# Patient Record
Sex: Female | Born: 1955
Health system: Southern US, Community
[De-identification: ages and names within clinical notes are randomized; demographics above are authoritative.]

## PROBLEM LIST (undated history)

## (undated) DIAGNOSIS — R600 Localized edema: Secondary | ICD-10-CM

## (undated) DIAGNOSIS — R0602 Shortness of breath: Secondary | ICD-10-CM

## (undated) DIAGNOSIS — R0489 Hemorrhage from other sites in respiratory passages: Secondary | ICD-10-CM

## (undated) DIAGNOSIS — I639 Cerebral infarction, unspecified: Secondary | ICD-10-CM

## (undated) DIAGNOSIS — T7840XA Allergy, unspecified, initial encounter: Secondary | ICD-10-CM

## (undated) DIAGNOSIS — F1910 Other psychoactive substance abuse, uncomplicated: Secondary | ICD-10-CM

## (undated) DIAGNOSIS — E876 Hypokalemia: Secondary | ICD-10-CM

## (undated) DIAGNOSIS — E7800 Pure hypercholesterolemia, unspecified: Secondary | ICD-10-CM

## (undated) DIAGNOSIS — I10 Essential (primary) hypertension: Secondary | ICD-10-CM

## (undated) DIAGNOSIS — N184 Chronic kidney disease, stage 4 (severe): Secondary | ICD-10-CM

## (undated) DIAGNOSIS — R200 Anesthesia of skin: Secondary | ICD-10-CM

## (undated) DIAGNOSIS — Z794 Long term (current) use of insulin: Principal | ICD-10-CM

## (undated) DIAGNOSIS — N189 Chronic kidney disease, unspecified: Secondary | ICD-10-CM

## (undated) DIAGNOSIS — Z86010 Personal history of colonic polyps: Secondary | ICD-10-CM

## (undated) DIAGNOSIS — N179 Acute kidney failure, unspecified: Secondary | ICD-10-CM

## (undated) DIAGNOSIS — R042 Hemoptysis: Secondary | ICD-10-CM

## (undated) DIAGNOSIS — D649 Anemia, unspecified: Secondary | ICD-10-CM

## (undated) DIAGNOSIS — M3131 Wegener's granulomatosis with renal involvement: Secondary | ICD-10-CM

## (undated) DIAGNOSIS — J9601 Acute respiratory failure with hypoxia: Secondary | ICD-10-CM

## (undated) DIAGNOSIS — E785 Hyperlipidemia, unspecified: Secondary | ICD-10-CM

## (undated) DIAGNOSIS — E119 Type 2 diabetes mellitus without complications: Secondary | ICD-10-CM

## (undated) DIAGNOSIS — M318 Other specified necrotizing vasculopathies: Secondary | ICD-10-CM

## (undated) DIAGNOSIS — J449 Chronic obstructive pulmonary disease, unspecified: Secondary | ICD-10-CM

## (undated) DIAGNOSIS — L959 Vasculitis limited to the skin, unspecified: Secondary | ICD-10-CM

## (undated) DIAGNOSIS — A419 Sepsis, unspecified organism: Secondary | ICD-10-CM

## (undated) DIAGNOSIS — Z720 Tobacco use: Secondary | ICD-10-CM

## (undated) HISTORY — DX: Type 2 diabetes mellitus without complications: E11.9

## (undated) HISTORY — DX: Anesthesia of skin: R20.0

## (undated) HISTORY — DX: Vasculitis limited to the skin, unspecified: L95.9

## (undated) HISTORY — DX: Hyperlipidemia, unspecified: E78.5

## (undated) HISTORY — DX: Pneumonia, unspecified organism: J18.9

## (undated) HISTORY — DX: Chronic kidney disease, unspecified: N18.9

## (undated) HISTORY — DX: Wegener's granulomatosis with renal involvement: M31.31

## (undated) HISTORY — DX: Sepsis, unspecified organism: A41.9

## (undated) HISTORY — DX: Allergy, unspecified, initial encounter: T78.40XA

## (undated) HISTORY — DX: Cerebral infarction, unspecified: I63.9

## (undated) HISTORY — DX: Essential (primary) hypertension: I10

## (undated) HISTORY — DX: Acute respiratory failure with hypoxia: J96.01

## (undated) HISTORY — DX: Hemorrhage from other sites in respiratory passages: R04.89

## (undated) HISTORY — DX: Hypokalemia: E87.6

## (undated) HISTORY — DX: Hemoptysis: R04.2

## (undated) HISTORY — DX: Chronic kidney disease, stage 4 (severe): N18.4

## (undated) HISTORY — DX: Tobacco use: Z72.0

## (undated) HISTORY — DX: Anemia, unspecified: D64.9

## (undated) HISTORY — DX: Personal history of colonic polyps: Z86.010

## (undated) HISTORY — DX: Other specified necrotizing vasculopathies: M31.8

## (undated) HISTORY — DX: Long term (current) use of insulin: Z79.4

## (undated) HISTORY — DX: Acute kidney failure, unspecified: N17.9

## (undated) HISTORY — DX: Localized edema: R60.0

## (undated) HISTORY — DX: Chronic obstructive pulmonary disease, unspecified: J44.9

## (undated) HISTORY — DX: Other psychoactive substance abuse, uncomplicated: F19.10

## (undated) HISTORY — DX: Shortness of breath: R06.02

---

## 1983-09-11 HISTORY — PX: ABDOMINAL HYSTERECTOMY: SHX81

## 2005-01-25 ENCOUNTER — Emergency Department (HOSPITAL_COMMUNITY): Admission: EM | Admit: 2005-01-25 | Discharge: 2005-01-25 | Payer: Self-pay | Admitting: Emergency Medicine

## 2005-05-01 ENCOUNTER — Ambulatory Visit: Payer: Self-pay | Admitting: Family Medicine

## 2005-05-03 ENCOUNTER — Ambulatory Visit: Payer: Self-pay | Admitting: Family Medicine

## 2005-07-02 ENCOUNTER — Ambulatory Visit: Payer: Self-pay | Admitting: Family Medicine

## 2007-05-26 ENCOUNTER — Emergency Department (HOSPITAL_COMMUNITY): Admission: EM | Admit: 2007-05-26 | Discharge: 2007-05-26 | Payer: Self-pay | Admitting: Emergency Medicine

## 2012-07-01 ENCOUNTER — Inpatient Hospital Stay (HOSPITAL_COMMUNITY)
Admission: EM | Admit: 2012-07-01 | Discharge: 2012-07-04 | DRG: 065 | Disposition: A | Discharge status: Rehabilitation Facility | Payer: Medicaid Other | Attending: Internal Medicine | Admitting: Internal Medicine

## 2012-07-01 ENCOUNTER — Emergency Department (HOSPITAL_COMMUNITY): Payer: Medicaid Other

## 2012-07-01 ENCOUNTER — Encounter (HOSPITAL_COMMUNITY): Payer: Self-pay | Admitting: *Deleted

## 2012-07-01 DIAGNOSIS — R279 Unspecified lack of coordination: Secondary | ICD-10-CM | POA: Diagnosis present

## 2012-07-01 DIAGNOSIS — R270 Ataxia, unspecified: Secondary | ICD-10-CM

## 2012-07-01 DIAGNOSIS — R200 Anesthesia of skin: Secondary | ICD-10-CM

## 2012-07-01 DIAGNOSIS — I639 Cerebral infarction, unspecified: Secondary | ICD-10-CM

## 2012-07-01 DIAGNOSIS — Z23 Encounter for immunization: Secondary | ICD-10-CM

## 2012-07-01 DIAGNOSIS — I6350 Cerebral infarction due to unspecified occlusion or stenosis of unspecified cerebral artery: Principal | ICD-10-CM | POA: Diagnosis present

## 2012-07-01 DIAGNOSIS — R5381 Other malaise: Secondary | ICD-10-CM | POA: Diagnosis present

## 2012-07-01 DIAGNOSIS — R209 Unspecified disturbances of skin sensation: Secondary | ICD-10-CM | POA: Diagnosis present

## 2012-07-01 DIAGNOSIS — F17200 Nicotine dependence, unspecified, uncomplicated: Secondary | ICD-10-CM | POA: Diagnosis present

## 2012-07-01 DIAGNOSIS — R1310 Dysphagia, unspecified: Secondary | ICD-10-CM | POA: Diagnosis present

## 2012-07-01 DIAGNOSIS — I10 Essential (primary) hypertension: Secondary | ICD-10-CM | POA: Diagnosis present

## 2012-07-01 DIAGNOSIS — E876 Hypokalemia: Secondary | ICD-10-CM

## 2012-07-01 HISTORY — DX: Essential (primary) hypertension: I10

## 2012-07-01 LAB — CBC WITH DIFFERENTIAL/PLATELET
Basophils Absolute: 0 10*3/uL (ref 0.0–0.1)
Basophils Relative: 1 % (ref 0–1)
Eosinophils Relative: 0 % (ref 0–5)
Hemoglobin: 14.3 g/dL (ref 12.0–15.0)
Lymphocytes Relative: 25 % (ref 12–46)
MCH: 27.4 pg (ref 26.0–34.0)
MCHC: 33 g/dL (ref 30.0–36.0)
MCV: 83 fL (ref 78.0–100.0)
Monocytes Relative: 5 % (ref 3–12)
Neutro Abs: 4.5 10*3/uL (ref 1.7–7.7)
Neutrophils Relative %: 69 % (ref 43–77)
WBC: 6.5 10*3/uL (ref 4.0–10.5)

## 2012-07-01 LAB — BASIC METABOLIC PANEL
Potassium: 3.9 mEq/L (ref 3.5–5.1)
Sodium: 138 mEq/L (ref 135–145)

## 2012-07-01 MED ORDER — HYDRALAZINE HCL 25 MG PO TABS
25.0000 mg | ORAL_TABLET | Freq: Four times a day (QID) | ORAL | Status: DC
  Administered 2012-07-01 – 2012-07-04 (×7): 25 mg via ORAL
  Filled 2012-07-01 (×14): qty 1

## 2012-07-01 MED ORDER — ACETAMINOPHEN 325 MG PO TABS: 650.0000 mg | ORAL_TABLET | Freq: Four times a day (QID) | ORAL | Status: DC | PRN

## 2012-07-01 MED ORDER — METOPROLOL TARTRATE 1 MG/ML IV SOLN
5.0000 mg | Freq: Once | INTRAVENOUS | Status: AC
  Administered 2012-07-01: 5 mg via INTRAVENOUS
  Filled 2012-07-01: qty 5

## 2012-07-01 MED ORDER — INFLUENZA VIRUS VACC SPLIT PF IM SUSP
0.5000 mL | INTRAMUSCULAR | Status: AC
  Administered 2012-07-02: 0.5 mL via INTRAMUSCULAR
  Filled 2012-07-01: qty 0.5

## 2012-07-01 MED ORDER — HYDRALAZINE HCL 25 MG PO TABS: 25.0000 mg | ORAL_TABLET | Freq: Four times a day (QID) | ORAL | Status: DC

## 2012-07-01 MED ORDER — PNEUMOCOCCAL VAC POLYVALENT 25 MCG/0.5ML IJ INJ
0.5000 mL | INJECTION | INTRAMUSCULAR | Status: AC
  Administered 2012-07-02: 0.5 mL via INTRAMUSCULAR
  Filled 2012-07-01: qty 0.5

## 2012-07-01 MED ORDER — CLONIDINE HCL 0.1 MG PO TABS
0.2000 mg | ORAL_TABLET | Freq: Once | ORAL | Status: AC
  Administered 2012-07-01: 0.2 mg via ORAL
  Filled 2012-07-01: qty 2

## 2012-07-01 MED ORDER — ACETAMINOPHEN 650 MG RE SUPP: 650.0000 mg | Freq: Four times a day (QID) | RECTAL | Status: DC | PRN

## 2012-07-01 MED ORDER — SODIUM CHLORIDE 0.9 % IV SOLN
Freq: Once | INTRAVENOUS | Status: AC
  Administered 2012-07-01: 10:00:00 via INTRAVENOUS

## 2012-07-01 MED ORDER — HYDROCHLOROTHIAZIDE 25 MG PO TABS
25.0000 mg | ORAL_TABLET | Freq: Every day | ORAL | Status: DC
  Administered 2012-07-01 – 2012-07-02 (×2): 25 mg via ORAL
  Filled 2012-07-01 (×2): qty 1

## 2012-07-01 MED ORDER — SODIUM CHLORIDE 0.9 % IJ SOLN
3.0000 mL | Freq: Two times a day (BID) | INTRAMUSCULAR | Status: DC
  Administered 2012-07-01 – 2012-07-04 (×5): 3 mL via INTRAVENOUS

## 2012-07-01 MED ORDER — AMLODIPINE BESYLATE 5 MG PO TABS
5.0000 mg | ORAL_TABLET | Freq: Every day | ORAL | Status: DC
  Administered 2012-07-01 – 2012-07-02 (×2): 5 mg via ORAL
  Filled 2012-07-01 (×2): qty 1

## 2012-07-01 MED ORDER — HYDRALAZINE HCL 25 MG PO TABS
25.0000 mg | ORAL_TABLET | Freq: Four times a day (QID) | ORAL | Status: DC
  Filled 2012-07-01 (×3): qty 1

## 2012-07-01 NOTE — ED Notes (Signed)
Report given to tess, rn on floor.

## 2012-07-01 NOTE — ED Notes (Signed)
md at bedside  Pt alert and oriented x4. Respirations even and unlabored, bilateral symmetrical rise and fall of chest. Skin warm and dry. In no acute distress. Denies needs.   

## 2012-07-01 NOTE — ED Notes (Signed)
[id number] Expected date:<BR> Expected time:<BR> Means of arrival:<BR> Comments:<BR> hypertension

## 2012-07-01 NOTE — ED Notes (Addendum)
Per ptar, pt is from home. Pt reports around 0230 this am, started having left sided facial numbness/tingling on mandible area. Also having left sided weakness, unable to stand and in bed pt leaning to left. Reports her head "pounds and spins". Has not taken medication for HTN in ten years.  Cough x5 days.  Denies ear pain.  Upon arrival to ED pt began vomiting.   Pt husband arrived and reports that her speech has changed and is slow.

## 2012-07-01 NOTE — ED Notes (Signed)
rn and tech attempted to ambulate pt. Pt unable to walk on her own. Still leans to left.

## 2012-07-01 NOTE — ED Notes (Signed)
Pt alert and oriented x4. Respirations even and unlabored, bilateral symmetrical rise and fall of chest. Skin warm and dry. In no acute distress. Denies needs.   

## 2012-07-01 NOTE — ED Provider Notes (Signed)
History     CSN: [id number]  Arrival date & time 07/01/12  0917   First MD Initiated Contact with Patient 07/01/12 [phone]      Chief Complaint  Patient presents with  . Hypertension  . Weakness    (Consider location/radiation/quality/duration/timing/severity/associated sxs/prior treatment) HPI.... level V caveat for urgent need for intervention.  Patient has has had untreated hypertension for several years. Blood pressure home today was 218/100. She has left-sided headache along with leaning to the left when attempting to ambulate. Also vomited. She smokes cigarettes and drinks alcohol. No primary care relationship.  Certain movements makes symptoms worse. Severity is moderate to severe.  Past Medical History  Diagnosis Date  . Hypertension     History reviewed. No pertinent past surgical history.  History reviewed. No pertinent family history.  History  Substance Use Topics  . Smoking status: Current Every Day Smoker -- 0.1 packs/day for 40 years    Types: Cigarettes  . Smokeless tobacco: Not on file  . Alcohol Use: Yes     occasionally    OB History    Grav Para Term Preterm Abortions TAB SAB Ect Mult Living                  Review of Systems  Unable to perform ROS   Allergies  Review of patient's allergies indicates no known allergies.  Home Medications  No current outpatient prescriptions on file.  BP 210/90  Pulse 48  Temp 98 F (36.7 C) (Oral)  Resp 19  SpO2 100%  Physical Exam  Nursing note and vitals reviewed. Constitutional: She is oriented to person, place, and time. She appears well-developed and well-nourished.  HENT:  Head: Normocephalic and atraumatic.  Eyes: Conjunctivae normal and EOM are normal. Pupils are equal, round, and reactive to light.  Neck: Normal range of motion. Neck supple.  Cardiovascular: Normal rate, regular rhythm and normal heart sounds.   Pulmonary/Chest: Effort normal and breath sounds normal.  Abdominal: Soft.  Bowel sounds are normal.  Musculoskeletal: Normal range of motion.  Neurological: She is alert and oriented to person, place, and time.       Leans to the left when attempting to ambulate  Skin: Skin is warm and dry.  Psychiatric: She has a normal mood and affect.    ED Course  Procedures (including critical care time)  Labs Reviewed  CBC WITH DIFFERENTIAL - Abnormal; Notable for the following:    RBC 5.22 (*)     All other components within normal limits  BASIC METABOLIC PANEL - Abnormal; Notable for the following:    Glucose, Bld 170 (*)     All other components within normal limits   Ct Head Wo Contrast  07/01/2012  *RADIOLOGY REPORT*  Clinical Data: Left parietal headaches  CT HEAD WITHOUT CONTRAST  Technique:  Contiguous axial images were obtained from the base of the skull through the vertex without contrast.  Comparison: None.  Findings: Ventricle size is normal.  Negative for infarct or mass. Negative for intracranial hemorrhage or fluid collection.  Calvarium is intact.  Sinusitis with air-fluid levels in the maxillary sinus bilaterally. Mucosal edema and fluid in the sphenoid sinus bilaterally.  There is mucosal thickening in the ethmoid sinuses.  Mastoid sinuses are clear.  IMPRESSION: Normal CT of the brain.  Sinusitis with multiple air-fluid levels in the sinuses.   Original Report Authenticated By: Truett Perna, M.D.      No diagnosis found.    Date: 07/01/2012  Rate: 48  Rhythm: sinus bradycardia  QRS Axis: normal  Intervals: normal  ST/T Wave abnormalities: normal  Conduction Disutrbances:none  Narrative Interpretation:   Old EKG Reviewed: none available    MDM   Patient is alert but very ataxic,   especially leaning to the left.  She's been hypertensive for years.  CT scan is normal. However patient could have had a stroke. Admit to hospital      Nat Christen, MD 07/01/12 1421

## 2012-07-01 NOTE — H&P (Signed)
[facility] History and Physical  Catherine Aguilar [id number] DOB: August 25, 1956 DOA: 07/01/2012  Referring physician: ER physician PCP: No primary provider on file.   Chief Complaint:   HPI:  56 year old female with history of untreated high blood pressure who presented with left sided headache started prior to this admission associated with 1 episode of vomiting. Blood pressure in ED was 218/100 and it responded to catapres and metoprolol  Assessment and Plan:  Active Problems:  Accelerated hypertension - likely due to longstanding untreated hypertension - BP 214/67 on admission and with management in ED BP now 168/87 which is an acceptable decrease; patient was given catapres as well as metoprolol; we will not continue this regimen due to risk of rebound hypertension associated with non compliance and after metoprolol patient developed bradycardia with HR in 40's - we will start Norvasc 5 mg daily, Hctz 25 mg daily and hydralazine 25 mg Q 6 hours PO - this regimen may be changed depending how BP ranges - we will also obtain 2 D ECHO  - admit to telemetry  Code Status: Full Family Communication: Pt at bedside Disposition Plan: Admit for further evaluation; observation on telemtry  Leisa Lenz, MD  [facility] Pager [phone]  If 7PM-7AM, please contact night-coverage [email or web address] Password [email or web address] 07/01/2012, 2:27 PM   Review of Systems:  Constitutional: Negative for fever, chills and malaise/fatigue. Negative for diaphoresis.  HENT: Negative for hearing loss, ear pain, nosebleeds, congestion, sore throat, neck pain, tinnitus and ear discharge.   Eyes: Negative for blurred vision, double vision, photophobia, pain, discharge and redness.  Respiratory: Negative for cough, hemoptysis, sputum production, shortness of breath, wheezing and stridor.   Cardiovascular: Negative for chest pain, palpitations, orthopnea, claudication and leg swelling.  Gastrointestinal: positive for  nausea, vomiting and negative for abdominal pain. Negative for heartburn, constipation, blood in stool and melena.  Genitourinary: Negative for dysuria, urgency, frequency, hematuria and flank pain.  Musculoskeletal: Negative for myalgias, back pain, joint pain and falls.  Skin: Negative for itching and rash.  Neurological: Negative for dizziness and weakness. Negative for tingling, tremors, sensory change, speech change, focal weakness, loss of consciousness Endo/Heme/Allergies: Negative for environmental allergies and polydipsia. Does not bruise/bleed easily.  Psychiatric/Behavioral: Negative for suicidal ideas. The patient is not nervous/anxious.      Past Medical History  Diagnosis Date  . Hypertension    Past Surgical History  Procedure Date  . Abdominal hysterectomy 1985   Social History:  reports that she has been smoking Cigarettes.  She has a 4 pack-year smoking history. She has never used smokeless tobacco. She reports that she drinks alcohol. She reports that she does not use illicit drugs.  No Known Allergies  Family History: htn in mother  Prior to Admission medications   Not on File   Physical Exam: Filed Vitals:   07/01/12 0935 07/01/12 1015 07/01/12 1100 07/01/12 1203  BP: 214/97 210/90 171/92 168/87  Pulse: 50 48 45 44  Temp:      TempSrc:      Resp: 18 19 16 14   SpO2: 100%  99% 99%    Physical Exam  Constitutional: Appears well-developed and well-nourished. No distress.  HENT: Normocephalic. External right and left ear normal. Oropharynx is clear and moist.  Eyes: Conjunctivae and EOM are normal. PERRLA, no scleral icterus.  Neck: Normal ROM. Neck supple. No JVD. No tracheal deviation. No thyromegaly.  CVS: RRR, S1/S2 +, no murmurs, no gallops, no carotid bruit.  Pulmonary: Effort and breath sounds  normal, no stridor, rhonchi, wheezes, rales.  Abdominal: Soft. BS +,  no distension, tenderness, rebound or guarding.  Musculoskeletal: Normal range of  motion. No edema and no tenderness.  Lymphadenopathy: No lymphadenopathy noted, cervical, inguinal. Neuro: Alert. Normal reflexes, muscle tone coordination. No cranial nerve deficit. Skin: Skin is warm and dry. No rash noted. Not diaphoretic. No erythema. No pallor.  Psychiatric: Normal mood and affect. Behavior, judgment, thought content normal.   Labs on Admission:  Basic Metabolic Panel:  Lab [id number] 0950  NA 138  K 3.9  CL 102  CO2 20  GLUCOSE 170*  BUN 17  CREATININE 0.75  CALCIUM 9.8  MG --  PHOS --   Liver Function Tests: No results found for this basename: AST:5,ALT:5,ALKPHOS:5,BILITOT:5,PROT:5,ALBUMIN:5 in the last 168 hours No results found for this basename: LIPASE:5,AMYLASE:5 in the last 168 hours No results found for this basename: AMMONIA:5 in the last 168 hours CBC:  Lab 07/01/12 0950  WBC 6.5  NEUTROABS 4.5  HGB 14.3  HCT 43.3  MCV 83.0  PLT 340   Cardiac Enzymes: No results found for this basename: CKTOTAL:5,CKMB:5,CKMBINDEX:5,TROPONINI:5 in the last 168 hours BNP: No components found with this basename: POCBNP:5 CBG: No results found for this basename: GLUCAP:5 in the last 168 hours  Radiological Exams on Admission: Ct Head Wo Contrast  07/01/2012  *RADIOLOGY REPORT*  Clinical Data: Left parietal headaches  CT HEAD WITHOUT CONTRAST  Technique:  Contiguous axial images were obtained from the base of the skull through the vertex without contrast.  Comparison: None.  Findings: Ventricle size is normal.  Negative for infarct or mass. Negative for intracranial hemorrhage or fluid collection.  Calvarium is intact.  Sinusitis with air-fluid levels in the maxillary sinus bilaterally. Mucosal edema and fluid in the sphenoid sinus bilaterally.  There is mucosal thickening in the ethmoid sinuses.  Mastoid sinuses are clear.  IMPRESSION: Normal CT of the brain.  Sinusitis with multiple air-fluid levels in the sinuses.   Original Report Authenticated By: Truett Perna, M.D.     EKG: Normal sinus rhythm, no ST/T wave changes  Time spent: 60 minutes

## 2012-07-01 NOTE — ED Notes (Signed)
First attempt to call report to tess, rn. rn will call back

## 2012-07-01 NOTE — ED Notes (Signed)
Pt to CT

## 2012-07-02 ENCOUNTER — Observation Stay (HOSPITAL_COMMUNITY): Payer: Medicaid Other

## 2012-07-02 DIAGNOSIS — E876 Hypokalemia: Secondary | ICD-10-CM | POA: Diagnosis not present

## 2012-07-02 DIAGNOSIS — R209 Unspecified disturbances of skin sensation: Secondary | ICD-10-CM

## 2012-07-02 DIAGNOSIS — R200 Anesthesia of skin: Secondary | ICD-10-CM

## 2012-07-02 DIAGNOSIS — R269 Unspecified abnormalities of gait and mobility: Secondary | ICD-10-CM

## 2012-07-02 DIAGNOSIS — R279 Unspecified lack of coordination: Secondary | ICD-10-CM

## 2012-07-02 HISTORY — DX: Hypokalemia: E87.6

## 2012-07-02 HISTORY — DX: Anesthesia of skin: R20.0

## 2012-07-02 LAB — CBC
MCH: 27 pg (ref 26.0–34.0)
MCHC: 32.8 g/dL (ref 30.0–36.0)
Platelets: 396 10*3/uL (ref 150–400)
RBC: 4.81 MIL/uL (ref 3.87–5.11)
RDW: 14.2 % (ref 11.5–15.5)

## 2012-07-02 LAB — COMPREHENSIVE METABOLIC PANEL
ALT: 16 U/L (ref 0–35)
AST: 13 U/L (ref 0–37)
Alkaline Phosphatase: 67 U/L (ref 39–117)
CO2: 25 mEq/L (ref 19–32)
Chloride: 99 mEq/L (ref 96–112)
Creatinine, Ser: 0.84 mg/dL (ref 0.50–1.10)
GFR calc non Af Amer: 76 mL/min — ABNORMAL LOW (ref >90)
Total Bilirubin: 0.3 mg/dL (ref 0.3–1.2)

## 2012-07-02 LAB — GLUCOSE, CAPILLARY: Glucose-Capillary: 115 mg/dL — ABNORMAL HIGH (ref 70–99)

## 2012-07-02 MED ORDER — POTASSIUM CHLORIDE CRYS ER 20 MEQ PO TBCR
40.0000 meq | EXTENDED_RELEASE_TABLET | ORAL | Status: AC
  Administered 2012-07-02: 40 meq via ORAL
  Filled 2012-07-02 (×2): qty 2

## 2012-07-02 MED ORDER — SODIUM CHLORIDE 0.9 % IV SOLN
INTRAVENOUS | Status: DC
  Administered 2012-07-02 – 2012-07-03 (×2): via INTRAVENOUS

## 2012-07-02 MED ORDER — POTASSIUM CHLORIDE 10 MEQ/100ML IV SOLN
10.0000 meq | INTRAVENOUS | Status: AC
  Administered 2012-07-02 – 2012-07-03 (×4): 10 meq via INTRAVENOUS
  Filled 2012-07-02 (×4): qty 100

## 2012-07-02 MED ORDER — HYDRALAZINE HCL 20 MG/ML IJ SOLN
10.0000 mg | Freq: Four times a day (QID) | INTRAMUSCULAR | Status: DC | PRN
  Administered 2012-07-04: 10 mg via INTRAVENOUS
  Filled 2012-07-02: qty 1

## 2012-07-02 MED ORDER — GADOBENATE DIMEGLUMINE 529 MG/ML IV SOLN
15.0000 mL | Freq: Once | INTRAVENOUS | Status: AC | PRN
  Administered 2012-07-02: 15 mL via INTRAVENOUS

## 2012-07-02 MED ORDER — ASPIRIN 325 MG PO TABS
325.0000 mg | ORAL_TABLET | Freq: Every day | ORAL | Status: DC
  Filled 2012-07-02: qty 1

## 2012-07-02 MED ORDER — ENOXAPARIN SODIUM 40 MG/0.4ML ~~LOC~~ SOLN
40.0000 mg | SUBCUTANEOUS | Status: DC
  Administered 2012-07-02 – 2012-07-04 (×3): 40 mg via SUBCUTANEOUS
  Filled 2012-07-02 (×3): qty 0.4

## 2012-07-02 MED ORDER — ASPIRIN 300 MG RE SUPP
325.0000 mg | Freq: Every day | RECTAL | Status: DC
  Administered 2012-07-02: 300 mg via RECTAL
  Filled 2012-07-02 (×4): qty 1

## 2012-07-02 NOTE — Consult Note (Signed)
Reason for Consult: Stroke Referring Physician: Bonnielee Haff  CC: Difficulty walking  History is obtained from: Patient  HPI: Catherine Aguilar is an 56 y.o. female with a history of left-sided headache and unsteadiness that began around 2 AM on the 22nd. She began vomiting later that morning, which prompted her to come in to the ER with the first note being around 9:15 AM. She was found to have a grossly elevated blood pressure and was therefore admitted for accelerated hypertension. He has been started on Norvasc, hydrochlorothiazide, hydralazine. Her current blood pressure is [id number] systolic. She has been as low as 0000000 systolic, without worsening of her symptoms.   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 She states that when she tries to walk, she consistently falls to the left   ROS: An 11 point ROS was performed and is negative except as noted in the HPI.  Past Medical History  Diagnosis Date  . Hypertension     Family History: No history of strokes that she is aware of  Social History: Tob: Does smoke  Exam: Current vital signs: BP 156/69  Pulse 65  Temp 98.2 F (36.8 C) (Oral)  Resp 18  Ht 5\' 9"  (1.753 m)  Wt 75 kg (165 lb 5.5 oz)  BMI 24.42 kg/m2  SpO2 100% Vital signs in last 24 hours: Temp:  [98.1 F (36.7 C)-98.7 F (37.1 C)] 98.2 F (36.8 C) (10/23 1406) Pulse Rate:  [45-65] 65  (10/23 1406) Resp:  [18-20] 18  (10/23 1406) BP: (128-203)/(67-87) 156/69 mmHg (10/23 1406) SpO2:  [100 %] 100 % (10/23 1406) Weight:  [75 kg (165 lb 5.5 oz)] 75 kg (165 lb 5.5 oz) (10/23 0451)  General: In bed, no apparent distress CV: Regular rate and rhythm Mental Status: Patient is awake, alert, oriented to person, place, month, year, and situation. Patient is able to add 7+, able to spell world backwards without difficulty. Patient is able to give a clear and coherent history. Cranial Nerves: II: Visual Fields are full. Pupils are equal, round,  and reactive to light.  Discs are difficult to visualiz. III,IV, VI: EOMI without ptosis or diploplia. She has saccadic smooth pursuit towards the right.  V,VII: Facial movement is symmetric. Facial sensation is decreased to temperature on the left face VIII: hearing is intact to voice X: Uvula elevates symmetrically XI: Shoulder shrug is symmetric. XII: tongue is midline without atrophy or fasciculations.  Motor: Tone is normal. Bulk is normal. 5/5 strength was present in all four extremities.  Sensory: Sensation is diminished in the right arm and right leg, intact in the left arm and left leg Deep Tendon Reflexes: 3+ and symmetric in the biceps and patellae.  Plantars: Toes are downgoing bilaterally.  Cerebellar: FNF with gross dysmetria on left, intact on right.  Gait: On standing, patient immediately falls towards the left.   I have reviewed labs in epic and the results pertinent to this consultation are: CMP< low K, otherwise unremarkable.   I have reviewed the images obtained: MRI brain, small left lateral medullary infarct  Impression: 56 yo F with sudden onset headache, ataxia, and nausea consistent with lateral medullary infarct. Given the unilateral headache at onset, I would perform vascular imaging to rule out dissection, evaluate for atherosclerotic burden.   Recommendations: 1. HgbA1c, fasting lipid panel 2. MRA of the brain without contrast, MRA neck with contrast 3. PT consult, OT consult, Speech consult 4. Echocardiogram results pending 5. Prophylactic therapy-Antiplatelet med:  Aspirin - dose 300mg .  6. Risk factor modification 7. Telemetry monitoring 8. Frequent neuro checks  Roland Rack, MD [facility] [phone]  If 7pm- 7am, please page neurology on call at [phone].

## 2012-07-02 NOTE — Progress Notes (Signed)
Talked to patient about follow up medical care; A packet was given to patient with information on health care providers that are accepting new patient, the [employer], [facility] and [facility]; [facility] or [facility] to [facility] her prescriptions and is aware of the $4.00 medications assistance program.  Patient is independent prior to admission, is going to [employer] part time. Patient stated that she could pay for her medication/ prescriptions as long as they are on the $4.00 list. B Tamera Punt RN,BSN,MHA

## 2012-07-02 NOTE — Progress Notes (Signed)
VASCULAR LAB PRELIMINARY  PRELIMINARY  PRELIMINARY  PRELIMINARY  Carotid duplex  completed.    Preliminary report:  Bilateral:  No evidence of hemodynamically significant internal carotid artery stenosis.   Vertebral artery flow is antegrade.      Chong January, RVT 07/02/2012, 12:57 PM

## 2012-07-02 NOTE — Progress Notes (Signed)
Patient has very unsteady gait issue. Stands at the bedside to get onto bedside commode and simply falls into RN standing beside her. Patient verbalizes understanding to not get out of bed without assistance and bed alarm is set. Will continue to monitor. Lemar Livings

## 2012-07-02 NOTE — Progress Notes (Addendum)
PCP: Does not have a PCP  Brief HPI: 56 year old female with history of untreated high blood pressure who presented with left sided headache started prior to this admission associated with 1 episode of vomiting. Blood pressure in ED was 218/100 and it responded to catapres and metoprolol. Patient also had left sided numbness and weakness.  Past medical history:  Past Medical History  Diagnosis Date  . Hypertension     Consultants: None  Procedures: None  Subjective: Patient still with numbness to the left face. Denies any weakness or numbness in left arm or leg. Feels unsteady with walking. Denies new difficulty with swallowing.  Objective: Vital signs in last 24 hours: Temp:  [98.1 F (36.7 C)-98.7 F (37.1 C)] 98.1 F (36.7 C) (10/23 0451) Pulse Rate:  [45-50] 50  (10/23 0451) Resp:  [20] 20  (10/23 0451) BP: (128-203)/(67-87) 128/67 mmHg (10/23 1000) SpO2:  [98 %-100 %] 100 % (10/23 0451) Weight:  [75 kg (165 lb 5.5 oz)-75.932 kg (167 lb 6.4 oz)] 75 kg (165 lb 5.5 oz) (10/23 0451) Weight change:  Last BM Date: 07/01/12  Intake/Output from previous day: 10/22 0701 - 10/23 0700 In: 170.3 [I.V.:170.3] Out: -  Intake/Output this shift: Total I/O In: -  Out: 800 [Urine:800]  General appearance: alert, cooperative, appears stated age and no distress Head: Normocephalic, without obvious abnormality, atraumatic Eyes: conjunctivae/corneas clear. PERRL, EOM's intact.  Neck: no adenopathy, no carotid bruit, no JVD, supple, symmetrical, trachea midline and thyroid not enlarged, symmetric, no tenderness/mass/nodules Resp: clear to auscultation bilaterally Cardio: regular rate and rhythm, S1, S2 normal, no murmur, click, rub or gallop GI: soft, non-tender; bowel sounds normal; no masses,  no organomegaly Extremities: extremities normal, atraumatic, no cyanosis or edema Pulses: 2+ and symmetric Skin: Skin color, texture, turgor normal. No rashes or lesions Lymph nodes:  Cervical, supraclavicular, and axillary nodes normal. Neurologic: Alert and oriented x 3. Decreased sensation left face. Strength equal bilaterally.  Lab Results:  Basename 07/02/12 0525 07/01/12 0950  WBC 7.4 6.5  HGB 13.0 14.3  HCT 39.6 43.3  PLT 396 340   BMET  Basename 07/02/12 0525 07/01/12 0950  NA 135 138  K 3.0* 3.9  CL 99 102  CO2 25 20  GLUCOSE 105* 170*  BUN 11 17  CREATININE 0.84 0.75  CALCIUM 9.7 9.8  ALT 16 --    Studies/Results: Ct Head Wo Contrast  07/01/2012  *RADIOLOGY REPORT*  Clinical Data: Left parietal headaches  CT HEAD WITHOUT CONTRAST  Technique:  Contiguous axial images were obtained from the base of the skull through the vertex without contrast.  Comparison: None.  Findings: Ventricle size is normal.  Negative for infarct or mass. Negative for intracranial hemorrhage or fluid collection.  Calvarium is intact.  Sinusitis with air-fluid levels in the maxillary sinus bilaterally. Mucosal edema and fluid in the sphenoid sinus bilaterally.  There is mucosal thickening in the ethmoid sinuses.  Mastoid sinuses are clear.  IMPRESSION: Normal CT of the brain.  Sinusitis with multiple air-fluid levels in the sinuses.   Original Report Authenticated By: Truett Perna, M.D.     Medications:  Scheduled:    . hydrALAZINE  25 mg Oral Q6H  . hydrochlorothiazide  25 mg Oral Daily  . influenza  inactive virus vaccine  0.5 mL Intramuscular Tomorrow-1000  . pneumococcal 23 valent vaccine  0.5 mL Intramuscular Tomorrow-1000  . potassium chloride  40 mEq Oral Q4H  . sodium chloride  3 mL Intravenous Q12H  .  DISCONTD: amLODipine  5 mg Oral Daily  . DISCONTD: hydrALAZINE  25 mg Oral Q6H  . DISCONTD: hydrALAZINE  25 mg Oral Q6H   Continuous:  [id number], acetaminophen  Assessment/Plan:  Active Problems:  Accelerated hypertension  Hypokalemia  Left facial numbness    Accelerated hypertension  Was likely due to longstanding untreated hypertension. BP  is now better. Adjust medications. Await 2 D ECHO   Left Facial Numbness  Could be related to accelerated HTN. But will proceed with MRI to rule out CVA. Get PT/OT eval due to unsteady gait. Has not taken aspirin since January.  Hypokalemia Replete orally  Code Status Full   Family Communication Discussed with patient and husband at bedside.  Disposition Plan Will likely go home when ready for discharge  DVT Prophylaxis Enoxaparin   LOS: 1 day   [facility] Pager [phone] 07/02/2012, 12:28 PM   MRI reveals Acute CVA. Discussed with Dr. Doy Mince and she will see the patient. Will initiate Aspirin. Don't aggressively control BP. SLP for swallow eval. Carotids and ECHO.  Timmi Devora 4:50 PM

## 2012-07-02 NOTE — Progress Notes (Signed)
  Echocardiogram 2D Echocardiogram has been performed.  Catherine Aguilar 07/02/2012, 11:27 AM

## 2012-07-03 ENCOUNTER — Encounter (HOSPITAL_COMMUNITY): Payer: Self-pay | Admitting: Physical Medicine and Rehabilitation

## 2012-07-03 ENCOUNTER — Observation Stay (HOSPITAL_COMMUNITY): Payer: Medicaid Other

## 2012-07-03 DIAGNOSIS — I639 Cerebral infarction, unspecified: Secondary | ICD-10-CM | POA: Diagnosis present

## 2012-07-03 DIAGNOSIS — I6350 Cerebral infarction due to unspecified occlusion or stenosis of unspecified cerebral artery: Principal | ICD-10-CM

## 2012-07-03 HISTORY — DX: Cerebral infarction, unspecified: I63.9

## 2012-07-03 LAB — GLUCOSE, CAPILLARY: Glucose-Capillary: 101 mg/dL — ABNORMAL HIGH (ref 70–99)

## 2012-07-03 LAB — LIPID PANEL
HDL: 39 mg/dL — ABNORMAL LOW (ref >39)
LDL Cholesterol: 98 mg/dL (ref 0–99)
VLDL: 24 mg/dL (ref 0–40)

## 2012-07-03 LAB — BASIC METABOLIC PANEL
BUN: 10 mg/dL (ref 6–23)
Calcium: 9.7 mg/dL (ref 8.4–10.5)
Creatinine, Ser: 0.9 mg/dL (ref 0.50–1.10)
GFR calc Af Amer: 81 mL/min — ABNORMAL LOW (ref >90)
GFR calc non Af Amer: 70 mL/min — ABNORMAL LOW (ref >90)

## 2012-07-03 LAB — HEMOGLOBIN A1C: Hgb A1c MFr Bld: 6.4 % — ABNORMAL HIGH (ref <5.7)

## 2012-07-03 MED ORDER — FLUTICASONE PROPIONATE 50 MCG/ACT NA SUSP
1.0000 | Freq: Every day | NASAL | Status: DC
  Administered 2012-07-03 – 2012-07-04 (×2): 1 via NASAL
  Filled 2012-07-03: qty 16

## 2012-07-03 MED ORDER — ASPIRIN 325 MG PO TABS
325.0000 mg | ORAL_TABLET | Freq: Every day | ORAL | Status: DC
  Administered 2012-07-03 – 2012-07-04 (×2): 325 mg via ORAL
  Filled 2012-07-03 (×2): qty 1

## 2012-07-03 MED ORDER — SIMVASTATIN 20 MG PO TABS
20.0000 mg | ORAL_TABLET | Freq: Every day | ORAL | Status: DC
  Administered 2012-07-03: 20 mg via ORAL
  Filled 2012-07-03 (×2): qty 1

## 2012-07-03 NOTE — Evaluation (Addendum)
Occupational Therapy Evaluation Patient Details Name: Catherine Aguilar MRN: [id number] DOB: [birth year] Today's Date: 07/03/2012 Time: [id number] OT Time Calculation (min): 24 min  OT Assessment / Plan / Recommendation Clinical Impression  56 yo female admitted with accelerated hypertension. 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 Pt was independent prior to admission. She displays decreased safety judgement, impulsivity, decreased awareness of need for assist, balance deficits during ADL and will benefit from skilled OT services to improve ADL independence and safety for next venue of care.     OT Assessment  Patient needs continued OT Services    Follow Up Recommendations  Inpatient Rehab    Barriers to Discharge      Equipment Recommendations  Rolling walker with 5" wheels;3 in 1 bedside comode    Recommendations for Other Services Rehab consult  Frequency  Min 2X/week    Precautions / Restrictions Precautions Precautions: Fall Restrictions Weight Bearing Restrictions: No        ADL  Eating/Feeding: Simulated;Independent Where Assessed - Eating/Feeding: Chair Grooming: Simulated;Wash/dry hands;Set up Where Assessed - Grooming: Supported sitting Upper Body Bathing: Simulated;Chest;Right arm;Left arm;Abdomen;Supervision/safety;Other (comment);Set up (supervision for safety as pt impulsive) Where Assessed - Upper Body Bathing: Unsupported sitting Lower Body Bathing: Simulated;Minimal assistance;Other (comment) (for balance, pt with LOB to L) Where Assessed - Lower Body Bathing: Supported sit to stand Upper Body Dressing: Simulated;Supervision/safety;Set up Where Assessed - Upper Body Dressing: Unsupported sitting Lower Body Dressing: Simulated;Minimal assistance;Other (comment) (as above for balance. ) Where Assessed - Lower Body Dressing: Supported sit to stand Toilet Transfer: Performed;Minimal assistance Toilet Transfer Method: Stand Engineer, civil (consulting): Bedside commode;Other (comment) (pt about to get up to [facility] on her own. ) Toileting - Water quality scientist and Hygiene: Simulated;Minimal assistance Where Assessed - Best boy and Hygiene: Sit to stand from 3-in-1 or toilet Tub/Shower Transfer Method: Not assessed Equipment Used: Rolling walker ADL Comments: Seen with PT. Initially didnt use an assistive device and pt required min to mod assist for balance with LOB to the L. Used RW for functional mobility and pt with improved balance requiring min assist. Pt is impulsive and moves quickly. She states she is used to doing things "fast" and was attempting to get up to [facility] by herself as therapists entered the room . Emphasized the need for her to call for assist due to her balance deficits. Pt requires min assist for dynamic balance tasks such as pulling up and down mesh underwear.     OT Diagnosis: Ataxia;Cognitive deficits;Generalized weakness  OT Problem List: Impaired balance (sitting and/or standing);Decreased cognition;Decreased safety awareness;Decreased knowledge of use of DME or AE OT Treatment Interventions: Self-care/ADL training;Therapeutic activities;DME and/or AE instruction;Patient/family education;Balance training   OT Goals Acute Rehab OT Goals OT Goal Formulation: With patient Time For Goal Achievement: 07/17/12 Potential to Achieve Goals: Good ADL Goals Pt Will Perform Grooming: with supervision;Standing at sink;Other (comment) (3 tasks) ADL Goal: Grooming - Progress: Goal set today Pt Will Perform Upper Body Bathing: with supervision;Other (comment);Unsupported;Sitting, edge of bed;Sitting, chair (own setup with assistive device) ADL Goal: Upper Body Bathing - Progress: Goal set today Pt Will Perform Lower Body Bathing: with supervision;Sit to stand from chair;Sit to stand from bed;Other (comment) (own setup) ADL Goal: Lower Body Bathing - Progress: Goal set today Pt Will Perform Upper Body  Dressing: with supervision;Unsupported;Sitting, bed;Sitting, chair;Other (comment) (own setup) ADL Goal: Upper Body Dressing - Progress: Goal set today Pt Will Perform Lower Body Dressing: with supervision;Sit to stand from chair;Sit to  stand from bed;Other (comment) (own setup) ADL Goal: Lower Body Dressing - Progress: Goal set today Pt Will Transfer to Toilet: with supervision;Ambulation;with DME;3-in-1 ADL Goal: Toilet Transfer - Progress: Goal set today Pt Will Perform Toileting - Clothing Manipulation: with supervision;Standing ADL Goal: Toileting - Clothing Manipulation - Progress: Goal set today Pt Will Perform Toileting - Hygiene: with supervision;Standing at 3-in-1/toilet ADL Goal: Toileting - Hygiene - Progress: Goal set today Additional ADL Goal #1: Pt will demonstrate good safety awareness during ADL session with only 1 verbal cue during session (awareness of trip hazards, recognize need for assist, etc) ADL Goal: Additional Goal #1 - Progress: Goal set today  Visit Information  Last OT Received On: 07/03/12 Assistance Needed: +1 PT/OT Co-Evaluation/Treatment: Yes    Subjective Data  Subjective: I need to go to the bathroom Patient Stated Goal: wants to be independent again   Prior Functioning     Home Living Lives With: Spouse Available Help at Discharge: Other (Comment) (spouse on disability) Type of Home: House Home Access: Stairs to enter [employer] of Steps: 1 Home Layout: One level Home Adaptive Equipment: None Prior Function Level of Independence: Independent Able to Take Stairs?: Yes Communication Communication: No difficulties Dominant Hand: Right         Vision/Perception  Pt reports vision is "alittle blurry" compared to baseline but able to read clock, etc. Pt states she had some diplopia but today reports none.   Cognition  Overall Cognitive Status: Impaired Area of Impairment: Safety/judgement Arousal/Alertness:  Awake/alert Orientation Level: Appears intact for tasks assessed Behavior During Session: [facility] for tasks performed Safety/Judgement: Decreased awareness of safety precautions;Decreased safety judgement for tasks assessed;Impulsive;Decreased awareness of need for assistance Cognition - Other Comments: Pt trying to get up on her own when therapists entered room and observed multiple cords under feet and SCDs still connected. Emphasized need to call for assist. Pt is impulsive and moves quickly.     Extremity/Trunk Assessment Right Upper Extremity Assessment RUE ROM/Strength/Tone: WFL for tasks assessed RUE Sensation: WFL - Light Touch RUE Coordination: WFL - gross/fine motor Left Upper Extremity Assessment LUE ROM/Strength/Tone: WFL for tasks assessed LUE Sensation: WFL - Light Touch LUE Coordination: Deficits LUE Coordination Deficits: pt able to touch each finger to thumb with good speed but note dysmetria with finger to nose on L with some decreased accuracy noted. Better with practice.  Right Lower Extremity Assessment RLE ROM/Strength/Tone: [facility] for tasks assessed RLE Sensation: Deficits RLE Sensation Deficits: Impaired perception of body in space. Pt attempts to compensate with lateral head/neck flexion to R side and by leaning to R side, at times overcompensating.  RLE Coordination: Deficits RLE Coordination Deficits: Ataxic gait. Left Lower Extremity Assessment LLE ROM/Strength/Tone: WFL for tasks assessed LLE Sensation: Deficits LLE Sensation Deficits: Impaired perception of body in space. Pt attempts to compensate with lateral head/neck flexion to R side and by leaning to R side, at times overcompensating.  LLE Coordination: Deficits LLE Coordination Deficits: Ataxic gait. Trunk Assessment Trunk Assessment: Normal     Mobility Bed Mobility Bed Mobility: Supine to Sit Supine to Sit: 5: Supervision Details for Bed Mobility Assistance: VCs safety. Pt is impulsive.   Transfers Sit to Stand: 4: Min assist;From bed;With upper extremity assist;From chair/3-in-1 Stand to Sit: 4: Min assist;To chair/3-in-1;With armrests;With upper extremity assist Details for Transfer Assistance: VCs safety, technique, hand placement. Assist to rise stabilize, control descent. Pt very unsteady, reaching out with both hands for objects to hold onto. LOB to L side multiple  times.      Shoulder Instructions     Exercise     Balance Balance Balance Assessed: Yes Static Standing Balance Static Standing - Balance Support: No upper extremity supported Static Standing - Level of Assistance: 4: Min assist Static Standing - Comment/# of Minutes: Had pt stand EO/EC-able to maintain without LOB with close supervision assist. Performed external perturbations, mulitidirectional-close supervision assist. Pt attempted to stand statically with narrow BOS-pt unable to achieve position without 1 hand assist. Unable to maintain-LOB to L side repeatedly, requiring Min assist to prevent fall.  Dynamic Standing Balance Dynamic Standing - Level of Assistance: 4: Min assist;Other (comment) (to pull up and down mesh underwear)   End of Session OT - End of Session Equipment Utilized During Treatment: Gait belt Activity Tolerance: Patient tolerated treatment well Patient left: in chair;with call bell/phone within reach;with chair alarm set  GO Functional Assessment Tool Used: clinical judgement Functional Limitation: Self care Self Care Current Status [id number]): At least 20 percent but less than 40 percent impaired, limited or restricted Self Care Goal Status [id number]): At least 1 percent but less than 20 percent impaired, limited or restricted   Jules Schick [id number] 07/03/2012, 1:01 PM

## 2012-07-03 NOTE — Progress Notes (Addendum)
Speech Language Pathology Dysphagia Treatment Patient Details Name: Catherine Aguilar MRN: [id number] DOB: Jan 20, 1956 Today's Date: 07/03/2012 Time: [id number] SLP Time Calculation (min): 26 min  Assessment / Plan / Recommendation Clinical Impression  Pt seen for education regarding MBS findings, compensatory strategy review and  initiation of exercises to improve hyolaryngeal elevation.  In addition, SLP administered reflux symptom index (RSI) to pt - Her score was 31 of 45 indicating high likelihood of pt experiencing laryngopharyngeal reflux (LPR) that may contriburte to dysphagia symptoms.  Provided list of reflux precautions for pt to incorporate and RSI for rescreening monthly.  SLP demonstrated Shaker swallow exercise and modified hyolaryngeal elevation exercise for pt to complete.  Pt returned demonstration with minimal verbal cues for Shaker and moderate visual and verbal cues for modified version.  Pt re-stated swallow precautions for airway protection.   SLP to follow for dysphagia treatment and management.    Note:  Given pt with apparent awareness, safety judgement difficulties with OT/PT, pt may benefit from cog-ling evaluation/tx with SLP to help maximize safety.  MD please order if you agree.  Thanks.      Diet Recommendation  Initiate / Change Diet: Dysphagia 3 (mechanical soft);Thin liquid    SLP Plan Continue with current plan of care   Pertinent Vitals/Pain Pt reports good intake of lunch without difficulties.     Swallowing Goals  SLP Swallowing Goals Goal #3: Pt will complete hyolaryngeal elevation exercises to improve airway protection while eating swallowing with modified independence.    General Temperature Spikes Noted: No Respiratory Status: Room air Behavior/Cognition: Alert;Cooperative;Pleasant mood Oral Cavity - Dentition: Adequate natural dentition;Poor condition Patient Positioning: Upright in bed    Dysphagia Treatment Treatment focused on:  Patient/family/caregiver education Family/Caregiver Educated: pt Treatment Methods/Modalities: Shaker exercise;Differential diagnosis Patient observed directly with PO's: No Reason PO's not observed: Other (comment) (education session- ) Feeding: Able to feed self (intermittent supervison recommended)   [place], [place] [facility] SLP [phone]

## 2012-07-03 NOTE — Evaluation (Signed)
Physical Therapy Evaluation Patient Details Name: Catherine Aguilar MRN: [id number] DOB: Jan 23, 1956 Today's Date: 07/03/2012 Time: [id number] PT Time Calculation (min): 24 min  PT Assessment / Plan / Recommendation Clinical Impression  56 yo female admitted with accelerated hypertension. 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 Pt was independent prior to admission. On eval pt demonstrates gait and balance deficits. Requires Min-Mod assist for ambulation. Pt demonstrates high risk for falls as well as impaired safety awareness and impulsivity. Feel pt would benefit greatly from CIR consult. Pt will need continued rehab to maximize independence and safety with functional mobility.     PT Assessment  Patient needs continued PT services    Follow Up Recommendations  Post acute inpatient    Does the patient have the potential to tolerate intense rehabilitation   Yes, Recommend IP Rehab Screening  Barriers to Discharge        Equipment Recommendations  Rolling walker with 5" wheels    Recommendations for Other Services OT consult   Frequency Min 4X/week    Precautions / Restrictions Precautions Precautions: Fall Restrictions Weight Bearing Restrictions: No   Pertinent Vitals/Pain Pt denies      Mobility  Bed Mobility Bed Mobility: Supine to Sit Supine to Sit: 5: Supervision Details for Bed Mobility Assistance: VCs safety. Pt is impulsive.  Transfers Transfers: Sit to Stand;Stand to [employer] Transfers Sit to Stand: 4: Min assist;From bed;With upper extremity assist Stand to Sit: 4: Min assist;To chair/3-in-1;With armrests;With upper extremity assist Details for Transfer Assistance: VCs safety, technique, hand placement. Assist to rise stabilize, control descent. Pt very unsteady, reaching out with both hands for objects to hold onto. LOB to L side multiple times.  Ambulation/Gait Ambulation/Gait Assistance: 3: Mod assist;4: Min assist Ambulation Distance  (Feet): 200 Feet Assistive device: Rolling walker;None Ambulation/Gait Assistance Details: Attempted ambulation without assistive device-Mod assist with repeated LOB to L side. Had pt ambulate with RW-Min A with intermittent scissoring, LOB to L side. Assist to stabilize throughout ambulation and for maneuvering with RW. VCs safety, pacing, distance/position from RW.  Gait Pattern: Step-through pattern;Ataxic;Scissoring;Decreased stride length;Wide base of support    Shoulder Instructions     Exercises     PT Diagnosis: Difficulty walking;Abnormality of gait  PT Problem List: Decreased balance;Decreased mobility;Decreased coordination;Decreased safety awareness;Decreased knowledge of use of DME PT Treatment Interventions: DME instruction;Gait training;Stair training;Functional mobility training;Therapeutic activities;Balance training;Therapeutic exercise;Patient/family education   PT Goals Acute Rehab PT Goals PT Goal Formulation: With patient Time For Goal Achievement: 07/17/12 Potential to Achieve Goals: Good Pt will go Supine/Side to Sit: with modified independence PT Goal: Supine/Side to Sit - Progress: Goal set today Pt will go Sit to Supine/Side: with modified independence PT Goal: Sit to Supine/Side - Progress: Goal set today Pt will go Sit to Stand: with supervision PT Goal: Sit to Stand - Progress: Goal set today Pt will Transfer Bed to Chair/Chair to Bed: with supervision PT Transfer Goal: Bed to Chair/Chair to Bed - Progress: Goal set today Pt will Ambulate: >150 feet;with supervision;with least restrictive assistive device PT Goal: Ambulate - Progress: Goal set today Pt will Go Up / Down Stairs: 1-2 stairs;with supervision;with least restrictive assistive device PT Goal: Up/Down Stairs - Progress: Goal set today  Visit Information  Last PT Received On: 07/03/12 Assistance Needed: +2 PT/OT Co-Evaluation/Treatment: Yes    Subjective Data  Subjective: "I was independent.  I gotta get back home" Patient Stated Goal: Regain independence. Home   Prior Functioning  Home Living Lives  With: Spouse Type of Home: House Home Access: Stairs to enter [employer] of Steps: 1 Home Layout: One level Home Adaptive Equipment: None Prior Function Level of Independence: Independent Able to Take Stairs?: Yes Communication Communication: No difficulties Dominant Hand: Right    Cognition  Overall Cognitive Status: Impaired Area of Impairment: Safety/judgement Arousal/Alertness: Awake/alert Orientation Level: Appears intact for tasks assessed Behavior During Session: [facility] for tasks performed Safety/Judgement: Impulsive;Decreased awareness of need for assistance;Decreased safety judgement for tasks assessed    Extremity/Trunk Assessment Right Lower Extremity Assessment RLE ROM/Strength/Tone: [facility] for tasks assessed RLE Sensation: Deficits RLE Sensation Deficits: Impaired perception of body in space. Pt attempts to compensate with lateral head/neck flexion to R side and by leaning to R side, at times overcompensating.  RLE Coordination: Deficits RLE Coordination Deficits: Ataxic gait. Left Lower Extremity Assessment LLE ROM/Strength/Tone: WFL for tasks assessed LLE Sensation: Deficits LLE Sensation Deficits: Impaired perception of body in space. Pt attempts to compensate with lateral head/neck flexion to R side and by leaning to R side, at times overcompensating.  LLE Coordination: Deficits LLE Coordination Deficits: Ataxic gait. Trunk Assessment Trunk Assessment: Normal   Balance Balance Balance Assessed: Yes Static Standing Balance Static Standing - Balance Support: No upper extremity supported Static Standing - Level of Assistance: 4: Min assist Static Standing - Comment/# of Minutes: Had pt stand EO/EC-able to maintain without LOB with close supervision assist. Performed external perturbations, mulitidirectional-close supervision assist. Pt attempted  to stand statically with narrow BOS-pt unable to achieve position without 1 hand assist. Unable to maintain-LOB to L side repeatedly, requiring Min assist to prevent fall.   End of Session PT - End of Session Equipment Utilized During Treatment: Gait belt Activity Tolerance: Patient tolerated treatment well Patient left: in chair;with call bell/phone within reach;with chair alarm set  GP Functional Assessment Tool Used: Clinical judgement Functional Limitation: Mobility: Walking and moving around Mobility: Walking and Moving Around Current Status [id number]): At least 20 percent but less than 40 percent impaired, limited or restricted Mobility: Walking and Moving Around Goal Status [phone]): At least 1 percent but less than 20 percent impaired, limited or restricted Mobility: Walking and Moving Around Discharge Status [phone]): At least 1 percent but less than 20 percent impaired, limited or restricted   [place] [facility] 07/03/2012, 11:37 AM [id number]

## 2012-07-03 NOTE — Consult Note (Signed)
Physical Medicine and Rehabilitation Consult Reason for Consult: Difficulty walking, headache. Referring Physician:  Dr. Manfred Arch.    HPI: Catherine Aguilar is a 56 y.o. female with history of untreated HTN who was admitted on 07/01/12 with history of left sided HA and unsteadiness. BP at admission 218/100. CT head negative. 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 2 D echo done revealing EF 60-65% and no wall abnormalities. Carotid dopplers without ICA stenosis.  Neurology consulted and recommended full work up as well as MRA neck to rule out dissection and ASA for CVA prophylaxis. MRA head/neck done revealing hypoplastic posterior circulation, small right PICA and left PICA not visualized, full study limited due to patient's uncooperativeness. MBS done revealing mild dysphagia and patient started on D3, thin liquids. Therapy evaluations done today and patient noted to be impulsive and  have gait/ balance deficits.  MD, therapy team recommending CIR.  Reportedly independent prior to admission. Husband lives at home he is on disability Review of Systems  HENT: Negative for hearing loss and neck pain.   Eyes: Positive for blurred vision and double vision.       Visual deficits much improved.   Respiratory: Negative for sputum production and shortness of breath.   Cardiovascular: Negative for chest pain and palpitations.  Gastrointestinal: Negative for heartburn, nausea, vomiting and abdominal pain.  Musculoskeletal: Negative for myalgias.  Neurological: Positive for dizziness (with quick movements and with turns to left lateral field.). Negative for focal weakness and headaches.  Psychiatric/Behavioral: Positive for memory loss. Negative for depression. The patient is not nervous/anxious.    Past Medical History  Diagnosis Date  . Hypertension     stopped taking medications 10 years ago   Past Surgical History  Procedure Date  . Abdominal hysterectomy 1985   Family History  Problem  Relation Age of Onset  . Cancer Father   . Heart attack Mother     Social History:  Married. 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 She reports that she has been smoking Cigarettes- a pack every 2 weeks since age 80.   She has a 4 pack-year smoking history. She has never used smokeless tobacco. She reports that she drinks alcohol--3 shots of gin and 2-- 40 ounce beer on weekends. . She reports that she does not use illicit drugs. Husband disabled due to back problems.  Allergies: No Known Allergies  No prescriptions prior to admission    Home: Home Living Lives With: Spouse Available Help at Discharge: Other (Comment) (spouse on disability) Type of Home: House Home Access: Stairs to enter [employer] of Steps: 1 Home Layout: One level Home Adaptive Equipment: None  Functional History: Prior Function Able to Take Stairs?: Yes Functional Status:  Mobility: Bed Mobility Bed Mobility: Supine to Sit Supine to Sit: 5: Supervision Transfers Transfers: Sit to Stand;Stand to Sit;Stand Pivot Transfers Sit to Stand: 4: Min assist;From bed;With upper extremity assist;From chair/3-in-1 Stand to Sit: 4: Min assist;To chair/3-in-1;With armrests;With upper extremity assist Ambulation/Gait Ambulation/Gait Assistance: 3: Mod assist;4: Min assist Ambulation Distance (Feet): 200 Feet Assistive device: Rolling walker;None Ambulation/Gait Assistance Details: Attempted ambulation without assistive device-Mod assist with repeated LOB to L side. Had pt ambulate with RW-Min A with intermittent scissoring, LOB to L side. Assist to stabilize throughout ambulation and for maneuvering with RW. VCs safety, pacing, distance/position from RW.  Gait Pattern: Step-through pattern;Ataxic;Scissoring;Decreased stride length;Wide base of support    ADL: ADL Eating/Feeding: Simulated;Independent Where  Assessed - Eating/Feeding:  Chair Grooming: Simulated;Wash/dry hands;Set up Where Assessed - Grooming: Supported sitting Upper Body Bathing: Simulated;Chest;Right arm;Left arm;Abdomen;Supervision/safety;Other (comment);Set up (supervision for safety as pt impulsive) Where Assessed - Upper Body Bathing: Unsupported sitting Lower Body Bathing: Simulated;Minimal assistance;Other (comment) (for balance, pt with LOB to L) Where Assessed - Lower Body Bathing: Supported sit to stand Upper Body Dressing: Simulated;Supervision/safety;Set up Where Assessed - Upper Body Dressing: Unsupported sitting Lower Body Dressing: Simulated;Minimal assistance;Other (comment) (as above for balance. ) Where Assessed - Lower Body Dressing: Supported sit to stand Toilet Transfer: Performed;Minimal assistance Toilet Transfer Method: Stand Ecologist: Bedside commode;Other (comment) (pt about to get up to [facility] on her own. ) Tub/Shower Transfer Method: Not assessed Equipment Used: Rolling walker ADL Comments: Seen with PT. Initially didnt use an assistive device and pt required min to mod assist for balance with LOB to the L. Used RW for functional mobility and pt with improved balance requiring min assist. Pt is impulsive and moves quickly. She states she is used to doing things "fast" and was attempting to get up to [facility] by herself as therapists entered the room . Emphasized the need for her to call for assist due to her balance deficits. Pt requires min assist for dynamic balance tasks such as pulling up and down mesh underwear.   Cognition: Cognition Arousal/Alertness: Awake/alert Orientation Level: Oriented X4 Cognition Overall Cognitive Status: Impaired Area of Impairment: Safety/judgement Arousal/Alertness: Awake/alert Orientation Level: Appears intact for tasks assessed Behavior During Session: [facility] for tasks performed Safety/Judgement: Decreased awareness of safety precautions;Decreased safety judgement for tasks  assessed;Impulsive;Decreased awareness of need for assistance Cognition - Other Comments: Pt trying to get up on her own when therapists entered room and observed multiple cords under feet and SCDs still connected. Emphasized need to call for assist. Pt is impulsive and moves quickly.   Blood pressure 162/88, pulse 54, temperature 97.3 F (36.3 C), temperature source Oral, resp. rate 18, height 5\' 9"  (1.753 m), weight 75 kg (165 lb 5.5 oz), SpO2 100.00%. Physical Exam  Nursing note and vitals reviewed. Constitutional: She is oriented to person, place, and time. She appears well-developed and well-nourished.  HENT:  Head: Normocephalic and atraumatic.  Eyes: Conjunctivae normal are normal. Pupils are equal, round, and reactive to light.       Horizontal nystagmus to left lateral field.   Neck: Normal range of motion.  Cardiovascular: Normal rate and regular rhythm.   Pulmonary/Chest: Effort normal and breath sounds normal.  Abdominal: Soft. Bowel sounds are normal.  Musculoskeletal: She exhibits no edema and no tenderness.  Neurological: She is alert and oriented to person, place, and time. A cranial nerve deficit is present.       Answers appropriately but talks fast.  Attends to tasks and able to follow multi step commands without difficulty. Ataxia with left finger to nose and decrease in fine motor control of LLE.   Skin: Skin is warm and dry.  Cerebellar positive ataxia finger nose to finger in the left upper extremity. Heel to shin is intact Positive Romberg, unable to stand with feet together even with eyes open Motor strength 5/5 in the upper and lower extremities Sensation is reduced left  V2 V3  Results for orders placed during the hospital encounter of 07/01/12 (from the past 24 hour(s))  BASIC METABOLIC PANEL     Status: Abnormal   Collection Time   07/03/12  5:05 AM      Component Value Range  Sodium 139  135 - 145 mEq/L   Potassium 3.8  3.5 - 5.1 mEq/L   Chloride 104  96  - 112 mEq/L   CO2 24  19 - 32 mEq/L   Glucose, Bld 91  70 - 99 mg/dL   BUN 10  6 - 23 mg/dL   Creatinine, Ser 0.90  0.50 - 1.10 mg/dL   Calcium 9.7  8.4 - 10.5 mg/dL   GFR calc non Af Amer 70 (*) >90 mL/min   GFR calc Af Amer 81 (*) >90 mL/min  LIPID PANEL     Status: Abnormal   Collection Time   07/03/12  5:05 AM      Component Value Range   Cholesterol 161  0 - 200 mg/dL   Triglycerides 121  <150 mg/dL   HDL 39 (*) >39 mg/dL   Total CHOL/HDL Ratio 4.1     VLDL 24  0 - 40 mg/dL   LDL Cholesterol 98  0 - 99 mg/dL  GLUCOSE, CAPILLARY     Status: Abnormal   Collection Time   07/03/12  7:20 AM      Component Value Range   Glucose-Capillary 101 (*) 70 - 99 mg/dL     Assessment/Plan: 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 Has facial numbness and tingling as well 1. Does the need for close, 24 hr/day medical supervision in concert with the patient's rehab needs make it unreasonable for this patient to be served in a less intensive setting? Yes 2. Co-Morbidities requiring supervision/potential complications: Hypertension, poor safety awareness 3. Due to bowel management, safety, skin/wound care, disease management, medication administration and patient education, does the patient require 24 hr/day rehab nursing? Yes 4. Does the patient require coordinated care of a physician, rehab nurse, PT (1-2 hrs/day, 5 days/week) and OT (1-2 hrs/day, 5 days/week) to address physical and functional deficits in the context of the above medical diagnosis(es)? Potentially Addressing deficits in the following areas: balance, endurance, locomotion, strength, transferring, bowel/bladder control, bathing, dressing, feeding, grooming and toileting 5. Can the patient actively participate in an intensive therapy program of at least 3 hrs of therapy per day at least 5 days per week? Yes 6. The potential for patient to make measurable gains  while on inpatient rehab is excellent 7. Anticipated functional outcomes upon discharge from inpatient rehab are Modified independent mobility with PT, Modified independent ADLs with OT, Not applicable with SLP. 8. Estimated rehab length of stay to reach the above functional goals is: 7-10 days 9. Does the patient have adequate social supports to accommodate these discharge functional goals? Yes 10. Anticipated D/C setting: Home 11. Anticipated post D/C treatments: Outpt therapy 12. Overall Rehab/Functional Prognosis: excellent  RECOMMENDATIONS: This patient's condition is appropriate for continued rehabilitative care in the following setting: CIR Patient has agreed to participate in recommended program. Yes Note that insurance prior authorization may be required for reimbursement for recommended care.  Comment:    07/03/2012

## 2012-07-03 NOTE — Progress Notes (Signed)
PCP: Does not have a PCP  Brief HPI: 56 year old female with history of untreated high blood pressure who presented with left sided headache started prior to this admission associated with 1 episode of vomiting. Blood pressure in ED was 218/100 and it responded to catapres and metoprolol. Patient also had left sided numbness and weakness.  Past medical history:  Past Medical History  Diagnosis Date  . Hypertension     Consultants: Neurology  Procedures: None  Subjective: Patient still with numbness to the left face. No complaints otherwise. Was able to walk with PT.   Objective: Vital signs in last 24 hours: Temp:  [97.3 F (36.3 C)-98.3 F (36.8 C)] 97.3 F (36.3 C) (10/24 0511) Pulse Rate:  [54-67] 54  (10/24 0511) Resp:  [18] 18  (10/24 0511) BP: (144-162)/(69-88) 162/88 mmHg (10/24 0511) SpO2:  [100 %] 100 % (10/24 0511) Weight:  [75 kg (165 lb 5.5 oz)] 75 kg (165 lb 5.5 oz) (10/24 0511) Weight change: -0.932 kg (-2 lb 0.9 oz) Last BM Date: 07/01/12  Intake/Output from previous day: 10/23 0701 - 10/24 0700 In: 1940 [P.O.:800; I.V.:1140] Out: 2600 [Urine:2400; Emesis/NG output:200] Intake/Output this shift:    General appearance: alert, cooperative, appears stated age and no distress Head: Normocephalic, without obvious abnormality, atraumatic Resp: clear to auscultation bilaterally Cardio: regular rate and rhythm, S1, S2 normal, no murmur, click, rub or gallop GI: soft, non-tender; bowel sounds normal; no masses,  no organomegaly Extremities: extremities normal, atraumatic, no cyanosis or edema Pulses: 2+ and symmetric Neurologic: Alert and oriented x 3. Decreased sensation left face. Strength equal bilaterally.  Lab Results:  Basename 07/02/12 0525 07/01/12 0950  WBC 7.4 6.5  HGB 13.0 14.3  HCT 39.6 43.3  PLT 396 340   BMET  Basename 07/03/12 0505 07/02/12 0525  NA 139 135  K 3.8 3.0*  CL 104 99  CO2 24 25  GLUCOSE 91 105*  BUN 10 11    CREATININE 0.90 0.84  CALCIUM 9.7 9.7  ALT -- 16    Studies/Results: Mr [facility] Contrast  07/03/2012  *RADIOLOGY REPORT*  Clinical Data:  Left medullary infarct.  Hypertension  MRA HEAD WITHOUT CONTRAST  Technique:  Angiographic images of the Circle of Willis were obtained using MRA technique without intravenous contrast.  Comparison:  MRI 07/02/2012  Findings:  Both vertebral arteries are patent to the basilar. Posterior circulation is relatively small due to fetal origin of the posterior cerebral artery bilaterally.  No significant vertebral stenosis.  Small right PICA is visualized.  Left PICA is not visualized and may be hypoplastic or occluded.  Small AICA is present bilaterally.  Basilar is congenitally small and ends in the superior cerebellar artery bilaterally.  Posterior cerebral arteries are both supplied from the internal carotid artery with hypoplastic distal basilar and P1 segments.  Internal carotid artery is patent bilaterally without stenosis. Anterior and middle cerebral arteries are patent bilaterally.  Negative for cerebral aneurysm.  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  Small right PICA is visualized.  Left PICA is not visualized and may be occluded or this territory may be supplied from the left AICA.  The patient has a left medullary infarct which is supplied by pica.  *RADIOLOGY REPORT*  Clinical Data:  Left medullary infarct  MRA NECK WITHOUT CONTRAST  Technique:  Angiographic images of the neck were obtained using MRA technique without intravenous  contrast.  Carotid stenosis measurements (when applicable) are obtained utilizing NASCET criteria, using the distal internal carotid diameter as the denominator.  Comparison:  MRI head 07/02/2012  Findings:  The patient did receive 15 ml Multihance IV however became uncooperative and refused to continue.  No postcontrast imaging  was performed.  Precontrast imaging was performed.  Both vertebral arteries are patent with antegrade flow.  There is scattered areas of   signal loss in the vertebral arteries which is most likely due to artifact from tortuosity.  I cannot exclude vertebral stenosis or dissection on the study.  Both internal carotid arteries are patent.  There is slight irregularity of the carotid bifurcation, favor to be artifact rather than atherosclerotic disease.  There is signal loss in the internal carotid artery the skull base, felt to be signal loss.  No high-grade carotid stenosis.  IMPRESSION: Unenhanced only imaging was obtained as the patient was not able to cooperate with postcontrast imaging.  Both vertebral arteries are patent to the basilar.  Both carotid arteries are patent to the skull base.  There are numerous areas of signal loss in both vertebral arteries and carotid arteries, felt to be due to artifact.  Lack of intravenous contrast does limit resolution of the study.   Original Report Authenticated By: Truett Perna, M.D.    Mr Angiogram Neck Wo Contrast  07/03/2012  *RADIOLOGY REPORT*  Clinical Data:  Left medullary infarct.  Hypertension  MRA HEAD WITHOUT CONTRAST  Technique:  Angiographic images of the Circle of Willis were obtained using MRA technique without intravenous contrast.  Comparison:  MRI 07/02/2012  Findings:  Both vertebral arteries are patent to the basilar. Posterior circulation is relatively small due to fetal origin of the posterior cerebral artery bilaterally.  No significant vertebral stenosis.  Small right PICA is visualized.  Left PICA is not visualized and may be hypoplastic or occluded.  Small AICA is present bilaterally.  Basilar is congenitally small and ends in the superior cerebellar artery bilaterally.  Posterior cerebral arteries are both supplied from the internal carotid artery with hypoplastic distal basilar and P1 segments.  Internal carotid artery is patent  bilaterally without stenosis. Anterior and middle cerebral arteries are patent bilaterally.  Negative for cerebral aneurysm.  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  Small right PICA is visualized.  Left PICA is not visualized and may be occluded or this territory may be supplied from the left AICA.  The patient has a left medullary infarct which is supplied by pica.  *RADIOLOGY REPORT*  Clinical Data:  Left medullary infarct  MRA NECK WITHOUT CONTRAST  Technique:  Angiographic images of the neck were obtained using MRA technique without intravenous contrast.  Carotid stenosis measurements (when applicable) are obtained utilizing NASCET criteria, using the distal internal carotid diameter as the denominator.  Comparison:  MRI head 07/02/2012  Findings:  The patient did receive 15 ml Multihance IV however became uncooperative and refused to continue.  No postcontrast imaging was performed.  Precontrast imaging was performed.  Both vertebral arteries are patent with antegrade flow.  There is scattered areas of   signal loss in the vertebral arteries which is most likely due to artifact from tortuosity.  I cannot exclude vertebral stenosis or dissection on the study.  Both internal carotid arteries are patent.  There is slight irregularity of the carotid bifurcation, favor to be artifact rather than atherosclerotic disease.  There is signal loss in the internal carotid artery the skull base, felt to be signal loss.  No high-grade carotid stenosis.  IMPRESSION: Unenhanced only imaging was obtained as the patient was not able to cooperate with postcontrast imaging.  Both vertebral arteries are patent to the basilar.  Both carotid arteries are patent to the skull base.  There are numerous areas of signal loss in both vertebral arteries and carotid arteries, felt to be due to artifact.  Lack of intravenous contrast does  limit resolution of the study.   Original Report Authenticated By: Truett Perna, M.D.    Mr Brain Wo Contrast  07/02/2012  *RADIOLOGY REPORT*  Clinical Data: Hypertensive crisis.  Longstanding uncontrolled hypertension.  MRI HEAD WITHOUT CONTRAST  Technique:  Multiplanar, multiecho pulse sequences of the brain and surrounding structures were obtained according to standard protocol without intravenous contrast.  Comparison:  CT of the head without contrast.  Findings: An acute non hemorrhagic infarct is present within the posterior left lateral medulla.  T2 hyperintensity is noted in the area as well, compatible with the time frame of at least several hours.  No other acute infarct is present.  There is no hemorrhage or mass lesion.  Mild scattered periventricular subcortical T2 hyperintensities are slightly greater than expected for age.  The ventricles are normal size.  No significant extra-axial fluid collection is present.  The flow is present in the major intracranial arteries.  The globes and orbits are intact.  Fluid levels are present in the maxillary and sphenoid sinuses bilaterally.  There are scattered fluid levels in the ethmoid air cells.  Circumferential mucosal thickening is noted in the right frontal sinus.  The mastoid air cells are clear.  IMPRESSION: 1.  Acute / subacute non hemorrhagic left lateral medullary infarct. 2.  Scattered periventricular and subcortical T2 hyperintensities are slightly greater than expected for age. The finding is nonspecific but can be seen in the setting of chronic microvascular ischemia, a demyelinating process such as multiple sclerosis, vasculitis, complicated migraine headaches, or as the sequelae of a prior infectious or inflammatory process. 3.  Multiple fluid levels throughout the paranasal sinuses, consistent with acute sinusitis.   Original Report Authenticated By: Resa Miner. MATTERN, M.D.    Dg Swallowing Func-speech Pathology  07/03/2012  Macario Golds, [place]     07/03/2012 10:43 AM Objective Swallowing Evaluation: Modified Barium Swallowing Study   Patient Details  Name: Tyshawna Stottlemyre MRN: [id number] Date of Birth: 1956/01/20  Today's Date: 07/03/2012 Time: [id number] SLP Time Calculation (min): 41 min  Past Medical History:  Past Medical History  Diagnosis Date  . Hypertension    Past Surgical History:  Past Surgical History  Procedure Date  . Abdominal hysterectomy 1985   HPI:  HPI: 56 year old female with history of untreated high blood  pressure who presented with left sided headache started prior to  this admission associated with 1 episode of vomiting. Blood  pressure in ED was 218/100 and it responded to catapres and  metoprolol. Patient also had left sided numbness and weakness.   MRI brain, small left lateral medullary infarct.  Pt reports  dysphagia PTA over the last few months resulting in coughing with  liquids and difficulties getting solid foods down.   In xray  suite, pt reported sensation of food sticking at times pointing  to chest area- this was not clear in clinical interview in room.     Assessment / Plan / Recommendation Clinical Impression  Dysphagia Diagnosis: Mild pharyngeal phase dysphagia (suspect  mild esophageal dysphagia) Clinical impression: Pt presents with minimal oral and mild  pharyngeal dysphagia with sensorimotor deficits.   Laryngeal  elevation was compromised allowing laryngeal penetration of  liquid and cracker particulate.  Cued throat clear removed trace  penetration.  Chin tuck posture decr amount of liquid penetrated,  and may be beneficial for pt to use if clinically decreases cough  with liquids.  Pt with inadequate mastication of solids - stating  she doesn't chew well as she eats rapidly.  Advised her to  masticate foods thoroughly due to incre pulm risk if aspirates  solids.  Suspect component of mild esophageal issues --  radiologist not present to confirm.  Advised pt to follow solids  with liquids to  aid esophageal clearance.  Given dentition  issues, rec pt consume mechanical soft/ground meat with thin  liquids with strict precautions.  Given pt reports dysphagia x 2  months without acute worsening, suspect remote lateral medullary  cva as source and subacute dysphagia.      Treatment Recommendation       Diet Recommendation Dysphagia 3 (Mechanical Soft);Thin liquid   Liquid Administration via: Cup;Straw Medication Administration:  (as tolerated, large crush if not  contraindicated) Compensations: Slow rate;Small sips/bites;Check for  pocketing;Follow solids with liquid;Clear throat intermittently Postural Changes and/or Swallow Maneuvers: Seated upright 90  degrees;Upright 30-60 min after meal    Other  Recommendations Oral Care Recommendations: Oral care BID   Follow Up Recommendations   (TBD)    Frequency and Duration min 2x/week  2 weeks   Pertinent Vitals/Pain Afebrile, decreased    SLP Swallow Goals Patient will utilize recommended strategies during swallow to  increase swallowing safety with: Independent assistance   General Date of Onset: 07/03/12 HPI: HPI: 56 year old female with history of untreated high blood  pressure who presented with left sided headache started prior to  this admission associated with 1 episode of vomiting. Blood  pressure in ED was 218/100 and it responded to catapres and  metoprolol. Patient also had left sided numbness and weakness.   MRI brain, small left lateral medullary infarct.  Pt reports  dysphagia PTA over the last few months resulting in coughing with  liquids and difficulties getting solid foods down.   Type of Study: Modified Barium Swallowing Study Reason for Referral: Objectively evaluate swallowing function Previous Swallow Assessment: none Diet Prior to this Study: NPO Temperature Spikes Noted: No Respiratory Status: Room air History of Recent Intubation: No Behavior/Cognition: Alert;Cooperative;Pleasant mood Oral Cavity - Dentition: Adequate natural dentition  Oral Motor / Sensory Function: Within functional limits Self-Feeding Abilities: Able to feed self Patient Positioning: Upright in bed Baseline Vocal Quality: Clear Volitional Cough: Strong Volitional Swallow: Able to elicit Anatomy: Within functional limits    Reason for Referral Objectively evaluate swallowing function   Oral Phase Oral Preparation/Oral Phase Oral Phase: Impaired Oral - Thin Oral - Thin Teaspoon: Within functional limits Oral - Thin Cup: Piecemeal swallowing;Delayed oral transit Oral - Thin Straw: Delayed oral transit;Piecemeal swallowing Oral - Solids Oral - Puree: Within functional limits Oral - Regular: Impaired mastication Oral - Pill: Delayed oral transit (pt swallowed pill without  liquid)   Pharyngeal Phase Pharyngeal Phase Pharyngeal Phase: Impaired Pharyngeal - Thin Pharyngeal - Thin Teaspoon: Reduced anterior laryngeal  mobility;Reduced laryngeal elevation;Reduced airway/laryngeal  closure;Delayed swallow initiation;Premature spillage to pyriform  sinuses Pharyngeal - Thin Cup: Reduced airway/laryngeal closure;Reduced  laryngeal elevation;Reduced anterior laryngeal  mobility;Pharyngeal residue -  valleculae;Pharyngeal residue -  pyriform sinuses;Compensatory strategies attempted  (Comment);Penetration/Aspiration during swallow (trace stasis) Penetration/Aspiration details (thin cup): Material enters  airway, remains ABOVE vocal cords and not ejected out Pharyngeal - Thin Straw: Reduced airway/laryngeal closure;Reduced  laryngeal elevation;Pharyngeal residue - pyriform  sinuses;Pharyngeal residue - valleculae;Compensatory strategies  attempted (Comment);Penetration/Aspiration during swallow (trace  stasis, dry swallow) Penetration/Aspiration details (thin straw): Material enters  airway, remains ABOVE vocal cords and not ejected out Pharyngeal - Solids Pharyngeal - Puree: Within functional limits Pharyngeal - Regular: Penetration/Aspiration during swallow  (trace penetration of solid  (particulate) vocal fold SILENT) Penetration/Aspiration details (regular): Material enters airway,  CONTACTS cords and not ejected out (cued throat clear removed) Pharyngeal - Pill: Within functional limits  Cervical Esophageal Phase    GO    Cervical Esophageal Phase Cervical Esophageal Phase: Impaired Cervical Esophageal Phase - Solids Pill: Other (Comment) (barium tablet appeared to lodge at  proximal esophagus) Cervical Esophageal Phase - Comment Cervical Esophageal Comment: pt swallowed barium tablet without  liquid, sensed stasis at proximal esophagus, appearance of  delayed clearance of esophagus- thin liquids aided clearance of  pudding- at one point, pt sensed stasis when esophagus appeared  clear:  radiologist not present to confirm    Functional Assessment Tool Used: mbs-clinical judgement Functional Limitations: Swallowing Swallow Current Status [id number]): At least 1 percent but less than  20 percent impaired, limited or restricted Swallow Goal Status [phone]): At least 1 percent but less than 20  percent impaired, limited or restricted Swallow Discharge Status [phone]): At least 1 percent but less  than 20 percent impaired, limited or restricted    Luanna Salk, MS [facility] SLP [phone]      Medications:  Scheduled:    . aspirin  325 mg Oral Daily  . enoxaparin (LOVENOX) injection  40 mg Subcutaneous Q24H  . hydrALAZINE  25 mg Oral Q6H  . potassium chloride  10 mEq Intravenous Q1 Hr x 4  . potassium chloride  40 mEq Oral Q4H  . sodium chloride  3 mL Intravenous Q12H  . DISCONTD: amLODipine  5 mg Oral Daily  . DISCONTD: aspirin  300 mg Rectal Daily  . DISCONTD: aspirin  325 mg Oral Daily  . DISCONTD: hydrochlorothiazide  25 mg Oral Daily   Continuous:    . DISCONTD: sodium chloride 80 mL/hr at 07/03/12 0403   [id number], acetaminophen, gadobenate dimeglumine, hydrALAZINE  Assessment/Plan:  Active Problems:  Accelerated hypertension  Hypokalemia  Left facial  numbness   Acute CVA (Left Medullary) Seen by neurology. Continue Aspirin. Permissive hypertension. Start statin. PT/OT ongoing. ECHO done. S/P swallow evaluation. Dysphagia 3 diet.  Accelerated hypertension  Was likely due to longstanding untreated hypertension. BP is now better. Continue hydralazine.   Hypokalemia Repleted  Code Status Full   Family Communication Discussed with patient.  Disposition CIR consulted. Discharge likely on 10/25.  DVT Prophylaxis Enoxaparin   LOS: 2 days   [facility] Pager [phone] 07/03/2012, 11:52 AM

## 2012-07-03 NOTE — Progress Notes (Signed)
Rehab Admissions Coordinator Note:  Patient was screened by Cleatrice Burke for appropriateness for an Inpatient Acute Rehab Consult.  PT eval noted. OT pending. At this time, we are recommending Inpatient Rehab consult. I have contacted Dr. Maryland Pink for an order.  Cleatrice Burke, RN 07/03/2012, 11:55 AM  I can be reached at [phone].

## 2012-07-03 NOTE — Progress Notes (Signed)
Order received for swallow evaluation, note pt failed swallow screen due to h/o dysphagia.  Upon discussion with pt, she reports dysphagia x a few months with occasionally having drink "go down the wrong way" and foods requiring great effort to clear.   Pt with basin at bedside full of secretions.  Pt denies weight loss but does acknowledge a recent "cold".    Due to medullary cva and overt dysphagia, rec proceed with MBS in lieu of bedside for visualization of musculature and to assess airway protection.    Luanna Salk, [place] [facility] SLP [phone]

## 2012-07-03 NOTE — Progress Notes (Signed)
Subjective: Patient no new focal complaints.  Continues to have numbness on the left side of the face.    Objective: Current vital signs: BP 174/81  Pulse 63  Temp 98.8 F (37.1 C) (Oral)  Resp 18  Ht 5\' 9"  (1.753 m)  Wt 75 kg (165 lb 5.5 oz)  BMI 24.42 kg/m2  SpO2 100% Vital signs in last 24 hours: Temp:  [97.3 F (36.3 C)-98.8 F (37.1 C)] 98.8 F (37.1 C) (10/24 1426) Pulse Rate:  [54-67] 63  (10/24 1426) Resp:  [18] 18  (10/24 1426) BP: (144-174)/(70-88) 174/81 mmHg (10/24 1426) SpO2:  [100 %] 100 % (10/24 1426) Weight:  [75 kg (165 lb 5.5 oz)] 75 kg (165 lb 5.5 oz) (10/24 0511)  Intake/Output from previous day: 10/23 0701 - 10/24 0700 In: 1940 [P.O.:800; I.V.:1140] Out: 2600 [Urine:2400; Emesis/NG output:200] Intake/Output this shift: Total I/O In: 240 [P.O.:240] Out: 800 [Urine:800] Nutritional status: Cardiac  Neurologic Exam: Mental Status: Alert, oriented, thought content appropriate.  Speech fluent without evidence of aphasia.  Able to follow 3 step commands without difficulty. Cranial Nerves: II: Discs flat bilaterally; Visual fields grossly normal, pupils equal, round, reactive to light and accommodation III,IV, VI: ptosis not present, extra-ocular motions intact bilaterally V,VII: smile symmetric, facial light touch sensation decreased on the left VIII: hearing normal bilaterally IX,X: gag reflex present XI: bilateral shoulder shrug XII: midline tongue extension Motor: Right : Upper extremity   5/5    Left:     Upper extremity   5/5  Lower extremity   5/5     Lower extremity   5/5 Tone and bulk:normal tone throughout; no atrophy noted Sensory: Pinprick and light touch decreased on the right Deep Tendon Reflexes: 3+ and symmetric throughout Plantars: Right: downgoing   Left: downgoing  Lab Results: Basic Metabolic Panel:  Lab 0000000 0505 07/02/12 0525 07/01/12 0950  NA 139 135 138  K 3.8 3.0* 3.9  CL 104 99 102  CO2 24 25 20   GLUCOSE 91  105* 170*  BUN 10 11 17   CREATININE 0.90 0.84 0.75  CALCIUM 9.7 9.7 9.8  MG -- -- --  PHOS -- -- --    Liver Function Tests:  Lab 07/02/12 0525  AST 13  ALT 16  ALKPHOS 67  BILITOT 0.3  PROT 6.8  ALBUMIN 3.5   No results found for this basename: LIPASE:5,AMYLASE:5 in the last 168 hours No results found for this basename: AMMONIA:3 in the last 168 hours  CBC:  Lab 07/02/12 0525 07/01/12 0950  WBC 7.4 6.5  NEUTROABS -- 4.5  HGB 13.0 14.3  HCT 39.6 43.3  MCV 82.3 83.0  PLT 396 340    Cardiac Enzymes: No results found for this basename: CKTOTAL:5,CKMB:5,CKMBINDEX:5,TROPONINI:5 in the last 168 hours  Lipid Panel:  Lab 07/03/12 0505  CHOL 161  TRIG 121  HDL 39*  CHOLHDL 4.1  VLDL 24  LDLCALC 98    CBG:  Lab 07/03/12 0720 07/02/12 1148 07/02/12 0750  GLUCAP 101* 150* 115*    Microbiology: No results found for this or any previous visit.  Coagulation Studies: No results found for this basename: LABPROT:5,INR:5 in the last 72 hours  Imaging: Mr Virgel Paling [id number] Contrast  07/03/2012  *RADIOLOGY REPORT*  Clinical Data:  Left medullary infarct.  Hypertension  MRA HEAD WITHOUT CONTRAST  Technique:  Angiographic images of the Circle of Willis were obtained using MRA technique without intravenous contrast.  Comparison:  MRI 07/02/2012  Findings:  Both vertebral arteries  are patent to the basilar. Posterior circulation is relatively small due to fetal origin of the posterior cerebral artery bilaterally.  No significant vertebral stenosis.  Small right PICA is visualized.  Left PICA is not visualized and may be hypoplastic or occluded.  Small AICA is present bilaterally.  Basilar is congenitally small and ends in the superior cerebellar artery bilaterally.  Posterior cerebral arteries are both supplied from the internal carotid artery with hypoplastic distal basilar and P1 segments.  Internal carotid artery is patent bilaterally without stenosis. Anterior and middle cerebral  arteries are patent bilaterally.  Negative for cerebral aneurysm.  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  Small right PICA is visualized.  Left PICA is not visualized and may be occluded or this territory may be supplied from the left AICA.  The patient has a left medullary infarct which is supplied by pica.  *RADIOLOGY REPORT*  Clinical Data:  Left medullary infarct  MRA NECK WITHOUT CONTRAST  Technique:  Angiographic images of the neck were obtained using MRA technique without intravenous contrast.  Carotid stenosis measurements (when applicable) are obtained utilizing NASCET criteria, using the distal internal carotid diameter as the denominator.  Comparison:  MRI head 07/02/2012  Findings:  The patient did receive 15 ml Multihance IV however became uncooperative and refused to continue.  No postcontrast imaging was performed.  Precontrast imaging was performed.  Both vertebral arteries are patent with antegrade flow.  There is scattered areas of   signal loss in the vertebral arteries which is most likely due to artifact from tortuosity.  I cannot exclude vertebral stenosis or dissection on the study.  Both internal carotid arteries are patent.  There is slight irregularity of the carotid bifurcation, favor to be artifact rather than atherosclerotic disease.  There is signal loss in the internal carotid artery the skull base, felt to be signal loss.  No high-grade carotid stenosis.  IMPRESSION: Unenhanced only imaging was obtained as the patient was not able to cooperate with postcontrast imaging.  Both vertebral arteries are patent to the basilar.  Both carotid arteries are patent to the skull base.  There are numerous areas of signal loss in both vertebral arteries and carotid arteries, felt to be due to artifact.  Lack of intravenous contrast does limit resolution of the study.   Original Report  Authenticated By: Truett Perna, M.D.    Mr Angiogram Neck Wo Contrast  07/03/2012  *RADIOLOGY REPORT*  Clinical Data:  Left medullary infarct.  Hypertension  MRA HEAD WITHOUT CONTRAST  Technique:  Angiographic images of the Circle of Willis were obtained using MRA technique without intravenous contrast.  Comparison:  MRI 07/02/2012  Findings:  Both vertebral arteries are patent to the basilar. Posterior circulation is relatively small due to fetal origin of the posterior cerebral artery bilaterally.  No significant vertebral stenosis.  Small right PICA is visualized.  Left PICA is not visualized and may be hypoplastic or occluded.  Small AICA is present bilaterally.  Basilar is congenitally small and ends in the superior cerebellar artery bilaterally.  Posterior cerebral arteries are both supplied from the internal carotid artery with hypoplastic distal basilar and P1 segments.  Internal carotid artery is patent bilaterally without stenosis. Anterior and middle cerebral arteries are patent bilaterally.  Negative for cerebral aneurysm.  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  Small right PICA is visualized.  Left PICA is not visualized and may be occluded or this territory may be supplied from the left AICA.  The patient has a left medullary infarct which is supplied by pica.  *RADIOLOGY REPORT*  Clinical Data:  Left medullary infarct  MRA NECK WITHOUT CONTRAST  Technique:  Angiographic images of the neck were obtained using MRA technique without intravenous contrast.  Carotid stenosis measurements (when applicable) are obtained utilizing NASCET criteria, using the distal internal carotid diameter as the denominator.  Comparison:  MRI head 07/02/2012  Findings:  The patient did receive 15 ml Multihance IV however became uncooperative and refused to continue.  No postcontrast imaging was performed.   Precontrast imaging was performed.  Both vertebral arteries are patent with antegrade flow.  There is scattered areas of   signal loss in the vertebral arteries which is most likely due to artifact from tortuosity.  I cannot exclude vertebral stenosis or dissection on the study.  Both internal carotid arteries are patent.  There is slight irregularity of the carotid bifurcation, favor to be artifact rather than atherosclerotic disease.  There is signal loss in the internal carotid artery the skull base, felt to be signal loss.  No high-grade carotid stenosis.  IMPRESSION: Unenhanced only imaging was obtained as the patient was not able to cooperate with postcontrast imaging.  Both vertebral arteries are patent to the basilar.  Both carotid arteries are patent to the skull base.  There are numerous areas of signal loss in both vertebral arteries and carotid arteries, felt to be due to artifact.  Lack of intravenous contrast does limit resolution of the study.   Original Report Authenticated By: Truett Perna, M.D.    Mr Brain Wo Contrast  07/02/2012  *RADIOLOGY REPORT*  Clinical Data: Hypertensive crisis.  Longstanding uncontrolled hypertension.  MRI HEAD WITHOUT CONTRAST  Technique:  Multiplanar, multiecho pulse sequences of the brain and surrounding structures were obtained according to standard protocol without intravenous contrast.  Comparison:  CT of the head without contrast.  Findings: An acute non hemorrhagic infarct is present within the posterior left lateral medulla.  T2 hyperintensity is noted in the area as well, compatible with the time frame of at least several hours.  No other acute infarct is present.  There is no hemorrhage or mass lesion.  Mild scattered periventricular subcortical T2 hyperintensities are slightly greater than expected for age.  The ventricles are normal size.  No significant extra-axial fluid collection is present.  The flow is present in the major intracranial arteries.  The  globes and orbits are intact.  Fluid levels are present in the maxillary and sphenoid sinuses bilaterally.  There are scattered fluid levels in the ethmoid air cells.  Circumferential mucosal thickening is noted in the right frontal sinus.  The mastoid air cells are clear.  IMPRESSION: 1.  Acute / subacute non hemorrhagic left lateral medullary infarct. 2.  Scattered periventricular and subcortical T2 hyperintensities are slightly greater than expected for age. The finding is nonspecific but can be seen in the setting of chronic microvascular ischemia, a demyelinating process such as multiple sclerosis, vasculitis, complicated migraine headaches, or as the sequelae of a prior infectious or inflammatory process. 3.  Multiple fluid levels throughout the paranasal sinuses, consistent with acute sinusitis.   Original Report Authenticated By: Resa Miner. MATTERN, M.D.    Dg Swallowing Func-speech Pathology  07/03/2012  Macario Golds, [place]     07/03/2012 10:43 AM Objective Swallowing Evaluation:  Modified Barium Swallowing Study   Patient Details  Name: Chesa Muldrow MRN: [id number] Date of Birth: 1956-04-17  Today's Date: 07/03/2012 Time: [id number] SLP Time Calculation (min): 41 min  Past Medical History:  Past Medical History  Diagnosis Date  . Hypertension    Past Surgical History:  Past Surgical History  Procedure Date  . Abdominal hysterectomy 1985   HPI:  HPI: 56 year old female with history of untreated high blood  pressure who presented with left sided headache started prior to  this admission associated with 1 episode of vomiting. Blood  pressure in ED was 218/100 and it responded to catapres and  metoprolol. Patient also had left sided numbness and weakness.   MRI brain, small left lateral medullary infarct.  Pt reports  dysphagia PTA over the last few months resulting in coughing with  liquids and difficulties getting solid foods down.   In xray  suite, pt reported sensation of food sticking at  times pointing  to chest area- this was not clear in clinical interview in room.     Assessment / Plan / Recommendation Clinical Impression  Dysphagia Diagnosis: Mild pharyngeal phase dysphagia (suspect  mild esophageal dysphagia) Clinical impression: Pt presents with minimal oral and mild  pharyngeal dysphagia with sensorimotor deficits.   Laryngeal  elevation was compromised allowing laryngeal penetration of  liquid and cracker particulate.  Cued throat clear removed trace  penetration.  Chin tuck posture decr amount of liquid penetrated,  and may be beneficial for pt to use if clinically decreases cough  with liquids.  Pt with inadequate mastication of solids - stating  she doesn't chew well as she eats rapidly.  Advised her to  masticate foods thoroughly due to incre pulm risk if aspirates  solids.  Suspect component of mild esophageal issues --  radiologist not present to confirm.  Advised pt to follow solids  with liquids to aid esophageal clearance.  Given dentition  issues, rec pt consume mechanical soft/ground meat with thin  liquids with strict precautions.  Given pt reports dysphagia x 2  months without acute worsening, suspect remote lateral medullary  cva as source and subacute dysphagia.      Treatment Recommendation       Diet Recommendation Dysphagia 3 (Mechanical Soft);Thin liquid   Liquid Administration via: Cup;Straw Medication Administration:  (as tolerated, large crush if not  contraindicated) Compensations: Slow rate;Small sips/bites;Check for  pocketing;Follow solids with liquid;Clear throat intermittently Postural Changes and/or Swallow Maneuvers: Seated upright 90  degrees;Upright 30-60 min after meal    Other  Recommendations Oral Care Recommendations: Oral care BID   Follow Up Recommendations   (TBD)    Frequency and Duration min 2x/week  2 weeks   Pertinent Vitals/Pain Afebrile, decreased    SLP Swallow Goals Patient will utilize recommended strategies during swallow to  increase  swallowing safety with: Independent assistance   General Date of Onset: 07/03/12 HPI: HPI: 56 year old female with history of untreated high blood  pressure who presented with left sided headache started prior to  this admission associated with 1 episode of vomiting. Blood  pressure in ED was 218/100 and it responded to catapres and  metoprolol. Patient also had left sided numbness and weakness.   MRI brain, small left lateral medullary infarct.  Pt reports  dysphagia PTA over the last few months resulting in coughing with  liquids and difficulties getting solid foods down.   Type of Study: Modified Barium Swallowing Study Reason for Referral: Objectively  evaluate swallowing function Previous Swallow Assessment: none Diet Prior to this Study: NPO Temperature Spikes Noted: No Respiratory Status: Room air History of Recent Intubation: No Behavior/Cognition: Alert;Cooperative;Pleasant mood Oral Cavity - Dentition: Adequate natural dentition Oral Motor / Sensory Function: Within functional limits Self-Feeding Abilities: Able to feed self Patient Positioning: Upright in bed Baseline Vocal Quality: Clear Volitional Cough: Strong Volitional Swallow: Able to elicit Anatomy: Within functional limits    Reason for Referral Objectively evaluate swallowing function   Oral Phase Oral Preparation/Oral Phase Oral Phase: Impaired Oral - Thin Oral - Thin Teaspoon: Within functional limits Oral - Thin Cup: Piecemeal swallowing;Delayed oral transit Oral - Thin Straw: Delayed oral transit;Piecemeal swallowing Oral - Solids Oral - Puree: Within functional limits Oral - Regular: Impaired mastication Oral - Pill: Delayed oral transit (pt swallowed pill without  liquid)   Pharyngeal Phase Pharyngeal Phase Pharyngeal Phase: Impaired Pharyngeal - Thin Pharyngeal - Thin Teaspoon: Reduced anterior laryngeal  mobility;Reduced laryngeal elevation;Reduced airway/laryngeal  closure;Delayed swallow initiation;Premature spillage to pyriform  sinuses  Pharyngeal - Thin Cup: Reduced airway/laryngeal closure;Reduced  laryngeal elevation;Reduced anterior laryngeal  mobility;Pharyngeal residue - valleculae;Pharyngeal residue -  pyriform sinuses;Compensatory strategies attempted  (Comment);Penetration/Aspiration during swallow (trace stasis) Penetration/Aspiration details (thin cup): Material enters  airway, remains ABOVE vocal cords and not ejected out Pharyngeal - Thin Straw: Reduced airway/laryngeal closure;Reduced  laryngeal elevation;Pharyngeal residue - pyriform  sinuses;Pharyngeal residue - valleculae;Compensatory strategies  attempted (Comment);Penetration/Aspiration during swallow (trace  stasis, dry swallow) Penetration/Aspiration details (thin straw): Material enters  airway, remains ABOVE vocal cords and not ejected out Pharyngeal - Solids Pharyngeal - Puree: Within functional limits Pharyngeal - Regular: Penetration/Aspiration during swallow  (trace penetration of solid (particulate) vocal fold SILENT) Penetration/Aspiration details (regular): Material enters airway,  CONTACTS cords and not ejected out (cued throat clear removed) Pharyngeal - Pill: Within functional limits  Cervical Esophageal Phase    GO    Cervical Esophageal Phase Cervical Esophageal Phase: Impaired Cervical Esophageal Phase - Solids Pill: Other (Comment) (barium tablet appeared to lodge at  proximal esophagus) Cervical Esophageal Phase - Comment Cervical Esophageal Comment: pt swallowed barium tablet without  liquid, sensed stasis at proximal esophagus, appearance of  delayed clearance of esophagus- thin liquids aided clearance of  pudding- at one point, pt sensed stasis when esophagus appeared  clear:  radiologist not present to confirm    Functional Assessment Tool Used: mbs-clinical judgement Functional Limitations: Swallowing Swallow Current Status [id number]): At least 1 percent but less than  20 percent impaired, limited or restricted Swallow Goal Status [phone]): At least 1 percent  but less than 20  percent impaired, limited or restricted Swallow Discharge Status [phone]): At least 1 percent but less  than 20 percent impaired, limited or restricted    Luanna Salk, [facility] SLP [phone]      Medications:  I have reviewed the patient's current medications. Scheduled:   . aspirin  325 mg Oral Daily  . enoxaparin (LOVENOX) injection  40 mg Subcutaneous Q24H  . fluticasone  1 spray Each Nare Daily  . hydrALAZINE  25 mg Oral Q6H  . potassium chloride  10 mEq Intravenous Q1 Hr x 4  . potassium chloride  40 mEq Oral Q4H  . simvastatin  20 mg Oral q1800  . sodium chloride  3 mL Intravenous Q12H  . DISCONTD: aspirin  300 mg Rectal Daily    Assessment/Plan:  Patient Active Hospital Problem List: CVA (cerebral vascular accident) (07/03/2012)   Assessment: Patient has remained stable.  Echo shows  no intracardiac masses or thrombi.  Posterior circulation does show atherosclerosis but no evidence of dissection. Patient was on no anticoagulation prior to admission and therefore medical management is recommended.     Plan:  1.  Continue ASA at discharge.    No further neurologic intervention is recommended at this time.  If further questions arise, please call or page at that time.  Thank you for allowing neurology to participate in the care of this patient.    LOS: 2 days   Alexis Goodell, MD [facility] [phone] 07/03/2012  6:40 PM

## 2012-07-03 NOTE — Procedures (Signed)
Objective Swallowing Evaluation: Modified Barium Swallowing Study  Patient Details  Name: Catherine Aguilar MRN: [id number] Date of Birth: 03-05-1956  Today's Date: 07/03/2012 Time: [id number] Time Calculation (min): 41 min  Past Medical History:  Past Medical History  Diagnosis Date  . Hypertension    Past Surgical History:  Past Surgical History  Procedure Date  . Abdominal hysterectomy 1985   HPI:  HPI: 56 year old female with history of untreated high blood pressure who presented with left sided headache started prior to this admission associated with 1 episode of vomiting. Blood pressure in ED was 218/100 and it responded to catapres and metoprolol. Patient also had left sided numbness and weakness.  MRI brain, small left lateral medullary infarct.  Pt reports dysphagia PTA over the last few months resulting in coughing with liquids and difficulties getting solid foods down.   In xray suite, pt reported sensation of food sticking at times pointing to chest area- this was not clear in clinical interview in room.     Assessment / Plan / Recommendation Clinical Impression  Dysphagia Diagnosis: Mild pharyngeal phase dysphagia (suspect mild esophageal dysphagia) Clinical impression: Pt presents with minimal oral and mild pharyngeal dysphagia with sensorimotor deficits.   Laryngeal elevation was compromised allowing laryngeal penetration of liquid and cracker particulate.  Cued throat clear removed trace penetration.  Chin tuck posture decr amount of liquid penetrated, and may be beneficial for pt to use if clinically decreases cough with liquids.  Pt with inadequate mastication of solids - stating she doesn't chew well as she eats rapidly.  Advised her to masticate foods thoroughly due to incre pulm risk if aspirates solids.  Suspect component of mild esophageal issues -- radiologist not present to confirm.  Advised pt to follow solids with liquids to aid esophageal clearance.  Given  dentition issues, rec pt consume mechanical soft/ground meat with thin liquids with strict precautions.  Given pt reports dysphagia x 2 months without acute worsening, suspect remote lateral medullary cva as source and subacute dysphagia.      Treatment Recommendation       Diet Recommendation Dysphagia 3 (Mechanical Soft);Thin liquid   Liquid Administration via: Cup;Straw Medication Administration:  (as tolerated, large crush if not contraindicated) Compensations: Slow rate;Small sips/bites;Check for pocketing;Follow solids with liquid;Clear throat intermittently Postural Changes and/or Swallow Maneuvers: Seated upright 90 degrees;Upright 30-60 min after meal    Other  Recommendations Oral Care Recommendations: Oral care BID   Follow Up Recommendations   (TBD)    Frequency and Duration min 2x/week  2 weeks   Pertinent Vitals/Pain Afebrile, decreased    SLP Swallow Goals Patient will utilize recommended strategies during swallow to increase swallowing safety with: Independent assistance   General Date of Onset: 07/03/12 HPI: HPI: 56 year old female with history of untreated high blood pressure who presented with left sided headache started prior to this admission associated with 1 episode of vomiting. Blood pressure in ED was 218/100 and it responded to catapres and metoprolol. Patient also had left sided numbness and weakness.  MRI brain, small left lateral medullary infarct.  Pt reports dysphagia PTA over the last few months resulting in coughing with liquids and difficulties getting solid foods down.   Type of Study: Modified Barium Swallowing Study Reason for Referral: Objectively evaluate swallowing function Previous Swallow Assessment: none Diet Prior to this Study: NPO Temperature Spikes Noted: No Respiratory Status: Room air History of Recent Intubation: No Behavior/Cognition: Alert;Cooperative;Pleasant mood Oral Cavity - Dentition: Adequate natural  dentition Oral Motor /  Sensory Function: Within functional limits Self-Feeding Abilities: Able to feed self Patient Positioning: Upright in bed Baseline Vocal Quality: Clear Volitional Cough: Strong Volitional Swallow: Able to elicit Anatomy: Within functional limits    Reason for Referral Objectively evaluate swallowing function   Oral Phase Oral Preparation/Oral Phase Oral Phase: Impaired Oral - Thin Oral - Thin Teaspoon: Within functional limits Oral - Thin Cup: Piecemeal swallowing;Delayed oral transit Oral - Thin Straw: Delayed oral transit;Piecemeal swallowing Oral - Solids Oral - Puree: Within functional limits Oral - Regular: Impaired mastication Oral - Pill: Delayed oral transit (pt swallowed pill without liquid)   Pharyngeal Phase Pharyngeal Phase Pharyngeal Phase: Impaired Pharyngeal - Thin Pharyngeal - Thin Teaspoon: Reduced anterior laryngeal mobility;Reduced laryngeal elevation;Reduced airway/laryngeal closure;Delayed swallow initiation;Premature spillage to pyriform sinuses Pharyngeal - Thin Cup: Reduced airway/laryngeal closure;Reduced laryngeal elevation;Reduced anterior laryngeal mobility;Pharyngeal residue - valleculae;Pharyngeal residue - pyriform sinuses;Compensatory strategies attempted (Comment);Penetration/Aspiration during swallow (trace stasis) Penetration/Aspiration details (thin cup): Material enters airway, remains ABOVE vocal cords and not ejected out Pharyngeal - Thin Straw: Reduced airway/laryngeal closure;Reduced laryngeal elevation;Pharyngeal residue - pyriform sinuses;Pharyngeal residue - valleculae;Compensatory strategies attempted (Comment);Penetration/Aspiration during swallow (trace stasis, dry swallow) Penetration/Aspiration details (thin straw): Material enters airway, remains ABOVE vocal cords and not ejected out Pharyngeal - Solids Pharyngeal - Puree: Within functional limits Pharyngeal - Regular: Penetration/Aspiration during swallow (trace penetration of solid  (particulate) vocal fold SILENT) Penetration/Aspiration details (regular): Material enters airway, CONTACTS cords and not ejected out (cued throat clear removed) Pharyngeal - Pill: Within functional limits  Cervical Esophageal Phase    GO    Cervical Esophageal Phase Cervical Esophageal Phase: Impaired Cervical Esophageal Phase - Solids Pill: Other (Comment) (barium tablet appeared to lodge at proximal esophagus) Cervical Esophageal Phase - Comment Cervical Esophageal Comment: pt swallowed barium tablet without liquid, sensed stasis at proximal esophagus, appearance of delayed clearance of esophagus- thin liquids aided clearance of pudding- at one point, pt sensed stasis when esophagus appeared clear:  radiologist not present to confirm    Functional Assessment Tool Used: mbs-clinical judgement Functional Limitations: Swallowing Swallow Current Status [id number]): At least 1 percent but less than 20 percent impaired, limited or restricted Swallow Goal Status [phone]): At least 1 percent but less than 20 percent impaired, limited or restricted Swallow Discharge Status [phone]): At least 1 percent but less than 20 percent impaired, limited or restricted    Luanna Salk, [place] [facility] SLP [phone]

## 2012-07-04 ENCOUNTER — Inpatient Hospital Stay (HOSPITAL_COMMUNITY)
Admission: AD | Admit: 2012-07-04 | Discharge: 2012-07-07 | DRG: 945 | Disposition: A | Discharge status: Home / Self Care | Payer: Medicaid Other | Source: Ambulatory Visit | Attending: Physical Medicine & Rehabilitation | Admitting: Physical Medicine & Rehabilitation

## 2012-07-04 DIAGNOSIS — E785 Hyperlipidemia, unspecified: Secondary | ICD-10-CM | POA: Diagnosis present

## 2012-07-04 DIAGNOSIS — I6330 Cerebral infarction due to thrombosis of unspecified cerebral artery: Secondary | ICD-10-CM

## 2012-07-04 DIAGNOSIS — R262 Difficulty in walking, not elsewhere classified: Secondary | ICD-10-CM | POA: Diagnosis present

## 2012-07-04 DIAGNOSIS — I6350 Cerebral infarction due to unspecified occlusion or stenosis of unspecified cerebral artery: Secondary | ICD-10-CM | POA: Diagnosis present

## 2012-07-04 DIAGNOSIS — H532 Diplopia: Secondary | ICD-10-CM | POA: Diagnosis present

## 2012-07-04 DIAGNOSIS — I639 Cerebral infarction, unspecified: Secondary | ICD-10-CM | POA: Diagnosis present

## 2012-07-04 DIAGNOSIS — Z5189 Encounter for other specified aftercare: Principal | ICD-10-CM

## 2012-07-04 DIAGNOSIS — I69993 Ataxia following unspecified cerebrovascular disease: Secondary | ICD-10-CM

## 2012-07-04 DIAGNOSIS — I10 Essential (primary) hypertension: Secondary | ICD-10-CM | POA: Diagnosis present

## 2012-07-04 DIAGNOSIS — R1310 Dysphagia, unspecified: Secondary | ICD-10-CM | POA: Diagnosis present

## 2012-07-04 DIAGNOSIS — F17200 Nicotine dependence, unspecified, uncomplicated: Secondary | ICD-10-CM | POA: Diagnosis present

## 2012-07-04 MED ORDER — ACETAMINOPHEN 325 MG PO TABS
325.0000 mg | ORAL_TABLET | ORAL | Status: DC | PRN
  Administered 2012-07-07: 650 mg via ORAL
  Filled 2012-07-04: qty 2

## 2012-07-04 MED ORDER — SIMVASTATIN 20 MG PO TABS
20.0000 mg | ORAL_TABLET | Freq: Every day | ORAL | Status: DC
  Administered 2012-07-04 – 2012-07-06 (×3): 20 mg via ORAL
  Filled 2012-07-04 (×4): qty 1

## 2012-07-04 MED ORDER — FLEET ENEMA 7-19 GM/118ML RE ENEM
1.0000 | ENEMA | Freq: Once | RECTAL | Status: AC | PRN
  Filled 2012-07-04: qty 1

## 2012-07-04 MED ORDER — BISACODYL 10 MG RE SUPP: 10.0000 mg | Freq: Every day | RECTAL | Status: DC | PRN

## 2012-07-04 MED ORDER — PROCHLORPERAZINE EDISYLATE 5 MG/ML IJ SOLN
5.0000 mg | Freq: Four times a day (QID) | INTRAMUSCULAR | Status: DC | PRN
  Filled 2012-07-04: qty 2

## 2012-07-04 MED ORDER — GUAIFENESIN-DM 100-10 MG/5ML PO SYRP: 5.0000 mL | ORAL_SOLUTION | Freq: Four times a day (QID) | ORAL | Status: DC | PRN

## 2012-07-04 MED ORDER — DIPHENHYDRAMINE HCL 12.5 MG/5ML PO ELIX
12.5000 mg | ORAL_SOLUTION | Freq: Four times a day (QID) | ORAL | Status: DC | PRN
  Filled 2012-07-04: qty 10

## 2012-07-04 MED ORDER — PROCHLORPERAZINE MALEATE 5 MG PO TABS
5.0000 mg | ORAL_TABLET | Freq: Four times a day (QID) | ORAL | Status: DC | PRN
  Filled 2012-07-04: qty 2

## 2012-07-04 MED ORDER — ALUM & MAG HYDROXIDE-SIMETH 200-200-20 MG/5ML PO SUSP: 30.0000 mL | ORAL | Status: DC | PRN

## 2012-07-04 MED ORDER — HYDRALAZINE HCL 50 MG PO TABS
50.0000 mg | ORAL_TABLET | Freq: Three times a day (TID) | ORAL | Status: DC
  Administered 2012-07-04 – 2012-07-07 (×9): 50 mg via ORAL
  Filled 2012-07-04 (×11): qty 1

## 2012-07-04 MED ORDER — PROCHLORPERAZINE 25 MG RE SUPP
12.5000 mg | Freq: Four times a day (QID) | RECTAL | Status: DC | PRN
  Filled 2012-07-04: qty 1

## 2012-07-04 MED ORDER — HYDRALAZINE HCL 50 MG PO TABS
50.0000 mg | ORAL_TABLET | Freq: Three times a day (TID) | ORAL | Status: DC
  Filled 2012-07-04 (×2): qty 1

## 2012-07-04 MED ORDER — FLUTICASONE PROPIONATE 50 MCG/ACT NA SUSP
1.0000 | Freq: Every day | NASAL | Status: DC
Start: 2012-07-05 — End: 2012-07-07
  Administered 2012-07-05 – 2012-07-07 (×3): 1 via NASAL
  Filled 2012-07-04 (×2): qty 16

## 2012-07-04 MED ORDER — ASPIRIN 325 MG PO TABS
325.0000 mg | ORAL_TABLET | Freq: Every day | ORAL | Status: DC
  Administered 2012-07-05 – 2012-07-07 (×3): 325 mg via ORAL
  Filled 2012-07-04 (×4): qty 1

## 2012-07-04 MED ORDER — POLYETHYLENE GLYCOL 3350 17 G PO PACK
17.0000 g | PACK | Freq: Every day | ORAL | Status: DC | PRN
  Filled 2012-07-04: qty 1

## 2012-07-04 MED ORDER — ENOXAPARIN SODIUM 40 MG/0.4ML ~~LOC~~ SOLN
40.0000 mg | SUBCUTANEOUS | Status: DC
  Administered 2012-07-05 – 2012-07-06 (×2): 40 mg via SUBCUTANEOUS
  Filled 2012-07-04 (×4): qty 0.4

## 2012-07-04 MED ORDER — TRAZODONE HCL 50 MG PO TABS: 25.0000 mg | ORAL_TABLET | Freq: Every evening | ORAL | Status: DC | PRN

## 2012-07-04 NOTE — Discharge Summary (Signed)
Physician Discharge Summary  Patient ID: Catherine Aguilar MRN: [id number] DOB/AGE: 57/27/1957 57 y.o.  Admit date: 07/01/2012 Discharge date: 07/04/2012  PCP: She has been provided with a list of PCP's  DISCHARGE DIAGNOSES:  Principal Problem:  *CVA (cerebral vascular accident) Active Problems:  Accelerated hypertension  Left facial numbness   RECOMMENDATIONS TO PCP: 1. Allowing permissive hypertension for now due to acute stroke 2. After 1 week she will need better BP control 3. When discharging from rehab please discharge on "$4 meds."   DISCHARGE CONDITION: fair  INITIAL HISTORY: 56 year old female with history of untreated high blood pressure who presented with left sided headache started prior to this admission associated with 1 episode of vomiting. Blood pressure in ED was 218/100 and it responded to catapres and metoprolol. Patient also had left sided numbness and weakness.  HOSPITAL COURSE:   Acute CVA (Left Medullary)  Patient underwent MRI, which showed a stroke. Please see report below. She had not taken her aspirin since earlier this year and so, aspirin was recommended. This will be continued. Patient was seen by neurology, and they recommended MRA of the head and neck. These were performed and the reports are as below. She also underwent an echocardiogram, which was not significant for any abnormalities. She was seen by physical and occupation therapy, and they recommended inpatient rehabilitation. Patient will be discharged to inpatient rehabilitation today. Her LDL was 98. We will recommend initiating statin medication. She also had some dysphagia for which a swallow evaluation was performed. Dysphagia 3 diet was recommended. She continues to have left face numbness but feels well otherwise.  Accelerated hypertension  She has been off of her medications for many months. Her blood pressure initially was in the 123456 systolics and greater than 123XX123 diastolic. Patient  was initiated on her antihypertensive medication regimen and her blood pressure came down to normal range. However, subsequently, a stroke, was detected and so we allowed permissive hypertension. Aggressive blood pressure control can be achieved in 1 to 2 weeks. She will likely require additional antihypertensive agents. I would choose a diuretic in the form of hydrochlorothiazide as the next agent. BP is reasonable on current doses of hydralazine. Once, again, we're tolerating a higher than normal blood pressure due to her stroke.  Her other medical conditions are stable. She is ready for discharge at this time.    IMAGING STUDIES Ct Head Wo Contrast  07/01/2012  *RADIOLOGY REPORT*  Clinical Data: Left parietal headaches  CT HEAD WITHOUT CONTRAST  Technique:  Contiguous axial images were obtained from the base of the skull through the vertex without contrast.  Comparison: None.  Findings: Ventricle size is normal.  Negative for infarct or mass. Negative for intracranial hemorrhage or fluid collection.  Calvarium is intact.  Sinusitis with air-fluid levels in the maxillary sinus bilaterally. Mucosal edema and fluid in the sphenoid sinus bilaterally.  There is mucosal thickening in the ethmoid sinuses.  Mastoid sinuses are clear.  IMPRESSION: Normal CT of the brain.  Sinusitis with multiple air-fluid levels in the sinuses.   Original Report Authenticated By: Truett Perna, M.D.    Mr [facility] Wo Contrast  07/03/2012  *RADIOLOGY REPORT*  Clinical Data:  Left medullary infarct.  Hypertension  MRA HEAD WITHOUT CONTRAST  Technique:  Angiographic images of the Circle of Willis were obtained using MRA technique without intravenous contrast.  Comparison:  MRI 07/02/2012  Findings:  Both vertebral arteries are patent to the basilar. Posterior circulation is  relatively small due to fetal origin of the posterior cerebral artery bilaterally.  No significant vertebral stenosis.  Small right PICA is visualized.  Left  PICA is not visualized and may be hypoplastic or occluded.  Small AICA is present bilaterally.  Basilar is congenitally small and ends in the superior cerebellar artery bilaterally.  Posterior cerebral arteries are both supplied from the internal carotid artery with hypoplastic distal basilar and P1 segments.  Internal carotid artery is patent bilaterally without stenosis. Anterior and middle cerebral arteries are patent bilaterally.  Negative for cerebral aneurysm.  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  Small right PICA is visualized.  Left PICA is not visualized and may be occluded or this territory may be supplied from the left AICA.  The patient has a left medullary infarct which is supplied by pica.  *RADIOLOGY REPORT*  Clinical Data:  Left medullary infarct  MRA NECK WITHOUT CONTRAST  Technique:  Angiographic images of the neck were obtained using MRA technique without intravenous contrast.  Carotid stenosis measurements (when applicable) are obtained utilizing NASCET criteria, using the distal internal carotid diameter as the denominator.  Comparison:  MRI head 07/02/2012  Findings:  The patient did receive 15 ml Multihance IV however became uncooperative and refused to continue.  No postcontrast imaging was performed.  Precontrast imaging was performed.  Both vertebral arteries are patent with antegrade flow.  There is scattered areas of   signal loss in the vertebral arteries which is most likely due to artifact from tortuosity.  I cannot exclude vertebral stenosis or dissection on the study.  Both internal carotid arteries are patent.  There is slight irregularity of the carotid bifurcation, favor to be artifact rather than atherosclerotic disease.  There is signal loss in the internal carotid artery the skull base, felt to be signal loss.  No high-grade carotid stenosis.  IMPRESSION: Unenhanced only  imaging was obtained as the patient was not able to cooperate with postcontrast imaging.  Both vertebral arteries are patent to the basilar.  Both carotid arteries are patent to the skull base.  There are numerous areas of signal loss in both vertebral arteries and carotid arteries, felt to be due to artifact.  Lack of intravenous contrast does limit resolution of the study.   Original Report Authenticated By: Truett Perna, M.D.    Mr Angiogram Neck Wo Contrast  07/03/2012  *RADIOLOGY REPORT*  Clinical Data:  Left medullary infarct.  Hypertension  MRA HEAD WITHOUT CONTRAST  Technique:  Angiographic images of the Circle of Willis were obtained using MRA technique without intravenous contrast.  Comparison:  MRI 07/02/2012  Findings:  Both vertebral arteries are patent to the basilar. Posterior circulation is relatively small due to fetal origin of the posterior cerebral artery bilaterally.  No significant vertebral stenosis.  Small right PICA is visualized.  Left PICA is not visualized and may be hypoplastic or occluded.  Small AICA is present bilaterally.  Basilar is congenitally small and ends in the superior cerebellar artery bilaterally.  Posterior cerebral arteries are both supplied from the internal carotid artery with hypoplastic distal basilar and P1 segments.  Internal carotid artery is patent bilaterally without stenosis. Anterior and middle cerebral arteries are patent bilaterally.  Negative for cerebral aneurysm.  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  Small right PICA is visualized.  Left  PICA is not visualized and may be occluded or this territory may be supplied from the left AICA.  The patient has a left medullary infarct which is supplied by pica.  *RADIOLOGY REPORT*  Clinical Data:  Left medullary infarct  MRA NECK WITHOUT CONTRAST  Technique:  Angiographic images of the neck were obtained  using MRA technique without intravenous contrast.  Carotid stenosis measurements (when applicable) are obtained utilizing NASCET criteria, using the distal internal carotid diameter as the denominator.  Comparison:  MRI head 07/02/2012  Findings:  The patient did receive 15 ml Multihance IV however became uncooperative and refused to continue.  No postcontrast imaging was performed.  Precontrast imaging was performed.  Both vertebral arteries are patent with antegrade flow.  There is scattered areas of   signal loss in the vertebral arteries which is most likely due to artifact from tortuosity.  I cannot exclude vertebral stenosis or dissection on the study.  Both internal carotid arteries are patent.  There is slight irregularity of the carotid bifurcation, favor to be artifact rather than atherosclerotic disease.  There is signal loss in the internal carotid artery the skull base, felt to be signal loss.  No high-grade carotid stenosis.  IMPRESSION: Unenhanced only imaging was obtained as the patient was not able to cooperate with postcontrast imaging.  Both vertebral arteries are patent to the basilar.  Both carotid arteries are patent to the skull base.  There are numerous areas of signal loss in both vertebral arteries and carotid arteries, felt to be due to artifact.  Lack of intravenous contrast does limit resolution of the study.   Original Report Authenticated By: Truett Perna, M.D.    Mr Brain Wo Contrast  07/02/2012  *RADIOLOGY REPORT*  Clinical Data: Hypertensive crisis.  Longstanding uncontrolled hypertension.  MRI HEAD WITHOUT CONTRAST  Technique:  Multiplanar, multiecho pulse sequences of the brain and surrounding structures were obtained according to standard protocol without intravenous contrast.  Comparison:  CT of the head without contrast.  Findings: An acute non hemorrhagic infarct is present within the posterior left lateral medulla.  T2 hyperintensity is noted in the area as well,  compatible with the time frame of at least several hours.  No other acute infarct is present.  There is no hemorrhage or mass lesion.  Mild scattered periventricular subcortical T2 hyperintensities are slightly greater than expected for age.  The ventricles are normal size.  No significant extra-axial fluid collection is present.  The flow is present in the major intracranial arteries.  The globes and orbits are intact.  Fluid levels are present in the maxillary and sphenoid sinuses bilaterally.  There are scattered fluid levels in the ethmoid air cells.  Circumferential mucosal thickening is noted in the right frontal sinus.  The mastoid air cells are clear.  IMPRESSION: 1.  Acute / subacute non hemorrhagic left lateral medullary infarct. 2.  Scattered periventricular and subcortical T2 hyperintensities are slightly greater than expected for age. The finding is nonspecific but can be seen in the setting of chronic microvascular ischemia, a demyelinating process such as multiple sclerosis, vasculitis, complicated migraine headaches, or as the sequelae of a prior infectious or inflammatory process. 3.  Multiple fluid levels throughout the paranasal sinuses, consistent with acute sinusitis.   Original Report Authenticated By: Resa Miner. MATTERN, M.D.    Dg Swallowing Func-speech Pathology  07/03/2012  Macario Golds, [place]     07/03/2012 10:43 AM Objective Swallowing Evaluation: Modified Barium Swallowing Study   Patient  Details  Name: Catherine Aguilar MRN: [id number] Date of Birth: Nov 04, 1955  Today's Date: 07/03/2012 Time: [id number] SLP Time Calculation (min): 41 min  Past Medical History:  Past Medical History  Diagnosis Date  . Hypertension    Past Surgical History:  Past Surgical History  Procedure Date  . Abdominal hysterectomy 1985   HPI:  HPI: 56 year old female with history of untreated high blood  pressure who presented with left sided headache started prior to  this admission associated with 1  episode of vomiting. Blood  pressure in ED was 218/100 and it responded to catapres and  metoprolol. Patient also had left sided numbness and weakness.   MRI brain, small left lateral medullary infarct.  Pt reports  dysphagia PTA over the last few months resulting in coughing with  liquids and difficulties getting solid foods down.   In xray  suite, pt reported sensation of food sticking at times pointing  to chest area- this was not clear in clinical interview in room.     Assessment / Plan / Recommendation Clinical Impression  Dysphagia Diagnosis: Mild pharyngeal phase dysphagia (suspect  mild esophageal dysphagia) Clinical impression: Pt presents with minimal oral and mild  pharyngeal dysphagia with sensorimotor deficits.   Laryngeal  elevation was compromised allowing laryngeal penetration of  liquid and cracker particulate.  Cued throat clear removed trace  penetration.  Chin tuck posture decr amount of liquid penetrated,  and may be beneficial for pt to use if clinically decreases cough  with liquids.  Pt with inadequate mastication of solids - stating  she doesn't chew well as she eats rapidly.  Advised her to  masticate foods thoroughly due to incre pulm risk if aspirates  solids.  Suspect component of mild esophageal issues --  radiologist not present to confirm.  Advised pt to follow solids  with liquids to aid esophageal clearance.  Given dentition  issues, rec pt consume mechanical soft/ground meat with thin  liquids with strict precautions.  Given pt reports dysphagia x 2  months without acute worsening, suspect remote lateral medullary  cva as source and subacute dysphagia.      Treatment Recommendation       Diet Recommendation Dysphagia 3 (Mechanical Soft);Thin liquid   Liquid Administration via: Cup;Straw Medication Administration:  (as tolerated, large crush if not  contraindicated) Compensations: Slow rate;Small sips/bites;Check for  pocketing;Follow solids with liquid;Clear throat intermittently  Postural Changes and/or Swallow Maneuvers: Seated upright 90  degrees;Upright 30-60 min after meal    Other  Recommendations Oral Care Recommendations: Oral care BID   Follow Up Recommendations   (TBD)    Frequency and Duration min 2x/week  2 weeks   Pertinent Vitals/Pain Afebrile, decreased    SLP Swallow Goals Patient will utilize recommended strategies during swallow to  increase swallowing safety with: Independent assistance   General Date of Onset: 07/03/12 HPI: HPI: 56 year old female with history of untreated high blood  pressure who presented with left sided headache started prior to  this admission associated with 1 episode of vomiting. Blood  pressure in ED was 218/100 and it responded to catapres and  metoprolol. Patient also had left sided numbness and weakness.   MRI brain, small left lateral medullary infarct.  Pt reports  dysphagia PTA over the last few months resulting in coughing with  liquids and difficulties getting solid foods down.   Type of Study: Modified Barium Swallowing Study Reason for Referral: Objectively evaluate swallowing function Previous Swallow Assessment: none  Diet Prior to this Study: NPO Temperature Spikes Noted: No Respiratory Status: Room air History of Recent Intubation: No Behavior/Cognition: Alert;Cooperative;Pleasant mood Oral Cavity - Dentition: Adequate natural dentition Oral Motor / Sensory Function: Within functional limits Self-Feeding Abilities: Able to feed self Patient Positioning: Upright in bed Baseline Vocal Quality: Clear Volitional Cough: Strong Volitional Swallow: Able to elicit Anatomy: Within functional limits    Reason for Referral Objectively evaluate swallowing function   Oral Phase Oral Preparation/Oral Phase Oral Phase: Impaired Oral - Thin Oral - Thin Teaspoon: Within functional limits Oral - Thin Cup: Piecemeal swallowing;Delayed oral transit Oral - Thin Straw: Delayed oral transit;Piecemeal swallowing Oral - Solids Oral - Puree: Within functional  limits Oral - Regular: Impaired mastication Oral - Pill: Delayed oral transit (pt swallowed pill without  liquid)   Pharyngeal Phase Pharyngeal Phase Pharyngeal Phase: Impaired Pharyngeal - Thin Pharyngeal - Thin Teaspoon: Reduced anterior laryngeal  mobility;Reduced laryngeal elevation;Reduced airway/laryngeal  closure;Delayed swallow initiation;Premature spillage to pyriform  sinuses Pharyngeal - Thin Cup: Reduced airway/laryngeal closure;Reduced  laryngeal elevation;Reduced anterior laryngeal  mobility;Pharyngeal residue - valleculae;Pharyngeal residue -  pyriform sinuses;Compensatory strategies attempted  (Comment);Penetration/Aspiration during swallow (trace stasis) Penetration/Aspiration details (thin cup): Material enters  airway, remains ABOVE vocal cords and not ejected out Pharyngeal - Thin Straw: Reduced airway/laryngeal closure;Reduced  laryngeal elevation;Pharyngeal residue - pyriform  sinuses;Pharyngeal residue - valleculae;Compensatory strategies  attempted (Comment);Penetration/Aspiration during swallow (trace  stasis, dry swallow) Penetration/Aspiration details (thin straw): Material enters  airway, remains ABOVE vocal cords and not ejected out Pharyngeal - Solids Pharyngeal - Puree: Within functional limits Pharyngeal - Regular: Penetration/Aspiration during swallow  (trace penetration of solid (particulate) vocal fold SILENT) Penetration/Aspiration details (regular): Material enters airway,  CONTACTS cords and not ejected out (cued throat clear removed) Pharyngeal - Pill: Within functional limits  Cervical Esophageal Phase    GO    Cervical Esophageal Phase Cervical Esophageal Phase: Impaired Cervical Esophageal Phase - Solids Pill: Other (Comment) (barium tablet appeared to lodge at  proximal esophagus) Cervical Esophageal Phase - Comment Cervical Esophageal Comment: pt swallowed barium tablet without  liquid, sensed stasis at proximal esophagus, appearance of  delayed clearance of esophagus-  thin liquids aided clearance of  pudding- at one point, pt sensed stasis when esophagus appeared  clear:  radiologist not present to confirm    Functional Assessment Tool Used: mbs-clinical judgement Functional Limitations: Swallowing Swallow Current Status [id number]): At least 1 percent but less than  20 percent impaired, limited or restricted Swallow Goal Status [phone]): At least 1 percent but less than 20  percent impaired, limited or restricted Swallow Discharge Status [phone]): At least 1 percent but less  than 20 percent impaired, limited or restricted    Luanna Salk, MS [facility] SLP [phone]      DISCHARGE EXAMINATION: Blood pressure 175/94, pulse 75, temperature 98.5 F (36.9 C), temperature source Oral, resp. rate 16, height 5\' 9"  (1.753 m), weight 75.2 kg (165 lb 12.6 oz), SpO2 100.00%. General appearance: alert, cooperative, appears stated age and no distress Resp: clear to auscultation bilaterally Cardio: regular rate and rhythm, S1, S2 normal, no murmur, click, rub or gallop GI: soft, non-tender; bowel sounds normal; no masses,  no organomegaly Neurologic: Sensory deficits left face. Otherwise unremarkable.  DISPOSITION: CIR  Discharge medications per Rehab MD when discharged from Rehab.   TOTAL DISCHARGE TIME: 35 mins  [facility] Pager [phone]  07/04/2012, 10:17 AM

## 2012-07-04 NOTE — Progress Notes (Signed)
Rehab admissions - Evaluated for possible admission.  I spoke with patient.  She would like to come to inpatient rehab for a short stay prior to home with husband.  Bed available and can admit to inpatient rehab today.  Call me for questions.  [id number]

## 2012-07-04 NOTE — Progress Notes (Signed)
Discharged to [facility]  In [facility], alert and oriented, report given to [facility]. Transported  by [facility].

## 2012-07-04 NOTE — PMR Pre-admission (Signed)
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 Has facial numbness and tingling as well  History of Present Illness:  A 56 y.o. female with history of untreated HTN who was admitted on 07/01/12 with history of left sided HA and unsteadiness. BP at admission 218/100. CT head negative. 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 2 D echo done revealing EF 60-65% and no wall abnormalities. Carotid dopplers without ICA stenosis. Neurology consulted and recommended full work up as well as MRA neck to rule out dissection and ASA for CVA prophylaxis. MRA head/neck done revealing hypoplastic posterior circulation, small right PICA and left PICA not visualized, full study limited due to patient's uncooperativeness. MBS done revealing mild dysphagia and patient started on D3, thin liquids. Therapy evaluations done today and patient noted to be impulsive and have gait/ balance deficits. MD, therapy team recommending CIR.  Reportedly independent prior to admission. Husband lives at home he is on disability    Past Medical History  Past Medical History  Diagnosis Date  . Hypertension     stopped taking medications 10 years ago   Family History  family history includes Cancer in her father and Heart attack in her mother.  Prior Rehab/Hospitalizations:  No previous rehab.  No previous hospital admissions.   Current Medications  Current facility-administered medications:acetaminophen (TYLENOL) suppository 650 mg, 650 mg, Rectal,  Q6H PRN, Robbie Lis, MD;  acetaminophen (TYLENOL) tablet 650 mg, 650 mg, Oral, Q6H PRN, Robbie Lis, MD;  aspirin tablet 325 mg, 325 mg, Oral, Daily, Bonnielee Haff, MD, 325 mg at 07/04/12 1011;  enoxaparin (LOVENOX) injection 40 mg, 40 mg, Subcutaneous, Q24H, Bonnielee Haff, MD, 40 mg at 07/03/12 1253 fluticasone (FLONASE) 50 MCG/ACT nasal spray 1 spray, 1 spray, Each Nare, Daily, Bonnielee Haff, MD, 1 spray at 07/04/12 1011;  hydrALAZINE (APRESOLINE) injection 10 mg, 10 mg, Intravenous, Q6H PRN, Bonnielee Haff, MD, 10 mg at 07/04/12 0500;  hydrALAZINE (APRESOLINE) tablet 50 mg, 50 mg, Oral, Q8H, Bonnielee Haff, MD;  simvastatin (ZOCOR) tablet 20 mg, 20 mg, Oral, q1800, Bonnielee Haff, MD, 20 mg at 07/03/12 1735 sodium chloride 0.9 % injection 3 mL, 3 mL, Intravenous, Q12H, Robbie Lis, MD, 3 mL at 07/03/12 2200;  DISCONTD: hydrALAZINE (APRESOLINE) tablet 25 mg, 25 mg, Oral, Q6H, Robbie Lis, MD, 25 mg at 07/04/12 0400  Patients Current Diet: Cardiac  Precautions / Restrictions Precautions Precautions: Fall Restrictions Weight Bearing Restrictions: No   Prior Activity Level Community (5-7x/wk): Went out daily.  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[place] Devices/Equipment: Eyeglasses;Dentures (specify type) (full set dentures ) Home Adaptive Equipment: None  Prior Functional Level Prior Function Level of Independence: Independent Able to Take Stairs?: Yes  Current Functional Level Cognition  Arousal/Alertness: Awake/alert Overall Cognitive Status: Impaired Orientation Level: Oriented X4 Safety/Judgement: Impulsive;Decreased awareness of need for assistance;Decreased safety judgement for tasks assessed Cognition - Other Comments: Pt trying to get up on her own when therapists entered room and observed multiple cords under feet and SCDs still  connected. Emphasized need to call for assist. Pt is impulsive and moves quickly.      Extremity Assessment (includes Sensation/Coordination)  RUE ROM/Strength/Tone: WFL for tasks assessed RUE Sensation: WFL - Light Touch RUE Coordination: WFL - gross/fine motor  RLE ROM/Strength/Tone: WFL for tasks assessed RLE Sensation: Deficits RLE Sensation Deficits: Impaired perception of body in space. Pt attempts to compensate with lateral head/neck flexion to R side and by leaning to R side, at times overcompensating.  RLE Coordination: Deficits RLE Coordination Deficits: Ataxic gait.    ADLs  Eating/Feeding: Simulated;Independent Where Assessed - Eating/Feeding: Chair Grooming: Simulated;Wash/dry hands;Set up Where Assessed - Grooming: Supported sitting Upper Body Bathing: Simulated;Chest;Right arm;Left arm;Abdomen;Supervision/safety;Other (comment);Set up (supervision for safety as pt impulsive) Where Assessed - Upper Body Bathing: Unsupported sitting Lower Body Bathing: Simulated;Minimal assistance;Other (comment) (for balance, pt with LOB to L) Where Assessed - Lower Body Bathing: Supported sit to stand Upper Body Dressing: Simulated;Supervision/safety;Set up Where Assessed - Upper Body Dressing: Unsupported sitting Lower Body Dressing: Simulated;Minimal assistance;Other (comment) (as above for balance. ) Where Assessed - Lower Body Dressing: Supported sit to stand Toilet Transfer: Performed;Minimal assistance Toilet Transfer Method: Stand Ecologist: Bedside commode;Other (comment) (pt about to get up to [facility] on her own. ) Toileting - Water quality scientist and Hygiene: Simulated;Minimal assistance Where Assessed - Best boy and Hygiene: Sit to stand from 3-in-1 or toilet Tub/Shower Transfer Method: Not assessed Equipment Used: Rolling walker ADL Comments: Seen with PT. Initially didnt use an assistive device and pt required min to mod assist for balance with LOB to the L. Used RW for functional mobility and pt with improved  balance requiring min assist. Pt is impulsive and moves quickly. She states she is used to doing things "fast" and was attempting to get up to [facility] by herself as therapists entered the room . Emphasized the need for her to call for assist due to her balance deficits. Pt requires min assist for dynamic balance tasks such as pulling up and down mesh underwear.     Mobility  Bed Mobility: Sit to Supine Supine to Sit: 5: Supervision Sit to Supine: 6: Modified independent (Device/Increase time);HOB elevated    Transfers  Transfers: Sit to Stand;Stand to Sit Sit to Stand: 4: Min guard;From bed;From chair/3-in-1 Stand to Sit: 4: Min guard;To bed;To chair/3-in-1    Ambulation / Gait / Stairs / Emergency planning/management officer  Ambulation/Gait Ambulation/Gait Assistance: 4: Min Wellsite geologist (Feet): 200 Feet Assistive device: Rolling walker Ambulation/Gait Assistance Details: Improved gait this session. Less cueing needed for pt to slow pace. Intermittent assist needed for stability. Noted leaning to L side with R head tilt still, narrow BOS with intermittent scissoring, and impaired control/placement of L LE during swing through and initial contact phases of gait. VCs safety, pacing, posture.  Gait Pattern: Narrow base of support;Scissoring;Step-through pattern;Trunk flexed    Posture / Balance Static Standing Balance Static Standing - Balance Support: No upper extremity supported Static Standing - Level of Assistance: 4: Min assist Static Standing - Comment/# of Minutes: Had pt stand EO/EC-able to maintain without LOB with close supervision assist. Performed external perturbations, mulitidirectional-close supervision assist. Pt attempted to stand statically with narrow BOS-pt unable to achieve position without 1 hand assist. Unable to maintain-LOB to L side repeatedly, requiring Min assist to prevent fall.  Dynamic Standing Balance Dynamic Standing - Level of Assistance: 4: Min assist;Other  (comment) (to pull up and down mesh underwear) Berg Balance Test Sit to Stand: Able to  stand without using hands and stabilize independently Standing Unsupported: Able to stand 2 minutes with supervision Sitting with Back Unsupported but Feet Supported on Floor or Stool: Able to sit safely and securely 2 minutes Stand to Sit: Sits safely with minimal use of hands Transfers: Able to transfer safely, definite need of hands Standing Unsupported with Eyes Closed: Able to stand 10 seconds with supervision Standing Ubsupported with Feet Together: Needs help to attain position and unable to hold for 15 seconds From Standing, Reach Forward with Outstretched Arm: Can reach forward >12 cm safely (5") From Standing Position, Pick up Object from Floor: Able to pick up shoe, needs supervision From Standing Position, Turn to Look Behind Over each Shoulder: Needs assist to keep from losing balance and falling Turn 360 Degrees: Able to turn 360 degrees safely but slowly Standing Unsupported, Alternately Place Feet on Step/Stool: Needs assistance to keep from falling or unable to try Standing Unsupported, One Foot in Front: Loses balance while stepping or standing Standing on One Leg: Unable to try or needs assist to prevent fall Total Score: 29      Previous Home Environment Living Arrangements: Spouse/significant other Lives With: Spouse Available Help at Discharge: Other (Comment) (spouse on disability) Type of Home: House Home Layout: One level Home Access: Stairs to enter [employer] of Steps: 1 [place]: No  Discharge Living Setting Plans for Discharge Living Setting: Patient's home;House;Lives with (comment) (Lives with husband.) Type of Home at Discharge: House Discharge Home Layout: One level Discharge Home Access: Stairs to enter Entrance Stairs-Number of Steps: 2 steps + 1 up from porch. Do you have any problems obtaining your medications?:  No  Social/Family/Support Systems Patient Roles: Spouse Contact Information: Alinea Siess - husband (h) [phone] (c) [phone] Anticipated Caregiver: Husband Ability/Limitations of Caregiver: Husband is on disability but can assist  Caregiver Availability: 24/7 Discharge Plan Discussed with Primary Caregiver: Yes Is Caregiver In Agreement with Plan?: Yes Does Caregiver/Family have Issues with Lodging/Transportation while Pt is in Rehab?: No  Goals/Additional Needs Patient/Family Goal for Rehab: PT/OT mod I, likely will need ST eval. Expected length of stay: 7-10 days Cultural Considerations: None Dietary Needs: Heart Diet Equipment Needs: TBD Pt/Family Agrees to Admission and willing to participate: Yes Program Orientation Provided & Reviewed with Pt/Caregiver Including Roles  & Responsibilities: Yes  Patient Condition: This patient's condition remains as documented in the Consult dated 07/03/12, in which the Rehabilitation Physician determined and documented that the patient's condition is appropriate for intensive rehabilitative care in an inpatient rehabilitation facility.  Preadmission Screen Completed By:  Retta Diones, 07/04/2012 11:52 AM ______________________________________________________________________   Discussed status with Dr. Letta Pate on 07/04/12 at 1158 and received telephone approval for admission today.  Admission Coordinator:  Retta Diones, time1158/Date10/25/13
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
## 2012-07-04 NOTE — Progress Notes (Signed)
Patient arrived to 4140 via care link transport. Patient oriented to the room, bed controls, whiteboard, meal schedule, therapy shecdule, team conference, safety plan, agreement and video, rehab routine.Preferred name and goal for rehab posted at head of bed. Please see CHL for details of assessment. Blood pressure on admission elevated, dynamap 183/81, manual 160/98. Algis Liming, PA notified, no new orders given at this time. Catherine Aguilar, Catherine Aguilar

## 2012-07-04 NOTE — Progress Notes (Signed)
Nursing Note: Pt last BM on 07/02/12 .Offered med for constipation and pt declined.She states sne is not normally regular and did not want to take anything yet.wbb

## 2012-07-04 NOTE — H&P (Signed)
Physical Medicine and Rehabilitation Admission H&P    Chief Complaint  Patient presents with  . Hypertension  . Balance problems wit difficulty walking  : HPI: Catherine Aguilar is a 56 y.o. female with history of untreated HTN (no meds X 10 years) who was admitted on 07/01/12 with funny sensation left face and unsteadiness. BP at admission 218/100. CT head negative. 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 2 D echo done revealing EF 60-65% and no wall abnormalities. Carotid dopplers without ICA stenosis. Neurology consulted and recommended full work up as well as MRA neck to rule out dissection and ASA for CVA prophylaxis. MRA head/neck done revealing hypoplastic posterior circulation, small right PICA and left PICA not visualized, full study limited due to patient's uncooperativeness. MBS done revealing mild dysphagia and patient started on D3, thin liquids. Therapy evaluations done and patient noted to be impulsive and have gait/ balance deficits. MD, therapy team recommending CIR  Patient denies dizziness or hiccups but has problems focusing her vision Review of Systems  HENT: Negative for hearing loss.   Eyes: Positive for blurred vision and double vision.  Respiratory: Negative for cough and shortness of breath.   Cardiovascular: Negative for chest pain and palpitations.  Gastrointestinal: Negative for heartburn, nausea and constipation.  Genitourinary: Negative for urgency and frequency.  Musculoskeletal: Negative for myalgias.  Neurological: Positive for dizziness (with quick movements--?gaze instability. ) and sensory change (numbness left face). Negative for headaches.   Past Medical History  Diagnosis Date  . Hypertension     stopped taking medications 10 years ago   Past Surgical History  Procedure Date  . Abdominal hysterectomy 1985   Family History  Problem Relation Age of Onset  . Cancer Father   . Heart attack Mother    Social History: Married. 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 She reports that she has been smoking Cigarettes- a pack every 2 weeks since age 63. She has a 4 pack-year smoking history. She has never used smokeless tobacco. She reports that she drinks alcohol--3 shots of gin and 2-- 40 ounce beer on weekends. . She reports that she does not use illicit drugs. Husband disabled due to back problems   Allergies: No Known Allergies  Scheduled Meds:    . aspirin  325 mg Oral Daily  . enoxaparin  40 mg Subcutaneous Q24H  . fluticasone  1 spray Each Nare Daily  . hydrALAZINE  50 mg Oral Q8H  . simvastatin  20 mg Oral q1800    Home:     Functional History:    Functional Status:  Mobility:          ADL:    Cognition: Cognition Orientation Level: Oriented X4     Blood pressure 160/98, pulse 88, temperature 98.4 F (36.9 C), temperature source Oral, resp. rate 18, weight 76.2 kg (167 lb 15.9 oz), SpO2 100.00%. Physical Exam  Nursing note and vitals reviewed. Constitutional: She is oriented to person, place, and time. She appears well-developed and well-nourished.  HENT:  Head: Normocephalic and atraumatic.  Eyes: Pupils are equal, round, and reactive to light.  Neck: Normal range of motion.  Cardiovascular: Normal rate and regular rhythm.   Pulmonary/Chest: Effort normal and breath sounds normal.  Abdominal: Soft. Bowel sounds are normal.  Musculoskeletal: She exhibits no edema and no tenderness.  Neurological: She is alert and oriented to person, place, and time.  Speech clear.  Follows multistep commands without difficutly. Decrease in fine motor movement of LUE.  Horizontal nystagmus to left lateral fields with reports of diplopia.   Skin: Skin is warm and dry.  Intranuclear ophthalmoplegia noted  Motor strength is 5/5 in bilateral deltoid, biceps, triceps, grip as well as hip flexors, knee extensors, ankle dorsi flexors and plantar  flexors Sensation in her Face is reduced On the left side Ataxia on finger nose to finger testing as well as heel-to-shin testing on the left side  LIPID PANEL     Status: Abnormal   Collection Time   07/03/12  5:05 AM      Component Value Range Comment   Cholesterol 161  0 - 200 mg/dL    Triglycerides 121  <150 mg/dL    HDL 39 (*) >39 mg/dL    Total CHOL/HDL Ratio 4.1      VLDL 24  0 - 40 mg/dL    LDL Cholesterol 98  0 - 99 mg/dL   HEMOGLOBIN A1C     Status: Abnormal   Collection Time   07/03/12  5:05 AM      Component Value Range Comment   Hemoglobin A1C 6.4 (*) <5.7 %    Mean Plasma Glucose 137 (*) <117 mg/dL   GLUCOSE, CAPILLARY     Status: Abnormal   Collection Time   07/03/12  7:20 AM      Component Value Range Comment   Glucose-Capillary 101 (*) 70 - 99 mg/dL      Post Admission Physician Evaluation: 1. Functional deficits secondary  to Left lateral medullary infarct. 2. Patient is admitted to receive collaborative, interdisciplinary care between the physiatrist, rehab nursing staff, and therapy team. 3. Patient's level of medical complexity and substantial therapy needs in context of that medical necessity cannot be provided at a lesser intensity of care such as a SNF. 4. Patient has experienced substantial functional loss from his/her baseline which was documented above under the "Functional History" and "Functional Status" headings.  Judging by the patient's diagnosis, physical exam, and functional history, the patient has potential for functional progress which will result in measurable gains while on inpatient rehab.  These gains will be of substantial and practical use upon discharge  in facilitating mobility and self-care at the household level. 5. Physiatrist will provide 24 hour management of medical needs as well as oversight of the therapy plan/treatment and provide guidance as appropriate regarding the interaction of the two. 6. 24 hour rehab nursing will assist  with bladder management, bowel management, safety, skin/wound care, disease management, medication administration, pain management and patient education  and help integrate therapy concepts, techniques,education, etc. 7. PT will assess and treat for:  Pre-gait training gait training and endurance safety awareness and equipment.  Goals are: Supervision to modified independent mobility. 8. OT will assess and treat for: ADLs, cognitive perceptual skills, neuromuscular reeducation, safety endurance , equipment.   Goals are: Supervision to modified independent ADLs. 9. SLP will assess and treat for: Not applicable.  Goals are: Not applicable. 10. Case Management and Social Worker will assess and treat for psychological issues and discharge planning. 11. Team conference will be held weekly to assess progress toward goals and to determine barriers to discharge. 12. Patient will receive at least 3 hours of therapy per day at least 5 days per week. 13. ELOS: 10-12 days      Prognosis:  excellent   Medical Problem List and Plan: 1. DVT Prophylaxis/Anticoagulation: Pharmaceutical: Lovenox 2.  Pain Management:  N/A 3. Mood: good outlook and motivated. LCSW to follow for formal evaluation.  4. Neuropsych: This patient is capable of making decisions on his/her own behalf. 5. HTN:  Monitor with bid checks. On hydralazine q 8 hours with borderline control. Monitor for trends in next 24-48 hours. Will add ace if BP still labile.  6. Dyslipidemia: continue  zocor while in hospital. Will change to pravastatin at discharge.  7. IPG: Will educate on CM diet.   07/04/2012, 7:39 PM

## 2012-07-04 NOTE — Progress Notes (Signed)
Patient request to have all four side rails up. She stated " it makes me feel better." Roberts-VonCannon, Anai Lipson Selinda Eon

## 2012-07-04 NOTE — Progress Notes (Signed)
Occupational Therapy Treatment Patient Details Name: Catherine Aguilar MRN: [id number] DOB: [birth year] Today's Date: 07/04/2012 Time: [id number] OT Time Calculation (min): 11 min  OT Assessment / Plan / Recommendation Comments on Treatment Session Pt making progress this session with her balance and safety awareness during performance of ADL.    Follow Up Recommendations  Inpatient Rehab    Barriers to Discharge       Equipment Recommendations  Rolling walker with 5" wheels;3 in 1 bedside comode    Recommendations for Other Services    Frequency Min 2X/week   Plan Discharge plan remains appropriate    Precautions / Restrictions Precautions Precautions: Fall Restrictions Weight Bearing Restrictions: No        ADL  Grooming: Performed;Wash/dry hands;Min guard Where Assessed - Grooming: Unsupported standing Toilet Transfer: Performed;Minimal assistance;Other (comment) (min guard for sit to stand and sit with bar) Toilet Transfer Equipment: Comfort height toilet;Grab bars Toileting - Clothing Manipulation and Hygiene: Simulated;Min guard Where Assessed - Toileting Clothing Manipulation and Hygiene: Sit to stand from 3-in-1 or toilet Equipment Used: Rolling walker ADL Comments: Pt transferred to the comfort height commode with min assist especially with turning and backing up to the toilet for balance and safety but was able to stand and sit with grab bar use with min guard assist. Attempted to have pt try to sit and stand without grab bar but pt states she "isnt too sure of that yet" and preferred grab bar use. Pt with bettter safety using RW today--didnt move as quickly and was aware of her need to go slower. Pt did need min verbal cues for safe use with RW as she tends to let go of RW prematurely before reaching destination. Plan is for CIR. She reports some blurriness with vision today but states it is improved from yesterday. Able to read words on her dry erase board including the  smaller print.     OT Diagnosis:    OT Problem List:   OT Treatment Interventions:     OT Goals ADL Goals ADL Goal: Grooming - Progress: Progressing toward goals ADL Goal: Toilet Transfer - Progress: Progressing toward goals ADL Goal: Toileting - Clothing Manipulation - Progress: Progressing toward goals ADL Goal: Additional Goal #1 - Progress: Progressing toward goals  Visit Information  Last OT Received On: 07/04/12 Assistance Needed: +1    Subjective Data  Subjective: I already did my own bath and been to the [facility] Patient Stated Goal: be independent as possible   Prior Functioning       Cognition  Overall Cognitive Status: Impaired Area of Impairment: Safety/judgement Arousal/Alertness: Awake/alert Behavior During Session: [facility] for tasks performed Safety/Judgement: Decreased safety judgement for tasks assessed    Mobility  Shoulder Instructions Bed Mobility Bed Mobility: Supine to Sit Supine to Sit: 6: Modified independent (Device/Increase time);HOB elevated Sit to Supine: 6: Modified independent (Device/Increase time);HOB elevated Transfers Transfers: Sit to Stand;Stand to Sit Sit to Stand: 4: Min guard;From bed;From toilet;With upper extremity assist Stand to Sit: 4: Min guard;With upper extremity assist;To toilet;To bed Details for Transfer Assistance: verbal cues for hand placement and safety       Exercises      Balance Balance Balance Assessed: Yes Dynamic Standing Balance Dynamic Standing - Level of Assistance: 4: Min assist;Other (comment) (min guard assist)   End of Session OT - End of Session Equipment Utilized During Treatment: Gait belt Activity Tolerance: Patient tolerated treatment well Patient left: in bed;with call bell/phone within reach;with bed alarm set  GO     Jules Schick [id number] 07/04/2012, 12:00 PM

## 2012-07-04 NOTE — Progress Notes (Signed)
Nursing Note: Pt has literature at the bedside on stroke,risk factors ,warning signs ,modifiable risk factors and etilogy and affects of stroke as well as magazines form [employer] on coping w/ stroke. 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 Pt verbalizes that she regrets that she stopped taking her BP meds years ago and states that she would have never stopped if she had understood how important it was.Pt encouraged to read literature and ask questions if needed. Pt very receptive to information.wbb

## 2012-07-05 ENCOUNTER — Inpatient Hospital Stay (HOSPITAL_COMMUNITY): Payer: Medicaid Other | Admitting: Physical Therapy

## 2012-07-05 ENCOUNTER — Inpatient Hospital Stay (HOSPITAL_COMMUNITY): Payer: Medicaid Other | Admitting: *Deleted

## 2012-07-05 ENCOUNTER — Inpatient Hospital Stay (HOSPITAL_COMMUNITY): Payer: Medicaid Other | Admitting: Speech Pathology

## 2012-07-05 DIAGNOSIS — I6330 Cerebral infarction due to thrombosis of unspecified cerebral artery: Secondary | ICD-10-CM

## 2012-07-05 DIAGNOSIS — I69993 Ataxia following unspecified cerebrovascular disease: Secondary | ICD-10-CM

## 2012-07-05 LAB — GLUCOSE, CAPILLARY: Glucose-Capillary: 109 mg/dL — ABNORMAL HIGH (ref 70–99)

## 2012-07-05 MED ORDER — GABAPENTIN 600 MG PO TABS
300.0000 mg | ORAL_TABLET | Freq: Two times a day (BID) | ORAL | Status: DC
  Filled 2012-07-05 (×3): qty 0.5

## 2012-07-05 MED ORDER — GABAPENTIN 300 MG PO CAPS
300.0000 mg | ORAL_CAPSULE | Freq: Two times a day (BID) | ORAL | Status: DC
  Administered 2012-07-05 – 2012-07-07 (×5): 300 mg via ORAL
  Filled 2012-07-05 (×7): qty 1

## 2012-07-05 NOTE — Progress Notes (Signed)
Patient getting out of bed unassisted . Teaching has been done .   Patient still disregards teaching and lowers bed rails to take herself to the bathroom unassisted. She informs the person who arrives in her room to assist when the bed alarm goes off that, "she called for help and the person said "ok"."

## 2012-07-05 NOTE — Evaluation (Signed)
Occupational Therapy Assessment and Plan  Patient Details  Name: Catherine Aguilar MRN: [id number] Date of Birth: [birth year]  OT Diagnosis: ataxia, disturbance of vision and muscle weakness (generalized) Rehab Potential:  good ELOS:   7-9 days  Today's Date: 07/05/2012 Time: 0930-1030 Time Calculation (min): 60 min  Problem List:  Patient Active Problem List  Diagnosis  . Accelerated hypertension  . Left facial numbness  . CVA (cerebral vascular accident)    Past Medical History:  Past Medical History  Diagnosis Date  . Hypertension     stopped taking medications 10 years ago   Past Surgical History:  Past Surgical History  Procedure Date  . Abdominal hysterectomy 1985    Assessment & Plan Clinical Impression: HPI: Catherine Aguilar is a 56 y.o. female with history of untreated HTN (no meds X 10 years) who was admitted on 07/01/12 with funny sensation left face and unsteadiness. BP at admission 218/100. CT head negative. 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 2 D echo done revealing EF 60-65% and no wall abnormalities. Carotid dopplers without ICA stenosis. Neurology consulted and recommended full work up as well as MRA neck to rule out dissection and ASA for CVA prophylaxis. MRA head/neck done revealing hypoplastic posterior circulation, small right PICA and left PICA not visualized, full study limited due to patient's uncooperativeness. MBS done revealing mild dysphagia and patient started on D3, thin liquids. Therapy evaluations done and patient noted to be impulsive and have gait/ balance deficits. MD, therapy team recommending CIR   Patient transferred to CIR on 07/04/2012 .    Patient currently requires min with basic self-care skills and IADL secondary to muscle weakness.  Prior to hospitalization, patient could complete self care and home duties with no assist.  Patient will benefit from skilled intervention to increase level of independence with iADL prior to  discharge home with care partner.  Anticipate patient will require 24 hour supervision and follow up home health.  OT - End of Session Activity Tolerance: Tolerates 10 - 20 min activity with multiple rests Endurance Deficit: Yes Endurance Deficit Description:  (mild shortness of breath when walking) OT Assessment Rehab Potential: Good Barriers to Discharge: None OT Plan OT Frequency: 2-3 X/day, 60-90 minutes;5 out of 7 days Estimated Length of Stay: 7-9 days OT Treatment/Interventions: Balance/vestibular training;Discharge planning;DME/adaptive equipment instruction;Functional mobility training;Patient/family education;Self Care/advanced ADL retraining;Therapeutic Activities;Therapeutic Exercise;UE/LE Coordination activities;Visual/perceptual remediation/compensation OT Recommendation Equipment Recommended: Rolling walker with 5" wheels  OT Evaluation Precautions/Restrictions  Precautions Precautions: Fall Restrictions Weight Bearing Restrictions: No General   O2 Device: None (Room air) Pain Pain Assessment Pain Assessment: No/denies pain Home Living/Prior Functioning Home Living Lives With: Spouse Available Help at Discharge: Family Type of Home: House Home Access: Stairs to enter Technical brewer of Steps: 2 Entrance Stairs-Rails: None Home Layout: One level Bathroom Shower/Tub: Tub/shower unit;Door (regular shower head) Bathroom Toilet: Standard Bathroom Accessibility: Yes How Accessible: Accessible via walker Home Adaptive Equipment: None IADL History Homemaking Responsibilities: Yes Meal Prep Responsibility: Primary Laundry Responsibility: Primary Cleaning Responsibility: Primary Bill Paying/Finance Responsibility: Secondary (writes the money orders and husband pays bills) Shopping Responsibility: Primary Current License: No Mode of Transportation: Car;Friends;Bus Education:  (studying for business degree) Occupation: Ship broker Leisure and Hobbies:   (sewing, bowling, cooking) Prior Function Level of Independence: Independent with homemaking with ambulation;Independent with gait;Independent with transfers Able to Take Stairs?: Yes Driving: No Vocation: Student Leisure: Hobbies-yes (Comment) ADL   Vision/Perception  Vision - History Baseline Vision: Wears glasses only for reading Patient Visual Report:  Blurring of vision;Diplopia  Cognition Overall Cognitive Status: Other (comment) (pt seems impulsive, but question if this is baseline.  ) Arousal/Alertness: Awake/alert Orientation Level: Oriented X4 Behaviors: Restless (When not ambulating or directly engaged pt swings LEs or UEs) Safety/Judgment: Appears intact Comments: pt verbalizes need to call A, but question if pt will follow through.   Sensation Sensation Light Touch:  (tingling in the left side of face) Proprioception: Appears Intact Coordination Gross Motor Movements are Fluid and Coordinated: Yes Fine Motor Movements are Fluid and Coordinated: No Finger Nose Finger Test: 15x/20 F2N Left; 24x/20sec RUE Heel Shin Test: L LE with small delay compared to R.   Motor  Motor Motor: Hemiplegia Mobility  Bed Mobility Bed Mobility: Supine to Sit;Sitting - Scoot to Edge of Bed;Sit to Supine Supine to Sit: 6: Modified independent (Device/Increase time);With rails;HOB elevated Sitting - Scoot to Edge of Bed: 6: Modified independent (Device/Increase time) Sit to Supine: 6: Modified independent (Device/Increase time);HOB elevated Transfers Sit to Stand: 5: Supervision Sit to Stand Details: Verbal cues for precautions/safety Sit to Stand Details (indicate cue type and reason): Cues for UE use and to slow down Stand to Sit: 5: Supervision Stand to Sit Details (indicate cue type and reason): Verbal cues for precautions/safety Stand to Sit Details: Cues for UE use and to slow down  Trunk/Postural Assessment  Cervical Assessment Cervical Assessment: Within Functional  Limits Thoracic Assessment Thoracic Assessment: Within Functional Limits Lumbar Assessment Lumbar Assessment: Within Functional Limits Postural Control Postural Control: Within Functional Limits  Balance Balance Balance Assessed: Yes Berg Balance Test Sit to Stand: Needs minimal aid to stand or to stabilize Standing Unsupported: Able to stand 30 seconds unsupported Sitting with Back Unsupported but Feet Supported on Floor or Stool: Able to sit safely and securely 2 minutes Stand to Sit: Sits safely with minimal use of hands Static Standing Balance Static Standing - Balance Support: No upper extremity supported Static Standing - Level of Assistance: 5: Stand by assistance Static Standing - Comment/# of Minutes: pt able to stand while performing hand hygiene at sink without UE support and only S.   Extremity/Trunk Assessment  Pt. Leans to left during dynamic sitting/standing activities.  Able to correct LUE Assessment LUE Assessment: Exceptions to [facility] LUE Strength LUE Overall Strength: Deficits  See FIM for current functional status Refer to Care Plan for Long Term Goals  Recommendations for other services: None  Discharge Criteria: Patient will be discharged from OT if patient refuses treatment 3 consecutive times without medical reason, if treatment goals not met, if there is a change in medical status, if patient makes no progress towards goals or if patient is discharged from hospital.  The above assessment, treatment plan, treatment alternatives and goals were discussed and mutually agreed upon: by patient  Lisa Roca 07/05/2012, 6:07 PM

## 2012-07-05 NOTE — Progress Notes (Signed)
Nutrition Consult Note  - Consult for carbohydrate modified diet received. Attempted to educate pt on this diet, however pt stated "Before I came in here I was eating whenever and whatever I want. I am not interested in changing my eating habits. Thank you for your time". Education handouts placed at bedside which pt took. Pt denied any educational needs. Please re-consult RD if education appropriate at a later date.   Mikey College MS, [place], [place] After Hours Pager

## 2012-07-05 NOTE — Evaluation (Signed)
Physical Therapy Assessment and Plan  Patient Details  Name: Catherine Aguilar MRN: [id number] Date of Birth: [birth year]  PT Diagnosis: Difficulty walking and Hemiplegia non-dominant Rehab Potential: Good ELOS: 7-10 days   Today's Date: 07/05/2012 Time: 0800-0900 Time Calculation (min): 60 min  Problem List:  Patient Active Problem List  Diagnosis  . Accelerated hypertension  . Left facial numbness  . CVA (cerebral vascular accident)    Past Medical History:  Past Medical History  Diagnosis Date  . Hypertension     stopped taking medications 10 years ago   Past Surgical History:  Past Surgical History  Procedure Date  . Abdominal hysterectomy 1985    Assessment & Plan Clinical Impression:Jasmon Probert is a 56 y.o. female with history of untreated HTN (no meds X 10 years) who was admitted on 07/01/12 with funny sensation left face and unsteadiness. BP at admission 218/100. CT head negative. 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 2 D echo done revealing EF 60-65% and no wall abnormalities. Carotid dopplers without ICA stenosis. Neurology consulted and recommended full work up as well as MRA neck to rule out dissection and ASA for CVA prophylaxis. MRA head/neck done revealing hypoplastic posterior circulation, small right PICA and left PICA not visualized, full study limited due to patient's uncooperativeness. MBS done revealing mild dysphagia and patient started on D3, thin liquids. Therapy evaluations done and patient noted to be impulsive and have gait/ balance deficits. MD, therapy team recommending CIR Patient transferred to CIR on 07/04/2012 .   Patient currently requires supervision with mobility secondary to impaired timing and sequencing and decreased coordination.  Prior to hospitalization, patient was Independent with mobility and lived with Spouse in a House home.  Home access is 2Stairs to enter.  Patient will benefit from skilled PT intervention to  maximize safe functional mobility for planned discharge home with 24 hour supervision.  Anticipate patient will benefit from follow up [place] at discharge.  PT - End of Session Activity Tolerance: Endurance does not limit participation in activity PT Assessment Rehab Potential: Good PT Plan PT Frequency: 2-3 X/day, 60-90 minutes Estimated Length of Stay: 7-10 days PT Treatment/Interventions: Ambulation/gait training;Balance/vestibular training;DME/adaptive equipment instruction;Functional mobility training;Neuromuscular re-education;Patient/family education;Stair training;Therapeutic Activities;Therapeutic Exercise;UE/LE Strength taining/ROM;UE/LE Coordination activities PT Recommendation Follow Up Recommendations: Home health PT Equipment Recommended: Rolling walker with 5" wheels  PT Evaluation Precautions/Restrictions Precautions Precautions: Fall General @[email or web address] [phone]) Vital Signs   Pain Pain Assessment Pain Assessment: No/denies pain Home Living/Prior Functioning Home Living Lives With: Spouse Available Help at Discharge: Family;Available 24 hours/day Type of Home: House Home Access: Stairs to enter [employer] of Steps: 2 Entrance Stairs-Rails: None Home Layout: One level Bathroom Shower/Tub: Chiropodist: Standard Bathroom Accessibility: Yes How Accessible: Accessible via walker Home Adaptive Equipment: None Prior Function Level of Independence: Independent with homemaking with ambulation;Independent with gait;Independent with transfers Able to Take Stairs?: Yes Driving: Yes Vocation: Student Vision/Perception     Cognition Overall Cognitive Status: Other (comment) (pt seems impulsive, but question if this is baseline.  ) Arousal/Alertness: Awake/alert Orientation Level: Oriented X4 Behaviors: Restless (When not ambulating or directly engaged pt swings LEs or UEs) Safety/Judgment: Appears intact Comments: pt verbalizes  need to call A, but question if pt will follow through.   Sensation Sensation Light Touch: Appears Intact Proprioception: Appears Intact Coordination Gross Motor Movements are Fluid and Coordinated: Yes Fine Motor Movements are Fluid and Coordinated: No Heel Shin Test: L LE with small delay compared to R.   Motor  Motor Motor: Hemiplegia  Mobility Bed Mobility Bed Mobility: Supine to Sit;Sitting - Scoot to Edge of Bed;Sit to Supine Supine to Sit: 6: Modified independent (Device/Increase time);With rails;HOB elevated Sitting - Scoot to Edge of Bed: 6: Modified independent (Device/Increase time) Sit to Supine: 6: Modified independent (Device/Increase time);HOB elevated Transfers Sit to Stand: 5: Supervision Sit to Stand Details: Verbal cues for precautions/safety Sit to Stand Details (indicate cue type and reason): Cues for UE use and to slow down Stand to Sit: 5: Supervision Stand to Sit Details (indicate cue type and reason): Verbal cues for precautions/safety Stand to Sit Details: Cues for UE use and to slow down Locomotion  Ambulation Ambulation: Yes Ambulation/Gait Assistance: 5: Supervision;4: Min guard Ambulation Distance (Feet): 300 Feet Assistive device: Rolling walker Ambulation/Gait Assistance Details: pt S with use of RW and only cueing for use of RW and safety with flooring changes.  pt MinGuard ambulating without RW 100' in controlled environment.  pt L LE with decreased hip/knee flexion and dorsiflexion.   Stairs / Additional Locomotion Stairs: Yes Stairs Assistance: 4: Min assist Stairs Assistance Details (indicate cue type and reason): cues to slow down Stair Management Technique: One rail Right;Forwards Number of Stairs: 10  Wheelchair Mobility Wheelchair Mobility: No  Trunk/Postural Assessment  Cervical Assessment Cervical Assessment: Within Functional Limits Thoracic Assessment Thoracic Assessment: Within Functional Limits Lumbar Assessment Lumbar  Assessment: Within Functional Limits Postural Control Postural Control: Within Functional Limits  Balance Balance Balance Assessed: Yes Static Standing Balance Static Standing - Balance Support: No upper extremity supported Static Standing - Level of Assistance: 5: Stand by assistance Static Standing - Comment/# of Minutes: pt able to stand while performing hand hygiene at sink without UE support and only S.   Extremity Assessment      RLE Assessment RLE Assessment: Within Functional Limits LLE Assessment LLE Assessment: Exceptions to [facility] LLE Strength LLE Overall Strength Comments: pt's AROM WFL, Strength in Ankle/foot grossly 3+/5, knee/hip grossly 4/5  See FIM for current functional status Refer to Care Plan for Long Term Goals  Recommendations for other services: None  Discharge Criteria: Patient will be discharged from PT if patient refuses treatment 3 consecutive times without medical reason, if treatment goals not met, if there is a change in medical status, if patient makes no progress towards goals or if patient is discharged from hospital.  The above assessment, treatment plan, treatment alternatives and goals were discussed and mutually agreed upon: by patient  Peggy Loge, Thornton Papas 07/05/2012, 10:20 AM

## 2012-07-05 NOTE — Evaluation (Signed)
Speech Language Pathology Assessment and Plan  Patient Details  Name: Catherine Aguilar MRN: [id number] Date of Birth: July 27, 1956  SLP Diagnosis:  n/a Rehab Potential:  (defer to PT/OT) ELOS: defer to PT/OT   Today's Date: 07/05/2012 Time: 1330-1400 Time Calculation (min): 30 min  Problem List:  Patient Active Problem List  Diagnosis  . Accelerated hypertension  . Left facial numbness  . CVA (cerebral vascular accident)   Past Medical History:  Past Medical History  Diagnosis Date  . Hypertension     stopped taking medications 10 years ago   Past Surgical History:  Past Surgical History  Procedure Date  . Abdominal hysterectomy 1985    Assessment / Plan / Recommendation Clinical Impression  Catherine Aguilar is a 56 y.o. female with history of untreated HTN (no meds X 10 years) who was admitted to [facility] on 07/01/12 with funny sensation left face and unsteadiness. BP at admission 218/100. CT head negative. 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 MBS done revealing mild dysphagia and patient started on D3, thin liquids. Therapy evaluations done and patient noted to be impulsive and have gait/ balance deficits. Patient admitted to [place] 07/04/12 and upon evaluation today presents with [facility] oral motor function with no overt s/s of aspiration during consumption of regular textures and thin liquids.  Patient and orders reflect that she has been receiving and consuming regular textures; as a result SLP recommends continuation of regular textures and thin liquids.  Cognitive-linguistic evaluation reveals WFL receptive and expressive abilities with presentation of questionable "impulsive" behavior that patient report's is her personality.  Given location of CVA and report of personality being a fast talker and walk PTA, SLP recommends no follow up services at this time.      SLP Assessment  Patient does not need any further Speech Lanaguage  Pathology Services    Recommendations  Follow up Recommendations: None Equipment Recommended: None recommended by SLP    SLP Frequency  n/a  SLP Treatment/Interventions  n/a   Pain Pain Assessment Pain Assessment: No/denies pain Prior Functioning Cognitive/Linguistic Baseline: Within functional limits (pt. reports "impulsivity" is baseline personality)  See FIM for current functional status  Recommendations for other services: None  Discharge Criteria: Patient will be discharged from SLP if patient refuses treatment 3 consecutive times without medical reason, if treatment goals not met, if there is a change in medical status, if patient makes no progress towards goals or if patient is discharged from hospital.  The above assessment, treatment plan, treatment alternatives and goals were discussed and mutually agreed upon: by patient  Gunnar Fusi, M.A., CCC-SLP [phone]  [place] 07/05/2012, 3:54 PM

## 2012-07-05 NOTE — Progress Notes (Signed)
Patient ID: Catherine Aguilar, female   DOB: [birth year], 56 y.o.   MRN: [id number]  Subjective/Complaints: Slept well No c/os Review of Systems  Neurological: Positive for tingling.  All other systems reviewed and are negative.    Objective: Vital Signs: Blood pressure 151/89, pulse 69, temperature 98 F (36.7 C), temperature source Oral, resp. rate 18, weight 76.2 kg (167 lb 15.9 oz), SpO2 99.00%. No results found. Results for orders placed during the hospital encounter of 07/04/12 (from the past 72 hour(s))  GLUCOSE, CAPILLARY     Status: Abnormal   Collection Time   07/05/12  7:27 AM      Component Value Range Comment   Glucose-Capillary 109 (*) 70 - 99 mg/dL      HEENT: normal Cardio: RRR Resp: CTA B/L GI: BS positive Extremity:  No Edema Skin:   Intact Neuro: Alert/Oriented, Cranial Nerve Abnormalities CN V, Abnormal Sensory reduced L side face, Abnormal FMC Ataxic/ dec FMC and Other Abnormal L Finger nose finger Musc/Skel:  Normal Gen NAD   Assessment/Plan: 1. 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 Physiatrist is providing close team supervision and 24 hour management of active medical problems listed below. Physiatrist and rehab team continue to assess barriers to discharge/monitor patient progress toward functional and medical goals. FIM:             FIM - Control and instrumentation engineer Devices: Copy: 5: Supine > Sit: Supervision (verbal cues/safety issues);5: Bed > Chair or W/C: Supervision (verbal cues/safety issues)  FIM - Locomotion: Wheelchair Locomotion: Wheelchair: 0: Activity did not occur FIM - Locomotion: Ambulation Locomotion: Ambulation Assistive Devices: Administrator Ambulation/Gait Assistance: 5: Supervision Locomotion: Ambulation: 5: Travels 150 ft or more with  supervision/safety issues  Comprehension Comprehension Mode: Auditory Comprehension: 5-Understands complex 90% of the time/Cues < 10% of the time  Expression Expression Mode: Verbal Expression: 7-Expresses complex ideas: With no assist  Social Interaction Social Interaction: 6-Interacts appropriately with others with medication or extra time (anti-anxiety, antidepressant).  Problem Solving Problem Solving: 6-Solves complex problems: With extra time  Memory Memory: 7-Complete Independence: No helper  . DVT Prophylaxis/Anticoagulation: Pharmaceutical: Lovenox  2. Pain Management: N/A  3. Mood: good outlook and motivated. LCSW to follow for formal evaluation.  4. Neuropsych: This patient is capable of making decisions on his/her own behalf.  5. HTN: Monitor with bid checks. On hydralazine q 8 hours with borderline control. Monitor for trends in next 24-48 hours. Will add ace if BP still labile.  6. Dyslipidemia: continue zocor while in hospital. Will change to pravastatin at discharge.  7. IPG: Will educate on CM diet.    LOS (Days) 1 A FACE TO FACE EVALUATION WAS PERFORMED  KIRSTEINS,ANDREW E 07/05/2012, 10:54 AM

## 2012-07-06 ENCOUNTER — Inpatient Hospital Stay (HOSPITAL_COMMUNITY): Payer: Medicaid Other | Admitting: Physical Therapy

## 2012-07-06 LAB — GLUCOSE, CAPILLARY: Glucose-Capillary: 103 mg/dL — ABNORMAL HIGH (ref 70–99)

## 2012-07-06 MED ORDER — RAMIPRIL 2.5 MG PO CAPS
2.5000 mg | ORAL_CAPSULE | Freq: Every day | ORAL | Status: DC
  Administered 2012-07-06 – 2012-07-07 (×2): 2.5 mg via ORAL
  Filled 2012-07-06 (×3): qty 1

## 2012-07-06 NOTE — Progress Notes (Signed)
Physical Therapy Session Note  Patient Details  Name: Catherine Aguilar MRN: [id number] Date of Birth: 1956-03-01  Today's Date: 07/06/2012 Time: [id number] Time Calculation (min): 44 min  Short Term Goals: Week 1:    Skilled Therapeutic Interventions/Progress Updates:   OOB with supervision, initially gait with RW x 100' with min@, progressing to not using RW, while unmaking and making the bed, following by looking for items in the cabinets in the kitchen with min@.  Wii Dance for dynamic balance training with min@, pt developing her own dance moves and SOB at end of song needing a rest break.  After 2 rounds of the Wii, pt requested to do something different.  Performed foam balance beam exercises using theraband to improve core strength and balance reactions.  Gait on unit without RW x 100', note pt with increased BOS.  Therapy Documentation Precautions:  Precautions Precautions: Fall Restrictions Weight Bearing Restrictions: No Pain:  No pain reported See FIM for current functional status  Therapy/Group: Individual Therapy  Waylan Boga 07/06/2012, 3:20 PM

## 2012-07-06 NOTE — Progress Notes (Signed)
Patient ID: Catherine Aguilar, female   DOB: 02/28/1956, 56 y.o.   MRN: [id number]  Subjective/Complaints: Slept well L side of face is still numb and tingling.  No Side effects noted from gabapentin Review of Systems  Neurological: Positive for tingling.  All other systems reviewed and are negative.    Objective: Vital Signs: Blood pressure 158/93, pulse 70, temperature 98.2 F (36.8 C), temperature source Oral, resp. rate 20, weight 76.2 kg (167 lb 15.9 oz), SpO2 100.00%. No results found. Results for orders placed during the hospital encounter of 07/04/12 (from the past 72 hour(s))  GLUCOSE, CAPILLARY     Status: Abnormal   Collection Time   07/05/12  7:27 AM      Component Value Range Comment   Glucose-Capillary 109 (*) 70 - 99 mg/dL   GLUCOSE, CAPILLARY     Status: Abnormal   Collection Time   07/06/12  7:36 AM      Component Value Range Comment   Glucose-Capillary 103 (*) 70 - 99 mg/dL      HEENT: normal Cardio: RRR Resp: CTA B/L GI: BS positive Extremity:  No Edema Skin:   Intact Neuro: Alert/Oriented, Cranial Nerve Abnormalities CN V, Abnormal Sensory reduced L side face, Abnormal FMC Ataxic/ dec FMC and Other Abnormal L Finger nose finger Musc/Skel:  Normal Gen NAD   Assessment/Plan: 1. 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 Physiatrist is providing close team supervision and 24 hour management of active medical problems listed below. Physiatrist and rehab team continue to assess barriers to discharge/monitor patient progress toward functional and medical goals. FIM:       FIM - Toileting Toileting steps completed by patient: Adjust clothing prior to toileting;Performs perineal hygiene;Adjust clothing after toileting (wearing hospital gowns.) Toileting Assistive Devices: Grab bar or rail for support  FIM - Sport and exercise psychologist Devices: Grab bars;Walker Toilet Transfers: 4-To toilet/BSC: Min A (steadying Pt. > 75%)  FIM - Bed/Chair Transfer Bed/Chair Transfer Assistive Devices: Copy: 5: Supine > Sit: Supervision (verbal cues/safety issues);5: Bed > Chair or W/C: Supervision (verbal cues/safety issues)  FIM - Locomotion: Wheelchair Locomotion: Wheelchair: 0: Activity did not occur FIM - Locomotion: Ambulation Locomotion: Ambulation Assistive Devices: Administrator Ambulation/Gait Assistance: 5: Supervision Locomotion: Ambulation: 5: Travels 150 ft or more with supervision/safety issues  Comprehension Comprehension Mode: Auditory Comprehension: 6-Follows complex conversation/direction: With extra time/assistive device  Expression Expression Mode: Verbal Expression: 7-Expresses complex ideas: With no assist  Social Interaction Social Interaction: 6-Interacts appropriately with others with medication or extra time (anti-anxiety, antidepressant).  Problem Solving Problem Solving: 5-Solves complex 90% of the time/cues < 10% of the time  Memory Memory: 6-Assistive device: No helper  . DVT Prophylaxis/Anticoagulation: Pharmaceutical: Lovenox  2. Pain Management: N/A  3. Mood: good outlook and motivated. LCSW to follow for formal evaluation.  4. Neuropsych: This patient is capable of making decisions on his/her own behalf.  5. HTN: Monitor with bid checks. On hydralazine q 8 hours with borderline control. Monitor for trends in next 24-48 hours. Will add ace to improve control  6. Dyslipidemia: continue zocor while in hospital. Will change to pravastatin at discharge.  7. IPG: Will educate on CM diet.    LOS (Days) 2 A FACE TO FACE EVALUATION WAS PERFORMED  Joquan Lotz E 07/06/2012, 9:58 AM

## 2012-07-07 ENCOUNTER — Encounter (HOSPITAL_COMMUNITY): Payer: 59 | Admitting: Occupational Therapy

## 2012-07-07 ENCOUNTER — Inpatient Hospital Stay (HOSPITAL_COMMUNITY): Payer: Medicaid Other

## 2012-07-07 ENCOUNTER — Inpatient Hospital Stay (HOSPITAL_COMMUNITY): Payer: 59 | Admitting: Speech Pathology

## 2012-07-07 ENCOUNTER — Inpatient Hospital Stay (HOSPITAL_COMMUNITY): Payer: 59 | Admitting: Physical Therapy

## 2012-07-07 DIAGNOSIS — I6330 Cerebral infarction due to thrombosis of unspecified cerebral artery: Secondary | ICD-10-CM

## 2012-07-07 DIAGNOSIS — I69993 Ataxia following unspecified cerebrovascular disease: Secondary | ICD-10-CM

## 2012-07-07 LAB — COMPREHENSIVE METABOLIC PANEL
ALT: 26 U/L (ref 0–35)
Alkaline Phosphatase: 63 U/L (ref 39–117)
GFR calc Af Amer: 85 mL/min — ABNORMAL LOW (ref >90)
Glucose, Bld: 98 mg/dL (ref 70–99)
Potassium: 4 mEq/L (ref 3.5–5.1)
Sodium: 139 mEq/L (ref 135–145)
Total Protein: 6.8 g/dL (ref 6.0–8.3)

## 2012-07-07 LAB — GLUCOSE, CAPILLARY

## 2012-07-07 LAB — CBC WITH DIFFERENTIAL/PLATELET
Eosinophils Absolute: 0.1 10*3/uL (ref 0.0–0.7)
Lymphocytes Relative: 44 % (ref 12–46)
Lymphs Abs: 3.6 10*3/uL (ref 0.7–4.0)
MCH: 27.3 pg (ref 26.0–34.0)
Neutro Abs: 3.7 10*3/uL (ref 1.7–7.7)
Neutrophils Relative %: 45 % (ref 43–77)
Platelets: 354 10*3/uL (ref 150–400)
RBC: 4.95 MIL/uL (ref 3.87–5.11)
WBC: 8.2 10*3/uL (ref 4.0–10.5)

## 2012-07-07 MED ORDER — HYDRALAZINE HCL 50 MG PO TABS: 50.0000 mg | ORAL_TABLET | Freq: Three times a day (TID) | ORAL | Status: DC

## 2012-07-07 MED ORDER — RAMIPRIL 2.5 MG PO CAPS: 2.5000 mg | ORAL_CAPSULE | Freq: Every day | ORAL | Status: DC

## 2012-07-07 MED ORDER — FLUTICASONE PROPIONATE 50 MCG/ACT NA SUSP: 1.0000 | Freq: Every day | NASAL | Status: DC | PRN

## 2012-07-07 MED ORDER — PRAVASTATIN SODIUM 40 MG PO TABS: 40.0000 mg | ORAL_TABLET | Freq: Every day | ORAL | Status: DC

## 2012-07-07 MED ORDER — GABAPENTIN 300 MG PO CAPS: 300.0000 mg | ORAL_CAPSULE | Freq: Two times a day (BID) | ORAL | Status: DC

## 2012-07-07 MED ORDER — ASPIRIN 325 MG PO TABS: 325.0000 mg | ORAL_TABLET | Freq: Every day | ORAL | Status: DC

## 2012-07-07 NOTE — Progress Notes (Signed)
Occupational Therapy Note  Patient Details  Name: Catara Stache MRN: [id number] Date of Birth: Mar 23, 1956 Today's Date: 07/07/2012  Time: 1100-1155 Pt c/o 10/10 headache: RN aware and admin meds at beginning of session Individual Therapy Pt engaged in home mgmt tasks, watering plants, preparing Ramen noodles and cleaning up kitchen, cleaning her room, and carrying items while amb without AD.  Pt did not exhibit any unsafe behavior and was independent with all tasks.  Focus on activity tolerance, safety awareness, and dynamic standing balance.   Leotis [facility] 07/07/2012, 12:00 PM

## 2012-07-07 NOTE — Progress Notes (Signed)
Patient ID: Catherine Aguilar, female   DOB: March 18, 1956, 56 y.o.   MRN: [id number]  Subjective/Complaints: Slept well.  VERY ANXIOUS TO GO HOME. "I'M READY"  L side of face is still numb and tingling.  No Side effects noted from gabapentin Review of Systems  Neurological: Positive for tingling.  All other systems reviewed and are negative.    Objective: Vital Signs: Blood pressure 137/87, pulse 97, temperature 97.9 F (36.6 C), temperature source Oral, resp. rate 18, weight 76.2 kg (167 lb 15.9 oz), SpO2 100.00%. No results found. Results for orders placed during the hospital encounter of 07/04/12 (from the past 72 hour(s))  GLUCOSE, CAPILLARY     Status: Abnormal   Collection Time   07/05/12  7:27 AM      Component Value Range Comment   Glucose-Capillary 109 (*) 70 - 99 mg/dL   GLUCOSE, CAPILLARY     Status: Abnormal   Collection Time   07/06/12  7:36 AM      Component Value Range Comment   Glucose-Capillary 103 (*) 70 - 99 mg/dL   CBC WITH DIFFERENTIAL     Status: Normal   Collection Time   07/07/12  4:53 AM      Component Value Range Comment   WBC 8.2  4.0 - 10.5 K/uL    RBC 4.95  3.87 - 5.11 MIL/uL    Hemoglobin 13.5  12.0 - 15.0 g/dL    HCT 42.1  36.0 - 46.0 %    MCV 85.1  78.0 - 100.0 fL    MCH 27.3  26.0 - 34.0 pg    MCHC 32.1  30.0 - 36.0 g/dL    RDW 14.6  11.5 - 15.5 %    Platelets 354  150 - 400 K/uL    Neutrophils Relative 45  43 - 77 %    Neutro Abs 3.7  1.7 - 7.7 K/uL    Lymphocytes Relative 44  12 - 46 %    Lymphs Abs 3.6  0.7 - 4.0 K/uL    Monocytes Relative 9  3 - 12 %    Monocytes Absolute 0.8  0.1 - 1.0 K/uL    Eosinophils Relative 2  0 - 5 %    Eosinophils Absolute 0.1  0.0 - 0.7 K/uL    Basophils Relative 1  0 - 1 %    Basophils Absolute 0.1  0.0 - 0.1 K/uL   COMPREHENSIVE METABOLIC PANEL     Status: Abnormal   Collection Time   07/07/12  4:53 AM      Component Value Range Comment   Sodium 139  135 - 145 mEq/L    Potassium 4.0  3.5 - 5.1 mEq/L      Chloride 105  96 - 112 mEq/L    CO2 24  19 - 32 mEq/L    Glucose, Bld 98  70 - 99 mg/dL    BUN 15  6 - 23 mg/dL    Creatinine, Ser 0.86  0.50 - 1.10 mg/dL    Calcium 10.2  8.4 - 10.5 mg/dL    Total Protein 6.8  6.0 - 8.3 g/dL    Albumin 3.4 (*) 3.5 - 5.2 g/dL    AST 22  0 - 37 U/L    ALT 26  0 - 35 U/L    Alkaline Phosphatase 63  39 - 117 U/L    Total Bilirubin 0.2 (*) 0.3 - 1.2 mg/dL    GFR calc non Af Amer 74 (*) >90  mL/min    GFR calc Af Amer 85 (*) >90 mL/min   GLUCOSE, CAPILLARY     Status: Abnormal   Collection Time   07/07/12  7:13 AM      Component Value Range Comment   Glucose-Capillary 103 (*) 70 - 99 mg/dL    Comment 1 Notify RN        HEENT: normal Cardio: RRR Resp: CTA B/L GI: BS positive Extremity:  No Edema Skin:   Intact Neuro: Alert/Oriented, Cranial Nerve Abnormalities CN V, Abnormal Sensory reduced L side face, Abnormal FMC Ataxic/ dec FMC and Other Abnormal L Finger nose finger Musc/Skel:  Normal Gen NAD Mild diplopia, left facial sensory loss to PP and LT. Mild left limb ataxia.  Assessment/Plan: 1. 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 Physiatrist is providing close team supervision and 24 hour management of active medical problems listed below. Physiatrist and rehab team continue to assess barriers to discharge/monitor patient progress toward functional and medical goals.  Pt seems to be impulsive, and is extremely anxious to go home. Husband is there. It appears that she is at a supervision level largely at this time (to min assist). Will discuss with team regarding further needs here, discharge.  FIM:    FIM - Upper Body Dressing/Undressing Upper body dressing/undressing steps patient completed:  (refused our bath)  FIM - Toileting Toileting steps completed by patient: Adjust clothing prior to toileting;Performs  perineal hygiene;Adjust clothing after toileting (wearing hospital gowns.) Toileting Assistive Devices: Grab bar or rail for support Toileting: 5: Supervision: Safety issues/verbal cues  FIM - Radio producer Devices: Grab bars;Walker Toilet Transfers: 4-To toilet/BSC: Min A (steadying Pt. > 75%)  FIM - Bed/Chair Transfer Bed/Chair Transfer Assistive Devices: Copy: 5: Supine > Sit: Supervision (verbal cues/safety issues);5: Bed > Chair or W/C: Supervision (verbal cues/safety issues)  FIM - Locomotion: Wheelchair Locomotion: Wheelchair: 0: Activity did not occur FIM - Locomotion: Ambulation Locomotion: Ambulation Assistive Devices: Walker - Rolling;Other (comment) (no device) Ambulation/Gait Assistance: 4: Min assist Locomotion: Ambulation: 2: Travels 50 - 149 ft with minimal assistance (Pt.>75%)  Comprehension Comprehension Mode: Auditory Comprehension: 6-Follows complex conversation/direction: With extra time/assistive device  Expression Expression Mode: Verbal Expression: 7-Expresses complex ideas: With no assist  Social Interaction Social Interaction: 6-Interacts appropriately with others with medication or extra time (anti-anxiety, antidepressant).  Problem Solving Problem Solving: 5-Solves complex 90% of the time/cues < 10% of the time  Memory Memory: 6-Assistive device: No helper  . DVT Prophylaxis/Anticoagulation: Pharmaceutical: Lovenox  2. Pain Management: N/A  3. Mood: good outlook and motivated. LCSW to follow for formal evaluation.  4. Neuropsych: This patient is capable of making decisions on his/her own behalf.  5. HTN: Monitor with bid checks. On hydralazine q 8 hours with borderline control. Monitor for trends in next 24-48 hours. Will add ace to improve control  6. Dyslipidemia: continue zocor while in hospital. Will change to pravastatin at discharge.  7. IPG: Will educate on CM diet.    LOS (Days) 3 A  FACE TO FACE EVALUATION WAS PERFORMED  SWARTZ,ZACHARY T 07/07/2012, 8:48 AM

## 2012-07-07 NOTE — Progress Notes (Signed)
Social Work  Discharge Note  The overall goal for the admission was met for:   Discharge location: Yes - home with husband  Length of Stay: Yes - 3 days  Discharge activity level: Yes - supervision to modified independent  Home/community participation: Yes  Services provided included: MD, RD, PT, OT, RN, Pharmacy and SW  Financial Services: Other: self-pay  Follow-up services arranged: NA - no follow up therapies recommended  Comments (or additional information): Provided pt with 3 day supply of d/c meds (husband "gets his check" on Friday and pt believes she can pay for meds at that point.  Also provided pt with list of free/ reduced medical clinics in area.  Patient/Family verbalized understanding of follow-up arrangements: Yes  Individual responsible for coordination of the follow-up plan: patient  Confirmed correct DME delivered: only needed tub seat - received via [place] prior to d/c  Catherine Aguilar

## 2012-07-07 NOTE — Plan of Care (Signed)
Problem: RH SAFETY Goal: RH STG ADHERE TO SAFETY PRECAUTIONS W/ASSISTANCE/DEVICE STG Adhere to Safety Precautions With min Assistance/Device.  Outcome: Completed/Met Date Met:  07/07/12 Patient Modified independent in the room.

## 2012-07-07 NOTE — Progress Notes (Signed)
Occupational Therapy Discharge Summary  Patient Details  Name: Catherine Aguilar MRN: [id number] Date of Birth: [birth year]  Today's Date: 07/07/2012 Time: [phone] Time Calculation (min): 55 min  Skilled Intervention:  Self care retraining to include shower, dress, groom, tub/shower transfers without use of grab bar. Focus session on safety with all mobility during BADL & IADL tasks as patient consistently made safe decisions as to when she needed to stabilize (wall or furniture walking PRN).  Patient requested eye patch to wear occasionally when diplopia occurs. Patient able to verbalize that dizziness is caused by visual impairment from the stroke.  Patient very eager to go home today and was mad Mod I in her room this AM.  Patient has met 10 of 10 long term goals due to improved balance, ability to compensate for deficits, functional use of  LEFT upper extremity, improved awareness and improved coordination.  Patient to discharge at overall Modified Independent level.  Patient is Mod I and is able to guide a caregiver if assistance is needed.  Reasons goals not met: n/a secondary to all goals met  Recommendation:  Patient will benefit from ongoing skilled OT services in no followup OT recommended at this time.   Equipment: shower chair with back  Reasons for discharge: treatment goals met and discharge from hospital  Patient/family agrees with progress made and goals achieved: Yes  OT Discharge Precautions/Restrictions  Precautions Precautions: Fall Pain Denies pain Vision/Perception  Vision - History Baseline Vision: Wears glasses only for reading Patient Visual Report: Diplopia;Blurring of vision Vision - Assessment Additional Comments: Patient does not have diplopia when head and eyes looking straight at item.  Diplopia occurs in left and right visual fields and more superior than inferior fields  Cognition Orientation Level: Oriented X4 Sensation Sensation Light Touch:   (tingling in the left side of face) Proprioception: Appears Intact Coordination Gross Motor Movements are Fluid and Coordinated: Yes Fine Motor Movements are Fluid and Coordinated: No Finger Nose Finger Test: minimal deficits noted during BADL tasks Motor  Motor Motor: Hemiplegia Motor - Discharge Observations: Per patient, "much inproved" Mobility  Bed Mobility Supine to Sit: 7: Independent Sitting - Scoot to Edge of Bed: 7: Independent Sit to Supine: 7: Independent Transfers Sit to Stand: 6: Modified independent (Device/Increase time) Stand to Sit: 6: Modified independent (Device/Increase time)  Extremity/Trunk Assessment RUE Assessment RUE Assessment: Within Functional Limits LUE Assessment LUE Assessment: Within Functional Limits (not formally assessed, WFL during BADL tasks)  See FIM for current functional status  Catherine Aguilar 07/07/2012, 8:43 PM

## 2012-07-07 NOTE — Progress Notes (Signed)
Note patient getting out of the bed with "undsteady gait," when offer to help patient to the bathroom, patient got angry.  Patient said,"I have been getting in and out of the bed on my own, I don't need anybody to help me.  God help those who help themselves, but I am doing everything for myself, so I can get out of this place."   Educated patient on fall prevention, will continue to monitor.

## 2012-07-07 NOTE — Progress Notes (Signed)
Patient impulsive with decreased safety awareness.  Declines bedalarm, reports alarm goes off too much. She is able to raise and lower SR's when she wants to get OOB or when getting back into bed . Reminded patient to call for assistance. Catherine Aguilar

## 2012-07-07 NOTE — Progress Notes (Signed)
Physical Therapy Discharge Summary  Patient Details  Name: Catherine Aguilar MRN: [id number] Date of Birth: 1955/10/31  Today's Date: 07/07/2012 Time: 1400-1443 Time Calculation (min): 43 min  Car transfer, room/ home environment negotiation performed with modified independence. Simulated community environment (as pt did not have shoes to go outside), with ambulation over uneven compliant surface, stepping over obstacle. Practiced gait with quad cane per pt request, she does not prefer cane as it slows her down and she will not be able to afford it. Education on visual targeting for compensation with ambulation, pt reports improved stability and decreased dizziness. Pt independent with strategy throughout rest of session. 12 steps with no railing with supervision cues for safe speed and sequencing. Practiced gaze stabilization in horizontal and vertical plane, cues for speed and technique for optimizing outcomes. Pt verbalized understanding, has no further questions or concerns about going home.  Patient has met 8 of 8 long term goals due to improved activity tolerance, improved balance, increased strength, ability to compensate for deficits and improved awareness.  Patient to discharge at an ambulatory level Modified Independent.   Patient's care partner  is reported  to provide the necessary physical assistance at discharge per pt.  Reasons goals not met: NA  Recommendation:  Patient will not benefit from ongoing skilled PT services as she reports she will not be willing to commit to therapy programs as she feel she has nearly fully recovered.   Equipment: No equipment provided  Reasons for discharge: treatment goals met and discharge from hospital  Patient/family agrees with progress made and goals achieved: Yes  PT Discharge Pain Pain Assessment Pain Assessment: No/denies pain V Cognition Orientation Level: Oriented X4 Safety/Judgment: Impaired Comments: Impulsive - appears to be  baseline Sensation Sensation Light Touch:  (tingling in the left side of face) Proprioception: Appears Intact Coordination Gross Motor Movements are Fluid and Coordinated: Yes Fine Motor Movements are Fluid and Coordinated: No  Mobility Bed Mobility Supine to Sit: 7: Independent Sitting - Scoot to Edge of Bed: 7: Independent Sit to Supine: 7: Independent Transfers Sit to Stand: 6: Modified independent (Device/Increase time) Stand to Sit: 6: Modified independent (Device/Increase time) Locomotion  Ambulation Ambulation: Yes Ambulation/Gait Assistance: 6: Modified independent (Device/Increase time);5: Supervision Ambulation Distance (Feet): 200 Feet Ambulation/Gait Assistance Details: Modified independent in controlled environments, supervision in simulated community environments and highly distracting environments.  High Level Ambulation High Level Ambulation: Side stepping;Backwards walking;Direction changes;Sudden stops;Head turns Side Stepping: modified independent Backwards Walking: modified independent Direction Changes: modified independent Sudden Stops: modified independent Head Turns: Supervision Stairs / Additional Locomotion Stairs: Yes Stairs Assistance: 5: Supervision Stairs Assistance Details (indicate cue type and reason): Cues for slowing down, utilizing "step to" pattern, sequencing of bil. LEs for safety.  Stair Management Technique: No rails;Step to pattern;Forwards Number of Stairs: 12   Extremity Assessment      RLE Assessment RLE Assessment: Within Functional Limits LLE Assessment LLE Assessment: Within Functional Limits  See FIM for current functional status  Lahoma Rocker 07/07/2012, 5:29 PM

## 2012-07-07 NOTE — Progress Notes (Signed)
Patient information reviewed and entered into eRehab system by Roylee Chaffin, RN, CRRN, PPS Coordinator.  Information including medical coding and functional independence measure will be reviewed and updated through discharge.    

## 2012-07-07 NOTE — Progress Notes (Signed)
Patient discharge to home with husband at 57.  Discharge information provide by Reesa Chew, PA.  Patient verbalize understanding and ready to go home.  Belonging packed, escorted off unit with NT.

## 2012-07-07 NOTE — Progress Notes (Signed)
Social Work  Social Work Assessment and Plan  Patient Details  Name: Catherine Aguilar MRN: [id number] Date of Birth: 01/01/1956  Today's Date: 07/07/2012  Problem List:  Patient Active Problem List  Diagnosis  . Accelerated hypertension  . Left facial numbness  . CVA (cerebral vascular accident)   Past Medical History:  Past Medical History  Diagnosis Date  . Hypertension     stopped taking medications 10 years ago   Past Surgical History:  Past Surgical History  Procedure Date  . Abdominal hysterectomy 1985   Social History:  reports that she has been smoking Cigarettes.  She has a 4 pack-year smoking history. She has never used smokeless tobacco. She reports that she drinks alcohol. She reports that she does not use illicit drugs.  Family / Support Systems Marital Status: Married Patient Roles: Spouse Spouse/Significant OtherLeialoha Cease @ [phone] or [phone] Anticipated Caregiver: Husband Ability/Limitations of Caregiver: Husband is on disability but can assist  Caregiver Availability: 24/7  Social History Preferred language: English Religion:  Cultural Background: NA Read: Yes Write: Yes Employment Status: Unemployed Freight forwarder Issues: none Guardian/Conservator: none   Abuse/Neglect Physical Abuse: Denies Verbal Abuse: Denies Sexual Abuse: Denies Exploitation of patient/patient's resources: Denies Self-Neglect: Denies  Emotional Status Pt's affect, behavior adn adjustment status: Pt very talkative and focused on discharging home TODAY - just admitted on Friday, however, reports she will have 24/7 and insists on d/c.  Not interested in further discussion with this SW.  Patient / Family Perceptions, Expectations & Goals Pt/Family understanding of illness & functional limitations: Pt with basic understanding that she did suffer a stroke, however, reports that she does not plan to change diet or personal care.  Is willing to take  medications if she can afford to do so. Premorbid pt/family roles/activities: independent - laid off workier Anticipated changes in roles/activities/participation: little change anticipated except need for some supervision  Pt/family expectations/goals: "I'm gonna go home today"  [employer]: None Premorbid Home Care/DME Agencies: None Transportation available at discharge: yes  Discharge Planning Living Arrangements: Spouse/significant other Support Systems: Spouse/significant other Type of Residence: Private residence Insurance underwriter Resources: Teacher, adult education Resources: Other (Comment) (husband receives SSD) Financial Screen Referred: Previously completed Living Expenses: Rent Money Management: Spouse Do you have any problems obtaining your medications?: Yes (Describe) (to access [facility] account for d/c meds) Home Management: pt and husband  Patient/Family Preliminary Plans: Pt fully intends to d/c home today - willing to stay through therapy day Expected length of stay: 3 days  Clinical Impression Brief assessment completed with pt as she reports she wants to d/c home today after therapies and MD has given medical clearance for this.  Pt has 24/7 supervision/ support and therapies not recommending any follow up therapies.  Will assist with 3 day supply of meds.  Malcolm Hetz 07/07/2012, 3:32 PM

## 2012-07-10 NOTE — Progress Notes (Signed)
Discharge summary # [id number]

## 2012-07-11 NOTE — Discharge Summary (Signed)
NAMESHARION, Aguilar NO.:  [id number]  MEDICAL RECORD NO.:  [id number]  LOCATION:  [id number]                         FACILITY:  [facility]  PHYSICIAN:  Meredith Staggers, M.D.DATE OF BIRTH:  May 23, 1956  DATE OF ADMISSION:  07/04/2012 DATE OF DISCHARGE:  07/07/2012                              DISCHARGE SUMMARY   DISCHARGE DIAGNOSES: 1. Left lateral medullary infarct. 2. Hypertension. 3. Dyslipidemia. 4. Impaired fasting glucose.  HISTORY OF PRESENT ILLNESS:  Catherine Aguilar is a 56 year old female with history of untreated hypertension x2 years, who was admitted July 01, 2012, with funny sensation in left face as well as unsteadiness.  Blood pressure at the time of admission was 218/110.  MRI of brain done revealed left lateral medullary infarct.  2D echo done showed EF of 60% to 65% without wall abnormality.  Carotid Doppler showed no ICA stenosis.  Neurology was consulted and recommended aspirin for CVA prophylaxis.  MBS was done revealing mild dysphagia, and the patient was started on D2 diet, thin liquids.  Therapy evaluations revealed the patient to be impulsive with gait and balance deficits. Therapy team recommended CIR for progression.  PAST MEDICAL HISTORY: 1. Hypertension, untreated for 10 years. 2. Abdominal hysterectomy.  FUNCTIONAL HISTORY:  The patient was independent prior to admission. 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  FUNCTIONAL STATUS:  The patient required min assist for transfers, min- to-mod assist for ambulating 200 feet without assistive device.  HOSPITAL COURSE:  [name] was admitted to Rehab on July 04, 2012, for inpatient therapies to consist of PT, OT at least 3 hours 5 days a week.  Past admission, physiatrist, rehab, RN, and therapy team have worked together to provide customized collaborative interdisciplinary care.  Labs were done past admission revealing sodium 139, potassium  4.0, chloride 105, CO2 of 24, BUN 15, creatinine 0.86, glucose 98.  CBC revealed H and H at 13.5 and 42.1, white count 8.2, platelets 354.  The patient's blood pressures were checked on b.i.d. basis, and these have ranged from Q000111Q to Q000111Q systolic, Q000111Q to 0000000 diastolic.  P.o. intake has been good.  The patient has been continent of bowel and bladder.  Attempts were made to educate the patient on smoking cessation as well as low fat and carb-modified diet and avoidance of alcohol; however, the patient reported that she was not interested in any of this information.  Physical Therapy has worked with the patient on balance as well as strengthening.  The patient was showing improvement in awareness as well as ability to compensate for deficits.  She was modified independent for transfers and mobility.  She was able to navigate 12 stairs with supervision with cues for safe, speed, and sequencing.  She was able to practice gait stabilization in horizontal and vertical plane with cues for speed and technique for optimizing outcomes.  OT has worked with the patient on self-care tasks with focus on safety with all mobility during BADLs and IADL tasks.  She was modified independent for self-care tasks.  Family education was done with the patient's boyfriend who will provide the patient with cuing to slow down for safety.  On July 07, 2012, the patient is discharged to home in improved condition.  DISCHARGE MEDICATIONS: 1. Coated aspirin 325 mg p.o. per day. 2. Flonase 1 squirt each nostril daily p.r.n. rhinitis. 3. Neurontin 300 mg p.o. b.i.d. 4. Hydralazine 50 mg p.o. q.8 h. 5. Pravastatin 40 mg p.o. q.p.m. 6. Altace 2.5 mg p.o. per day.  DIET:  Low fat, low cholesterol.  Monitor starches and sweets.  ACTIVITY LEVEL:  As tolerated with supervision.  Needs to slow down for safe mobility and home management tasks.  FOLLOWUP:  The patient to follow up with Dr. Alger Simons on  August 04, 2012, at 10:30 a.m.  Follow up with Dr. Antony Contras in 8 weeks. Follow up with primary care physician in the next 7-10 days.  The patient given information on [facility] as well as 3-day supply of meds.     Reesa Chew, P.A.   ______________________________ Meredith Staggers, M.D.    PL/MEDQ  D:  07/10/2012  T:  07/11/2012  Job:  [id number]  cc:   Pramod P. Leonie Man, MD [facility]

## 2012-08-04 ENCOUNTER — Encounter: Payer: Self-pay | Admitting: Physical Medicine & Rehabilitation

## 2012-08-04 ENCOUNTER — Encounter: Payer: Medicaid Other | Attending: Physical Medicine & Rehabilitation | Admitting: Physical Medicine & Rehabilitation

## 2012-08-04 VITALS — BP 158/93 | HR 77 | Resp 14 | Wt 193.8 lb

## 2012-08-04 DIAGNOSIS — R200 Anesthesia of skin: Secondary | ICD-10-CM

## 2012-08-04 DIAGNOSIS — R209 Unspecified disturbances of skin sensation: Secondary | ICD-10-CM

## 2012-08-04 DIAGNOSIS — M79609 Pain in unspecified limb: Secondary | ICD-10-CM | POA: Insufficient documentation

## 2012-08-04 DIAGNOSIS — R51 Headache: Secondary | ICD-10-CM | POA: Insufficient documentation

## 2012-08-04 DIAGNOSIS — I639 Cerebral infarction, unspecified: Secondary | ICD-10-CM

## 2012-08-04 DIAGNOSIS — I10 Essential (primary) hypertension: Secondary | ICD-10-CM | POA: Insufficient documentation

## 2012-08-04 DIAGNOSIS — I6350 Cerebral infarction due to unspecified occlusion or stenosis of unspecified cerebral artery: Secondary | ICD-10-CM | POA: Insufficient documentation

## 2012-08-04 DIAGNOSIS — G894 Chronic pain syndrome: Secondary | ICD-10-CM | POA: Insufficient documentation

## 2012-08-04 MED ORDER — LOVASTATIN 20 MG PO TABS: 20.0000 mg | ORAL_TABLET | Freq: Every day | ORAL | Status: DC

## 2012-08-04 MED ORDER — LISINOPRIL 10 MG PO TABS: 10.0000 mg | ORAL_TABLET | Freq: Every day | ORAL | Status: DC

## 2012-08-04 MED ORDER — NORTRIPTYLINE HCL 10 MG PO CAPS: 10.0000 mg | ORAL_CAPSULE | Freq: Every day | ORAL | Status: DC

## 2012-08-04 MED ORDER — CARBAMAZEPINE ER 200 MG PO TB12: 200.0000 mg | ORAL_TABLET | Freq: Two times a day (BID) | ORAL | Status: DC

## 2012-08-04 MED ORDER — HYDRALAZINE HCL 25 MG PO TABS: 50.0000 mg | ORAL_TABLET | Freq: Three times a day (TID) | ORAL | Status: DC

## 2012-08-04 NOTE — Progress Notes (Signed)
Subjective:    Patient ID: Catherine Aguilar, female    DOB: 1955/12/03, 56 y.o.   MRN: [id number]  HPI  Catherine Aguilar is back regarding her L Lateral Medullary infarct. She is exercising daily at home. She is having pain still on the left side of her face, left arm, and leg.  She uses a cane for balance. She has had many near falls.   She is concerned about the cost of medications. Her BP has been high. She has been checking at home routinely. She continues her meds as prescribed.   She has put on 20lbs since she went home.   Mood has been an issue at times as well per significant other.    Pain Inventory Average Pain 10 Pain Right Now 10 My pain is sharp, tingling and numbness  In the last 24 hours, has pain interfered with the following? General activity 10 Relation with others 10 Enjoyment of life 10 What TIME of day is your pain at its worst? all of the time Sleep (in general) Fair  Pain is worse with: walking, bending, sitting, inactivity, standing and some activites Pain improves with: rest, heat/ice, therapy/exercise, pacing activities and medication Relief from Meds: headache never goes away  Mobility use a cane ability to climb steps?  no do you drive?  no  Function disabled: date disabled applying  Neuro/Psych bladder control problems weakness numbness tremor tingling trouble walking dizziness confusion depression anxiety loss of taste or smell  Prior Studies Any changes since last visit?  no  Physicians involved in your care Any changes since last visit?  no   Family History  Problem Relation Age of Onset  . Cancer Father   . Heart attack Mother    History   Social History  . Marital Status: Married    Spouse Name: N/A    Number of Children: N/A  . Years of Education: N/A   Social History Main Topics  . Smoking status: Current Every Day Smoker -- 0.1 packs/day for 40 years    Types: Cigarettes  . Smokeless tobacco: Never Used  .  Alcohol Use: Yes     Comment: on the weekends - moderately per pt  . Drug Use: No  . Sexually Active:    Other Topics Concern  . None   Social History Narrative  . None   Past Surgical History  Procedure Date  . Abdominal hysterectomy 1985   Past Medical History  Diagnosis Date  . Hypertension     stopped taking medications 10 years ago  . Stroke    BP 158/93  Pulse 77  Resp 14  Wt 193 lb 12.8 oz (87.907 kg)  SpO2 99%    Review of Systems  Constitutional: Positive for unexpected weight change.       Weight gain of approx 25 lbs in a month/loss of taste and smell  Eyes:       Vision still bad on left side  Respiratory: Positive for cough and shortness of breath.        Has cough at night since ramipril  Gastrointestinal: Positive for constipation.  Musculoskeletal: Positive for gait problem.       Left side of body pain  Neurological: Positive for dizziness, weakness, numbness and headaches.       Tingling  Psychiatric/Behavioral: Positive for confusion and dysphoric mood. The patient is nervous/anxious.        Objective:   Physical Exam  General: Alert and oriented x 3, No  apparent distress HEENT: Head is normocephalic, atraumatic, PERRLA, EOMI, sclera anicteric, oral mucosa pink and moist, dentition intact, ext ear canals clear,  Neck: Supple without JVD or lymphadenopathy Heart: Reg rate and rhythm. No murmurs rubs or gallops Chest: CTA bilaterally without wheezes, rales, or rhonchi; no distress Abdomen: Soft, non-tender, non-distended, bowel sounds positive. Extremities: No clubbing, cyanosis, or edema. Pulses are 2+ Skin: Clean and intact without signs of breakdown Neuro: Pt is cognitively appropriate with normal insight, memory, and awareness. Cranial nerves 2-12 are notable for decreased sensation over the left face. Wide based, ataxic gait, frequently falls to the left. LUE is 3-4/5. 4/5 LLE. Sensory exam is 1/2. Musculoskeletal: Full ROM, No pain with  AROM or PROM in the neck, trunk, or extremities. Posture appropriate Psych: Pt's affect is appropriate. Pt is cooperative         Assessment & Plan:  1. L Lateral Medullary infarct  2. HTN 3. Pain syndrome due to above   Plan: 1.Begin tegretol 200mg  bid for neuropathic pain. Also will start pamelor at night to help with sleep, mood, and pain 2. For BP will change her ramipril to lisinopril 10mg  qday. Can redose he hydralazine to the 25mg  tabs to qualify for [facility]'s program 3. Change pravastatin to lovastatin 20mg  qday 4. Would benefit from rolling walker- will write an rx and see if she can get help through advance home care. She is at extremely high risk for falling. Needs to use better safety awareness also. 5. Follow up with me in 3 months. 30 minutes of face to face patient care time were spent during this visit. All questions were encouraged and answered.

## 2012-08-04 NOTE — Patient Instructions (Signed)
YOU NEED TO USE A WALKER FOR BALANCE!

## 2012-10-14 ENCOUNTER — Telehealth: Payer: Self-pay

## 2012-10-14 NOTE — Telephone Encounter (Signed)
Patient called to say the lovastatin was causing her to have numbness and tingling.  She is not happy how it is working.  Advised her to follow up with her primary care giver immediately.  Also advised her to discontinue medication until then.

## 2012-11-04 ENCOUNTER — Encounter: Payer: 59 | Admitting: Physical Medicine & Rehabilitation

## 2015-09-11 ENCOUNTER — Emergency Department (HOSPITAL_COMMUNITY)
Admission: EM | Admit: 2015-09-11 | Discharge: 2015-09-11 | Disposition: A | Discharge status: Home / Self Care | Payer: Medicaid Other | Attending: Emergency Medicine | Admitting: Emergency Medicine

## 2015-09-11 ENCOUNTER — Emergency Department (HOSPITAL_COMMUNITY): Payer: Medicaid Other

## 2015-09-11 ENCOUNTER — Encounter (HOSPITAL_COMMUNITY): Payer: Self-pay | Admitting: Emergency Medicine

## 2015-09-11 DIAGNOSIS — F17210 Nicotine dependence, cigarettes, uncomplicated: Secondary | ICD-10-CM | POA: Insufficient documentation

## 2015-09-11 DIAGNOSIS — Z8673 Personal history of transient ischemic attack (TIA), and cerebral infarction without residual deficits: Secondary | ICD-10-CM | POA: Insufficient documentation

## 2015-09-11 DIAGNOSIS — I10 Essential (primary) hypertension: Secondary | ICD-10-CM | POA: Insufficient documentation

## 2015-09-11 DIAGNOSIS — Z79899 Other long term (current) drug therapy: Secondary | ICD-10-CM | POA: Diagnosis not present

## 2015-09-11 DIAGNOSIS — M79672 Pain in left foot: Secondary | ICD-10-CM

## 2015-09-11 DIAGNOSIS — E7800 Pure hypercholesterolemia, unspecified: Secondary | ICD-10-CM | POA: Insufficient documentation

## 2015-09-11 DIAGNOSIS — Z7982 Long term (current) use of aspirin: Secondary | ICD-10-CM | POA: Insufficient documentation

## 2015-09-11 DIAGNOSIS — M19072 Primary osteoarthritis, left ankle and foot: Secondary | ICD-10-CM

## 2015-09-11 HISTORY — DX: Pure hypercholesterolemia, unspecified: E78.00

## 2015-09-11 MED ORDER — NAPROXEN 500 MG PO TABS
500.0000 mg | ORAL_TABLET | Freq: Once | ORAL | Status: AC
  Administered 2015-09-11: 500 mg via ORAL
  Filled 2015-09-11: qty 1

## 2015-09-11 MED ORDER — INDOMETHACIN 25 MG PO CAPS: 25.0000 mg | ORAL_CAPSULE | Freq: Three times a day (TID) | ORAL | Status: DC | PRN

## 2015-09-11 MED ORDER — OXYCODONE-ACETAMINOPHEN 5-325 MG PO TABS
1.0000 | ORAL_TABLET | Freq: Once | ORAL | Status: AC
  Administered 2015-09-11: 1 via ORAL
  Filled 2015-09-11: qty 1

## 2015-09-11 MED ORDER — HYDROCODONE-ACETAMINOPHEN 5-325 MG PO TABS: 1.0000 | ORAL_TABLET | Freq: Four times a day (QID) | ORAL | Status: DC | PRN

## 2015-09-11 NOTE — ED Provider Notes (Signed)
CSN: [id number]     Arrival date & time 09/11/15  2004 History  By signing my name below, I, Irene Pap, attest that this documentation has been prepared under the direction and in the presence of [employer], PA-C. Electronically Signed: Irene Pap, ED Scribe. 09/11/2015. 8:59 PM.  Chief Complaint  Patient presents with  . Foot Pain   The history is provided by the patient. No language interpreter was used.  HPI Comments: Catherine Aguilar is a 60 y.o. Female with a hx of HTN and stroke brought in by EMS who presents to the Emergency Department complaining of gradually worsening, constant, throbbing pain to the dorsum of the left foot onset today. Pt reports associated swelling. She has not taken anything for her symptoms. She states that she is not on her feet a lot. Pt reports that she eats a lot of pork and she is not supposed to, which she believes may be contributing to her pain stating she may have "porkeritis". She denies hx of cancer, hx of gout, falls, injury, fever, numbness, or weakness.    Past Medical History  Diagnosis Date  . Hypertension     stopped taking medications 10 years ago  . Stroke ([place])   . High cholesterol    Past Surgical History  Procedure Laterality Date  . Abdominal hysterectomy  1985   Family History  Problem Relation Age of Onset  . Cancer Father   . Heart attack Mother    Social History  Substance Use Topics  . Smoking status: Current Every Day Smoker -- 0.10 packs/day for 40 years    Types: Cigarettes  . Smokeless tobacco: Never Used  . Alcohol Use: Yes     Comment: on the weekends - moderately per pt   OB History    No data available      Review of Systems  Constitutional: Negative for fever.  Musculoskeletal: Positive for joint swelling and arthralgias.  Neurological: Negative for weakness and numbness.  All other systems reviewed and are negative.   Allergies  Review of patient's allergies indicates no known  allergies.  Home Medications   Prior to Admission medications   Medication Sig Start Date End Date Taking? Authorizing Provider  aspirin 325 MG tablet Take 1 tablet (325 mg total) by mouth daily. 07/07/12   Bary Leriche, PA-C  carbamazepine (TEGRETOL XR) 200 MG 12 hr tablet Take 1 tablet (200 mg total) by mouth 2 (two) times daily. 08/04/12   Meredith Staggers, MD  fluticasone (FLONASE) 50 MCG/ACT nasal spray Place 1 spray into the nose daily as needed for rhinitis. For congestion. 07/07/12   Ivan Anchors Love, PA-C  gabapentin (NEURONTIN) 300 MG capsule Take 1 capsule (300 mg total) by mouth 2 (two) times daily. For numbness left side 07/07/12   Ivan Anchors Love, PA-C  hydrALAZINE (APRESOLINE) 25 MG tablet Take 2 tablets (50 mg total) by mouth 3 (three) times daily. 08/04/12   Meredith Staggers, MD  HYDROcodone-acetaminophen (NORCO/VICODIN) 5-325 MG tablet Take 1 tablet by mouth every 6 (six) hours as needed for severe pain. 09/11/15   Antonietta Breach, PA-C  indomethacin (INDOCIN) 25 MG capsule Take 1 capsule (25 mg total) by mouth 3 (three) times daily as needed. 09/11/15   Antonietta Breach, PA-C  lisinopril (PRINIVIL) 10 MG tablet Take 1 tablet (10 mg total) by mouth daily. 08/04/12   Meredith Staggers, MD  lovastatin (MEVACOR) 20 MG tablet Take 1 tablet (20 mg total) by mouth at  bedtime. 08/04/12   Meredith Staggers, MD  nortriptyline (PAMELOR) 10 MG capsule Take 1-2 capsules (10-20 mg total) by mouth at bedtime. 08/04/12   Meredith Staggers, MD  pravastatin (PRAVACHOL) 40 MG tablet Take 1 tablet (40 mg total) by mouth daily. For elevated cholesterol 07/07/12   Ivan Anchors Love, PA-C  ramipril (ALTACE) 2.5 MG capsule Take 1 capsule (2.5 mg total) by mouth daily. For blood pressure 07/07/12   Ivan Anchors Love, PA-C   BP 165/82 mmHg  Pulse 92  Temp(Src) 99.3 F (37.4 C) (Oral)  Resp 20  Ht 5\' 9"  (1.753 m)  Wt 96.616 kg  BMI 31.44 kg/m2  SpO2 96%   Physical Exam  Constitutional: She is oriented to person, place,  and time. She appears well-developed and well-nourished. No distress.  Nontoxic/nonseptic appearing  HENT:  Head: Normocephalic and atraumatic.  Eyes: Conjunctivae and EOM are normal. No scleral icterus.  Neck: Normal range of motion.  Cardiovascular: Normal rate, regular rhythm and intact distal pulses.   DP and PT pulses 2+ in the LLE  Pulmonary/Chest: Effort normal. No respiratory distress.  Respirations even and unlabored.  Musculoskeletal: Normal range of motion.       Left ankle: Normal.       Left foot: There is tenderness and swelling. There is normal range of motion, no bony tenderness, no crepitus, no deformity and no laceration.       Feet:  TTP to the dorsum and dorsal lateral aspect of the L foot with mild associated swelling; nonpitting. No calf TTP. No bony deformity or crepitus. Compartments soft.  Neurological: She is alert and oriented to person, place, and time. She exhibits normal muscle tone. Coordination normal.  Sensation to light touch intact in LLE. Patient able to wiggle all toes of L foot.  Skin: Skin is warm and dry. No rash noted. She is not diaphoretic. No erythema. No pallor.  No erythema or heat to touch to the LLE. No red linear streaking.  Psychiatric: She has a normal mood and affect. Her behavior is normal.  Nursing note and vitals reviewed.   ED Course  Procedures (including critical care time) DIAGNOSTIC STUDIES: Oxygen Saturation is 96% on RA, normal by my interpretation.    COORDINATION OF CARE: 8:58 PM-Discussed treatment plan which includes x-ray and anti-inflammatories with pt at bedside and pt agreed to plan.   Labs Review Labs Reviewed - No data to display  Imaging Review Dg Foot Complete Left  09/11/2015  CLINICAL DATA:  Left foot pain starting today, especially dorsally. EXAM: LEFT FOOT - COMPLETE 3+ VIEW COMPARISON:  None. FINDINGS: Osteoarthritis with reduced interphalangeal articular space. Lisfranc joint alignment normal. No  compelling signs of metatarsal stress fracture. No discrete midfoot abnormality. Mild dorsal soft tissue swelling along the distal metatarsals. IMPRESSION: 1. There is potentially mild soft tissue swelling dorsal to the distal metatarsals but no underlying bony abnormality is observed aside from mild osteoarthritis. Electronically Signed   By: Van Clines M.D.   On: 09/11/2015 21:30     I have personally reviewed and evaluated these images and lab results as part of my medical decision-making.   EKG Interpretation None      MDM   Final diagnoses:  Osteoarthritis of left foot, unspecified osteoarthritis type  Foot pain, left    60 year old female presents to the emergency department for evaluation of less than 24 hours of atraumatic left foot pain. There is mild associated swelling without pitting edema. No calf  tenderness. No signs of cellulitis or abscess such as erythema or heat to touch. Doubt septic joint or hemarthrosis. Patient is afebrile and well-appearing. X-ray shows mild osteoarthritis. Question underlying gouty arthritis, however, the patient has no history of gout and symptoms do not appear to be confined to a single joint. Will treat with NSAIDs and icing. Short course of Norco given for pain. Have also provided ACE wrap and post op shoe for comfort. Patient referred to her primary care doctor for follow-up. Return precautions given at discharge. Patient agreeable to plan with no unaddressed concerns. Patient discharged in satisfactory condition.  I personally performed the services described in this documentation, which was scribed in my presence. The recorded information has been reviewed and is accurate.    Filed Vitals:   09/11/15 2017 09/11/15 2024  BP: 165/82   Pulse: 92   Temp: 99.3 F (37.4 C)   TempSrc: Oral   Resp: 20   Height: 5\' 9"  (1.753 m)   Weight: 96.616 kg   SpO2: 96% 96%      Antonietta Breach, PA-C 09/11/15 2151  Noemi Chapel, MD 09/11/15  2337

## 2015-09-11 NOTE — Discharge Instructions (Signed)
Take indomethacin as prescribed for symptoms. You may try wrapping her foot to limit swelling. Ice your foot 3-4 times per day for 15-20 minutes each time for swelling and pain. You may take Norco as needed for severe pain. Wear a postop shoe as needed for comfort. Follow-up with your primary care doctor.  Osteoarthritis Osteoarthritis is a disease that causes soreness and inflammation of a joint. It occurs when the cartilage at the affected joint wears down. Cartilage acts as a cushion, covering the ends of bones where they meet to form a joint. Osteoarthritis is the most common form of arthritis. It often occurs in older people. The joints affected most often by this condition include those in the:  Ends of the fingers.  Thumbs.  Neck.  Lower back.  Knees.  Hips. CAUSES  Over time, the cartilage that covers the ends of bones begins to wear away. This causes bone to rub on bone, producing pain and stiffness in the affected joints.  RISK FACTORS Certain factors can increase your chances of having osteoarthritis, including:  Older age.  Excessive body weight.  Overuse of joints.  Previous joint injury. SIGNS AND SYMPTOMS   Pain, swelling, and stiffness in the joint.  Over time, the joint may lose its normal shape.  Small deposits of bone (osteophytes) may grow on the edges of the joint.  Bits of bone or cartilage can break off and float inside the joint space. This may cause more pain and damage. DIAGNOSIS  Your health care provider will do a physical exam and ask about your symptoms. Various tests may be ordered, such as:  X-rays of the affected joint.  Blood tests to rule out other types of arthritis. Additional tests may be used to diagnose your condition. TREATMENT  Goals of treatment are to control pain and improve joint function. Treatment plans may include:  A prescribed exercise program that allows for rest and joint relief.  A weight control plan.  Pain  relief techniques, such as:  Properly applied heat and cold.  Electric pulses delivered to nerve endings under the skin (transcutaneous electrical nerve stimulation [TENS]).  Massage.  Certain nutritional supplements.  Medicines to control pain, such as:  Acetaminophen.  Nonsteroidal anti-inflammatory drugs (NSAIDs), such as naproxen.  Narcotic or central-acting agents, such as tramadol.  Corticosteroids. These can be given orally or as an injection.  Surgery to reposition the bones and relieve pain (osteotomy) or to remove loose pieces of bone and cartilage. Joint replacement may be needed in advanced states of osteoarthritis. HOME CARE INSTRUCTIONS   Take medicines only as directed by your health care provider.  Maintain a healthy weight. Follow your health care provider's instructions for weight control. This may include dietary instructions.  Exercise as directed. Your health care provider can recommend specific types of exercise. These may include:  Strengthening exercises. These are done to strengthen the muscles that support joints affected by arthritis. They can be performed with weights or with exercise bands to add resistance.  Aerobic activities. These are exercises, such as brisk walking or low-impact aerobics, that get your heart pumping.  Range-of-motion activities. These keep your joints limber.  Balance and agility exercises. These help you maintain daily living skills.  Rest your affected joints as directed by your health care provider.  Keep all follow-up visits as directed by your health care provider. SEEK MEDICAL CARE IF:   Your skin turns red.  You develop a rash in addition to your joint pain.  You have worsening joint pain.  You have a fever along with joint or muscle aches. SEEK IMMEDIATE MEDICAL CARE IF:  You have a significant loss of weight or appetite.  You have night sweats. [place] of Arthritis  and Musculoskeletal and Skin Diseases: www.niams.SouthExposed.es  [employer] on Aging: http://kim-miller.com/  American College of Rheumatology: www.rheumatology.org   This information is not intended to replace advice given to you by your health care provider. Make sure you discuss any questions you have with your health care provider.   Document Released: 08/27/2005 Document Revised: 09/17/2014 Document Reviewed: 05/04/2013 Elsevier Interactive Patient Education [employer].

## 2015-09-11 NOTE — ED Notes (Signed)
Patient transported to X-ray 

## 2015-09-11 NOTE — ED Notes (Signed)
Pt brought in by EMS wth c/o left foot pain  Pt states the top of her foot started hurting today  Pt has slight swelling noted to the foot  Denies injury

## 2015-10-23 ENCOUNTER — Emergency Department (HOSPITAL_COMMUNITY): Payer: Medicaid Other

## 2015-10-23 ENCOUNTER — Encounter (HOSPITAL_COMMUNITY): Payer: Self-pay | Admitting: Emergency Medicine

## 2015-10-23 ENCOUNTER — Emergency Department (HOSPITAL_COMMUNITY)
Admission: EM | Admit: 2015-10-23 | Discharge: 2015-10-23 | Disposition: A | Discharge status: Home / Self Care | Payer: Medicaid Other | Attending: Emergency Medicine | Admitting: Emergency Medicine

## 2015-10-23 DIAGNOSIS — J181 Lobar pneumonia, unspecified organism: Secondary | ICD-10-CM

## 2015-10-23 DIAGNOSIS — R042 Hemoptysis: Secondary | ICD-10-CM

## 2015-10-23 DIAGNOSIS — E7800 Pure hypercholesterolemia, unspecified: Secondary | ICD-10-CM | POA: Diagnosis not present

## 2015-10-23 DIAGNOSIS — Z7982 Long term (current) use of aspirin: Secondary | ICD-10-CM | POA: Diagnosis not present

## 2015-10-23 DIAGNOSIS — Z8673 Personal history of transient ischemic attack (TIA), and cerebral infarction without residual deficits: Secondary | ICD-10-CM | POA: Diagnosis not present

## 2015-10-23 DIAGNOSIS — R040 Epistaxis: Secondary | ICD-10-CM | POA: Diagnosis present

## 2015-10-23 DIAGNOSIS — F17210 Nicotine dependence, cigarettes, uncomplicated: Secondary | ICD-10-CM | POA: Insufficient documentation

## 2015-10-23 DIAGNOSIS — Z79899 Other long term (current) drug therapy: Secondary | ICD-10-CM | POA: Diagnosis not present

## 2015-10-23 DIAGNOSIS — I10 Essential (primary) hypertension: Secondary | ICD-10-CM | POA: Insufficient documentation

## 2015-10-23 LAB — URINALYSIS, ROUTINE W REFLEX MICROSCOPIC
GLUCOSE, UA: NEGATIVE mg/dL
Hgb urine dipstick: NEGATIVE
KETONES UR: 15 mg/dL — AB
LEUKOCYTES UA: NEGATIVE
Nitrite: NEGATIVE
PH: 5 (ref 5.0–8.0)
Protein, ur: NEGATIVE mg/dL
SPECIFIC GRAVITY, URINE: 1.036 — AB (ref 1.005–1.030)

## 2015-10-23 LAB — CBC WITH DIFFERENTIAL/PLATELET
BASOS ABS: 0 10*3/uL (ref 0.0–0.1)
Basophils Relative: 0 %
EOS PCT: 3 %
Eosinophils Absolute: 0.2 10*3/uL (ref 0.0–0.7)
HCT: 28.6 % — ABNORMAL LOW (ref 36.0–46.0)
Hemoglobin: 8.7 g/dL — ABNORMAL LOW (ref 12.0–15.0)
LYMPHS ABS: 1.7 10*3/uL (ref 0.7–4.0)
LYMPHS PCT: 24 %
MCH: 23.2 pg — AB (ref 26.0–34.0)
MCHC: 30.4 g/dL (ref 30.0–36.0)
MCV: 76.3 fL — AB (ref 78.0–100.0)
MONO ABS: 0.3 10*3/uL (ref 0.1–1.0)
Monocytes Relative: 4 %
Neutro Abs: 4.8 10*3/uL (ref 1.7–7.7)
Neutrophils Relative %: 69 %
PLATELETS: 623 10*3/uL — AB (ref 150–400)
RBC: 3.75 MIL/uL — ABNORMAL LOW (ref 3.87–5.11)
RDW: 18.7 % — AB (ref 11.5–15.5)
WBC: 7 10*3/uL (ref 4.0–10.5)

## 2015-10-23 LAB — BASIC METABOLIC PANEL
Anion gap: 12 (ref 5–15)
BUN: 17 mg/dL (ref 6–20)
CALCIUM: 9.1 mg/dL (ref 8.9–10.3)
CO2: 19 mmol/L — ABNORMAL LOW (ref 22–32)
Chloride: 107 mmol/L (ref 101–111)
Creatinine, Ser: 1 mg/dL (ref 0.44–1.00)
GFR calc Af Amer: >60 mL/min (ref >60)
GFR, EST NON AFRICAN AMERICAN: 60 mL/min — AB (ref >60)
GLUCOSE: 181 mg/dL — AB (ref 65–99)
Potassium: 3.9 mmol/L (ref 3.5–5.1)
Sodium: 138 mmol/L (ref 135–145)

## 2015-10-23 LAB — RAPID STREP SCREEN (MED CTR MEBANE ONLY): Streptococcus, Group A Screen (Direct): NEGATIVE

## 2015-10-23 LAB — POC OCCULT BLOOD, ED: Fecal Occult Bld: NEGATIVE

## 2015-10-23 MED ORDER — AZITHROMYCIN 250 MG PO TABS
500.0000 mg | ORAL_TABLET | Freq: Once | ORAL | Status: AC
  Administered 2015-10-23: 500 mg via ORAL
  Filled 2015-10-23: qty 2

## 2015-10-23 MED ORDER — AZITHROMYCIN 250 MG PO TABS: 250.0000 mg | ORAL_TABLET | Freq: Every day | ORAL | Status: DC

## 2015-10-23 MED ORDER — HYDROCODONE-HOMATROPINE 5-1.5 MG/5ML PO SYRP
5.0000 mL | ORAL_SOLUTION | Freq: Once | ORAL | Status: AC
  Administered 2015-10-23: 5 mL via ORAL
  Filled 2015-10-23: qty 5

## 2015-10-23 MED ORDER — HYDROCODONE-HOMATROPINE 5-1.5 MG/5ML PO SYRP: 5.0000 mL | ORAL_SOLUTION | Freq: Four times a day (QID) | ORAL | Status: DC | PRN

## 2015-10-23 MED ORDER — AZITHROMYCIN 250 MG PO TABS: 500.0000 mg | ORAL_TABLET | Freq: Once | ORAL | Status: DC

## 2015-10-23 NOTE — ED Provider Notes (Signed)
CSN: [id number]     Arrival date & time 10/23/15  [id number] History   First MD Initiated Contact with Patient 10/23/15 [id number]     Chief Complaint  Patient presents with  . Epistaxis    The patient has been having nosebleeds for two weeks. Catherine Aguilar takes medications for blood pressure but it has still been high.  Catherine Aguilar has taken her medication today.  Her nose bleed started today at 0715.    (Consider location/radiation/quality/duration/timing/severity/associated sxs/prior Treatment) The history is provided by the patient. No language interpreter was used.   Catherine Aguilar is a 60 y.o female with a history of hypertension, stroke, and hyperlipidemia who presents for intermittent nosebleeds, productive blood sputum cough, lost her voice, and is short of breath 2 weeks. States her nose started bleeding around 7:15 this morning but resolved on its own. Catherine Aguilar states Catherine Aguilar was seen by her PCP and was given several medications for her symptoms but does not feel any better. Patient asked, "Can I go home today?" Catherine Aguilar reports that Catherine Aguilar has an excellent appetite. Catherine Aguilar denies any headache, vision changes, weakness, fever, chills, night sweats, chest pain, abdominal pain, nausea, vomiting, or diarrhea.  Past Medical History  Diagnosis Date  . Hypertension     stopped taking medications 10 years ago  . Stroke ([place])   . High cholesterol    Past Surgical History  Procedure Laterality Date  . Abdominal hysterectomy  1985   Family History  Problem Relation Age of Onset  . Cancer Father   . Heart attack Mother    Social History  Substance Use Topics  . Smoking status: Current Every Day Smoker -- 0.10 packs/day for 40 years    Types: Cigarettes  . Smokeless tobacco: Never Used  . Alcohol Use: Yes     Comment: on the weekends - moderately per pt   OB History    No data available     Review of Systems  Constitutional: Negative for fever and chills.  HENT: Positive for nosebleeds and sore throat.   Respiratory:  Positive for cough and shortness of breath.   Cardiovascular: Negative for chest pain.  Gastrointestinal: Negative for nausea, vomiting and abdominal pain.  All other systems reviewed and are negative.     Allergies  Review of patient's allergies indicates no known allergies.  Home Medications   Prior to Admission medications   Medication Sig Start Date End Date Taking? Authorizing Provider  aspirin 325 MG tablet Take 1 tablet (325 mg total) by mouth daily. 07/07/12  Yes Ivan Anchors Love, PA-C  carbamazepine (TEGRETOL XR) 200 MG 12 hr tablet Take 1 tablet (200 mg total) by mouth 2 (two) times daily. 08/04/12  Yes Meredith Staggers, MD  fluticasone (FLONASE) 50 MCG/ACT nasal spray Place 1 spray into the nose daily as needed for rhinitis. For congestion. 07/07/12  Yes Ivan Anchors Love, PA-C  gabapentin (NEURONTIN) 300 MG capsule Take 1 capsule (300 mg total) by mouth 2 (two) times daily. For numbness left side Patient taking differently: Take 300-900 mg by mouth 2 (two) times daily. 300 mg in the morning 900 mg in the evening 07/07/12  Yes Ivan Anchors Love, PA-C  hydrALAZINE (APRESOLINE) 50 MG tablet Take 50 mg by mouth 3 (three) times daily as needed. 10/08/15  Yes Historical Provider, MD  indomethacin (INDOCIN) 25 MG capsule Take 1 capsule (25 mg total) by mouth 3 (three) times daily as needed. 09/11/15  Yes Antonietta Breach, PA-C  lisinopril (PRINIVIL) 10 MG tablet  Take 1 tablet (10 mg total) by mouth daily. 08/04/12  Yes Meredith Staggers, MD  loratadine (CLARITIN) 10 MG tablet Take 10 mg by mouth daily. 09/28/15  Yes Historical Provider, MD  losartan (COZAAR) 100 MG tablet Take 100 mg by mouth daily. 10/08/15  Yes Historical Provider, MD  pravastatin (PRAVACHOL) 40 MG tablet Take 1 tablet (40 mg total) by mouth daily. For elevated cholesterol 07/07/12  Yes Ivan Anchors Love, PA-C  azithromycin (ZITHROMAX) 250 MG tablet Take 1 tablet (250 mg total) by mouth daily. Take 1 every day until finished. Received her  first dose in the ED. Take your first dose on Monday, 10/23/2005 10/24/15   Ottie Glazier, PA-C  hydrALAZINE (APRESOLINE) 25 MG tablet Take 2 tablets (50 mg total) by mouth 3 (three) times daily. 08/04/12   Meredith Staggers, MD  HYDROcodone-acetaminophen (NORCO/VICODIN) 5-325 MG tablet Take 1 tablet by mouth every 6 (six) hours as needed for severe pain. 09/11/15   Antonietta Breach, PA-C  HYDROcodone-homatropine (HYCODAN) 5-1.5 MG/5ML syrup Take 5 mLs by mouth every 6 (six) hours as needed for cough. 10/23/15   Tavarus Poteete Patel-Mills, PA-C  lovastatin (MEVACOR) 20 MG tablet Take 1 tablet (20 mg total) by mouth at bedtime. 08/04/12   Meredith Staggers, MD  nortriptyline (PAMELOR) 10 MG capsule Take 1-2 capsules (10-20 mg total) by mouth at bedtime. 08/04/12   Meredith Staggers, MD  ramipril (ALTACE) 2.5 MG capsule Take 1 capsule (2.5 mg total) by mouth daily. For blood pressure 07/07/12   Ivan Anchors Love, PA-C   BP 150/86 mmHg  Pulse 77  Temp(Src) 99.8 F (37.7 C) (Oral)  Resp 22  SpO2 96% Physical Exam  Constitutional: Catherine Aguilar is oriented to person, place, and time. Catherine Aguilar appears well-developed and well-nourished.  HENT:  Head: Normocephalic and atraumatic.  Oropharynx is clear and moist. No tonsillar edema or exudates. Uvula is midline. No anterior cervical lymphadenopathy.  No blood in the mouth or nares.  Eyes: Conjunctivae are normal.  Neck: Normal range of motion. Neck supple.  Cardiovascular: Normal rate, regular rhythm and normal heart sounds.   RRR. No murmur  Pulmonary/Chest: Effort normal and breath sounds normal.  Lungs clear to auscultation bilaterally. No wheezing or decreased breath sounds.  Abdominal: Soft. There is no tenderness.  Genitourinary:  Rectal exam, chaperone present: No anal fissures or hemorrhoids. No active bleeding. Brown stool.  Musculoskeletal: Normal range of motion.  Neurological: Catherine Aguilar is alert and oriented to person, place, and time.  Skin: Skin is warm and dry.   Nursing note and vitals reviewed.   ED Course  Procedures (including critical care time) Labs Review Labs Reviewed  CBC WITH DIFFERENTIAL/PLATELET - Abnormal; Notable for the following:    RBC 3.75 (*)    Hemoglobin 8.7 (*)    HCT 28.6 (*)    MCV 76.3 (*)    MCH 23.2 (*)    RDW 18.7 (*)    Platelets 623 (*)    All other components within normal limits  BASIC METABOLIC PANEL - Abnormal; Notable for the following:    CO2 19 (*)    Glucose, Bld 181 (*)    GFR calc non Af Amer 60 (*)    All other components within normal limits  URINALYSIS, ROUTINE W REFLEX MICROSCOPIC (NOT AT [facility]) - Abnormal; Notable for the following:    Color, Urine AMBER (*)    APPearance HAZY (*)    Specific Gravity, Urine 1.036 (*)    Bilirubin Urine SMALL (*)  Ketones, ur 15 (*)    All other components within normal limits  RAPID STREP SCREEN (NOT AT [facility])  CULTURE, GROUP A STREP ([place])  HEMOGLOBIN A1C  POC OCCULT BLOOD, ED    Imaging Review Dg Chest 2 View  10/23/2015  CLINICAL DATA:  Chest discomfort and hypertension EXAM: CHEST  2 VIEW COMPARISON:  None. FINDINGS: There is airspace consolidation throughout the right middle lobe. The lungs elsewhere clear. Heart is upper normal in size with pulmonary vascularity within normal limits. No adenopathy. No bone lesions. IMPRESSION: Right middle lobe airspace consolidation. Lungs elsewhere clear. No adenopathy evident. Followup PA and lateral chest radiographs recommended in 3-4 weeks following trial of antibiotic therapy to ensure resolution and exclude underlying malignancy. Electronically Signed   By: Lowella Grip III M.D.   On: 10/23/2015 09:58   I have personally reviewed and evaluated these images and lab results as part of my medical decision-making.  ED ECG REPORT   Date: 10/23/2015  Rate: 82  Rhythm: normal sinus rhythm  QRS Axis: normal  Intervals: normal  ST/T Wave abnormalities: normal  Conduction Disutrbances:none  Narrative  Interpretation:   Old EKG Reviewed: unchanged  I have personally reviewed the EKG tracing and agree with the computerized printout as noted.   MDM   Final diagnoses:  Lung consolidation ([place])  Hemoptysis   Patient presents for multiple upper respiratory symptoms, hemoptysis, and nosebleeds. Is not currently on a nosebleed.  Catherine Aguilar has been spitting up sputum with blood streaks. Her hemoglobin is 8.7. Denies any dizziness,  chest pain, lightheadedness or vaginal or rectal bleeding, or hematemesis. Glucose 181. Hemoccult negative. Chest x-ray shows right middle lobe airspace consolidation. Catherine Aguilar will be put on a Zithromax and given cough medicine. I discussed follow-up with Dr. Berdine Addison who is her primary care physician. I also explained findings on x-ray. Return precautions discussed and patient agrees with plan. Medications  HYDROcodone-homatropine (HYCODAN) 5-1.5 MG/5ML syrup 5 mL (5 mLs Oral Given 10/23/15 1026)  azithromycin (ZITHROMAX) tablet 500 mg (500 mg Oral Given 10/23/15 1108)   Filed Vitals:   10/23/15 1045 10/23/15 1100  BP: 151/86 150/86  Pulse: 75 77  Temp:    Resp: 22 [address], PA-C 10/23/15 [place], PA-C 10/23/15 [place], DO 10/23/15 1621

## 2015-10-23 NOTE — ED Notes (Signed)
The patient has been having nosebleeds for two weeks. She takes medications for blood pressure but it has still been high.  She has taken her medication today.  Her nose bleed started today at 0715.   The patient denies pain, but EMS did says she is hoarse and sounds like she has a cold.

## 2015-10-23 NOTE — ED Notes (Signed)
Pt ambulated to the bathroom with ease

## 2015-10-23 NOTE — ED Notes (Addendum)
Patient is alert and orientedx4.  Patient was explained discharge instructions and they understood them with no questions.   The patient is going to wait in the waiting room for her family to come get her.  She could not get in touch with anyone but she's going to wait.

## 2015-10-23 NOTE — Discharge Instructions (Signed)
Hemoptysis Hemoglobin is 8.5. You have a concerning chest x-ray. You'll need further workup and should follow-up with Dr. Berdine Addison. Take antibiotics as prescribed. Get a repeat chest x-ray in 3-4 weeks. Return for difficulty breathing, shortness of breath, dizziness, chest pain, or lightheadedness. Hemoptysis, which means coughing up blood, can be a sign of a minor problem or a serious medical condition. The blood that is coughed up may come from the lungs and airways. Coughed-up blood can also come from bleeding that occurs outside the lungs and airways. Blood can drain into the windpipe during a severe nosebleed or when blood is vomited from the stomach. Because hemoptysis can be a sign of something serious, a medical evaluation is required. For some people with hemoptysis, no definite cause is ever identified. CAUSES  The most common cause of hemoptysis is bronchitis. Some other common causes include:   A ruptured blood vessel caused by coughing or an infection.   A medical condition that causes damage to the large air passageways (bronchiectasis).   A blood clot in the lungs (pulmonary embolism).   Pneumonia.   Tuberculosis.   Breathing in a small foreign object.   Cancer. For some people with hemoptysis, no definite cause is ever identified.  HOME CARE INSTRUCTIONS  Only take over-the-counter or prescription medicines as directed by your caregiver. Do not use cough suppressants unless your caregiver approves.  If your caregiver prescribes antibiotic medicines, take them as directed. Finish them even if you start to feel better.  Do not smoke. Also avoid secondhand smoke.  Follow up with your caregiver as directed. SEEK IMMEDIATE MEDICAL CARE IF:   You cough up bloody mucus for longer than a week.  You have a blood-producing cough that is severe or getting worse.  You have a blood-producing cough thatcomes and goes over time.  You develop problems with your breathing.    You vomit blood.  You develop bloody or black-colored stools.  You have chest pain.   You develop night sweats.  You feel faint or pass out.   You have a fever or persistent symptoms for more than 2-3 days.  You have a fever and your symptoms suddenly get worse. MAKE SURE YOU:  Understand these instructions.  Will watch your condition.  Will get help right away if you are not doing well or get worse.   This information is not intended to replace advice given to you by your health care provider. Make sure you discuss any questions you have with your health care provider.   Document Released: 11/05/2001 Document Revised: 08/13/2012 Document Reviewed: 06/13/2012 Elsevier Interactive Patient Education [employer].

## 2015-10-24 LAB — HEMOGLOBIN A1C
HEMOGLOBIN A1C: 6.8 % — AB (ref 4.8–5.6)
Mean Plasma Glucose: 148 mg/dL

## 2015-10-25 LAB — CULTURE, GROUP A STREP (THRC)

## 2015-11-27 ENCOUNTER — Encounter (HOSPITAL_COMMUNITY): Payer: Self-pay | Admitting: Emergency Medicine

## 2015-11-27 ENCOUNTER — Emergency Department (HOSPITAL_COMMUNITY): Payer: Medicaid Other

## 2015-11-27 ENCOUNTER — Emergency Department (HOSPITAL_COMMUNITY)
Admission: EM | Admit: 2015-11-27 | Discharge: 2015-11-28 | Disposition: A | Discharge status: Home / Self Care | Payer: Medicaid Other | Attending: Emergency Medicine | Admitting: Emergency Medicine

## 2015-11-27 DIAGNOSIS — J40 Bronchitis, not specified as acute or chronic: Secondary | ICD-10-CM | POA: Diagnosis not present

## 2015-11-27 DIAGNOSIS — I10 Essential (primary) hypertension: Secondary | ICD-10-CM | POA: Insufficient documentation

## 2015-11-27 DIAGNOSIS — R490 Dysphonia: Secondary | ICD-10-CM

## 2015-11-27 DIAGNOSIS — E7800 Pure hypercholesterolemia, unspecified: Secondary | ICD-10-CM | POA: Diagnosis not present

## 2015-11-27 DIAGNOSIS — F17210 Nicotine dependence, cigarettes, uncomplicated: Secondary | ICD-10-CM | POA: Insufficient documentation

## 2015-11-27 DIAGNOSIS — Z792 Long term (current) use of antibiotics: Secondary | ICD-10-CM | POA: Insufficient documentation

## 2015-11-27 DIAGNOSIS — Z7982 Long term (current) use of aspirin: Secondary | ICD-10-CM | POA: Diagnosis not present

## 2015-11-27 DIAGNOSIS — Z8673 Personal history of transient ischemic attack (TIA), and cerebral infarction without residual deficits: Secondary | ICD-10-CM | POA: Insufficient documentation

## 2015-11-27 DIAGNOSIS — Z79899 Other long term (current) drug therapy: Secondary | ICD-10-CM | POA: Diagnosis not present

## 2015-11-27 DIAGNOSIS — Z7951 Long term (current) use of inhaled steroids: Secondary | ICD-10-CM | POA: Insufficient documentation

## 2015-11-27 DIAGNOSIS — R0602 Shortness of breath: Secondary | ICD-10-CM | POA: Diagnosis present

## 2015-11-27 MED ORDER — ALBUTEROL SULFATE (2.5 MG/3ML) 0.083% IN NEBU: 5.0000 mg | INHALATION_SOLUTION | Freq: Once | RESPIRATORY_TRACT | Status: DC

## 2015-11-27 MED ORDER — DEXAMETHASONE SODIUM PHOSPHATE 10 MG/ML IJ SOLN
10.0000 mg | Freq: Once | INTRAMUSCULAR | Status: AC
  Administered 2015-11-28: 10 mg via INTRAVENOUS
  Filled 2015-11-27: qty 1

## 2015-11-27 MED ORDER — IPRATROPIUM-ALBUTEROL 0.5-2.5 (3) MG/3ML IN SOLN
3.0000 mL | Freq: Once | RESPIRATORY_TRACT | Status: AC
  Administered 2015-11-27: 3 mL via RESPIRATORY_TRACT
  Filled 2015-11-27: qty 3

## 2015-11-27 NOTE — ED Notes (Signed)
Pt arrives by [facility] with c/o of difficulty breathing and cough. Pt was dx with bronchitis 3 weeks ago and was given zpak, tessalon pearls and cough syrup, states the medications are not helping. Tonight she was trying to lie down and felt like throat and nose were blocked and couldn't breathe. 5mg  albuterol given by EMS. 20G placed in left hand. No c/o pain/n/v, afebrile, productive cough (clear, thin). Last vitals 127/81, HR 96, 98% RA, CBG 150.

## 2015-11-27 NOTE — ED Notes (Signed)
Patient transported to x-ray. ?

## 2015-11-28 MED ORDER — ALBUTEROL SULFATE HFA 108 (90 BASE) MCG/ACT IN AERS
2.0000 | INHALATION_SPRAY | RESPIRATORY_TRACT | Status: DC | PRN
  Administered 2015-11-28: 2 via RESPIRATORY_TRACT
  Filled 2015-11-28: qty 6.7

## 2015-11-28 MED ORDER — AEROCHAMBER PLUS W/MASK MISC
1.0000 | Freq: Once | Status: AC
  Administered 2015-11-28: 1
  Filled 2015-11-28: qty 1

## 2015-11-28 NOTE — Discharge Instructions (Signed)
How to Use an Inhaler Proper inhaler technique is very important. Good technique ensures that the medicine reaches the lungs. Poor technique results in depositing the medicine on the tongue and back of the throat rather than in the airways. If you do not use the inhaler with good technique, the medicine will not help you. STEPS TO FOLLOW IF USING AN INHALER WITHOUT AN EXTENSION TUBE 1. Remove the cap from the inhaler. 2. If you are using the inhaler for the first time, you will need to prime it. Shake the inhaler for 5 seconds and release four puffs into the air, away from your face. Ask your health care provider or pharmacist if you have questions about priming your inhaler. 3. Shake the inhaler for 5 seconds before each breath in (inhalation). 4. Position the inhaler so that the top of the canister faces up. 5. Put your index finger on the top of the medicine canister. Your thumb supports the bottom of the inhaler. 6. Open your mouth. 7. Either place the inhaler between your teeth and place your lips tightly around the mouthpiece, or hold the inhaler 1-2 inches away from your open mouth. If you are unsure of which technique to use, ask your health care provider. 8. Breathe out (exhale) normally and as completely as possible. 9. Press the canister down with your index finger to release the medicine. 10. At the same time as the canister is pressed, inhale deeply and slowly until your lungs are completely filled. This should take 4-6 seconds. Keep your tongue down. 11. Hold the medicine in your lungs for 5-10 seconds (10 seconds is best). This helps the medicine get into the small airways of your lungs. 12. Breathe out slowly, through pursed lips. Whistling is an example of pursed lips. 13. Wait at least 15-30 seconds between puffs. Continue with the above steps until you have taken the number of puffs your health care provider has ordered. Do not use the inhaler more than your health care provider  tells you. 14. Replace the cap on the inhaler. 15. Follow the directions from your health care provider or the inhaler insert for cleaning the inhaler. STEPS TO FOLLOW IF USING AN INHALER WITH AN EXTENSION (SPACER) 1. Remove the cap from the inhaler. 2. If you are using the inhaler for the first time, you will need to prime it. Shake the inhaler for 5 seconds and release four puffs into the air, away from your face. Ask your health care provider or pharmacist if you have questions about priming your inhaler. 3. Shake the inhaler for 5 seconds before each breath in (inhalation). 4. Place the open end of the spacer onto the mouthpiece of the inhaler. 5. Position the inhaler so that the top of the canister faces up and the spacer mouthpiece faces you. 6. Put your index finger on the top of the medicine canister. Your thumb supports the bottom of the inhaler and the spacer. 7. Breathe out (exhale) normally and as completely as possible. 8. Immediately after exhaling, place the spacer between your teeth and into your mouth. Close your lips tightly around the spacer. 9. Press the canister down with your index finger to release the medicine. 10. At the same time as the canister is pressed, inhale deeply and slowly until your lungs are completely filled. This should take 4-6 seconds. Keep your tongue down and out of the way. 11. Hold the medicine in your lungs for 5-10 seconds (10 seconds is best). This helps the  medicine get into the small airways of your lungs. Exhale. 12. Repeat inhaling deeply through the spacer mouthpiece. Again hold that breath for up to 10 seconds (10 seconds is best). Exhale slowly. If it is difficult to take this second deep breath through the spacer, breathe normally several times through the spacer. Remove the spacer from your mouth. 13. Wait at least 15-30 seconds between puffs. Continue with the above steps until you have taken the number of puffs your health care provider has  ordered. Do not use the inhaler more than your health care provider tells you. 14. Remove the spacer from the inhaler, and place the cap on the inhaler. 15. Follow the directions from your health care provider or the inhaler insert for cleaning the inhaler and spacer. If you are using different kinds of inhalers, use your quick relief medicine to open the airways 10-15 minutes before using a steroid if instructed to do so by your health care provider. If you are unsure which inhalers to use and the order of using them, ask your health care provider, nurse, or respiratory therapist. If you are using a steroid inhaler, always rinse your mouth with water after your last puff, then gargle and spit out the water. Do not swallow the water. AVOID:  Inhaling before or after starting the spray of medicine. It takes practice to coordinate your breathing with triggering the spray.  Inhaling through the nose (rather than the mouth) when triggering the spray. HOW TO DETERMINE IF YOUR INHALER IS FULL OR NEARLY EMPTY You cannot know when an inhaler is empty by shaking it. A few inhalers are now being made with dose counters. Ask your health care provider for a prescription that has a dose counter if you feel you need that extra help. If your inhaler does not have a counter, ask your health care provider to help you determine the date you need to refill your inhaler. Write the refill date on a calendar or your inhaler canister. Refill your inhaler 7-10 days before it runs out. Be sure to keep an adequate supply of medicine. This includes making sure it is not expired, and that you have a spare inhaler.  SEEK MEDICAL CARE IF:   Your symptoms are only partially relieved with your inhaler.  You are having trouble using your inhaler.  You have some increase in phlegm. SEEK IMMEDIATE MEDICAL CARE IF:   You feel little or no relief with your inhalers. You are still wheezing and are feeling shortness of breath or  tightness in your chest or both.  You have dizziness, headaches, or a fast heart rate.  You have chills, fever, or night sweats.  You have a noticeable increase in phlegm production, or there is blood in the phlegm. MAKE SURE YOU:   Understand these instructions.  Will watch your condition.  Will get help right away if you are not doing well or get worse.   This information is not intended to replace advice given to you by your health care provider. Make sure you discuss any questions you have with your health care provider.   Document Released: 08/24/2000 Document Revised: 06/17/2013 Document Reviewed: 03/26/2013 Elsevier Interactive Patient Education [employer].

## 2015-11-28 NOTE — ED Provider Notes (Signed)
CSN: [id number]     Arrival date & time 11/27/15  2224 History   First MD Initiated Contact with Patient 11/27/15 2258     Chief Complaint  Patient presents with  . Shortness of Breath  . Cough     (Consider location/radiation/quality/duration/timing/severity/associated sxs/prior Treatment) HPI Comments: This assist-year-old female who has been treated for bronchitis for the past 3 weeks.  She's had a course of antibiotics, which she has completed, as well as Tessalon Perles and over-the-counter cough preparation.  She states the cough is now worse causing throat irritation and "wheezing" she feels that she needs an inhaler.  Patient is a 60 y.o. female presenting with shortness of breath and cough. The history is provided by the patient.  Shortness of Breath Severity:  Moderate Onset quality:  Gradual Timing:  Intermittent Progression:  Worsening Chronicity:  New Relieved by:  Nothing Worsened by:  Activity and coughing Ineffective treatments:  Sitting up Associated symptoms: cough and wheezing   Associated symptoms: no fever   Cough:    Cough characteristics:  Non-productive, dry and hacking Cough Associated symptoms: shortness of breath and wheezing   Associated symptoms: no chills and no fever     Past Medical History  Diagnosis Date  . Hypertension     stopped taking medications 10 years ago  . Stroke ([place])   . High cholesterol    Past Surgical History  Procedure Laterality Date  . Abdominal hysterectomy  1985   Family History  Problem Relation Age of Onset  . Cancer Father   . Heart attack Mother    Social History  Substance Use Topics  . Smoking status: Current Every Day Smoker -- 0.10 packs/day for 40 years    Types: Cigarettes  . Smokeless tobacco: Never Used  . Alcohol Use: Yes     Comment: on the weekends - moderately per pt   OB History    No data available     Review of Systems  Constitutional: Negative for fever and chills.  Respiratory:  Positive for cough, shortness of breath and wheezing.   All other systems reviewed and are negative.     Allergies  Review of patient's allergies indicates no known allergies.  Home Medications   Prior to Admission medications   Medication Sig Start Date End Date Taking? Authorizing Provider  aspirin 325 MG tablet Take 1 tablet (325 mg total) by mouth daily. 07/07/12  Yes Ivan Anchors Love, PA-C  azithromycin (ZITHROMAX) 250 MG tablet Take 1 tablet (250 mg total) by mouth daily. Take 1 every day until finished. Received her first dose in the ED. Take your first dose on Monday, 10/23/2005 10/24/15  Yes Hanna Patel-Mills, PA-C  fluticasone (FLONASE) 50 MCG/ACT nasal spray Place 1 spray into the nose daily as needed for rhinitis. For congestion. 07/07/12  Yes Ivan Anchors Love, PA-C  gabapentin (NEURONTIN) 300 MG capsule Take 1 capsule (300 mg total) by mouth 2 (two) times daily. For numbness left side Patient taking differently: Take 300-900 mg by mouth 2 (two) times daily. 300 mg in the morning 900 mg in the evening 07/07/12  Yes Ivan Anchors Love, PA-C  hydrALAZINE (APRESOLINE) 25 MG tablet Take 2 tablets (50 mg total) by mouth 3 (three) times daily. 08/04/12  Yes Meredith Staggers, MD  indomethacin (INDOCIN) 25 MG capsule Take 1 capsule (25 mg total) by mouth 3 (three) times daily as needed. Patient taking differently: Take 25 mg by mouth daily.  09/11/15  Yes Antonietta Breach,  PA-C  lisinopril (PRINIVIL) 10 MG tablet Take 1 tablet (10 mg total) by mouth daily. 08/04/12  Yes Meredith Staggers, MD  pravastatin (PRAVACHOL) 40 MG tablet Take 1 tablet (40 mg total) by mouth daily. For elevated cholesterol 07/07/12  Yes Pamela S Love, PA-C   BP 132/80 mmHg  Pulse 91  Temp(Src) 98.6 F (37 C) (Oral)  Resp 21  SpO2 100% Physical Exam  Constitutional: She appears well-developed and well-nourished.  HENT:  Head: Normocephalic.  Mouth/Throat: Uvula is midline. Uvula swelling present. Posterior oropharyngeal edema  present.  Eyes: Pupils are equal, round, and reactive to light.  Neck: Normal range of motion.  Pulmonary/Chest: She has no wheezes.  Lungs sounds are more stridorous than wheezing.  She will be given a DuoNeb as well as IV Decadron and reassessed  Abdominal: Soft.  Musculoskeletal: Normal range of motion.  Neurological: She is alert.  Skin: Skin is warm.  Nursing note and vitals reviewed.   ED Course  Procedures (including critical care time) Labs Review Labs Reviewed - No data to display  Imaging Review Dg Chest 2 View  11/28/2015  CLINICAL DATA:  Cough and dyspnea, 3 weeks duration. EXAM: CHEST  2 VIEW COMPARISON:  10/23/2015 FINDINGS: Near complete clearance of right middle lobe consolidation since 10/23/2015, with only minimal residual linear scarring or atelectasis visible on the lateral view. Left lung is clear. Pulmonary vasculature is normal. There is no pleural effusion. Hilar, mediastinal and cardiac contours are unremarkable. Mild cardiomegaly. IMPRESSION: Mild linear scarring or atelectasis persists in the right middle lobe, but there has been near complete clearance of the dense consolidation since 10/23/2015. Recommend continued radiographic follow-up in 6-12 weeks to confirm complete resolution. No conclusive acute findings. Unchanged mild cardiomegaly. Electronically Signed   By: Andreas Newport M.D.   On: 11/28/2015 00:07   I have personally reviewed and evaluated these images and lab results as part of my medical decision-making.   EKG Interpretation   Date/Time:  Sunday November 27 2015 22:50:22 EDT Ventricular Rate:  83 PR Interval:  126 QRS Duration: 80 QT Interval:  370 QTC Calculation: 435 R Axis:   36 Text Interpretation:  Sinus rhythm Atrial premature complex LAE, consider  biatrial enlargement Borderline T wave abnormalities Confirmed by  Rogene Houston  MD, SCOTT [phone]) on 11/27/2015 11:41:54 PM      MDM   Final diagnoses:  Bronchitis  Hoarseness of  voice         Junius Creamer, NP 11/28/15 [id number]  Fredia Sorrow, MD 11/28/15 [id number]

## 2016-06-05 ENCOUNTER — Emergency Department (HOSPITAL_COMMUNITY): Payer: Medicaid Other

## 2016-06-05 ENCOUNTER — Encounter (HOSPITAL_COMMUNITY): Payer: Self-pay | Admitting: Emergency Medicine

## 2016-06-05 ENCOUNTER — Emergency Department (HOSPITAL_COMMUNITY)
Admission: EM | Admit: 2016-06-05 | Discharge: 2016-06-06 | Discharge status: Against Medical Advice | Payer: Medicaid Other | Attending: Emergency Medicine | Admitting: Emergency Medicine

## 2016-06-05 DIAGNOSIS — J069 Acute upper respiratory infection, unspecified: Secondary | ICD-10-CM

## 2016-06-05 DIAGNOSIS — Z7982 Long term (current) use of aspirin: Secondary | ICD-10-CM | POA: Diagnosis not present

## 2016-06-05 DIAGNOSIS — I10 Essential (primary) hypertension: Secondary | ICD-10-CM | POA: Insufficient documentation

## 2016-06-05 DIAGNOSIS — Z8673 Personal history of transient ischemic attack (TIA), and cerebral infarction without residual deficits: Secondary | ICD-10-CM | POA: Insufficient documentation

## 2016-06-05 DIAGNOSIS — F17210 Nicotine dependence, cigarettes, uncomplicated: Secondary | ICD-10-CM | POA: Insufficient documentation

## 2016-06-05 DIAGNOSIS — R0981 Nasal congestion: Secondary | ICD-10-CM | POA: Diagnosis present

## 2016-06-05 DIAGNOSIS — R220 Localized swelling, mass and lump, head: Secondary | ICD-10-CM

## 2016-06-05 DIAGNOSIS — R931 Abnormal findings on diagnostic imaging of heart and coronary circulation: Secondary | ICD-10-CM | POA: Insufficient documentation

## 2016-06-05 DIAGNOSIS — R05 Cough: Secondary | ICD-10-CM

## 2016-06-05 DIAGNOSIS — H6692 Otitis media, unspecified, left ear: Secondary | ICD-10-CM | POA: Diagnosis not present

## 2016-06-05 DIAGNOSIS — R059 Cough, unspecified: Secondary | ICD-10-CM

## 2016-06-05 LAB — CBC WITH DIFFERENTIAL/PLATELET
BASOS ABS: 0 10*3/uL (ref 0.0–0.1)
BASOS PCT: 0 %
EOS ABS: 0.1 10*3/uL (ref 0.0–0.7)
EOS PCT: 2 %
HCT: 36.5 % (ref 36.0–46.0)
Hemoglobin: 11.3 g/dL — ABNORMAL LOW (ref 12.0–15.0)
Lymphocytes Relative: 30 %
Lymphs Abs: 1.5 10*3/uL (ref 0.7–4.0)
MCH: 24.9 pg — ABNORMAL LOW (ref 26.0–34.0)
MCHC: 31 g/dL (ref 30.0–36.0)
MCV: 80.6 fL (ref 78.0–100.0)
MONO ABS: 0.1 10*3/uL (ref 0.1–1.0)
Monocytes Relative: 1 %
Neutro Abs: 3.3 10*3/uL (ref 1.7–7.7)
Neutrophils Relative %: 67 %
PLATELETS: 425 10*3/uL — AB (ref 150–400)
RBC: 4.53 MIL/uL (ref 3.87–5.11)
RDW: 16.8 % — AB (ref 11.5–15.5)
WBC: 4.9 10*3/uL (ref 4.0–10.5)

## 2016-06-05 LAB — BASIC METABOLIC PANEL
Anion gap: 10 (ref 5–15)
BUN: 16 mg/dL (ref 6–20)
CALCIUM: 9.5 mg/dL (ref 8.9–10.3)
CO2: 22 mmol/L (ref 22–32)
Chloride: 104 mmol/L (ref 101–111)
Creatinine, Ser: 0.8 mg/dL (ref 0.44–1.00)
GFR calc Af Amer: >60 mL/min (ref >60)
GLUCOSE: 234 mg/dL — AB (ref 65–99)
Potassium: 3.7 mmol/L (ref 3.5–5.1)
Sodium: 136 mmol/L (ref 135–145)

## 2016-06-05 MED ORDER — DEXAMETHASONE SODIUM PHOSPHATE 10 MG/ML IJ SOLN
10.0000 mg | Freq: Four times a day (QID) | INTRAMUSCULAR | Status: DC
  Administered 2016-06-05: 10 mg via INTRAVENOUS
  Filled 2016-06-05: qty 1

## 2016-06-05 MED ORDER — DIPHENHYDRAMINE HCL 50 MG/ML IJ SOLN
25.0000 mg | Freq: Once | INTRAMUSCULAR | Status: AC
  Administered 2016-06-05: 25 mg via INTRAVENOUS
  Filled 2016-06-05: qty 1

## 2016-06-05 MED ORDER — IOPAMIDOL (ISOVUE-300) INJECTION 61%
INTRAVENOUS | Status: AC
  Administered 2016-06-06: 75 mL
  Filled 2016-06-05: qty 75

## 2016-06-05 MED ORDER — LORATADINE 10 MG PO TABS
10.0000 mg | ORAL_TABLET | Freq: Once | ORAL | Status: AC
  Administered 2016-06-05: 10 mg via ORAL
  Filled 2016-06-05: qty 1

## 2016-06-05 MED ORDER — OXYMETAZOLINE HCL 0.05 % NA SOLN
2.0000 | Freq: Once | NASAL | Status: AC
  Administered 2016-06-05: 2 via NASAL
  Filled 2016-06-05: qty 15

## 2016-06-05 MED ORDER — ALBUTEROL SULFATE (2.5 MG/3ML) 0.083% IN NEBU
5.0000 mg | INHALATION_SOLUTION | Freq: Once | RESPIRATORY_TRACT | Status: AC
  Administered 2016-06-05: 5 mg via RESPIRATORY_TRACT
  Filled 2016-06-05: qty 6

## 2016-06-05 NOTE — ED Provider Notes (Signed)
[facility] DEPT Provider Note   CSN: [id number] Arrival date & time: 06/05/16  1526     History   Chief Complaint Chief Complaint  Patient presents with  . Shortness of Breath  . Nasal Congestion  . Sore Throat    HPI Catherine Aguilar is a 60 y.o. female.  HPI Patient presents with URI symptoms for 1 month. Says she's had left greater than right ear pain, sore throat, nasal congestion, productive cough and increasing shortness of breath.'s states she believes that her face started swelling since being in the waiting room. No fever or chills. States she's been taking over-the-counter medication with little relief. Has not seen her primary physician. Past Medical History:  Diagnosis Date  . High cholesterol   . Hypertension    stopped taking medications 10 years ago  . Stroke [facility])     Patient Active Problem List   Diagnosis Date Noted  . CVA (cerebral vascular accident) ([place]) 07/03/2012  . Left facial numbness 07/02/2012  . Accelerated hypertension 07/01/2012    Past Surgical History:  Procedure Laterality Date  . ABDOMINAL HYSTERECTOMY  1985    OB History    No data available       Home Medications    Prior to Admission medications   Medication Sig Start Date End Date Taking? Authorizing Provider  aspirin 325 MG tablet Take 1 tablet (325 mg total) by mouth daily. 07/07/12  Yes Ivan Anchors Love, PA-C  gabapentin (NEURONTIN) 300 MG capsule Take 1 capsule (300 mg total) by mouth 2 (two) times daily. For numbness left side Patient taking differently: Take 600 mg by mouth 2 (two) times daily. 300 mg in the morning 900 mg in the evening 07/07/12  Yes Ivan Anchors Love, PA-C  hydrALAZINE (APRESOLINE) 25 MG tablet Take 2 tablets (50 mg total) by mouth 3 (three) times daily. 08/04/12  Yes Meredith Staggers, MD  indomethacin (INDOCIN) 25 MG capsule Take 1 capsule (25 mg total) by mouth 3 (three) times daily as needed. Patient taking differently: Take 25 mg by mouth 3 (three)  times daily with meals.  09/11/15  Yes Antonietta Breach, PA-C  lisinopril (PRINIVIL) 10 MG tablet Take 1 tablet (10 mg total) by mouth daily. 08/04/12  Yes Meredith Staggers, MD  losartan (COZAAR) 100 MG tablet Take 100 mg by mouth daily. 05/08/16  Yes Historical Provider, MD  pravastatin (PRAVACHOL) 40 MG tablet Take 1 tablet (40 mg total) by mouth daily. For elevated cholesterol 07/07/12  Yes Ivan Anchors Love, PA-C  azithromycin (ZITHROMAX) 250 MG tablet Take 1 tablet (250 mg total) by mouth daily. Take 1 every day until finished. Received her first dose in the ED. Take your first dose on Monday, 10/23/2005 Patient not taking: Reported on 06/05/2016 10/24/15   Ottie Glazier, PA-C  fluticasone (FLONASE) 50 MCG/ACT nasal spray Place 1 spray into the nose daily as needed for rhinitis. For congestion. Patient not taking: Reported on 06/05/2016 07/07/12   Bary Leriche, PA-C    Family History Family History  Problem Relation Age of Onset  . Cancer Father   . Heart attack Mother     Social History Social History  Substance Use Topics  . Smoking status: Current Every Day Smoker    Packs/day: 0.50    Years: 40.00    Types: Cigarettes  . Smokeless tobacco: Never Used  . Alcohol use Yes     Comment: on the weekends - moderately per pt     Allergies  Review of patient's allergies indicates no known allergies.   Review of Systems Review of Systems  Constitutional: Negative for chills and fever.  HENT: Positive for congestion, ear pain, facial swelling, sinus pressure and sore throat. Negative for trouble swallowing and voice change.   Respiratory: Positive for cough and shortness of breath. Negative for wheezing.   Cardiovascular: Negative for chest pain, palpitations and leg swelling.  Gastrointestinal: Negative for abdominal pain, diarrhea, nausea and vomiting.  Genitourinary: Negative for dysuria, flank pain and frequency.  Musculoskeletal: Positive for myalgias. Negative for back pain, neck  pain and neck stiffness.  Skin: Negative for rash and wound.  Neurological: Negative for dizziness, syncope, weakness, light-headedness, numbness and headaches.  All other systems reviewed and are negative.    Physical Exam Updated Vital Signs BP (!) 217/81   Pulse 63   Temp 98.7 F (37.1 C)   Resp 14   Ht 5\' 9"  (1.753 m)   Wt 223 lb (101.2 kg)   SpO2 97%   BMI 32.93 kg/m   Physical Exam  Constitutional: She is oriented to person, place, and time. She appears well-developed and well-nourished. No distress.  HENT:  Head: Normocephalic and atraumatic.  Mouth/Throat: Oropharynx is clear and moist. No oropharyngeal exudate.  Right greater than left periorbital edema. Oropharynx is erythematous. Bulging left TM with erythema. No sinus tenderness with percussion. Nasal mucosal edema.  Eyes: EOM are normal. Pupils are equal, round, and reactive to light.  Neck: Normal range of motion. Neck supple. No JVD present.  No meningismus  Cardiovascular: Normal rate and regular rhythm.   Pulmonary/Chest: Effort normal and breath sounds normal. No stridor.  Few scattered rhonchi. No wheezing appreciated.  Abdominal: Soft. Bowel sounds are normal. There is no tenderness. There is no rebound and no guarding.  Musculoskeletal: Normal range of motion. She exhibits no edema or tenderness.  Lymphadenopathy:    She has no cervical adenopathy.  Neurological: She is alert and oriented to person, place, and time.  Moves all extremities without deficit. Sensation fully intact.  Skin: Skin is warm and dry. No rash noted. No erythema.  Psychiatric: She has a normal mood and affect. Her behavior is normal.  Nursing note and vitals reviewed.    ED Treatments / Results  Labs (all labs ordered are listed, but only abnormal results are displayed) Labs Reviewed  BASIC METABOLIC PANEL - Abnormal; Notable for the following:       Result Value   Glucose, Bld 234 (*)    All other components within normal  limits  CBC WITH DIFFERENTIAL/PLATELET - Abnormal; Notable for the following:    Hemoglobin 11.3 (*)    MCH 24.9 (*)    RDW 16.8 (*)    Platelets 425 (*)    All other components within normal limits    EKG  EKG Interpretation  Date/Time:  Tuesday June 05 2016 21:57:25 EDT Ventricular Rate:  76 PR Interval:  124 QRS Duration: 84 QT Interval:  393 QTC Calculation: 442 R Axis:   71 Text Interpretation:  Sinus rhythm Right atrial enlargement Confirmed by Lita Mains  MD, Hudsyn Champine ([id number]) on 06/05/2016 10:58:05 PM       Radiology No results found.  Procedures Procedures (including critical care time)  Medications Ordered in ED Medications  albuterol (PROVENTIL) (2.5 MG/3ML) 0.083% nebulizer solution 5 mg (5 mg Nebulization Given 06/05/16 1553)  loratadine (CLARITIN) tablet 10 mg (10 mg Oral Given 06/05/16 2241)  diphenhydrAMINE (BENADRYL) injection 25 mg (25 mg Intravenous Given  06/05/16 2249)  oxymetazoline (AFRIN) 0.05 % nasal spray 2 spray (2 sprays Each Nare Given 06/05/16 2241)  iopamidol (ISOVUE-300) 61 % injection (75 mLs  Contrast Given 06/06/16 0002)     Initial Impression / Assessment and Plan / ED Course  I have reviewed the triage vital signs and the nursing notes.  Pertinent labs & imaging results that were available during my care of the patient were reviewed by me and considered in my medical decision making (see chart for details).  Clinical Course  Signed out to oncoming emergency physician pending CT of the chest.    Final Clinical Impressions(s) / ED Diagnoses   Final diagnoses:  Acute left otitis media, recurrence not specified, unspecified otitis media type  URI (upper respiratory infection)  Facial swelling  Cough    New Prescriptions Discharge Medication List as of 06/06/2016  2:53 AM       Julianne Rice, MD 06/18/16 1342

## 2016-06-05 NOTE — ED Triage Notes (Signed)
Pt here with cough, sore throat, nasal congestion, URI symptoms x `1 month. Pt also reports SOB and jaw pain. Unknown if hse has had fevers. Pt speaking in complete sentences, breathing easily.

## 2016-06-06 ENCOUNTER — Encounter (HOSPITAL_COMMUNITY): Payer: Self-pay | Admitting: Radiology

## 2016-06-06 ENCOUNTER — Emergency Department (HOSPITAL_COMMUNITY): Payer: Medicaid Other

## 2016-06-06 NOTE — ED Provider Notes (Signed)
I assumed care in signout to f/u on CT chest Patient left before CT chest resulted and I was not told patient left the ER CT chest recommends PET/CT scan Pt was not available to receive this info I have called and left messages on phone numbers listed    Ripley Fraise, MD 06/06/16 [phone]

## 2016-06-06 NOTE — ED Notes (Signed)
Pt leaviing AMA tried to leave with IV, Noah Delaine, RN removed IV

## 2016-06-06 NOTE — ED Notes (Signed)
Pt attempted to walk out of ER with IV still placed in her arm. Pt states "I'm going, I've been here too long. Tests have been back". Pt stopped and IV removed, pressure held to [facility] and gauze/tape applied. Pt left, refusing to sign paperwork prior to walking out of ER.

## 2016-06-06 NOTE — ED Notes (Signed)
Pt requesting results and wanting to know the plan of care.  CT results still pending

## 2016-06-06 NOTE — ED Notes (Signed)
Pt removed all monitoring devices at this time.  MD notified

## 2016-06-28 ENCOUNTER — Other Ambulatory Visit: Payer: Self-pay | Admitting: Family Medicine

## 2016-06-28 DIAGNOSIS — R9389 Abnormal findings on diagnostic imaging of other specified body structures: Secondary | ICD-10-CM

## 2016-07-02 ENCOUNTER — Ambulatory Visit
Admission: RE | Admit: 2016-07-02 | Discharge: 2016-07-02 | Disposition: A | Discharge status: Home / Self Care | Payer: Medicaid Other | Source: Ambulatory Visit | Attending: Family Medicine | Admitting: Family Medicine

## 2016-07-02 DIAGNOSIS — R9389 Abnormal findings on diagnostic imaging of other specified body structures: Secondary | ICD-10-CM

## 2016-10-10 ENCOUNTER — Emergency Department (HOSPITAL_COMMUNITY)
Admission: EM | Admit: 2016-10-10 | Discharge: 2016-10-10 | Disposition: A | Discharge status: Home / Self Care | Payer: Medicaid Other | Attending: Emergency Medicine | Admitting: Emergency Medicine

## 2016-10-10 DIAGNOSIS — Z7982 Long term (current) use of aspirin: Secondary | ICD-10-CM | POA: Insufficient documentation

## 2016-10-10 DIAGNOSIS — F17210 Nicotine dependence, cigarettes, uncomplicated: Secondary | ICD-10-CM | POA: Diagnosis not present

## 2016-10-10 DIAGNOSIS — R040 Epistaxis: Secondary | ICD-10-CM

## 2016-10-10 DIAGNOSIS — Z79899 Other long term (current) drug therapy: Secondary | ICD-10-CM | POA: Diagnosis not present

## 2016-10-10 DIAGNOSIS — Z7984 Long term (current) use of oral hypoglycemic drugs: Secondary | ICD-10-CM | POA: Diagnosis not present

## 2016-10-10 DIAGNOSIS — Z8673 Personal history of transient ischemic attack (TIA), and cerebral infarction without residual deficits: Secondary | ICD-10-CM | POA: Diagnosis not present

## 2016-10-10 DIAGNOSIS — J320 Chronic maxillary sinusitis: Secondary | ICD-10-CM | POA: Diagnosis not present

## 2016-10-10 DIAGNOSIS — I10 Essential (primary) hypertension: Secondary | ICD-10-CM | POA: Diagnosis not present

## 2016-10-10 MED ORDER — FLUTICASONE PROPIONATE 50 MCG/ACT NA SUSP: 2.0000 | Freq: Every day | NASAL | 2 refills | Status: DC

## 2016-10-10 MED ORDER — OXYMETAZOLINE HCL 0.05 % NA SOLN
2.0000 | Freq: Once | NASAL | Status: AC
  Administered 2016-10-10: 2 via NASAL
  Filled 2016-10-10: qty 15

## 2016-10-10 MED ORDER — SILVER NITRATE-POT NITRATE 75-25 % EX MISC
2.0000 | Freq: Once | CUTANEOUS | Status: AC
  Administered 2016-10-10: 2 via TOPICAL
  Filled 2016-10-10: qty 2

## 2016-10-10 MED ORDER — MUPIROCIN CALCIUM 2 % NA OINT: TOPICAL_OINTMENT | NASAL | 0 refills | Status: DC

## 2016-10-10 NOTE — ED Notes (Signed)
Pt currently on phone, unable to initiate triage at this time

## 2016-10-10 NOTE — Discharge Instructions (Signed)
If your nose starts to bleed again,  1.) Blow your nose, each nostril, to remove clots 2.) Apply direct pressure to your nose, as I showed you, for 5 minutes straight 3.) If your nose continues to bleed, blow it one more time, then apply the Afrin/Oxymetazoline spray to each nostril  4.) Hold pressure again for 5 minutes 5.) If bleeding stops, do NOT blow your nose again; if it continues, seek emergency care

## 2016-10-10 NOTE — ED Provider Notes (Signed)
[facility] DEPT Provider Note   CSN: [id number] Arrival date & time: 10/10/16  1608     History   Chief Complaint Chief Complaint  Patient presents with  . Epistaxis    HPI Catherine Aguilar is a 61 y.o. female.  HPI 61 year old female with history as below who presents with intermittent epistaxis. The patient states that since early December 2017, she has had intermittent bilateral nosebleeds. She has had a sinus infection as well and is currently completing a course of antibiotics. She states that earlier today, she began to have bright red bleeding from both of her nostrils followed by passage of small clots. She had associated mild shortness of breath, though she states she was also anxious at the time. She did not apply any direct pressure and the nosebleed resolved spontaneously. This is similar to her previous episodes. She has not seen an ENT specialist for this. She does sleep very close to a heater and continues to smoke cigarettes. Denies any direct trauma to the nose. No shortness of breath currently.  Past Medical History:  Diagnosis Date  . High cholesterol   . Hypertension    stopped taking medications 10 years ago  . Stroke [facility])     Patient Active Problem List   Diagnosis Date Noted  . CVA (cerebral vascular accident) ([place]) 07/03/2012  . Left facial numbness 07/02/2012  . Accelerated hypertension 07/01/2012    Past Surgical History:  Procedure Laterality Date  . ABDOMINAL HYSTERECTOMY  1985    OB History    No data available       Home Medications    Prior to Admission medications   Medication Sig Start Date End Date Taking? Authorizing Provider  aspirin 325 MG tablet Take 1 tablet (325 mg total) by mouth daily. 07/07/12  Yes Ivan Anchors Love, PA-C  gabapentin (NEURONTIN) 300 MG capsule Take 1 capsule (300 mg total) by mouth 2 (two) times daily. For numbness left side Patient taking differently: Take 600 mg by mouth 2 (two) times daily. 300 mg in  the morning 900 mg in the evening 07/07/12  Yes Ivan Anchors Love, PA-C  hydrALAZINE (APRESOLINE) 25 MG tablet Take 2 tablets (50 mg total) by mouth 3 (three) times daily. 08/04/12  Yes Meredith Staggers, MD  indomethacin (INDOCIN) 25 MG capsule Take 1 capsule (25 mg total) by mouth 3 (three) times daily as needed. Patient taking differently: Take 25 mg by mouth 3 (three) times daily with meals.  09/11/15  Yes Kelly Humes, PA-C  LINZESS 290 MCG CAPS capsule TAKE 1 CAPSULE BY MOUTH BEFORE FIRST MEAL EVERY DAY 08/24/16  Yes Historical Provider, MD  lisinopril (PRINIVIL) 10 MG tablet Take 1 tablet (10 mg total) by mouth daily. 08/04/12  Yes Meredith Staggers, MD  losartan (COZAAR) 100 MG tablet Take 100 mg by mouth daily. 05/08/16  Yes Historical Provider, MD  metFORMIN (GLUCOPHAGE) 500 MG tablet TAKE ONE TABLET BY MOUTH AT BREAKFAST AND ONE TABLET BY MOUTH AT SUPPER 09/01/16  Yes Historical Provider, MD  pravastatin (PRAVACHOL) 40 MG tablet Take 1 tablet (40 mg total) by mouth daily. For elevated cholesterol 07/07/12  Yes Ivan Anchors Love, PA-C  PROVENTIL HFA 108 (90 Base) MCG/ACT inhaler INHALE 2 PUFFS EVERY 4-6 HOURS PRN FOR SOB AND WHEEZING 09/01/16  Yes Historical Provider, MD  azithromycin (ZITHROMAX) 250 MG tablet Take 1 tablet (250 mg total) by mouth daily. Take 1 every day until finished. Received her first dose in the ED. Take your  first dose on Monday, 10/23/2005 Patient not taking: Reported on 06/05/2016 10/24/15   Ottie Glazier, PA-C  fluticasone (FLONASE) 50 MCG/ACT nasal spray Place 2 sprays into both nostrils daily. 10/10/16 10/24/16  Duffy Bruce, MD  loratadine (CLARITIN) 10 MG tablet TAKE ONE TABLET BY MOUTH EVERY DAY FOR NASAL CONGESTION 09/19/16   Historical Provider, MD  mupirocin nasal ointment (BACTROBAN) 2 % Apply in each nostril daily for 10 days 10/10/16   Duffy Bruce, MD    Family History Family History  Problem Relation Age of Onset  . Cancer Father   . Heart attack Mother      Social History Social History  Substance Use Topics  . Smoking status: Current Every Day Smoker    Packs/day: 0.50    Years: 40.00    Types: Cigarettes  . Smokeless tobacco: Never Used  . Alcohol use Yes     Comment: on the weekends - moderately per pt     Allergies   Patient has no known allergies.   Review of Systems Review of Systems  Constitutional: Positive for fatigue. Negative for chills and fever.  HENT: Positive for congestion, nosebleeds, rhinorrhea and sinus pressure. Negative for trouble swallowing and voice change.   Eyes: Negative for visual disturbance.  Respiratory: Negative for cough, shortness of breath and wheezing.   Cardiovascular: Negative for chest pain and leg swelling.  Gastrointestinal: Negative for abdominal pain, diarrhea, nausea and vomiting.  Genitourinary: Negative for dysuria and flank pain.  Musculoskeletal: Negative for neck pain and neck stiffness.  Skin: Negative for rash and wound.  Allergic/Immunologic: Negative for immunocompromised state.  Neurological: Negative for syncope, weakness and headaches.  All other systems reviewed and are negative.    Physical Exam Updated Vital Signs BP 135/82 (BP Location: Right Arm)   Pulse 76   Temp 98 F (36.7 C) (Oral)   Resp 18   SpO2 97%   Physical Exam  Constitutional: She is oriented to person, place, and time. She appears well-developed and well-nourished. No distress.  HENT:  Head: Normocephalic and atraumatic.  Right Ear: Tympanic membrane is not erythematous. A middle ear effusion is present.  Left Ear: Tympanic membrane is not erythematous. A middle ear effusion is present.  Nose: Mucosal edema, rhinorrhea and septal deviation present. Right sinus exhibits maxillary sinus tenderness and frontal sinus tenderness. Left sinus exhibits maxillary sinus tenderness and frontal sinus tenderness.  Mouth/Throat: Uvula is midline, oropharynx is clear and moist and mucous membranes are  normal. No oropharyngeal exudate.  Superficial capillary beds inflamed in bilateral anterior nares (Kiesselbach's plexus). No active bleeding.  Eyes: Conjunctivae are normal. Pupils are equal, round, and reactive to light.  Neck: Neck supple.  Cardiovascular: Normal rate, regular rhythm and normal heart sounds.  Exam reveals no friction rub.   No murmur heard. Pulmonary/Chest: Effort normal and breath sounds normal. No respiratory distress. She has no wheezes. She has no rales.  Abdominal: She exhibits no distension.  Musculoskeletal: She exhibits no edema.  Neurological: She is alert and oriented to person, place, and time. She exhibits normal muscle tone.  Skin: Skin is warm. Capillary refill takes less than 2 seconds.  Psychiatric: She has a normal mood and affect.  Nursing note and vitals reviewed.    ED Treatments / Results  Labs (all labs ordered are listed, but only abnormal results are displayed) Labs Reviewed - No data to display  EKG  EKG Interpretation None       Radiology No results found.  Procedures Procedures (including critical care time)  Medications Ordered in ED Medications  silver nitrate applicators applicator 2 Stick (2 Sticks Topical Given 10/10/16 1803)  oxymetazoline (AFRIN) 0.05 % nasal spray 2 spray (2 sprays Each Nare Given 10/10/16 1803)     Initial Impression / Assessment and Plan / ED Course  I have reviewed the triage vital signs and the nursing notes.  Pertinent labs & imaging results that were available during my care of the patient were reviewed by me and considered in my medical decision making (see chart for details).     61 year old female not on blood thinners who presents with mild, transient, bilateral epistaxis. Exam is consistent with diffuse sinusitis and sinus mucosal disease with superficial capillary beds exposed on the right greater than left. She has no evidence of active bleeding at this time. No signs of posterior  nosebleed. She is not on blood thinners and has no symptoms to suggest symptomatically anemia. Given the exposed capillary bed on the right, the the capillary bed was cauterized with silver nitrate. Will start the patient on Flonase, [place] nasal spray, and instructed patient on appropriate management of epistaxis.  Final Clinical Impressions(s) / ED Diagnoses   Final diagnoses:  Epistaxis  Maxillary sinusitis, unspecified chronicity    New Prescriptions Discharge Medication List as of 10/10/2016  5:55 PM    START taking these medications   Details  mupirocin nasal ointment (BACTROBAN) 2 % Apply in each nostril daily for 10 days, Print         Duffy Bruce, MD 10/11/16 [phone]

## 2016-10-10 NOTE — ED Triage Notes (Signed)
Pt comes from home via EMS with complaints of a nose bleed that started this morning.  Reports it being a slow drop off and on all day. Denies oxygen use. Hypertensive in route with hx.  States she has been compliant with medication. 150/100 BP in route. Other vitals WNL. A&O x4.  Ambulatory.

## 2016-10-10 NOTE — ED Notes (Signed)
Bed: WA01 Expected date:  Expected time:  Means of arrival:  Comments: 61 yo nosebleed

## 2016-10-22 ENCOUNTER — Encounter (HOSPITAL_COMMUNITY): Payer: Self-pay | Admitting: Emergency Medicine

## 2016-10-22 ENCOUNTER — Emergency Department (HOSPITAL_COMMUNITY)
Admission: EM | Admit: 2016-10-22 | Discharge: 2016-10-22 | Disposition: A | Discharge status: Home / Self Care | Payer: Medicaid Other | Attending: Emergency Medicine | Admitting: Emergency Medicine

## 2016-10-22 DIAGNOSIS — Z7984 Long term (current) use of oral hypoglycemic drugs: Secondary | ICD-10-CM | POA: Insufficient documentation

## 2016-10-22 DIAGNOSIS — R040 Epistaxis: Secondary | ICD-10-CM | POA: Diagnosis present

## 2016-10-22 DIAGNOSIS — I10 Essential (primary) hypertension: Secondary | ICD-10-CM | POA: Insufficient documentation

## 2016-10-22 DIAGNOSIS — F17210 Nicotine dependence, cigarettes, uncomplicated: Secondary | ICD-10-CM | POA: Diagnosis not present

## 2016-10-22 DIAGNOSIS — Z79899 Other long term (current) drug therapy: Secondary | ICD-10-CM | POA: Insufficient documentation

## 2016-10-22 DIAGNOSIS — Z7982 Long term (current) use of aspirin: Secondary | ICD-10-CM | POA: Diagnosis not present

## 2016-10-22 DIAGNOSIS — Z8673 Personal history of transient ischemic attack (TIA), and cerebral infarction without residual deficits: Secondary | ICD-10-CM | POA: Diagnosis not present

## 2016-10-22 MED ORDER — TRANEXAMIC ACID 1000 MG/10ML IV SOLN
500.0000 mg | Freq: Once | INTRAVENOUS | Status: AC
  Administered 2016-10-22: 500 mg via TOPICAL
  Filled 2016-10-22: qty 10

## 2016-10-22 NOTE — ED Provider Notes (Signed)
[facility] DEPT Provider Note   CSN: [id number] Arrival date & time: 10/22/16  0425     History   Chief Complaint Chief Complaint  Patient presents with  . Epistaxis    HPI Catherine Aguilar is a 61 y.o. female.  Patient presents to the ER for recurrent nosebleed. Patient reports that she has been having intermittent bleeding from the right side of her nose since she was seen in the ER recently. She also followed up with ENT, Dr. Constance Holster last week as well. Patient denies any direct injury. She does report that she has had some mild headaches. Blood pressure noted to be slightly elevated by EMS. She has a history of hypertension, not currently taking any medications.      Past Medical History:  Diagnosis Date  . High cholesterol   . Hypertension    stopped taking medications 10 years ago  . Stroke [facility])     Patient Active Problem List   Diagnosis Date Noted  . CVA (cerebral vascular accident) ([place]) 07/03/2012  . Left facial numbness 07/02/2012  . Accelerated hypertension 07/01/2012    Past Surgical History:  Procedure Laterality Date  . ABDOMINAL HYSTERECTOMY  1985    OB History    No data available       Home Medications    Prior to Admission medications   Medication Sig Start Date End Date Taking? Authorizing Provider  aspirin 325 MG tablet Take 1 tablet (325 mg total) by mouth daily. 07/07/12   Ivan Anchors Love, PA-C  azithromycin (ZITHROMAX) 250 MG tablet Take 1 tablet (250 mg total) by mouth daily. Take 1 every day until finished. Received her first dose in the ED. Take your first dose on Monday, 10/23/2005 Patient not taking: Reported on 06/05/2016 10/24/15   Ottie Glazier, PA-C  fluticasone (FLONASE) 50 MCG/ACT nasal spray Place 2 sprays into both nostrils daily. 10/10/16 10/24/16  Duffy Bruce, MD  gabapentin (NEURONTIN) 300 MG capsule Take 1 capsule (300 mg total) by mouth 2 (two) times daily. For numbness left side Patient taking differently: Take  600 mg by mouth 2 (two) times daily. 300 mg in the morning 900 mg in the evening 07/07/12   Ivan Anchors Love, PA-C  hydrALAZINE (APRESOLINE) 25 MG tablet Take 2 tablets (50 mg total) by mouth 3 (three) times daily. 08/04/12   Meredith Staggers, MD  indomethacin (INDOCIN) 25 MG capsule Take 1 capsule (25 mg total) by mouth 3 (three) times daily as needed. Patient taking differently: Take 25 mg by mouth 3 (three) times daily with meals.  09/11/15   Antonietta Breach, PA-C  LINZESS 290 MCG CAPS capsule TAKE 1 CAPSULE BY MOUTH BEFORE FIRST MEAL EVERY DAY 08/24/16   Historical Provider, MD  lisinopril (PRINIVIL) 10 MG tablet Take 1 tablet (10 mg total) by mouth daily. 08/04/12   Meredith Staggers, MD  loratadine (CLARITIN) 10 MG tablet TAKE ONE TABLET BY MOUTH EVERY DAY FOR NASAL CONGESTION 09/19/16   Historical Provider, MD  losartan (COZAAR) 100 MG tablet Take 100 mg by mouth daily. 05/08/16   Historical Provider, MD  metFORMIN (GLUCOPHAGE) 500 MG tablet TAKE ONE TABLET BY MOUTH AT BREAKFAST AND ONE TABLET BY MOUTH AT SUPPER 09/01/16   Historical Provider, MD  mupirocin nasal ointment (BACTROBAN) 2 % Apply in each nostril daily for 10 days 10/10/16   Duffy Bruce, MD  pravastatin (PRAVACHOL) 40 MG tablet Take 1 tablet (40 mg total) by mouth daily. For elevated cholesterol 07/07/12   Olin Hauser  S Love, PA-C  PROVENTIL HFA 108 (90 Base) MCG/ACT inhaler INHALE 2 PUFFS EVERY 4-6 HOURS PRN FOR SOB AND WHEEZING 09/01/16   Historical Provider, MD    Family History Family History  Problem Relation Age of Onset  . Cancer Father   . Heart attack Mother     Social History Social History  Substance Use Topics  . Smoking status: Current Every Day Smoker    Packs/day: 0.50    Years: 40.00    Types: Cigarettes  . Smokeless tobacco: Never Used  . Alcohol use Yes     Comment: on the weekends - moderately per pt     Allergies   Patient has no known allergies.   Review of Systems Review of Systems  HENT: Positive  for nosebleeds.      Physical Exam Updated Vital Signs BP (!) 156/101 (BP Location: Left Arm)   Pulse 82   Temp 98.3 F (36.8 C) (Oral)   Resp 22   SpO2 96%   Physical Exam  Constitutional: She is oriented to person, place, and time. She appears well-developed and well-nourished. No distress.  HENT:  Head: Normocephalic and atraumatic.  Right Ear: Hearing normal.  Left Ear: Hearing normal.  Nose: Nose normal.  Mouth/Throat: Oropharynx is clear and moist and mucous membranes are normal.  Dilated vessel without active bleeding right anterior nasal septum  Eyes: Conjunctivae and EOM are normal. Pupils are equal, round, and reactive to light.  Neck: Normal range of motion. Neck supple.  Cardiovascular: Regular rhythm, S1 normal and S2 normal.  Exam reveals no gallop and no friction rub.   No murmur heard. Pulmonary/Chest: Effort normal and breath sounds normal. No respiratory distress. She exhibits no tenderness.  Abdominal: Soft. Normal appearance and bowel sounds are normal. There is no hepatosplenomegaly. There is no tenderness. There is no rebound, no guarding, no tenderness at McBurney's point and negative Murphy's sign. No hernia.  Musculoskeletal: Normal range of motion.  Neurological: She is alert and oriented to person, place, and time. She has normal strength. No cranial nerve deficit or sensory deficit. Coordination normal. GCS eye subscore is 4. GCS verbal subscore is 5. GCS motor subscore is 6.  Skin: Skin is warm, dry and intact. No rash noted. No cyanosis.  Psychiatric: She has a normal mood and affect. Her speech is normal and behavior is normal. Thought content normal.  Nursing note and vitals reviewed.    ED Treatments / Results  Labs (all labs ordered are listed, but only abnormal results are displayed) Labs Reviewed - No data to display  EKG  EKG Interpretation None       Radiology No results found.  Procedures Procedures (including critical care  time)  Medications Ordered in ED Medications  tranexamic acid (CYKLOKAPRON) injection 500 mg (500 mg Topical Given 10/22/16 0443)     Initial Impression / Assessment and Plan / ED Course  I have reviewed the triage vital signs and the nursing notes.  Pertinent labs & imaging results that were available during my care of the patient were reviewed by me and considered in my medical decision making (see chart for details).     Patient presented to the emergency part for evaluation of nosebleed. Patient has been seen in the ER previously and has also follow-up with ENT, Dr. Constance Holster. Patient previously had silver nitrate cauterization of the nose. At arrival to the ER she is not actively bleeding. There is obvious sequela of bleeding from vessels on the  anterior nasal septum on the right side. Patient was treated with topical tranexamic acid. She has been monitored, no further bleeding. She will need to follow-up with Dr. Constance Holster, continue topical saline spray.  Final Clinical Impressions(s) / ED Diagnoses   Final diagnoses:  Epistaxis    New Prescriptions New Prescriptions   No medications on file     Orpah Greek, MD 10/22/16 [phone]

## 2016-10-22 NOTE — ED Notes (Signed)
Bleeding well controlled. Pt states she doesn't feel blood in her throat and has stopped coughing up.  Denies pain.  Md aware

## 2016-10-22 NOTE — ED Notes (Signed)
Bed: YE33 Expected date:  Expected time:  Means of arrival:  Comments: 41f Nose bleed x 4 days with HTN

## 2016-10-22 NOTE — ED Triage Notes (Signed)
Pt from home via EMS for nosebleed x4 days intermittent, woke up around 0330 feeling it draining down throat.  Reports coughing up some blood.  PCP & ENT (referred by PCP) states that it's just the weather.  Pt is concerned d/t hx of stroke, hx chronic bronchitis & DM.  A&Ox4.  Bleeding controlled before EMS arrival.  Reports chronic HA.  Hypertensive per EMS, 169/85. VS: 97% RA, 84 bpm, 20 RR

## 2016-11-30 ENCOUNTER — Other Ambulatory Visit: Payer: Self-pay | Admitting: Family Medicine

## 2016-11-30 DIAGNOSIS — R911 Solitary pulmonary nodule: Secondary | ICD-10-CM

## 2016-12-07 ENCOUNTER — Inpatient Hospital Stay (HOSPITAL_COMMUNITY)
Admission: EM | Admit: 2016-12-07 | Discharge: 2016-12-16 | DRG: 299 | Disposition: A | Discharge status: Home / Self Care | Payer: Medicaid Other | Attending: Family Medicine | Admitting: Family Medicine

## 2016-12-07 ENCOUNTER — Encounter (HOSPITAL_COMMUNITY): Payer: Self-pay | Admitting: Emergency Medicine

## 2016-12-07 ENCOUNTER — Emergency Department (HOSPITAL_COMMUNITY): Payer: Medicaid Other

## 2016-12-07 DIAGNOSIS — J9690 Respiratory failure, unspecified, unspecified whether with hypoxia or hypercapnia: Secondary | ICD-10-CM

## 2016-12-07 DIAGNOSIS — Z7982 Long term (current) use of aspirin: Secondary | ICD-10-CM

## 2016-12-07 DIAGNOSIS — M3130 Wegener's granulomatosis without renal involvement: Secondary | ICD-10-CM | POA: Diagnosis present

## 2016-12-07 DIAGNOSIS — N179 Acute kidney failure, unspecified: Secondary | ICD-10-CM | POA: Diagnosis present

## 2016-12-07 DIAGNOSIS — I776 Arteritis, unspecified: Secondary | ICD-10-CM

## 2016-12-07 DIAGNOSIS — F17210 Nicotine dependence, cigarettes, uncomplicated: Secondary | ICD-10-CM | POA: Diagnosis present

## 2016-12-07 DIAGNOSIS — Z79899 Other long term (current) drug therapy: Secondary | ICD-10-CM

## 2016-12-07 DIAGNOSIS — Z0000 Encounter for general adult medical examination without abnormal findings: Secondary | ICD-10-CM

## 2016-12-07 DIAGNOSIS — R0489 Hemorrhage from other sites in respiratory passages: Secondary | ICD-10-CM

## 2016-12-07 DIAGNOSIS — E876 Hypokalemia: Secondary | ICD-10-CM | POA: Diagnosis present

## 2016-12-07 DIAGNOSIS — D631 Anemia in chronic kidney disease: Secondary | ICD-10-CM | POA: Diagnosis present

## 2016-12-07 DIAGNOSIS — I10 Essential (primary) hypertension: Secondary | ICD-10-CM | POA: Diagnosis present

## 2016-12-07 DIAGNOSIS — R51 Headache: Secondary | ICD-10-CM

## 2016-12-07 DIAGNOSIS — Z720 Tobacco use: Secondary | ICD-10-CM | POA: Diagnosis present

## 2016-12-07 DIAGNOSIS — A419 Sepsis, unspecified organism: Secondary | ICD-10-CM | POA: Diagnosis present

## 2016-12-07 DIAGNOSIS — R140 Abdominal distension (gaseous): Secondary | ICD-10-CM

## 2016-12-07 DIAGNOSIS — Z7984 Long term (current) use of oral hypoglycemic drugs: Secondary | ICD-10-CM

## 2016-12-07 DIAGNOSIS — F1010 Alcohol abuse, uncomplicated: Secondary | ICD-10-CM | POA: Diagnosis present

## 2016-12-07 DIAGNOSIS — M3131 Wegener's granulomatosis with renal involvement: Secondary | ICD-10-CM | POA: Diagnosis present

## 2016-12-07 DIAGNOSIS — F1910 Other psychoactive substance abuse, uncomplicated: Secondary | ICD-10-CM

## 2016-12-07 DIAGNOSIS — E669 Obesity, unspecified: Secondary | ICD-10-CM | POA: Diagnosis present

## 2016-12-07 DIAGNOSIS — Z6831 Body mass index (BMI) 31.0-31.9, adult: Secondary | ICD-10-CM

## 2016-12-07 DIAGNOSIS — R042 Hemoptysis: Secondary | ICD-10-CM | POA: Diagnosis present

## 2016-12-07 DIAGNOSIS — Z8673 Personal history of transient ischemic attack (TIA), and cerebral infarction without residual deficits: Secondary | ICD-10-CM

## 2016-12-07 DIAGNOSIS — J42 Unspecified chronic bronchitis: Secondary | ICD-10-CM | POA: Diagnosis present

## 2016-12-07 DIAGNOSIS — Z8249 Family history of ischemic heart disease and other diseases of the circulatory system: Secondary | ICD-10-CM

## 2016-12-07 DIAGNOSIS — I639 Cerebral infarction, unspecified: Secondary | ICD-10-CM | POA: Diagnosis present

## 2016-12-07 DIAGNOSIS — J9601 Acute respiratory failure with hypoxia: Secondary | ICD-10-CM | POA: Diagnosis present

## 2016-12-07 DIAGNOSIS — E785 Hyperlipidemia, unspecified: Secondary | ICD-10-CM | POA: Diagnosis present

## 2016-12-07 DIAGNOSIS — J189 Pneumonia, unspecified organism: Secondary | ICD-10-CM | POA: Diagnosis present

## 2016-12-07 DIAGNOSIS — M318 Other specified necrotizing vasculopathies: Principal | ICD-10-CM

## 2016-12-07 DIAGNOSIS — D649 Anemia, unspecified: Secondary | ICD-10-CM | POA: Diagnosis present

## 2016-12-07 DIAGNOSIS — R519 Headache, unspecified: Secondary | ICD-10-CM

## 2016-12-07 DIAGNOSIS — E7800 Pure hypercholesterolemia, unspecified: Secondary | ICD-10-CM | POA: Diagnosis present

## 2016-12-07 DIAGNOSIS — L959 Vasculitis limited to the skin, unspecified: Secondary | ICD-10-CM

## 2016-12-07 DIAGNOSIS — N185 Chronic kidney disease, stage 5: Secondary | ICD-10-CM

## 2016-12-07 LAB — BASIC METABOLIC PANEL
Anion gap: 11 (ref 5–15)
BUN: 77 mg/dL — ABNORMAL HIGH (ref 6–20)
CO2: 14 mmol/L — ABNORMAL LOW (ref 22–32)
Calcium: 8.9 mg/dL (ref 8.9–10.3)
Chloride: 110 mmol/L (ref 101–111)
Creatinine, Ser: 4.41 mg/dL — ABNORMAL HIGH (ref 0.44–1.00)
GFR calc Af Amer: 11 mL/min — ABNORMAL LOW (ref >60)
GFR calc non Af Amer: 10 mL/min — ABNORMAL LOW (ref >60)
Glucose, Bld: 189 mg/dL — ABNORMAL HIGH (ref 65–99)
Potassium: 4.3 mmol/L (ref 3.5–5.1)
Sodium: 135 mmol/L (ref 135–145)

## 2016-12-07 MED ORDER — PREDNISONE 20 MG PO TABS
40.0000 mg | ORAL_TABLET | Freq: Once | ORAL | Status: AC
  Administered 2016-12-07: 40 mg via ORAL
  Filled 2016-12-07: qty 2

## 2016-12-07 MED ORDER — SODIUM CHLORIDE 0.9 % IV BOLUS (SEPSIS)
1000.0000 mL | Freq: Once | INTRAVENOUS | Status: AC
  Administered 2016-12-07: 1000 mL via INTRAVENOUS

## 2016-12-07 MED ORDER — ALBUTEROL (5 MG/ML) CONTINUOUS INHALATION SOLN
15.0000 mg/h | INHALATION_SOLUTION | RESPIRATORY_TRACT | Status: DC
  Administered 2016-12-07: 15 mg/h via RESPIRATORY_TRACT
  Filled 2016-12-07: qty 20

## 2016-12-07 MED ORDER — DEXTROSE 5 % IV SOLN
1.0000 g | Freq: Once | INTRAVENOUS | Status: AC
  Administered 2016-12-07: 1 g via INTRAVENOUS
  Filled 2016-12-07: qty 10

## 2016-12-07 MED ORDER — IBUPROFEN 200 MG PO TABS
600.0000 mg | ORAL_TABLET | Freq: Once | ORAL | Status: AC
  Administered 2016-12-07: 600 mg via ORAL
  Filled 2016-12-07: qty 3

## 2016-12-07 MED ORDER — CEFTRIAXONE SODIUM 1 G IJ SOLR
1.0000 g | Freq: Once | INTRAMUSCULAR | Status: DC
Start: 2016-12-07 — End: 2016-12-07

## 2016-12-07 MED ORDER — SODIUM CHLORIDE 0.9 % IV SOLN
Freq: Once | INTRAVENOUS | Status: AC
  Administered 2016-12-08: 1000 mL via INTRAVENOUS

## 2016-12-07 MED ORDER — IPRATROPIUM BROMIDE 0.02 % IN SOLN
0.5000 mg | Freq: Once | RESPIRATORY_TRACT | Status: AC
  Administered 2016-12-07: 0.5 mg via RESPIRATORY_TRACT
  Filled 2016-12-07: qty 2.5

## 2016-12-07 MED ORDER — GUAIFENESIN-CODEINE 100-10 MG/5ML PO SOLN
5.0000 mL | Freq: Once | ORAL | Status: AC
  Administered 2016-12-07: 5 mL via ORAL
  Filled 2016-12-07: qty 5

## 2016-12-07 MED ORDER — AZITHROMYCIN 250 MG PO TABS
500.0000 mg | ORAL_TABLET | Freq: Once | ORAL | Status: AC
  Administered 2016-12-07: 500 mg via ORAL
  Filled 2016-12-07: qty 2

## 2016-12-07 NOTE — ED Notes (Signed)
Blood culture x 2 obtained---- drawn and held.

## 2016-12-07 NOTE — ED Notes (Signed)
Bed: WA09 Expected date:  Expected time:  Means of arrival:  Comments: Productive cough

## 2016-12-07 NOTE — ED Provider Notes (Signed)
[facility] DEPT Provider Note   CSN: [id number] Arrival date & time: 12/07/16  2015  By signing my name below, I, Reola Mosher, attest that this documentation has been prepared under the direction and in the presence of Virgel Manifold, MD. Electronically Signed: Reola Mosher, ED Scribe. 12/07/16. 9:31 PM.  History   Chief Complaint Chief Complaint  Patient presents with  . Cough   The history is provided by the patient. No language interpreter was used.    HPI Comments: Catherine Aguilar is a 61 y.o. female BIB EMS, with a PMHx of HLD/HTN, chronic bronchitis, and prior CVA, who presents to the Emergency Department complaining of persistent, gradually worsening cough productive of blood-tinged yellow sputum beginning last night. She notes associated generalized weakness, rhinorrhea, congestion, shortness of breath, and chills secondary to the onset of her cough. She has been rinsing her nose w/ saline without relief of her symptoms, but no other treatments for her cough were tried prior to coming into the ED. Pt was placed on 2L oxygen upon arrival which she reports has improved her shortness of breath. Pt is not currently prescribed O2 at home. She denies fever, leg pain/swelling, or any other associated symptoms.   Past Medical History:  Diagnosis Date  . High cholesterol   . Hypertension    stopped taking medications 10 years ago  . Stroke [facility])    Patient Active Problem List   Diagnosis Date Noted  . CVA (cerebral vascular accident) ([place]) 07/03/2012  . Left facial numbness 07/02/2012  . Accelerated hypertension 07/01/2012   Past Surgical History:  Procedure Laterality Date  . ABDOMINAL HYSTERECTOMY  1985   OB History    No data available     Home Medications    Prior to Admission medications   Medication Sig Start Date End Date Taking? Authorizing Provider  aspirin 325 MG tablet Take 1 tablet (325 mg total) by mouth daily. 07/07/12   Ivan Anchors Love, PA-C   azithromycin (ZITHROMAX) 250 MG tablet Take 1 tablet (250 mg total) by mouth daily. Take 1 every day until finished. Received her first dose in the ED. Take your first dose on Monday, 10/23/2005 Patient not taking: Reported on 06/05/2016 10/24/15   Ottie Glazier, PA-C  fluticasone (FLONASE) 50 MCG/ACT nasal spray Place 2 sprays into both nostrils daily. Patient not taking: Reported on 10/22/2016 10/10/16 10/24/16  Duffy Bruce, MD  gabapentin (NEURONTIN) 300 MG capsule Take 1 capsule (300 mg total) by mouth 2 (two) times daily. For numbness left side Patient taking differently: Take 600 mg by mouth 2 (two) times daily. 300 mg in the morning 900 mg in the evening 07/07/12   Ivan Anchors Love, PA-C  hydrALAZINE (APRESOLINE) 25 MG tablet Take 2 tablets (50 mg total) by mouth 3 (three) times daily. 08/04/12   Meredith Staggers, MD  indomethacin (INDOCIN) 25 MG capsule Take 1 capsule (25 mg total) by mouth 3 (three) times daily as needed. Patient taking differently: Take 25 mg by mouth 3 (three) times daily with meals.  09/11/15   Antonietta Breach, PA-C  LINZESS 290 MCG CAPS capsule TAKE 290 [facility] BY MOUTH DAILY AS NEEDED FOR CONSTIPATION 08/24/16   Historical Provider, MD  lisinopril (PRINIVIL) 10 MG tablet Take 1 tablet (10 mg total) by mouth daily. 08/04/12   Meredith Staggers, MD  loratadine (CLARITIN) 10 MG tablet TAKE ONE TABLET BY MOUTH EVERY DAY FOR NASAL CONGESTION 09/19/16   Historical Provider, MD  losartan (COZAAR) 100 MG  tablet Take 100 mg by mouth daily. 05/08/16   Historical Provider, MD  metFORMIN (GLUCOPHAGE) 500 MG tablet TAKE 500 MG BY MOUTH AT BREAKFAST AND SUPPER 09/01/16   Historical Provider, MD  mupirocin nasal ointment (BACTROBAN) 2 % Apply in each nostril daily for 10 days Patient not taking: Reported on 10/22/2016 10/10/16   Duffy Bruce, MD  pravastatin (PRAVACHOL) 40 MG tablet Take 1 tablet (40 mg total) by mouth daily. For elevated cholesterol 07/07/12   Ivan Anchors Love, PA-C  PROVENTIL HFA  108 [phone] Base) MCG/ACT inhaler INHALE 2 PUFFS EVERY 4-6 HOURS PRN FOR SOB AND WHEEZING 09/01/16   Historical Provider, MD   Family History Family History  Problem Relation Age of Onset  . Cancer Father   . Heart attack Mother    Social History Social History  Substance Use Topics  . Smoking status: Current Every Day Smoker    Packs/day: 0.50    Years: 40.00    Types: Cigarettes  . Smokeless tobacco: Never Used  . Alcohol use Yes     Comment: on the weekends - moderately per pt   Allergies   Patient has no known allergies.  Review of Systems Review of Systems  Constitutional: Positive for chills. Negative for fever.  HENT: Positive for congestion and rhinorrhea.   Respiratory: Positive for cough and shortness of breath.   Cardiovascular: Negative for leg swelling.  All other systems reviewed and are negative.  Physical Exam Updated Vital Signs BP (!) 158/86 (BP Location: Right Arm)   Pulse (!) 112   Temp 99.8 F (37.7 C) (Axillary)   Resp (!) 25   SpO2 91%   Physical Exam  Constitutional: She appears well-developed and well-nourished.  HENT:  Head: Normocephalic.  Right Ear: External ear normal.  Left Ear: External ear normal.  Nose: Nose normal.  Mouth/Throat: Oropharynx is clear and moist.  Eyes: Conjunctivae are normal. Right eye exhibits no discharge. Left eye exhibits no discharge.  Neck: Normal range of motion.  Cardiovascular: Normal rate, regular rhythm and normal heart sounds.   No murmur heard. Pulmonary/Chest: Effort normal. No respiratory distress. She has wheezes. She has no rales.  Bilateral wheezing is noted. Mild tachypnea.   Abdominal: Soft. She exhibits no distension. There is no tenderness. There is no rebound and no guarding.  Musculoskeletal: Normal range of motion. She exhibits no edema or tenderness.  No BLE edema.   Neurological: She is alert. No cranial nerve deficit. Coordination normal.  Skin: Skin is warm and dry. No rash noted. No  erythema. No pallor.  Psychiatric: She has a normal mood and affect. Her behavior is normal.  Nursing note and vitals reviewed.  ED Treatments / Results  DIAGNOSTIC STUDIES: Oxygen Saturation is 91% on 2L via [place], low by my interpretation.   COORDINATION OF CARE: 9:29 PM-Discussed next steps with pt. Pt verbalized understanding and is agreeable with the plan.   Labs (all labs ordered are listed, but only abnormal results are displayed) Labs Reviewed  CBC WITH DIFFERENTIAL/PLATELET - Abnormal; Notable for the following:       Result Value   WBC 10.8 (*)    RBC 2.52 (*)    Hemoglobin 6.1 (*)    HCT 19.0 (*)    MCV 75.4 (*)    MCH 24.2 (*)    RDW 16.7 (*)    Platelets 512 (*)    All other components within normal limits  BASIC METABOLIC PANEL - Abnormal; Notable for the following:  CO2 14 (*)    Glucose, Bld 189 (*)    BUN 77 (*)    Creatinine, Ser 4.41 (*)    GFR calc non Af Amer 10 (*)    GFR calc Af Amer 11 (*)    All other components within normal limits  VITAMIN B12  FOLATE  IRON AND TIBC  FERRITIN  RETICULOCYTES  OCCULT BLOOD X 1 CARD TO LAB, STOOL  HEPATIC FUNCTION PANEL  PROTIME-INR  TYPE AND SCREEN  PREPARE RBC (CROSSMATCH)    EKG  EKG Interpretation  Date/Time:  Saturday December 08 2016 01:18:16 EDT Ventricular Rate:  113 PR Interval:    QRS Duration: 78 QT Interval:  331 QTC Calculation: 454 R Axis:   56 Text Interpretation:  Sinus tachycardia Probable left atrial enlargement Baseline wander in lead(s) V6 Confirmed by [place] ([id number]) on 12/08/2016 1:29:50 AM      Radiology Dg Chest 2 View  Result Date: 12/07/2016 CLINICAL DATA:  Chronic shortness of breath and productive cough. Initial encounter. EXAM: CHEST  2 VIEW COMPARISON:  Chest radiograph performed 06/05/2016, and CT of the chest performed 07/02/2016 FINDINGS: The lungs are well-aerated. Dense right-sided airspace opacity raises concern for pneumonia. There is no evidence of  pleural effusion or pneumothorax. The heart is mildly enlarged. No acute osseous abnormalities are seen. IMPRESSION: 1. Dense right-sided pneumonia. Followup PA and lateral chest X-ray is recommended in 3-4 weeks following trial of antibiotic therapy to ensure resolution and exclude underlying malignancy. 2. Mild cardiomegaly. Electronically Signed   By: Garald Balding M.D.   On: 12/07/2016 21:44    Procedures Procedures   CRITICAL CARE Performed by: Virgel Manifold Total critical care time: 40 minutes Critical care time was exclusive of separately billable procedures and treating other patients. Critical care was necessary to treat or prevent imminent or life-threatening deterioration. Critical care was time spent personally by me on the following activities: development of treatment plan with patient and/or surrogate as well as nursing, discussions with consultants, evaluation of patient's response to treatment, examination of patient, obtaining history from patient or surrogate, ordering and performing treatments and interventions, ordering and review of laboratory studies, ordering and review of radiographic studies, pulse oximetry and re-evaluation of patient's condition.   Medications Ordered in ED Medications - No data to display  Initial Impression / Assessment and Plan / ED Course  I have reviewed the triage vital signs and the nursing notes.  Pertinent labs & imaging results that were available during my care of the patient were reviewed by me and considered in my medical decision making (see chart for details).     61 year old female with cough, hemoptysis and shortness of breath. Chest x-ray is consistent with what is likely a right-sided pneumonia. She is hypoxic on room air and does not have a baseline oxygen requirement. She is given an hour-long neb, steroids and antibiotics. Respiratory status and not significantly improved.   Also noted to be significantly anemic. She does  have long drinking history. "I like beer." She estimates she drinks a 40z every few days and has been doing this and smoking since the age of 82. No hx of cirrhosis/varices that she is aware of. I really suspect this is not a GI source though, but will add of LFTs/INR.  She reports that she has been having intermittent hemoptysis for awhile, but cannot quantify for exactly how long. It has been going on at least prior to the beginning of the year. Sometimes  worse than currently.  Will CT chest. Long smoking hx raises concern for malignancy. PE is a consideration, but I feel less likely.   Final Clinical Impressions(s) / ED Diagnoses   Final diagnoses:  AKI (acute kidney injury) ([place])  Abdominal distention  Hemoptysis  Symptomatic anemia   New Prescriptions New Prescriptions   No medications on file   I personally preformed the services scribed in my presence. The recorded information has been reviewed is accurate. Virgel Manifold, MD.     Virgel Manifold, MD 12/08/16 [phone]

## 2016-12-07 NOTE — ED Triage Notes (Signed)
Brought in by EMS from home with c/o productive cough, onset last night.  Pt reports that she has been coughing with blood-tinged, yellow phlegm since last night.  Hx of chronic bronchitis.  Pt aslo reports generalized weakness and dizziness on standing.

## 2016-12-08 ENCOUNTER — Inpatient Hospital Stay (HOSPITAL_COMMUNITY): Payer: Medicaid Other

## 2016-12-08 ENCOUNTER — Emergency Department (HOSPITAL_COMMUNITY): Payer: Medicaid Other

## 2016-12-08 ENCOUNTER — Other Ambulatory Visit (HOSPITAL_COMMUNITY): Payer: Medicaid Other

## 2016-12-08 DIAGNOSIS — L959 Vasculitis limited to the skin, unspecified: Secondary | ICD-10-CM

## 2016-12-08 DIAGNOSIS — M3131 Wegener's granulomatosis with renal involvement: Secondary | ICD-10-CM | POA: Diagnosis not present

## 2016-12-08 DIAGNOSIS — R140 Abdominal distension (gaseous): Secondary | ICD-10-CM | POA: Diagnosis present

## 2016-12-08 DIAGNOSIS — Z8249 Family history of ischemic heart disease and other diseases of the circulatory system: Secondary | ICD-10-CM | POA: Diagnosis not present

## 2016-12-08 DIAGNOSIS — J9601 Acute respiratory failure with hypoxia: Secondary | ICD-10-CM | POA: Diagnosis not present

## 2016-12-08 DIAGNOSIS — J42 Unspecified chronic bronchitis: Secondary | ICD-10-CM | POA: Diagnosis present

## 2016-12-08 DIAGNOSIS — E669 Obesity, unspecified: Secondary | ICD-10-CM | POA: Diagnosis present

## 2016-12-08 DIAGNOSIS — A419 Sepsis, unspecified organism: Secondary | ICD-10-CM

## 2016-12-08 DIAGNOSIS — R0902 Hypoxemia: Secondary | ICD-10-CM | POA: Diagnosis not present

## 2016-12-08 DIAGNOSIS — Z720 Tobacco use: Secondary | ICD-10-CM | POA: Diagnosis not present

## 2016-12-08 DIAGNOSIS — M318 Other specified necrotizing vasculopathies: Secondary | ICD-10-CM

## 2016-12-08 DIAGNOSIS — J189 Pneumonia, unspecified organism: Secondary | ICD-10-CM | POA: Diagnosis present

## 2016-12-08 DIAGNOSIS — N179 Acute kidney failure, unspecified: Secondary | ICD-10-CM

## 2016-12-08 DIAGNOSIS — N185 Chronic kidney disease, stage 5: Secondary | ICD-10-CM

## 2016-12-08 DIAGNOSIS — I776 Arteritis, unspecified: Secondary | ICD-10-CM | POA: Diagnosis not present

## 2016-12-08 DIAGNOSIS — R0489 Hemorrhage from other sites in respiratory passages: Secondary | ICD-10-CM

## 2016-12-08 DIAGNOSIS — Z7982 Long term (current) use of aspirin: Secondary | ICD-10-CM | POA: Diagnosis not present

## 2016-12-08 DIAGNOSIS — D649 Anemia, unspecified: Secondary | ICD-10-CM | POA: Diagnosis not present

## 2016-12-08 DIAGNOSIS — Z7189 Other specified counseling: Secondary | ICD-10-CM

## 2016-12-08 DIAGNOSIS — N170 Acute kidney failure with tubular necrosis: Secondary | ICD-10-CM | POA: Diagnosis not present

## 2016-12-08 DIAGNOSIS — F1910 Other psychoactive substance abuse, uncomplicated: Secondary | ICD-10-CM

## 2016-12-08 DIAGNOSIS — D631 Anemia in chronic kidney disease: Secondary | ICD-10-CM | POA: Diagnosis present

## 2016-12-08 DIAGNOSIS — E785 Hyperlipidemia, unspecified: Secondary | ICD-10-CM | POA: Diagnosis present

## 2016-12-08 DIAGNOSIS — I10 Essential (primary) hypertension: Secondary | ICD-10-CM | POA: Diagnosis present

## 2016-12-08 DIAGNOSIS — M3130 Wegener's granulomatosis without renal involvement: Secondary | ICD-10-CM | POA: Diagnosis present

## 2016-12-08 DIAGNOSIS — E876 Hypokalemia: Secondary | ICD-10-CM | POA: Diagnosis present

## 2016-12-08 DIAGNOSIS — F17210 Nicotine dependence, cigarettes, uncomplicated: Secondary | ICD-10-CM | POA: Diagnosis present

## 2016-12-08 DIAGNOSIS — F1010 Alcohol abuse, uncomplicated: Secondary | ICD-10-CM | POA: Diagnosis present

## 2016-12-08 DIAGNOSIS — Z7984 Long term (current) use of oral hypoglycemic drugs: Secondary | ICD-10-CM | POA: Diagnosis not present

## 2016-12-08 DIAGNOSIS — R042 Hemoptysis: Secondary | ICD-10-CM | POA: Diagnosis not present

## 2016-12-08 DIAGNOSIS — Z6831 Body mass index (BMI) 31.0-31.9, adult: Secondary | ICD-10-CM | POA: Diagnosis not present

## 2016-12-08 DIAGNOSIS — Z79899 Other long term (current) drug therapy: Secondary | ICD-10-CM | POA: Diagnosis not present

## 2016-12-08 DIAGNOSIS — E7800 Pure hypercholesterolemia, unspecified: Secondary | ICD-10-CM | POA: Diagnosis present

## 2016-12-08 DIAGNOSIS — Z8673 Personal history of transient ischemic attack (TIA), and cerebral infarction without residual deficits: Secondary | ICD-10-CM | POA: Diagnosis not present

## 2016-12-08 DIAGNOSIS — Z0000 Encounter for general adult medical examination without abnormal findings: Secondary | ICD-10-CM

## 2016-12-08 HISTORY — DX: Hyperlipidemia, unspecified: E78.5

## 2016-12-08 HISTORY — DX: Other specified necrotizing vasculopathies: M31.8

## 2016-12-08 HISTORY — DX: Tobacco use: Z72.0

## 2016-12-08 HISTORY — DX: Hemoptysis: R04.2

## 2016-12-08 HISTORY — DX: Essential (primary) hypertension: I10

## 2016-12-08 HISTORY — DX: Vasculitis limited to the skin, unspecified: L95.9

## 2016-12-08 HISTORY — DX: Pneumonia, unspecified organism: J18.9

## 2016-12-08 HISTORY — DX: Acute respiratory failure with hypoxia: J96.01

## 2016-12-08 HISTORY — DX: Wegener's granulomatosis with renal involvement: M31.31

## 2016-12-08 HISTORY — DX: Other psychoactive substance abuse, uncomplicated: F19.10

## 2016-12-08 HISTORY — DX: Acute kidney failure, unspecified: N17.9

## 2016-12-08 HISTORY — DX: Hemorrhage from other sites in respiratory passages: R04.89

## 2016-12-08 HISTORY — DX: Sepsis, unspecified organism: A41.9

## 2016-12-08 LAB — CK TOTAL AND CKMB (NOT AT ARMC)
CK, MB: 12.3 ng/mL — AB (ref 0.5–5.0)
Relative Index: 1.6 (ref 0.0–2.5)
Total CK: 749 U/L — ABNORMAL HIGH (ref 38–234)

## 2016-12-08 LAB — CBC
HCT: 19.5 % — ABNORMAL LOW (ref 36.0–46.0)
HEMATOCRIT: 17 % — AB (ref 36.0–46.0)
HEMOGLOBIN: 5.6 g/dL — AB (ref 12.0–15.0)
Hemoglobin: 6.3 g/dL — CL (ref 12.0–15.0)
MCH: 25 pg — ABNORMAL LOW (ref 26.0–34.0)
MCH: 24.1 pg — AB (ref 26.0–34.0)
MCHC: 32.9 g/dL (ref 30.0–36.0)
MCHC: 32.3 g/dL (ref 30.0–36.0)
MCV: 75.9 fL — AB (ref 78.0–100.0)
MCV: 74.7 fL — ABNORMAL LOW (ref 78.0–100.0)
PLATELETS: 454 10*3/uL — AB (ref 150–400)
PLATELETS: 413 10*3/uL — AB (ref 150–400)
RBC: 2.24 MIL/uL — AB (ref 3.87–5.11)
RBC: 2.61 MIL/uL — AB (ref 3.87–5.11)
RDW: 16.9 % — ABNORMAL HIGH (ref 11.5–15.5)
RDW: 16.9 % — AB (ref 11.5–15.5)
WBC: 11.1 10*3/uL — AB (ref 4.0–10.5)
WBC: 9.5 10*3/uL (ref 4.0–10.5)

## 2016-12-08 LAB — IRON AND TIBC
Iron: 7 ug/dL — ABNORMAL LOW (ref 28–170)
Saturation Ratios: 3 % — ABNORMAL LOW (ref 10.4–31.8)
TIBC: 234 ug/dL — ABNORMAL LOW (ref 250–450)
UIBC: 227 ug/dL

## 2016-12-08 LAB — CBC WITH DIFFERENTIAL/PLATELET
BASOS ABS: 0 10*3/uL (ref 0.0–0.1)
BASOS PCT: 0 %
Basophils Absolute: 0 10*3/uL (ref 0.0–0.1)
Basophils Relative: 0 %
EOS ABS: 0 10*3/uL (ref 0.0–0.7)
Eosinophils Absolute: 0 10*3/uL (ref 0.0–0.7)
Eosinophils Relative: 0 %
Eosinophils Relative: 0 %
HCT: 19 % — ABNORMAL LOW (ref 36.0–46.0)
HCT: 16.9 % — ABNORMAL LOW (ref 36.0–46.0)
Hemoglobin: 6.1 g/dL — CL (ref 12.0–15.0)
Hemoglobin: 5.3 g/dL — CL (ref 12.0–15.0)
Lymphocytes Relative: 20 %
Lymphocytes Relative: 14 %
Lymphs Abs: 2.2 10*3/uL (ref 0.7–4.0)
Lymphs Abs: 1.5 10*3/uL (ref 0.7–4.0)
MCH: 24.2 pg — ABNORMAL LOW (ref 26.0–34.0)
MCH: 23.5 pg — AB (ref 26.0–34.0)
MCHC: 32.1 g/dL (ref 30.0–36.0)
MCHC: 31.4 g/dL (ref 30.0–36.0)
MCV: 75.4 fL — ABNORMAL LOW (ref 78.0–100.0)
MCV: 74.8 fL — ABNORMAL LOW (ref 78.0–100.0)
MONO ABS: 0.5 10*3/uL (ref 0.1–1.0)
Monocytes Absolute: 0.6 10*3/uL (ref 0.1–1.0)
Monocytes Relative: 6 %
Monocytes Relative: 5 %
NEUTROS PCT: 81 %
Neutro Abs: 8 10*3/uL — ABNORMAL HIGH (ref 1.7–7.7)
Neutro Abs: 8.6 10*3/uL — ABNORMAL HIGH (ref 1.7–7.7)
Neutrophils Relative %: 74 %
PLATELETS: 456 10*3/uL — AB (ref 150–400)
Platelets: 512 10*3/uL — ABNORMAL HIGH (ref 150–400)
RBC: 2.52 MIL/uL — ABNORMAL LOW (ref 3.87–5.11)
RBC: 2.26 MIL/uL — AB (ref 3.87–5.11)
RDW: 16.7 % — ABNORMAL HIGH (ref 11.5–15.5)
RDW: 17.1 % — ABNORMAL HIGH (ref 11.5–15.5)
WBC: 10.8 10*3/uL — ABNORMAL HIGH (ref 4.0–10.5)
WBC: 10.6 10*3/uL — AB (ref 4.0–10.5)

## 2016-12-08 LAB — GLUCOSE, CAPILLARY
GLUCOSE-CAPILLARY: 155 mg/dL — AB (ref 65–99)
GLUCOSE-CAPILLARY: 177 mg/dL — AB (ref 65–99)
GLUCOSE-CAPILLARY: 109 mg/dL — AB (ref 65–99)
GLUCOSE-CAPILLARY: 170 mg/dL — AB (ref 65–99)
GLUCOSE-CAPILLARY: 179 mg/dL — AB (ref 65–99)
Glucose-Capillary: 285 mg/dL — ABNORMAL HIGH (ref 65–99)

## 2016-12-08 LAB — MRSA PCR SCREENING: MRSA by PCR: NEGATIVE

## 2016-12-08 LAB — RAPID URINE DRUG SCREEN, HOSP PERFORMED
AMPHETAMINES: NOT DETECTED
BARBITURATES: NOT DETECTED
BENZODIAZEPINES: NOT DETECTED
Cocaine: NOT DETECTED
Opiates: POSITIVE — AB
TETRAHYDROCANNABINOL: POSITIVE — AB

## 2016-12-08 LAB — RESPIRATORY PANEL BY PCR
ADENOVIRUS-RVPPCR: NOT DETECTED
Bordetella pertussis: NOT DETECTED
CHLAMYDOPHILA PNEUMONIAE-RVPPCR: NOT DETECTED
CORONAVIRUS 229E-RVPPCR: NOT DETECTED
CORONAVIRUS OC43-RVPPCR: NOT DETECTED
Coronavirus HKU1: NOT DETECTED
Coronavirus NL63: NOT DETECTED
INFLUENZA B-RVPPCR: NOT DETECTED
Influenza A: NOT DETECTED
METAPNEUMOVIRUS-RVPPCR: NOT DETECTED
Mycoplasma pneumoniae: NOT DETECTED
PARAINFLUENZA VIRUS 3-RVPPCR: NOT DETECTED
Parainfluenza Virus 1: NOT DETECTED
Parainfluenza Virus 2: NOT DETECTED
Parainfluenza Virus 4: NOT DETECTED
Respiratory Syncytial Virus: NOT DETECTED
Rhinovirus / Enterovirus: NOT DETECTED

## 2016-12-08 LAB — EXPECTORATED SPUTUM ASSESSMENT W GRAM STAIN, RFLX TO RESP C

## 2016-12-08 LAB — HEPATIC FUNCTION PANEL
ALT: 30 U/L (ref 14–54)
AST: 36 U/L (ref 15–41)
Albumin: 2.9 g/dL — ABNORMAL LOW (ref 3.5–5.0)
Alkaline Phosphatase: 79 U/L (ref 38–126)
Bilirubin, Direct: <0.1 mg/dL — ABNORMAL LOW (ref 0.1–0.5)
Total Bilirubin: 0.5 mg/dL (ref 0.3–1.2)
Total Protein: 7.6 g/dL (ref 6.5–8.1)

## 2016-12-08 LAB — PROCALCITONIN
Procalcitonin: 2.66 ng/mL
Procalcitonin: 3.33 ng/mL

## 2016-12-08 LAB — BLOOD GAS, ARTERIAL
Acid-base deficit: 13.3 mmol/L — ABNORMAL HIGH (ref 0.0–2.0)
Bicarbonate: 12.4 mmol/L — ABNORMAL LOW (ref 20.0–28.0)
Drawn by: 232811
O2 CONTENT: 6 L/min
O2 SAT: 95.1 %
PATIENT TEMPERATURE: 98.6
PCO2 ART: 28.7 mmHg — AB (ref 32.0–48.0)
PO2 ART: 93 mmHg (ref 83.0–108.0)
pH, Arterial: 7.258 — ABNORMAL LOW (ref 7.350–7.450)

## 2016-12-08 LAB — URINALYSIS, ROUTINE W REFLEX MICROSCOPIC
Bilirubin Urine: NEGATIVE
Glucose, UA: NEGATIVE mg/dL
Ketones, ur: NEGATIVE mg/dL
Leukocytes, UA: NEGATIVE
Nitrite: NEGATIVE
PROTEIN: 100 mg/dL — AB
Specific Gravity, Urine: 1.01 (ref 1.005–1.030)
pH: 5 (ref 5.0–8.0)

## 2016-12-08 LAB — ABO/RH
ABO/RH(D): A POS
ABO/RH(D): A POS

## 2016-12-08 LAB — OCCULT BLOOD X 1 CARD TO LAB, STOOL: Fecal Occult Bld: NEGATIVE

## 2016-12-08 LAB — BASIC METABOLIC PANEL
ANION GAP: 12 (ref 5–15)
BUN: 65 mg/dL — ABNORMAL HIGH (ref 6–20)
CALCIUM: 8.4 mg/dL — AB (ref 8.9–10.3)
CO2: 13 mmol/L — ABNORMAL LOW (ref 22–32)
Chloride: 111 mmol/L (ref 101–111)
Creatinine, Ser: 4.26 mg/dL — ABNORMAL HIGH (ref 0.44–1.00)
GFR, EST AFRICAN AMERICAN: 12 mL/min — AB (ref >60)
GFR, EST NON AFRICAN AMERICAN: 10 mL/min — AB (ref >60)
Glucose, Bld: 187 mg/dL — ABNORMAL HIGH (ref 65–99)
Potassium: 4.7 mmol/L (ref 3.5–5.1)
SODIUM: 136 mmol/L (ref 135–145)

## 2016-12-08 LAB — RETICULOCYTES
RBC.: 2.16 MIL/uL — ABNORMAL LOW (ref 3.87–5.11)
Retic Count, Absolute: 47.5 10*3/uL (ref 19.0–186.0)
Retic Ct Pct: 2.2 % (ref 0.4–3.1)

## 2016-12-08 LAB — CREATININE, URINE, RANDOM: Creatinine, Urine: 67.44 mg/dL

## 2016-12-08 LAB — HIV ANTIBODY (ROUTINE TESTING W REFLEX): HIV Screen 4th Generation wRfx: NONREACTIVE

## 2016-12-08 LAB — FERRITIN: Ferritin: 186 ng/mL (ref 11–307)

## 2016-12-08 LAB — VITAMIN B12: Vitamin B-12: 349 pg/mL (ref 180–914)

## 2016-12-08 LAB — PROTEIN / CREATININE RATIO, URINE
CREATININE, URINE: 62.6 mg/dL
PROTEIN CREATININE RATIO: 2.33 mg/mg{creat} — AB (ref 0.00–0.15)
TOTAL PROTEIN, URINE: 146 mg/dL

## 2016-12-08 LAB — STREP PNEUMONIAE URINARY ANTIGEN: STREP PNEUMO URINARY ANTIGEN: NEGATIVE

## 2016-12-08 LAB — ETHANOL

## 2016-12-08 LAB — SEDIMENTATION RATE: Sed Rate: >140 mm/hr — ABNORMAL HIGH (ref 0–22)

## 2016-12-08 LAB — PROTIME-INR
INR: 1.21
Prothrombin Time: 15.4 seconds — ABNORMAL HIGH (ref 11.4–15.2)

## 2016-12-08 LAB — LACTIC ACID, PLASMA: Lactic Acid, Venous: 1.1 mmol/L (ref 0.5–1.9)

## 2016-12-08 LAB — INFLUENZA PANEL BY PCR (TYPE A & B)
INFLAPCR: NEGATIVE
INFLBPCR: NEGATIVE

## 2016-12-08 LAB — C-REACTIVE PROTEIN: CRP: 19.6 mg/dL — AB (ref <1.0)

## 2016-12-08 LAB — APTT: aPTT: 44 seconds — ABNORMAL HIGH (ref 24–36)

## 2016-12-08 LAB — PREPARE RBC (CROSSMATCH)

## 2016-12-08 LAB — SODIUM, URINE, RANDOM: SODIUM UR: 53 mmol/L

## 2016-12-08 LAB — EXPECTORATED SPUTUM ASSESSMENT W REFEX TO RESP CULTURE

## 2016-12-08 LAB — TROPONIN I
Troponin I: <0.03 ng/mL (ref <0.03)
Troponin I: <0.03 ng/mL (ref <0.03)

## 2016-12-08 LAB — FOLATE: Folate: 11.6 ng/mL (ref >5.9)

## 2016-12-08 MED ORDER — NICOTINE 21 MG/24HR TD PT24
21.0000 mg | MEDICATED_PATCH | Freq: Every day | TRANSDERMAL | Status: DC
  Administered 2016-12-08 – 2016-12-16 (×9): 21 mg via TRANSDERMAL
  Filled 2016-12-08 (×9): qty 1

## 2016-12-08 MED ORDER — LIDOCAINE-EPINEPHRINE 2 %-1:100000 IJ SOLN
20.0000 mL | Freq: Once | INTRAMUSCULAR | Status: DC
Start: 2016-12-08 — End: 2016-12-14
  Filled 2016-12-08: qty 20

## 2016-12-08 MED ORDER — VITAMIN B-1 100 MG PO TABS
100.0000 mg | ORAL_TABLET | Freq: Every day | ORAL | Status: DC
  Administered 2016-12-08 – 2016-12-14 (×7): 100 mg via ORAL
  Filled 2016-12-08 (×7): qty 1

## 2016-12-08 MED ORDER — ZOLPIDEM TARTRATE 5 MG PO TABS: 5.0000 mg | ORAL_TABLET | Freq: Every evening | ORAL | Status: DC | PRN

## 2016-12-08 MED ORDER — LORATADINE 10 MG PO TABS
10.0000 mg | ORAL_TABLET | Freq: Every day | ORAL | Status: DC | PRN
  Filled 2016-12-08: qty 1

## 2016-12-08 MED ORDER — FUROSEMIDE 10 MG/ML IJ SOLN
40.0000 mg | Freq: Two times a day (BID) | INTRAMUSCULAR | Status: DC
  Administered 2016-12-08 – 2016-12-14 (×13): 40 mg via INTRAVENOUS
  Filled 2016-12-08 (×14): qty 4

## 2016-12-08 MED ORDER — POLYETHYLENE GLYCOL 3350 17 G PO PACK
17.0000 g | PACK | Freq: Every day | ORAL | Status: DC
  Administered 2016-12-09 – 2016-12-13 (×5): 17 g via ORAL
  Filled 2016-12-08 (×9): qty 1

## 2016-12-08 MED ORDER — SODIUM CHLORIDE 0.9 % IV SOLN
250.0000 mg | Freq: Four times a day (QID) | INTRAVENOUS | Status: DC
  Administered 2016-12-08 – 2016-12-13 (×21): 250 mg via INTRAVENOUS
  Filled 2016-12-08 (×24): qty 2

## 2016-12-08 MED ORDER — INSULIN ASPART 100 UNIT/ML ~~LOC~~ SOLN
0.0000 [IU] | Freq: Three times a day (TID) | SUBCUTANEOUS | Status: DC
  Administered 2016-12-08: 2 [IU] via SUBCUTANEOUS

## 2016-12-08 MED ORDER — IPRATROPIUM BROMIDE 0.02 % IN SOLN: 0.5000 mg | RESPIRATORY_TRACT | Status: DC

## 2016-12-08 MED ORDER — LEVALBUTEROL HCL 1.25 MG/0.5ML IN NEBU
1.2500 mg | INHALATION_SOLUTION | Freq: Four times a day (QID) | RESPIRATORY_TRACT | Status: DC
Start: 2016-12-08 — End: 2016-12-08

## 2016-12-08 MED ORDER — PRAVASTATIN SODIUM 40 MG PO TABS
40.0000 mg | ORAL_TABLET | Freq: Every day | ORAL | Status: DC
  Filled 2016-12-08: qty 1

## 2016-12-08 MED ORDER — SODIUM CHLORIDE 0.9 % IV SOLN
INTRAVENOUS | Status: DC
  Administered 2016-12-10: 05:00:00 via INTRAVENOUS

## 2016-12-08 MED ORDER — ACETAMINOPHEN 325 MG PO TABS
650.0000 mg | ORAL_TABLET | Freq: Once | ORAL | Status: AC
  Administered 2016-12-08: 650 mg via ORAL

## 2016-12-08 MED ORDER — LORAZEPAM 2 MG/ML IJ SOLN: 1.0000 mg | Freq: Four times a day (QID) | INTRAMUSCULAR | Status: DC | PRN

## 2016-12-08 MED ORDER — DIPHENHYDRAMINE HCL 50 MG PO CAPS
50.0000 mg | ORAL_CAPSULE | Freq: Once | ORAL | Status: AC
  Administered 2016-12-08: 50 mg via ORAL
  Filled 2016-12-08: qty 1

## 2016-12-08 MED ORDER — LORAZEPAM 1 MG PO TABS: 1.0000 mg | ORAL_TABLET | Freq: Four times a day (QID) | ORAL | Status: DC | PRN

## 2016-12-08 MED ORDER — SENNA 8.6 MG PO TABS
1.0000 | ORAL_TABLET | Freq: Every day | ORAL | Status: DC | PRN
  Administered 2016-12-13: 8.6 mg via ORAL
  Filled 2016-12-08: qty 1

## 2016-12-08 MED ORDER — INSULIN ASPART 100 UNIT/ML ~~LOC~~ SOLN
0.0000 [IU] | Freq: Every day | SUBCUTANEOUS | Status: DC
  Administered 2016-12-08: 3 [IU] via SUBCUTANEOUS

## 2016-12-08 MED ORDER — DM-GUAIFENESIN ER 30-600 MG PO TB12
1.0000 | ORAL_TABLET | Freq: Two times a day (BID) | ORAL | Status: DC
  Administered 2016-12-08 – 2016-12-16 (×17): 1 via ORAL
  Filled 2016-12-08 (×18): qty 1

## 2016-12-08 MED ORDER — PIPERACILLIN-TAZOBACTAM IN DEX 2-0.25 GM/50ML IV SOLN
2.2500 g | Freq: Three times a day (TID) | INTRAVENOUS | Status: AC
  Administered 2016-12-08 – 2016-12-14 (×18): 2.25 g via INTRAVENOUS
  Filled 2016-12-08 (×19): qty 50

## 2016-12-08 MED ORDER — VANCOMYCIN HCL IN DEXTROSE 1-5 GM/200ML-% IV SOLN
1000.0000 mg | Freq: Once | INTRAVENOUS | Status: AC
  Administered 2016-12-08: 1000 mg via INTRAVENOUS
  Filled 2016-12-08: qty 200

## 2016-12-08 MED ORDER — ADULT MULTIVITAMIN W/MINERALS CH
1.0000 | ORAL_TABLET | Freq: Every day | ORAL | Status: DC
  Administered 2016-12-09 – 2016-12-16 (×8): 1 via ORAL
  Filled 2016-12-08 (×9): qty 1

## 2016-12-08 MED ORDER — HYDRALAZINE HCL 20 MG/ML IJ SOLN
10.0000 mg | INTRAMUSCULAR | Status: DC | PRN
  Administered 2016-12-08 – 2016-12-09 (×2): 10 mg via INTRAVENOUS
  Filled 2016-12-08 (×2): qty 1

## 2016-12-08 MED ORDER — PANTOPRAZOLE SODIUM 40 MG PO TBEC
40.0000 mg | DELAYED_RELEASE_TABLET | Freq: Every day | ORAL | Status: DC
  Administered 2016-12-09 – 2016-12-16 (×8): 40 mg via ORAL
  Filled 2016-12-08 (×8): qty 1

## 2016-12-08 MED ORDER — HYDRALAZINE HCL 50 MG PO TABS
50.0000 mg | ORAL_TABLET | Freq: Three times a day (TID) | ORAL | Status: DC
  Administered 2016-12-08 – 2016-12-11 (×9): 50 mg via ORAL
  Filled 2016-12-08 (×11): qty 1

## 2016-12-08 MED ORDER — GABAPENTIN 300 MG PO CAPS
600.0000 mg | ORAL_CAPSULE | Freq: Two times a day (BID) | ORAL | Status: DC
  Administered 2016-12-08: 600 mg via ORAL
  Filled 2016-12-08 (×2): qty 2

## 2016-12-08 MED ORDER — HYDRALAZINE HCL 20 MG/ML IJ SOLN
5.0000 mg | INTRAMUSCULAR | Status: DC | PRN
  Administered 2016-12-08: 5 mg via INTRAVENOUS
  Filled 2016-12-08: qty 1

## 2016-12-08 MED ORDER — FOLIC ACID 1 MG PO TABS
1.0000 mg | ORAL_TABLET | Freq: Every day | ORAL | Status: DC
  Administered 2016-12-09 – 2016-12-14 (×6): 1 mg via ORAL
  Filled 2016-12-08 (×7): qty 1

## 2016-12-08 MED ORDER — FLUTICASONE PROPIONATE 50 MCG/ACT NA SUSP
1.0000 | Freq: Every day | NASAL | Status: DC | PRN
  Filled 2016-12-08: qty 16

## 2016-12-08 MED ORDER — LORAZEPAM 2 MG/ML IJ SOLN: 0.0000 mg | Freq: Two times a day (BID) | INTRAMUSCULAR | Status: DC

## 2016-12-08 MED ORDER — INSULIN ASPART 100 UNIT/ML ~~LOC~~ SOLN
0.0000 [IU] | SUBCUTANEOUS | Status: DC
  Administered 2016-12-08 – 2016-12-09 (×6): 4 [IU] via SUBCUTANEOUS
  Administered 2016-12-09: 3 [IU] via SUBCUTANEOUS
  Administered 2016-12-09: 4 [IU] via SUBCUTANEOUS
  Administered 2016-12-10: 11 [IU] via SUBCUTANEOUS
  Administered 2016-12-10: 4 [IU] via SUBCUTANEOUS
  Administered 2016-12-10 (×2): 7 [IU] via SUBCUTANEOUS
  Administered 2016-12-10 – 2016-12-11 (×5): 4 [IU] via SUBCUTANEOUS
  Administered 2016-12-11: 11 [IU] via SUBCUTANEOUS
  Administered 2016-12-11: 20 [IU] via SUBCUTANEOUS
  Administered 2016-12-11: 11 [IU] via SUBCUTANEOUS
  Administered 2016-12-12: 15 [IU] via SUBCUTANEOUS
  Administered 2016-12-12: 20 [IU] via SUBCUTANEOUS
  Administered 2016-12-12: 7 [IU] via SUBCUTANEOUS
  Administered 2016-12-12: 15 [IU] via SUBCUTANEOUS
  Administered 2016-12-12: 3 [IU] via SUBCUTANEOUS
  Administered 2016-12-12: 7 [IU] via SUBCUTANEOUS
  Administered 2016-12-13: 20 [IU] via SUBCUTANEOUS
  Administered 2016-12-13: 4 [IU] via SUBCUTANEOUS
  Administered 2016-12-13 (×2): 7 [IU] via SUBCUTANEOUS
  Administered 2016-12-13 – 2016-12-14 (×2): 15 [IU] via SUBCUTANEOUS
  Administered 2016-12-14: 11 [IU] via SUBCUTANEOUS
  Administered 2016-12-14 (×2): 7 [IU] via SUBCUTANEOUS
  Administered 2016-12-15: 3 [IU] via SUBCUTANEOUS
  Administered 2016-12-15 – 2016-12-16 (×3): 20 [IU] via SUBCUTANEOUS
  Administered 2016-12-16: 7 [IU] via SUBCUTANEOUS
  Administered 2016-12-16: 3 [IU] via SUBCUTANEOUS

## 2016-12-08 MED ORDER — LEVALBUTEROL HCL 1.25 MG/0.5ML IN NEBU
1.2500 mg | INHALATION_SOLUTION | Freq: Four times a day (QID) | RESPIRATORY_TRACT | Status: DC
  Administered 2016-12-08 – 2016-12-12 (×20): 1.25 mg via RESPIRATORY_TRACT
  Filled 2016-12-08 (×23): qty 0.5

## 2016-12-08 MED ORDER — AZITHROMYCIN 500 MG PO TABS: 500.0000 mg | ORAL_TABLET | Freq: Once | ORAL | Status: DC

## 2016-12-08 MED ORDER — SODIUM CHLORIDE 0.9 % IV BOLUS (SEPSIS)
1500.0000 mL | Freq: Once | INTRAVENOUS | Status: AC
  Administered 2016-12-08: 1500 mL via INTRAVENOUS

## 2016-12-08 MED ORDER — SODIUM CHLORIDE 0.9 % IV SOLN
375.0000 mg/m2 | Freq: Once | INTRAVENOUS | Status: DC
  Filled 2016-12-08: qty 80

## 2016-12-08 MED ORDER — LORAZEPAM 2 MG/ML IJ SOLN
0.0000 mg | Freq: Four times a day (QID) | INTRAMUSCULAR | Status: DC
  Filled 2016-12-08: qty 1

## 2016-12-08 MED ORDER — SODIUM CHLORIDE 0.9 % IV SOLN: Freq: Once | INTRAVENOUS | Status: DC

## 2016-12-08 MED ORDER — SODIUM CHLORIDE 0.9 % IV SOLN
375.0000 mg/m2 | Freq: Once | INTRAVENOUS | Status: AC
  Administered 2016-12-08: 800 mg via INTRAVENOUS
  Filled 2016-12-08: qty 80

## 2016-12-08 MED ORDER — THIAMINE HCL 100 MG/ML IJ SOLN
100.0000 mg | Freq: Every day | INTRAMUSCULAR | Status: DC
  Filled 2016-12-08 (×3): qty 1

## 2016-12-08 MED ORDER — OXYCODONE-ACETAMINOPHEN 5-325 MG PO TABS: 1.0000 | ORAL_TABLET | ORAL | Status: DC | PRN

## 2016-12-08 MED ORDER — ACETAMINOPHEN 325 MG PO TABS: 650.0000 mg | ORAL_TABLET | Freq: Four times a day (QID) | ORAL | Status: DC | PRN

## 2016-12-08 MED ORDER — PIPERACILLIN-TAZOBACTAM 3.375 G IVPB
3.3750 g | Freq: Three times a day (TID) | INTRAVENOUS | Status: DC
  Administered 2016-12-08: 3.375 g via INTRAVENOUS
  Filled 2016-12-08 (×3): qty 50

## 2016-12-08 MED ORDER — IPRATROPIUM BROMIDE 0.02 % IN SOLN
0.5000 mg | Freq: Four times a day (QID) | RESPIRATORY_TRACT | Status: DC
  Administered 2016-12-08 – 2016-12-12 (×20): 0.5 mg via RESPIRATORY_TRACT
  Filled 2016-12-08 (×20): qty 2.5

## 2016-12-08 MED ORDER — SODIUM CHLORIDE 0.9 % IV SOLN
INTRAVENOUS | Status: DC
  Administered 2016-12-08: 1000 mL via INTRAVENOUS

## 2016-12-08 NOTE — Assessment & Plan Note (Addendum)
Dc ace inhibitor 12/08/16 Some better 12/09/16 Might need renal bippsy Monitor

## 2016-12-08 NOTE — Consult Note (Signed)
Reason for Consult:punch bx for rash on upper back Referring Physician: Dr Tennis Must is an 61 y.o. female.  HPI: 61 yo AAF with h/o CVA, HTN, tobacco use presented to outside hospital with shortness of breath, hemoptysis and chest pain. She had been having these symptoms for at least 1 month. She also reports that she has chronic itching on her upper back and uses a back scratcher to scratch her upper back. This is been going on for at least a year. She was found to have acute anemia, acute renal injury and questionable pneumonia on admission to outside hospital. After initial workup she was found to have a presentation consistent with granulomatosis with polyangiitis. She ended up being transferred to [facility] because of worsening respiratory status.  We were asked to consult and perform a biopsy of a rash on her upper back. History is obtained from the chart and limited history from her because she is on BiPAP and gets short of breath when it is taken off  She is currently getting Rituxan  Past Medical History:  Diagnosis Date  . High cholesterol   . Hypertension    stopped taking medications 10 years ago  . Stroke [facility])     Past Surgical History:  Procedure Laterality Date  . ABDOMINAL HYSTERECTOMY  1985    Family History  Problem Relation Age of Onset  . Cancer Father   . Heart attack Mother     Social History:  reports that she has been smoking Cigarettes.  She has a 20.00 pack-year smoking history. She has never used smokeless tobacco. She reports that she drinks alcohol. She reports that she uses drugs, including Marijuana.  Allergies: No Known Allergies  Medications: I have reviewed the patient's current medications.  Results for orders placed or performed during the hospital encounter of 12/07/16 (from the past 48 hour(s))  Vitamin B12     Status: None   Collection Time: 12/07/16 12:00 AM  Result Value Ref Range   Vitamin B-12 349 180 - 914 pg/mL     Comment: (NOTE) This assay is not validated for testing neonatal or myeloproliferative syndrome specimens for Vitamin B12 levels. Performed at [facility], [place] [address] [place], [place] [address]   Iron and TIBC     Status: Abnormal   Collection Time: 12/07/16 12:00 AM  Result Value Ref Range   Iron 7 (L) 28 - 170 ug/dL   TIBC 234 (L) 250 - 450 ug/dL   Saturation Ratios 3 (L) 10.4 - 31.8 %   UIBC 227 ug/dL    Comment: Performed at [facility], [place] [address], [place], [place] [address]  Ferritin     Status: None   Collection Time: 12/07/16 12:00 AM  Result Value Ref Range   Ferritin 186 11 - 307 ng/mL    Comment: Performed at [facility], [place] [address] [place], [place] [address]  Folate     Status: None   Collection Time: 12/07/16 12:00 AM  Result Value Ref Range   Folate 11.6 >5.9 ng/mL    Comment: Performed at [facility], [place] [address] [place], [place] [address]  CBC with Differential     Status: Abnormal   Collection Time: 12/07/16 11:13 PM  Result Value Ref Range   WBC 10.8 (H) 4.0 - 10.5 K/uL   RBC 2.52 (L) 3.87 - 5.11 MIL/uL   Hemoglobin 6.1 (LL) 12.0 - 15.0 g/dL  Comment: CRITICAL RESULT CALLED TO, READ BACK BY AND VERIFIED WITH: TALKINGTON,J RN [id number] COVINGTON,N    HCT 19.0 (L) 36.0 - 46.0 %   MCV 75.4 (L) 78.0 - 100.0 fL   MCH 24.2 (L) 26.0 - 34.0 pg   MCHC 32.1 30.0 - 36.0 g/dL   RDW 16.7 (H) 11.5 - 15.5 %   Platelets 512 (H) 150 - 400 K/uL   Neutrophils Relative % 74 %   Lymphocytes Relative 20 %   Monocytes Relative 6 %   Eosinophils Relative 0 %   Basophils Relative 0 %   Neutro Abs 8.0 (H) 1.7 - 7.7 K/uL   Lymphs Abs 2.2 0.7 - 4.0 K/uL   Monocytes Absolute 0.6 0.1 - 1.0 K/uL   Eosinophils Absolute 0.0 0.0 - 0.7 K/uL   Basophils Absolute 0.0 0.0 - 0.1 K/uL   RBC Morphology POLYCHROMASIA PRESENT   Basic metabolic panel     Status: Abnormal   Collection Time: 12/07/16 11:13 PM  Result Value Ref Range   Sodium  135 135 - 145 mmol/L   Potassium 4.3 3.5 - 5.1 mmol/L   Chloride 110 101 - 111 mmol/L   CO2 14 (L) 22 - 32 mmol/L   Glucose, Bld 189 (H) 65 - 99 mg/dL   BUN 77 (H) 6 - 20 mg/dL   Creatinine, Ser 4.41 (H) 0.44 - 1.00 mg/dL   Calcium 8.9 8.9 - 10.3 mg/dL   GFR calc non Af Amer 10 (L) >60 mL/min   GFR calc Af Amer 11 (L) >60 mL/min    Comment: (NOTE) The eGFR has been calculated using the CKD EPI equation. This calculation has not been validated in all clinical situations. eGFR's persistently <60 mL/min signify possible Chronic Kidney Disease.    Anion gap 11 5 - 15  Occult blood card to lab, stool     Status: None   Collection Time: 12/07/16 11:57 PM  Result Value Ref Range   Fecal Occult Bld NEGATIVE NEGATIVE  Type and screen [place]     Status: None (Preliminary result)   Collection Time: 12/08/16 12:00 AM  Result Value Ref Range   ABO/RH(D) A POS    Antibody Screen NEG    Sample Expiration 12/11/2016    Unit Number [id number]    Blood Component Type RED CELLS,LR    Unit division 00    Status of Unit ALLOCATED    Transfusion Status OK TO TRANSFUSE    Crossmatch Result Compatible    Unit Number [id number]    Blood Component Type RED CELLS,LR    Unit division 00    Status of Unit ALLOCATED    Transfusion Status OK TO TRANSFUSE    Crossmatch Result Compatible   Prepare RBC     Status: None   Collection Time: 12/08/16 12:00 AM  Result Value Ref Range   Order Confirmation ORDER PROCESSED BY BLOOD BANK   ABO/Rh     Status: None   Collection Time: 12/08/16 12:00 AM  Result Value Ref Range   ABO/RH(D) A POS   Reticulocytes     Status: Abnormal   Collection Time: 12/08/16 12:01 AM  Result Value Ref Range   Retic Ct Pct 2.2 0.4 - 3.1 %   RBC. 2.16 (L) 3.87 - 5.11 MIL/uL   Retic Count, Manual 47.5 19.0 - 186.0 K/uL  Hepatic function panel     Status: Abnormal   Collection Time: 12/08/16 12:01 AM  Result Value Ref Range  Total Protein 7.6 6.5  - 8.1 g/dL   Albumin 2.9 (L) 3.5 - 5.0 g/dL   AST 36 15 - 41 U/L   ALT 30 14 - 54 U/L   Alkaline Phosphatase 79 38 - 126 U/L   Total Bilirubin 0.5 0.3 - 1.2 mg/dL   Bilirubin, Direct <0.1 (L) 0.1 - 0.5 mg/dL   Indirect Bilirubin NOT CALCULATED 0.3 - 0.9 mg/dL  Protime-INR     Status: Abnormal   Collection Time: 12/08/16 12:01 AM  Result Value Ref Range   Prothrombin Time 15.4 (H) 11.4 - 15.2 seconds   INR 1.21   APTT     Status: Abnormal   Collection Time: 12/08/16 12:01 AM  Result Value Ref Range   aPTT 44 (H) 24 - 36 seconds    Comment:        IF BASELINE aPTT IS ELEVATED, SUGGEST PATIENT RISK ASSESSMENT BE USED TO DETERMINE APPROPRIATE ANTICOAGULANT THERAPY.   Culture, sputum-assessment     Status: None   Collection Time: 12/08/16  1:08 AM  Result Value Ref Range   Specimen Description EXPECTORATED SPUTUM    Special Requests NONE    Sputum evaluation      THIS SPECIMEN IS ACCEPTABLE FOR SPUTUM CULTURE Performed at [facility], [place] [address] [place], [place] [address]    Report Status 12/08/2016 FINAL   Culture, respiratory (NON-Expectorated)     Status: None (Preliminary result)   Collection Time: 12/08/16  1:08 AM  Result Value Ref Range   Specimen Description EXPECTORATED SPUTUM    Special Requests NONE Reflexed from [id number]    Gram Stain      FEW SQUAMOUS EPITHELIAL CELLS PRESENT FEW WBC PRESENT, PREDOMINANTLY MONONUCLEAR FEW GRAM POSITIVE RODS FEW GRAM POSITIVE COCCI IN PAIRS FEW GRAM NEGATIVE RODS Performed at [facility], [place] [address], [place], [place] [address]    Culture PENDING    Report Status PENDING   Influenza panel by PCR (type A & B)     Status: None   Collection Time: 12/08/16  1:22 AM  Result Value Ref Range   Influenza A By PCR NEGATIVE NEGATIVE   Influenza B By PCR NEGATIVE NEGATIVE    Comment: (NOTE) The Xpert Xpress Flu assay is intended as an aid in the diagnosis of  influenza and should not be used as a sole basis for  treatment.  This  assay is FDA approved for nasopharyngeal swab specimens only. Nasal  washings and aspirates are unacceptable for Xpert Xpress Flu testing.   Ethanol     Status: None   Collection Time: 12/08/16  1:22 AM  Result Value Ref Range   Alcohol, Ethyl (B) <5 <5 mg/dL    Comment:        LOWEST DETECTABLE LIMIT FOR SERUM ALCOHOL IS 5 mg/dL FOR MEDICAL PURPOSES ONLY   CBC     Status: Abnormal   Collection Time: 12/08/16  1:22 AM  Result Value Ref Range   WBC 11.1 (H) 4.0 - 10.5 K/uL   RBC 2.24 (L) 3.87 - 5.11 MIL/uL   Hemoglobin 5.6 (LL) 12.0 - 15.0 g/dL    Comment: CRITICAL VALUE NOTED.  VALUE IS CONSISTENT WITH PREVIOUSLY REPORTED AND CALLED VALUE.   HCT 17.0 (L) 36.0 - 46.0 %   MCV 75.9 (L) 78.0 - 100.0 fL   MCH 25.0 (L) 26.0 - 34.0 pg   MCHC 32.9 30.0 - 36.0 g/dL   RDW 16.9 (H) 11.5 - 15.5 %  Platelets 454 (H) 150 - 400 K/uL  Procalcitonin     Status: None   Collection Time: 12/08/16  1:22 AM  Result Value Ref Range   Procalcitonin 2.66 ng/mL    Comment:        Interpretation: PCT > 2 ng/mL: Systemic infection (sepsis) is likely, unless other causes are known. (NOTE)         ICU PCT Algorithm               Non ICU PCT Algorithm    ----------------------------     ------------------------------         PCT < 0.25 ng/mL                 PCT < 0.1 ng/mL     Stopping of antibiotics            Stopping of antibiotics       strongly encouraged.               strongly encouraged.    ----------------------------     ------------------------------       PCT level decrease by               PCT < 0.25 ng/mL       >= 80% from peak PCT       OR PCT 0.25 - 0.5 ng/mL          Stopping of antibiotics                                             encouraged.     Stopping of antibiotics           encouraged.    ----------------------------     ------------------------------       PCT level decrease by              PCT >= 0.25 ng/mL       < 80% from peak PCT        AND PCT >=  0.5 ng/mL            Continuing antibiotics                                               encouraged.       Continuing antibiotics            encouraged.    ----------------------------     ------------------------------     PCT level increase compared          PCT > 0.5 ng/mL         with peak PCT AND          PCT >= 0.5 ng/mL             Escalation of antibiotics                                          strongly encouraged.      Escalation of antibiotics        strongly encouraged.   Blood gas, arterial     Status: Abnormal   Collection Time: 12/08/16  1:30 AM  Result Value Ref Range   O2 Content 6.0 L/min   Delivery systems NASAL CANNULA    pH, Arterial 7.258 (L) 7.350 - 7.450   pCO2 arterial 28.7 (L) 32.0 - 48.0 mmHg   pO2, Arterial 93.0 83.0 - 108.0 mmHg   Bicarbonate 12.4 (L) 20.0 - 28.0 mmol/L   Acid-base deficit 13.3 (H) 0.0 - 2.0 mmol/L   O2 Saturation 95.1 %   Patient temperature 98.6    Collection site RIGHT RADIAL    Drawn by [id number]    Sample type ARTERIAL    Allens test (pass/fail) PASS PASS  HIV antibody (Routine Testing)     Status: None   Collection Time: 12/08/16  1:34 AM  Result Value Ref Range   HIV Screen 4th Generation wRfx Non Reactive Non Reactive    Comment: (NOTE) Performed At: [facility] [address] [place], [place] [id number] Lindon Romp MD PZ:[id number]   Lactic acid, plasma     Status: None   Collection Time: 12/08/16  1:43 AM  Result Value Ref Range   Lactic Acid, Venous 1.1 0.5 - 1.9 mmol/L  Creatinine, urine, random     Status: None   Collection Time: 12/08/16  2:54 AM  Result Value Ref Range   Creatinine, Urine 67.44 mg/dL    Comment: Performed at [facility], [place] [address] [place], [place] [address]  Sodium, urine, random     Status: None   Collection Time: 12/08/16  2:54 AM  Result Value Ref Range   Sodium, Ur 53 mmol/L    Comment: Performed at [place] [address], [place], [place]  [address]  Strep pneumoniae urinary antigen     Status: None   Collection Time: 12/08/16  2:54 AM  Result Value Ref Range   Strep Pneumo Urinary Antigen NEGATIVE NEGATIVE    Comment:        Infection due to S. pneumoniae cannot be absolutely ruled out since the antigen present may be below the detection limit of the test. Performed at [facility], [address], [place], [place] [address]   Rapid urine drug screen (hospital performed)     Status: Abnormal   Collection Time: 12/08/16  2:54 AM  Result Value Ref Range   Opiates POSITIVE (A) NONE DETECTED   Cocaine NONE DETECTED NONE DETECTED   Benzodiazepines NONE DETECTED NONE DETECTED   Amphetamines NONE DETECTED NONE DETECTED   Tetrahydrocannabinol POSITIVE (A) NONE DETECTED   Barbiturates NONE DETECTED NONE DETECTED    Comment:        DRUG SCREEN FOR MEDICAL PURPOSES ONLY.  IF CONFIRMATION IS NEEDED FOR ANY PURPOSE, NOTIFY LAB WITHIN 5 DAYS.        LOWEST DETECTABLE LIMITS FOR URINE DRUG SCREEN Drug Class       Cutoff (ng/mL) Amphetamine      1000 Barbiturate      200 Benzodiazepine   867 Tricyclics       619 Opiates          300 Cocaine          300 THC              50   Urinalysis, Routine w reflex microscopic     Status: Abnormal   Collection Time: 12/08/16  2:54 AM  Result Value Ref Range   Color, Urine YELLOW YELLOW   APPearance CLOUDY (A) CLEAR   Specific Gravity, Urine 1.010 1.005 - 1.030   pH  5.0 5.0 - 8.0   Glucose, UA NEGATIVE NEGATIVE mg/dL   Hgb urine dipstick LARGE (A) NEGATIVE   Bilirubin Urine NEGATIVE NEGATIVE   Ketones, ur NEGATIVE NEGATIVE mg/dL   Protein, ur 050 (A) NEGATIVE mg/dL   Nitrite NEGATIVE NEGATIVE   Leukocytes, UA NEGATIVE NEGATIVE   RBC / HPF TOO NUMEROUS TO COUNT 0 - 5 RBC/hpf   WBC, UA 6-30 0 - 5 WBC/hpf   Bacteria, UA RARE (A) NONE SEEN   Squamous Epithelial / LPF 0-5 (A) NONE SEEN   Budding Yeast PRESENT   Protein / creatinine ratio, urine     Status: Abnormal    Collection Time: 12/08/16  2:54 AM  Result Value Ref Range   Creatinine, Urine 62.60 mg/dL   Total Protein, Urine 146 mg/dL    Comment: NO NORMAL RANGE ESTABLISHED FOR THIS TEST   Protein Creatinine Ratio 2.33 (H) 0.00 - 0.15 mg/mg[Cre]    Comment: Performed at [facility], [address], [place], [place] [address]  C-reactive protein     Status: Abnormal   Collection Time: 12/08/16  3:20 AM  Result Value Ref Range   CRP 19.6 (H) <1.0 mg/dL    Comment: Performed at [facility], [address], [place], [place] [address]  Sedimentation rate     Status: Abnormal   Collection Time: 12/08/16  3:28 AM  Result Value Ref Range   Sed Rate >140 (H) 0 - 22 mm/hr  MRSA PCR Screening     Status: None   Collection Time: 12/08/16  4:22 AM  Result Value Ref Range   MRSA by PCR NEGATIVE NEGATIVE    Comment:        The GeneXpert MRSA Assay (FDA approved for NASAL specimens only), is one component of a comprehensive MRSA colonization surveillance program. It is not intended to diagnose MRSA infection nor to guide or monitor treatment for MRSA infections.   Glucose, capillary     Status: Abnormal   Collection Time: 12/08/16  4:45 AM  Result Value Ref Range   Glucose-Capillary 285 (H) 65 - 99 mg/dL  Type and screen [facility]     Status: None (Preliminary result)   Collection Time: 12/08/16  5:36 AM  Result Value Ref Range   ABO/RH(D) A POS    Antibody Screen NEG    Sample Expiration 12/11/2016    Unit Number [id number]    Blood Component Type RED CELLS,LR    Unit division 00    Status of Unit ALLOCATED    Transfusion Status OK TO TRANSFUSE    Crossmatch Result Compatible    Unit Number [id number]    Blood Component Type RED CELLS,LR    Unit division 00    Status of Unit ALLOCATED    Transfusion Status OK TO TRANSFUSE    Crossmatch Result Compatible    Unit Number [id number]    Blood Component Type RED CELLS,LR    Unit division 00     Status of Unit ISSUED    Transfusion Status OK TO TRANSFUSE    Crossmatch Result Compatible   ABO/Rh     Status: None   Collection Time: 12/08/16  5:36 AM  Result Value Ref Range   ABO/RH(D) A POS   Respiratory Panel by PCR     Status: None   Collection Time: 12/08/16  8:30 AM  Result Value Ref Range   Adenovirus NOT DETECTED NOT DETECTED   Coronavirus 229E NOT DETECTED NOT DETECTED  Coronavirus HKU1 NOT DETECTED NOT DETECTED   Coronavirus NL63 NOT DETECTED NOT DETECTED   Coronavirus OC43 NOT DETECTED NOT DETECTED   Metapneumovirus NOT DETECTED NOT DETECTED   Rhinovirus / Enterovirus NOT DETECTED NOT DETECTED   Influenza A NOT DETECTED NOT DETECTED   Influenza B NOT DETECTED NOT DETECTED   Parainfluenza Virus 1 NOT DETECTED NOT DETECTED   Parainfluenza Virus 2 NOT DETECTED NOT DETECTED   Parainfluenza Virus 3 NOT DETECTED NOT DETECTED   Parainfluenza Virus 4 NOT DETECTED NOT DETECTED   Respiratory Syncytial Virus NOT DETECTED NOT DETECTED   Bordetella pertussis NOT DETECTED NOT DETECTED   Chlamydophila pneumoniae NOT DETECTED NOT DETECTED   Mycoplasma pneumoniae NOT DETECTED NOT DETECTED  CK total and CKMB (cardiac)not at [facility]     Status: Abnormal   Collection Time: 12/08/16  8:36 AM  Result Value Ref Range   Total CK 749 (H) 38 - 234 U/L   CK, MB 12.3 (H) 0.5 - 5.0 ng/mL   Relative Index 1.6 0.0 - 2.5  Procalcitonin - Baseline     Status: None   Collection Time: 12/08/16  8:36 AM  Result Value Ref Range   Procalcitonin 3.33 ng/mL    Comment:        Interpretation: PCT > 2 ng/mL: Systemic infection (sepsis) is likely, unless other causes are known. (NOTE)         ICU PCT Algorithm               Non ICU PCT Algorithm    ----------------------------     ------------------------------         PCT < 0.25 ng/mL                 PCT < 0.1 ng/mL     Stopping of antibiotics            Stopping of antibiotics       strongly encouraged.               strongly encouraged.     ----------------------------     ------------------------------       PCT level decrease by               PCT < 0.25 ng/mL       >= 80% from peak PCT       OR PCT 0.25 - 0.5 ng/mL          Stopping of antibiotics                                             encouraged.     Stopping of antibiotics           encouraged.    ----------------------------     ------------------------------       PCT level decrease by              PCT >= 0.25 ng/mL       < 80% from peak PCT        AND PCT >= 0.5 ng/mL            Continuing antibiotics  encouraged.       Continuing antibiotics            encouraged.    ----------------------------     ------------------------------     PCT level increase compared          PCT > 0.5 ng/mL         with peak PCT AND          PCT >= 0.5 ng/mL             Escalation of antibiotics                                          strongly encouraged.      Escalation of antibiotics        strongly encouraged.   Sedimentation rate     Status: Abnormal   Collection Time: 12/08/16  8:36 AM  Result Value Ref Range   Sed Rate >140 (H) 0 - 22 mm/hr  Glucose, capillary     Status: Abnormal   Collection Time: 12/08/16  8:36 AM  Result Value Ref Range   Glucose-Capillary 177 (H) 65 - 99 mg/dL  CBC with Differential/Platelet     Status: Abnormal   Collection Time: 12/08/16  9:09 AM  Result Value Ref Range   WBC 10.6 (H) 4.0 - 10.5 K/uL   RBC 2.26 (L) 3.87 - 5.11 MIL/uL   Hemoglobin 5.3 (LL) 12.0 - 15.0 g/dL    Comment: REPEATED TO VERIFY CRITICAL RESULT CALLED TO, READ BACK BY AND VERIFIED WITH: C SMITH,RN [id number] 12/08/16 D BRADLEY    HCT 16.9 (L) 36.0 - 46.0 %   MCV 74.8 (L) 78.0 - 100.0 fL   MCH 23.5 (L) 26.0 - 34.0 pg   MCHC 31.4 30.0 - 36.0 g/dL   RDW 17.1 (H) 11.5 - 15.5 %   Platelets 456 (H) 150 - 400 K/uL   Neutrophils Relative % 81 %   Lymphocytes Relative 14 %   Monocytes Relative 5 %   Eosinophils Relative 0 %    Basophils Relative 0 %   Neutro Abs 8.6 (H) 1.7 - 7.7 K/uL   Lymphs Abs 1.5 0.7 - 4.0 K/uL   Monocytes Absolute 0.5 0.1 - 1.0 K/uL   Eosinophils Absolute 0.0 0.0 - 0.7 K/uL   Basophils Absolute 0.0 0.0 - 0.1 K/uL   WBC Morphology MILD LEFT SHIFT (1-5% METAS, OCC MYELO, OCC BANDS)   Basic metabolic panel     Status: Abnormal   Collection Time: 12/08/16  9:11 AM  Result Value Ref Range   Sodium 136 135 - 145 mmol/L   Potassium 4.7 3.5 - 5.1 mmol/L   Chloride 111 101 - 111 mmol/L   CO2 13 (L) 22 - 32 mmol/L   Glucose, Bld 187 (H) 65 - 99 mg/dL   BUN 65 (H) 6 - 20 mg/dL   Creatinine, Ser 4.26 (H) 0.44 - 1.00 mg/dL   Calcium 8.4 (L) 8.9 - 10.3 mg/dL   GFR calc non Af Amer 10 (L) >60 mL/min   GFR calc Af Amer 12 (L) >60 mL/min    Comment: (NOTE) The eGFR has been calculated using the CKD EPI equation. This calculation has not been validated in all clinical situations. eGFR's persistently <60 mL/min signify possible Chronic Kidney Disease.    Anion gap 12 5 - 15  Glucose, capillary     Status:  Abnormal   Collection Time: 12/08/16  9:11 AM  Result Value Ref Range   Glucose-Capillary 155 (H) 65 - 99 mg/dL   Comment 1 Notify RN    Comment 2 Document in Chart   Prepare RBC     Status: None   Collection Time: 12/08/16  9:38 AM  Result Value Ref Range   Order Confirmation ORDER PROCESSED BY BLOOD BANK   Glucose, capillary     Status: Abnormal   Collection Time: 12/08/16 12:27 PM  Result Value Ref Range   Glucose-Capillary 109 (H) 65 - 99 mg/dL   Comment 1 Notify RN    Comment 2 Document in Chart   Troponin I     Status: None   Collection Time: 12/08/16  3:20 PM  Result Value Ref Range   Troponin I <0.03 <0.03 ng/mL  Glucose, capillary     Status: Abnormal   Collection Time: 12/08/16  4:26 PM  Result Value Ref Range   Glucose-Capillary 170 (H) 65 - 99 mg/dL   Comment 1 Notify RN    Comment 2 Document in Chart     Ct Abdomen Pelvis Wo Contrast  Result Date:  12/08/2016 CLINICAL DATA:  Abdominal distention. EXAM: CT ABDOMEN AND PELVIS WITHOUT CONTRAST TECHNIQUE: Multidetector CT imaging of the abdomen and pelvis was performed following the standard protocol without IV contrast. COMPARISON:  Chest CT earlier this day. FINDINGS: Breathing motion artifact in the upper abdomen. Lower chest: Motion through the lower chest. Right-sided pulmonary opacity is partially included. The heart is enlarged. Hepatobiliary: No focal lesion allowing for lack contrast. Appears prominent size. Gallbladder physiologically distended, no calcified stone. No biliary dilatation. Pancreas: No ductal dilatation or gross peripancreatic inflammation, motion limited evaluation. Spleen: Normal in size without focal abnormality. Adrenals/Urinary Tract: No adrenal nodule. No hydronephrosis. Right kidney slightly ptopic. Detailed evaluation limited by motion. Probable cyst in the lower right kidney. Urinary bladder is physiologically distended. Stomach/Bowel: Motion artifact limits assessment. Stomach is decompressed. No small bowel dilatation to suggest obstruction. Moderate stool burden throughout the ascending and transverse colon. Tortuous sigmoid colon that is partially obscured. Normal appendix. Questionable edema in the lower mesentery. No evidence of associated bowel inflammation. Vascular/Lymphatic: Abdominal aortic atherosclerosis. No aneurysm. Retroperitoneal phleboliths. Small mesenteric nodes not enlarged by size criteria. Reproductive: Status post hysterectomy. No adnexal masses. Other: No ascites or free air. Small fat containing umbilical hernia. Musculoskeletal: There are no acute or suspicious osseous abnormalities. Degenerative change throughout spine and both sacroiliac joints. IMPRESSION: 1. No definite acute abnormality in abdomen/pelvis. Moderate stool burden in the proximal colon can be seen with constipation. 2. Equivocal lower mesenteric edema without associated bowel  abnormality. This is likely systemic etiology and can be seen with third-spacing. 3. Aortic atherosclerosis. Electronically Signed   By: Jeb Levering M.D.   On: 12/08/2016 03:47   Dg Chest 2 View  Result Date: 12/07/2016 CLINICAL DATA:  Chronic shortness of breath and productive cough. Initial encounter. EXAM: CHEST  2 VIEW COMPARISON:  Chest radiograph performed 06/05/2016, and CT of the chest performed 07/02/2016 FINDINGS: The lungs are well-aerated. Dense right-sided airspace opacity raises concern for pneumonia. There is no evidence of pleural effusion or pneumothorax. The heart is mildly enlarged. No acute osseous abnormalities are seen. IMPRESSION: 1. Dense right-sided pneumonia. Followup PA and lateral chest X-ray is recommended in 3-4 weeks following trial of antibiotic therapy to ensure resolution and exclude underlying malignancy. 2. Mild cardiomegaly. Electronically Signed   By: Francoise Schaumann.D.  On: 12/07/2016 21:44   Ct Chest Wo Contrast  Result Date: 12/08/2016 CLINICAL DATA:  Acute onset of productive cough and generalized weakness. Rhinorrhea, congestion and shortness of breath. Chills. Initial encounter. EXAM: CT CHEST WITHOUT CONTRAST TECHNIQUE: Multidetector CT imaging of the chest was performed following the standard protocol without IV contrast. COMPARISON:  Chest radiograph performed 12/07/2016, and CT of the chest performed 07/02/2016 FINDINGS: Cardiovascular: The heart is mildly enlarged. Mild coronary artery calcification is seen. Mild calcification is seen along the aortic arch. The great vessels are grossly unremarkable in appearance. Mediastinum/Nodes: A mildly enlarged 1.3 cm right paratracheal node is seen. No definite additional mediastinal lymphadenopathy is seen. Trace pericardial fluid remains within normal limits. The thyroid gland is unremarkable. No axillary lymphadenopathy is seen. Lungs/Pleura: Dense right-sided airspace opacification is compatible with  pneumonia, mildly worsened from the recent prior chest radiograph. Mild hazy opacity is also seen at the left lung apex, new from prior CT and concerning for pneumonia. No pleural effusion or pneumothorax is seen. No dominant mass is identified. Previously noted upper lobe nodular opacities are obscured by the patient's pneumonia. Upper Abdomen: The visualized portions of the liver and the spleen are unremarkable in appearance. The visualized portions of the pancreas, adrenal glands and kidneys are within normal limits. Musculoskeletal: No acute osseous abnormalities are identified. The visualized musculature is unremarkable in appearance. IMPRESSION: 1. Dense right-sided airspace opacification is compatible with pneumonia, mildly worsened from the recent prior chest radiograph. Mild hazy opacity at the left lung apex is also concerning for pneumonia. 2. 1.3 cm right paratracheal node may reflect the underlying infection. 3. Mild cardiomegaly.  Mild coronary artery calcifications seen. Electronically Signed   By: Garald Balding M.D.   On: 12/08/2016 00:58   US Renal  Result Date: 12/08/2016 CLINICAL DATA:  Acute kidney injury. History of hypertension and hypercholesterolemia. EXAM: RENAL / URINARY TRACT ULTRASOUND COMPLETE COMPARISON:  None. FINDINGS: Right Kidney: Length: 10.4 cm. Echogenic parenchyma. No mass or hydronephrosis visualized. Left Kidney: Length: 14.4 cm. Echogenic parenchyma. No mass or hydronephrosis visualized. Bladder: Appears normal for degree of bladder distention. IMPRESSION: Echogenic kidneys consistent with medical renal disease. RIGHT kidney measures smaller than the LEFT which may be technical or, reflect atrophy. Electronically Signed   By: Elon Alas M.D.   On: 12/08/2016 01:39    Review of Systems  Unable to perform ROS: Other  unable to obtain secondary to being on bipap Blood pressure (!) 170/96, pulse 92, temperature 98.9 F (37.2 C), temperature source Oral, resp.  rate 14, height '5\' 9"'$  (1.753 m), weight 95.4 kg (210 lb 5.1 oz), SpO2 100 %. Physical Exam  Vitals reviewed. Constitutional: She is oriented to person, place, and time. She appears well-developed and well-nourished. No distress.  obese  HENT:  Head: Normocephalic and atraumatic.  Right Ear: External ear normal.  Left Ear: External ear normal.  Eyes: Conjunctivae are normal. No scleral icterus.  Neck: Normal range of motion. Neck supple. No tracheal deviation present. No thyromegaly present.  Cardiovascular: Normal rate, normal heart sounds and intact distal pulses.   Respiratory: Effort normal. No stridor. No respiratory distress. She has no wheezes. She has rhonchi.  On bipap  GI: Soft. She exhibits no distension. There is no tenderness. There is no rebound.  Musculoskeletal: She exhibits no edema or tenderness.  Lymphadenopathy:    She has no cervical adenopathy.  Neurological: She is alert and oriented to person, place, and time. She exhibits normal muscle tone.  Skin:  Skin is warm and dry. She is not diaphoretic. No erythema. No pallor.  On upper back - center, mainly Rt upper back/scapula/shoulder area are multiple hyperpigmented skin lesions about 0.5-1cm each, not raised, not papular. No cellulitis. No induration. No fluctuance.   Psychiatric: She has a normal mood and affect. Her behavior is normal. Judgment and thought content normal.    Assessment/Plan: Systemic vasculitis syndrome Acute respiratory failure with hypoxia Intra-alveolar hemorrhage ARF HTN Protein calorie malnutrition Upper back hyperpigmented skin lesions, question rash  I am not terribly impressed with the lesions on her upper back. They are localized and not systemic. On gross examination it is not consistent with a typical rash. The hyperpigmented areas could just be areas where the patient has previously scratched and the skin has healed  Nonetheless we can perform a punch biopsy  She is at risk for  wound healing issues as well as wound breakdown and bleeding  We will try to perform this tomorrow if time permits  Leighton Ruff. Redmond Pulling, MD, FACS General, Bariatric, & Minimally Invasive Surgery [facility], [place]  [facility] M 12/08/2016, 5:50 PM

## 2016-12-08 NOTE — Assessment & Plan Note (Signed)
Suspected in upper back  Plan Skin bx dr Redmond Pulling called

## 2016-12-08 NOTE — Assessment & Plan Note (Signed)
Full code. Patient updatd. Called husband but went to wrong number x 2

## 2016-12-08 NOTE — Consult Note (Signed)
PULMONARY  / CRITICAL CARE MEDICINE CONSULTATION   Name: Catherine Aguilar MRN: [id number] DOB: 02-Jan-1956    ADMISSION DATE:  12/07/2016 CONSULTATION DATE: December 08, 2016  REQUESTING CLINICIAN: Ivor Costa, MD PRIMARY SERVICE: [facility]  CHIEF COMPLAINT:  Hemoptysis   HISTORY OF PRESENT ILLNESS:   61 y/o woman with substance use hx who presents to ED with hemoptysis worsening over several weeks and dyspnea. She was found to be in significant renal impairment and with infiltrate on chest imaging. She also was found to be in renal impairment. She also a prior history of stroke. She denies sick contacts, and does not have significant sputum production. She presented to the ED 4-6 weeks ago complaining of epistaxis which was attributed to the weather.   PAST MEDICAL HISTORY :  Past Medical History:  Diagnosis Date  . High cholesterol   . Hypertension    stopped taking medications 10 years ago  . Stroke [facility])    Past Surgical History:  Procedure Laterality Date  . ABDOMINAL HYSTERECTOMY  1985   Prior to Admission medications   Medication Sig Start Date End Date Taking? Authorizing Provider  aspirin 325 MG tablet Take 1 tablet (325 mg total) by mouth daily. 07/07/12  Yes Ivan Anchors Love, PA-C  fluticasone (FLONASE) 50 MCG/ACT nasal spray Place 1 spray into the nose daily as needed for congestion. 10/10/16  Yes Historical Provider, MD  gabapentin (NEURONTIN) 300 MG capsule Take 1 capsule (300 mg total) by mouth 2 (two) times daily. For numbness left side Patient taking differently: Take 600 mg by mouth 2 (two) times daily. 300 mg in the morning 900 mg in the evening 07/07/12  Yes Ivan Anchors Love, PA-C  hydrALAZINE (APRESOLINE) 50 MG tablet Take 50 mg by mouth 3 (three) times daily. 11/01/16  Yes Historical Provider, MD  indomethacin (INDOCIN) 25 MG capsule Take 1 capsule (25 mg total) by mouth 3 (three) times daily as needed. Patient taking differently: Take 25 mg by mouth 3 (three)  times daily with meals.  09/11/15  Yes Antonietta Breach, PA-C  lisinopril (PRINIVIL) 10 MG tablet Take 1 tablet (10 mg total) by mouth daily. 08/04/12  Yes Meredith Staggers, MD  loratadine (CLARITIN) 10 MG tablet TAKE ONE TABLET BY MOUTH EVERY DAY FOR NASAL CONGESTION 09/19/16  Yes Historical Provider, MD  losartan (COZAAR) 100 MG tablet Take 100 mg by mouth daily. 05/08/16  Yes Historical Provider, MD  metFORMIN (GLUCOPHAGE) 500 MG tablet TAKE 500 MG BY MOUTH AT BREAKFAST AND SUPPER 09/01/16  Yes Historical Provider, MD  pravastatin (PRAVACHOL) 40 MG tablet Take 1 tablet (40 mg total) by mouth daily. For elevated cholesterol 07/07/12  Yes Ivan Anchors Love, PA-C  PROVENTIL HFA 108 (90 Base) MCG/ACT inhaler INHALE 2 PUFFS EVERY 4-6 HOURS PRN FOR SOB AND WHEEZING 09/01/16  Yes Historical Provider, MD  sodium chloride (OCEAN) 0.65 % SOLN nasal spray Place 1 spray into both nostrils 3 (three) times daily.   Yes Historical Provider, MD  azithromycin (ZITHROMAX) 250 MG tablet Take 1 tablet (250 mg total) by mouth daily. Take 1 every day until finished. Received her first dose in the ED. Take your first dose on Monday, 10/23/2005 Patient not taking: Reported on 06/05/2016 10/24/15   Ottie Glazier, PA-C  fluticasone (FLONASE) 50 MCG/ACT nasal spray Place 2 sprays into both nostrils daily. Patient not taking: Reported on 10/22/2016 10/10/16 10/24/16  Duffy Bruce, MD  hydrALAZINE (APRESOLINE) 25 MG tablet Take 2 tablets (50 mg total) by mouth  3 (three) times daily. Patient not taking: Reported on 12/07/2016 08/04/12   Meredith Staggers, MD  mupirocin nasal ointment Drue Stager) 2 % Apply in each nostril daily for 10 days Patient not taking: Reported on 10/22/2016 10/10/16   Duffy Bruce, MD   No Known Allergies  FAMILY HISTORY:  Family History  Problem Relation Age of Onset  . Cancer Father   . Heart attack Mother    SOCIAL HISTORY:  reports that she has been smoking Cigarettes.  She has a 20.00 pack-year smoking  history. She has never used smokeless tobacco. She reports that she drinks alcohol. She reports that she uses drugs, including Marijuana.  REVIEW OF SYSTEMS:  Per HPI  SUBJECTIVE:   VITAL SIGNS: Temp:  [99.8 F (37.7 C)] 99.8 F (37.7 C) (03/30 2035) Pulse Rate:  [106-128] 116 (03/31 0300) Resp:  [24-28] 24 (03/31 0300) BP: (125-172)/(61-98) 154/83 (03/31 0300) SpO2:  [86 %-100 %] 96 % (03/31 0300) Weight:  [223 lb (101.2 kg)] 223 lb (101.2 kg) (03/31 0145) HEMODYNAMICS:   VENTILATOR SETTINGS:   INTAKE / OUTPUT: Intake/Output      03/30 0701 - 03/31 0700   IV Piggyback 2600   Total Intake(mL/kg) 2600 (25.7)   Net +2600         PHYSICAL EXAMINATION: General:  Obese woman with mild increase in respiratory effort Neuro:  Awake, alert, although speech content seemed consistent with intoxication HEENT:  MMM Neck: No masses Cardiovascular:  Normal S1/S2 Lungs:  Rhonchi and fine crackles on R. Abdomen:  Obese, but soft Musculoskeletal:  No deformities Skin:  No rashes on visible skin  LABS:  CBC  Recent Labs Lab 12/07/16 2313 12/08/16 0122  WBC 10.8* 11.1*  HGB 6.1* 5.6*  HCT 19.0* 17.0*  PLT 512* 454*   Coag's  Recent Labs Lab 12/08/16 0001  APTT 44*  INR 1.21   BMET  Recent Labs Lab 12/07/16 2313  NA 135  K 4.3  CL 110  CO2 14*  BUN 77*  CREATININE 4.41*  GLUCOSE 189*   Electrolytes  Recent Labs Lab 12/07/16 2313  CALCIUM 8.9   Sepsis Markers  Recent Labs Lab 12/08/16 0122 12/08/16 0143  LATICACIDVEN  --  1.1  PROCALCITON 2.66  --    ABG  Recent Labs Lab 12/08/16 0130  PHART 7.258*  PCO2ART 28.7*  PO2ART 93.0   Liver Enzymes  Recent Labs Lab 12/08/16 0001  AST 36  ALT 30  ALKPHOS 79  BILITOT 0.5  ALBUMIN 2.9*   Cardiac Enzymes No results for input(s): TROPONINI, PROBNP in the last 168 hours. Glucose No results for input(s): GLUCAP in the last 168 hours.  Imaging Ct Abdomen Pelvis Wo Contrast  Result Date:  12/08/2016 CLINICAL DATA:  Abdominal distention. EXAM: CT ABDOMEN AND PELVIS WITHOUT CONTRAST TECHNIQUE: Multidetector CT imaging of the abdomen and pelvis was performed following the standard protocol without IV contrast. COMPARISON:  Chest CT earlier this day. FINDINGS: Breathing motion artifact in the upper abdomen. Lower chest: Motion through the lower chest. Right-sided pulmonary opacity is partially included. The heart is enlarged. Hepatobiliary: No focal lesion allowing for lack contrast. Appears prominent size. Gallbladder physiologically distended, no calcified stone. No biliary dilatation. Pancreas: No ductal dilatation or gross peripancreatic inflammation, motion limited evaluation. Spleen: Normal in size without focal abnormality. Adrenals/Urinary Tract: No adrenal nodule. No hydronephrosis. Right kidney slightly ptopic. Detailed evaluation limited by motion. Probable cyst in the lower right kidney. Urinary bladder is physiologically distended. Stomach/Bowel: Motion artifact limits  assessment. Stomach is decompressed. No small bowel dilatation to suggest obstruction. Moderate stool burden throughout the ascending and transverse colon. Tortuous sigmoid colon that is partially obscured. Normal appendix. Questionable edema in the lower mesentery. No evidence of associated bowel inflammation. Vascular/Lymphatic: Abdominal aortic atherosclerosis. No aneurysm. Retroperitoneal phleboliths. Small mesenteric nodes not enlarged by size criteria. Reproductive: Status post hysterectomy. No adnexal masses. Other: No ascites or free air. Small fat containing umbilical hernia. Musculoskeletal: There are no acute or suspicious osseous abnormalities. Degenerative change throughout spine and both sacroiliac joints. IMPRESSION: 1. No definite acute abnormality in abdomen/pelvis. Moderate stool burden in the proximal colon can be seen with constipation. 2. Equivocal lower mesenteric edema without associated bowel  abnormality. This is likely systemic etiology and can be seen with third-spacing. 3. Aortic atherosclerosis. Electronically Signed   By: Jeb Levering M.D.   On: 12/08/2016 03:47   Dg Chest 2 View  Result Date: 12/07/2016 CLINICAL DATA:  Chronic shortness of breath and productive cough. Initial encounter. EXAM: CHEST  2 VIEW COMPARISON:  Chest radiograph performed 06/05/2016, and CT of the chest performed 07/02/2016 FINDINGS: The lungs are well-aerated. Dense right-sided airspace opacity raises concern for pneumonia. There is no evidence of pleural effusion or pneumothorax. The heart is mildly enlarged. No acute osseous abnormalities are seen. IMPRESSION: 1. Dense right-sided pneumonia. Followup PA and lateral chest X-ray is recommended in 3-4 weeks following trial of antibiotic therapy to ensure resolution and exclude underlying malignancy. 2. Mild cardiomegaly. Electronically Signed   By: Garald Balding M.D.   On: 12/07/2016 21:44   Ct Chest Wo Contrast  Result Date: 12/08/2016 CLINICAL DATA:  Acute onset of productive cough and generalized weakness. Rhinorrhea, congestion and shortness of breath. Chills. Initial encounter. EXAM: CT CHEST WITHOUT CONTRAST TECHNIQUE: Multidetector CT imaging of the chest was performed following the standard protocol without IV contrast. COMPARISON:  Chest radiograph performed 12/07/2016, and CT of the chest performed 07/02/2016 FINDINGS: Cardiovascular: The heart is mildly enlarged. Mild coronary artery calcification is seen. Mild calcification is seen along the aortic arch. The great vessels are grossly unremarkable in appearance. Mediastinum/Nodes: A mildly enlarged 1.3 cm right paratracheal node is seen. No definite additional mediastinal lymphadenopathy is seen. Trace pericardial fluid remains within normal limits. The thyroid gland is unremarkable. No axillary lymphadenopathy is seen. Lungs/Pleura: Dense right-sided airspace opacification is compatible with  pneumonia, mildly worsened from the recent prior chest radiograph. Mild hazy opacity is also seen at the left lung apex, new from prior CT and concerning for pneumonia. No pleural effusion or pneumothorax is seen. No dominant mass is identified. Previously noted upper lobe nodular opacities are obscured by the patient's pneumonia. Upper Abdomen: The visualized portions of the liver and the spleen are unremarkable in appearance. The visualized portions of the pancreas, adrenal glands and kidneys are within normal limits. Musculoskeletal: No acute osseous abnormalities are identified. The visualized musculature is unremarkable in appearance. IMPRESSION: 1. Dense right-sided airspace opacification is compatible with pneumonia, mildly worsened from the recent prior chest radiograph. Mild hazy opacity at the left lung apex is also concerning for pneumonia. 2. 1.3 cm right paratracheal node may reflect the underlying infection. 3. Mild cardiomegaly.  Mild coronary artery calcifications seen. Electronically Signed   By: Garald Balding M.D.   On: 12/08/2016 00:58   US Renal  Result Date: 12/08/2016 CLINICAL DATA:  Acute kidney injury. History of hypertension and hypercholesterolemia. EXAM: RENAL / URINARY TRACT ULTRASOUND COMPLETE COMPARISON:  None. FINDINGS: Right Kidney: Length: 10.4 cm.  Echogenic parenchyma. No mass or hydronephrosis visualized. Left Kidney: Length: 14.4 cm. Echogenic parenchyma. No mass or hydronephrosis visualized. Bladder: Appears normal for degree of bladder distention. IMPRESSION: Echogenic kidneys consistent with medical renal disease. RIGHT kidney measures smaller than the LEFT which may be technical or, reflect atrophy. Electronically Signed   By: Elon Alas M.D.   On: 12/08/2016 01:39   Chest CT: Fairly dense infiltrate of RUL and RML on chest CT with areas of groundglass in RLL and left apex.  ASSESSMENT / PLAN:  Granulomatosis with Polyangiitis (GPA, formerly Wegener's  Granulomatosis) with pulmonary hemorrhage - ANCA sent - Anti-GBM, ANA, complement also sent May warrant bronchoscopy, NPO at midnight. Is not expressly necessary for diagnosis, but DAH would be made immediately whereas renal bx would take a few days to result. Should be treated, but reasonable to wait 12-24 hours to obtain diagnostic information (kidney >> lung bx). By the evening of 3/31, she should be treated with high dose steroids (500 mg of solumedrol, and consider rituximab. Will need early consult to nephrology in the morning. Consider referral to quaternary care center (I.e. [facility]) for ANCA vasculitis tx.   Luz Brazen, MD Pulmonary and [place] Pager: [phone]   12/08/2016, 4:14 AM

## 2016-12-08 NOTE — Progress Notes (Signed)
CRITICAL VALUE ALERT  Critical value received:  Hgb 5.3 Date of notification: 12/08/16  Time of notification: 0928  Critical value read back: Yes  Nurse who received alert: Polly Cobia RN   MD notified (1st page):  MD Chase Caller   Time of first page: MD on unit   MD notified (2nd page):  Time of second page:  Responding MD: MD on unit   Time MD responded: Responded at 0930 1 unit of PRBCs ordered

## 2016-12-08 NOTE — Progress Notes (Signed)
PULMONARY  / CRITICAL CARE MEDICINE CONSULTATION   Name: Catherine Aguilar MRN: [id number] DOB: 1956-07-18    ADMISSION DATE:  12/07/2016 CONSULTATION DATE: December 08, 2016  REQUESTING CLINICIAN: Cherene Altes, MD PRIMARY SERVICE: [facility]  CHIEF COMPLAINT:  Hemoptysis   HISTORY OF PRESENT ILLNESS:   61 y/o woman with substance use hx who presents to ED with hemoptysis worsening over several weeks and dyspnea. She was found to be in significant renal impairment and with infiltrate on chest imaging. She also was found to be in renal impairment. She also a prior history of stroke. She denies sick contacts, and does not have significant sputum production. She presented to the ED 4-6 weeks ago complaining of epistaxis which was attributed to the weather.    SUBJECTIVE: In distress will move to unit , may need OTT. Monitor for ETOH wd.  VITAL SIGNS: Temp:  [98.1 F (36.7 C)-99.8 F (37.7 C)] 98.1 F (36.7 C) (03/31 0751) Pulse Rate:  [101-128] 101 (03/31 0751) Resp:  [15-28] 15 (03/31 0751) BP: (125-176)/(61-98) 176/98 (03/31 0718) SpO2:  [86 %-100 %] 100 % (03/31 0718) Weight:  [95.4 kg (210 lb 5.1 oz)-101.2 kg (223 lb)] 95.4 kg (210 lb 5.1 oz) (03/31 0420) HEMODYNAMICS:   VENTILATOR SETTINGS:   INTAKE / OUTPUT: Intake/Output      03/30 0701 - 03/31 0700 03/31 0701 - 04/01 0700   I.V. (mL/kg)  323.3 (3.4)   IV Piggyback 2600    Total Intake(mL/kg) 2600 (27.3) 323.3 (3.4)   Net +2600 +323.3          PHYSICAL EXAMINATION: General:  Obese woman withsharpincrease in respiratory effort, on 100% nrb, copious hemotysis Neuro:  Awake, alert, in obvious distress HEENT:  MMM Neck: No masses Cardiovascular: HSR RRR Lungs:  +VCD, rhonchi and wheezes Abdomen:  Obese, but soft, +bs Musculoskeletal:  No deformities Skin: WDI  LABS:  CBC  Recent Labs Lab 12/07/16 2313 12/08/16 0122  WBC 10.8* 11.1*  HGB 6.1* 5.6*  HCT 19.0* 17.0*  PLT 512* 454*    Coag's  Recent Labs Lab 12/08/16 0001  APTT 44*  INR 1.21   BMET  Recent Labs Lab 12/07/16 2313  NA 135  K 4.3  CL 110  CO2 14*  BUN 77*  CREATININE 4.41*  GLUCOSE 189*   Electrolytes  Recent Labs Lab 12/07/16 2313  CALCIUM 8.9   Sepsis Markers  Recent Labs Lab 12/08/16 0122 12/08/16 0143  LATICACIDVEN  --  1.1  PROCALCITON 2.66  --    ABG  Recent Labs Lab 12/08/16 0130  PHART 7.258*  PCO2ART 28.7*  PO2ART 93.0   Liver Enzymes  Recent Labs Lab 12/08/16 0001  AST 36  ALT 30  ALKPHOS 79  BILITOT 0.5  ALBUMIN 2.9*   Cardiac Enzymes No results for input(s): TROPONINI, PROBNP in the last 168 hours. Glucose  Recent Labs Lab 12/08/16 0445  GLUCAP 285*    Imaging Ct Abdomen Pelvis Wo Contrast  Result Date: 12/08/2016 CLINICAL DATA:  Abdominal distention. EXAM: CT ABDOMEN AND PELVIS WITHOUT CONTRAST TECHNIQUE: Multidetector CT imaging of the abdomen and pelvis was performed following the standard protocol without IV contrast. COMPARISON:  Chest CT earlier this day. FINDINGS: Breathing motion artifact in the upper abdomen. Lower chest: Motion through the lower chest. Right-sided pulmonary opacity is partially included. The heart is enlarged. Hepatobiliary: No focal lesion allowing for lack contrast. Appears prominent size. Gallbladder physiologically distended, no calcified stone. No biliary dilatation. Pancreas: No ductal dilatation or  gross peripancreatic inflammation, motion limited evaluation. Spleen: Normal in size without focal abnormality. Adrenals/Urinary Tract: No adrenal nodule. No hydronephrosis. Right kidney slightly ptopic. Detailed evaluation limited by motion. Probable cyst in the lower right kidney. Urinary bladder is physiologically distended. Stomach/Bowel: Motion artifact limits assessment. Stomach is decompressed. No small bowel dilatation to suggest obstruction. Moderate stool burden throughout the ascending and transverse colon.  Tortuous sigmoid colon that is partially obscured. Normal appendix. Questionable edema in the lower mesentery. No evidence of associated bowel inflammation. Vascular/Lymphatic: Abdominal aortic atherosclerosis. No aneurysm. Retroperitoneal phleboliths. Small mesenteric nodes not enlarged by size criteria. Reproductive: Status post hysterectomy. No adnexal masses. Other: No ascites or free air. Small fat containing umbilical hernia. Musculoskeletal: There are no acute or suspicious osseous abnormalities. Degenerative change throughout spine and both sacroiliac joints. IMPRESSION: 1. No definite acute abnormality in abdomen/pelvis. Moderate stool burden in the proximal colon can be seen with constipation. 2. Equivocal lower mesenteric edema without associated bowel abnormality. This is likely systemic etiology and can be seen with third-spacing. 3. Aortic atherosclerosis. Electronically Signed   By: Jeb Levering M.D.   On: 12/08/2016 03:47   Dg Chest 2 View  Result Date: 12/07/2016 CLINICAL DATA:  Chronic shortness of breath and productive cough. Initial encounter. EXAM: CHEST  2 VIEW COMPARISON:  Chest radiograph performed 06/05/2016, and CT of the chest performed 07/02/2016 FINDINGS: The lungs are well-aerated. Dense right-sided airspace opacity raises concern for pneumonia. There is no evidence of pleural effusion or pneumothorax. The heart is mildly enlarged. No acute osseous abnormalities are seen. IMPRESSION: 1. Dense right-sided pneumonia. Followup PA and lateral chest X-ray is recommended in 3-4 weeks following trial of antibiotic therapy to ensure resolution and exclude underlying malignancy. 2. Mild cardiomegaly. Electronically Signed   By: Garald Balding M.D.   On: 12/07/2016 21:44   Ct Chest Wo Contrast  Result Date: 12/08/2016 CLINICAL DATA:  Acute onset of productive cough and generalized weakness. Rhinorrhea, congestion and shortness of breath. Chills. Initial encounter. EXAM: CT CHEST  WITHOUT CONTRAST TECHNIQUE: Multidetector CT imaging of the chest was performed following the standard protocol without IV contrast. COMPARISON:  Chest radiograph performed 12/07/2016, and CT of the chest performed 07/02/2016 FINDINGS: Cardiovascular: The heart is mildly enlarged. Mild coronary artery calcification is seen. Mild calcification is seen along the aortic arch. The great vessels are grossly unremarkable in appearance. Mediastinum/Nodes: A mildly enlarged 1.3 cm right paratracheal node is seen. No definite additional mediastinal lymphadenopathy is seen. Trace pericardial fluid remains within normal limits. The thyroid gland is unremarkable. No axillary lymphadenopathy is seen. Lungs/Pleura: Dense right-sided airspace opacification is compatible with pneumonia, mildly worsened from the recent prior chest radiograph. Mild hazy opacity is also seen at the left lung apex, new from prior CT and concerning for pneumonia. No pleural effusion or pneumothorax is seen. No dominant mass is identified. Previously noted upper lobe nodular opacities are obscured by the patient's pneumonia. Upper Abdomen: The visualized portions of the liver and the spleen are unremarkable in appearance. The visualized portions of the pancreas, adrenal glands and kidneys are within normal limits. Musculoskeletal: No acute osseous abnormalities are identified. The visualized musculature is unremarkable in appearance. IMPRESSION: 1. Dense right-sided airspace opacification is compatible with pneumonia, mildly worsened from the recent prior chest radiograph. Mild hazy opacity at the left lung apex is also concerning for pneumonia. 2. 1.3 cm right paratracheal node may reflect the underlying infection. 3. Mild cardiomegaly.  Mild coronary artery calcifications seen. Electronically Signed  By: Garald Balding M.D.   On: 12/08/2016 00:58   US Renal  Result Date: 12/08/2016 CLINICAL DATA:  Acute kidney injury. History of hypertension and  hypercholesterolemia. EXAM: RENAL / URINARY TRACT ULTRASOUND COMPLETE COMPARISON:  None. FINDINGS: Right Kidney: Length: 10.4 cm. Echogenic parenchyma. No mass or hydronephrosis visualized. Left Kidney: Length: 14.4 cm. Echogenic parenchyma. No mass or hydronephrosis visualized. Bladder: Appears normal for degree of bladder distention. IMPRESSION: Echogenic kidneys consistent with medical renal disease. RIGHT kidney measures smaller than the LEFT which may be technical or, reflect atrophy. Electronically Signed   By: Elon Alas M.D.   On: 12/08/2016 01:39   Chest CT: Fairly dense infiltrate of RUL and RML on chest CT with areas of groundglass in RLL and left apex.  ASSESSMENT / PLAN:  Granulomatosis with Polyangiitis (GPA, formerly Wegener's Granulomatosis) with pulmonary hemorrhage - ANCA sent - Anti-GBM, ANA, complement also sent May warrant bronchoscopy, NPO at midnight. Is not expressly necessary for diagnosis, but DAH would be made immediately whereas renal bx would take a few days to result. Should be treated, but reasonable to wait 12-24 hours to obtain diagnostic information (kidney >> lung bx). By the evening of 3/31, she should be treated with high dose steroids (500 mg of solumedrol, and consider rituximab. Will need early consult to nephrology in the morning. Consider referral to [facility] care center ([facility]) for ANCA vasculitis tx.  - 3/31 tx to ICU for higher level monitoring -FIO2 as needed, may need intubation -steroids -Abx for pna -Stop smoking tobacco and marijuana   -Renal consult called  -Transfuse for Hgb <8.4  -Thiamine / folic acid for etoh abuse  -Treat HTN as needed      App cct 45 min   Richardson Landry Minor ACNP Maryanna Shape PCCM Pager [phone] till 3 pm If no answer page [phone] 12/08/2016, 8:27 AM

## 2016-12-08 NOTE — Progress Notes (Signed)
Rituxan infusion increased to 61.1ml/hr. No reported adverse reactions, none observed. Vitals are: 136/80, P-93, R-14, 100%. [employer]

## 2016-12-08 NOTE — ED Notes (Signed)
[facility] called and was given report on pt.

## 2016-12-08 NOTE — ED Notes (Signed)
Per Dr. Ancil Linsey: hold pt here at ED pending facility placement [facility] or [facility]).

## 2016-12-08 NOTE — Progress Notes (Signed)
Rituxan side effects reviewed with the patient and education material was left a the bedside. Patietn states she understands what she needs to report to the nurse. Reviewed emergency response with Joylene Igo RN. Rituxan infusion started initial vitals are 153/84, P-93, R-20, 100%. [employer]ina [employer]

## 2016-12-08 NOTE — Assessment & Plan Note (Addendum)
BP high but doubt this presentation is all due to high bp Serial trop negative  Plan await echo Lasix Control bp with hydralaizine prn

## 2016-12-08 NOTE — Assessment & Plan Note (Addendum)
Features c/w severe systemic Vasculitis with BVASC score of 23 based on renal, pumonary, skin inviolvement and sinus symptopms and headache at admit 12/07/2016   On 12/08/16 - started 1g  Solumedrol per day and week 1 of 4 rituxan  On 12/09/2016 - blood panel stil pending   Plan - await autoimmune and vasculitis panel  - continue high dose solumedrol - - continue Weekly Ritxuan 4 doses; 12/15/16, 12/22/16 and 12/29/16 - await G6PD and if normal start 1DS bactrim MWF - renal for biopsy week of 12/10/16 if needed - dc van - no evidence of infection  -continue zosyn for 12/09/2016 and depending on autoimmune panel dc zosyn - OPD fu with Dr Chase Caller

## 2016-12-08 NOTE — Progress Notes (Signed)
Rituxan rate increased to 61.8 for 66min. Patient does report any adverse reactions, IV site remains in tact. Vitals are: 170/96, P-91, R-14, 100%. [employer]ina Pizza

## 2016-12-08 NOTE — Assessment & Plan Note (Signed)
Tobacco and MJ +  Plan Needs to quit

## 2016-12-08 NOTE — Assessment & Plan Note (Addendum)
Due to alv hge at admit 12/07/2016 - On 12/09/2016 improved but still bipap dependent  Plan - continue bipap  - intubate if worse

## 2016-12-08 NOTE — Progress Notes (Signed)
Called by bedside RN to assess patient. Per bedside RN, patient is experiencing shortness of breath, difficulty breathing, and chest pain.  RN was assisting patient from [facility] when this started.  Upon arrival, patient was in respiratory distress, she was short of breath, RR was elevated, diminished air movement in left and good air movement on the right side, patient still experiencing + hemoptysis.  Oxygen saturations were in the low 80s when I arrived, patient was placed on NRB 15L and RT administered Atrovent and Xopenox and sats improved to 100%. EKG was normal and patient denied CP upon my assessment.  I spoke with Dr. Myna Hidalgo and updated, per MD, I notified CCM MD in [place], per CCM MD in [facility], Steele MD for day shift will come assess patient this AM.  BP and HR were normal, + pulses, patient was alert and talking.  I called Dr. Myna Hidalgo and updated that CCM will come assess patient as soon as they arrive.   Will follow

## 2016-12-08 NOTE — ED Notes (Signed)
[facility] here to transport pt to [facility].

## 2016-12-08 NOTE — Assessment & Plan Note (Addendum)
Classic CT but focal R > L with associated hgb drop and hypoxemia. Needed prb 12/07/16 and 12/09/16  Plan - PRBC for hgb </= 6.9gm%    - exceptions are   -  if ACS susepcted/confirmed then transfuse for hgb </= 8.0gm%,  or    -  active bleeding with hemodynamic instability, then transfuse regardless of hemoglobin value   At at all times try to transfuse 1 unit prbc as possible with exception of active hemorrhage  - Rx vasculitis

## 2016-12-08 NOTE — H&P (Addendum)
History and Physical    Catherine Aguilar JSH:[id number] DOB: Sep 08, 1956 DOA: 12/07/2016  Referring MD/NP/PA:   PCP: Maggie Font, MD   Patient coming from:  The patient is coming from home.  At baseline, pt is independent for most of ADL.   Chief Complaint: SOB, hemoptysis and chest pain, fever, chills  HPI: Catherine Aguilar is a 61 y.o. female with medical history significant of hypertension, hyperlipidemia, stroke, tobacco abuse, marijuana abuse, chronic bronchitis, who presents with shortness breath, hemoptysis and chest pain.  Patient states that he has been having cough, shortness breath, intermittent hemoptysis for almost one month. She reports that she coughs up yellow colored sputum. Her chest pain is located in the mid of the chest, constant, 10 out of 10 in severity, nonradiating. It is aggravated by deep breath and coughing. She can barely speak in full sentence. She states that she coughs up bright colored blood intermittently, each time about a teaspoon in amount. Patient has dizziness and generalized weakness, but no unilateral weakness or numbness in extremities. She also has a fever and chills. She denies nausea, vomiting, diarrhea, abdominal pain, symptoms of UTI. She states that she's abdomen is distended, but not hurting.  ED Course: pt was found to have hemoglobin dropped from 11.3 on 06/05/16-->6.1 today, WBC 10.8, acute renal injury with creatinine 4.41, temperature 99.8, tachycardia, tachypnea, oxygen saturation 86% on room air. Chest x-ray showed right sided pneumonia.   # CT of the chest without contrast showed 1. dense right-sided airspace opacification is compatible with pneumonia, mildly worsened from the recent prior chest radiograph. Mild hazy opacity at the left lung apex is also concerning for pneumonia. 2. 1.3 cm right paratracheal node may reflect the underlying infection. Pt is admitted to SDU as inpt. PCCM, Dr. Ancil Linsey was consulted.  Review of Systems:    General: has fevers, chills, no changes in body weight, has fatigue HEENT: no blurry vision, hearing changes or sore throat Respiratory: has dyspnea, coughing, no wheezing CV: no chest pain, no palpitations GI: no nausea, vomiting, abdominal pain, diarrhea, has constipation and abdominal distention  GU: no dysuria, burning on urination, increased urinary frequency, hematuria  Ext: no leg edema Neuro: no unilateral weakness, numbness, or tingling, no vision change or hearing loss Skin: no rash, no skin tear. MSK: No muscle spasm, no deformity, no limitation of range of movement in spin Heme: No easy bruising.  Travel history: No recent long distant travel.  Allergy: No Known Allergies  Past Medical History:  Diagnosis Date  . High cholesterol   . Hypertension    stopped taking medications 10 years ago  . Stroke [facility])     Past Surgical History:  Procedure Laterality Date  . ABDOMINAL HYSTERECTOMY  1985    Social History:  reports that she has been smoking Cigarettes.  She has a 20.00 pack-year smoking history. She has never used smokeless tobacco. She reports that she drinks alcohol. She reports that she uses drugs, including Marijuana.  Family History:  Family History  Problem Relation Age of Onset  . Cancer Father   . Heart attack Mother      Prior to Admission medications   Medication Sig Start Date End Date Taking? Authorizing Provider  aspirin 325 MG tablet Take 1 tablet (325 mg total) by mouth daily. 07/07/12  Yes Ivan Anchors Love, PA-C  fluticasone (FLONASE) 50 MCG/ACT nasal spray Place 1 spray into the nose daily as needed for congestion. 10/10/16  Yes Historical Provider, MD  gabapentin (  NEURONTIN) 300 MG capsule Take 1 capsule (300 mg total) by mouth 2 (two) times daily. For numbness left side Patient taking differently: Take 600 mg by mouth 2 (two) times daily. 300 mg in the morning 900 mg in the evening 07/07/12  Yes Ivan Anchors Love, PA-C  hydrALAZINE (APRESOLINE)  50 MG tablet Take 50 mg by mouth 3 (three) times daily. 11/01/16  Yes Historical Provider, MD  indomethacin (INDOCIN) 25 MG capsule Take 1 capsule (25 mg total) by mouth 3 (three) times daily as needed. Patient taking differently: Take 25 mg by mouth 3 (three) times daily with meals.  09/11/15  Yes Antonietta Breach, PA-C  lisinopril (PRINIVIL) 10 MG tablet Take 1 tablet (10 mg total) by mouth daily. 08/04/12  Yes Meredith Staggers, MD  loratadine (CLARITIN) 10 MG tablet TAKE ONE TABLET BY MOUTH EVERY DAY FOR NASAL CONGESTION 09/19/16  Yes Historical Provider, MD  losartan (COZAAR) 100 MG tablet Take 100 mg by mouth daily. 05/08/16  Yes Historical Provider, MD  metFORMIN (GLUCOPHAGE) 500 MG tablet TAKE 500 MG BY MOUTH AT BREAKFAST AND SUPPER 09/01/16  Yes Historical Provider, MD  pravastatin (PRAVACHOL) 40 MG tablet Take 1 tablet (40 mg total) by mouth daily. For elevated cholesterol 07/07/12  Yes Ivan Anchors Love, PA-C  PROVENTIL HFA 108 (90 Base) MCG/ACT inhaler INHALE 2 PUFFS EVERY 4-6 HOURS PRN FOR SOB AND WHEEZING 09/01/16  Yes Historical Provider, MD  sodium chloride (OCEAN) 0.65 % SOLN nasal spray Place 1 spray into both nostrils 3 (three) times daily.   Yes Historical Provider, MD  azithromycin (ZITHROMAX) 250 MG tablet Take 1 tablet (250 mg total) by mouth daily. Take 1 every day until finished. Received her first dose in the ED. Take your first dose on Monday, 10/23/2005 Patient not taking: Reported on 06/05/2016 10/24/15   Ottie Glazier, PA-C  fluticasone (FLONASE) 50 MCG/ACT nasal spray Place 2 sprays into both nostrils daily. Patient not taking: Reported on 10/22/2016 10/10/16 10/24/16  Duffy Bruce, MD  hydrALAZINE (APRESOLINE) 25 MG tablet Take 2 tablets (50 mg total) by mouth 3 (three) times daily. Patient not taking: Reported on 12/07/2016 08/04/12   Meredith Staggers, MD  mupirocin nasal ointment Drue Stager) 2 % Apply in each nostril daily for 10 days Patient not taking: Reported on 10/22/2016  10/10/16   Duffy Bruce, MD    Physical Exam: Vitals:   12/08/16 0000 12/08/16 0100 12/08/16 0142 12/08/16 0145  BP: 136/82 125/61    Pulse: (!) 117 (!) 114    Resp:  (!) 25    Temp:      TempSrc:      SpO2: 95% 97% 96%   Weight:    101.2 kg (223 lb)  Height:    _0  (1.753 m)   General: pt is in acute distress HEENT:       Eyes: PERRL, EOMI, no scleral icterus. Eyes are puffy       ENT: No discharge from the ears and nose, no pharynx injection, no tonsillar enlargement.        Neck: No JVD, no bruit, no mass felt. Heme: No neck lymph node enlargement. Cardiac: S1/S2, RRR, No murmurs, No gallops or rubs. Respiratory: has rhonchi bilaterally.  GI: has distention. Nontender, no rebound pain, no organomegaly, BS present. GU: No hematuria Ext: No pitting leg edema bilaterally. 2+DP/PT pulse bilaterally. Musculoskeletal: No joint deformities, No joint redness or warmth, no limitation of ROM in spin. Skin: No rashes.  Neuro: Alert, oriented X3,  cranial nerves II-XII grossly intact, moves all extremities normally.  Psych: Patient is not psychotic, no suicidal or hemocidal ideation.  Labs on Admission: I have personally reviewed following labs and imaging studies  CBC:  Recent Labs Lab 12/07/16 2313 12/08/16 0122  WBC 10.8* 11.1*  NEUTROABS 8.0*  --   HGB 6.1* 5.6*  HCT 19.0* 17.0*  MCV 75.4* 75.9*  PLT 512* 920*   Basic Metabolic Panel:  Recent Labs Lab 12/07/16 2313  NA 135  K 4.3  CL 110  CO2 14*  GLUCOSE 189*  BUN 77*  CREATININE 4.41*  CALCIUM 8.9   GFR: Estimated Creatinine Clearance: 17 mL/min (A) (by C-G formula based on SCr of 4.41 mg/dL (H)). Liver Function Tests:  Recent Labs Lab 12/08/16 0001  AST 36  ALT 30  ALKPHOS 79  BILITOT 0.5  PROT 7.6  ALBUMIN 2.9*   No results for input(s): LIPASE, AMYLASE in the last 168 hours. No results for input(s): AMMONIA in the last 168 hours. Coagulation Profile:  Recent Labs Lab 12/08/16 0001   INR 1.21   Cardiac Enzymes: No results for input(s): CKTOTAL, CKMB, CKMBINDEX, TROPONINI in the last 168 hours. BNP (last 3 results) No results for input(s): PROBNP in the last 8760 hours. HbA1C: No results for input(s): HGBA1C in the last 72 hours. CBG: No results for input(s): GLUCAP in the last 168 hours. Lipid Profile: No results for input(s): CHOL, HDL, LDLCALC, TRIG, CHOLHDL, LDLDIRECT in the last 72 hours. Thyroid Function Tests: No results for input(s): TSH, T4TOTAL, FREET4, T3FREE, THYROIDAB in the last 72 hours. Anemia Panel:  Recent Labs  12/08/16 0001  RETICCTPCT 2.2   Urine analysis:    Component Value Date/Time   COLORURINE AMBER (A) 10/23/2015 1029   APPEARANCEUR HAZY (A) 10/23/2015 1029   LABSPEC 1.036 (H) 10/23/2015 1029   PHURINE 5.0 10/23/2015 1029   GLUCOSEU NEGATIVE 10/23/2015 1029   HGBUR NEGATIVE 10/23/2015 1029   BILIRUBINUR SMALL (A) 10/23/2015 1029   KETONESUR 15 (A) 10/23/2015 1029   PROTEINUR NEGATIVE 10/23/2015 1029   NITRITE NEGATIVE 10/23/2015 1029   LEUKOCYTESUR NEGATIVE 10/23/2015 1029   Sepsis Labs: _0 (procalcitonin:4,lacticidven:4) )No results found for this or any previous visit (from the past 240 hour(s)).   Radiological Exams on Admission: Dg Chest 2 View  Result Date: 12/07/2016 CLINICAL DATA:  Chronic shortness of breath and productive cough. Initial encounter. EXAM: CHEST  2 VIEW COMPARISON:  Chest radiograph performed 06/05/2016, and CT of the chest performed 07/02/2016 FINDINGS: The lungs are well-aerated. Dense right-sided airspace opacity raises concern for pneumonia. There is no evidence of pleural effusion or pneumothorax. The heart is mildly enlarged. No acute osseous abnormalities are seen. IMPRESSION: 1. Dense right-sided pneumonia. Followup PA and lateral chest X-ray is recommended in 3-4 weeks following trial of antibiotic therapy to ensure resolution and exclude underlying malignancy. 2. Mild cardiomegaly.  Electronically Signed   By: Garald Balding M.D.   On: 12/07/2016 21:44   Ct Chest Wo Contrast  Result Date: 12/08/2016 CLINICAL DATA:  Acute onset of productive cough and generalized weakness. Rhinorrhea, congestion and shortness of breath. Chills. Initial encounter. EXAM: CT CHEST WITHOUT CONTRAST TECHNIQUE: Multidetector CT imaging of the chest was performed following the standard protocol without IV contrast. COMPARISON:  Chest radiograph performed 12/07/2016, and CT of the chest performed 07/02/2016 FINDINGS: Cardiovascular: The heart is mildly enlarged. Mild coronary artery calcification is seen. Mild calcification is seen along the aortic arch. The great vessels are grossly unremarkable in appearance. Mediastinum/Nodes:  A mildly enlarged 1.3 cm right paratracheal node is seen. No definite additional mediastinal lymphadenopathy is seen. Trace pericardial fluid remains within normal limits. The thyroid gland is unremarkable. No axillary lymphadenopathy is seen. Lungs/Pleura: Dense right-sided airspace opacification is compatible with pneumonia, mildly worsened from the recent prior chest radiograph. Mild hazy opacity is also seen at the left lung apex, new from prior CT and concerning for pneumonia. No pleural effusion or pneumothorax is seen. No dominant mass is identified. Previously noted upper lobe nodular opacities are obscured by the patient's pneumonia. Upper Abdomen: The visualized portions of the liver and the spleen are unremarkable in appearance. The visualized portions of the pancreas, adrenal glands and kidneys are within normal limits. Musculoskeletal: No acute osseous abnormalities are identified. The visualized musculature is unremarkable in appearance. IMPRESSION: 1. Dense right-sided airspace opacification is compatible with pneumonia, mildly worsened from the recent prior chest radiograph. Mild hazy opacity at the left lung apex is also concerning for pneumonia. 2. 1.3 cm right  paratracheal node may reflect the underlying infection. 3. Mild cardiomegaly.  Mild coronary artery calcifications seen. Electronically Signed   By: Garald Balding M.D.   On: 12/08/2016 00:58   US Renal  Result Date: 12/08/2016 CLINICAL DATA:  Acute kidney injury. History of hypertension and hypercholesterolemia. EXAM: RENAL / URINARY TRACT ULTRASOUND COMPLETE COMPARISON:  None. FINDINGS: Right Kidney: Length: 10.4 cm. Echogenic parenchyma. No mass or hydronephrosis visualized. Left Kidney: Length: 14.4 cm. Echogenic parenchyma. No mass or hydronephrosis visualized. Bladder: Appears normal for degree of bladder distention. IMPRESSION: Echogenic kidneys consistent with medical renal disease. RIGHT kidney measures smaller than the LEFT which may be technical or, reflect atrophy. Electronically Signed   By: Elon Alas M.D.   On: 12/08/2016 01:39     EKG: Independently reviewed. Sinus rhythm, tachycardia, QTC 445, nonspecific T-wave change   Assessment/Plan Principal Problem:   Acute respiratory failure with hypoxia (HCC) Active Problems:   CVA (cerebral vascular accident) ([place])   Essential hypertension   HLD (hyperlipidemia)   Tobacco abuse   AKI (acute kidney injury) ([place])   Sepsis ([place])   Hemoptysis   CAP (community acquired pneumonia)   Symptomatic anemia   Wegener's granulomatosis with renal involvement ([place])   Acute respiratory failure with hypoxia [facility]): Chest x-ray and the CT scan of chest showed possible PNA. Pt also has hemoptysis and acute renal injury, indicating possible with Levan Hurst granulomatosis per PCCM, Dr. Ancil Linsey. Pt will will need kidney biopsy tomorrow and will hold steroid until diagnosis is established by biopsy per Dr. Ancil Linsey. Patient's airways is protected now.   -will admit to SDU as inpt -check glomerular basement membrane, C3, C4, CRP, ESR,  PR3 ab, cyroglobuin, ANA, glomerular basement membrane antibody - IV Vancomycin, Zosyn and azithromycin (pt  received one dose of recephine) - Mucinex for cough  - Atrovent nebs and Xopenex Neb prn for SOB - Urine legionella and S. pneumococcal antigen - Follow up blood culture x2, sputum culture and plus Flu pcr -SLP  Sepsis: Patient admitted critically for sepsis with leukocytosis, fever, tachycardia and tachypnea. Lactic acid is pending. Currently hemodynamically stable. - will get Procalcitonin and trend lactic acid level per sepsis protocol - IVF: 2.5 L of NS bolus in ED, followed by 100 mL per hour of NS  -IV abx as above  AKI: Possibly due to patient granulomatosis -will get UA -renal US -hold indomethacin, lisinopril and Cozaar -IVF as above -f/u renal fx by BMP -please consult renal in AM.  Symptomatic anemia: Hemoglobin dropped from 11.3 on 06/05/16-->6.1. Most likely due to hemoptysis. Denies hematuria and hematochezia -check FOBT -check anemia panel -transfuse 2 units of blood -cbc q6h  Hx of CVA: -continue pravastatin -Hold aspirin due to hemoptysis  HTN: bp is 126/98 -hold lisinopril, Cozaar -continue oral hydralazine IV hydralazine when necessary-  HLD (hyperlipidemia): -Continue pravastatin  Tobacco abuse: -Did counseling about importance of quitting smoking -Nicotine patch  Alcohol use: pt states that she drinks alcohol in the weekends. Pt has Acute respiratory distress, is unwilling to give the drinking history. -will start CIWA  -check alcohol level  Abdominal distention: Etiology is not clear. Patient states that she is constipated. -check LFT -MiraLAX and senna code -check CT-abd/pelvis without contrast   DVT ppx: SCD Code Status: Full code Family Communication: None at bed side.  Disposition Plan:  Anticipate discharge back to previous home environment Consults called:  PCCM, Dr. Ancil Linsey was consulted. Admission status: SDU/inpation       Date of Service 12/08/2016    Ivor Costa [facility] Pager [phone]  If 7PM-7AM, please  contact night-coverage [email or web address] Password [email or web address] 12/08/2016, 2:07 AM

## 2016-12-08 NOTE — Consult Note (Signed)
I have seen the patient and believe this case is a fairly typical presntation for granulomatosis with polyangiitis (formerly Wegener's). ANCA serologies ordered. D/w hospitalist - she is protecting her airway and is stable on room air. She does have loud rhonchi, but is able to clear her lungs excellently. I do not believe she warrants critical care, but should be closely monitored in acute care or stepdown.  She should be NPO for renal biopsy. Her initial treatment (she is stable presently that I would favor waiting to start tx until her renal biopsy is performed) should be steroids, with rituximab or cyclophosphamide and/or methotrexate; this tx plan should be made in consultation with nephrology. Alternatively, transfer to [facility] is reasonable as they have a robust ANCA-vasculitis treatment center there, and it would be best for her to follow up there as an outpatient anyway.  Full note to follow.  Luz Brazen, MD Pulmonary & Critical Care Medicine December 08, 2016, 2:10 AM

## 2016-12-08 NOTE — Evaluation (Signed)
SLP Cancellation Note  Patient Details Name: Jacquel Redditt MRN: [id number] DOB: 30-Jan-1956   Cancelled treatment:       Reason Eval/Treat Not Completed: Medical issues which prohibited therapy (pt with respiratory difficulties, on NRB and accessory muscle use - transferring to ICU, will continue efforts)   Claudie Fisherman, [place] [facility] SLP [phone]

## 2016-12-09 ENCOUNTER — Inpatient Hospital Stay (HOSPITAL_COMMUNITY): Payer: Medicaid Other

## 2016-12-09 DIAGNOSIS — N170 Acute kidney failure with tubular necrosis: Secondary | ICD-10-CM

## 2016-12-09 LAB — CREATININE, SERUM
CREATININE: 4.28 mg/dL — AB (ref 0.44–1.00)
GFR, EST AFRICAN AMERICAN: 12 mL/min — AB (ref >60)
GFR, EST NON AFRICAN AMERICAN: 10 mL/min — AB (ref >60)

## 2016-12-09 LAB — GLUCOSE, CAPILLARY
GLUCOSE-CAPILLARY: 151 mg/dL — AB (ref 65–99)
GLUCOSE-CAPILLARY: 169 mg/dL — AB (ref 65–99)
GLUCOSE-CAPILLARY: 175 mg/dL — AB (ref 65–99)
Glucose-Capillary: 173 mg/dL — ABNORMAL HIGH (ref 65–99)
Glucose-Capillary: 164 mg/dL — ABNORMAL HIGH (ref 65–99)
Glucose-Capillary: 144 mg/dL — ABNORMAL HIGH (ref 65–99)

## 2016-12-09 LAB — CBC
HCT: 18.8 % — ABNORMAL LOW (ref 36.0–46.0)
HCT: 23.9 % — ABNORMAL LOW (ref 36.0–46.0)
HEMATOCRIT: 23.5 % — AB (ref 36.0–46.0)
Hemoglobin: 6.2 g/dL — CL (ref 12.0–15.0)
Hemoglobin: 7.8 g/dL — ABNORMAL LOW (ref 12.0–15.0)
Hemoglobin: 7.7 g/dL — ABNORMAL LOW (ref 12.0–15.0)
MCH: 24.4 pg — ABNORMAL LOW (ref 26.0–34.0)
MCH: 24.8 pg — AB (ref 26.0–34.0)
MCH: 24.6 pg — ABNORMAL LOW (ref 26.0–34.0)
MCHC: 33 g/dL (ref 30.0–36.0)
MCHC: 32.6 g/dL (ref 30.0–36.0)
MCHC: 32.8 g/dL (ref 30.0–36.0)
MCV: 74 fL — AB (ref 78.0–100.0)
MCV: 76.1 fL — AB (ref 78.0–100.0)
MCV: 75.1 fL — ABNORMAL LOW (ref 78.0–100.0)
PLATELETS: 421 10*3/uL — AB (ref 150–400)
PLATELETS: 476 10*3/uL — AB (ref 150–400)
PLATELETS: 497 10*3/uL — AB (ref 150–400)
RBC: 2.54 MIL/uL — AB (ref 3.87–5.11)
RBC: 3.14 MIL/uL — AB (ref 3.87–5.11)
RBC: 3.13 MIL/uL — ABNORMAL LOW (ref 3.87–5.11)
RDW: 17.2 % — AB (ref 11.5–15.5)
RDW: 17.8 % — ABNORMAL HIGH (ref 11.5–15.5)
RDW: 17.3 % — AB (ref 11.5–15.5)
WBC: 9.1 10*3/uL (ref 4.0–10.5)
WBC: 10.7 10*3/uL — ABNORMAL HIGH (ref 4.0–10.5)
WBC: 11.6 10*3/uL — ABNORMAL HIGH (ref 4.0–10.5)

## 2016-12-09 LAB — C3 COMPLEMENT: C3 Complement: 128 mg/dL (ref 82–167)

## 2016-12-09 LAB — CYCLIC CITRUL PEPTIDE ANTIBODY, IGG/IGA: CCP ANTIBODIES IGG/IGA: 9 U (ref 0–19)

## 2016-12-09 LAB — TROPONIN I

## 2016-12-09 LAB — RHEUMATOID FACTOR: RHEUMATOID FACTOR: 17.1 [IU]/mL — AB (ref 0.0–13.9)

## 2016-12-09 LAB — C4 COMPLEMENT: Complement C4, Body Fluid: 18 mg/dL (ref 14–44)

## 2016-12-09 LAB — PREPARE RBC (CROSSMATCH)

## 2016-12-09 LAB — PROCALCITONIN: Procalcitonin: 3.9 ng/mL

## 2016-12-09 MED ORDER — SODIUM CHLORIDE 0.9 % IV SOLN
Freq: Once | INTRAVENOUS | Status: AC
  Administered 2016-12-09: 05:00:00 via INTRAVENOUS

## 2016-12-09 NOTE — Progress Notes (Signed)
RT Titrated IPAP to 10 and EPAP to 5 due to high VTs (880).  New VT 640.  RN notified.

## 2016-12-09 NOTE — Progress Notes (Addendum)
PULMONARY / CRITICAL CARE MEDICINE   Name: Catherine Aguilar MRN: [id number] DOB: 1955-12-26 PCP Maggie Font, MD LOS 1 as of 12/09/2016     ADMISSION DATE:  12/07/2016 CONSULTATION DATE:  12/09/16    brief chronic sinusitis nos x 2 years along withupper back skin lesions and headache nos without relief. Now with features of alveolra hemorrage on CT with acute renal failure and urine with RBC numeours. BVASC score of 23 at admit. She smokes tobacco and MJ but  denies cocaine. STarted Iv sterpids and Week 1 of 4 Rituxan 375mg /BSAm2. Vasculitis and autoimmune panel pending 12/09/16 G6PD pending   SUBJECTIVE/OVERNIGHT/INTERVAL HX 12/09/16 - still on bipap and able to come off transiently only. G6pd and autoimmune/vasculitis pending. RVP and urine strep negative. Sp 1 Unit PRBC for alv hge and hgb < 7gm%. Creat some better   VITAL SIGNS: BP (!) 190/92   Pulse 93   Temp 98.1 F (36.7 C) (Axillary)   Resp (!) 24   Ht 5\' 9"  (1.753 m)   Wt 95.4 kg (210 lb 5.1 oz)   SpO2 98%   BMI 31.06 kg/m   HEMODYNAMICS:    VENTILATOR SETTINGS: FiO2 (%):  [30 %-50 %] 30 %  INTAKE / OUTPUT: I/O last 3 completed shifts: In: 4991.8 [P.O.:550; I.V.:423.3; Blood:700; IV Piggyback:3318.5] Out: 4800 [Urine:4800]     EXAM  General Appearance:    Looks criticall ill OBESE - yes but looking better  Head:    Normocephalic, without obvious abnormality, atraumatic  Eyes:    PERRL - yes, conjunctiva/corneas - clear      Ears:    Normal external ear canals, both ears  Nose:   NG tube - no  Throat:  ETT TUBE - no but on bipap , OG tube - no  Neck:   Supple,  No enlargement/tenderness/nodules     Lungs:     Improved crackles and wob   Chest wall:    No deformity  Heart:    S1 and S2 normal, no murmur, CVP - no.  Pressors - no  Abdomen:     Soft, no masses, no organomegaly  Genitalia:    Not done  Rectal:   not done  Extremities:   Extremities- mild edema     Skin:   Intact in exposed areas . Sacral  area - no decube reportd     Neurologic:   Sedation - none -> RASS - -1 . Moves all 4s - yes. CAM-ICU - neg . Orientation -  X 3 +       LABS  PULMONARY  Recent Labs Lab 12/08/16 0130  PHART 7.258*  PCO2ART 28.7*  PO2ART 93.0  HCO3 12.4*  O2SAT 95.1    CBC  Recent Labs Lab 12/08/16 1853 12/09/16 0212 12/09/16 0839  HGB 6.3* 6.2* 7.8*  HCT 19.5* 18.8* 23.9*  WBC 9.5 9.1 10.7*  PLT 413* 421* 476*    COAGULATION  Recent Labs Lab 12/08/16 0001  INR 1.21    CARDIAC   Recent Labs Lab 12/08/16 1520 12/08/16 1851 12/09/16 0212  TROPONINI <0.03 <0.03 <0.03   No results for input(s): PROBNP in the last 168 hours.   CHEMISTRY  Recent Labs Lab 12/07/16 2313 12/08/16 0911 12/09/16 0212  NA 135 136  --   K 4.3 4.7  --   CL 110 111  --   CO2 14* 13*  --   GLUCOSE 189* 187*  --   BUN 77* 65*  --  CREATININE 4.41* 4.26* 4.28*  CALCIUM 8.9 8.4*  --    Estimated Creatinine Clearance: 17 mL/min (A) (by C-G formula based on SCr of 4.28 mg/dL (H)).   LIVER  Recent Labs Lab 12/08/16 0001  AST 36  ALT 30  ALKPHOS 79  BILITOT 0.5  PROT 7.6  ALBUMIN 2.9*  INR 1.21     INFECTIOUS  Recent Labs Lab 12/08/16 0122 12/08/16 0143 12/08/16 0836 12/09/16 0212  LATICACIDVEN  --  1.1  --   --   PROCALCITON 2.66  --  3.33 3.90     ENDOCRINE CBG (last 3)   Recent Labs  12/09/16 0402 12/09/16 0836 12/09/16 1220  GLUCAP 173* 164* 169*         IMAGING x48h  - image(s) personally visualized  -   highlighted in bold Ct Abdomen Pelvis Wo Contrast  Result Date: 12/08/2016 CLINICAL DATA:  Abdominal distention. EXAM: CT ABDOMEN AND PELVIS WITHOUT CONTRAST TECHNIQUE: Multidetector CT imaging of the abdomen and pelvis was performed following the standard protocol without IV contrast. COMPARISON:  Chest CT earlier this day. FINDINGS: Breathing motion artifact in the upper abdomen. Lower chest: Motion through the lower chest. Right-sided pulmonary  opacity is partially included. The heart is enlarged. Hepatobiliary: No focal lesion allowing for lack contrast. Appears prominent size. Gallbladder physiologically distended, no calcified stone. No biliary dilatation. Pancreas: No ductal dilatation or gross peripancreatic inflammation, motion limited evaluation. Spleen: Normal in size without focal abnormality. Adrenals/Urinary Tract: No adrenal nodule. No hydronephrosis. Right kidney slightly ptopic. Detailed evaluation limited by motion. Probable cyst in the lower right kidney. Urinary bladder is physiologically distended. Stomach/Bowel: Motion artifact limits assessment. Stomach is decompressed. No small bowel dilatation to suggest obstruction. Moderate stool burden throughout the ascending and transverse colon. Tortuous sigmoid colon that is partially obscured. Normal appendix. Questionable edema in the lower mesentery. No evidence of associated bowel inflammation. Vascular/Lymphatic: Abdominal aortic atherosclerosis. No aneurysm. Retroperitoneal phleboliths. Small mesenteric nodes not enlarged by size criteria. Reproductive: Status post hysterectomy. No adnexal masses. Other: No ascites or free air. Small fat containing umbilical hernia. Musculoskeletal: There are no acute or suspicious osseous abnormalities. Degenerative change throughout spine and both sacroiliac joints. IMPRESSION: 1. No definite acute abnormality in abdomen/pelvis. Moderate stool burden in the proximal colon can be seen with constipation. 2. Equivocal lower mesenteric edema without associated bowel abnormality. This is likely systemic etiology and can be seen with third-spacing. 3. Aortic atherosclerosis. Electronically Signed   By: Jeb Levering M.D.   On: 12/08/2016 03:47   Dg Chest 2 View  Result Date: 12/07/2016 CLINICAL DATA:  Chronic shortness of breath and productive cough. Initial encounter. EXAM: CHEST  2 VIEW COMPARISON:  Chest radiograph performed 06/05/2016, and CT of  the chest performed 07/02/2016 FINDINGS: The lungs are well-aerated. Dense right-sided airspace opacity raises concern for pneumonia. There is no evidence of pleural effusion or pneumothorax. The heart is mildly enlarged. No acute osseous abnormalities are seen. IMPRESSION: 1. Dense right-sided pneumonia. Followup PA and lateral chest X-ray is recommended in 3-4 weeks following trial of antibiotic therapy to ensure resolution and exclude underlying malignancy. 2. Mild cardiomegaly. Electronically Signed   By: Garald Balding M.D.   On: 12/07/2016 21:44   Ct Head Wo Contrast  Result Date: 12/09/2016 CLINICAL DATA:  Headache.  Vasculitis. EXAM: CT HEAD WITHOUT CONTRAST CT MAXILLOFACIAL WITHOUT CONTRAST TECHNIQUE: Multidetector CT imaging of the head and maxillofacial structures were performed using the standard protocol without intravenous contrast. Multiplanar CT image reconstructions  of the maxillofacial structures were also generated. COMPARISON:  Head CT and brain MRI October 2013 FINDINGS: CT HEAD FINDINGS Brain: No evidence of acute infarction, hemorrhage, hydrocephalus, extra-axial collection or mass lesion/mass effect. Vascular: Atherosclerosis of skullbase vasculature without hyperdense vessel or abnormal calcification. Skull: Normal. Negative for fracture or focal lesion. Mastoid air cells are well-aerated. Other: None. CT MAXILLOFACIAL FINDINGS Osseous: No fracture or bony destructive change. There are multiple missing teeth. No periapical lucencies. Degenerative change of both temporomandibular joints, imaging obtained with open mouth. Orbits: Negative. No traumatic or inflammatory finding. Sinuses: Clear.  No mucosal thickening or fluid levels. Soft tissues: Negative. IMPRESSION: 1.  No acute intracranial abnormality. 2. No acute facial bone abnormality. Mild chronic change at the temporomandibular joints. Multiple absent teeth, chronic. Electronically Signed   By: Jeb Levering M.D.   On: 12/09/2016  01:15   Ct Chest Wo Contrast  Result Date: 12/08/2016 CLINICAL DATA:  Acute onset of productive cough and generalized weakness. Rhinorrhea, congestion and shortness of breath. Chills. Initial encounter. EXAM: CT CHEST WITHOUT CONTRAST TECHNIQUE: Multidetector CT imaging of the chest was performed following the standard protocol without IV contrast. COMPARISON:  Chest radiograph performed 12/07/2016, and CT of the chest performed 07/02/2016 FINDINGS: Cardiovascular: The heart is mildly enlarged. Mild coronary artery calcification is seen. Mild calcification is seen along the aortic arch. The great vessels are grossly unremarkable in appearance. Mediastinum/Nodes: A mildly enlarged 1.3 cm right paratracheal node is seen. No definite additional mediastinal lymphadenopathy is seen. Trace pericardial fluid remains within normal limits. The thyroid gland is unremarkable. No axillary lymphadenopathy is seen. Lungs/Pleura: Dense right-sided airspace opacification is compatible with pneumonia, mildly worsened from the recent prior chest radiograph. Mild hazy opacity is also seen at the left lung apex, new from prior CT and concerning for pneumonia. No pleural effusion or pneumothorax is seen. No dominant mass is identified. Previously noted upper lobe nodular opacities are obscured by the patient's pneumonia. Upper Abdomen: The visualized portions of the liver and the spleen are unremarkable in appearance. The visualized portions of the pancreas, adrenal glands and kidneys are within normal limits. Musculoskeletal: No acute osseous abnormalities are identified. The visualized musculature is unremarkable in appearance. IMPRESSION: 1. Dense right-sided airspace opacification is compatible with pneumonia, mildly worsened from the recent prior chest radiograph. Mild hazy opacity at the left lung apex is also concerning for pneumonia. 2. 1.3 cm right paratracheal node may reflect the underlying infection. 3. Mild  cardiomegaly.  Mild coronary artery calcifications seen. Electronically Signed   By: Garald Balding M.D.   On: 12/08/2016 00:58   US Renal  Result Date: 12/08/2016 CLINICAL DATA:  Acute kidney injury. History of hypertension and hypercholesterolemia. EXAM: RENAL / URINARY TRACT ULTRASOUND COMPLETE COMPARISON:  None. FINDINGS: Right Kidney: Length: 10.4 cm. Echogenic parenchyma. No mass or hydronephrosis visualized. Left Kidney: Length: 14.4 cm. Echogenic parenchyma. No mass or hydronephrosis visualized. Bladder: Appears normal for degree of bladder distention. IMPRESSION: Echogenic kidneys consistent with medical renal disease. RIGHT kidney measures smaller than the LEFT which may be technical or, reflect atrophy. Electronically Signed   By: Elon Alas M.D.   On: 12/08/2016 01:39   Dg Chest Port 1 View  Result Date: 12/09/2016 CLINICAL DATA:  Followup for diffuse pulmonary hemorrhage. EXAM: PORTABLE CHEST 1 VIEW COMPARISON:  Chest CT, 12/08/2016.  Chest radiograph, 12/07/2016. FINDINGS: There is extensive consolidation in the right upper lobe was less dense consolidation in the right lower lobe. A smaller area of consolidation is  noted in the left upper lobe. Findings are stable from the previous day's CT scan. Right lower lobe opacity has mildly increased when compared the prior chest radiograph. No convincing pleural effusion.  No pneumothorax. Heart is mildly enlarged. IMPRESSION: 1. Extensive areas of lung consolidation, mostly on the right, stable from the previous day's chest CT scan. Electronically Signed   By: Lajean Manes M.D.   On: 12/09/2016 11:03   Ct Maxillofacial Wo Contrast  Result Date: 12/09/2016 CLINICAL DATA:  Headache.  Vasculitis. EXAM: CT HEAD WITHOUT CONTRAST CT MAXILLOFACIAL WITHOUT CONTRAST TECHNIQUE: Multidetector CT imaging of the head and maxillofacial structures were performed using the standard protocol without intravenous contrast. Multiplanar CT image reconstructions  of the maxillofacial structures were also generated. COMPARISON:  Head CT and brain MRI October 2013 FINDINGS: CT HEAD FINDINGS Brain: No evidence of acute infarction, hemorrhage, hydrocephalus, extra-axial collection or mass lesion/mass effect. Vascular: Atherosclerosis of skullbase vasculature without hyperdense vessel or abnormal calcification. Skull: Normal. Negative for fracture or focal lesion. Mastoid air cells are well-aerated. Other: None. CT MAXILLOFACIAL FINDINGS Osseous: No fracture or bony destructive change. There are multiple missing teeth. No periapical lucencies. Degenerative change of both temporomandibular joints, imaging obtained with open mouth. Orbits: Negative. No traumatic or inflammatory finding. Sinuses: Clear.  No mucosal thickening or fluid levels. Soft tissues: Negative. IMPRESSION: 1.  No acute intracranial abnormality. 2. No acute facial bone abnormality. Mild chronic change at the temporomandibular joints. Multiple absent teeth, chronic. Electronically Signed   By: Jeb Levering M.D.   On: 12/09/2016 01:15       ASSESSMENT and PLAN  Systemic vasculitis syndrome ([place]) Features c/w severe systemic Vasculitis with BVASC score of 23 based on renal, pumonary, skin inviolvement and sinus symptopms and headache at admit 12/07/2016   On 12/08/16 - started 1g  Solumedrol per day and week 1 of 4 rituxan  On 12/09/2016 - blood panel stil pending   Plan - await autoimmune and vasculitis panel  - continue high dose solumedrol - - continue Weekly Ritxuan 4 doses; 12/15/16, 12/22/16 and 12/29/16 - await G6PD and if normal start 1DS bactrim MWF - renal for biopsy week of 12/10/16 if needed - dc van - no evidence of infection  -continue zosyn for 12/09/2016 and depending on autoimmune panel dc zosyn - OPD fu with Dr Chase Caller   Acute respiratory failure with hypoxia ([place]) Due to alv hge at admit 12/07/2016 - On 12/09/2016 improved but still bipap dependent  Plan - continue bipap  -  intubate if worse  Intra-alveolar hemorrhage Classic CT but focal R > L with associated hgb drop and hypoxemia. 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 Patient updatd. Called husband but went to wrong number x 2      FAMILY  - Updates: 12/09/2016 --> no family at bedside.   - Inter-disciplinary family meet or Palliative Care meeting due by:  DAy 7. Current LOS is LOS 1 days  CODE STATUS    Code Status Orders        Start     Ordered   12/08/16 0109  Full code  Continuous     12/08/16 0109    Code Status History    Date Active Date Inactive Code Status Order ID Comments User Context   07/04/2012  5:25 PM 07/07/2012  7:14 PM Full Code [id number]  Amy Ardelia Mems, RN Inpatient   07/01/2012  3:20 PM 07/04/2012  5:25 PM Full Code [id number]  Winferd Humphrey, RN Inpatient        DISPO Keep in ICU       The patient is critically ill with multiple organ systems failure and requires high complexity decision making for assessment and support, frequent evaluation and titration of therapies, application of advanced monitoring technologies and extensive interpretation of multiple  databases.   Critical Care Time devoted to patient care services described in this note is  30  Minutes. This time reflects time of care of this signee Dr Brand Males. This critical care time does not reflect procedure time, or teaching time or supervisory time of PA/NP/Med student/Med Resident etc but could involve care discussion time    Dr. Brand Males, M.D., [facility].C.P Pulmonary and Critical Care Medicine Staff Physician [facility] Pager: [phone], If no answer or between  15:00h - 7:00h: call [phone]  12/09/2016 2:19 PM

## 2016-12-09 NOTE — Progress Notes (Signed)
eLink Physician-Brief Progress Note Patient Name: Catherine Aguilar DOB: 11/10/1955 MRN: [id number]   Date of Service  12/09/2016  HPI/Events of Note  Hgb remains low at 6.2  eICU Interventions  Transfuse 1 unit pRBC Post-transfusion CBC     Intervention Category Intermediate Interventions: Other:  [name] 12/09/2016, 3:41 AM

## 2016-12-09 NOTE — Progress Notes (Signed)
SLP Cancellation Note  Patient Details Name: Catherine Aguilar MRN: [id number] DOB: 1956/05/24   Cancelled treatment:       Reason Eval/Treat Not Completed: Medical issues which prohibited therapy. Attempted x2, pt still on BiPAP. SLP will f/u when appropriate.  Deneise Lever, [place] CF-SLP Speech-Language Pathologist [phone]   Aliene Altes 12/09/2016, 4:02 PM

## 2016-12-09 NOTE — Progress Notes (Signed)
Patient complaining of headache and beign hungry after not eating in three days. BiPAP mask removed at this time and patient placed on 5L nasal cannula. RT will continue to monitor.

## 2016-12-10 ENCOUNTER — Other Ambulatory Visit: Payer: Medicaid Other

## 2016-12-10 ENCOUNTER — Inpatient Hospital Stay (HOSPITAL_COMMUNITY): Payer: Medicaid Other

## 2016-12-10 DIAGNOSIS — I10 Essential (primary) hypertension: Secondary | ICD-10-CM

## 2016-12-10 DIAGNOSIS — R0902 Hypoxemia: Secondary | ICD-10-CM

## 2016-12-10 LAB — BASIC METABOLIC PANEL
Anion gap: 16 — ABNORMAL HIGH (ref 5–15)
BUN: 85 mg/dL — ABNORMAL HIGH (ref 6–20)
CHLORIDE: 107 mmol/L (ref 101–111)
CO2: 15 mmol/L — ABNORMAL LOW (ref 22–32)
Calcium: 8.9 mg/dL (ref 8.9–10.3)
Creatinine, Ser: 4.49 mg/dL — ABNORMAL HIGH (ref 0.44–1.00)
GFR calc non Af Amer: 10 mL/min — ABNORMAL LOW (ref >60)
GFR, EST AFRICAN AMERICAN: 11 mL/min — AB (ref >60)
Glucose, Bld: 188 mg/dL — ABNORMAL HIGH (ref 65–99)
POTASSIUM: 3.8 mmol/L (ref 3.5–5.1)
SODIUM: 138 mmol/L (ref 135–145)

## 2016-12-10 LAB — GLUCOSE, CAPILLARY
GLUCOSE-CAPILLARY: 174 mg/dL — AB (ref 65–99)
GLUCOSE-CAPILLARY: 175 mg/dL — AB (ref 65–99)
GLUCOSE-CAPILLARY: 179 mg/dL — AB (ref 65–99)
Glucose-Capillary: 213 mg/dL — ABNORMAL HIGH (ref 65–99)
Glucose-Capillary: 207 mg/dL — ABNORMAL HIGH (ref 65–99)
Glucose-Capillary: 262 mg/dL — ABNORMAL HIGH (ref 65–99)

## 2016-12-10 LAB — FANA STAINING PATTERNS

## 2016-12-10 LAB — LEGIONELLA PNEUMOPHILA SEROGP 1 UR AG: L. pneumophila Serogp 1 Ur Ag: NEGATIVE

## 2016-12-10 LAB — HEPATIC FUNCTION PANEL
ALBUMIN: 2.8 g/dL — AB (ref 3.5–5.0)
ALK PHOS: 64 U/L (ref 38–126)
ALT: 23 U/L (ref 14–54)
AST: 15 U/L (ref 15–41)
BILIRUBIN TOTAL: 0.7 mg/dL (ref 0.3–1.2)
Bilirubin, Direct: <0.1 mg/dL — ABNORMAL LOW (ref 0.1–0.5)
TOTAL PROTEIN: 7.3 g/dL (ref 6.5–8.1)

## 2016-12-10 LAB — CULTURE, RESPIRATORY W GRAM STAIN

## 2016-12-10 LAB — CULTURE, RESPIRATORY: CULTURE: NORMAL

## 2016-12-10 LAB — PHOSPHORUS: Phosphorus: 6.5 mg/dL — ABNORMAL HIGH (ref 2.5–4.6)

## 2016-12-10 LAB — SJOGRENS SYNDROME-A EXTRACTABLE NUCLEAR ANTIBODY: SSA (Ro) (ENA) Antibody, IgG: <0.2 AI (ref 0.0–0.9)

## 2016-12-10 LAB — MPO/PR-3 (ANCA) ANTIBODIES
ANCA Proteinase 3: <3.5 U/mL (ref 0.0–3.5)
Myeloperoxidase Abs: 48.6 U/mL — ABNORMAL HIGH (ref 0.0–9.0)

## 2016-12-10 LAB — ANTI-DNA ANTIBODY, DOUBLE-STRANDED: ds DNA Ab: 6 [IU]/mL (ref 0–9)

## 2016-12-10 LAB — ANTI-SCLERODERMA ANTIBODY: Scleroderma (Scl-70) (ENA) Antibody, IgG: <0.2 AI (ref 0.0–0.9)

## 2016-12-10 LAB — GLOMERULAR BASEMENT MEMBRANE ANTIBODIES: GBM AB: 5 U (ref 0–20)

## 2016-12-10 LAB — PROTIME-INR
INR: 1.35
Prothrombin Time: 16.8 seconds — ABNORMAL HIGH (ref 11.4–15.2)

## 2016-12-10 LAB — SJOGRENS SYNDROME-B EXTRACTABLE NUCLEAR ANTIBODY

## 2016-12-10 LAB — PROCALCITONIN: Procalcitonin: 2.8 ng/mL

## 2016-12-10 LAB — MAGNESIUM: MAGNESIUM: 2 mg/dL (ref 1.7–2.4)

## 2016-12-10 LAB — ALDOLASE: Aldolase: 10.2 U/L (ref 3.3–10.3)

## 2016-12-10 LAB — ANTINUCLEAR ANTIBODIES, IFA: ANA Ab, IFA: POSITIVE — AB

## 2016-12-10 MED ORDER — LIDOCAINE HCL (PF) 2 % IJ SOLN
40.0000 mL | Freq: Once | INTRAMUSCULAR | Status: DC
  Filled 2016-12-10: qty 40

## 2016-12-10 MED ORDER — LIDOCAINE HCL (PF) 2 % IJ SOLN
20.0000 mL | Freq: Once | INTRAMUSCULAR | Status: AC
  Administered 2016-12-10: 20 mL via INTRADERMAL
  Filled 2016-12-10: qty 20

## 2016-12-10 NOTE — Progress Notes (Signed)
Patient is continuing to rest comfortably on 5L nasal cannula, SpO2 of 97%. BiPAP at bedside on stand-by. RT will continue to monitor.

## 2016-12-10 NOTE — Progress Notes (Signed)
PULMONARY / CRITICAL CARE MEDICINE   Name: Catherine Aguilar MRN: [id number] DOB: Jan 19, 1956 PCP Maggie Font, MD LOS 2 as of 12/10/2016     ADMISSION DATE:  12/07/2016 CONSULTATION DATE:  12/09/16    brief chronic sinusitis nos x 2 years along withupper back skin lesions and headache nos without relief. Now with features of alveolra hemorrage on CT with acute renal failure and urine with RBC numeours. BVASC score of 23 at admit. She smokes tobacco and MJ but  denies cocaine. STarted Iv sterpids and Week 1 of 4 Rituxan 375mg /BSAm2. Vasculitis and autoimmune panel pending 12/09/16 G6PD pending   SUBJECTIVE/OVERNIGHT/INTERVAL HX No events overnight   VITAL SIGNS: BP (!) 181/89   Pulse 84   Temp 97.3 F (36.3 C) (Oral)   Resp (!) 21   Ht 5\' 9"  (1.753 m)   Wt 95.4 kg (210 lb 5.1 oz)   SpO2 92%   BMI 31.06 kg/m   HEMODYNAMICS:    VENTILATOR SETTINGS: FiO2 (%):  [30 %] 30 %  INTAKE / OUTPUT: I/O last 3 completed shifts: In: 1668.5 [P.O.:550; I.V.:135; Blood:365; IV Piggyback:618.5] Out: 4400 [Urine:4400]  EXAM  General: well appearing, NAD Heart: RRR, Nl S1/S2, -M/R/G. Lung: CTA bilaterally Abdomen: Soft, NT, ND and +BS Ext: -edema and -tenderness Neuro: Alert and interactive, moving all ext to command Skin: Intact  LABS  PULMONARY  Recent Labs Lab 12/08/16 0130  PHART 7.258*  PCO2ART 28.7*  PO2ART 93.0  HCO3 12.4*  O2SAT 95.1   CBC  Recent Labs Lab 12/09/16 0212 12/09/16 0839 12/09/16 1809  HGB 6.2* 7.8* 7.7*  HCT 18.8* 23.9* 23.5*  WBC 9.1 10.7* 11.6*  PLT 421* 476* 497*   COAGULATION  Recent Labs Lab 12/08/16 0001 12/10/16 0248  INR 1.21 1.35   CARDIAC  Recent Labs Lab 12/08/16 1520 12/08/16 1851 12/09/16 0212  TROPONINI <0.03 <0.03 <0.03   No results for input(s): PROBNP in the last 168 hours.  CHEMISTRY  Recent Labs Lab 12/07/16 2313 12/08/16 0911 12/09/16 0212 12/10/16 0248  NA 135 136  --  138  K 4.3 4.7  --  3.8   CL 110 111  --  107  CO2 14* 13*  --  15*  GLUCOSE 189* 187*  --  188*  BUN 77* 65*  --  85*  CREATININE 4.41* 4.26* 4.28* 4.49*  CALCIUM 8.9 8.4*  --  8.9  MG  --   --   --  2.0  PHOS  --   --   --  6.5*   Estimated Creatinine Clearance: 16.2 mL/min (A) (by C-G formula based on SCr of 4.49 mg/dL (H)).  LIVER  Recent Labs Lab 12/08/16 0001 12/10/16 0248  AST 36 15  ALT 30 23  ALKPHOS 79 64  BILITOT 0.5 0.7  PROT 7.6 7.3  ALBUMIN 2.9* 2.8*  INR 1.21 1.35   INFECTIOUS  Recent Labs Lab 12/08/16 0143 12/08/16 0836 12/09/16 0212 12/10/16 0248  LATICACIDVEN 1.1  --   --   --   PROCALCITON  --  3.33 3.90 2.80   ENDOCRINE CBG (last 3)   Recent Labs  12/10/16 0407 12/10/16 0824 12/10/16 1133  GLUCAP 175* 174* 213*   IMAGING x48h  - image(s) personally visualized  -   highlighted in bold Ct Head Wo Contrast  Result Date: 12/09/2016 CLINICAL DATA:  Headache.  Vasculitis. EXAM: CT HEAD WITHOUT CONTRAST CT MAXILLOFACIAL WITHOUT CONTRAST TECHNIQUE: Multidetector CT imaging of the head and maxillofacial structures were performed  using the standard protocol without intravenous contrast. Multiplanar CT image reconstructions of the maxillofacial structures were also generated. COMPARISON:  Head CT and brain MRI October 2013 FINDINGS: CT HEAD FINDINGS Brain: No evidence of acute infarction, hemorrhage, hydrocephalus, extra-axial collection or mass lesion/mass effect. Vascular: Atherosclerosis of skullbase vasculature without hyperdense vessel or abnormal calcification. Skull: Normal. Negative for fracture or focal lesion. Mastoid air cells are well-aerated. Other: None. CT MAXILLOFACIAL FINDINGS Osseous: No fracture or bony destructive change. There are multiple missing teeth. No periapical lucencies. Degenerative change of both temporomandibular joints, imaging obtained with open mouth. Orbits: Negative. No traumatic or inflammatory finding. Sinuses: Clear.  No mucosal thickening or  fluid levels. Soft tissues: Negative. IMPRESSION: 1.  No acute intracranial abnormality. 2. No acute facial bone abnormality. Mild chronic change at the temporomandibular joints. Multiple absent teeth, chronic. Electronically Signed   By: Jeb Levering M.D.   On: 12/09/2016 01:15   Dg Chest Port 1 View  Result Date: 12/09/2016 CLINICAL DATA:  Followup for diffuse pulmonary hemorrhage. EXAM: PORTABLE CHEST 1 VIEW COMPARISON:  Chest CT, 12/08/2016.  Chest radiograph, 12/07/2016. FINDINGS: There is extensive consolidation in the right upper lobe was less dense consolidation in the right lower lobe. A smaller area of consolidation is noted in the left upper lobe. Findings are stable from the previous day's CT scan. Right lower lobe opacity has mildly increased when compared the prior chest radiograph. No convincing pleural effusion.  No pneumothorax. Heart is mildly enlarged. IMPRESSION: 1. Extensive areas of lung consolidation, mostly on the right, stable from the previous day's chest CT scan. Electronically Signed   By: Lajean Manes M.D.   On: 12/09/2016 11:03   Ct Maxillofacial Wo Contrast  Result Date: 12/09/2016 CLINICAL DATA:  Headache.  Vasculitis. EXAM: CT HEAD WITHOUT CONTRAST CT MAXILLOFACIAL WITHOUT CONTRAST TECHNIQUE: Multidetector CT imaging of the head and maxillofacial structures were performed using the standard protocol without intravenous contrast. Multiplanar CT image reconstructions of the maxillofacial structures were also generated. COMPARISON:  Head CT and brain MRI October 2013 FINDINGS: CT HEAD FINDINGS Brain: No evidence of acute infarction, hemorrhage, hydrocephalus, extra-axial collection or mass lesion/mass effect. Vascular: Atherosclerosis of skullbase vasculature without hyperdense vessel or abnormal calcification. Skull: Normal. Negative for fracture or focal lesion. Mastoid air cells are well-aerated. Other: None. CT MAXILLOFACIAL FINDINGS Osseous: No fracture or bony  destructive change. There are multiple missing teeth. No periapical lucencies. Degenerative change of both temporomandibular joints, imaging obtained with open mouth. Orbits: Negative. No traumatic or inflammatory finding. Sinuses: Clear.  No mucosal thickening or fluid levels. Soft tissues: Negative. IMPRESSION: 1.  No acute intracranial abnormality. 2. No acute facial bone abnormality. Mild chronic change at the temporomandibular joints. Multiple absent teeth, chronic. Electronically Signed   By: Jeb Levering M.D.   On: 12/09/2016 01:15   I reviewed CXR myself, infiltrate noted.  ASSESSMENT and PLAN  Systemic vasculitis syndrome ([place]) Features c/w severe systemic Vasculitis with BVASC score of 23 based on renal, pumonary, skin inviolvement and sinus symptopms and headache at admit 12/07/2016   On 12/08/16 - started 1g  Solumedrol per day and week 1 of 4 rituxan  On 12/09/2016 - blood panel stil pending   Plan - Await autoimmune and vasculitis panel - Continue high dose solumedrol - Continue Weekly Ritxuan 4 doses; 12/15/16, 12/22/16 and 12/29/16 - Await G6PD and if normal start 1DS bactrim MWF - Renal biopsy today per renal - Skin biopsy today. - D/C Lucianne Lei - no evidence of  infection - Continue zosyn for 12/09/2016 continue for total of 8 days for now - OPD fu with Dr Chase Caller  Acute respiratory failure with hypoxia ([place]) Due to alv hge at admit 12/07/2016 - On 12/09/2016 improved but still bipap dependent  Plan - [place] BiPAP to PRN  Intra-alveolar hemorrhage Classic CT but focal R > L with associated hgb drop and hypoxemia. Needed prb 12/07/16 and 12/09/16  Plan - Rx vasculitis as above - Monitor clinically  Acute renal failure (ARF) ([facility]) - Dc ace inhibitor 12/08/16 - Renal biopsy per renal recommendations. 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 - Control bp with hydralaizine 50 mg TID - Hold ACE  FAMILY  - Updates: Patient updated bedside  - Inter-disciplinary family meet or Palliative Care meeting due by:  DAy 7. Current LOS is LOS 2 days  Discussed with bedside RN and TRH-MD.  Transfer to tele and to [facility] service with PCCM following as consult starting 4/3.  Rush Farmer, M.D. [facility] Pulmonary/Critical Care Medicine. Pager: [phone]. After hours pager: [phone].  12/10/2016 11:52 AM

## 2016-12-10 NOTE — Progress Notes (Signed)
Procedure note:  Dx: vasculitis with rash. Procedure:  4 mm skin punch biopsy right back   Pt was seen.  A permit for skin punch biopsy, right back, etiology unknown was obtained, I am named as person performing the biopsy.  A site that had not been scratched was picked. Site was cleaned with Chlorhexidine skin prep.    5 ml of 2% plain lidocaine was injected and anesthesia was obtained.  We double check to be sure she had not pain with 23 g needle.  Then a 4 mm skin punch was used to obtain biopsy specimen.  It was place in [place].  Direct pressure with 2 x 2 was used to control bleeding.  After the bleeding was controled a sterile dressing was applied.  I am taking specimen to the lab for processing.     Will [facility]to [facility] [phone]  12/10/2016 12:38 PM

## 2016-12-10 NOTE — Care Management (Signed)
Pt has active medicaid - medication assistance not applicable for current medication regimen

## 2016-12-10 NOTE — Evaluation (Signed)
Clinical/Bedside Swallow Evaluation Patient Details  Name: Catherine Aguilar MRN: [id number] Date of Birth: 01-25-1956  Today's Date: 12/10/2016 Time: SLP Start Time (ACUTE ONLY): 1113 SLP Stop Time (ACUTE ONLY): 1125 SLP Time Calculation (min) (ACUTE ONLY): 12 min  Past Medical History:  Past Medical History:  Diagnosis Date  . High cholesterol   . Hypertension    stopped taking medications 10 years ago  . Stroke [facility])    Past Surgical History:  Past Surgical History:  Procedure Laterality Date  . ABDOMINAL HYSTERECTOMY  1985   HPI:  61 y.o. female admitted with systemic vasculitis syndrome, acute resp failure with hypoxia, intra alveolar hemorrhage, protein cal malnutrition, acute renal failure. Has required bipap, able to come off intermittently.   Assessment / Plan / Recommendation Clinical Impression  Pt presents with normal oropharyngeal swallow function with adequate mastication, brisk swallow response, no overt s/s of aspiration - passed three oz water test.  Advance diet to regular, thin liquids.  No SLP f/u warranted.  SLP Visit Diagnosis: Dysphagia, unspecified (R13.10)    Aspiration Risk  No limitations    Diet Recommendation   regular, thin liquids  Medication Administration: Whole meds with liquid    Other  Recommendations Oral Care Recommendations: Oral care BID   Follow up Recommendations None      Frequency and Duration            Prognosis        Swallow Study   General Date of Onset: 12/07/16 HPI: 61 y.o. female admitted with systemic vasculitis syndrome, acute resp failure with hypoxia, intra alveolar hemorrhage, protein cal malnutrition, acute renal failure. Has required bipap, able to come off intermittently. Type of Study: Bedside Swallow Evaluation Diet Prior to this Study: NPO Temperature Spikes Noted: No Respiratory Status: Nasal cannula History of Recent Intubation: No Behavior/Cognition: Alert;Cooperative;Pleasant mood Oral Cavity  Assessment: Within Functional Limits Oral Care Completed by SLP: No Oral Cavity - Dentition: Adequate natural dentition Vision: Functional for self-feeding Self-Feeding Abilities: Able to feed self Patient Positioning: Upright in bed Baseline Vocal Quality: Normal Volitional Cough: Strong Volitional Swallow: Able to elicit    Oral/Motor/Sensory Function Overall Oral Motor/Sensory Function: Within functional limits   Ice Chips Ice chips: Within functional limits   Thin Liquid Thin Liquid: Within functional limits    Nectar Thick Nectar Thick Liquid: Not tested   Honey Thick Honey Thick Liquid: Not tested   Puree Puree: Within functional limits   Solid   GO   Solid: Within functional limits        Juan Quam Laurice 12/10/2016,11:31 AM  Estill Bamberg L. Tivis Ringer, [place] CCC/SLP Pager [phone]

## 2016-12-10 NOTE — Progress Notes (Signed)
PULMONARY / CRITICAL CARE MEDICINE   Name: Catherine Aguilar MRN: [id number] DOB: [birth year] PCP Maggie Font, MD LOS 2 as of 12/10/2016     ADMISSION DATE:  12/07/2016 CONSULTATION DATE:  12/09/16    Summary chronic sinusitis nos x 2 years along withupper back skin lesions and headache nos without relief. Now with features of alveolra hemorrage on CT with acute renal failure and urine with RBC numeours. BVASC score of 23 at admit. She smokes tobacco and MJ but  denies cocaine. Started Iv steroids and Week 1 of 4 Rituxan 375mg /BSAm2. Vasculitis and autoimmune panel pending 12/09/16 G6PD pending   SUBJECTIVE/OVERNIGHT/INTERVAL HX No complaints, had a BM this morning. Still having hemoptysis    VITAL SIGNS: BP (!) 168/88   Pulse 76   Temp 97.5 F (36.4 C) (Oral)   Resp 13   Ht 5\' 9"  (1.753 m)   Wt 210 lb 5.1 oz (95.4 kg)   SpO2 96%   BMI 31.06 kg/m   HEMODYNAMICS:    VENTILATOR SETTINGS: FiO2 (%):  [30 %] 30 %  INTAKE / OUTPUT: I/O last 3 completed shifts: In: 1658.5 [P.O.:550; I.V.:135; Blood:365; IV Piggyback:608.5] Out: 4400 [Urine:4400]     EXAM General: NAD, obese HEENT: moist mucous membranes, PERRLA Cardiac: RRR, no rubs/gallops Lungs: clear to anterior auscultation, no wheezing.  Abd: active bowel sounds, non TTP, soft Skin: warm and dry                                                           LABS  PULMONARY  Recent Labs Lab 12/08/16 0130  PHART 7.258*  PCO2ART 28.7*  PO2ART 93.0  HCO3 12.4*  O2SAT 95.1    CBC  Recent Labs Lab 12/09/16 0212 12/09/16 0839 12/09/16 1809  HGB 6.2* 7.8* 7.7*  HCT 18.8* 23.9* 23.5*  WBC 9.1 10.7* 11.6*  PLT 421* 476* 497*    COAGULATION  Recent Labs Lab 12/08/16 0001 12/10/16 0248  INR 1.21 1.35     CHEMISTRY  Recent Labs Lab 12/07/16 2313 12/08/16 0911 12/09/16 0212 12/10/16 0248  NA 135 136  --  138  K 4.3 4.7  --  3.8  CL 110 111  --  107  CO2 14* 13*  --  15*   GLUCOSE 189* 187*  --  188*  BUN 77* 65*  --  85*  CREATININE 4.41* 4.26* 4.28* 4.49*  CALCIUM 8.9 8.4*  --  8.9  MG  --   --   --  2.0  PHOS  --   --   --  6.5*   Estimated Creatinine Clearance: 16.2 mL/min (A) (by C-G formula based on SCr of 4.49 mg/dL (H)).   LIVER  Recent Labs Lab 12/08/16 0001 12/10/16 0248  AST 36 15  ALT 30 23  ALKPHOS 79 64  BILITOT 0.5 0.7  PROT 7.6 7.3  ALBUMIN 2.9* 2.8*  INR 1.21 1.35     INFECTIOUS  Recent Labs Lab 12/08/16 0143 12/08/16 0836 12/09/16 0212 12/10/16 0248  LATICACIDVEN 1.1  --   --   --   PROCALCITON  --  3.33 3.90 2.80     ENDOCRINE CBG (last 3)   Recent Labs  12/09/16 1945 12/10/16 0018 12/10/16 0407  GLUCAP 144* 179* 175*  IMAGING x48h  - image(s) personally visualized  -   highlighted in bold Ct Head Wo Contrast  Result Date: 12/09/2016 CLINICAL DATA:  Headache.  Vasculitis. EXAM: CT HEAD WITHOUT CONTRAST CT MAXILLOFACIAL WITHOUT CONTRAST TECHNIQUE: Multidetector CT imaging of the head and maxillofacial structures were performed using the standard protocol without intravenous contrast. Multiplanar CT image reconstructions of the maxillofacial structures were also generated. COMPARISON:  Head CT and brain MRI October 2013 FINDINGS: CT HEAD FINDINGS Brain: No evidence of acute infarction, hemorrhage, hydrocephalus, extra-axial collection or mass lesion/mass effect. Vascular: Atherosclerosis of skullbase vasculature without hyperdense vessel or abnormal calcification. Skull: Normal. Negative for fracture or focal lesion. Mastoid air cells are well-aerated. Other: None. CT MAXILLOFACIAL FINDINGS Osseous: No fracture or bony destructive change. There are multiple missing teeth. No periapical lucencies. Degenerative change of both temporomandibular joints, imaging obtained with open mouth. Orbits: Negative. No traumatic or inflammatory finding. Sinuses: Clear.  No mucosal thickening or fluid levels. Soft  tissues: Negative. IMPRESSION: 1.  No acute intracranial abnormality. 2. No acute facial bone abnormality. Mild chronic change at the temporomandibular joints. Multiple absent teeth, chronic. Electronically Signed   By: Jeb Levering M.D.   On: 12/09/2016 01:15   Dg Chest Port 1 View  Result Date: 12/09/2016 CLINICAL DATA:  Followup for diffuse pulmonary hemorrhage. EXAM: PORTABLE CHEST 1 VIEW COMPARISON:  Chest CT, 12/08/2016.  Chest radiograph, 12/07/2016. FINDINGS: There is extensive consolidation in the right upper lobe was less dense consolidation in the right lower lobe. A smaller area of consolidation is noted in the left upper lobe. Findings are stable from the previous day's CT scan. Right lower lobe opacity has mildly increased when compared the prior chest radiograph. No convincing pleural effusion.  No pneumothorax. Heart is mildly enlarged. IMPRESSION: 1. Extensive areas of lung consolidation, mostly on the right, stable from the previous day's chest CT scan. Electronically Signed   By: Lajean Manes M.D.   On: 12/09/2016 11:03   Ct Maxillofacial Wo Contrast  Result Date: 12/09/2016 CLINICAL DATA:  Headache.  Vasculitis. EXAM: CT HEAD WITHOUT CONTRAST CT MAXILLOFACIAL WITHOUT CONTRAST TECHNIQUE: Multidetector CT imaging of the head and maxillofacial structures were performed using the standard protocol without intravenous contrast. Multiplanar CT image reconstructions of the maxillofacial structures were also generated. COMPARISON:  Head CT and brain MRI October 2013 FINDINGS: CT HEAD FINDINGS Brain: No evidence of acute infarction, hemorrhage, hydrocephalus, extra-axial collection or mass lesion/mass effect. Vascular: Atherosclerosis of skullbase vasculature without hyperdense vessel or abnormal calcification. Skull: Normal. Negative for fracture or focal lesion. Mastoid air cells are well-aerated. Other: None. CT MAXILLOFACIAL FINDINGS Osseous: No fracture or bony destructive change. There  are multiple missing teeth. No periapical lucencies. Degenerative change of both temporomandibular joints, imaging obtained with open mouth. Orbits: Negative. No traumatic or inflammatory finding. Sinuses: Clear.  No mucosal thickening or fluid levels. Soft tissues: Negative. IMPRESSION: 1.  No acute intracranial abnormality. 2. No acute facial bone abnormality. Mild chronic change at the temporomandibular joints. Multiple absent teeth, chronic. Electronically Signed   By: Jeb Levering M.D.   On: 12/09/2016 01:15       ASSESSMENT and PLAN  Systemic vasculitis syndrome ([place]) Features c/w severe systemic Vasculitis with BVASC score of 23 based on renal, pumonary, skin inviolvement and sinus symptopms and headache at admit 12/07/2016   On 12/08/16 - started 1g  Solumedrol per day and week 1 of 4 rituxan  On 12/09/2016 - blood panel stil pending   Plan - await  autoimmune and vasculitis panel  - continue high dose solumedrol  - continue Weekly Ritxuan 4 doses; 12/15/16, 12/22/16 and 12/29/16 - await G6PD and if normal start 1DS bactrim MWF - nephro consulted for renal biopsy - dc van - no evidence of infection  -continue zosyn for 12/09/2016 and depending on autoimmune panel dc zosyn - OPD fu with Dr Chase Caller   Acute respiratory failure with hypoxia ([place]) --2/2 to alv hge at admit 12/07/2016. Respiratory viral panel negative.  - improved, on [place]  Intra-alveolar hemorrhage--Classic CT but focal R > L with associated hgb drop and hypoxemia. Needed prb 12/07/16 and 12/09/16 - PRBC for hgb </= 6.9gm%    - exceptions are:   -  if ACS susepcted/confirmed then transfuse for hgb </= 8.0gm%,  or    -  active bleeding with hemodynamic instability, then transfuse regardless of hemoglobin value   At at all times try to transfuse 1 unit prbc as possible with exception of active hemorrhage - Rx vasculitis  Acute renal failure (ARF) ([place])-- creatinine 4.49 12/10/16, was 0.80 on 06/05/16. Renal u/s 3/31 neg for  hydronephrosis.  - Dc ace inhibitor 12/08/16 - CTM   Cutaneous vasculitis --Suspected in upper back - general sx saw patient on 3/31 for consideration of punch biopsy, biopsy scheduled for today.    Substance abuse-Tobacco and MJ + -SW consulted  Essential hypertension - await echo - Lasix 40mg  BID and hydralazine 50mg  TID   Goals of care, counseling/discussion Full code.       FAMILY  - Updates: 12/10/2016 --> no family at bedside.   - Inter-disciplinary family meet or Palliative Care meeting due by:  DAy 7. Current LOS is LOS 2 days  CODE STATUS    Code Status Orders        Start     Ordered   12/08/16 0109  Full code  Continuous     12/08/16 0109    Code Status History    Date Active Date Inactive Code Status Order ID Comments User Context   07/04/2012  5:25 PM 07/07/2012  7:14 PM Full Code [id number]  Amy Ardelia Mems, RN Inpatient   07/01/2012  3:20 PM 07/04/2012  5:25 PM Full Code [id number]  Marites Ashok Pall, RN Inpatient        DISPO - CTM in the ICU     Julious Oka, MD Internal Medicine Resident, PGY [place] [facility] Pager: [phone]    12/10/2016 7:24 AM

## 2016-12-10 NOTE — Progress Notes (Signed)
Patient is currently on 4LNC with sats of 94%. Patient is resting comfortably in no distress and all vitals are stable. BIPAP is in room but not needed at this time. Will continue to monitor.

## 2016-12-11 ENCOUNTER — Telehealth: Payer: Self-pay | Admitting: Internal Medicine

## 2016-12-11 DIAGNOSIS — J189 Pneumonia, unspecified organism: Secondary | ICD-10-CM

## 2016-12-11 LAB — GLUCOSE, CAPILLARY
GLUCOSE-CAPILLARY: 281 mg/dL — AB (ref 65–99)
GLUCOSE-CAPILLARY: 366 mg/dL — AB (ref 65–99)
Glucose-Capillary: 160 mg/dL — ABNORMAL HIGH (ref 65–99)
Glucose-Capillary: 176 mg/dL — ABNORMAL HIGH (ref 65–99)
Glucose-Capillary: 196 mg/dL — ABNORMAL HIGH (ref 65–99)
Glucose-Capillary: 297 mg/dL — ABNORMAL HIGH (ref 65–99)

## 2016-12-11 LAB — CREATININE, SERUM
CREATININE: 4.52 mg/dL — AB (ref 0.44–1.00)
GFR calc Af Amer: 11 mL/min — ABNORMAL LOW (ref >60)
GFR calc non Af Amer: 10 mL/min — ABNORMAL LOW (ref >60)

## 2016-12-11 LAB — ANCA TITERS
C-ANCA: <1:20 {titer}
P-ANCA: 1:320 {titer} — ABNORMAL HIGH

## 2016-12-11 LAB — MAGNESIUM: Magnesium: 2 mg/dL (ref 1.7–2.4)

## 2016-12-11 LAB — PHOSPHORUS: PHOSPHORUS: 6.2 mg/dL — AB (ref 2.5–4.6)

## 2016-12-11 MED ORDER — WHITE PETROLATUM GEL
Status: AC
  Administered 2016-12-11: 1
  Filled 2016-12-11: qty 1

## 2016-12-11 MED ORDER — HYDRALAZINE HCL 50 MG PO TABS
75.0000 mg | ORAL_TABLET | Freq: Three times a day (TID) | ORAL | Status: DC
  Administered 2016-12-11 – 2016-12-16 (×15): 75 mg via ORAL
  Filled 2016-12-11 (×17): qty 1

## 2016-12-11 NOTE — Progress Notes (Signed)
PROGRESS NOTE    Catherine Aguilar  JQD:[id number] DOB: 1956/04/25 DOA: 12/07/2016 PCP: Maggie Font, MD   Brief Narrative:  61 year old female with history of substance abuse came to the ED with complaints of hemoptysis and shortness of breath over the course of several weeks. She was found to have renal impairment with infiltrates on the imaging. At this time there is concerns of autoimmune vasculitis therefore workup in progress.   Assessment & Plan:   Principal Problem:   Systemic vasculitis syndrome ([place]) Active Problems:   CVA (cerebral vascular accident) ([place])   Essential hypertension   HLD (hyperlipidemia)   Tobacco abuse   AKI (acute kidney injury) ([place])   Sepsis ([place])   Hemoptysis   CAP (community acquired pneumonia)   Symptomatic anemia   Acute respiratory failure with hypoxia (HCC)   Wegener's granulomatosis with renal involvement ([place])   Intra-alveolar hemorrhage   Acute renal failure (ARF) (HCC)   Cutaneous vasculitis   Substance abuse   Goals of care, counseling/discussion  Possible systemic vasculitis syndrome -Involvement of renal, N, pulmonary and sinus symptoms -Sedimentation rate and CRP is elevated. Rheumatoid factor is elevated along with Myeloperoxidase Abx  -Continue IV steroids and rituximab. -Follow-up skin and renal biopsy results-still pending at this time -Avoid nephrotoxic drugs -Once G6PD results are back and if they're normal patient can be started on Bactrim Monday Wednesday Friday -Continue Zosyn for now  Acute hypoxic respiratory failure likely secondary to alveolar hemorrhage -It is improved at this time -Supplemental oxygenation. Wean off as needed -Respiratory panel is negative at this time. -Status post PRBC transfusion and now hemoglobin is stable next-pulmonary disease following.  History of substance abuse  -counseled on quitting tobacco and marijuana -Nicotine patch ordered  Essential hypertension -Hydralazine has been  increased to 75 mg 3 times daily. Continue IV hydralazine 10 mg every 2-3 hours as needed  Hx of CVA - cont statin, ASA on hold due to hemoptysis and alveolar hemorrhage.   Alcohol Abuse  -CIWA protocol in place    DVT prophylaxis: SCD Code Status: Full code  Family Communication:  None at bedside  Disposition Plan: Stepdown   Consultants:   PCCM  Antimicrobials:   Vanc - stopped   Zosyn    Subjective: No complaints at this time. Patient states she feels ok at this time.   Objective: Vitals:   12/11/16 1159 12/11/16 1200 12/11/16 1300 12/11/16 1400  BP:  (!) 178/103 (!) 162/93 (!) 175/91  Pulse:  69 77 75  Resp:  12 (!) 24 19  Temp: 98.1 F (36.7 C)     TempSrc:      SpO2:  94% 93% 96%  Weight:      Height:        Intake/Output Summary (Last 24 hours) at 12/11/16 1439 Last data filed at 12/11/16 1244  Gross per 24 hour  Intake              394 ml  Output                2 ml  Net              392 ml   Filed Weights   12/08/16 0145 12/08/16 0420  Weight: 101.2 kg (223 lb) 95.4 kg (210 lb 5.1 oz)    Examination:  General exam: Appears calm and comfortable  Respiratory system: Clear to auscultation. Respiratory effort normal. Cardiovascular system: S1 & S2 heard, RRR. No JVD, murmurs, rubs, gallops or  clicks. No pedal edema. Gastrointestinal system: Abdomen is nondistended, soft and nontender. No organomegaly or masses felt. Normal bowel sounds heard. Central nervous system: Alert and oriented. No focal neurological deficits. Extremities: Symmetric 5 x 5 power. Skin: No rashes, lesions or ulcers Psychiatry: Judgement and insight appear normal. Mood & affect appropriate.     Data Reviewed:   CBC:  Recent Labs Lab 12/07/16 2313  12/08/16 0909 12/08/16 1853 12/09/16 0212 12/09/16 0839 12/09/16 1809  WBC 10.8*  < > 10.6* 9.5 9.1 10.7* 11.6*  NEUTROABS 8.0*  --  8.6*  --   --   --   --   HGB 6.1*  < > 5.3* 6.3* 6.2* 7.8* 7.7*  HCT 19.0*  < >  16.9* 19.5* 18.8* 23.9* 23.5*  MCV 75.4*  < > 74.8* 74.7* 74.0* 76.1* 75.1*  PLT 512*  < > 456* 413* 421* 476* 497*  < > = values in this interval not displayed. Basic Metabolic Panel:  Recent Labs Lab 12/07/16 2313 12/08/16 0911 12/09/16 0212 12/10/16 0248 12/11/16 0148  NA 135 136  --  138  --   K 4.3 4.7  --  3.8  --   CL 110 111  --  107  --   CO2 14* 13*  --  15*  --   GLUCOSE 189* 187*  --  188*  --   BUN 77* 65*  --  85*  --   CREATININE 4.41* 4.26* 4.28* 4.49* 4.52*  CALCIUM 8.9 8.4*  --  8.9  --   MG  --   --   --  2.0 2.0  PHOS  --   --   --  6.5* 6.2*   GFR: Estimated Creatinine Clearance: 16.1 mL/min (A) (by C-G formula based on SCr of 4.52 mg/dL (H)). Liver Function Tests:  Recent Labs Lab 12/08/16 0001 12/10/16 0248  AST 36 15  ALT 30 23  ALKPHOS 79 64  BILITOT 0.5 0.7  PROT 7.6 7.3  ALBUMIN 2.9* 2.8*   No results for input(s): LIPASE, AMYLASE in the last 168 hours. No results for input(s): AMMONIA in the last 168 hours. Coagulation Profile:  Recent Labs Lab 12/08/16 0001 12/10/16 0248  INR 1.21 1.35   Cardiac Enzymes:  Recent Labs Lab 12/08/16 0836 12/08/16 1520 12/08/16 1851 12/09/16 0212  CKTOTAL 749*  --   --   --   CKMB 12.3*  --   --   --   TROPONINI  --  <0.03 <0.03 <0.03   BNP (last 3 results) No results for input(s): PROBNP in the last 8760 hours. HbA1C: No results for input(s): HGBA1C in the last 72 hours. CBG:  Recent Labs Lab 12/10/16 1940 12/11/16 0027 12/11/16 0307 12/11/16 0736 12/11/16 1142  GLUCAP 262* 196* 160* 176* 281*   Lipid Profile: No results for input(s): CHOL, HDL, LDLCALC, TRIG, CHOLHDL, LDLDIRECT in the last 72 hours. Thyroid Function Tests: No results for input(s): TSH, T4TOTAL, FREET4, T3FREE, THYROIDAB in the last 72 hours. Anemia Panel: No results for input(s): VITAMINB12, FOLATE, FERRITIN, TIBC, IRON, RETICCTPCT in the last 72 hours. Sepsis Labs:  Recent Labs Lab 12/08/16 0122  12/08/16 0143 12/08/16 0836 12/09/16 0212 12/10/16 0248  PROCALCITON 2.66  --  3.33 3.90 2.80  LATICACIDVEN  --  1.1  --   --   --     Recent Results (from the past 240 hour(s))  Culture, blood (routine x 2) Call MD if unable to obtain prior to antibiotics being given  Status: None (Preliminary result)   Collection Time: 12/07/16 11:10 PM  Result Value Ref Range Status   Specimen Description BLOOD BLOOD RIGHT FOREARM  Final   Special Requests BOTTLES DRAWN AEROBIC AND ANAEROBIC 5ML  Final   Culture   Final    NO GROWTH 3 DAYS Performed at [facility], [place] [address] [place], [place] [address]    Report Status PENDING  Incomplete  Culture, blood (routine x 2) Call MD if unable to obtain prior to antibiotics being given     Status: None (Preliminary result)   Collection Time: 12/07/16 11:12 PM  Result Value Ref Range Status   Specimen Description BLOOD LEFT HAND  Final   Special Requests BOTTLES DRAWN AEROBIC ONLY 6ML  Final   Culture   Final    NO GROWTH 3 DAYS Performed at [facility], [address] [place], [place] [address]    Report Status PENDING  Incomplete  Culture, sputum-assessment     Status: None   Collection Time: 12/08/16  1:08 AM  Result Value Ref Range Status   Specimen Description EXPECTORATED SPUTUM  Final   Special Requests NONE  Final   Sputum evaluation   Final    THIS SPECIMEN IS ACCEPTABLE FOR SPUTUM CULTURE Performed at [facility], [address], [place], [place] [address]    Report Status 12/08/2016 FINAL  Final  Culture, respiratory (NON-Expectorated)     Status: None   Collection Time: 12/08/16  1:08 AM  Result Value Ref Range Status   Specimen Description EXPECTORATED SPUTUM  Final   Special Requests NONE Reflexed from [id number]  Final   Gram Stain   Final    FEW SQUAMOUS EPITHELIAL CELLS PRESENT FEW WBC PRESENT, PREDOMINANTLY MONONUCLEAR FEW GRAM POSITIVE RODS FEW GRAM POSITIVE COCCI IN PAIRS FEW GRAM NEGATIVE  RODS    Culture   Final    Consistent with normal respiratory flora. Performed at [facility], [place] [address], [place], [place] [address]    Report Status 12/10/2016 FINAL  Final  MRSA PCR Screening     Status: None   Collection Time: 12/08/16  4:22 AM  Result Value Ref Range Status   MRSA by PCR NEGATIVE NEGATIVE Final    Comment:        The GeneXpert MRSA Assay (FDA approved for NASAL specimens only), is one component of a comprehensive MRSA colonization surveillance program. It is not intended to diagnose MRSA infection nor to guide or monitor treatment for MRSA infections.   Respiratory Panel by PCR     Status: None   Collection Time: 12/08/16  8:30 AM  Result Value Ref Range Status   Adenovirus NOT DETECTED NOT DETECTED Final   Coronavirus 229E NOT DETECTED NOT DETECTED Final   Coronavirus HKU1 NOT DETECTED NOT DETECTED Final   Coronavirus NL63 NOT DETECTED NOT DETECTED Final   Coronavirus OC43 NOT DETECTED NOT DETECTED Final   Metapneumovirus NOT DETECTED NOT DETECTED Final   Rhinovirus / Enterovirus NOT DETECTED NOT DETECTED Final   Influenza A NOT DETECTED NOT DETECTED Final   Influenza B NOT DETECTED NOT DETECTED Final   Parainfluenza Virus 1 NOT DETECTED NOT DETECTED Final   Parainfluenza Virus 2 NOT DETECTED NOT DETECTED Final   Parainfluenza Virus 3 NOT DETECTED NOT DETECTED Final   Parainfluenza Virus 4 NOT DETECTED NOT DETECTED Final   Respiratory Syncytial Virus NOT DETECTED NOT DETECTED Final   Bordetella pertussis NOT DETECTED NOT DETECTED  Final   Chlamydophila pneumoniae NOT DETECTED NOT DETECTED Final   Mycoplasma pneumoniae NOT DETECTED NOT DETECTED Final         Radiology Studies: No results found.      Scheduled Meds: . dextromethorphan-guaiFENesin  1 tablet Oral BID  . folic acid  1 mg Oral Daily  . furosemide  40 mg Intravenous Q12H  . hydrALAZINE  75 mg Oral TID  . insulin aspart  0-20 Units Subcutaneous Q4H  . ipratropium   0.5 mg Nebulization Q6H  . levalbuterol  1.25 mg Nebulization Q6H  . lidocaine-EPINEPHrine  20 mL Other Once  . methylPREDNISolone (SOLU-MEDROL) injection  250 mg Intravenous Q6H  . multivitamin with minerals  1 tablet Oral Daily  . nicotine  21 mg Transdermal Daily  . pantoprazole  40 mg Oral Daily  . piperacillin-tazobactam (ZOSYN)  IV  2.25 g Intravenous Q8H  . polyethylene glycol  17 g Oral Daily  . thiamine  100 mg Oral Daily   Continuous Infusions: . sodium chloride 10 mL/hr at 12/11/16 0700     LOS: 3 days    Time spent: 35 mins    Yarnell Kozloski Arsenio Loader, MD [facility] Pager [phone]   If 7PM-7AM, please contact night-coverage [email or web address] Password [facility] 12/11/2016, 2:39 PM

## 2016-12-11 NOTE — Telephone Encounter (Signed)
lmtcb for pt.  

## 2016-12-11 NOTE — Progress Notes (Signed)
Biopsy site looks fine without acute infection.  Told nurse to redress with triple antibiotic ointment and 4x4 gauze.  Results are pending.  We will sign off.  Kathryne Eriksson. Dahlia Bailiff, MD, [place] [phone] [facility]

## 2016-12-11 NOTE — Progress Notes (Signed)
PULMONARY / CRITICAL CARE MEDICINE   Name: Catherine Aguilar MRN: [id number] DOB: [birth year] PCP Maggie Font, MD LOS 3 as of 12/11/2016     ADMISSION DATE:  12/07/2016 CONSULTATION DATE:  12/09/16    HPI: chronic sinusitis nos x 2 years along with upper back skin lesions and headache nos without relief. Now with features of alveolra hemorrage on CT with acute renal failure and urine with RBC numberous. BVASC score of 23 at admit. She smokes tobacco and MJ but  denies cocaine. Started IV steroids and Week 1 of 4 Rituxan 375mg /BSAm2. Vasculitis and autoimmune panel pending 12/09/16 G6PD pending   SUBJECTIVE/OVERNIGHT/INTERVAL HX No Acute Issues overnight. Tolerated Diaz last night.   VITAL SIGNS: BP (!) 147/99   Pulse 76   Temp 98.1 F (36.7 C) (Oral)   Resp 17   Ht 5\' 9"  (1.753 m)   Wt 210 lb 5.1 oz (95.4 kg)   SpO2 96%   BMI 31.06 kg/m   HEMODYNAMICS:    VENTILATOR SETTINGS:    INTAKE / OUTPUT: I/O last 3 completed shifts: In: 64 [P.O.:240; I.V.:230; IV Piggyback:479] Out: 7782 [Urine:1301; Stool:2]  EXAM  General: Alert , oriented and appropriate, female supine in bed wearing nasal cannula Heart: RRR, no rubs murmurs or gallop Lung: Clear throughout, diminished per bases Abdomen: BS +, soft, flat, non-distended, non-tender to palpation Ext: -edema and -tenderness, no deformities noted Neuro: Alert, oriented x 3,  and  appropriate Skin: Warm, dry and intact  LABS  PULMONARY  Recent Labs Lab 12/08/16 0130  PHART 7.258*  PCO2ART 28.7*  PO2ART 93.0  HCO3 12.4*  O2SAT 95.1   CBC  Recent Labs Lab 12/09/16 0212 12/09/16 0839 12/09/16 1809  HGB 6.2* 7.8* 7.7*  HCT 18.8* 23.9* 23.5*  WBC 9.1 10.7* 11.6*  PLT 421* 476* 497*   COAGULATION  Recent Labs Lab 12/08/16 0001 12/10/16 0248  INR 1.21 1.35   CARDIAC  Recent Labs Lab 12/08/16 1520 12/08/16 1851 12/09/16 0212  TROPONINI <0.03 <0.03 <0.03   No results for input(s): PROBNP in the last  168 hours.  CHEMISTRY  Recent Labs Lab 12/07/16 2313 12/08/16 0911 12/09/16 0212 12/10/16 0248 12/11/16 0148  NA 135 136  --  138  --   K 4.3 4.7  --  3.8  --   CL 110 111  --  107  --   CO2 14* 13*  --  15*  --   GLUCOSE 189* 187*  --  188*  --   BUN 77* 65*  --  85*  --   CREATININE 4.41* 4.26* 4.28* 4.49* 4.52*  CALCIUM 8.9 8.4*  --  8.9  --   MG  --   --   --  2.0 2.0  PHOS  --   --   --  6.5* 6.2*   Estimated Creatinine Clearance: 16.1 mL/min (A) (by C-G formula based on SCr of 4.52 mg/dL (H)).  LIVER  Recent Labs Lab 12/08/16 0001 12/10/16 0248  AST 36 15  ALT 30 23  ALKPHOS 79 64  BILITOT 0.5 0.7  PROT 7.6 7.3  ALBUMIN 2.9* 2.8*  INR 1.21 1.35   INFECTIOUS  Recent Labs Lab 12/08/16 0143 12/08/16 0836 12/09/16 0212 12/10/16 0248  LATICACIDVEN 1.1  --   --   --   PROCALCITON  --  3.33 3.90 2.80   ENDOCRINE CBG (last 3)   Recent Labs  12/11/16 0027 12/11/16 0307 12/11/16 0736  GLUCAP 196* 160* 176*  IMAGING x48h  - image(s) personally visualized  -   highlighted in bold Dg Chest Port 1 View  Result Date: 12/09/2016 CLINICAL DATA:  Followup for diffuse pulmonary hemorrhage. EXAM: PORTABLE CHEST 1 VIEW COMPARISON:  Chest CT, 12/08/2016.  Chest radiograph, 12/07/2016. FINDINGS: There is extensive consolidation in the right upper lobe was less dense consolidation in the right lower lobe. A smaller area of consolidation is noted in the left upper lobe. Findings are stable from the previous day's CT scan. Right lower lobe opacity has mildly increased when compared the prior chest radiograph. No convincing pleural effusion.  No pneumothorax. Heart is mildly enlarged. IMPRESSION: 1. Extensive areas of lung consolidation, mostly on the right, stable from the previous day's chest CT scan. Electronically Signed   By: Lajean Manes M.D.   On: 12/09/2016 11:03     ASSESSMENT and PLAN  Systemic vasculitis syndrome (HCC) Features c/w severe systemic  Vasculitis with BVASC score of 23 based on renal, pulmonary, skin involvement and sinus symptopms and headache at admit 12/07/2016   On 12/08/16 - started 1g  Solumedrol per day and week 1 of 4 rituxan  On 12/11/2016 - blood panel still pending   Plan - Sed rate 140/ RF 17.1 - Continue high dose solumedrol - Continue Weekly Ritxuan 4 doses; 12/15/16, 12/22/16 and 12/29/16 - Await G6PD and if normal start 1DS bactrim MWF>> pending - Renal biopsy  4/3: results pending - Skin biopsy 4/3: results pending - D/C Lucianne Lei - no evidence of infection - Continue zosyn for 4/3 /2018 continue for total of 8 days for now - OPD fu with Dr Chase Caller  Acute respiratory failure with hypoxia ([place]) Due to alv hge at admit 12/07/2016 - 4/3: Tolerating 3 L [place].  Plan - D/C BiPAP, 4L [place] - Will need home O2 (ambulatory desat study for home O2). - Continue [place] to maintain saturations >94% - Mobilize as able  Intra-alveolar hemorrhage Classic CT but focal R > L with associated hgb drop and hypoxemia.  Needed prb 12/07/16 and 12/09/16 - No recent bleeding noted - Anemia  Plan - Rx vasculitis as above - Monitor clinically - Transfuse for HGB < 7 - CBC daily  Acute renal failure (ARF) (HCC) - Dc ace inhibitor 12/08/16 - Renal biopsy per renal recommendations. 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 - Control bp with hydralaizine 50 mg TID, and prn - Hold ACE  FAMILY  - Updates: No family at bedside. Updated patient at bedside this am.  - Inter-disciplinary  family meet or Palliative Care meeting due by:  DAy 7. Current LOS is LOS 3 days   Transfer orders to tele and to [facility] service with PCCM following as pulmonary consult starting 4/3.   Magdalen Spatz, AGACNP-BC [place] Pager # [phone]  Attending Note:  61 year old female with pulmonary vasculitis presenting with hypoxemic respiratory failure.  Down to 4L [place] on exam.  On exam, bibasilar crackles noted.  CXR I reviewed myself, pulmonary infiltrate noted.  Discussed with PCCM-NP.  Hypertension:  - Increase hydralazine to 75 TID  Hypoxemia:  - Titrate O2 for sat of 88-92%  - Will need an ambulatory desat study on RA to qualify for home O2.  Vasculitis:  - Continue steroids at current dose for now  - Will probably change to PO over the next couple days with a very slow taper  - F/U on skin biopsy  Alveolar hemorrhage:  - Steroids  - Monitor clinically  Patient seen and examined, agree with above note.  I dictated the care and orders written for this patient under my direction.  Rush Farmer, MD [phone]  12/11/2016 8:46 AM

## 2016-12-11 NOTE — Telephone Encounter (Signed)
Catherine Aguilar  Please give Catherine Aguilar opd fu for vasculitis with an APP in 2 weeks from 12/11/2016 and 4 to 6 weeks with me. Make both now to facilitate dc planning  Thanks  Dr. Brand Males, M.D., [facility].C.P Pulmonary and Critical Care Medicine Staff Physician [place] [facility] Pager: [phone], If no answer or between  15:00h - 7:00h: call [phone]  12/11/2016 6:59 AM

## 2016-12-12 ENCOUNTER — Inpatient Hospital Stay (HOSPITAL_COMMUNITY): Payer: Medicaid Other

## 2016-12-12 DIAGNOSIS — R0489 Hemorrhage from other sites in respiratory passages: Secondary | ICD-10-CM

## 2016-12-12 DIAGNOSIS — R140 Abdominal distension (gaseous): Secondary | ICD-10-CM

## 2016-12-12 DIAGNOSIS — R042 Hemoptysis: Secondary | ICD-10-CM

## 2016-12-12 LAB — BPAM RBC
BLOOD PRODUCT EXPIRATION DATE: 201804172359
BLOOD PRODUCT EXPIRATION DATE: 201804202359
Blood Product Expiration Date: 201804172359
Blood Product Expiration Date: 201804172359
Blood Product Expiration Date: 201804222359
ISSUE DATE / TIME: 201803311011
ISSUE DATE / TIME: 201804010409
Unit Type and Rh: 6200
Unit Type and Rh: 6200
Unit Type and Rh: 6200
Unit Type and Rh: 6200
Unit Type and Rh: 6200

## 2016-12-12 LAB — TYPE AND SCREEN
ABO/RH(D): A POS
ABO/RH(D): A POS
Antibody Screen: NEGATIVE
Antibody Screen: NEGATIVE
UNIT DIVISION: 0
UNIT DIVISION: 0
Unit division: 0
Unit division: 0
Unit division: 0

## 2016-12-12 LAB — GLUCOSE, CAPILLARY
GLUCOSE-CAPILLARY: 156 mg/dL — AB (ref 65–99)
GLUCOSE-CAPILLARY: 206 mg/dL — AB (ref 65–99)
GLUCOSE-CAPILLARY: 370 mg/dL — AB (ref 65–99)
Glucose-Capillary: 219 mg/dL — ABNORMAL HIGH (ref 65–99)
Glucose-Capillary: 311 mg/dL — ABNORMAL HIGH (ref 65–99)
Glucose-Capillary: 301 mg/dL — ABNORMAL HIGH (ref 65–99)

## 2016-12-12 LAB — CBC
HCT: 28.3 % — ABNORMAL LOW (ref 36.0–46.0)
HEMOGLOBIN: 9.2 g/dL — AB (ref 12.0–15.0)
MCH: 24.4 pg — ABNORMAL LOW (ref 26.0–34.0)
MCHC: 32.5 g/dL (ref 30.0–36.0)
MCV: 75.1 fL — AB (ref 78.0–100.0)
PLATELETS: 633 10*3/uL — AB (ref 150–400)
RBC: 3.77 MIL/uL — AB (ref 3.87–5.11)
RDW: 16.8 % — ABNORMAL HIGH (ref 11.5–15.5)
WBC: 14 10*3/uL — AB (ref 4.0–10.5)

## 2016-12-12 LAB — COMPREHENSIVE METABOLIC PANEL
ALT: 18 U/L (ref 14–54)
AST: 16 U/L (ref 15–41)
Albumin: 2.9 g/dL — ABNORMAL LOW (ref 3.5–5.0)
Alkaline Phosphatase: 51 U/L (ref 38–126)
Anion gap: 17 — ABNORMAL HIGH (ref 5–15)
BUN: 108 mg/dL — ABNORMAL HIGH (ref 6–20)
CALCIUM: 8.6 mg/dL — AB (ref 8.9–10.3)
CHLORIDE: 102 mmol/L (ref 101–111)
CO2: 19 mmol/L — ABNORMAL LOW (ref 22–32)
CREATININE: 4.11 mg/dL — AB (ref 0.44–1.00)
GFR calc Af Amer: 12 mL/min — ABNORMAL LOW (ref >60)
GFR, EST NON AFRICAN AMERICAN: 11 mL/min — AB (ref >60)
Glucose, Bld: 194 mg/dL — ABNORMAL HIGH (ref 65–99)
Potassium: 2.8 mmol/L — ABNORMAL LOW (ref 3.5–5.1)
SODIUM: 138 mmol/L (ref 135–145)
Total Bilirubin: 0.7 mg/dL (ref 0.3–1.2)
Total Protein: 7.4 g/dL (ref 6.5–8.1)

## 2016-12-12 LAB — GLUCOSE 6 PHOSPHATE DEHYDROGENASE
G6PDH: 12.7 U/g{Hb} (ref 4.6–13.5)
HEMOGLOBIN: 6.6 g/dL — AB (ref 11.1–15.9)

## 2016-12-12 LAB — MAGNESIUM: MAGNESIUM: 2.1 mg/dL (ref 1.7–2.4)

## 2016-12-12 LAB — PHOSPHORUS: Phosphorus: 7.8 mg/dL — ABNORMAL HIGH (ref 2.5–4.6)

## 2016-12-12 MED ORDER — POTASSIUM CHLORIDE CRYS ER 20 MEQ PO TBCR
20.0000 meq | EXTENDED_RELEASE_TABLET | Freq: Once | ORAL | Status: AC
  Administered 2016-12-12: 20 meq via ORAL
  Filled 2016-12-12: qty 1

## 2016-12-12 MED ORDER — POTASSIUM CHLORIDE CRYS ER 20 MEQ PO TBCR
40.0000 meq | EXTENDED_RELEASE_TABLET | Freq: Once | ORAL | Status: AC
  Administered 2016-12-12: 40 meq via ORAL
  Filled 2016-12-12: qty 2

## 2016-12-12 MED ORDER — SULFAMETHOXAZOLE-TRIMETHOPRIM 800-160 MG PO TABS
1.0000 | ORAL_TABLET | ORAL | Status: DC
  Administered 2016-12-12 – 2016-12-14 (×2): 1 via ORAL
  Filled 2016-12-12 (×2): qty 1

## 2016-12-12 NOTE — Evaluation (Signed)
Physical Therapy Evaluation Patient Details Name: Catherine Aguilar MRN: [id number] DOB: January 07, 1956 Today's Date: 12/12/2016   History of Present Illness  Pt is a 61 y/o female admitted secondary to hemoptysis and generalized weakness. PMH includes smoker, marajuana use, HTN, chronic bronchitis, and CVA.   Clinical Impression  Pt admitted with above diagnosis. Pt currently with functional limitations due to the deficits listed below (see PT Problem List). PTA, pt states she was independent and lives alone. Reports she and her husband are separated, but he can help her as needed during the day. Reports other family can stay with her at night. Pt VERY limited by fatigue this session, however, unaware of current deficits. Pt required  forced rest breaks during mobility secondary to dizziness and SOB. Oxygen sats >90% on 2L. Pt not wanting to go to rehab and wants to remain independent. Initially refused to use RW, however, after education, seems more agreeable. Recommending follow up recommendations below.  Pt will benefit from skilled PT to increase their independence and safety with mobility to allow discharge to the venue listed below.  Will continue to follow.      Follow Up Recommendations Home health PT;Supervision/Assistance - 24 hour    Equipment Recommendations  Rolling walker with 5" wheels;3in1 (PT)    Recommendations for Other Services OT consult     Precautions / Restrictions Precautions Precautions: Other (comment) (Chemotherapy precautions ) Restrictions Weight Bearing Restrictions: No      Mobility  Bed Mobility Overal bed mobility: Modified Independent             General bed mobility comments: Requiring increased time. Pt with SOB noted after bed mobility. Required seated rest break for ~2-3 min.   Transfers Overall transfer level: Needs assistance Equipment used: None Transfers: Sit to/from Stand Sit to Stand: Supervision         General transfer comment:  Supervision for safety.   Ambulation/Gait Ambulation/Gait assistance: Min assist;Min guard Ambulation Distance (Feet): 50 Feet Assistive device:  (used IV pole secondary to fatigue) Gait Pattern/deviations: Step-through pattern;Decreased stride length;Trunk flexed Gait velocity: Decreased Gait velocity interpretation: Below normal speed for age/gender General Gait Details: Pt extremely limited by fatigue this session. Refused to use RW, but required IV pole for support. Pt reports she is very independent and wants to push through. Pt reporting dizziness during gait and required 3 forced standing rest breaks secondary to SOB. Oxygen sats >90% on 2L of oxygen. Pt educated about use of RW at home and importance of energy conservation and activity pacing.   Stairs            Wheelchair Mobility    Modified Rankin (Stroke Patients Only)       Balance Overall balance assessment: Needs assistance Sitting-balance support: No upper extremity supported;Feet supported Sitting balance-Leahy Scale: Good     Standing balance support: No upper extremity supported Standing balance-Leahy Scale: Fair Standing balance comment: static standing                             Pertinent Vitals/Pain Pain Assessment: No/denies pain    Home Living Family/patient expects to be discharged to:: Private residence Living Arrangements: Alone Available Help at Discharge: Family;Available 24 hours/day (husband available ) Type of Home: House Home Access: Stairs to enter Entrance Stairs-Rails: None Entrance Stairs-Number of Steps: 2 Home Layout: One level Home Equipment: Bedside commode Additional Comments: Pt is not with husband, but states he is still supportive  and can check in on her. Pt also reports she has good neighbors and other family that can check on her throughout the day and at night.     Prior Function Level of Independence: Independent         Comments: Refused to take  RW and WC home from rehab after CVA. States she is independent and does not need.      Hand Dominance   Dominant Hand: Right    Extremity/Trunk Assessment   Upper Extremity Assessment Upper Extremity Assessment: Generalized weakness    Lower Extremity Assessment Lower Extremity Assessment: Generalized weakness (Grossly 3+/5 throughout BLE )    Cervical / Trunk Assessment Cervical / Trunk Assessment: Kyphotic  Communication   Communication: No difficulties  Cognition Arousal/Alertness: Awake/alert Behavior During Therapy: WFL for tasks assessed/performed Overall Cognitive Status: Within Functional Limits for tasks assessed                                        General Comments General comments (skin integrity, edema, etc.): Attempted session without oxygen. Pt at 88% on RA at beginning of session so 2L reapplied. Oxygen sats >90% on 2L throughout session. Recorded at 95% at end of session. Educated about assist required at home and recommendations for HHPT. Pt not wanting to go to rehab stating she is "independent".     Exercises     Assessment/Plan    PT Assessment Patient needs continued PT services  PT Problem List Decreased strength;Decreased activity tolerance;Decreased balance;Decreased mobility;Decreased safety awareness;Decreased knowledge of use of DME;Decreased knowledge of precautions;Cardiopulmonary status limiting activity       PT Treatment Interventions DME instruction;Gait training;Stair training;Therapeutic activities;Functional mobility training;Therapeutic exercise;Balance training;Neuromuscular re-education;Patient/family education    PT Goals (Current goals can be found in the Care Plan section)  Acute Rehab PT Goals Patient Stated Goal: to go home and be independent PT Goal Formulation: With patient Time For Goal Achievement: 12/26/16 Potential to Achieve Goals: Good    Frequency Min 3X/week   Barriers to discharge         Co-evaluation               End of Session Equipment Utilized During Treatment: Gait belt;Oxygen Activity Tolerance: Patient limited by fatigue Patient left: in bed;with call bell/phone within reach Nurse Communication: Mobility status PT Visit Diagnosis: Other abnormalities of gait and mobility (R26.89);Muscle weakness (generalized) (M62.81)    Time: 3267-1245 PT Time Calculation (min) (ACUTE ONLY): 25 min   Charges:   PT Evaluation $PT Eval Moderate Complexity: 1 Procedure PT Treatments $Gait Training: 8-22 mins   PT G Codes:        Nicky Pugh, PT, DPT  Acute Rehabilitation Services  Pager: [phone]   Army Melia 12/12/2016, 4:20 PM

## 2016-12-12 NOTE — Progress Notes (Signed)
Patient resting comfortably on 4L [place] with no respiratory distress noted. BIPAP not needed at this time. RT will monitor as needed. 

## 2016-12-12 NOTE — Progress Notes (Signed)
Inpatient Diabetes Program Recommendations  AACE/ADA: New Consensus Statement on Inpatient Glycemic Control (2015)  Target Ranges:  Prepandial:   less than 140 mg/dL      Peak postprandial:   less than 180 mg/dL (1-2 hours)      Critically ill patients:  140 - 180 mg/dL   Lab Results  Component Value Date   GLUCAP 206 (H) 12/12/2016   HGBA1C 6.8 (H) 10/23/2015    Review of Glycemic Control Results for Catherine Aguilar, Catherine Aguilar (MRN [id number]) as of 12/12/2016 10:01  Ref. Range 12/11/2016 15:52 12/11/2016 19:55 12/12/2016 00:29 12/12/2016 04:45 12/12/2016 08:17  Glucose-Capillary Latest Ref Range: 65 - 99 mg/dL 297 (H) 366 (H) 156 (H) 219 (H) 206 (H)   Diabetes history: DM2 Outpatient Diabetes medications: Metformin 500 qd Current orders for Inpatient glycemic control: Novolog correction 0-20 units q 4 hrs.  Inpatient Diabetes Program Recommendations:    CBGs elevated up to 366 post prandial. Please consider: -Novolog 4 units tid meal coverage if eats 50% (while on steroids) -Carb modified diet  Thank you, Nani Gasser. [place] Hanlon, RN, MSN, CDE Inpatient Glycemic Control Team Team Pager [phone] (8am-5pm) 12/12/2016 10:15 AM

## 2016-12-12 NOTE — Progress Notes (Signed)
PULMONARY / CRITICAL CARE MEDICINE   Name: Catherine Aguilar MRN: [id number] DOB: 1955-12-12 PCP Maggie Font, MD LOS 4 as of 12/12/2016     ADMISSION DATE:  12/07/2016 CONSULTATION DATE:  12/09/16    HPI: chronic sinusitis nos x 2 years along with upper back skin lesions and headache nos without relief. Now with features of alveolra hemorrage on CT with acute renal failure and urine with RBC numberous. BVASC score of 23 at admit. She smokes tobacco and MJ but  denies cocaine. Started IV steroids and Week 1 of 4 Rituxan 375mg /BSAm2. Vasculitis and autoimmune panel pending 12/09/16 G6PD pending   SUBJECTIVE/OVERNIGHT/INTERVAL HX Appears comfortable on O2   VITAL SIGNS: BP (!) 148/81 (BP Location: Right Arm)   Pulse 77   Temp 98.2 F (36.8 C) (Oral)   Resp 17   Ht 5\' 9"  (1.753 m)   Wt 86.3 kg (190 lb 4.1 oz)   SpO2 94%   BMI 28.10 kg/m   HEMODYNAMICS:    VENTILATOR SETTINGS:    INTAKE / OUTPUT: I/O last 3 completed shifts: In: 9629 [P.O.:480; I.V.:270; IV Piggyback:535] Out: 2053 [Urine:2051; Stool:2]  EXAM  General: Alert , oriented and appropriate, female supine in bed wearing nasal cannula. No complaints, wants to go home. Heart: RRR, no rubs murmurs or gallop Lung: Clear throughout, diminished per bases Abdomen: BS +, soft, flat, non-distended, non-tender to palpation Ext: -edema and -tenderness, no deformities noted Neuro: Alert, oriented x 3,  and  appropriate Skin: Warm, dry and intact  LABS  PULMONARY  Recent Labs Lab 12/08/16 0130  PHART 7.258*  PCO2ART 28.7*  PO2ART 93.0  HCO3 12.4*  O2SAT 95.1   CBC  Recent Labs Lab 12/09/16 0839 12/09/16 1809 12/12/16 0823  HGB 7.8* 7.7* 9.2*  HCT 23.9* 23.5* 28.3*  WBC 10.7* 11.6* 14.0*  PLT 476* 497* 633*   COAGULATION  Recent Labs Lab 12/08/16 0001 12/10/16 0248  INR 1.21 1.35   CARDIAC  Recent Labs Lab 12/08/16 1520 12/08/16 1851 12/09/16 0212  TROPONINI <0.03 <0.03 <0.03   No  results for input(s): PROBNP in the last 168 hours.  CHEMISTRY  Recent Labs Lab 12/07/16 2313 12/08/16 0911 12/09/16 5284 12/10/16 0248 12/11/16 0148 12/12/16 0823  NA 135 136  --  138  --  138  K 4.3 4.7  --  3.8  --  2.8*  CL 110 111  --  107  --  102  CO2 14* 13*  --  15*  --  19*  GLUCOSE 189* 187*  --  188*  --  194*  BUN 77* 65*  --  85*  --  108*  CREATININE 4.41* 4.26* 4.28* 4.49* 4.52* 4.11*  CALCIUM 8.9 8.4*  --  8.9  --  8.6*  MG  --   --   --  2.0 2.0 2.1  PHOS  --   --   --  6.5* 6.2* 7.8*   Estimated Creatinine Clearance: 16.8 mL/min (A) (by C-G formula based on SCr of 4.11 mg/dL (H)).  LIVER  Recent Labs Lab 12/08/16 0001 12/10/16 0248 12/12/16 0823  AST 36 15 16  ALT 30 23 18   ALKPHOS 79 64 51  BILITOT 0.5 0.7 0.7  PROT 7.6 7.3 7.4  ALBUMIN 2.9* 2.8* 2.9*  INR 1.21 1.35  --    INFECTIOUS  Recent Labs Lab 12/08/16 0143 12/08/16 0836 12/09/16 0212 12/10/16 0248  LATICACIDVEN 1.1  --   --   --   PROCALCITON  --  3.33 3.90 2.80   ENDOCRINE CBG (last 3)   Recent Labs  12/12/16 0029 12/12/16 0445 12/12/16 0817  GLUCAP 156* 219* 206*   IMAGING x48h  - image(s) personally visualized  -   highlighted in bold Dg Chest Port 1 View  Result Date: 12/12/2016 CLINICAL DATA:  Respiratory failure. EXAM: PORTABLE CHEST 1 VIEW COMPARISON:  12/09/2016.  CT 12/08/2016. FINDINGS: Persistent diffuse right lung infiltrate particularly prominent in the right upper lobe. Findings have improved slightly from prior exam. Mild left mid lung field subsegmental atelectasis. No pleural effusion or pneumothorax. Stable cardiomegaly. No pulmonary venous congestion . IMPRESSION: Persistent diffuse right lung infiltrate, particularly prominent in the right upper lobe. Findings have improved slightly from prior exam. Electronically Signed   By: Casar   On: 12/12/2016 07:35     ASSESSMENT and PLAN  Systemic vasculitis syndrome ([place]) Features c/w severe  systemic Vasculitis with BVASC score of 23 based on renal, pulmonary, skin involvement and sinus symptopms and headache at admit 12/07/2016   On 12/08/16 - started 1g  Solumedrol per day and week 1 of 4 rituxan  On 12/11/2016 - blood panel still pending   Plan - Sed rate 140/ RF 17.1 - Continue high dose solumedrol - Continue Weekly Ritxuan 4 doses; 12/15/16, 12/22/16 and 12/29/16 - Await G6PD and if normal start 1DS bactrim MWF>> pending - Renal biopsy  4/3: results pending - Skin biopsy 4/3: results pending - D/C Lucianne Lei - no evidence of infection - Continue zosyn for 4/3 /2018 continue for total of 8 days for now - OPD fu with Dr Chase Caller  Acute respiratory failure with hypoxia ([place]) Due to alv hge at admit 12/07/2016 - 4/3: Tolerating 3 L [place].  Plan - D/C BiPAP, 4L [place] - Will need home O2 (ambulatory desat study for home O2). - Continue  to maintain saturations >94% - Mobilize as able  Intra-alveolar hemorrhage Classic CT but focal R > L with associated hgb drop and hypoxemia.  Needed prb 12/07/16 and 12/09/16 - No recent bleeding noted - Anemia  Plan - Rx vasculitis as above - Monitor clinically - Transfuse for HGB < 7 - CBC daily  Acute renal failure (ARF) (HCC) Lab Results  Component Value Date   CREATININE 4.11 (H) 12/12/2016   CREATININE 4.52 (H) 12/11/2016   CREATININE 4.49 (H) 12/10/2016    - Dc ace inhibitor 12/08/16 - Renal biopsy per renal recommendations. 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 - Control bp with  hydralaizine 50 mg TID, and prn - Hold ACE  FAMILY  - Updates: No family at bedside. Updated patient at bedside this am.  - Inter-disciplinary family meet or Palliative Care meeting due by:  DAy 7. Current LOS is LOS 4 days   Transfer orders to tele and to [facility] service with PCCM following as pulmonary consult starting 4/3.   Richardson Landry Markie Frith ACNP Maryanna Shape PCCM Pager [phone] till 3 pm If no answer page [phone] 12/12/2016, 11:08 AM

## 2016-12-12 NOTE — Progress Notes (Signed)
PROGRESS NOTE    Catherine Aguilar  CBJ:[id number] DOB: [birth year] DOA: 12/07/2016 PCP: Maggie Font, MD   Brief Narrative: Catherine Aguilar is a 61 y.o. female with history of substance abuse. She presented with dyspnea and hemoptysis with concerns for autoimmune vasculitis.   Assessment & Plan:   Principal Problem:   Systemic vasculitis syndrome (HCC) Active Problems:   CVA (cerebral vascular accident) ([place])   Essential hypertension   HLD (hyperlipidemia)   Tobacco abuse   AKI (acute kidney injury) ([place])   Sepsis ([place])   Hemoptysis   CAP (community acquired pneumonia)   Symptomatic anemia   Acute respiratory failure with hypoxia (HCC)   Wegener's granulomatosis with renal involvement ([place])   Intra-alveolar hemorrhage   Acute renal failure (ARF) (HCC)   Cutaneous vasculitis   Substance abuse   Goals of care, counseling/discussion   Possible systemic vasculitis syndrome Involvement of lungs, sinuses and kidneys. CRP, RF, myeloperoxidase antibodies elevated -Continue IV steroids and tiruximab -skin biopsy pending -?kidney biopsy. Cannot find order. Patient does not think this was performed.  Acute hypoxic respiratory failure secondary to alveolar hemorrhage Improved. S/p 2 units of PRBC -Continue to wean oxygen  History of substance abuse Counseled  Essential hypertension -continued hydralazine  History of CVA -continue statin -continue to hold aspirin secondary to bleeding  Alcohol abuse -CIWA at admission  Acute kidney injury Stable.   DVT prophylaxis: SCDs Code Status: Full code Family Communication: Daughter at bedside Disposition Plan: Discharge home pending continued workup   Consultants:   PCCM/Pulmonology  General surgery  Procedures:   Skin biopsy  Antimicrobials:  Zosyn    Subjective: Patient reports some mild hemoptysis which has improved. No chest pain or dyspnea. No abdominal pain. She is making urine.  Objective: Vitals:    12/12/16 0812 12/12/16 1242 12/12/16 1432 12/12/16 1534  BP:  (!) 171/90  (!) 147/72  Pulse:  77  70  Resp:  20    Temp:  98.2 F (36.8 C)    TempSrc:      SpO2: 94% 96% 95%   Weight:      Height:        Intake/Output Summary (Last 24 hours) at 12/12/16 1647 Last data filed at 12/12/16 1459  Gross per 24 hour  Intake             1226 ml  Output             2551 ml  Net            -1325 ml   Filed Weights   12/08/16 0420 12/11/16 2102 12/12/16 0653  Weight: 95.4 kg (210 lb 5.1 oz) 86.5 kg (190 lb 11.2 oz) 86.3 kg (190 lb 4.1 oz)    Examination:  General exam: Appears calm and comfortable Respiratory system: Clear to auscultation. Respiratory effort normal. Cardiovascular system: S1 & S2 heard, RRR. No murmurs Gastrointestinal system: Abdomen is nondistended, soft and nontender. Normal bowel sounds heard. Central nervous system: Alert and oriented. No focal neurological deficits. Extremities: No edema. No calf tenderness Skin: No cyanosis. Non blanching macular rash on back                                      Psychiatry: Judgement and insight appear normal. Mood & affect appropriate.     Data Reviewed: I have personally reviewed following labs and imaging studies  CBC:  Recent  Labs Lab 12/07/16 2313  12/08/16 0909 12/08/16 1853 12/09/16 0212 12/09/16 0839 12/09/16 1809 12/12/16 0823  WBC 10.8*  < > 10.6* 9.5 9.1 10.7* 11.6* 14.0*  NEUTROABS 8.0*  --  8.6*  --   --   --   --   --   HGB 6.1*  < > 5.3* 6.3* 6.2* 7.8* 7.7* 9.2*  HCT 19.0*  < > 16.9* 19.5* 18.8* 23.9* 23.5* 28.3*  MCV 75.4*  < > 74.8* 74.7* 74.0* 76.1* 75.1* 75.1*  PLT 512*  < > 456* 413* 421* 476* 497* 633*  < > = values in this interval not displayed. Basic Metabolic Panel:  Recent Labs Lab 12/07/16 2313 12/08/16 0911 12/09/16 0212 12/10/16 0248 12/11/16 0148 12/12/16 0823  NA 135 136  --  138  --  138  K 4.3 4.7  --  3.8  --  2.8*  CL 110 111  --  107  --  102  CO2 14* 13*  --   15*  --  19*  GLUCOSE 189* 187*  --  188*  --  194*  BUN 77* 65*  --  85*  --  108*  CREATININE 4.41* 4.26* 4.28* 4.49* 4.52* 4.11*  CALCIUM 8.9 8.4*  --  8.9  --  8.6*  MG  --   --   --  2.0 2.0 2.1  PHOS  --   --   --  6.5* 6.2* 7.8*   GFR: Estimated Creatinine Clearance: 16.8 mL/min (A) (by C-G formula based on SCr of 4.11 mg/dL (H)). Liver Function Tests:  Recent Labs Lab 12/08/16 0001 12/10/16 0248 12/12/16 0823  AST 36 15 16  ALT 30 23 18   ALKPHOS 79 64 51  BILITOT 0.5 0.7 0.7  PROT 7.6 7.3 7.4  ALBUMIN 2.9* 2.8* 2.9*   No results for input(s): LIPASE, AMYLASE in the last 168 hours. No results for input(s): AMMONIA in the last 168 hours. Coagulation Profile:  Recent Labs Lab 12/08/16 0001 12/10/16 0248  INR 1.21 1.35   Cardiac Enzymes:  Recent Labs Lab 12/08/16 0836 12/08/16 1520 12/08/16 1851 12/09/16 0212  CKTOTAL 749*  --   --   --   CKMB 12.3*  --   --   --   TROPONINI  --  <0.03 <0.03 <0.03   BNP (last 3 results) No results for input(s): PROBNP in the last 8760 hours. HbA1C: No results for input(s): HGBA1C in the last 72 hours. CBG:  Recent Labs Lab 12/12/16 0029 12/12/16 0445 12/12/16 0817 12/12/16 1150 12/12/16 1623  GLUCAP 156* 219* 206* 311* 301*   Lipid Profile: No results for input(s): CHOL, HDL, LDLCALC, TRIG, CHOLHDL, LDLDIRECT in the last 72 hours. Thyroid Function Tests: No results for input(s): TSH, T4TOTAL, FREET4, T3FREE, THYROIDAB in the last 72 hours. Anemia Panel: No results for input(s): VITAMINB12, FOLATE, FERRITIN, TIBC, IRON, RETICCTPCT in the last 72 hours. Sepsis Labs:  Recent Labs Lab 12/08/16 0122 12/08/16 0143 12/08/16 0836 12/09/16 0212 12/10/16 0248  PROCALCITON 2.66  --  3.33 3.90 2.80  LATICACIDVEN  --  1.1  --   --   --     Recent Results (from the past 240 hour(s))  Culture, blood (routine x 2) Call MD if unable to obtain prior to antibiotics being given     Status: None (Preliminary result)    Collection Time: 12/07/16 11:10 PM  Result Value Ref Range Status   Specimen Description BLOOD BLOOD RIGHT FOREARM  Final   Special  Requests BOTTLES DRAWN AEROBIC AND ANAEROBIC 5ML  Final   Culture   Final    NO GROWTH 4 DAYS Performed at [facility], [place] [address], [place], [place] [address]    Report Status PENDING  Incomplete  Culture, blood (routine x 2) Call MD if unable to obtain prior to antibiotics being given     Status: None (Preliminary result)   Collection Time: 12/07/16 11:12 PM  Result Value Ref Range Status   Specimen Description BLOOD LEFT HAND  Final   Special Requests BOTTLES DRAWN AEROBIC ONLY 6ML  Final   Culture   Final    NO GROWTH 4 DAYS Performed at [facility], [address] [place], [place] [address]    Report Status PENDING  Incomplete  Culture, sputum-assessment     Status: None   Collection Time: 12/08/16  1:08 AM  Result Value Ref Range Status   Specimen Description EXPECTORATED SPUTUM  Final   Special Requests NONE  Final   Sputum evaluation   Final    THIS SPECIMEN IS ACCEPTABLE FOR SPUTUM CULTURE Performed at [facility], [address] [place], [place] [address]    Report Status 12/08/2016 FINAL  Final  Culture, respiratory (NON-Expectorated)     Status: None   Collection Time: 12/08/16  1:08 AM  Result Value Ref Range Status   Specimen Description EXPECTORATED SPUTUM  Final   Special Requests NONE Reflexed from [id number]  Final   Gram Stain   Final    FEW SQUAMOUS EPITHELIAL CELLS PRESENT FEW WBC PRESENT, PREDOMINANTLY MONONUCLEAR FEW GRAM POSITIVE RODS FEW GRAM POSITIVE COCCI IN PAIRS FEW GRAM NEGATIVE RODS    Culture   Final    Consistent with normal respiratory flora. Performed at [facility], [place] [address] [place], [place] [address]    Report Status 12/10/2016 FINAL  Final  MRSA PCR Screening     Status: None   Collection Time: 12/08/16  4:22 AM  Result Value Ref Range Status   MRSA by PCR NEGATIVE  NEGATIVE Final    Comment:        The GeneXpert MRSA Assay (FDA approved for NASAL specimens only), is one component of a comprehensive MRSA colonization surveillance program. It is not intended to diagnose MRSA infection nor to guide or monitor treatment for MRSA infections.   Respiratory Panel by PCR     Status: None   Collection Time: 12/08/16  8:30 AM  Result Value Ref Range Status   Adenovirus NOT DETECTED NOT DETECTED Final   Coronavirus 229E NOT DETECTED NOT DETECTED Final   Coronavirus HKU1 NOT DETECTED NOT DETECTED Final   Coronavirus NL63 NOT DETECTED NOT DETECTED Final   Coronavirus OC43 NOT DETECTED NOT DETECTED Final   Metapneumovirus NOT DETECTED NOT DETECTED Final   Rhinovirus / Enterovirus NOT DETECTED NOT DETECTED Final   Influenza A NOT DETECTED NOT DETECTED Final   Influenza B NOT DETECTED NOT DETECTED Final   Parainfluenza Virus 1 NOT DETECTED NOT DETECTED Final   Parainfluenza Virus 2 NOT DETECTED NOT DETECTED Final   Parainfluenza Virus 3 NOT DETECTED NOT DETECTED Final   Parainfluenza Virus 4 NOT DETECTED NOT DETECTED Final   Respiratory Syncytial Virus NOT DETECTED NOT DETECTED Final   Bordetella pertussis NOT DETECTED NOT DETECTED Final   Chlamydophila pneumoniae NOT DETECTED NOT DETECTED Final   Mycoplasma pneumoniae NOT DETECTED NOT DETECTED Final         Radiology Studies: Dg  Chest Port 1 View  Result Date: 12/12/2016 CLINICAL DATA:  Respiratory failure. EXAM: PORTABLE CHEST 1 VIEW COMPARISON:  12/09/2016.  CT 12/08/2016. FINDINGS: Persistent diffuse right lung infiltrate particularly prominent in the right upper lobe. Findings have improved slightly from prior exam. Mild left mid lung field subsegmental atelectasis. No pleural effusion or pneumothorax. Stable cardiomegaly. No pulmonary venous congestion . IMPRESSION: Persistent diffuse right lung infiltrate, particularly prominent in the right upper lobe. Findings have improved slightly from prior  exam. Electronically Signed   By: [place]   On: 12/12/2016 07:35        Scheduled Meds: . dextromethorphan-guaiFENesin  1 tablet Oral BID  . folic acid  1 mg Oral Daily  . furosemide  40 mg Intravenous Q12H  . hydrALAZINE  75 mg Oral TID  . insulin aspart  0-20 Units Subcutaneous Q4H  . ipratropium  0.5 mg Nebulization Q6H  . levalbuterol  1.25 mg Nebulization Q6H  . lidocaine-EPINEPHrine  20 mL Other Once  . methylPREDNISolone (SOLU-MEDROL) injection  250 mg Intravenous Q6H  . multivitamin with minerals  1 tablet Oral Daily  . nicotine  21 mg Transdermal Daily  . pantoprazole  40 mg Oral Daily  . piperacillin-tazobactam (ZOSYN)  IV  2.25 g Intravenous Q8H  . polyethylene glycol  17 g Oral Daily  . potassium chloride  20 mEq Oral Once  . thiamine  100 mg Oral Daily   Continuous Infusions: . sodium chloride 10 mL/hr at 12/11/16 2000     LOS: 4 days     Cordelia Poche, MD [facility] 12/12/2016, 4:47 PM Pager: [phone]  If 7PM-7AM, please contact night-coverage [email or web address] Password [facility] 12/12/2016, 4:47 PM

## 2016-12-13 DIAGNOSIS — M3131 Wegener's granulomatosis with renal involvement: Secondary | ICD-10-CM

## 2016-12-13 LAB — CULTURE, BLOOD (ROUTINE X 2)
Culture: NO GROWTH
Culture: NO GROWTH

## 2016-12-13 LAB — BASIC METABOLIC PANEL
Anion gap: 15 (ref 5–15)
BUN: 103 mg/dL — ABNORMAL HIGH (ref 6–20)
CO2: 20 mmol/L — ABNORMAL LOW (ref 22–32)
Calcium: 8.7 mg/dL — ABNORMAL LOW (ref 8.9–10.3)
Chloride: 102 mmol/L (ref 101–111)
Creatinine, Ser: 4.02 mg/dL — ABNORMAL HIGH (ref 0.44–1.00)
GFR calc Af Amer: 13 mL/min — ABNORMAL LOW (ref >60)
GFR calc non Af Amer: 11 mL/min — ABNORMAL LOW (ref >60)
Glucose, Bld: 142 mg/dL — ABNORMAL HIGH (ref 65–99)
Potassium: 2.8 mmol/L — ABNORMAL LOW (ref 3.5–5.1)
Sodium: 137 mmol/L (ref 135–145)

## 2016-12-13 LAB — CBC
HCT: 26.4 % — ABNORMAL LOW (ref 36.0–46.0)
Hemoglobin: 8.9 g/dL — ABNORMAL LOW (ref 12.0–15.0)
MCH: 25 pg — ABNORMAL LOW (ref 26.0–34.0)
MCHC: 33.7 g/dL (ref 30.0–36.0)
MCV: 74.2 fL — ABNORMAL LOW (ref 78.0–100.0)
Platelets: 577 10*3/uL — ABNORMAL HIGH (ref 150–400)
RBC: 3.56 MIL/uL — ABNORMAL LOW (ref 3.87–5.11)
RDW: 17.1 % — ABNORMAL HIGH (ref 11.5–15.5)
WBC: 14.7 10*3/uL — ABNORMAL HIGH (ref 4.0–10.5)

## 2016-12-13 LAB — GLUCOSE, CAPILLARY
GLUCOSE-CAPILLARY: 364 mg/dL — AB (ref 65–99)
Glucose-Capillary: 148 mg/dL — ABNORMAL HIGH (ref 65–99)
Glucose-Capillary: 153 mg/dL — ABNORMAL HIGH (ref 65–99)
Glucose-Capillary: 210 mg/dL — ABNORMAL HIGH (ref 65–99)
Glucose-Capillary: 242 mg/dL — ABNORMAL HIGH (ref 65–99)
Glucose-Capillary: 322 mg/dL — ABNORMAL HIGH (ref 65–99)

## 2016-12-13 LAB — CRYOGLOBULIN

## 2016-12-13 LAB — MAGNESIUM: Magnesium: 2.1 mg/dL (ref 1.7–2.4)

## 2016-12-13 LAB — PHOSPHORUS: Phosphorus: 7.3 mg/dL — ABNORMAL HIGH (ref 2.5–4.6)

## 2016-12-13 MED ORDER — POTASSIUM CHLORIDE CRYS ER 20 MEQ PO TBCR
40.0000 meq | EXTENDED_RELEASE_TABLET | Freq: Two times a day (BID) | ORAL | Status: AC
  Administered 2016-12-13 – 2016-12-14 (×4): 40 meq via ORAL
  Filled 2016-12-13 (×4): qty 2

## 2016-12-13 MED ORDER — DARBEPOETIN ALFA 150 MCG/0.3ML IJ SOSY
150.0000 ug | PREFILLED_SYRINGE | INTRAMUSCULAR | Status: DC
  Administered 2016-12-13: 150 ug via SUBCUTANEOUS
  Filled 2016-12-13: qty 0.3

## 2016-12-13 MED ORDER — LEVALBUTEROL HCL 1.25 MG/0.5ML IN NEBU
1.2500 mg | INHALATION_SOLUTION | Freq: Three times a day (TID) | RESPIRATORY_TRACT | Status: DC
Start: 2016-12-13 — End: 2016-12-15
  Administered 2016-12-13 – 2016-12-15 (×6): 1.25 mg via RESPIRATORY_TRACT
  Filled 2016-12-13 (×7): qty 0.5

## 2016-12-13 MED ORDER — IPRATROPIUM BROMIDE 0.02 % IN SOLN
0.5000 mg | Freq: Three times a day (TID) | RESPIRATORY_TRACT | Status: DC
  Administered 2016-12-13 – 2016-12-15 (×6): 0.5 mg via RESPIRATORY_TRACT
  Filled 2016-12-13 (×7): qty 2.5

## 2016-12-13 MED ORDER — PREDNISONE 50 MG PO TABS
80.0000 mg | ORAL_TABLET | Freq: Every day | ORAL | Status: DC
  Administered 2016-12-14 – 2016-12-16 (×3): 80 mg via ORAL
  Filled 2016-12-13 (×3): qty 1

## 2016-12-13 NOTE — Consult Note (Signed)
[facility] Renal Consultation Note  Requesting MD: [place] Indication for Consultation: presumed ANCA vasculitis  HPI:  Catherine Aguilar is a 61 y.o. female with past medical history significant for hypertension, hyperlipidemia.  She was noted to present to the emergency department on 2/12/ 2018 with epistaxis but then presented again to the emergency room on 3/30 with a several week history of hemoptysis and shortness of breath. Creatinine at that time was 4.4 with previous measurement had been 0.8 in September 2017. She was on indocin and lisinopril PTA but has not been on them since admission. She also had infiltrate on chest x-ray consistent with alveolar hemorrhage she was seen by critical care/pulmonary and started on high-dose Solu-Medrol as well as received  Rituxan on 3/31.  Labs subsequently showed ANCA positive at 1-320 -  Myeloperoxidase ab At a titer of 48- normal complements and normal anti-GBM.  Apparently, nephrology was called earlier in her hospital course but had not seen her. Her creatinine has remained abnormal but no worse in the fours.  Plans were being made for renal biopsy the patient was too unstable until now - it is now planned for 4/6.  Patient has been hypertensive- urine output has been robust.  She remains on solumedrol 1 gram daily for the last 5 days.  Is now down to 2 liters of [place].  Overall she feels improved and she thinks ready for discharge.  She does not appear to be having uremic symptoms  Creatinine, Ser  Date/Time Value Ref Range Status  12/13/2016 05:22 AM 4.02 (H) 0.44 - 1.00 mg/dL Final  12/12/2016 08:23 AM 4.11 (H) 0.44 - 1.00 mg/dL Final  12/11/2016 01:48 AM 4.52 (H) 0.44 - 1.00 mg/dL Final  12/10/2016 02:48 AM 4.49 (H) 0.44 - 1.00 mg/dL Final  12/09/2016 02:12 AM 4.28 (H) 0.44 - 1.00 mg/dL Final  12/08/2016 09:11 AM 4.26 (H) 0.44 - 1.00 mg/dL Final  12/07/2016 11:13 PM 4.41 (H) 0.44 - 1.00 mg/dL Final  06/05/2016 10:51 PM 0.80 0.44 -  1.00 mg/dL Final  10/23/2015 09:25 AM 1.00 0.44 - 1.00 mg/dL Final  07/07/2012 04:53 AM 0.86 0.50 - 1.10 mg/dL Final  07/03/2012 05:05 AM 0.90 0.50 - 1.10 mg/dL Final  07/02/2012 05:25 AM 0.84 0.50 - 1.10 mg/dL Final  07/01/2012 09:50 AM 0.75 0.50 - 1.10 mg/dL Final     PMHx:   Past Medical History:  Diagnosis Date  . High cholesterol   . Hypertension    stopped taking medications 10 years ago  . Stroke [facility])     Past Surgical History:  Procedure Laterality Date  . ABDOMINAL HYSTERECTOMY  1985    Family Hx:  Family History  Problem Relation Age of Onset  . Cancer Father   . Heart attack Mother     Social History:  reports that she has been smoking Cigarettes.  She has a 20.00 pack-year smoking history. She has never used smokeless tobacco. She reports that she drinks alcohol. She reports that she uses drugs, including Marijuana.  Allergies: No Known Allergies  Medications: Prior to Admission medications   Medication Sig Start Date End Date Taking? Authorizing Provider  aspirin 325 MG tablet Take 1 tablet (325 mg total) by mouth daily. 07/07/12  Yes Ivan Anchors Love, PA-C  fluticasone (FLONASE) 50 MCG/ACT nasal spray Place 1 spray into the nose daily as needed for congestion. 10/10/16  Yes Historical Provider, MD  gabapentin (NEURONTIN) 300 MG capsule Take 1 capsule (300 mg total) by mouth 2 (two)  times daily. For numbness left side Patient taking differently: Take 600 mg by mouth 2 (two) times daily. 300 mg in the morning 900 mg in the evening 07/07/12  Yes Ivan Anchors Love, PA-C  hydrALAZINE (APRESOLINE) 50 MG tablet Take 50 mg by mouth 3 (three) times daily. 11/01/16  Yes Historical Provider, MD  indomethacin (INDOCIN) 25 MG capsule Take 1 capsule (25 mg total) by mouth 3 (three) times daily as needed. Patient taking differently: Take 25 mg by mouth 3 (three) times daily with meals.  09/11/15  Yes Antonietta Breach, PA-C  lisinopril (PRINIVIL) 10 MG tablet Take 1 tablet (10 mg total)  by mouth daily. 08/04/12  Yes Meredith Staggers, MD  loratadine (CLARITIN) 10 MG tablet TAKE ONE TABLET BY MOUTH EVERY DAY FOR NASAL CONGESTION 09/19/16  Yes Historical Provider, MD  losartan (COZAAR) 100 MG tablet Take 100 mg by mouth daily. 05/08/16  Yes Historical Provider, MD  metFORMIN (GLUCOPHAGE) 500 MG tablet TAKE 500 MG BY MOUTH AT BREAKFAST AND SUPPER 09/01/16  Yes Historical Provider, MD  pravastatin (PRAVACHOL) 40 MG tablet Take 1 tablet (40 mg total) by mouth daily. For elevated cholesterol 07/07/12  Yes Ivan Anchors Love, PA-C  PROVENTIL HFA 108 (90 Base) MCG/ACT inhaler INHALE 2 PUFFS EVERY 4-6 HOURS PRN FOR SOB AND WHEEZING 09/01/16  Yes Historical Provider, MD  sodium chloride (OCEAN) 0.65 % SOLN nasal spray Place 1 spray into both nostrils 3 (three) times daily.   Yes Historical Provider, MD  azithromycin (ZITHROMAX) 250 MG tablet Take 1 tablet (250 mg total) by mouth daily. Take 1 every day until finished. Received her first dose in the ED. Take your first dose on Monday, 10/23/2005 Patient not taking: Reported on 06/05/2016 10/24/15   Ottie Glazier, PA-C  fluticasone (FLONASE) 50 MCG/ACT nasal spray Place 2 sprays into both nostrils daily. Patient not taking: Reported on 10/22/2016 10/10/16 10/24/16  Duffy Bruce, MD  hydrALAZINE (APRESOLINE) 25 MG tablet Take 2 tablets (50 mg total) by mouth 3 (three) times daily. Patient not taking: Reported on 12/07/2016 08/04/12   Meredith Staggers, MD  mupirocin nasal ointment Drue Stager) 2 % Apply in each nostril daily for 10 days Patient not taking: Reported on 10/22/2016 10/10/16   Duffy Bruce, MD    I have reviewed the patient's current medications.  Labs:  Results for orders placed or performed during the hospital encounter of 12/07/16 (from the past 48 hour(s))  Glucose, capillary     Status: Abnormal   Collection Time: 12/11/16  3:52 PM  Result Value Ref Range   Glucose-Capillary 297 (H) 65 - 99 mg/dL   Comment 1 Document in Chart    Glucose, capillary     Status: Abnormal   Collection Time: 12/11/16  7:55 PM  Result Value Ref Range   Glucose-Capillary 366 (H) 65 - 99 mg/dL   Comment 1 Notify RN   Glucose, capillary     Status: Abnormal   Collection Time: 12/12/16 12:29 AM  Result Value Ref Range   Glucose-Capillary 156 (H) 65 - 99 mg/dL  Glucose, capillary     Status: Abnormal   Collection Time: 12/12/16  4:45 AM  Result Value Ref Range   Glucose-Capillary 219 (H) 65 - 99 mg/dL  Glucose, capillary     Status: Abnormal   Collection Time: 12/12/16  8:17 AM  Result Value Ref Range   Glucose-Capillary 206 (H) 65 - 99 mg/dL  Phosphorus     Status: Abnormal   Collection Time: 12/12/16  8:23 AM  Result Value Ref Range   Phosphorus 7.8 (H) 2.5 - 4.6 mg/dL  Magnesium     Status: None   Collection Time: 12/12/16  8:23 AM  Result Value Ref Range   Magnesium 2.1 1.7 - 2.4 mg/dL  Comprehensive metabolic panel     Status: Abnormal   Collection Time: 12/12/16  8:23 AM  Result Value Ref Range   Sodium 138 135 - 145 mmol/L   Potassium 2.8 (L) 3.5 - 5.1 mmol/L   Chloride 102 101 - 111 mmol/L   CO2 19 (L) 22 - 32 mmol/L   Glucose, Bld 194 (H) 65 - 99 mg/dL   BUN 108 (H) 6 - 20 mg/dL   Creatinine, Ser 4.11 (H) 0.44 - 1.00 mg/dL   Calcium 8.6 (L) 8.9 - 10.3 mg/dL   Total Protein 7.4 6.5 - 8.1 g/dL   Albumin 2.9 (L) 3.5 - 5.0 g/dL   AST 16 15 - 41 U/L   ALT 18 14 - 54 U/L   Alkaline Phosphatase 51 38 - 126 U/L   Total Bilirubin 0.7 0.3 - 1.2 mg/dL   GFR calc non Af Amer 11 (L) >60 mL/min   GFR calc Af Amer 12 (L) >60 mL/min    Comment: (NOTE) The eGFR has been calculated using the CKD EPI equation. This calculation has not been validated in all clinical situations. eGFR's persistently <60 mL/min signify possible Chronic Kidney Disease.    Anion gap 17 (H) 5 - 15  CBC     Status: Abnormal   Collection Time: 12/12/16  8:23 AM  Result Value Ref Range   WBC 14.0 (H) 4.0 - 10.5 K/uL   RBC 3.77 (L) 3.87 - 5.11  MIL/uL   Hemoglobin 9.2 (L) 12.0 - 15.0 g/dL   HCT 28.3 (L) 36.0 - 46.0 %   MCV 75.1 (L) 78.0 - 100.0 fL   MCH 24.4 (L) 26.0 - 34.0 pg   MCHC 32.5 30.0 - 36.0 g/dL   RDW 16.8 (H) 11.5 - 15.5 %   Platelets 633 (H) 150 - 400 K/uL  Glucose, capillary     Status: Abnormal   Collection Time: 12/12/16 11:50 AM  Result Value Ref Range   Glucose-Capillary 311 (H) 65 - 99 mg/dL  Glucose, capillary     Status: Abnormal   Collection Time: 12/12/16  4:23 PM  Result Value Ref Range   Glucose-Capillary 301 (H) 65 - 99 mg/dL  Glucose, capillary     Status: Abnormal   Collection Time: 12/12/16  8:03 PM  Result Value Ref Range   Glucose-Capillary 370 (H) 65 - 99 mg/dL   Comment 1 Notify RN    Comment 2 Document in Chart   Glucose, capillary     Status: Abnormal   Collection Time: 12/13/16 12:31 AM  Result Value Ref Range   Glucose-Capillary 242 (H) 65 - 99 mg/dL   Comment 1 Notify RN    Comment 2 Document in Chart   Glucose, capillary     Status: Abnormal   Collection Time: 12/13/16  4:05 AM  Result Value Ref Range   Glucose-Capillary 148 (H) 65 - 99 mg/dL   Comment 1 Notify RN    Comment 2 Document in Chart   Phosphorus     Status: Abnormal   Collection Time: 12/13/16  5:22 AM  Result Value Ref Range   Phosphorus 7.3 (H) 2.5 - 4.6 mg/dL  Magnesium     Status: None   Collection Time: 12/13/16  5:22 AM  Result Value Ref Range   Magnesium 2.1 1.7 - 2.4 mg/dL  Basic metabolic panel     Status: Abnormal   Collection Time: 12/13/16  5:22 AM  Result Value Ref Range   Sodium 137 135 - 145 mmol/L   Potassium 2.8 (L) 3.5 - 5.1 mmol/L   Chloride 102 101 - 111 mmol/L   CO2 20 (L) 22 - 32 mmol/L   Glucose, Bld 142 (H) 65 - 99 mg/dL   BUN 103 (H) 6 - 20 mg/dL   Creatinine, Ser 4.02 (H) 0.44 - 1.00 mg/dL   Calcium 8.7 (L) 8.9 - 10.3 mg/dL   GFR calc non Af Amer 11 (L) >60 mL/min   GFR calc Af Amer 13 (L) >60 mL/min    Comment: (NOTE) The eGFR has been calculated using the CKD EPI  equation. This calculation has not been validated in all clinical situations. eGFR's persistently <60 mL/min signify possible Chronic Kidney Disease.    Anion gap 15 5 - 15  CBC     Status: Abnormal   Collection Time: 12/13/16  5:22 AM  Result Value Ref Range   WBC 14.7 (H) 4.0 - 10.5 K/uL   RBC 3.56 (L) 3.87 - 5.11 MIL/uL   Hemoglobin 8.9 (L) 12.0 - 15.0 g/dL   HCT 26.4 (L) 36.0 - 46.0 %   MCV 74.2 (L) 78.0 - 100.0 fL   MCH 25.0 (L) 26.0 - 34.0 pg   MCHC 33.7 30.0 - 36.0 g/dL   RDW 17.1 (H) 11.5 - 15.5 %   Platelets 577 (H) 150 - 400 K/uL  Glucose, capillary     Status: Abnormal   Collection Time: 12/13/16  8:44 AM  Result Value Ref Range   Glucose-Capillary 153 (H) 65 - 99 mg/dL  Glucose, capillary     Status: Abnormal   Collection Time: 12/13/16 12:26 PM  Result Value Ref Range   Glucose-Capillary 210 (H) 65 - 99 mg/dL     ROS:  A comprehensive review of systems was negative except for: Respiratory: positive for cough, dyspnea on exertion, hemoptysis and wheezing All improved since admission  Physical Exam: Vitals:   12/13/16 0033 12/13/16 0647  BP: (!) 159/70 (!) 171/85  Pulse: 62 (!) 58  Resp: 18 18  Temp: 98 F (36.7 C) 98 F (36.7 C)     General: well appearing BF- alert, NAD- some SOB with exertion HEENT: PERRLA, EOMI, mucous membranes moist Neck: no JVD Heart: RRR, no murm Lungs: good BS, some exp wheeze Abdomen: obese, soft, non tender Extremities: no edema Skin: warm and dry Neuro: alert, non focal   Assessment/Plan:61 year old BF with clinical scenario consistent with ANCA positive vasculitis  1.Renal- renal impairment which is acute in nature- hematuria and serologies consistent with ANCA vasculitis.  No indications for dialysis so far but clearance not great.  I agree with treating aggressively - fortunately lungs have responded hope that kidneys may with time as well.  Kidney biopsy should give [place] some prognostic information.  I will take steroids  down to oral high dose prednisone- due for another rituxan dose on 4/7- also on prophylactic bactrim 2. Hypertension/volume  - BP high- possibly due to steroids.  Volume status actually seems ok.  On lasix and hydralazine- stopping IV solumedrol may help 3. Anemia  - hemoptysis and hosp along with anemia of CKD- check iron stores and give dose of aranesp  4. Hypokalemia- replete  Thank you for this consult, we will  follow with you   Ilina Xu A 12/13/2016, 1:48 PM

## 2016-12-13 NOTE — Progress Notes (Signed)
[facility] called staff and informed that the blood for Cryoglobulin was insufficient; MD informed.

## 2016-12-13 NOTE — Progress Notes (Signed)
[facility] Attending Note  Presenting HPI:  61 y.o. female with history of chronic sinusitis for 2 years as well as skin lesions. Patient now with features of alveolar hemorrhage on CT imaging and acute renal failure. BAVSC score of 23 on admission. Patient uses tobacco and marijuana but no cocaine abuse. Started on IV steroids and weekly Rituxan.  Subjective:  No cough or hemoptysis. No new dyspnea. No chest pain or pressure.  Review of Systems:  Denies fever, chills, or sweats. No abdominal pain, nausea, or diarrhea. No headache or vision changes.  Temp:  [98 F (36.7 C)-98.2 F (36.8 C)] 98 F (36.7 C) (04/05 0647) Pulse Rate:  [58-79] 58 (04/05 0647) Resp:  [16-20] 18 (04/05 0647) BP: (147-171)/(70-90) 171/85 (04/05 0647) SpO2:  [92 %-97 %] 97 % (04/05 0826)  General:  Awake. No distress. Alert. Integument:  Warm & dry. No rash on exposed skin. HEENT:  No scleral icterus or injection. Pupils symmetric. Moist mucus membranes. Pulmonary:  Clear bilaterally to auscultation. No accessory muscle use on room air. Good aeration bilaterally.  Cardiovascular:  Regular rate & rhythm. No JVD apprecaited. No edema. Abdomen:  Soft. Nontender. Normal bowel sounds. Neurological:  Cranial nerves grossly in tact. No meningismus. Moving all 4 extremities equally. Oriented x4.   CBC Latest Ref Rng & Units 12/13/2016 12/12/2016 12/09/2016  WBC 4.0 - 10.5 K/uL 14.7(H) 14.0(H) 11.6(H)  Hemoglobin 12.0 - 15.0 g/dL 8.9(L) 9.2(L) 7.7(L)  Hematocrit 36.0 - 46.0 % 26.4(L) 28.3(L) 23.5(L)  Platelets 150 - 400 K/uL 577(H) 633(H) 497(H)    BMP Latest Ref Rng & Units 12/13/2016 12/12/2016 12/11/2016  Glucose 65 - 99 mg/dL 142(H) 194(H) -  BUN 6 - 20 mg/dL 103(H) 108(H) -  Creatinine 0.44 - 1.00 mg/dL 4.02(H) 4.11(H) 4.52(H)  Sodium 135 - 145 mmol/L 137 138 -  Potassium 3.5 - 5.1 mmol/L 2.8(L) 2.8(L) -  Chloride 101 - 111 mmol/L 102 102 -  CO2 22 - 32 mmol/L 20(L) 19(L) -  Calcium 8.9 - 10.3  mg/dL 8.7(L) 8.6(L) -    IMAGING/STUDIES: CT CHEST W/O 3/31: IMPRESSION: 1. Dense right-sided airspace opacification is compatible with pneumonia, mildly worsened from the recent prior chest radiograph. Mild hazy opacity at the left lung apex is also concerning for pneumonia. 2. 1.3 cm right paratracheal node may reflect the underlying infection. 3. Mild cardiomegaly.  Mild coronary artery calcifications seen. RENAL U/S 3/31:  Echogenic kidneys consistent with medical renal disease. RIGHT kidney measures smaller than the LEFT which may be technical or, reflect atrophy. CT ABD/PELVIS W/O 3/31: IMPRESSION: 1. No definite acute abnormality in abdomen/pelvis. Moderate stool burden in the proximal colon can be seen with constipation. 2. Equivocal lower mesenteric edema without associated bowel abnormality. This is likely systemic etiology and can be seen with third-spacing. 3. Aortic atherosclerosis. CT HEAD W/O 3/31:  No acute intracranial abnormality.  MAXILLOFACIAL CT W/O 3/31:  No acute facial bone abnormality. Mild chronic change at the temporomandibular joints. Multiple absent teeth, chronic. PORT CXR 4/4:  Personally reviewed by me.   MICROBIOLOGY: Blood Cultures x2 3/30:  Negative MRSA PCR 3/31:  Negative Sputum Culture 3/31:  Normal Respiratory Flora  Respiratory Viral Panel PCR 3/31:  Negative  Influenza A/B PCR 3/31:  Negative  HIV 3/31:  Nonreactive  Urine Streptococcal Antigen 3/31:  Negative  Urine Legionella Antigen 3/31:  Negative   ANTIBIOTICS: Azithromycin 3/30 (x1 dose) Rocephin 3/30 (x1 dose) Vancomycin 3/31 (x1 dose) Zosyn 3/31 - 4/6 Bactrim DS  M/W/F 4/4 >>>  PATHOLOGY: Skin Biopsy 4/3 >>>  SEROLOGIES: ANA:  Positive CRP:  19.6 ESR:  >140 G-6PD:  Normal (12.7) CK:  749 ALDOLASE:  10.2 SCL-70:  <0.2 SSA:  <0.2 SSB:  <0.2 ANTI-CCP:  9 RF:  17.1 DS DNA AB:  6 C4:  18 C3:  128 Anti-GBM:  5 P-ANCA:  1:320 C-ANCA:  <1:20 Atypical P-ANCA:   <1:20  EVENTS: 03/31 - Rituxan dose #1 & started Solu-Medrol 229m IV q6hr  ASSESSMENT/PLAN:  61y.o. female with alveolar hemorrhage & vasculitis. Patient's biopsy result is pending. Serologies consistent with ANCA associated vasculitis. Seems to be clinically improving on Solu-Medrol & Rituxan.   1. Systemic vasculitis syndrome with alveolar hemorrhage: Skin biopsy pending. Continuing Weekly Rituxan & Solu-Medrol. Next dose of Rituxan is on 4/7. 2. Acute hypoxic respiratory failure:  Weaning FiO2. Patient needs ambulatory saturation prior to discharge. 3. Immunosuppression:  Continuing Bactrim DS on Monday, Wednesday, & Friday.  Remainder of care as per primary service.  I have spent a total of 36 minutes of time today caring for the patient, reviewing the patient's electronic medical record, and with more than 50% of that time spent coordinating care with the patient as well as reviewing the continuing plan of care with the patient at bedside.  JSonia BallerNAshok Cordia M.D. [facility] Pager:  [phone] 3pm or if no response, call [phone] 10:16 AM 12/13/16

## 2016-12-13 NOTE — Consult Note (Signed)
Chief Complaint: Patient was seen in consultation today for random renal biopsy Chief Complaint  Patient presents with  . Cough   at the request of Dr Georgena Spurling  Referring Physician(s): Dr Georgena Spurling  Supervising Physician: Arne Cleveland  Patient Status: [facility] - In-pt  History of Present Illness: Catherine Aguilar is a 61 y.o. female   Hx substance abuse Dyspnea and hemoptysis Pulmonary hemorrhage on CT Noted back skin lesions Acute renal failure; urinary RBCs  Systemic vasculitis syndrome; autoimmune P ANCA +; No G6PD deficiency  Request made for random renal biopsy Scheduled for 4/6 in Radiology Afeb wbc sl elevated secondary solumedrol  Past Medical History:  Diagnosis Date  . High cholesterol   . Hypertension    stopped taking medications 10 years ago  . Stroke [facility])     Past Surgical History:  Procedure Laterality Date  . ABDOMINAL HYSTERECTOMY  1985    Allergies: Patient has no known allergies.  Medications: Prior to Admission medications   Medication Sig Start Date End Date Taking? Authorizing Provider  aspirin 325 MG tablet Take 1 tablet (325 mg total) by mouth daily. 07/07/12  Yes Ivan Anchors Love, PA-C  fluticasone (FLONASE) 50 MCG/ACT nasal spray Place 1 spray into the nose daily as needed for congestion. 10/10/16  Yes Historical Provider, MD  gabapentin (NEURONTIN) 300 MG capsule Take 1 capsule (300 mg total) by mouth 2 (two) times daily. For numbness left side Patient taking differently: Take 600 mg by mouth 2 (two) times daily. 300 mg in the morning 900 mg in the evening 07/07/12  Yes Ivan Anchors Love, PA-C  hydrALAZINE (APRESOLINE) 50 MG tablet Take 50 mg by mouth 3 (three) times daily. 11/01/16  Yes Historical Provider, MD  indomethacin (INDOCIN) 25 MG capsule Take 1 capsule (25 mg total) by mouth 3 (three) times daily as needed. Patient taking differently: Take 25 mg by mouth 3 (three) times daily with meals.  09/11/15  Yes Antonietta Breach, PA-C  lisinopril  (PRINIVIL) 10 MG tablet Take 1 tablet (10 mg total) by mouth daily. 08/04/12  Yes Meredith Staggers, MD  loratadine (CLARITIN) 10 MG tablet TAKE ONE TABLET BY MOUTH EVERY DAY FOR NASAL CONGESTION 09/19/16  Yes Historical Provider, MD  losartan (COZAAR) 100 MG tablet Take 100 mg by mouth daily. 05/08/16  Yes Historical Provider, MD  metFORMIN (GLUCOPHAGE) 500 MG tablet TAKE 500 MG BY MOUTH AT BREAKFAST AND SUPPER 09/01/16  Yes Historical Provider, MD  pravastatin (PRAVACHOL) 40 MG tablet Take 1 tablet (40 mg total) by mouth daily. For elevated cholesterol 07/07/12  Yes Ivan Anchors Love, PA-C  PROVENTIL HFA 108 (90 Base) MCG/ACT inhaler INHALE 2 PUFFS EVERY 4-6 HOURS PRN FOR SOB AND WHEEZING 09/01/16  Yes Historical Provider, MD  sodium chloride (OCEAN) 0.65 % SOLN nasal spray Place 1 spray into both nostrils 3 (three) times daily.   Yes Historical Provider, MD  azithromycin (ZITHROMAX) 250 MG tablet Take 1 tablet (250 mg total) by mouth daily. Take 1 every day until finished. Received her first dose in the ED. Take your first dose on Monday, 10/23/2005 Patient not taking: Reported on 06/05/2016 10/24/15   Ottie Glazier, PA-C  fluticasone (FLONASE) 50 MCG/ACT nasal spray Place 2 sprays into both nostrils daily. Patient not taking: Reported on 10/22/2016 10/10/16 10/24/16  Duffy Bruce, MD  hydrALAZINE (APRESOLINE) 25 MG tablet Take 2 tablets (50 mg total) by mouth 3 (three) times daily. Patient not taking: Reported on 12/07/2016 08/04/12   Alroy Dust  Alen Blew, MD  mupirocin nasal ointment (BACTROBAN) 2 % Apply in each nostril daily for 10 days Patient not taking: Reported on 10/22/2016 10/10/16   Duffy Bruce, MD     Family History  Problem Relation Age of Onset  . Cancer Father   . Heart attack Mother     Social History   Social History  . Marital status: Married    Spouse name: N/A  . Number of children: N/A  . Years of education: N/A   Social History Main Topics  . Smoking status: Current  Every Day Smoker    Packs/day: 0.50    Years: 40.00    Types: Cigarettes  . Smokeless tobacco: Never Used  . Alcohol use Yes     Comment: on the weekends - moderately per pt  . Drug use: Yes    Types: Marijuana     Comment: occasional  . Sexual activity: Not Asked   Other Topics Concern  . None   Social History Narrative  . None    Review of Systems: A 12 point ROS discussed and pertinent positives are indicated in the HPI above.  All other systems are negative.  Review of Systems  Constitutional: Positive for activity change and fatigue. Negative for appetite change and fever.  HENT: Positive for sinus pain and sinus pressure.   Respiratory: Positive for cough and shortness of breath.   Cardiovascular: Negative for chest pain.  Gastrointestinal: Negative for abdominal pain.  Neurological: Positive for weakness.  Psychiatric/Behavioral: Negative for behavioral problems and confusion.    Vital Signs: BP (!) 171/85 (BP Location: Right Arm)   Pulse (!) 58   Temp 98 F (36.7 C) (Oral)   Resp 18   Ht 5\' 9"  (1.753 m)   Wt 190 lb 4.1 oz (86.3 kg)   SpO2 97%   BMI 28.10 kg/m   Physical Exam  Constitutional: She is oriented to person, place, and time.  Cardiovascular: Normal rate, regular rhythm and normal heart sounds.   Pulmonary/Chest: Effort normal and breath sounds normal.  Abdominal: Soft. Bowel sounds are normal. There is tenderness.  Musculoskeletal: Normal range of motion.  Neurological: She is alert and oriented to person, place, and time.  Skin: Skin is warm and dry.  Psychiatric: She has a normal mood and affect. Her behavior is normal. Judgment and thought content normal.  Nursing note and vitals reviewed.   Mallampati Score:  MD Evaluation Airway: WNL Heart: WNL Abdomen: WNL Chest/ Lungs: WNL ASA  Classification: 3 Mallampati/Airway Score: One  Imaging: Ct Abdomen Pelvis Wo Contrast  Result Date: 12/08/2016 CLINICAL DATA:  Abdominal distention.  EXAM: CT ABDOMEN AND PELVIS WITHOUT CONTRAST TECHNIQUE: Multidetector CT imaging of the abdomen and pelvis was performed following the standard protocol without IV contrast. COMPARISON:  Chest CT earlier this day. FINDINGS: Breathing motion artifact in the upper abdomen. Lower chest: Motion through the lower chest. Right-sided pulmonary opacity is partially included. The heart is enlarged. Hepatobiliary: No focal lesion allowing for lack contrast. Appears prominent size. Gallbladder physiologically distended, no calcified stone. No biliary dilatation. Pancreas: No ductal dilatation or gross peripancreatic inflammation, motion limited evaluation. Spleen: Normal in size without focal abnormality. Adrenals/Urinary Tract: No adrenal nodule. No hydronephrosis. Right kidney slightly ptopic. Detailed evaluation limited by motion. Probable cyst in the lower right kidney. Urinary bladder is physiologically distended. Stomach/Bowel: Motion artifact limits assessment. Stomach is decompressed. No small bowel dilatation to suggest obstruction. Moderate stool burden throughout the ascending and transverse colon. Tortuous sigmoid  colon that is partially obscured. Normal appendix. Questionable edema in the lower mesentery. No evidence of associated bowel inflammation. Vascular/Lymphatic: Abdominal aortic atherosclerosis. No aneurysm. Retroperitoneal phleboliths. Small mesenteric nodes not enlarged by size criteria. Reproductive: Status post hysterectomy. No adnexal masses. Other: No ascites or free air. Small fat containing umbilical hernia. Musculoskeletal: There are no acute or suspicious osseous abnormalities. Degenerative change throughout spine and both sacroiliac joints. IMPRESSION: 1. No definite acute abnormality in abdomen/pelvis. Moderate stool burden in the proximal colon can be seen with constipation. 2. Equivocal lower mesenteric edema without associated bowel abnormality. This is likely systemic etiology and can be  seen with third-spacing. 3. Aortic atherosclerosis. Electronically Signed   By: Jeb Levering M.D.   On: 12/08/2016 03:47   Dg Chest 2 View  Result Date: 12/07/2016 CLINICAL DATA:  Chronic shortness of breath and productive cough. Initial encounter. EXAM: CHEST  2 VIEW COMPARISON:  Chest radiograph performed 06/05/2016, and CT of the chest performed 07/02/2016 FINDINGS: The lungs are well-aerated. Dense right-sided airspace opacity raises concern for pneumonia. There is no evidence of pleural effusion or pneumothorax. The heart is mildly enlarged. No acute osseous abnormalities are seen. IMPRESSION: 1. Dense right-sided pneumonia. Followup PA and lateral chest X-ray is recommended in 3-4 weeks following trial of antibiotic therapy to ensure resolution and exclude underlying malignancy. 2. Mild cardiomegaly. Electronically Signed   By: Garald Balding M.D.   On: 12/07/2016 21:44   Ct Head Wo Contrast  Result Date: 12/09/2016 CLINICAL DATA:  Headache.  Vasculitis. EXAM: CT HEAD WITHOUT CONTRAST CT MAXILLOFACIAL WITHOUT CONTRAST TECHNIQUE: Multidetector CT imaging of the head and maxillofacial structures were performed using the standard protocol without intravenous contrast. Multiplanar CT image reconstructions of the maxillofacial structures were also generated. COMPARISON:  Head CT and brain MRI October 2013 FINDINGS: CT HEAD FINDINGS Brain: No evidence of acute infarction, hemorrhage, hydrocephalus, extra-axial collection or mass lesion/mass effect. Vascular: Atherosclerosis of skullbase vasculature without hyperdense vessel or abnormal calcification. Skull: Normal. Negative for fracture or focal lesion. Mastoid air cells are well-aerated. Other: None. CT MAXILLOFACIAL FINDINGS Osseous: No fracture or bony destructive change. There are multiple missing teeth. No periapical lucencies. Degenerative change of both temporomandibular joints, imaging obtained with open mouth. Orbits: Negative. No traumatic or  inflammatory finding. Sinuses: Clear.  No mucosal thickening or fluid levels. Soft tissues: Negative. IMPRESSION: 1.  No acute intracranial abnormality. 2. No acute facial bone abnormality. Mild chronic change at the temporomandibular joints. Multiple absent teeth, chronic. Electronically Signed   By: Jeb Levering M.D.   On: 12/09/2016 01:15   Ct Chest Wo Contrast  Result Date: 12/08/2016 CLINICAL DATA:  Acute onset of productive cough and generalized weakness. Rhinorrhea, congestion and shortness of breath. Chills. Initial encounter. EXAM: CT CHEST WITHOUT CONTRAST TECHNIQUE: Multidetector CT imaging of the chest was performed following the standard protocol without IV contrast. COMPARISON:  Chest radiograph performed 12/07/2016, and CT of the chest performed 07/02/2016 FINDINGS: Cardiovascular: The heart is mildly enlarged. Mild coronary artery calcification is seen. Mild calcification is seen along the aortic arch. The great vessels are grossly unremarkable in appearance. Mediastinum/Nodes: A mildly enlarged 1.3 cm right paratracheal node is seen. No definite additional mediastinal lymphadenopathy is seen. Trace pericardial fluid remains within normal limits. The thyroid gland is unremarkable. No axillary lymphadenopathy is seen. Lungs/Pleura: Dense right-sided airspace opacification is compatible with pneumonia, mildly worsened from the recent prior chest radiograph. Mild hazy opacity is also seen at the left lung apex, new from prior  CT and concerning for pneumonia. No pleural effusion or pneumothorax is seen. No dominant mass is identified. Previously noted upper lobe nodular opacities are obscured by the patient's pneumonia. Upper Abdomen: The visualized portions of the liver and the spleen are unremarkable in appearance. The visualized portions of the pancreas, adrenal glands and kidneys are within normal limits. Musculoskeletal: No acute osseous abnormalities are identified. The visualized  musculature is unremarkable in appearance. IMPRESSION: 1. Dense right-sided airspace opacification is compatible with pneumonia, mildly worsened from the recent prior chest radiograph. Mild hazy opacity at the left lung apex is also concerning for pneumonia. 2. 1.3 cm right paratracheal node may reflect the underlying infection. 3. Mild cardiomegaly.  Mild coronary artery calcifications seen. Electronically Signed   By: Garald Balding M.D.   On: 12/08/2016 00:58   US Renal  Result Date: 12/08/2016 CLINICAL DATA:  Acute kidney injury. History of hypertension and hypercholesterolemia. EXAM: RENAL / URINARY TRACT ULTRASOUND COMPLETE COMPARISON:  None. FINDINGS: Right Kidney: Length: 10.4 cm. Echogenic parenchyma. No mass or hydronephrosis visualized. Left Kidney: Length: 14.4 cm. Echogenic parenchyma. No mass or hydronephrosis visualized. Bladder: Appears normal for degree of bladder distention. IMPRESSION: Echogenic kidneys consistent with medical renal disease. RIGHT kidney measures smaller than the LEFT which may be technical or, reflect atrophy. Electronically Signed   By: Elon Alas M.D.   On: 12/08/2016 01:39   Dg Chest Port 1 View  Result Date: 12/12/2016 CLINICAL DATA:  Respiratory failure. EXAM: PORTABLE CHEST 1 VIEW COMPARISON:  12/09/2016.  CT 12/08/2016. FINDINGS: Persistent diffuse right lung infiltrate particularly prominent in the right upper lobe. Findings have improved slightly from prior exam. Mild left mid lung field subsegmental atelectasis. No pleural effusion or pneumothorax. Stable cardiomegaly. No pulmonary venous congestion . IMPRESSION: Persistent diffuse right lung infiltrate, particularly prominent in the right upper lobe. Findings have improved slightly from prior exam. Electronically Signed   By: [place]   On: 12/12/2016 07:35   Dg Chest Port 1 View  Result Date: 12/09/2016 CLINICAL DATA:  Followup for diffuse pulmonary hemorrhage. EXAM: PORTABLE CHEST 1 VIEW  COMPARISON:  Chest CT, 12/08/2016.  Chest radiograph, 12/07/2016. FINDINGS: There is extensive consolidation in the right upper lobe was less dense consolidation in the right lower lobe. A smaller area of consolidation is noted in the left upper lobe. Findings are stable from the previous day's CT scan. Right lower lobe opacity has mildly increased when compared the prior chest radiograph. No convincing pleural effusion.  No pneumothorax. Heart is mildly enlarged. IMPRESSION: 1. Extensive areas of lung consolidation, mostly on the right, stable from the previous day's chest CT scan. Electronically Signed   By: Lajean Manes M.D.   On: 12/09/2016 11:03   Ct Maxillofacial Wo Contrast  Result Date: 12/09/2016 CLINICAL DATA:  Headache.  Vasculitis. EXAM: CT HEAD WITHOUT CONTRAST CT MAXILLOFACIAL WITHOUT CONTRAST TECHNIQUE: Multidetector CT imaging of the head and maxillofacial structures were performed using the standard protocol without intravenous contrast. Multiplanar CT image reconstructions of the maxillofacial structures were also generated. COMPARISON:  Head CT and brain MRI October 2013 FINDINGS: CT HEAD FINDINGS Brain: No evidence of acute infarction, hemorrhage, hydrocephalus, extra-axial collection or mass lesion/mass effect. Vascular: Atherosclerosis of skullbase vasculature without hyperdense vessel or abnormal calcification. Skull: Normal. Negative for fracture or focal lesion. Mastoid air cells are well-aerated. Other: None. CT MAXILLOFACIAL FINDINGS Osseous: No fracture or bony destructive change. There are multiple missing teeth. No periapical lucencies. Degenerative change of both temporomandibular joints, imaging obtained  with open mouth. Orbits: Negative. No traumatic or inflammatory finding. Sinuses: Clear.  No mucosal thickening or fluid levels. Soft tissues: Negative. IMPRESSION: 1.  No acute intracranial abnormality. 2. No acute facial bone abnormality. Mild chronic change at the  temporomandibular joints. Multiple absent teeth, chronic. Electronically Signed   By: Jeb Levering M.D.   On: 12/09/2016 01:15    Labs:  CBC:  Recent Labs  12/09/16 0839 12/09/16 1809 12/12/16 0823 12/13/16 0522  WBC 10.7* 11.6* 14.0* 14.7*  HGB 7.8* 7.7* 9.2* 8.9*  HCT 23.9* 23.5* 28.3* 26.4*  PLT 476* 497* 633* 577*    COAGS:  Recent Labs  12/08/16 0001 12/10/16 0248  INR 1.21 1.35  APTT 44*  --     BMP:  Recent Labs  12/08/16 0911  12/10/16 0248 12/11/16 0148 12/12/16 0823 12/13/16 0522  NA 136  --  138  --  138 137  K 4.7  --  3.8  --  2.8* 2.8*  CL 111  --  107  --  102 102  CO2 13*  --  15*  --  19* 20*  GLUCOSE 187*  --  188*  --  194* 142*  BUN 65*  --  85*  --  108* 103*  CALCIUM 8.4*  --  8.9  --  8.6* 8.7*  CREATININE 4.26*  < > 4.49* 4.52* 4.11* 4.02*  GFRNONAA 10*  < > 10* 10* 11* 11*  GFRAA 12*  < > 11* 11* 12* 13*  < > = values in this interval not displayed.  LIVER FUNCTION TESTS:  Recent Labs  12/08/16 0001 12/10/16 0248 12/12/16 0823  BILITOT 0.5 0.7 0.7  AST 36 15 16  ALT 30 23 18   ALKPHOS 79 64 51  PROT 7.6 7.3 7.4  ALBUMIN 2.9* 2.8* 2.9*    TUMOR MARKERS: No results for input(s): AFPTM, CEA, CA199, CHROMGRNA in the last 8760 hours.  Assessment and Plan:  Autoimmune vasculitis Acute renal failure Scheduled for random renal biopsy in Rad 4/6 Risks and Benefits discussed with the patient including, but not limited to bleeding, infection, damage to adjacent structures or low yield requiring additional tests. All of the patient's questions were answered, patient is agreeable to proceed. Consent signed and in chart.   Thank you for this interesting consult.  I greatly enjoyed meeting Catherine Aguilar and look forward to participating in their care.  A copy of this report was sent to the requesting provider on this date.  Electronically Signed: Benson Porcaro A 12/13/2016, 11:15 AM   I spent a total of 40 Minutes    in  face to face in clinical consultation, greater than 50% of which was counseling/coordinating care for random renal bx

## 2016-12-13 NOTE — Progress Notes (Signed)
PROGRESS NOTE    Versa Craton  YIR:[id number] DOB: 1956/08/06 DOA: 12/07/2016 PCP: Maggie Font, MD   Brief Narrative: Catherine Aguilar is a 61 y.o. female with history of substance abuse. She presented with dyspnea and hemoptysis with concerns for autoimmune vasculitis.   Assessment & Plan:   Principal Problem:   Systemic vasculitis syndrome (HCC) Active Problems:   CVA (cerebral vascular accident) ([place])   Essential hypertension   HLD (hyperlipidemia)   Tobacco abuse   AKI (acute kidney injury) ([place])   Sepsis ([place])   Hemoptysis   CAP (community acquired pneumonia)   Symptomatic anemia   Acute respiratory failure with hypoxia (HCC)   Wegener's granulomatosis with renal involvement ([place])   Intra-alveolar hemorrhage   Acute renal failure (ARF) (HCC)   Cutaneous vasculitis   Substance abuse   Goals of care, counseling/discussion   Diffuse pulmonary alveolar hemorrhage   Possible systemic vasculitis syndrome Involvement of lungs, sinuses and kidneys. CRP, RF, myeloperoxidase antibodies elevated -Continue IV steroids and tiruximab -skin biopsy pending -IR consulted for renal biopsy -continue Bactrim (Mon,Wed,Fri)  Acute hypoxic respiratory failure secondary to alveolar hemorrhage Improved. S/p 2 units of PRBC -Continue to wean oxygen  History of substance abuse Counseled  Essential hypertension -continued hydralazine  History of CVA -continue statin -continue to hold aspirin secondary to bleeding  Alcohol abuse -CIWA at admission  Acute kidney injury Stable.   DVT prophylaxis: SCDs Code Status: Full code Family Communication: Daughter at bedside Disposition Plan: Discharge home pending continued workup   Consultants:   PCCM/Pulmonology  General surgery  Procedures:   Skin biopsy  Antimicrobials:  Zosyn    Subjective: Some coughing with mild streaky blood. No chest pain or dyspnea.  Objective: Vitals:   12/12/16 1937 12/13/16 0033  12/13/16 0647 12/13/16 0826  BP:  (!) 159/70 (!) 171/85   Pulse: 79 62 (!) 58   Resp: 16 18 18    Temp:  98 F (36.7 C) 98 F (36.7 C)   TempSrc:  Oral Oral   SpO2: 96% 92% 95% 97%  Weight:      Height:        Intake/Output Summary (Last 24 hours) at 12/13/16 1247 Last data filed at 12/13/16 0957  Gross per 24 hour  Intake              320 ml  Output              900 ml  Net             -580 ml   Filed Weights   12/08/16 0420 12/11/16 2102 12/12/16 0653  Weight: 95.4 kg (210 lb 5.1 oz) 86.5 kg (190 lb 11.2 oz) 86.3 kg (190 lb 4.1 oz)    Examination:  General exam: Appears calm and comfortable. Respiratory system: Clear to auscultation. Respiratory effort normal. Cardiovascular system: S1 & S2 heard, RRR. No murmurs Gastrointestinal system: Abdomen is nondistended, soft and nontender. Normal bowel sounds heard. Central nervous system: Alert and oriented. No focal neurological deficits. Extremities: No edema. No calf tenderness Skin: No cyanosis.                                   Psychiatry: Judgement and insight appear normal. Mood & affect appropriate.     Data Reviewed: I have personally reviewed following labs and imaging studies  CBC:  Recent Labs Lab 12/07/16 2313  12/08/16 0909  12/09/16  6063 12/09/16 0160 12/09/16 1809 12/12/16 0823 12/13/16 0522  WBC 10.8*  < > 10.6*  < > 9.1 10.7* 11.6* 14.0* 14.7*  NEUTROABS 8.0*  --  8.6*  --   --   --   --   --   --   HGB 6.1*  < > 5.3*  < > 6.2* 7.8* 7.7* 9.2* 8.9*  HCT 19.0*  < > 16.9*  < > 18.8* 23.9* 23.5* 28.3* 26.4*  MCV 75.4*  < > 74.8*  < > 74.0* 76.1* 75.1* 75.1* 74.2*  PLT 512*  < > 456*  < > 421* 476* 497* 633* 577*  < > = values in this interval not displayed. Basic Metabolic Panel:  Recent Labs Lab 12/07/16 2313 12/08/16 0911 12/09/16 1093 12/10/16 0248 12/11/16 0148 12/12/16 0823 12/13/16 0522  NA 135 136  --  138  --  138 137  K 4.3 4.7  --  3.8  --  2.8* 2.8*  CL 110 111  --  107  --   102 102  CO2 14* 13*  --  15*  --  19* 20*  GLUCOSE 189* 187*  --  188*  --  194* 142*  BUN 77* 65*  --  85*  --  108* 103*  CREATININE 4.41* 4.26* 4.28* 4.49* 4.52* 4.11* 4.02*  CALCIUM 8.9 8.4*  --  8.9  --  8.6* 8.7*  MG  --   --   --  2.0 2.0 2.1 2.1  PHOS  --   --   --  6.5* 6.2* 7.8* 7.3*   GFR: Estimated Creatinine Clearance: 17.2 mL/min (A) (by C-G formula based on SCr of 4.02 mg/dL (H)). Liver Function Tests:  Recent Labs Lab 12/08/16 0001 12/10/16 0248 12/12/16 0823  AST 36 15 16  ALT 30 23 18   ALKPHOS 79 64 51  BILITOT 0.5 0.7 0.7  PROT 7.6 7.3 7.4  ALBUMIN 2.9* 2.8* 2.9*   No results for input(s): LIPASE, AMYLASE in the last 168 hours. No results for input(s): AMMONIA in the last 168 hours. Coagulation Profile:  Recent Labs Lab 12/08/16 0001 12/10/16 0248  INR 1.21 1.35   Cardiac Enzymes:  Recent Labs Lab 12/08/16 0836 12/08/16 1520 12/08/16 1851 12/09/16 0212  CKTOTAL 749*  --   --   --   CKMB 12.3*  --   --   --   TROPONINI  --  <0.03 <0.03 <0.03   BNP (last 3 results) No results for input(s): PROBNP in the last 8760 hours. HbA1C: No results for input(s): HGBA1C in the last 72 hours. CBG:  Recent Labs Lab 12/12/16 2003 12/13/16 0031 12/13/16 0405 12/13/16 0844 12/13/16 1226  GLUCAP 370* 242* 148* 153* 210*   Lipid Profile: No results for input(s): CHOL, HDL, LDLCALC, TRIG, CHOLHDL, LDLDIRECT in the last 72 hours. Thyroid Function Tests: No results for input(s): TSH, T4TOTAL, FREET4, T3FREE, THYROIDAB in the last 72 hours. Anemia Panel: No results for input(s): VITAMINB12, FOLATE, FERRITIN, TIBC, IRON, RETICCTPCT in the last 72 hours. Sepsis Labs:  Recent Labs Lab 12/08/16 0122 12/08/16 0143 12/08/16 0836 12/09/16 0212 12/10/16 0248  PROCALCITON 2.66  --  3.33 3.90 2.80  LATICACIDVEN  --  1.1  --   --   --     Recent Results (from the past 240 hour(s))  Culture, blood (routine x 2) Call MD if unable to obtain prior to  antibiotics being given     Status: None (Preliminary result)   Collection Time:  12/07/16 11:10 PM  Result Value Ref Range Status   Specimen Description BLOOD BLOOD RIGHT FOREARM  Final   Special Requests BOTTLES DRAWN AEROBIC AND ANAEROBIC 5ML  Final   Culture   Final    NO GROWTH 4 DAYS Performed at [facility], [place] [address], [place], [place] [address]    Report Status PENDING  Incomplete  Culture, blood (routine x 2) Call MD if unable to obtain prior to antibiotics being given     Status: None (Preliminary result)   Collection Time: 12/07/16 11:12 PM  Result Value Ref Range Status   Specimen Description BLOOD LEFT HAND  Final   Special Requests BOTTLES DRAWN AEROBIC ONLY 6ML  Final   Culture   Final    NO GROWTH 4 DAYS Performed at [facility], [address], [place], [place] [address]    Report Status PENDING  Incomplete  Culture, sputum-assessment     Status: None   Collection Time: 12/08/16  1:08 AM  Result Value Ref Range Status   Specimen Description EXPECTORATED SPUTUM  Final   Special Requests NONE  Final   Sputum evaluation   Final    THIS SPECIMEN IS ACCEPTABLE FOR SPUTUM CULTURE Performed at [facility], [address], [place], [place] [address]    Report Status 12/08/2016 FINAL  Final  Culture, respiratory (NON-Expectorated)     Status: None   Collection Time: 12/08/16  1:08 AM  Result Value Ref Range Status   Specimen Description EXPECTORATED SPUTUM  Final   Special Requests NONE Reflexed from [id number]  Final   Gram Stain   Final    FEW SQUAMOUS EPITHELIAL CELLS PRESENT FEW WBC PRESENT, PREDOMINANTLY MONONUCLEAR FEW GRAM POSITIVE RODS FEW GRAM POSITIVE COCCI IN PAIRS FEW GRAM NEGATIVE RODS    Culture   Final    Consistent with normal respiratory flora. Performed at [facility], [place] [address] [place], [place] [address]    Report Status 12/10/2016 FINAL  Final  MRSA PCR Screening     Status: None   Collection Time: 12/08/16   4:22 AM  Result Value Ref Range Status   MRSA by PCR NEGATIVE NEGATIVE Final    Comment:        The GeneXpert MRSA Assay (FDA approved for NASAL specimens only), is one component of a comprehensive MRSA colonization surveillance program. It is not intended to diagnose MRSA infection nor to guide or monitor treatment for MRSA infections.   Respiratory Panel by PCR     Status: None   Collection Time: 12/08/16  8:30 AM  Result Value Ref Range Status   Adenovirus NOT DETECTED NOT DETECTED Final   Coronavirus 229E NOT DETECTED NOT DETECTED Final   Coronavirus HKU1 NOT DETECTED NOT DETECTED Final   Coronavirus NL63 NOT DETECTED NOT DETECTED Final   Coronavirus OC43 NOT DETECTED NOT DETECTED Final   Metapneumovirus NOT DETECTED NOT DETECTED Final   Rhinovirus / Enterovirus NOT DETECTED NOT DETECTED Final   Influenza A NOT DETECTED NOT DETECTED Final   Influenza B NOT DETECTED NOT DETECTED Final   Parainfluenza Virus 1 NOT DETECTED NOT DETECTED Final   Parainfluenza Virus 2 NOT DETECTED NOT DETECTED Final   Parainfluenza Virus 3 NOT DETECTED NOT DETECTED Final   Parainfluenza Virus 4 NOT DETECTED NOT DETECTED Final   Respiratory Syncytial Virus NOT DETECTED NOT DETECTED Final   Bordetella pertussis NOT DETECTED NOT DETECTED Final   Chlamydophila pneumoniae NOT DETECTED NOT  DETECTED Final   Mycoplasma pneumoniae NOT DETECTED NOT DETECTED Final         Radiology Studies: Dg Chest Port 1 View  Result Date: 12/12/2016 CLINICAL DATA:  Respiratory failure. EXAM: PORTABLE CHEST 1 VIEW COMPARISON:  12/09/2016.  CT 12/08/2016. FINDINGS: Persistent diffuse right lung infiltrate particularly prominent in the right upper lobe. Findings have improved slightly from prior exam. Mild left mid lung field subsegmental atelectasis. No pleural effusion or pneumothorax. Stable cardiomegaly. No pulmonary venous congestion . IMPRESSION: Persistent diffuse right lung infiltrate, particularly prominent in  the right upper lobe. Findings have improved slightly from prior exam. Electronically Signed   By: [place]   On: 12/12/2016 07:35        Scheduled Meds: . dextromethorphan-guaiFENesin  1 tablet Oral BID  . folic acid  1 mg Oral Daily  . furosemide  40 mg Intravenous Q12H  . hydrALAZINE  75 mg Oral TID  . insulin aspart  0-20 Units Subcutaneous Q4H  . ipratropium  0.5 mg Nebulization TID  . levalbuterol  1.25 mg Nebulization TID  . lidocaine-EPINEPHrine  20 mL Other Once  . methylPREDNISolone (SOLU-MEDROL) injection  250 mg Intravenous Q6H  . multivitamin with minerals  1 tablet Oral Daily  . nicotine  21 mg Transdermal Daily  . pantoprazole  40 mg Oral Daily  . piperacillin-tazobactam (ZOSYN)  IV  2.25 g Intravenous Q8H  . polyethylene glycol  17 g Oral Daily  . sulfamethoxazole-trimethoprim  1 tablet Oral Once per day on Mon Wed Fri  . thiamine  100 mg Oral Daily   Continuous Infusions: . sodium chloride 10 mL/hr at 12/11/16 2000     LOS: 5 days     Cordelia Poche, MD [facility] 12/13/2016, 12:47 PM Pager: [phone]  If 7PM-7AM, please contact night-coverage [email or web address] Password [facility] 12/13/2016, 12:47 PM

## 2016-12-13 NOTE — Progress Notes (Signed)
qPhysical Therapy Treatment Patient Details Name: Catherine Aguilar MRN: [id number] DOB: 10-Jun-1956 Today's Date: 12/13/2016    History of Present Illness Pt is a 61 y/o female admitted secondary to hemoptysis and generalized weakness. PMH includes smoker, marajuana use, HTN, chronic bronchitis, and CVA.     PT Comments    Pt performed increased mobility and progressed gait training.  Pt remains on chemotherapy precautions at this time.  Will continue to performed PT during stay plan next session for stair training.    Follow Up Recommendations  Home health PT;Supervision/Assistance - 24 hour     Equipment Recommendations  Rolling walker with 5" wheels;3in1 (PT)    Recommendations for Other Services OT consult     Precautions / Restrictions Precautions Precautions: Other (comment) (chemotherapy precautions) Restrictions Weight Bearing Restrictions: No    Mobility  Bed Mobility Overal bed mobility: Modified Independent             General bed mobility comments: Requiring increased time. Pt with SOB noted after bed mobility. Required seated rest break for ~2-3 min.   Transfers Overall transfer level: Needs assistance Equipment used: None Transfers: Sit to/from Stand Sit to Stand: Supervision         General transfer comment: Supervision for safety.   Ambulation/Gait Ambulation/Gait assistance: Min guard Ambulation Distance (Feet): 70 Feet Assistive device: None (reaching intermittently for railings and required cues to perform reciprocal armswing to improve balance. ) Gait Pattern/deviations: Step-through pattern;Decreased stride length;Trunk flexed Gait velocity: Decreased Gait velocity interpretation: Below normal speed for age/gender General Gait Details: Pt less fatigued. DOE 2/4, balance impaired but improves as gait progresses.     Stairs            Wheelchair Mobility    Modified Rankin (Stroke Patients Only)       Balance Overall balance  assessment: Needs assistance   Sitting balance-Leahy Scale: Good       Standing balance-Leahy Scale: Fair                              Cognition Arousal/Alertness: Awake/alert Behavior During Therapy: WFL for tasks assessed/performed Overall Cognitive Status: Within Functional Limits for tasks assessed                                        Exercises      General Comments        Pertinent Vitals/Pain Pain Assessment: No/denies pain    Home Living                      Prior Function            PT Goals (current goals can now be found in the care plan section) Acute Rehab PT Goals Patient Stated Goal: to go home and be independent Potential to Achieve Goals: Good Progress towards PT goals: Progressing toward goals    Frequency    Min 3X/week      PT Plan Current plan remains appropriate    Co-evaluation             End of Session Equipment Utilized During Treatment: Gait belt Activity Tolerance: Patient limited by fatigue Patient left: in bed;with call bell/phone within reach Nurse Communication: Mobility status PT Visit Diagnosis: Other abnormalities of gait and mobility (R26.89);Muscle weakness (generalized) (M62.81)     Time:  2527-1292 PT Time Calculation (min) (ACUTE ONLY): 19 min  Charges:  $Gait Training: 8-22 mins                    G Codes:       Governor Rooks, PTA pager [phone]    Cristela Blue 12/13/2016, 4:38 PM

## 2016-12-13 NOTE — Progress Notes (Signed)
Inpatient Diabetes Program Recommendations  AACE/ADA: New Consensus Statement on Inpatient Glycemic Control (2015)  Target Ranges:  Prepandial:   less than 140 mg/dL      Peak postprandial:   less than 180 mg/dL (1-2 hours)      Critically ill patients:  140 - 180 mg/dL   Results for Catherine Aguilar, Catherine Aguilar (MRN [id number]) as of 12/13/2016 10:52  Ref. Range 12/12/2016 08:17 12/12/2016 11:50 12/12/2016 16:23 12/12/2016 20:03 12/13/2016 00:31 12/13/2016 04:05 12/13/2016 08:44  Glucose-Capillary Latest Ref Range: 65 - 99 mg/dL 206 (H) 311 (H) 301 (H) 370 (H) 242 (H) 148 (H) 153 (H)   Review of Glycemic Control  Diabetes history: DM2 Outpatient Diabetes medications: Metformin 500 qd Current orders for Inpatient glycemic control: Novolog correction 0-20 units q 4 hrs.  Inpatient Diabetes Program Recommendations:     -Novolog 4 units tid meal coverage if eats 50% (while on steroids) -Carb modified diet  Thanks,  Tama Headings RN, MSN, [facility] Inpatient Diabetes Coordinator Team Pager [phone] (8a-5p)

## 2016-12-14 ENCOUNTER — Inpatient Hospital Stay (HOSPITAL_COMMUNITY): Payer: Medicaid Other

## 2016-12-14 LAB — GLUCOSE, CAPILLARY
GLUCOSE-CAPILLARY: 96 mg/dL (ref 65–99)
GLUCOSE-CAPILLARY: 294 mg/dL — AB (ref 65–99)
Glucose-Capillary: 103 mg/dL — ABNORMAL HIGH (ref 65–99)
Glucose-Capillary: 108 mg/dL — ABNORMAL HIGH (ref 65–99)
Glucose-Capillary: 117 mg/dL — ABNORMAL HIGH (ref 65–99)
Glucose-Capillary: 205 mg/dL — ABNORMAL HIGH (ref 65–99)
Glucose-Capillary: 242 mg/dL — ABNORMAL HIGH (ref 65–99)
Glucose-Capillary: 307 mg/dL — ABNORMAL HIGH (ref 65–99)

## 2016-12-14 LAB — BASIC METABOLIC PANEL
Anion gap: 16 — ABNORMAL HIGH (ref 5–15)
BUN: 106 mg/dL — AB (ref 6–20)
CO2: 20 mmol/L — ABNORMAL LOW (ref 22–32)
CREATININE: 4.39 mg/dL — AB (ref 0.44–1.00)
Calcium: 9.1 mg/dL (ref 8.9–10.3)
Chloride: 102 mmol/L (ref 101–111)
GFR calc Af Amer: 12 mL/min — ABNORMAL LOW (ref >60)
GFR, EST NON AFRICAN AMERICAN: 10 mL/min — AB (ref >60)
Glucose, Bld: 86 mg/dL (ref 65–99)
Potassium: 2.9 mmol/L — ABNORMAL LOW (ref 3.5–5.1)
SODIUM: 138 mmol/L (ref 135–145)

## 2016-12-14 LAB — CBC
HEMATOCRIT: 28.7 % — AB (ref 36.0–46.0)
HEMOGLOBIN: 9.7 g/dL — AB (ref 12.0–15.0)
MCH: 25.3 pg — ABNORMAL LOW (ref 26.0–34.0)
MCHC: 33.8 g/dL (ref 30.0–36.0)
MCV: 74.7 fL — AB (ref 78.0–100.0)
Platelets: 658 10*3/uL — ABNORMAL HIGH (ref 150–400)
RBC: 3.84 MIL/uL — AB (ref 3.87–5.11)
RDW: 17.7 % — ABNORMAL HIGH (ref 11.5–15.5)
WBC: 20.5 10*3/uL — ABNORMAL HIGH (ref 4.0–10.5)

## 2016-12-14 LAB — IRON AND TIBC
Iron: 51 ug/dL (ref 28–170)
Saturation Ratios: 21 % (ref 10.4–31.8)
TIBC: 244 ug/dL — ABNORMAL LOW (ref 250–450)
UIBC: 193 ug/dL

## 2016-12-14 LAB — PROTIME-INR
INR: 1.14
PROTHROMBIN TIME: 14.7 s (ref 11.4–15.2)

## 2016-12-14 MED ORDER — ATROPINE SULFATE 1 MG/10ML IJ SOSY
PREFILLED_SYRINGE | INTRAMUSCULAR | Status: AC
  Filled 2016-12-14: qty 10

## 2016-12-14 MED ORDER — HYDROCODONE-ACETAMINOPHEN 5-325 MG PO TABS
1.0000 | ORAL_TABLET | Freq: Four times a day (QID) | ORAL | Status: DC | PRN
  Filled 2016-12-14: qty 1

## 2016-12-14 MED ORDER — FENTANYL CITRATE (PF) 100 MCG/2ML IJ SOLN
INTRAMUSCULAR | Status: AC
  Filled 2016-12-14: qty 2

## 2016-12-14 MED ORDER — MIDAZOLAM HCL 2 MG/2ML IJ SOLN
INTRAMUSCULAR | Status: AC
  Filled 2016-12-14: qty 2

## 2016-12-14 MED ORDER — RITUXIMAB CHEMO INJECTION 500 MG/50ML
375.0000 mg/m2 | Freq: Once | INTRAVENOUS | Status: AC
  Administered 2016-12-14: 800 mg via INTRAVENOUS
  Filled 2016-12-14 (×2): qty 80

## 2016-12-14 MED ORDER — FUROSEMIDE 80 MG PO TABS
80.0000 mg | ORAL_TABLET | Freq: Every day | ORAL | Status: DC
  Administered 2016-12-14 – 2016-12-16 (×3): 80 mg via ORAL
  Filled 2016-12-14 (×3): qty 1

## 2016-12-14 MED ORDER — LIDOCAINE HCL 1 % IJ SOLN
INTRAMUSCULAR | Status: AC
  Filled 2016-12-14: qty 20

## 2016-12-14 MED ORDER — MORPHINE SULFATE (PF) 2 MG/ML IV SOLN
2.0000 mg | Freq: Once | INTRAVENOUS | Status: AC
  Administered 2016-12-14: 2 mg via INTRAVENOUS
  Filled 2016-12-14: qty 1

## 2016-12-14 MED ORDER — MIDAZOLAM HCL 2 MG/2ML IJ SOLN
INTRAMUSCULAR | Status: AC | PRN
  Administered 2016-12-14: 1 mg via INTRAVENOUS

## 2016-12-14 MED ORDER — GELATIN ABSORBABLE 12-7 MM EX MISC
CUTANEOUS | Status: AC
  Filled 2016-12-14: qty 1

## 2016-12-14 MED ORDER — FENTANYL CITRATE (PF) 100 MCG/2ML IJ SOLN
INTRAMUSCULAR | Status: AC | PRN
  Administered 2016-12-14: 25 ug via INTRAVENOUS

## 2016-12-14 NOTE — Significant Event (Signed)
Rapid Response Event Note  Overview:  RRT page to [place] Time Called: 0919 Arrival Time: 0925 Event Type: Hypotension  Initial Focused Assessment:  RRT page to [place].  On my arrival to patients bedside, [place] tech, RN and MD at bedside.  Patient undergoing renal biopsy procedure and suddenly became unresponsive and hypotensive.  Patient in trendelenburg and receiving NS bolus prior to my arrival.  PAtient alert and oriented   Interventions: No RRT interventions.  Patient requesting something to drink, icec hips given to patient.  Patient states she is feeling better now.    Plan of Care (if not transferred):  Patient to return to her room on 5 w when appropriate.  Radiology RN at bedside, to call if assistance needed  Event Summary:   at      at          [facility]

## 2016-12-14 NOTE — Sedation Documentation (Signed)
Patient is resting comfortably. 

## 2016-12-14 NOTE — Progress Notes (Signed)
PROGRESS NOTE    Catherine Aguilar  XMI:[id number] DOB: 10-07-1955 DOA: 12/07/2016 PCP: Maggie Font, MD   Brief Narrative: Catherine Aguilar is a 61 y.o. female with history of substance abuse. She presented with dyspnea and hemoptysis with concerns for autoimmune vasculitis.   Assessment & Plan:   Principal Problem:   Systemic vasculitis syndrome (HCC) Active Problems:   CVA (cerebral vascular accident) ([place])   Essential hypertension   HLD (hyperlipidemia)   Tobacco abuse   AKI (acute kidney injury) ([place])   Sepsis ([place])   Hemoptysis   CAP (community acquired pneumonia)   Symptomatic anemia   Acute respiratory failure with hypoxia (HCC)   Wegener's granulomatosis with renal involvement ([place])   Intra-alveolar hemorrhage   Acute renal failure (ARF) (HCC)   Cutaneous vasculitis   Substance abuse   Goals of care, counseling/discussion   Diffuse pulmonary alveolar hemorrhage   Systemic vasculitis syndrome Involvement of lungs, sinuses and kidneys. Skin biopsy without evidence of vasculitis. -pulmonology recommendations: rituximab, steroids, bactrim -renal biopsy today -nephrology recommendations: oral steroids, aranesp  Acute hypoxic respiratory failure secondary to alveolar hemorrhage Improved. S/p 2 units of PRBC. On room air. No hemoptysis  History of substance abuse Counseled  Essential hypertension -continued hydralazine  History of CVA -continue statin -continue to hold aspirin secondary to bleeding  Alcohol abuse -CIWA at admission  Acute kidney injury Stable.  Hypokalemia -replete   DVT prophylaxis: SCDs Code Status: Full code Family Communication: Daughter at bedside Disposition Plan: Discharge home pending continued workup   Consultants:   PCCM/Pulmonology  General surgery  Nephrology  Procedures:   Skin biopsy  Renal biopsy  Antimicrobials:  Zosyn  Bactrim   Subjective: No hemoptysis. No dyspnea. No issues with  urination.  Objective: Vitals:   12/14/16 0913 12/14/16 0930 12/14/16 0937 12/14/16 0944  BP: 114/80 109/69 114/70 100/70  Pulse: 77 78 63 78  Resp: 18 18 16    Temp:      TempSrc:      SpO2: 96% 95% 97% 97%  Weight:      Height:        Intake/Output Summary (Last 24 hours) at 12/14/16 1151 Last data filed at 12/14/16 1137  Gross per 24 hour  Intake           286.17 ml  Output                0 ml  Net           286.17 ml   Filed Weights   12/08/16 0420 12/11/16 2102 12/12/16 0653  Weight: 95.4 kg (210 lb 5.1 oz) 86.5 kg (190 lb 11.2 oz) 86.3 kg (190 lb 4.1 oz)    Examination:  General exam: Appears calm and comfortable. Respiratory system: Clear to auscultation. Respiratory effort normal. Cardiovascular system: S1 & S2 heard, RRR. No murmurs Gastrointestinal system: Abdomen is nondistended, soft and nontender. Normal bowel sounds heard. Central nervous system: Alert and oriented. No focal neurological deficits. Extremities: No edema. No calf tenderness Skin: No cyanosis.                                   Psychiatry: Judgement and insight appear normal. Mood & affect appropriate.     Data Reviewed: I have personally reviewed following labs and imaging studies  CBC:  Recent Labs Lab 12/07/16 2313  12/08/16 0909  12/09/16 8250 12/09/16 1809 12/12/16 0823 12/13/16 0522 12/14/16  0455  WBC 10.8*  < > 10.6*  < > 10.7* 11.6* 14.0* 14.7* 20.5*  NEUTROABS 8.0*  --  8.6*  --   --   --   --   --   --   HGB 6.1*  < > 5.3*  < > 7.8* 7.7* 9.2* 8.9* 9.7*  HCT 19.0*  < > 16.9*  < > 23.9* 23.5* 28.3* 26.4* 28.7*  MCV 75.4*  < > 74.8*  < > 76.1* 75.1* 75.1* 74.2* 74.7*  PLT 512*  < > 456*  < > 476* 497* 633* 577* 658*  < > = values in this interval not displayed. Basic Metabolic Panel:  Recent Labs Lab 12/08/16 0911  12/10/16 0248 12/11/16 0148 12/12/16 0823 12/13/16 0522 12/14/16 0455  NA 136  --  138  --  138 137 138  K 4.7  --  3.8  --  2.8* 2.8* 2.9*  CL 111   --  107  --  102 102 102  CO2 13*  --  15*  --  19* 20* 20*  GLUCOSE 187*  --  188*  --  194* 142* 86  BUN 65*  --  85*  --  108* 103* 106*  CREATININE 4.26*  < > 4.49* 4.52* 4.11* 4.02* 4.39*  CALCIUM 8.4*  --  8.9  --  8.6* 8.7* 9.1  MG  --   --  2.0 2.0 2.1 2.1  --   PHOS  --   --  6.5* 6.2* 7.8* 7.3*  --   < > = values in this interval not displayed. GFR: Estimated Creatinine Clearance: 15.8 mL/min (A) (by C-G formula based on SCr of 4.39 mg/dL (H)). Liver Function Tests:  Recent Labs Lab 12/08/16 0001 12/10/16 0248 12/12/16 0823  AST 36 15 16  ALT 30 23 18   ALKPHOS 79 64 51  BILITOT 0.5 0.7 0.7  PROT 7.6 7.3 7.4  ALBUMIN 2.9* 2.8* 2.9*   No results for input(s): LIPASE, AMYLASE in the last 168 hours. No results for input(s): AMMONIA in the last 168 hours. Coagulation Profile:  Recent Labs Lab 12/08/16 0001 12/10/16 0248 12/14/16 0455  INR 1.21 1.35 1.14   Cardiac Enzymes:  Recent Labs Lab 12/08/16 0836 12/08/16 1520 12/08/16 1851 12/09/16 0212  CKTOTAL 749*  --   --   --   CKMB 12.3*  --   --   --   TROPONINI  --  <0.03 <0.03 <0.03   BNP (last 3 results) No results for input(s): PROBNP in the last 8760 hours. HbA1C: No results for input(s): HGBA1C in the last 72 hours. CBG:  Recent Labs Lab 12/13/16 2044 12/14/16 0030 12/14/16 0410 12/14/16 0810 12/14/16 0923  GLUCAP 364* 307* 103* 96 117*   Lipid Profile: No results for input(s): CHOL, HDL, LDLCALC, TRIG, CHOLHDL, LDLDIRECT in the last 72 hours. Thyroid Function Tests: No results for input(s): TSH, T4TOTAL, FREET4, T3FREE, THYROIDAB in the last 72 hours. Anemia Panel:  Recent Labs  12/14/16 0455  TIBC 244*  IRON 51   Sepsis Labs:  Recent Labs Lab 12/08/16 0122 12/08/16 0143 12/08/16 0836 12/09/16 0212 12/10/16 0248  PROCALCITON 2.66  --  3.33 3.90 2.80  LATICACIDVEN  --  1.1  --   --   --     Recent Results (from the past 240 hour(s))  Culture, blood (routine x 2) Call MD  if unable to obtain prior to antibiotics being given     Status: None   Collection  Time: 12/07/16 11:10 PM  Result Value Ref Range Status   Specimen Description BLOOD BLOOD RIGHT FOREARM  Final   Special Requests BOTTLES DRAWN AEROBIC AND ANAEROBIC 5ML  Final   Culture   Final    NO GROWTH 5 DAYS Performed at [facility], [place] [address] [place], [place] [address]    Report Status 12/13/2016 FINAL  Final  Culture, blood (routine x 2) Call MD if unable to obtain prior to antibiotics being given     Status: None   Collection Time: 12/07/16 11:12 PM  Result Value Ref Range Status   Specimen Description BLOOD LEFT HAND  Final   Special Requests BOTTLES DRAWN AEROBIC ONLY 6ML  Final   Culture   Final    NO GROWTH 5 DAYS Performed at [facility], [place] [address], [place], [place] [address]    Report Status 12/13/2016 FINAL  Final  Culture, sputum-assessment     Status: None   Collection Time: 12/08/16  1:08 AM  Result Value Ref Range Status   Specimen Description EXPECTORATED SPUTUM  Final   Special Requests NONE  Final   Sputum evaluation   Final    THIS SPECIMEN IS ACCEPTABLE FOR SPUTUM CULTURE Performed at [facility], [place] [address] [place], [place] [address]    Report Status 12/08/2016 FINAL  Final  Culture, respiratory (NON-Expectorated)     Status: None   Collection Time: 12/08/16  1:08 AM  Result Value Ref Range Status   Specimen Description EXPECTORATED SPUTUM  Final   Special Requests NONE Reflexed from [id number]  Final   Gram Stain   Final    FEW SQUAMOUS EPITHELIAL CELLS PRESENT FEW WBC PRESENT, PREDOMINANTLY MONONUCLEAR FEW GRAM POSITIVE RODS FEW GRAM POSITIVE COCCI IN PAIRS FEW GRAM NEGATIVE RODS    Culture   Final    Consistent with normal respiratory flora. Performed at [facility], [place] [address], [place], [place] [address]    Report Status 12/10/2016 FINAL  Final  MRSA PCR Screening     Status: None   Collection Time: 12/08/16  4:22  AM  Result Value Ref Range Status   MRSA by PCR NEGATIVE NEGATIVE Final    Comment:        The GeneXpert MRSA Assay (FDA approved for NASAL specimens only), is one component of a comprehensive MRSA colonization surveillance program. It is not intended to diagnose MRSA infection nor to guide or monitor treatment for MRSA infections.   Respiratory Panel by PCR     Status: None   Collection Time: 12/08/16  8:30 AM  Result Value Ref Range Status   Adenovirus NOT DETECTED NOT DETECTED Final   Coronavirus 229E NOT DETECTED NOT DETECTED Final   Coronavirus HKU1 NOT DETECTED NOT DETECTED Final   Coronavirus NL63 NOT DETECTED NOT DETECTED Final   Coronavirus OC43 NOT DETECTED NOT DETECTED Final   Metapneumovirus NOT DETECTED NOT DETECTED Final   Rhinovirus / Enterovirus NOT DETECTED NOT DETECTED Final   Influenza A NOT DETECTED NOT DETECTED Final   Influenza B NOT DETECTED NOT DETECTED Final   Parainfluenza Virus 1 NOT DETECTED NOT DETECTED Final   Parainfluenza Virus 2 NOT DETECTED NOT DETECTED Final   Parainfluenza Virus 3 NOT DETECTED NOT DETECTED Final   Parainfluenza Virus 4 NOT DETECTED NOT DETECTED Final   Respiratory Syncytial Virus NOT DETECTED NOT DETECTED Final   Bordetella pertussis NOT DETECTED NOT DETECTED Final   Chlamydophila pneumoniae NOT DETECTED  NOT DETECTED Final   Mycoplasma pneumoniae NOT DETECTED NOT DETECTED Final         Radiology Studies: US Biopsy  Result Date: 12-20-16 INDICATION: Hypertension, renal failure, vasculitis EXAM: ULTRASOUND GUIDED CORE BIOPSY OF LEFT KIDNEY MEDICATIONS: 1% LIDOCAINE LOCALLY ANESTHESIA/SEDATION: Versed 1.0mg  IV; Fentanyl 24mcg IV; Moderate Sedation Time:  10 MINUTES The patient was continuously monitored during the procedure by the interventional radiology nurse under my direct supervision. FLUOROSCOPY TIME:  Fluoroscopy Time: NONE. COMPLICATIONS: None immediate. PROCEDURE: The procedure, risks, benefits, and alternatives  were explained to the patient. Questions regarding the procedure were encouraged and answered. The patient understands and consents to the procedure. Patient positioned prone. Preliminary ultrasound performed. Left kidney lower pole was localized. Overlying skin marked. The left posterior flank was prepped with ChloraPrep in a sterile fashion, and a sterile drape was applied covering the operative field. A sterile gown and sterile gloves were used for the procedure. Local anesthesia was provided with 1% Lidocaine. Under sterile conditions and local anesthesia, a 16 gauge access needle was advanced to the left kidney lower pole under direct ultrasound. Through the access, 2 16 gauge core biopsies obtained of the lower pole cortex. Samples placed in saline. These were intact and non fragmented. Postprocedure imaging demonstrates no hemorrhage or hematoma. Patient tolerated the biopsy well. FINDINGS: Imaging confirms needle placement to the left kidney lower pole for core biopsy IMPRESSION: Successful ultrasound left kidney biopsy Electronically Signed   By: Jerilynn Mages.  Shick M.D.   On: 12/20/2016 10:31        Scheduled Meds: . atropine      . darbepoetin (ARANESP) injection - NON-DIALYSIS  150 mcg Subcutaneous Q Thu-1800  . dextromethorphan-guaiFENesin  1 tablet Oral BID  . fentaNYL      . folic acid  1 mg Oral Daily  . furosemide  40 mg Intravenous Q12H  . hydrALAZINE  75 mg Oral TID  . insulin aspart  0-20 Units Subcutaneous Q4H  . ipratropium  0.5 mg Nebulization TID  . levalbuterol  1.25 mg Nebulization TID  . lidocaine      . midazolam      . multivitamin with minerals  1 tablet Oral Daily  . nicotine  21 mg Transdermal Daily  . pantoprazole  40 mg Oral Daily  . polyethylene glycol  17 g Oral Daily  . potassium chloride  40 mEq Oral BID  . predniSONE  80 mg Oral Q breakfast  . sulfamethoxazole-trimethoprim  1 tablet Oral Once per day on Mon Wed Fri  . thiamine  100 mg Oral Daily   Continuous  Infusions: . sodium chloride 10 mL/hr at 12/11/16 2000     LOS: 6 days     Cordelia Poche, MD [facility] December 20, 2016, 11:51 AM Pager: [phone]  If 7PM-7AM, please contact night-coverage [email or web address] Password [email or web address] 12/20/16, 11:51 AM

## 2016-12-14 NOTE — Sedation Documentation (Signed)
Vasovagal response Dr Reesa Chew notified, blood sugar 117, 250 cc saline bolus with immediate return to pre procedure status. VS during bp 83/40, heart rate 50 now 120/70 hr 78.

## 2016-12-14 NOTE — Procedures (Signed)
Renal failure, possible autoimmune vasculitis  s/p [place] left renal bx  No comp Stable Path pending EBL 0 FULL report in PACS

## 2016-12-14 NOTE — Progress Notes (Signed)
Subjective: Interval History: has complaints wants to go home.  61 yr female apparently here for 1 wk. With hemoptysis , ^ Cr.  Signif resp insuffic initially now resolving.  Found to be ANCA pos. And given bolus steroids, Ritux per pulm. Cr in 4s.  Renal bx done today. We were called yest to see.  Objective: Vital signs in last 24 hours: Temp:  [98 F (36.7 C)-98.7 F (37.1 C)] 98.7 F (37.1 C) (04/06 0800) Pulse Rate:  [63-80] 78 (04/06 0944) Resp:  [16-18] 16 (04/06 0937) BP: (100-154)/(69-90) 100/70 (04/06 0944) SpO2:  [91 %-100 %] 97 % (04/06 0944) Weight change:   Intake/Output from previous day: 04/05 0701 - 04/06 0700 In: 440 [P.O.:440] Out: -  Intake/Output this shift: Total I/O In: 46.2 [I.V.:46.2] Out: -   General appearance: alert, cooperative, no distress and moderately obese Resp: diminished breath sounds bilaterally Cardio: S1, S2 normal and systolic murmur: systolic ejection 2/6, decrescendo at 2nd left intercostal space GI: soft, non-tender; bowel sounds normal; no masses,  no organomegaly Extremities: extremities normal, atraumatic, no cyanosis or edema  Lab Results:  Recent Labs  12/13/16 0522 12/14/16 0455  WBC 14.7* 20.5*  HGB 8.9* 9.7*  HCT 26.4* 28.7*  PLT 577* 658*   BMET:  Recent Labs  12/13/16 0522 12/14/16 0455  NA 137 138  K 2.8* 2.9*  CL 102 102  CO2 20* 20*  GLUCOSE 142* 86  BUN 103* 106*  CREATININE 4.02* 4.39*  CALCIUM 8.7* 9.1   No results for input(s): PTH in the last 72 hours. Iron Studies:  Recent Labs  12/14/16 0455  IRON 51  TIBC 244*    Studies/Results: US Biopsy  Result Date: 12/14/2016 INDICATION: Hypertension, renal failure, vasculitis EXAM: ULTRASOUND GUIDED CORE BIOPSY OF LEFT KIDNEY MEDICATIONS: 1% LIDOCAINE LOCALLY ANESTHESIA/SEDATION: Versed 1.0mg  IV; Fentanyl 61mcg IV; Moderate Sedation Time:  10 MINUTES The patient was continuously monitored during the procedure by the interventional radiology nurse  under my direct supervision. FLUOROSCOPY TIME:  Fluoroscopy Time: NONE. COMPLICATIONS: None immediate. PROCEDURE: The procedure, risks, benefits, and alternatives were explained to the patient. Questions regarding the procedure were encouraged and answered. The patient understands and consents to the procedure. Patient positioned prone. Preliminary ultrasound performed. Left kidney lower pole was localized. Overlying skin marked. The left posterior flank was prepped with ChloraPrep in a sterile fashion, and a sterile drape was applied covering the operative field. A sterile gown and sterile gloves were used for the procedure. Local anesthesia was provided with 1% Lidocaine. Under sterile conditions and local anesthesia, a 16 gauge access needle was advanced to the left kidney lower pole under direct ultrasound. Through the access, 2 16 gauge core biopsies obtained of the lower pole cortex. Samples placed in saline. These were intact and non fragmented. Postprocedure imaging demonstrates no hemorrhage or hematoma. Patient tolerated the biopsy well. FINDINGS: Imaging confirms needle placement to the left kidney lower pole for core biopsy IMPRESSION: Successful ultrasound left kidney biopsy Electronically Signed   By: Jerilynn Mages.  Shick M.D.   On: 12/14/2016 10:31    I have reviewed the patient's current medications.  Assessment/Plan: 1 ^ Cr, presumeably ANCA dz.  BX pending. Has received Ritux and steroids. No change 2 HTn controlled  3 Hemop resolved 4 substance abuse P change to po Lasix., can d/c soon . Primary f/u Pulm    LOS: 6 days   Ranbir Chew L 12/14/2016,12:03 PM

## 2016-12-14 NOTE — Care Management Note (Signed)
Case Management Note  Patient Details  Name: Baley Shands MRN: [id number] Date of Birth: 06-Feb-1956  Subjective/Objective:                 Patient admitted with AKI, renal Bx 4/6. PT eval rec 3in1 and RW and HH PT, unfortunately this is not covered through [facility]. CM went to speak with patient, however in procedure.   Action/Plan:  CM will continue to follow, and assist with DC  Expected Discharge Date:                  Expected Discharge Plan:  Home/Self Care  In-House Referral:     Discharge planning Services  CM Consult  Post Acute Care Choice:  Durable Medical Equipment Choice offered to:     DME Arranged:    DME Agency:     HH Arranged:    [place] Agency:     Status of Service:  In process, will continue to follow  If discussed at Long Length of Stay Meetings, dates discussed:    Additional Comments:  Carles Collet, RN 12/14/2016, 9:41 AM

## 2016-12-14 NOTE — Progress Notes (Signed)
[facility] Attending Note  Presenting HPI:  61 y.o. female with history of chronic sinusitis for 2 years as well as skin lesions. Patient now with features of alveolar hemorrhage on CT imaging and acute renal failure. BAVSC score of 23 on admission. Patient uses tobacco and marijuana but no cocaine abuse. Started on IV steroids and weekly Rituxan.  Subjective:   No cough or hemoptysis. A little winded presently, but just ambulated to and from restroom. At rest she has no dyspnea.   Review of Systems:  Denies fever, chills, or sweats. No abdominal pain, nausea, or diarrhea. No headache or vision changes.  Temp:  [98 F (36.7 C)-98.7 F (37.1 C)] 98.7 F (37.1 C) (04/06 0800) Pulse Rate:  [63-80] 78 (04/06 0944) Resp:  [16-18] 16 (04/06 0937) BP: (100-154)/(69-90) 100/70 (04/06 0944) SpO2:  [91 %-100 %] 97 % (04/06 0944)  Physical Exam:  General:  African [place] female in no distress. Awake. Alert. Integument:  Grossly intact, Warm, dry.  HEENT:  [place]/AT, PERRL, no JVD noted Pulmonary:  Clear bilateral breath sounds with good aeration.   Cardiovascular:  RRR, no MRG Abdomen:  Soft. Nontender. Normal bowel sounds. Neurological:  Alert, oriented, non-focal  CBC Latest Ref Rng & Units 12/14/2016 12/13/2016 12/12/2016  WBC 4.0 - 10.5 K/uL 20.5(H) 14.7(H) 14.0(H)  Hemoglobin 12.0 - 15.0 g/dL 9.7(L) 8.9(L) 9.2(L)  Hematocrit 36.0 - 46.0 % 28.7(L) 26.4(L) 28.3(L)  Platelets 150 - 400 K/uL 658(H) 577(H) 633(H)    BMP Latest Ref Rng & Units 12/14/2016 12/13/2016 12/12/2016  Glucose 65 - 99 mg/dL 86 142(H) 194(H)  BUN 6 - 20 mg/dL 106(H) 103(H) 108(H)  Creatinine 0.44 - 1.00 mg/dL 4.39(H) 4.02(H) 4.11(H)  Sodium 135 - 145 mmol/L 138 137 138  Potassium 3.5 - 5.1 mmol/L 2.9(L) 2.8(L) 2.8(L)  Chloride 101 - 111 mmol/L 102 102 102  CO2 22 - 32 mmol/L 20(L) 20(L) 19(L)  Calcium 8.9 - 10.3 mg/dL 9.1 8.7(L) 8.6(L)    IMAGING/STUDIES: CT CHEST W/O 3/31: IMPRESSION: 1. Dense  right-sided airspace opacification is compatible with pneumonia, mildly worsened from the recent prior chest radiograph. Mild hazy opacity at the left lung apex is also concerning for pneumonia. 2. 1.3 cm right paratracheal node may reflect the underlying infection. 3. Mild cardiomegaly.  Mild coronary artery calcifications seen. RENAL U/S 3/31:  Echogenic kidneys consistent with medical renal disease. RIGHT kidney measures smaller than the LEFT which may be technical or, reflect atrophy. CT ABD/PELVIS W/O 3/31: IMPRESSION: 1. No definite acute abnormality in abdomen/pelvis. Moderate stool burden in the proximal colon can be seen with constipation. 2. Equivocal lower mesenteric edema without associated bowel abnormality. This is likely systemic etiology and can be seen with third-spacing. 3. Aortic atherosclerosis. CT HEAD W/O 3/31:  No acute intracranial abnormality.  MAXILLOFACIAL CT W/O 3/31:  No acute facial bone abnormality. Mild chronic change at the temporomandibular joints. Multiple absent teeth, chronic. PORT CXR 4/4:  Personally reviewed by me.   MICROBIOLOGY: Blood Cultures x2 3/30:  Negative MRSA PCR 3/31:  Negative Sputum Culture 3/31:  Normal Respiratory [place]  Respiratory Viral Panel PCR 3/31:  Negative  Influenza A/B PCR 3/31:  Negative  HIV 3/31:  Nonreactive  Urine Streptococcal Antigen 3/31:  Negative  Urine Legionella Antigen 3/31:  Negative   ANTIBIOTICS: Azithromycin 3/30 (x1 dose) Rocephin 3/30 (x1 dose) Vancomycin 3/31 (x1 dose) Zosyn 3/31 - 4/6 Bactrim DS M/W/F 4/4 >>>  PATHOLOGY: Skin Biopsy 4/3 > Scar  SEROLOGIES: ANA:  Positive CRP:  19.6 ESR:  >140 G-6PD:  Normal (12.7) CK:  749 ALDOLASE:  10.2 SCL-70:  <0.2 SSA:  <0.2 SSB:  <0.2 ANTI-CCP:  9 RF:  17.1 DS DNA AB:  6 C4:  18 C3:  128 Anti-GBM:  5 P-ANCA:  1:320 C-ANCA:  <1:20 Atypical P-ANCA:  <1:20  EVENTS: 03/31 - Rituxan dose #1 & started Solu-Medrol 235m IV  q6hr  ASSESSMENT/PLAN:  61y.o. female with alveolar hemorrhage & vasculitis. Patient's biopsy result is pending. Serologies consistent with ANCA associated vasculitis. Seems to be clinically improving on Solu-Medrol & Rituxan.   DAH secondary to newly diagnosed systemic vasculitis syndrome - ANCA. - Transition to prednisone 829mdaily per nephrology - Rituxan treatmen #2 planned for 4/7. - Supplemental O2 as needed to keep O2 sats > 92%  Immunocompromised host - Prophylactic bactrim MWF  PaGeorgann HousekeeperAGACNP-BC [facility] Pager [phone] ([phone]/02/2017 12:02 PM

## 2016-12-15 LAB — GLUCOSE, CAPILLARY
GLUCOSE-CAPILLARY: 91 mg/dL (ref 65–99)
GLUCOSE-CAPILLARY: 100 mg/dL — AB (ref 65–99)
GLUCOSE-CAPILLARY: 149 mg/dL — AB (ref 65–99)
Glucose-Capillary: 511 mg/dL (ref 65–99)
Glucose-Capillary: 434 mg/dL — ABNORMAL HIGH (ref 65–99)
Glucose-Capillary: 239 mg/dL — ABNORMAL HIGH (ref 65–99)

## 2016-12-15 LAB — CBC
HEMATOCRIT: 29.4 % — AB (ref 36.0–46.0)
Hemoglobin: 9.6 g/dL — ABNORMAL LOW (ref 12.0–15.0)
MCH: 24.7 pg — AB (ref 26.0–34.0)
MCHC: 32.7 g/dL (ref 30.0–36.0)
MCV: 75.6 fL — AB (ref 78.0–100.0)
PLATELETS: 623 10*3/uL — AB (ref 150–400)
RBC: 3.89 MIL/uL (ref 3.87–5.11)
RDW: 17.6 % — AB (ref 11.5–15.5)
WBC: 15.6 10*3/uL — AB (ref 4.0–10.5)

## 2016-12-15 LAB — RENAL FUNCTION PANEL
Albumin: 2.7 g/dL — ABNORMAL LOW (ref 3.5–5.0)
Anion gap: 13 (ref 5–15)
BUN: 98 mg/dL — ABNORMAL HIGH (ref 6–20)
CALCIUM: 9.4 mg/dL (ref 8.9–10.3)
CHLORIDE: 102 mmol/L (ref 101–111)
CO2: 20 mmol/L — AB (ref 22–32)
Creatinine, Ser: 4.12 mg/dL — ABNORMAL HIGH (ref 0.44–1.00)
GFR calc Af Amer: 12 mL/min — ABNORMAL LOW (ref >60)
GFR calc non Af Amer: 11 mL/min — ABNORMAL LOW (ref >60)
Glucose, Bld: 98 mg/dL (ref 65–99)
POTASSIUM: 3.7 mmol/L (ref 3.5–5.1)
Phosphorus: 5.8 mg/dL — ABNORMAL HIGH (ref 2.5–4.6)
Sodium: 135 mmol/L (ref 135–145)

## 2016-12-15 MED ORDER — LEVALBUTEROL HCL 1.25 MG/0.5ML IN NEBU
1.2500 mg | INHALATION_SOLUTION | Freq: Four times a day (QID) | RESPIRATORY_TRACT | Status: DC | PRN
Start: 2016-12-15 — End: 2016-12-16

## 2016-12-15 NOTE — Progress Notes (Signed)
PROGRESS NOTE    Catherine Aguilar  WSF:[id number] DOB: 1956/09/02 DOA: 12/07/2016 PCP: Maggie Font, MD   Brief Narrative: Catherine Aguilar is a 61 y.o. female with history of substance abuse. She presented with dyspnea and hemoptysis with concerns for autoimmune vasculitis.   Assessment & Plan:   Principal Problem:   Systemic vasculitis syndrome (HCC) Active Problems:   CVA (cerebral vascular accident) ([place])   Essential hypertension   HLD (hyperlipidemia)   Tobacco abuse   AKI (acute kidney injury) ([place])   Sepsis ([place])   Hemoptysis   CAP (community acquired pneumonia)   Symptomatic anemia   Acute respiratory failure with hypoxia (HCC)   Wegener's granulomatosis with renal involvement ([place])   Intra-alveolar hemorrhage   Acute renal failure (ARF) (HCC)   Cutaneous vasculitis   Substance abuse   Goals of care, counseling/discussion   Diffuse pulmonary alveolar hemorrhage   Systemic vasculitis syndrome Involvement of lungs, sinuses and kidneys. Skin biopsy without evidence of vasculitis.  -pulmonology recommendations: rituximab, steroids, bactrim -nephrology recommendations: oral steroids, aranesp  Acute hypoxic respiratory failure secondary to alveolar hemorrhage Improved. S/p 2 units of PRBC. On room air. No hemoptysis  History of substance abuse Counseled  Essential hypertension -continued hydralazine  History of CVA -continue statin -continue to hold aspirin secondary to bleeding  Alcohol abuse -CIWA at admission  Acute kidney injury Stable.  Hypokalemia -replete   DVT prophylaxis: SCDs Code Status: Full code Family Communication: None at bedside Disposition Plan: Discharge home pending clearance from pulmonology standpoint   Consultants:   PCCM/Pulmonology  General surgery  Nephrology  Procedures:   Skin biopsy  Renal biopsy  Antimicrobials:  Zosyn  Bactrim   Subjective: No hemoptysis. No dyspnea. Wants to go  home.  Objective: Vitals:   12/15/16 0456 12/15/16 0851 12/15/16 0906 12/15/16 1352  BP: (!) 133/54  122/63 110/64  Pulse: 63  84 92  Resp: 16   18  Temp: 97.3 F (36.3 C)   98.6 F (37 C)  TempSrc: Oral   Oral  SpO2: 98% 97%  93%  Weight:      Height:        Intake/Output Summary (Last 24 hours) at 12/15/16 1609 Last data filed at 12/15/16 1354  Gross per 24 hour  Intake           393.83 ml  Output                0 ml  Net           393.83 ml   Filed Weights   12/08/16 0420 12/11/16 2102 12/12/16 0653  Weight: 95.4 kg (210 lb 5.1 oz) 86.5 kg (190 lb 11.2 oz) 86.3 kg (190 lb 4.1 oz)    Examination:  General exam: Appears calm and comfortable. Respiratory system: Clear to auscultation. Respiratory effort normal. Cardiovascular system: S1 & S2 heard, RRR. No murmurs Gastrointestinal system: Abdomen is nondistended, soft and nontender. Normal bowel sounds heard. Central nervous system: Alert and oriented. No focal neurological deficits. Extremities: No edema. No calf tenderness Skin: No cyanosis.                                   Psychiatry: Judgement and insight appear normal. Mood & affect appropriate.     Data Reviewed: I have personally reviewed following labs and imaging studies  CBC:  Recent Labs Lab 12/09/16 1809 12/12/16 0174 12/13/16 0522 12/14/16 9449  12/15/16 0501  WBC 11.6* 14.0* 14.7* 20.5* 15.6*  HGB 7.7* 9.2* 8.9* 9.7* 9.6*  HCT 23.5* 28.3* 26.4* 28.7* 29.4*  MCV 75.1* 75.1* 74.2* 74.7* 75.6*  PLT 497* 633* 577* 658* 790*   Basic Metabolic Panel:  Recent Labs Lab 12/10/16 0248 12/11/16 0148 12/12/16 0823 12/13/16 0522 12/14/16 0455 12/15/16 0501  NA 138  --  138 137 138 135  K 3.8  --  2.8* 2.8* 2.9* 3.7  CL 107  --  102 102 102 102  CO2 15*  --  19* 20* 20* 20*  GLUCOSE 188*  --  194* 142* 86 98  BUN 85*  --  108* 103* 106* 98*  CREATININE 4.49* 4.52* 4.11* 4.02* 4.39* 4.12*  CALCIUM 8.9  --  8.6* 8.7* 9.1 9.4  MG 2.0 2.0 2.1  2.1  --   --   PHOS 6.5* 6.2* 7.8* 7.3*  --  5.8*   GFR: Estimated Creatinine Clearance: 16.8 mL/min (A) (by C-G formula based on SCr of 4.12 mg/dL (H)). Liver Function Tests:  Recent Labs Lab 12/10/16 0248 12/12/16 0823 12/15/16 0501  AST 15 16  --   ALT 23 18  --   ALKPHOS 64 51  --   BILITOT 0.7 0.7  --   PROT 7.3 7.4  --   ALBUMIN 2.8* 2.9* 2.7*   No results for input(s): LIPASE, AMYLASE in the last 168 hours. No results for input(s): AMMONIA in the last 168 hours. Coagulation Profile:  Recent Labs Lab 12/10/16 0248 12/14/16 0455  INR 1.35 1.14   Cardiac Enzymes:  Recent Labs Lab 12/08/16 1851 12/09/16 0212  TROPONINI <0.03 <0.03   BNP (last 3 results) No results for input(s): PROBNP in the last 8760 hours. HbA1C: No results for input(s): HGBA1C in the last 72 hours. CBG:  Recent Labs Lab 12/14/16 1955 12/14/16 2339 12/15/16 0457 12/15/16 0756 12/15/16 1127  GLUCAP 294* 108* 91 100* 149*   Lipid Profile: No results for input(s): CHOL, HDL, LDLCALC, TRIG, CHOLHDL, LDLDIRECT in the last 72 hours. Thyroid Function Tests: No results for input(s): TSH, T4TOTAL, FREET4, T3FREE, THYROIDAB in the last 72 hours. Anemia Panel:  Recent Labs  12/14/16 0455  TIBC 244*  IRON 51   Sepsis Labs:  Recent Labs Lab 12/09/16 0212 12/10/16 0248  PROCALCITON 3.90 2.80    Recent Results (from the past 240 hour(s))  Culture, blood (routine x 2) Call MD if unable to obtain prior to antibiotics being given     Status: None   Collection Time: 12/07/16 11:10 PM  Result Value Ref Range Status   Specimen Description BLOOD BLOOD RIGHT FOREARM  Final   Special Requests BOTTLES DRAWN AEROBIC AND ANAEROBIC 5ML  Final   Culture   Final    NO GROWTH 5 DAYS Performed at [facility], [place] [address] [place], [place] [address]    Report Status 12/13/2016 FINAL  Final  Culture, blood (routine x 2) Call MD if unable to obtain prior to antibiotics being given      Status: None   Collection Time: 12/07/16 11:12 PM  Result Value Ref Range Status   Specimen Description BLOOD LEFT HAND  Final   Special Requests BOTTLES DRAWN AEROBIC ONLY 6ML  Final   Culture   Final    NO GROWTH 5 DAYS Performed at [facility], [place] [address] [place], [place] [address]    Report Status 12/13/2016 FINAL  Final  Culture, sputum-assessment  Status: None   Collection Time: 12/08/16  1:08 AM  Result Value Ref Range Status   Specimen Description EXPECTORATED SPUTUM  Final   Special Requests NONE  Final   Sputum evaluation   Final    THIS SPECIMEN IS ACCEPTABLE FOR SPUTUM CULTURE Performed at [facility], [address], [place], [place] [address]    Report Status 12/08/2016 FINAL  Final  Culture, respiratory (NON-Expectorated)     Status: None   Collection Time: 12/08/16  1:08 AM  Result Value Ref Range Status   Specimen Description EXPECTORATED SPUTUM  Final   Special Requests NONE Reflexed from [id number]  Final   Gram Stain   Final    FEW SQUAMOUS EPITHELIAL CELLS PRESENT FEW WBC PRESENT, PREDOMINANTLY MONONUCLEAR FEW GRAM POSITIVE RODS FEW GRAM POSITIVE COCCI IN PAIRS FEW GRAM NEGATIVE RODS    Culture   Final    Consistent with normal respiratory flora. Performed at [facility], [place] [address] [place], [place] [address]    Report Status 12/10/2016 FINAL  Final  MRSA PCR Screening     Status: None   Collection Time: 12/08/16  4:22 AM  Result Value Ref Range Status   MRSA by PCR NEGATIVE NEGATIVE Final    Comment:        The GeneXpert MRSA Assay (FDA approved for NASAL specimens only), is one component of a comprehensive MRSA colonization surveillance program. It is not intended to diagnose MRSA infection nor to guide or monitor treatment for MRSA infections.   Respiratory Panel by PCR     Status: None   Collection Time: 12/08/16  8:30 AM  Result Value Ref Range Status   Adenovirus NOT DETECTED NOT DETECTED Final    Coronavirus 229E NOT DETECTED NOT DETECTED Final   Coronavirus HKU1 NOT DETECTED NOT DETECTED Final   Coronavirus NL63 NOT DETECTED NOT DETECTED Final   Coronavirus OC43 NOT DETECTED NOT DETECTED Final   Metapneumovirus NOT DETECTED NOT DETECTED Final   Rhinovirus / Enterovirus NOT DETECTED NOT DETECTED Final   Influenza A NOT DETECTED NOT DETECTED Final   Influenza B NOT DETECTED NOT DETECTED Final   Parainfluenza Virus 1 NOT DETECTED NOT DETECTED Final   Parainfluenza Virus 2 NOT DETECTED NOT DETECTED Final   Parainfluenza Virus 3 NOT DETECTED NOT DETECTED Final   Parainfluenza Virus 4 NOT DETECTED NOT DETECTED Final   Respiratory Syncytial Virus NOT DETECTED NOT DETECTED Final   Bordetella pertussis NOT DETECTED NOT DETECTED Final   Chlamydophila pneumoniae NOT DETECTED NOT DETECTED Final   Mycoplasma pneumoniae NOT DETECTED NOT DETECTED Final         Radiology Studies: US Biopsy  Result Date: 12/24/16 INDICATION: Hypertension, renal failure, vasculitis EXAM: ULTRASOUND GUIDED CORE BIOPSY OF LEFT KIDNEY MEDICATIONS: 1% LIDOCAINE LOCALLY ANESTHESIA/SEDATION: Versed 1.0mg  IV; Fentanyl 58mcg IV; Moderate Sedation Time:  10 MINUTES The patient was continuously monitored during the procedure by the interventional radiology nurse under my direct supervision. FLUOROSCOPY TIME:  Fluoroscopy Time: NONE. COMPLICATIONS: None immediate. PROCEDURE: The procedure, risks, benefits, and alternatives were explained to the patient. Questions regarding the procedure were encouraged and answered. The patient understands and consents to the procedure. Patient positioned prone. Preliminary ultrasound performed. Left kidney lower pole was localized. Overlying skin marked. The left posterior flank was prepped with ChloraPrep in a sterile fashion, and a sterile drape was applied covering the operative field. A sterile gown and sterile gloves were used for the procedure. Local anesthesia was provided with  1%  Lidocaine. Under sterile conditions and local anesthesia, a 16 gauge access needle was advanced to the left kidney lower pole under direct ultrasound. Through the access, 2 16 gauge core biopsies obtained of the lower pole cortex. Samples placed in saline. These were intact and non fragmented. Postprocedure imaging demonstrates no hemorrhage or hematoma. Patient tolerated the biopsy well. FINDINGS: Imaging confirms needle placement to the left kidney lower pole for core biopsy IMPRESSION: Successful ultrasound left kidney biopsy Electronically Signed   By: Jerilynn Mages.  Shick M.D.   On: 12/14/2016 10:31        Scheduled Meds: . darbepoetin (ARANESP) injection - NON-DIALYSIS  150 mcg Subcutaneous Q Thu-1800  . dextromethorphan-guaiFENesin  1 tablet Oral BID  . furosemide  80 mg Oral Daily  . hydrALAZINE  75 mg Oral TID  . insulin aspart  0-20 Units Subcutaneous Q4H  . multivitamin with minerals  1 tablet Oral Daily  . nicotine  21 mg Transdermal Daily  . pantoprazole  40 mg Oral Daily  . polyethylene glycol  17 g Oral Daily  . predniSONE  80 mg Oral Q breakfast  . sulfamethoxazole-trimethoprim  1 tablet Oral Once per day on Mon Wed Fri   Continuous Infusions: . sodium chloride 10 mL/hr at 12/11/16 2000     LOS: 7 days     Cordelia Poche, MD [facility] 12/15/2016, 4:09 PM Pager: [phone]  If 7PM-7AM, please contact night-coverage [email or web address] Password [email or web address] 12/15/2016, 4:09 PM

## 2016-12-15 NOTE — Progress Notes (Signed)
Subjective: Interval History: has complaints wants to go home.  Objective: Vital signs in last 24 hours: Temp:  [97.3 F (36.3 C)-98.8 F (37.1 C)] 97.3 F (36.3 C) (04/07 0456) Pulse Rate:  [53-92] 84 (04/07 0906) Resp:  [16-18] 16 (04/07 0456) BP: (97-142)/(54-80) 122/63 (04/07 0906) SpO2:  [93 %-99 %] 97 % (04/07 0851) FiO2 (%):  [21 %] 21 % (04/06 1355) Weight change:   Intake/Output from previous day: 04/06 0701 - 04/07 0700 In: 200 [I.V.:200] Out: -  Intake/Output this shift: Total I/O In: 120 [P.O.:120] Out: -   General appearance: alert, cooperative and no distress Resp: diminished breath sounds bilaterally Cardio: S1, S2 normal and systolic murmur: holosystolic 2/6, blowing at apex GI: soft, non-tender; bowel sounds normal; no masses,  no organomegaly Extremities: extremities normal, atraumatic, no cyanosis or edema  Lab Results:  Recent Labs  12/14/16 0455 12/15/16 0501  WBC 20.5* 15.6*  HGB 9.7* 9.6*  HCT 28.7* 29.4*  PLT 658* 623*   BMET:  Recent Labs  12/14/16 0455 12/15/16 0501  NA 138 135  K 2.9* 3.7  CL 102 102  CO2 20* 20*  GLUCOSE 86 98  BUN 106* 98*  CREATININE 4.39* 4.12*  CALCIUM 9.1 9.4   No results for input(s): PTH in the last 72 hours. Iron Studies:  Recent Labs  12/14/16 0455  IRON 51  TIBC 244*    Studies/Results: US Biopsy  Result Date: 12/14/2016 INDICATION: Hypertension, renal failure, vasculitis EXAM: ULTRASOUND GUIDED CORE BIOPSY OF LEFT KIDNEY MEDICATIONS: 1% LIDOCAINE LOCALLY ANESTHESIA/SEDATION: Versed 1.0mg  IV; Fentanyl 7mcg IV; Moderate Sedation Time:  10 MINUTES The patient was continuously monitored during the procedure by the interventional radiology nurse under my direct supervision. FLUOROSCOPY TIME:  Fluoroscopy Time: NONE. COMPLICATIONS: None immediate. PROCEDURE: The procedure, risks, benefits, and alternatives were explained to the patient. Questions regarding the procedure were encouraged and answered.  The patient understands and consents to the procedure. Patient positioned prone. Preliminary ultrasound performed. Left kidney lower pole was localized. Overlying skin marked. The left posterior flank was prepped with ChloraPrep in a sterile fashion, and a sterile drape was applied covering the operative field. A sterile gown and sterile gloves were used for the procedure. Local anesthesia was provided with 1% Lidocaine. Under sterile conditions and local anesthesia, a 16 gauge access needle was advanced to the left kidney lower pole under direct ultrasound. Through the access, 2 16 gauge core biopsies obtained of the lower pole cortex. Samples placed in saline. These were intact and non fragmented. Postprocedure imaging demonstrates no hemorrhage or hematoma. Patient tolerated the biopsy well. FINDINGS: Imaging confirms needle placement to the left kidney lower pole for core biopsy IMPRESSION: Successful ultrasound left kidney biopsy Electronically Signed   By: Jerilynn Mages.  Shick M.D.   On: 12/14/2016 10:31    I have reviewed the patient's current medications.  Assessment/Plan: 1 AKI/CKD 4  Not clear what course will be or baseline Will need outpatient f/u 2 Anca dz primary f/u Pulm 3 Anemia will need to be addressed outpatient 4 HPTH no  Value yet 5 HTN controlled P can d/c from our standpoint,primary f/u Pulm.  F/U with Dr. Moshe Cipro in 2-3 wk   LOS: 7 days   Analycia Khokhar L 12/15/2016,10:03 AM

## 2016-12-15 NOTE — Progress Notes (Signed)
CBG 434, administered 20 Units novolog sQ, Notify on call.Sent text to MD on call. Awaiting call back.

## 2016-12-16 LAB — RENAL FUNCTION PANEL
ANION GAP: 11 (ref 5–15)
Albumin: 2.4 g/dL — ABNORMAL LOW (ref 3.5–5.0)
BUN: 101 mg/dL — ABNORMAL HIGH (ref 6–20)
CALCIUM: 9.1 mg/dL (ref 8.9–10.3)
CO2: 21 mmol/L — AB (ref 22–32)
Chloride: 105 mmol/L (ref 101–111)
Creatinine, Ser: 4.13 mg/dL — ABNORMAL HIGH (ref 0.44–1.00)
GFR calc non Af Amer: 11 mL/min — ABNORMAL LOW (ref >60)
GFR, EST AFRICAN AMERICAN: 12 mL/min — AB (ref >60)
GLUCOSE: 89 mg/dL (ref 65–99)
PHOSPHORUS: 5.2 mg/dL — AB (ref 2.5–4.6)
POTASSIUM: 3.7 mmol/L (ref 3.5–5.1)
SODIUM: 137 mmol/L (ref 135–145)

## 2016-12-16 LAB — GLUCOSE, CAPILLARY
GLUCOSE-CAPILLARY: 88 mg/dL (ref 65–99)
GLUCOSE-CAPILLARY: 127 mg/dL — AB (ref 65–99)
GLUCOSE-CAPILLARY: 384 mg/dL — AB (ref 65–99)
Glucose-Capillary: 85 mg/dL (ref 65–99)

## 2016-12-16 LAB — CBC
HEMATOCRIT: 30.4 % — AB (ref 36.0–46.0)
HEMOGLOBIN: 9.6 g/dL — AB (ref 12.0–15.0)
MCH: 24.4 pg — AB (ref 26.0–34.0)
MCHC: 31.6 g/dL (ref 30.0–36.0)
MCV: 77.2 fL — ABNORMAL LOW (ref 78.0–100.0)
Platelets: 597 10*3/uL — ABNORMAL HIGH (ref 150–400)
RBC: 3.94 MIL/uL (ref 3.87–5.11)
RDW: 18 % — ABNORMAL HIGH (ref 11.5–15.5)
WBC: 20.1 10*3/uL — ABNORMAL HIGH (ref 4.0–10.5)

## 2016-12-16 MED ORDER — LIVING WELL WITH DIABETES BOOK
Freq: Once | Status: AC
  Administered 2016-12-16: 19:00:00
  Filled 2016-12-16: qty 1

## 2016-12-16 MED ORDER — SULFAMETHOXAZOLE-TRIMETHOPRIM 800-160 MG PO TABS: 1.0000 | ORAL_TABLET | ORAL | 0 refills | Status: DC

## 2016-12-16 MED ORDER — NICOTINE 21 MG/24HR TD PT24: 21.0000 mg | MEDICATED_PATCH | Freq: Every day | TRANSDERMAL | 0 refills | Status: DC

## 2016-12-16 MED ORDER — HYDRALAZINE HCL 25 MG PO TABS: 75.0000 mg | ORAL_TABLET | Freq: Three times a day (TID) | ORAL | 0 refills | Status: DC

## 2016-12-16 MED ORDER — POLYETHYLENE GLYCOL 3350 17 G PO PACK: 17.0000 g | PACK | Freq: Every day | ORAL | 0 refills | Status: DC

## 2016-12-16 MED ORDER — BLOOD GLUCOSE MONITOR KIT: PACK | 0 refills | Status: DC

## 2016-12-16 MED ORDER — FUROSEMIDE 80 MG PO TABS: 80.0000 mg | ORAL_TABLET | Freq: Every day | ORAL | 0 refills | Status: DC

## 2016-12-16 MED ORDER — INSULIN ASPART 100 UNIT/ML FLEXPEN: PEN_INJECTOR | SUBCUTANEOUS | 0 refills | Status: DC

## 2016-12-16 MED ORDER — PREDNISONE 50 MG PO TABS: 60.0000 mg | ORAL_TABLET | Freq: Every day | ORAL | Status: DC

## 2016-12-16 MED ORDER — PREDNISONE 20 MG PO TABS: 60.0000 mg | ORAL_TABLET | Freq: Every day | ORAL | 0 refills | Status: DC

## 2016-12-16 NOTE — Discharge Summary (Addendum)
Physician Discharge Summary  Catherine Aguilar SJG:[id number] DOB: 1956/04/03 DOA: 12/07/2016  PCP: Maggie Font, MD  Admit date: 12/07/2016 Discharge date: 12/16/2016  Admitted From: Home Disposition: Home  Recommendations for Outpatient Follow-up:  1. Follow up with PCP in 1 week 2. Follow up with Nephrology in 2 weeks 3. Follow up with pulmonology in 1 week 4. Please obtain BMP/CBC in one week 5. Please follow up on the following pending results: Kidney biopsy  Home Health: Physical therapy Equipment/Devices: 3 in 1 and Walking Roller  Discharge Condition: Stable CODE STATUS: Full codeC Diet recommendation: Carb modified   Brief/Interim Summary:  Admission HPI written by Ivor Costa, MD   Chief Complaint: SOB, hemoptysis and chest pain, fever, chills  HPI: Catherine Aguilar is a 61 y.o. female with medical history significant of hypertension, hyperlipidemia, stroke, tobacco abuse, marijuana abuse, chronic bronchitis, who presents with shortness breath, hemoptysis and chest pain.  Patient states that he has been having cough, shortness breath, intermittent hemoptysis for almost one month. She reports that she coughs up yellow colored sputum. Her chest pain is located in the mid of the chest, constant, 10 out of 10 in severity, nonradiating. It is aggravated by deep breath and coughing. She can barely speak in full sentence. She states that she coughs up bright colored blood intermittently, each time about a teaspoon in amount. Patient has dizziness and generalized weakness, but no unilateral weakness or numbness in extremities. She also has a fever and chills. She denies nausea, vomiting, diarrhea, abdominal pain, symptoms of UTI. She states that she's abdomen is distended, but not hurting.  ED Course: pt was found to have hemoglobin dropped from 11.3 on 06/05/16-->6.1 today, WBC 10.8, acute renal injury with creatinine 4.41, temperature 99.8, tachycardia, tachypnea, oxygen saturation  86% on room air. Chest x-ray showed right sided pneumonia.   # CT of the chest without contrast showed 1. dense right-sided airspace opacification is compatible with pneumonia, mildly worsened from the recent prior chest radiograph. Mild hazy opacity at the left lung apex is also concerning for pneumonia. 2. 1.3 cm right paratracheal node may reflect the underlying infection. Pt is admitted to SDU as inpt. PCCM, Dr. Ancil Linsey was consulted.    Hospital course:  Systemic vasculitis syndrome Involvement of lungs, sinuses and kidneys. Patient was started on high dose steroids and rituximab. Labs: ANCA positive, myeloperoxidase ab positive. Skin biopsy of rash on back obtained and was significant for no evidence of vasculitis. Kidney biopsy obtained and is pending. Nephrology consulted for acute kidney injury. Prednisone tapered down and patient given aranesp for anemia. Patient received two rounds of rituximab. Bactrim started for prophylaxis.  Acute hypoxic respiratory failure secondary to alveolar hemorrhage Initial concern for sepsis. Patient started on broad spectrum antibiotics of vancomycin and zosyn, in addition to IV fluids. Critical care was consulted. Patient not requiring intubation. Oxygen requiriments improved with treatment. Patient required 2 units of PRBC. Hemoptysis resolved before discharge.  History of substance abuse Counseled.  Essential hypertension Continued hydralazine and held losartan in setting of acute kidney injury.  History of CVA Continued statin. Aspirin held secondary to alveolar hemorrhage. Can consider restarting as an outpatient.  Alcohol abuse CIWA protocol at admission. No withdrawal.  Acute kidney injury Secondary to vasculitis. Nephrology consulted and started furosemide. Outpatient follow-up with Dr. Moshe Cipro.  Hypokalemia Given supplementation.   Discharge Diagnoses:  Principal Problem:   Systemic vasculitis syndrome ([place]) Active  Problems:   CVA (cerebral vascular accident) [facility])   Essential hypertension  HLD (hyperlipidemia)   Tobacco abuse   AKI (acute kidney injury) ([place])   Sepsis ([place])   Hemoptysis   CAP (community acquired pneumonia)   Symptomatic anemia   Acute respiratory failure with hypoxia ([facility])   Wegener's granulomatosis with renal involvement ([place])   Intra-alveolar hemorrhage   Acute renal failure (ARF) ([facility])   Cutaneous vasculitis   Substance abuse   Goals of care, counseling/discussion   Diffuse pulmonary alveolar hemorrhage    Discharge Instructions  Discharge Instructions    Call MD for:  difficulty breathing, headache or visual disturbances    Complete by:  As directed    Call MD for:  extreme fatigue    Complete by:  As directed    Call MD for:  persistant dizziness or light-headedness    Complete by:  As directed    Call MD for:  redness, tenderness, or signs of infection (pain, swelling, redness, odor or green/yellow discharge around incision site)    Complete by:  As directed    Call MD for:  severe uncontrolled pain    Complete by:  As directed      Allergies as of 12/16/2016   No Known Allergies     Medication List    STOP taking these medications   aspirin 325 MG tablet   azithromycin 250 MG tablet Commonly known as:  ZITHROMAX   gabapentin 300 MG capsule Commonly known as:  NEURONTIN   indomethacin 25 MG capsule Commonly known as:  INDOCIN   lisinopril 10 MG tablet Commonly known as:  PRINIVIL   losartan 100 MG tablet Commonly known as:  COZAAR   mupirocin nasal ointment 2 % Commonly known as:  BACTROBAN     TAKE these medications   blood glucose meter kit and supplies Kit Dispense based on patient and insurance preference. Use up to four times daily as directed. (FOR ICD-9 250.00, 250.01).   fluticasone 50 MCG/ACT nasal spray Commonly known as:  FLONASE Place 1 spray into the nose daily as needed for congestion. What changed:  Another medication  with the same name was removed. Continue taking this medication, and follow the directions you see here.   furosemide 80 MG tablet Commonly known as:  LASIX Take 1 tablet (80 mg total) by mouth daily. Start taking on:  12/17/2016   hydrALAZINE 25 MG tablet Commonly known as:  APRESOLINE Take 3 tablets (75 mg total) by mouth 3 (three) times daily. What changed:  how much to take  Another medication with the same name was removed. Continue taking this medication, and follow the directions you see here.   insulin aspart 100 UNIT/ML FlexPen Commonly known as:  NOVOLOG Check your blood sugar three times daily before meals and use the following sliding scale depending on your blood sugar.  CBG 70 - 120: 0 units  CBG 121 - 150: 1 unit  CBG 151 - 200: 2 units  CBG 201 - 250: 3 units  CBG 251 - 300: 5 units  CBG 301 - 350: 7 units  CBG 351 - 400: 9 units   loratadine 10 MG tablet Commonly known as:  CLARITIN TAKE ONE TABLET BY MOUTH EVERY DAY FOR NASAL CONGESTION   metFORMIN 500 MG tablet Commonly known as:  GLUCOPHAGE TAKE 500 MG BY MOUTH AT BREAKFAST AND SUPPER   nicotine 21 mg/24hr patch Commonly known as:  NICODERM CQ - dosed in mg/24 hours Place 1 patch (21 mg total) onto the skin daily. Start taking on:  12/17/2016   polyethylene glycol packet Commonly known as:  MIRALAX / GLYCOLAX Take 17 g by mouth daily. Start taking on:  12/17/2016   pravastatin 40 MG tablet Commonly known as:  PRAVACHOL Take 1 tablet (40 mg total) by mouth daily. For elevated cholesterol   predniSONE 20 MG tablet Commonly known as:  DELTASONE Take 3 tablets (60 mg total) by mouth daily with breakfast. Start taking on:  12/17/2016   PROVENTIL HFA 108 (90 Base) MCG/ACT inhaler Generic drug:  albuterol INHALE 2 PUFFS EVERY 4-6 HOURS PRN FOR SOB AND WHEEZING   sodium chloride 0.65 % Soln nasal spray Commonly known as:  OCEAN Place 1 spray into both nostrils 3 (three) times daily.    sulfamethoxazole-trimethoprim 800-160 MG tablet Commonly known as:  BACTRIM DS,SEPTRA DS Take 1 tablet by mouth 3 (three) times a week. Start taking on:  12/17/2016            Durable Medical Equipment        Start     Ordered   12/13/16 1833  For home use only DME Walker rolling  Once    Question:  Patient needs a walker to treat with the following condition  Answer:  Vasculitis ([place])   12/13/16 1833   12/13/16 1833  For home use only DME 3 n 1  Once     12/13/16 1833     Follow-up [place], MD. Schedule an appointment as soon as possible for a visit in 1 week(s).   Specialty:  Family Medicine Contact information: [address] [place] [address] [phone]        [facility]. Schedule an appointment as soon as possible for a visit in 1 week(s).   Specialty:  Pulmonology Why:  Lung disease/steroids/Rituximab Contact information: [place], [place]. Schedule an appointment as soon as possible for a visit in 2 week(s).   Specialty:  Nephrology Why:  Kidney failure Contact information: [place] [address] [phone]          No Known Allergies  Consultations:  Pulmonology  Nephrology   Procedures/Studies: Ct Abdomen Pelvis Wo Contrast  Result Date: 12/08/2016 CLINICAL DATA:  Abdominal distention. EXAM: CT ABDOMEN AND PELVIS WITHOUT CONTRAST TECHNIQUE: Multidetector CT imaging of the abdomen and pelvis was performed following the standard protocol without IV contrast. COMPARISON:  Chest CT earlier this day. FINDINGS: Breathing motion artifact in the upper abdomen. Lower chest: Motion through the lower chest. Right-sided pulmonary opacity is partially included. The heart is enlarged. Hepatobiliary: No focal lesion allowing for lack contrast. Appears prominent size. Gallbladder physiologically distended, no calcified stone.  No biliary dilatation. Pancreas: No ductal dilatation or gross peripancreatic inflammation, motion limited evaluation. Spleen: Normal in size without focal abnormality. Adrenals/Urinary Tract: No adrenal nodule. No hydronephrosis. Right kidney slightly ptopic. Detailed evaluation limited by motion. Probable cyst in the lower right kidney. Urinary bladder is physiologically distended. Stomach/Bowel: Motion artifact limits assessment. Stomach is decompressed. No small bowel dilatation to suggest obstruction. Moderate stool burden throughout the ascending and transverse colon. Tortuous sigmoid colon that is partially obscured. Normal appendix. Questionable edema in the lower mesentery. No evidence of associated bowel inflammation. Vascular/Lymphatic: Abdominal aortic atherosclerosis. No aneurysm. Retroperitoneal phleboliths. Small mesenteric nodes not enlarged by size criteria. Reproductive: Status post hysterectomy. No adnexal masses. Other: No ascites or free air. Small fat containing  umbilical hernia. Musculoskeletal: There are no acute or suspicious osseous abnormalities. Degenerative change throughout spine and both sacroiliac joints. IMPRESSION: 1. No definite acute abnormality in abdomen/pelvis. Moderate stool burden in the proximal colon can be seen with constipation. 2. Equivocal lower mesenteric edema without associated bowel abnormality. This is likely systemic etiology and can be seen with third-spacing. 3. Aortic atherosclerosis. Electronically Signed   By: Jeb Levering M.D.   On: 12/08/2016 03:47   Dg Chest 2 View  Result Date: 12/07/2016 CLINICAL DATA:  Chronic shortness of breath and productive cough. Initial encounter. EXAM: CHEST  2 VIEW COMPARISON:  Chest radiograph performed 06/05/2016, and CT of the chest performed 07/02/2016 FINDINGS: The lungs are well-aerated. Dense right-sided airspace opacity raises concern for pneumonia. There is no evidence of pleural effusion or pneumothorax. The  heart is mildly enlarged. No acute osseous abnormalities are seen. IMPRESSION: 1. Dense right-sided pneumonia. Followup PA and lateral chest X-ray is recommended in 3-4 weeks following trial of antibiotic therapy to ensure resolution and exclude underlying malignancy. 2. Mild cardiomegaly. Electronically Signed   By: Garald Balding M.D.   On: 12/07/2016 21:44   Ct Head Wo Contrast  Result Date: 12/09/2016 CLINICAL DATA:  Headache.  Vasculitis. EXAM: CT HEAD WITHOUT CONTRAST CT MAXILLOFACIAL WITHOUT CONTRAST TECHNIQUE: Multidetector CT imaging of the head and maxillofacial structures were performed using the standard protocol without intravenous contrast. Multiplanar CT image reconstructions of the maxillofacial structures were also generated. COMPARISON:  Head CT and brain MRI October 2013 FINDINGS: CT HEAD FINDINGS Brain: No evidence of acute infarction, hemorrhage, hydrocephalus, extra-axial collection or mass lesion/mass effect. Vascular: Atherosclerosis of skullbase vasculature without hyperdense vessel or abnormal calcification. Skull: Normal. Negative for fracture or focal lesion. Mastoid air cells are well-aerated. Other: None. CT MAXILLOFACIAL FINDINGS Osseous: No fracture or bony destructive change. There are multiple missing teeth. No periapical lucencies. Degenerative change of both temporomandibular joints, imaging obtained with open mouth. Orbits: Negative. No traumatic or inflammatory finding. Sinuses: Clear.  No mucosal thickening or fluid levels. Soft tissues: Negative. IMPRESSION: 1.  No acute intracranial abnormality. 2. No acute facial bone abnormality. Mild chronic change at the temporomandibular joints. Multiple absent teeth, chronic. Electronically Signed   By: Jeb Levering M.D.   On: 12/09/2016 01:15   Ct Chest Wo Contrast  Result Date: 12/08/2016 CLINICAL DATA:  Acute onset of productive cough and generalized weakness. Rhinorrhea, congestion and shortness of breath. Chills. Initial  encounter. EXAM: CT CHEST WITHOUT CONTRAST TECHNIQUE: Multidetector CT imaging of the chest was performed following the standard protocol without IV contrast. COMPARISON:  Chest radiograph performed 12/07/2016, and CT of the chest performed 07/02/2016 FINDINGS: Cardiovascular: The heart is mildly enlarged. Mild coronary artery calcification is seen. Mild calcification is seen along the aortic arch. The great vessels are grossly unremarkable in appearance. Mediastinum/Nodes: A mildly enlarged 1.3 cm right paratracheal node is seen. No definite additional mediastinal lymphadenopathy is seen. Trace pericardial fluid remains within normal limits. The thyroid gland is unremarkable. No axillary lymphadenopathy is seen. Lungs/Pleura: Dense right-sided airspace opacification is compatible with pneumonia, mildly worsened from the recent prior chest radiograph. Mild hazy opacity is also seen at the left lung apex, new from prior CT and concerning for pneumonia. No pleural effusion or pneumothorax is seen. No dominant mass is identified. Previously noted upper lobe nodular opacities are obscured by the patient's pneumonia. Upper Abdomen: The visualized portions of the liver and the spleen are unremarkable in appearance. The visualized portions of the pancreas, adrenal  glands and kidneys are within normal limits. Musculoskeletal: No acute osseous abnormalities are identified. The visualized musculature is unremarkable in appearance. IMPRESSION: 1. Dense right-sided airspace opacification is compatible with pneumonia, mildly worsened from the recent prior chest radiograph. Mild hazy opacity at the left lung apex is also concerning for pneumonia. 2. 1.3 cm right paratracheal node may reflect the underlying infection. 3. Mild cardiomegaly.  Mild coronary artery calcifications seen. Electronically Signed   By: Garald Balding M.D.   On: 12/08/2016 00:58   US Renal  Result Date: 12/08/2016 CLINICAL DATA:  Acute kidney injury.  History of hypertension and hypercholesterolemia. EXAM: RENAL / URINARY TRACT ULTRASOUND COMPLETE COMPARISON:  None. FINDINGS: Right Kidney: Length: 10.4 cm. Echogenic parenchyma. No mass or hydronephrosis visualized. Left Kidney: Length: 14.4 cm. Echogenic parenchyma. No mass or hydronephrosis visualized. Bladder: Appears normal for degree of bladder distention. IMPRESSION: Echogenic kidneys consistent with medical renal disease. RIGHT kidney measures smaller than the LEFT which may be technical or, reflect atrophy. Electronically Signed   By: Elon Alas M.D.   On: 12/08/2016 01:39   US Biopsy  Result Date: 12/14/2016 INDICATION: Hypertension, renal failure, vasculitis EXAM: ULTRASOUND GUIDED CORE BIOPSY OF LEFT KIDNEY MEDICATIONS: 1% LIDOCAINE LOCALLY ANESTHESIA/SEDATION: Versed 1.31m IV; Fentanyl 215m IV; Moderate Sedation Time:  10 MINUTES The patient was continuously monitored during the procedure by the interventional radiology nurse under my direct supervision. FLUOROSCOPY TIME:  Fluoroscopy Time: NONE. COMPLICATIONS: None immediate. PROCEDURE: The procedure, risks, benefits, and alternatives were explained to the patient. Questions regarding the procedure were encouraged and answered. The patient understands and consents to the procedure. Patient positioned prone. Preliminary ultrasound performed. Left kidney lower pole was localized. Overlying skin marked. The left posterior flank was prepped with ChloraPrep in a sterile fashion, and a sterile drape was applied covering the operative field. A sterile gown and sterile gloves were used for the procedure. Local anesthesia was provided with 1% Lidocaine. Under sterile conditions and local anesthesia, a 16 gauge access needle was advanced to the left kidney lower pole under direct ultrasound. Through the access, 2 16 gauge core biopsies obtained of the lower pole cortex. Samples placed in saline. These were intact and non fragmented. Postprocedure  imaging demonstrates no hemorrhage or hematoma. Patient tolerated the biopsy well. FINDINGS: Imaging confirms needle placement to the left kidney lower pole for core biopsy IMPRESSION: Successful ultrasound left kidney biopsy Electronically Signed   By: M.Jerilynn Mages Shick M.D.   On: 12/14/2016 10:31   Dg Chest Port 1 View  Result Date: 12/12/2016 CLINICAL DATA:  Respiratory failure. EXAM: PORTABLE CHEST 1 VIEW COMPARISON:  12/09/2016.  CT 12/08/2016. FINDINGS: Persistent diffuse right lung infiltrate particularly prominent in the right upper lobe. Findings have improved slightly from prior exam. Mild left mid lung field subsegmental atelectasis. No pleural effusion or pneumothorax. Stable cardiomegaly. No pulmonary venous congestion . IMPRESSION: Persistent diffuse right lung infiltrate, particularly prominent in the right upper lobe. Findings have improved slightly from prior exam. Electronically Signed   By: [place] On: 12/12/2016 07:35   Dg Chest Port 1 View  Result Date: 12/09/2016 CLINICAL DATA:  Followup for diffuse pulmonary hemorrhage. EXAM: PORTABLE CHEST 1 VIEW COMPARISON:  Chest CT, 12/08/2016.  Chest radiograph, 12/07/2016. FINDINGS: There is extensive consolidation in the right upper lobe was less dense consolidation in the right lower lobe. A smaller area of consolidation is noted in the left upper lobe. Findings are stable from the previous day's CT scan. Right lower lobe  opacity has mildly increased when compared the prior chest radiograph. No convincing pleural effusion.  No pneumothorax. Heart is mildly enlarged. IMPRESSION: 1. Extensive areas of lung consolidation, mostly on the right, stable from the previous day's chest CT scan. Electronically Signed   By: Lajean Manes M.D.   On: 12/09/2016 11:03   Ct Maxillofacial Wo Contrast  Result Date: 12/09/2016 CLINICAL DATA:  Headache.  Vasculitis. EXAM: CT HEAD WITHOUT CONTRAST CT MAXILLOFACIAL WITHOUT CONTRAST TECHNIQUE: Multidetector CT  imaging of the head and maxillofacial structures were performed using the standard protocol without intravenous contrast. Multiplanar CT image reconstructions of the maxillofacial structures were also generated. COMPARISON:  Head CT and brain MRI October 2013 FINDINGS: CT HEAD FINDINGS Brain: No evidence of acute infarction, hemorrhage, hydrocephalus, extra-axial collection or mass lesion/mass effect. Vascular: Atherosclerosis of skullbase vasculature without hyperdense vessel or abnormal calcification. Skull: Normal. Negative for fracture or focal lesion. Mastoid air cells are well-aerated. Other: None. CT MAXILLOFACIAL FINDINGS Osseous: No fracture or bony destructive change. There are multiple missing teeth. No periapical lucencies. Degenerative change of both temporomandibular joints, imaging obtained with open mouth. Orbits: Negative. No traumatic or inflammatory finding. Sinuses: Clear.  No mucosal thickening or fluid levels. Soft tissues: Negative. IMPRESSION: 1.  No acute intracranial abnormality. 2. No acute facial bone abnormality. Mild chronic change at the temporomandibular joints. Multiple absent teeth, chronic. Electronically Signed   By: Jeb Levering M.D.   On: 12/09/2016 01:15      Subjective: No chest pain, coughing, hemoptysis or dyspnea.  Discharge Exam: Vitals:   12/16/16 0531 12/16/16 1549  BP: (!) 148/82 (!) 130/58  Pulse: 69 88  Resp: 19 18  Temp: 98.2 F (36.8 C) 98.4 F (36.9 C)   Vitals:   12/15/16 1352 12/15/16 2125 12/16/16 0531 12/16/16 1549  BP: 110/64 129/74 (!) 148/82 (!) 130/58  Pulse: 92 88 69 88  Resp: _0 Temp: 98.6 F (37 C) 98.4 F (36.9 C) 98.2 F (36.8 C) 98.4 F (36.9 C)  TempSrc: Oral     SpO2: 93% 97% 98% 97%  Weight:      Height:        General exam: Appears calm and comfortable. Respiratory system: Clear to auscultation. Respiratory effort normal. Cardiovascular system: S1 & S2 heard, RRR. No murmurs Gastrointestinal  system: Abdomen is nondistended, soft and nontender. Normal bowel sounds heard. Central nervous system: Alert and oriented. No focal neurological deficits. Extremities: No edema. No calf tenderness Skin: No cyanosis. Macular, dark lesions on upper back.                      Psychiatry: Judgement and insight appear normal. Mood & affect appropriate.   The results of significant diagnostics from this hospitalization (including imaging, microbiology, ancillary and laboratory) are listed below for reference.     Microbiology: Recent Results (from the past 240 hour(s))  Culture, blood (routine x 2) Call MD if unable to obtain prior to antibiotics being given     Status: None   Collection Time: 12/07/16 11:10 PM  Result Value Ref Range Status   Specimen Description BLOOD BLOOD RIGHT FOREARM  Final   Special Requests BOTTLES DRAWN AEROBIC AND ANAEROBIC 5ML  Final   Culture   Final    NO GROWTH 5 DAYS Performed at [facility], [address] [place], [place] [address]    Report Status 12/13/2016 FINAL  Final  Culture, blood (routine x 2) Call MD  if unable to obtain prior to antibiotics being given     Status: None   Collection Time: 12/07/16 11:12 PM  Result Value Ref Range Status   Specimen Description BLOOD LEFT HAND  Final   Special Requests BOTTLES DRAWN AEROBIC ONLY 6ML  Final   Culture   Final    NO GROWTH 5 DAYS Performed at [facility], [address] [place], [place] [address]    Report Status 12/13/2016 FINAL  Final  Culture, sputum-assessment     Status: None   Collection Time: 12/08/16  1:08 AM  Result Value Ref Range Status   Specimen Description EXPECTORATED SPUTUM  Final   Special Requests NONE  Final   Sputum evaluation   Final    THIS SPECIMEN IS ACCEPTABLE FOR SPUTUM CULTURE Performed at [facility], [place] [address] [place], [place] [address]    Report Status 12/08/2016 FINAL  Final  Culture, respiratory (NON-Expectorated)     Status: None    Collection Time: 12/08/16  1:08 AM  Result Value Ref Range Status   Specimen Description EXPECTORATED SPUTUM  Final   Special Requests NONE Reflexed from [id number]  Final   Gram Stain   Final    FEW SQUAMOUS EPITHELIAL CELLS PRESENT FEW WBC PRESENT, PREDOMINANTLY MONONUCLEAR FEW GRAM POSITIVE RODS FEW GRAM POSITIVE COCCI IN PAIRS FEW GRAM NEGATIVE RODS    Culture   Final    Consistent with normal respiratory flora. Performed at [facility], [place] [address], [place], [place] [address]    Report Status 12/10/2016 FINAL  Final  MRSA PCR Screening     Status: None   Collection Time: 12/08/16  4:22 AM  Result Value Ref Range Status   MRSA by PCR NEGATIVE NEGATIVE Final    Comment:        The GeneXpert MRSA Assay (FDA approved for NASAL specimens only), is one component of a comprehensive MRSA colonization surveillance program. It is not intended to diagnose MRSA infection nor to guide or monitor treatment for MRSA infections.   Respiratory Panel by PCR     Status: None   Collection Time: 12/08/16  8:30 AM  Result Value Ref Range Status   Adenovirus NOT DETECTED NOT DETECTED Final   Coronavirus 229E NOT DETECTED NOT DETECTED Final   Coronavirus HKU1 NOT DETECTED NOT DETECTED Final   Coronavirus NL63 NOT DETECTED NOT DETECTED Final   Coronavirus OC43 NOT DETECTED NOT DETECTED Final   Metapneumovirus NOT DETECTED NOT DETECTED Final   Rhinovirus / Enterovirus NOT DETECTED NOT DETECTED Final   Influenza A NOT DETECTED NOT DETECTED Final   Influenza B NOT DETECTED NOT DETECTED Final   Parainfluenza Virus 1 NOT DETECTED NOT DETECTED Final   Parainfluenza Virus 2 NOT DETECTED NOT DETECTED Final   Parainfluenza Virus 3 NOT DETECTED NOT DETECTED Final   Parainfluenza Virus 4 NOT DETECTED NOT DETECTED Final   Respiratory Syncytial Virus NOT DETECTED NOT DETECTED Final   Bordetella pertussis NOT DETECTED NOT DETECTED Final   Chlamydophila pneumoniae NOT DETECTED NOT DETECTED Final    Mycoplasma pneumoniae NOT DETECTED NOT DETECTED Final     Labs: BNP (last 3 results) No results for input(s): BNP in the last 8760 hours. Basic Metabolic Panel:  Recent Labs Lab 12/10/16 0248 12/11/16 0148 12/12/16 4076 12/13/16 0522 12/14/16 0455 12/15/16 0501 12/16/16 0445  NA 138  --  138 137 138 135 137  K 3.8  --  2.8* 2.8* 2.9* 3.7 3.7  CL 107  --  102 102 102 102 105  CO2 15*  --  19* 20* 20* 20* 21*  GLUCOSE 188*  --  194* 142* 86 98 89  BUN 85*  --  108* 103* 106* 98* 101*  CREATININE 4.49* 4.52* 4.11* 4.02* 4.39* 4.12* 4.13*  CALCIUM 8.9  --  8.6* 8.7* 9.1 9.4 9.1  MG 2.0 2.0 2.1 2.1  --   --   --   PHOS 6.5* 6.2* 7.8* 7.3*  --  5.8* 5.2*   Liver Function Tests:  Recent Labs Lab 12/10/16 0248 12/12/16 0823 12/15/16 0501 12/16/16 0445  AST 15 16  --   --   ALT 23 18  --   --   ALKPHOS 64 51  --   --   BILITOT 0.7 0.7  --   --   PROT 7.3 7.4  --   --   ALBUMIN 2.8* 2.9* 2.7* 2.4*   No results for input(s): LIPASE, AMYLASE in the last 168 hours. No results for input(s): AMMONIA in the last 168 hours. CBC:  Recent Labs Lab 12/12/16 0823 12/13/16 0522 12/14/16 0455 12/15/16 0501 12/16/16 0445  WBC 14.0* 14.7* 20.5* 15.6* 20.1*  HGB 9.2* 8.9* 9.7* 9.6* 9.6*  HCT 28.3* 26.4* 28.7* 29.4* 30.4*  MCV 75.1* 74.2* 74.7* 75.6* 77.2*  PLT 633* 577* 658* 623* 597*   Cardiac Enzymes: No results for input(s): CKTOTAL, CKMB, CKMBINDEX, TROPONINI in the last 168 hours. BNP: Invalid input(s): POCBNP CBG:  Recent Labs Lab 12/15/16 2123 12/16/16 0335 12/16/16 0841 12/16/16 1233 12/16/16 1722  GLUCAP 239* 85 88 127* 384*   D-Dimer No results for input(s): DDIMER in the last 72 hours. Hgb A1c No results for input(s): HGBA1C in the last 72 hours. Lipid Profile No results for input(s): CHOL, HDL, LDLCALC, TRIG, CHOLHDL, LDLDIRECT in the last 72 hours. Thyroid function studies No results for input(s): TSH, T4TOTAL, T3FREE, THYROIDAB in the last 72  hours.  Invalid input(s): FREET3 Anemia work up  Recent Labs  12/14/16 0455  TIBC 244*  IRON 51   Urinalysis    Component Value Date/Time   COLORURINE YELLOW 12/08/2016 0254   APPEARANCEUR CLOUDY (A) 12/08/2016 0254   LABSPEC 1.010 12/08/2016 0254   PHURINE 5.0 12/08/2016 0254   GLUCOSEU NEGATIVE 12/08/2016 0254   HGBUR LARGE (A) 12/08/2016 0254   BILIRUBINUR NEGATIVE 12/08/2016 0254   KETONESUR NEGATIVE 12/08/2016 0254   PROTEINUR 100 (A) 12/08/2016 0254   NITRITE NEGATIVE 12/08/2016 0254   LEUKOCYTESUR NEGATIVE 12/08/2016 0254   Sepsis Labs Invalid input(s): PROCALCITONIN,  WBC,  LACTICIDVEN Microbiology Recent Results (from the past 240 hour(s))  Culture, blood (routine x 2) Call MD if unable to obtain prior to antibiotics being given     Status: None   Collection Time: 12/07/16 11:10 PM  Result Value Ref Range Status   Specimen Description BLOOD BLOOD RIGHT FOREARM  Final   Special Requests BOTTLES DRAWN AEROBIC AND ANAEROBIC 5ML  Final   Culture   Final    NO GROWTH 5 DAYS Performed at [facility], [place] [address] [place], [place] [address]    Report Status 12/13/2016 FINAL  Final  Culture, blood (routine x 2) Call MD if unable to obtain prior to antibiotics being given     Status: None   Collection Time: 12/07/16 11:12 PM  Result Value Ref Range Status   Specimen Description BLOOD LEFT HAND  Final   Special Requests BOTTLES DRAWN AEROBIC ONLY 6ML  Final   Culture   Final    NO GROWTH 5 DAYS Performed at [facility], [place] [address] [place], [place] [address]    Report Status 12/13/2016 FINAL  Final  Culture, sputum-assessment     Status: None   Collection Time: 12/08/16  1:08 AM  Result Value Ref Range Status   Specimen Description EXPECTORATED SPUTUM  Final   Special Requests NONE  Final   Sputum evaluation   Final    THIS SPECIMEN IS ACCEPTABLE FOR SPUTUM CULTURE Performed at [facility], [place] [address], [place], [place]  [address]    Report Status 12/08/2016 FINAL  Final  Culture, respiratory (NON-Expectorated)     Status: None   Collection Time: 12/08/16  1:08 AM  Result Value Ref Range Status   Specimen Description EXPECTORATED SPUTUM  Final   Special Requests NONE Reflexed from [id number]  Final   Gram Stain   Final    FEW SQUAMOUS EPITHELIAL CELLS PRESENT FEW WBC PRESENT, PREDOMINANTLY MONONUCLEAR FEW GRAM POSITIVE RODS FEW GRAM POSITIVE COCCI IN PAIRS FEW GRAM NEGATIVE RODS    Culture   Final    Consistent with normal respiratory flora. Performed at [facility], [place] [address], [place], [place] [address]    Report Status 12/10/2016 FINAL  Final  MRSA PCR Screening     Status: None   Collection Time: 12/08/16  4:22 AM  Result Value Ref Range Status   MRSA by PCR NEGATIVE NEGATIVE Final    Comment:        The GeneXpert MRSA Assay (FDA approved for NASAL specimens only), is one component of a comprehensive MRSA colonization surveillance program. It is not intended to diagnose MRSA infection nor to guide or monitor treatment for MRSA infections.   Respiratory Panel by PCR     Status: None   Collection Time: 12/08/16  8:30 AM  Result Value Ref Range Status   Adenovirus NOT DETECTED NOT DETECTED Final   Coronavirus 229E NOT DETECTED NOT DETECTED Final   Coronavirus HKU1 NOT DETECTED NOT DETECTED Final   Coronavirus NL63 NOT DETECTED NOT DETECTED Final   Coronavirus OC43 NOT DETECTED NOT DETECTED Final   Metapneumovirus NOT DETECTED NOT DETECTED Final   Rhinovirus / Enterovirus NOT DETECTED NOT DETECTED Final   Influenza A NOT DETECTED NOT DETECTED Final   Influenza B NOT DETECTED NOT DETECTED Final   Parainfluenza Virus 1 NOT DETECTED NOT DETECTED Final   Parainfluenza Virus 2 NOT DETECTED NOT DETECTED Final   Parainfluenza Virus 3 NOT DETECTED NOT DETECTED Final   Parainfluenza Virus 4 NOT DETECTED NOT DETECTED Final   Respiratory Syncytial Virus NOT DETECTED NOT DETECTED Final    Bordetella pertussis NOT DETECTED NOT DETECTED Final   Chlamydophila pneumoniae NOT DETECTED NOT DETECTED Final   Mycoplasma pneumoniae NOT DETECTED NOT DETECTED Final     Time coordinating discharge: Over 30 minutes  SIGNED:   Cordelia Poche, MD [facility] 12/16/2016, 5:57 PM Pager [phone]  If 7PM-7AM, please contact night-coverage [email or web address] Password [email or web address]

## 2016-12-16 NOTE — Progress Notes (Signed)
   [facility]  I discussed case with Dr. Lonny Prude.  Pt is to be discharged today and needs pulm follow up this week.  She was started on Rituximab this admission for Wegeners dse.  Last dose was 4/6 (375 mg/m2 on 4/6 IV).  She gets it weekly.   We need to order Rituximab.  Plan to to d/c pt on prednsione as well, 60 mg/d as well as Bactrim for PCP prophylaxis.   I discussed the case with the pt and the need for follow up.  She agrees to follow up with [facility].   Monica Becton, MD 12/16/2016, 11:12 AM [facility] Pager [phone] After 3 pm or if no answer, call [phone]

## 2016-12-16 NOTE — Discharge Instructions (Addendum)
Granulomatosis With Polyangiitis Granulomatosis with polyangiitis (GPA), also known as Wegener granulomatosis, is a rare disease of the blood vessels. It causes inflammation of small-sized and medium-sized blood vessels in many parts of the body. This inflammation can interfere with the function of organs that are supplied by the blood vessels. Organs that are commonly affected by this condition include the kidneys, lungs, nose, ears, skin, and eyes. The nervous system is also commonly affected. This condition can range from mild to severe, and it can develop gradually or very quickly (acute). In severe cases, the condition can cause serious damage to organs such as the lungs or kidneys. Early treatment can help to prevent damage to the organs that are involved. What are the causes? The cause of this condition is not known. It occurs when a persons defense system (immune system) mistakenly attacks the body's own blood vessels, which causes inflammation and damage. This makes GPA an autoimmune disease. What increases the risk? This condition is more likely to develop in:  People who are older than 62 years of age.  People who are of Caucasian ethnicity. What are the signs or symptoms? Symptoms can vary depending on which part of the body is involved.  General:  Severe tiredness and weakness.  Decreased appetite.  Unintended weight loss.  Fever or sweats.  Joint pain or swelling.  Muscle pain.  Nose:  Nose or face pain.  Runny nose.  Crusts or sores in the nose.  Nosebleeds.  Airway and lungs:  Cough.  Chest pain.  Change in voice.  Wheezing.  Shortness of breath.  Coughing up blood.  Eyes:  Eye pain.  Red eyes.  Vision problems.  Ears:  Hearing problems. Many other parts of the body can be involved. How is this diagnosed? This condition may be diagnosed with:  A physical exam and medical history.  Blood tests.  Urine tests.  Imaging tests, such  as X-rays or a CT scan.  Biopsy. A small tissue sample is taken from the nasal passages, lung, kidney, skin, or other affected areas for examination in a lab. You may be referred to a specialist. This condition can be confused with a number of other rare diseases. How is this treated? Aggressive treatment is important for this condition and can lead to remission. GPA is usually treated with:  Steroid medicines, such as prednisone.  Other medicines that prevent or slow down your immune system from causing further damage to your blood vessels. You may also be given antibiotic medicines to prevent infection and to help prevent the disease from returning. In severe cases, you may be given medicine directly into a vein through an IV tube. A type of blood transfusion (plasmapheresis) may also be done to remove immune system cells that are attacking your blood vessels. If the kidneys are badly damaged, a kidney transplant may be recommended. Because relapses are common, treatments may need to be adjusted. Follow these instructions at home:  Take over-the-counter and prescription medicines only as told by your health care provider.  If you were prescribed an antibiotic medicine, take it as told by your health care provider. Do not stop taking the antibiotic even if you start to feel better.  Follow instructions from your health care provider about testing for blood in your urine.  Take vitamins and supplements as told by your health care provider or dietitian.  Follow instructions from your health care provider about diet.Try to eat regular, healthy meals, even though some of your treatments  might affect your appetite.  Keep all follow-up visits as told by your health care provider. This is important. Contact a health care provider if:  Your symptoms do not improve in the time expected.  You develop any new symptoms or problems.  You have a fever.  You have nausea or diarrhea.  You  develop a rash.  You have a sore throat, white patches in your mouth, or difficulty swallowing.  You have severe fatigue.  You develop an infection. Get help right away if:  You have chest pain.  You feel short of breath.  You feel very light-headed, or you pass out.  You have pain, swelling, or redness anywhere in your legs.  You have blood in your urine.  You have uncontrollable bleeding, such as a nosebleed that will not stop.  You have sudden loss of vision or hearing.  You have decreased urination. This information is not intended to replace advice given to you by your health care provider. Make sure you discuss any questions you have with your health care provider. Document Released: 06/24/2007 Document Revised: 02/02/2016 Document Reviewed: 09/13/2014 Elsevier Interactive Patient Education  2017 [employer].

## 2016-12-16 NOTE — Progress Notes (Signed)
Catherine Aguilar to be D/C'd to home per MD order.  Discussed with the patient and all questions fully answered.  VSS, Skin clean, dry and intact without evidence of skin break down, no evidence of skin tears noted. IV catheter discontinued intact. Site without signs and symptoms of complications. Dressing and pressure applied.  An After Visit Summary was printed and given to the patient. Patient received prescriptions.  D/c education completed with patient/family including follow up instructions, medication list, d/c activities limitations if indicated, with other d/c instructions as indicated by MD - patient able to verbalize understanding, all questions fully answered.   Patient instructed to return to ED, call 911, or call MD for any changes in condition.   Patient escorted via [place], and D/C home via private auto.  [name] 12/16/2016 7:24 PM

## 2016-12-17 ENCOUNTER — Telehealth: Payer: Self-pay | Admitting: Pulmonary Disease

## 2016-12-17 LAB — GLUCOSE, CAPILLARY: GLUCOSE-CAPILLARY: 98 mg/dL (ref 65–99)

## 2016-12-17 NOTE — Telephone Encounter (Signed)
JN  Please advise on AD's message.  We do not have any open slots with a MD, please advise if you would like [place] to double book you or will an NP be ok   Rush Landmark, MD  Marin Roberts, RMA          This pt was seen by Dr. Ashok Cordia and me. She has Wegeners dse. Can you pls make a follow up appointment to be seen by [facility] in 1-2 weeks ? Either dr Ashok Cordia or me or anyone who is free.    Thanks!   J. Shirl Harris, MD  12/16/2016, 8:40 AM  [facility]  Pager [phone]  After 3 pm or if no answer, call [phone]

## 2016-12-18 NOTE — Telephone Encounter (Signed)
Catherine Aguilar/Triage  Following up from before: Pls ensure fu for this patient that I started on rituxan over easter weekend. She has vasculitis . She can see APP first prefer Catherine Aguilar who has seen vasculitis (will need cbc with diff, bmet, cxr, lft, mag. Phos)  and then return to see me after that  I still do not see fu for this patient.   Thanks  Dr. Brand Males, M.D., [facility].C.P Pulmonary and Critical Care Medicine Staff Physician [facility] Pager: [phone], If no answer or between  15:00h - 7:00h: call [phone]  12/18/2016 11:54 PM

## 2016-12-19 NOTE — Telephone Encounter (Signed)
lmom tcb x2 please see MR's previous message about scheduling the pt

## 2016-12-20 ENCOUNTER — Encounter (HOSPITAL_COMMUNITY): Payer: Self-pay

## 2016-12-21 ENCOUNTER — Telehealth: Payer: Self-pay | Admitting: Pulmonary Disease

## 2016-12-21 NOTE — Telephone Encounter (Addendum)
Left vm on pt machine, pt needs a follow up appointment per AD. Per MR please schedule pt first availble with APP

## 2016-12-21 NOTE — Telephone Encounter (Signed)
Please disregard this message. Will close encounter nothing further is needed from you

## 2016-12-25 NOTE — Telephone Encounter (Signed)
lmtcb for pt. Pt needs OV with MR.

## 2016-12-26 NOTE — Telephone Encounter (Signed)
Will sign off. Please see phone note from 4.13.18.

## 2016-12-28 ENCOUNTER — Encounter (HOSPITAL_COMMUNITY): Payer: Self-pay

## 2016-12-31 NOTE — Telephone Encounter (Signed)
Called and spoke to pt. Pt states she was unable to schedule an appt at this time but will call back. Will keep message open to f/u with pt.

## 2017-01-08 NOTE — Telephone Encounter (Signed)
Pt has HFU appt with SG on 5.3.2018. Pt will need OV with MR after HFU visit. Will close message but keep in my box to assure f/u visit.

## 2017-01-10 ENCOUNTER — Telehealth: Payer: Self-pay | Admitting: Adult Health

## 2017-01-10 ENCOUNTER — Ambulatory Visit (INDEPENDENT_AMBULATORY_CARE_PROVIDER_SITE_OTHER): Payer: Medicaid Other | Admitting: Adult Health

## 2017-01-10 ENCOUNTER — Other Ambulatory Visit (INDEPENDENT_AMBULATORY_CARE_PROVIDER_SITE_OTHER): Payer: Medicaid Other

## 2017-01-10 ENCOUNTER — Ambulatory Visit (INDEPENDENT_AMBULATORY_CARE_PROVIDER_SITE_OTHER)
Admission: RE | Admit: 2017-01-10 | Discharge: 2017-01-10 | Disposition: A | Discharge status: Home / Self Care | Payer: Medicaid Other | Source: Ambulatory Visit | Attending: Adult Health | Admitting: Adult Health

## 2017-01-10 ENCOUNTER — Encounter (HOSPITAL_COMMUNITY): Payer: Self-pay

## 2017-01-10 ENCOUNTER — Emergency Department (HOSPITAL_COMMUNITY)
Admission: EM | Admit: 2017-01-10 | Discharge: 2017-01-10 | Disposition: A | Discharge status: Home / Self Care | Payer: Medicaid Other | Attending: Emergency Medicine | Admitting: Emergency Medicine

## 2017-01-10 ENCOUNTER — Encounter: Payer: Self-pay | Admitting: Adult Health

## 2017-01-10 VITALS — BP 110/82 | HR 95 | Ht 69.0 in | Wt 192.4 lb

## 2017-01-10 DIAGNOSIS — Z87891 Personal history of nicotine dependence: Secondary | ICD-10-CM | POA: Diagnosis not present

## 2017-01-10 DIAGNOSIS — R042 Hemoptysis: Secondary | ICD-10-CM

## 2017-01-10 DIAGNOSIS — Z794 Long term (current) use of insulin: Secondary | ICD-10-CM | POA: Diagnosis not present

## 2017-01-10 DIAGNOSIS — R7989 Other specified abnormal findings of blood chemistry: Secondary | ICD-10-CM | POA: Diagnosis not present

## 2017-01-10 DIAGNOSIS — L959 Vasculitis limited to the skin, unspecified: Secondary | ICD-10-CM | POA: Diagnosis not present

## 2017-01-10 DIAGNOSIS — I10 Essential (primary) hypertension: Secondary | ICD-10-CM | POA: Insufficient documentation

## 2017-01-10 DIAGNOSIS — M318 Other specified necrotizing vasculopathies: Secondary | ICD-10-CM | POA: Diagnosis not present

## 2017-01-10 DIAGNOSIS — Z8673 Personal history of transient ischemic attack (TIA), and cerebral infarction without residual deficits: Secondary | ICD-10-CM | POA: Diagnosis not present

## 2017-01-10 DIAGNOSIS — E119 Type 2 diabetes mellitus without complications: Secondary | ICD-10-CM | POA: Insufficient documentation

## 2017-01-10 DIAGNOSIS — Z79899 Other long term (current) drug therapy: Secondary | ICD-10-CM | POA: Insufficient documentation

## 2017-01-10 DIAGNOSIS — R944 Abnormal results of kidney function studies: Secondary | ICD-10-CM | POA: Diagnosis present

## 2017-01-10 LAB — CBC WITH DIFFERENTIAL/PLATELET
BASOS ABS: 0.1 10*3/uL (ref 0.0–0.1)
BASOS PCT: 0.8 % (ref 0.0–3.0)
EOS ABS: 0.1 10*3/uL (ref 0.0–0.7)
Eosinophils Relative: 0.8 % (ref 0.0–5.0)
HEMATOCRIT: 30.6 % — AB (ref 36.0–46.0)
Hemoglobin: 9.9 g/dL — ABNORMAL LOW (ref 12.0–15.0)
LYMPHS ABS: 1.4 10*3/uL (ref 0.7–4.0)
LYMPHS PCT: 15.9 % (ref 12.0–46.0)
MCHC: 32.2 g/dL (ref 30.0–36.0)
MCV: 78.8 fl (ref 78.0–100.0)
MONO ABS: 0.6 10*3/uL (ref 0.1–1.0)
Monocytes Relative: 6.5 % (ref 3.0–12.0)
NEUTROS ABS: 6.9 10*3/uL (ref 1.4–7.7)
Neutrophils Relative %: 76 % (ref 43.0–77.0)
PLATELETS: 474 10*3/uL — AB (ref 150.0–400.0)
RBC: 3.88 Mil/uL (ref 3.87–5.11)
RDW: 20.2 % — AB (ref 11.5–15.5)
WBC: 9.1 10*3/uL (ref 4.0–10.5)

## 2017-01-10 LAB — BASIC METABOLIC PANEL
BUN: 86 mg/dL — AB (ref 6–23)
CHLORIDE: 108 meq/L (ref 96–112)
CO2: 18 mEq/L — ABNORMAL LOW (ref 19–32)
Calcium: 9.7 mg/dL (ref 8.4–10.5)
Creatinine, Ser: 5.32 mg/dL (ref 0.40–1.20)
GFR: 10.51 mL/min — AB (ref >60.00)
Glucose, Bld: 136 mg/dL — ABNORMAL HIGH (ref 70–99)
POTASSIUM: 3.8 meq/L (ref 3.5–5.1)
SODIUM: 137 meq/L (ref 135–145)

## 2017-01-10 LAB — HEPATIC FUNCTION PANEL
ALT: 10 U/L (ref 0–35)
AST: 10 U/L (ref 0–37)
Albumin: 4 g/dL (ref 3.5–5.2)
Alkaline Phosphatase: 45 U/L (ref 39–117)
BILIRUBIN DIRECT: 0.1 mg/dL (ref 0.0–0.3)
BILIRUBIN TOTAL: 0.2 mg/dL (ref 0.2–1.2)
Total Protein: 7 g/dL (ref 6.0–8.3)

## 2017-01-10 LAB — SEDIMENTATION RATE: SED RATE: 70 mm/h — AB (ref 0–30)

## 2017-01-10 LAB — PHOSPHORUS: Phosphorus: 5.3 mg/dL — ABNORMAL HIGH (ref 2.3–4.6)

## 2017-01-10 LAB — MAGNESIUM: Magnesium: 2.2 mg/dL (ref 1.5–2.5)

## 2017-01-10 MED ORDER — SODIUM CHLORIDE 0.9 % IV BOLUS (SEPSIS): 250.0000 mL | Freq: Once | INTRAVENOUS | Status: DC

## 2017-01-10 NOTE — Patient Instructions (Addendum)
Labs today .  Chest xray today .  Follow up with Primary Doctor  Keep follow up Kidney doctor.  As soon as labs and xray are back will decide on next step  follow up Dr. Chase Caller in 4 weeks and As needed   Please contact office for sooner follow up if symptoms do not improve or worsen or seek emergency care

## 2017-01-10 NOTE — Telephone Encounter (Signed)
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  Catherine Aguilar voiced her understanding.  Did ask Catherine Aguilar that IF pt does not go to the ER today, to please call and let me know so that I can notify the ER.  Catherine Aguilar voiced her understanding.  Nothing further needed; will sign off.

## 2017-01-10 NOTE — ED Provider Notes (Signed)
[facility] DEPT Provider Note   CSN: [id number] Arrival date & time: 01/10/17  4709     History   Chief Complaint Chief Complaint  Patient presents with  . Abnormal Lab    HPI Catherine Aguilar is a 61 y.o. female.  The history is provided by the patient and medical records. No language interpreter was used.   Catherine Aguilar is a 61 y.o. female  with a PMH of ANCA + systemic vasculitis syndrome who presents to the Emergency Department for concerns of worsening kidney function. She was seen by pulmonology at routine hospital follow up appointment where labs and CXR were performed. CXR reassuring, however she was called by NP at pulm clinic and told her kidney function has worsened and to come to ER. She has been on steroids daily as well as Bactrim for prophylaxis - taking these as directed. Received Rituxan x 2 but has not been getting this for the last few weeks. She states that she "just doesn't feel herself" however no acute complaints today. Has had decreased appetite but still eating with no n/v. No fevers, abdominal pain, chest pain or shortness of breath.   Past Medical History:  Diagnosis Date  . High cholesterol   . Hypertension    stopped taking medications 10 years ago  . Stroke [facility])     Patient Active Problem List   Diagnosis Date Noted  . Diffuse pulmonary alveolar hemorrhage   . Essential hypertension 12/08/2016  . HLD (hyperlipidemia) 12/08/2016  . Tobacco abuse 12/08/2016  . AKI (acute kidney injury) ([place]) 12/08/2016  . Sepsis ([place]) 12/08/2016  . Hemoptysis 12/08/2016  . CAP (community acquired pneumonia) 12/08/2016  . Symptomatic anemia 12/08/2016  . Acute respiratory failure with hypoxia ([place]) 12/08/2016  . Wegener's granulomatosis with renal involvement ([place]) 12/08/2016  . Systemic vasculitis syndrome ([place]) 12/08/2016  . Intra-alveolar hemorrhage 12/08/2016  . Acute renal failure (ARF) ([place]) 12/08/2016  . 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  Substance abuse 12/08/2016  . Goals of care, counseling/discussion 12/08/2016  . CVA (cerebral vascular accident) ([place]) 07/03/2012  . Left facial numbness 07/02/2012  . Accelerated hypertension 07/01/2012    Past Surgical History:  Procedure Laterality Date  . ABDOMINAL HYSTERECTOMY  1985    OB History    No data available       Home Medications    Prior to Admission medications   Medication Sig Start Date End Date Taking? Authorizing Provider  furosemide (LASIX) 80 MG tablet Take 1 tablet (80 mg total) by mouth daily. 12/17/16  Yes Mariel Aloe, MD  hydrALAZINE (APRESOLINE) 25 MG tablet Take 3 tablets (75 mg total) by mouth 3 (three) times daily. 12/16/16  Yes Mariel Aloe, MD  insulin aspart (NOVOLOG) 100 UNIT/ML FlexPen Check your blood sugar three times daily before meals and use the following sliding scale depending on your blood sugar.  CBG 70 - 120: 0 units  CBG 121 - 150: 1 unit  CBG 151 - 200: 2 units  CBG 201 - 250: 3 units  CBG 251 - 300: 5 units  CBG 301 - 350: 7 units  CBG 351 - 400: 9 units 12/16/16  Yes Mariel Aloe, MD  loratadine (CLARITIN) 10 MG tablet TAKE ONE TABLET BY MOUTH EVERY DAY FOR NASAL CONGESTION 09/19/16  Yes Historical Provider, MD  metFORMIN (GLUCOPHAGE) 500 MG tablet TAKE 500 MG BY MOUTH AT BREAKFAST AND SUPPER 09/01/16  Yes Historical Provider, MD  pravastatin (PRAVACHOL) 40 MG tablet Take 1 tablet (  40 mg total) by mouth daily. For elevated cholesterol 07/07/12  Yes Ivan Anchors Love, PA-C  predniSONE (DELTASONE) 20 MG tablet Take 3 tablets (60 mg total) by mouth daily with breakfast. 12/17/16  Yes Mariel Aloe, MD  blood glucose meter kit and supplies KIT Dispense based on patient and insurance preference. Use up to four times daily as directed. (FOR ICD-9 250.00, 250.01). 12/16/16   Mariel Aloe, MD  nicotine (NICODERM CQ - DOSED IN MG/24 HOURS) 21 mg/24hr patch Place 1 patch (21 mg total) onto the skin daily. Patient not taking: Reported on  01/10/2017 12/17/16   Mariel Aloe, MD  polyethylene glycol [facility] / GLYCOLAX) packet Take 17 g by mouth daily. Patient not taking: Reported on 01/10/2017 12/17/16   Mariel Aloe, MD  sulfamethoxazole-trimethoprim (BACTRIM DS,SEPTRA DS) 800-160 MG tablet Take 1 tablet by mouth 3 (three) times a week. Patient not taking: Reported on 01/10/2017 12/17/16   Mariel Aloe, MD    Family History Family History  Problem Relation Age of Onset  . Cancer Father   . Heart attack Mother     Social History Social History  Substance Use Topics  . Smoking status: Former Smoker    Packs/day: 0.50    Years: 40.00    Types: Cigarettes    Quit date: 12/07/2016  . Smokeless tobacco: Never Used  . Alcohol use Yes     Comment: on the weekends - moderately per pt     Allergies   Patient has no known allergies.   Review of Systems Review of Systems  Constitutional: Positive for fatigue.  All other systems reviewed and are negative.    Physical Exam Updated Vital Signs BP (!) 126/98   Pulse 73   Temp 99.4 F (37.4 C) (Oral)   Resp 19   Ht '5\' 9"'  (1.753 m)   Wt 87.1 kg   SpO2 100%   BMI 28.35 kg/m   Physical Exam  Constitutional: She is oriented to person, place, and time. She appears well-developed and well-nourished. No distress.  HENT:  Head: Normocephalic and atraumatic.  Cardiovascular: Normal rate, regular rhythm and normal heart sounds.   No murmur heard. Pulmonary/Chest: Effort normal and breath sounds normal. No respiratory distress.  Abdominal: Soft. She exhibits no distension. There is no tenderness.  Musculoskeletal: She exhibits no edema.  Neurological: She is alert and oriented to person, place, and time.  Skin: Skin is warm and dry.  Nursing note and vitals reviewed.    ED Treatments / Results  Labs (all labs ordered are listed, but only abnormal results are displayed) Labs Reviewed - No data to display  EKG  EKG Interpretation None       Radiology Dg  Chest 2 View  Result Date: 01/10/2017 CLINICAL DATA:  Cough and congestion EXAM: CHEST  2 VIEW COMPARISON:  12/12/2016 FINDINGS: Cardiac shadow is within normal limits. The lungs are well aerated bilaterally. No focal infiltrate or sizable effusion is seen. Previously seen right upper lobe infiltrate has resolved. No acute bony abnormality is noted. IMPRESSION: Resolution of previously seen right upper lobe infiltrate. Electronically Signed   By: Inez Catalina M.D.   On: 01/10/2017 10:19    Procedures Procedures (including critical care time)  Medications Ordered in ED Medications - No data to display   Initial Impression / Assessment and Plan / ED Course  I have reviewed the triage vital signs and the nursing notes.  Pertinent labs & imaging results that were  available during my care of the patient were reviewed by me and considered in my medical decision making (see chart for details).    Catherine Aguilar is a 62 y.o. female who presents to ED for concerns of worsening kidney function. Patient was recently diagnosed with ANCA + systemic vasculitis. She was at routine 1 month post-hospitalization appointment where labs were drawn. Baseline creatinine at time of hospitalization last month was around 4-4.5 (4.13 at discharge) Today creatinine was 5.32. Pulm notified her of worsening kidney function and instructed her to come to ER. On exam, patient is afebrile, hemodynamically stable and does not appear acutely ill. Other than decreased appetite, she has no complaints. Daughter at bedside very active and attentive to her mother's condition. She has an organized folder with all medications, appointments and hospital record information. Nephrology was consulted and I spoke at length with Dr. Lorrene Reid about patient care. Nephrology does not believe patient will benefit from hospitalization as there is not much they would do for her from an inpatient stand point. Dr. Lorrene Reid graciously has worked patient into  her schedule in clinic tomorrow and will have her come for appointment and labs tomorrow at noon. Will also coordinate and get patient back on Rituxin regimen. Patient is still taking Metformin for DM along with sliding scale insulin. Will d/c Metformin given creatinine and have her strictly use SSI until she follows up with PCP. I have discussed this plan at length with patient and daughter who understand and agree with plan. They both seem responsible to follow up and will return to ER if any concerns arise. All questions answered.    Final Clinical Impressions(s) / ED Diagnoses   Final diagnoses:  Systemic vasculitis ([place])  Elevated serum creatinine    New Prescriptions Discharge Medication List as of 01/10/2017  7:34 PM       [place], PA-C 01/10/17 [place], MD 01/11/17 [phone]

## 2017-01-10 NOTE — Progress Notes (Signed)
Called spoke with patient's daughter Tanna Savoy, advised of cxr results / recs as stated by TP.  Zarana verbalized her understanding and denied any questions.

## 2017-01-10 NOTE — ED Triage Notes (Addendum)
Per Pt's family, Pt is coming from [place] where she had a follow-up appointment after being discharged from the hospital. After lab work, pt was called and told to come to the ED due to kidney function labs. Pt reports some SOB

## 2017-01-10 NOTE — ED Notes (Signed)
PA Ward advised IV start can be held at this time until she speaks with nephrologist.

## 2017-01-10 NOTE — Assessment & Plan Note (Addendum)
ANCA vasulitis with lung and kidney involvement s/p Rituxan x 2 . On high dose prednisone 60mg  daily  Pt is clinically improving with decreased hemoptysis (but not total resolution)  She did not begin Rituxan as planned .  Will get labs and chest xray today . Will decide if able to get started in OP setting or may need readmission for treatment plan   Plan  Patient Instructions  Labs today .  Chest xray today .  Follow up with Primary Doctor  Keep follow up Kidney doctor.  As soon as labs and xray are back will decide on next step  follow up Dr. Chase Caller in 4 weeks and As needed   Please contact office for sooner follow up if symptoms do not improve or worsen or seek emergency care

## 2017-01-10 NOTE — ED Notes (Addendum)
Zarana pts daughter left her number in case we need to contact her with any questions that patient is unable to answer [phone]

## 2017-01-10 NOTE — Progress Notes (Signed)
_0  ID: Catherine Aguilar, female    DOB: 04-16-1956, 61 y.o.   MRN: [id number]  Chief Complaint  Patient presents with  . Follow-up    Referring provider: Iona Beard, MD  HPI: 61 yo female smoker seen for pulmonary consult for Systemic vaculitis syndrome  Polysubstance abuse   01/10/2017 Follow up : Post hospital follow up : Systemic vasculiits syndrome  Pt returns for 1 month follow up . She was seen for PCCM consult during hospitalization in April for acute respiratory failure found to have systemic vasculitis syndrome with lung/sinus/kidney involvement . ANCA was positive, myeloperoxidase ab positive . She was started on prednisone . Discharged on 64m daily. Recevied Rituxan x2 . Bactrim for prophylaxis.  Kidney bx came back with PAUCI IMMUNE ANCA ASSOCIATED NECROTIZING AND CRESCENTIC GLOMERULONEPHRITIS WITH 53% CELLULAR TO FOCAL FIBROCELLULAR CRESCENT FORMATION AND DIFFUSE ACUTE TUBULAR INJURY. MILD TO MODERATE ARTERIOSCLEROSIS WITH MILD TUBULOINTERSTITIAL SCARRING.  Has upcoming office visit with Nephrology Dr. GMoshe Cipro.  She was to be started on Rituxan weekly but this did not get started .   Since discharge she is feeling better but remains very weak with decreased appetite  Hemoptysis is much better , still has some but small amt of light tinged blood.  Dyspnea is decreased but gets winded with walking.  No fever , rash , or hematuria .   Has DM , says bs are doing okay on insulin Steroid pt education  Has quit smoking    No Known Allergies  Immunization History  Administered Date(s) Administered  . Influenza Split 07/02/2012  . Pneumococcal Polysaccharide-23 07/02/2012    Past Medical History:  Diagnosis Date  . High cholesterol   . Hypertension    stopped taking medications 10 years ago  . Stroke ([facility]     Tobacco History: History  Smoking Status  . Former Smoker  . Packs/day: 0.50  . Years: 40.00  . Types: Cigarettes  . Quit date:  12/07/2016  Smokeless Tobacco  . Never Used   Counseling given: Not Answered   Outpatient Encounter Prescriptions as of 01/10/2017  Medication Sig  . blood glucose meter kit and supplies KIT Dispense based on patient and insurance preference. Use up to four times daily as directed. (FOR ICD-9 250.00, 250.01).  . furosemide (LASIX) 80 MG tablet Take 1 tablet (80 mg total) by mouth daily.  . hydrALAZINE (APRESOLINE) 25 MG tablet Take 3 tablets (75 mg total) by mouth 3 (three) times daily.  . insulin aspart (NOVOLOG) 100 UNIT/ML FlexPen Check your blood sugar three times daily before meals and use the following sliding scale depending on your blood sugar.  CBG 70 - 120: 0 units  CBG 121 - 150: 1 unit  CBG 151 - 200: 2 units  CBG 201 - 250: 3 units  CBG 251 - 300: 5 units  CBG 301 - 350: 7 units  CBG 351 - 400: 9 units  . loratadine (CLARITIN) 10 MG tablet TAKE ONE TABLET BY MOUTH EVERY DAY FOR NASAL CONGESTION  . metFORMIN (GLUCOPHAGE) 500 MG tablet TAKE 500 MG BY MOUTH AT BREAKFAST AND SUPPER  . pravastatin (PRAVACHOL) 40 MG tablet Take 1 tablet (40 mg total) by mouth daily. For elevated cholesterol  . predniSONE (DELTASONE) 20 MG tablet Take 3 tablets (60 mg total) by mouth daily with breakfast.  . PROVENTIL HFA 108 (90 Base) MCG/ACT inhaler INHALE 2 PUFFS EVERY 4-6 HOURS PRN FOR SOB AND WHEEZING  . sodium chloride (OCEAN) 0.65 % SOLN nasal  spray Place 1 spray into both nostrils 3 (three) times daily.  . fluticasone (FLONASE) 50 MCG/ACT nasal spray Place 1 spray into the nose daily as needed for congestion.  . nicotine (NICODERM CQ - DOSED IN MG/24 HOURS) 21 mg/24hr patch Place 1 patch (21 mg total) onto the skin daily. (Patient not taking: Reported on 01/10/2017)  . polyethylene glycol (MIRALAX / GLYCOLAX) packet Take 17 g by mouth daily. (Patient not taking: Reported on 01/10/2017)  . sulfamethoxazole-trimethoprim (BACTRIM DS,SEPTRA DS) 800-160 MG tablet Take 1 tablet by mouth 3 (three) times  a week. (Patient not taking: Reported on 01/10/2017)   No facility-administered encounter medications on file as of 01/10/2017.      Review of Systems  Constitutional:   No  weight loss, night sweats,  Fevers, chills,  +fatigue, or  lassitude.  HEENT:   No headaches,  Difficulty swallowing,  Tooth/dental problems, or  Sore throat,                No sneezing, itching, ear ache, nasal congestion, post nasal drip,   CV:  No chest pain,  Orthopnea, PND, swelling in lower extremities, anasarca, dizziness, palpitations, syncope.   GI  No heartburn, indigestion, abdominal pain, nausea, vomiting, diarrhea, change in bowel habits, loss of appetite, bloody stools.   Resp:  .  No chest wall deformity  Skin: no rash or lesions.  GU: no dysuria, change in color of urine, no urgency or frequency.  No flank pain, no hematuria   MS:  No joint pain or swelling.  No decreased range of motion.  No back pain.    Physical Exam  BP 110/82 (BP Location: Left Arm, Cuff Size: Normal)   Pulse 95   Ht _0  (1.753 m)   Wt 192 lb 6.4 oz (87.3 kg)   SpO2 100%   BMI 28.41 kg/m   GEN: A/Ox3; pleasant , NAD, elderly    HEENT:  /AT,  EACs-clear, TMs-wnl, NOSE-clear, THROAT-clear, no lesions, no postnasal drip or exudate noted.   NECK:  Supple w/ fair ROM; no JVD; normal carotid impulses w/o bruits; no thyromegaly or nodules palpated; no lymphadenopathy.    RESP  Few rhonchi   no accessory muscle use, no dullness to percussion  CARD:  RRR, no m/r/g, no peripheral edema, pulses intact, no cyanosis or clubbing.  GI:   Soft & nt; nml bowel sounds; no organomegaly or masses detected.   Musco: Warm bil, no deformities or joint swelling noted.   Neuro: alert, no focal deficits noted.    Skin: Warm, no lesions or rashes    Lab Results:  CBC    Component Value Date/Time   WBC 20.1 (H) 12/16/2016 0445   RBC 3.94 12/16/2016 0445   HGB 9.6 (L) 12/16/2016 0445   HCT 30.4 (L) 12/16/2016 0445   PLT  597 (H) 12/16/2016 0445   MCV 77.2 (L) 12/16/2016 0445   MCH 24.4 (L) 12/16/2016 0445   MCHC 31.6 12/16/2016 0445   RDW 18.0 (H) 12/16/2016 0445   LYMPHSABS 1.5 12/08/2016 0909   MONOABS 0.5 12/08/2016 0909   EOSABS 0.0 12/08/2016 0909   BASOSABS 0.0 12/08/2016 0909    BMET    Component Value Date/Time   NA 137 12/16/2016 0445   K 3.7 12/16/2016 0445   CL 105 12/16/2016 0445   CO2 21 (L) 12/16/2016 0445   GLUCOSE 89 12/16/2016 0445   BUN 101 (H) 12/16/2016 0445   CREATININE 4.13 (H) 12/16/2016 0445  CALCIUM 9.1 12/16/2016 0445   GFRNONAA 11 (L) 12/16/2016 0445   GFRAA 12 (L) 12/16/2016 0445    BNP No results found for: BNP  ProBNP No results found for: PROBNP  Imaging: US Biopsy  Result Date: 12/14/2016 INDICATION: Hypertension, renal failure, vasculitis EXAM: ULTRASOUND GUIDED CORE BIOPSY OF LEFT KIDNEY MEDICATIONS: 1% LIDOCAINE LOCALLY ANESTHESIA/SEDATION: Versed 1.59m IV; Fentanyl 245m IV; Moderate Sedation Time:  10 MINUTES The patient was continuously monitored during the procedure by the interventional radiology nurse under my direct supervision. FLUOROSCOPY TIME:  Fluoroscopy Time: NONE. COMPLICATIONS: None immediate. PROCEDURE: The procedure, risks, benefits, and alternatives were explained to the patient. Questions regarding the procedure were encouraged and answered. The patient understands and consents to the procedure. Patient positioned prone. Preliminary ultrasound performed. Left kidney lower pole was localized. Overlying skin marked. The left posterior flank was prepped with ChloraPrep in a sterile fashion, and a sterile drape was applied covering the operative field. A sterile gown and sterile gloves were used for the procedure. Local anesthesia was provided with 1% Lidocaine. Under sterile conditions and local anesthesia, a 16 gauge access needle was advanced to the left kidney lower pole under direct ultrasound. Through the access, 2 16 gauge core biopsies  obtained of the lower pole cortex. Samples placed in saline. These were intact and non fragmented. Postprocedure imaging demonstrates no hemorrhage or hematoma. Patient tolerated the biopsy well. FINDINGS: Imaging confirms needle placement to the left kidney lower pole for core biopsy IMPRESSION: Successful ultrasound left kidney biopsy Electronically Signed   By: M.Jerilynn Mages Shick M.D.   On: 12/14/2016 10:31   Dg Chest Port 1 View  Result Date: 12/12/2016 CLINICAL DATA:  Respiratory failure. EXAM: PORTABLE CHEST 1 VIEW COMPARISON:  12/09/2016.  CT 12/08/2016. FINDINGS: Persistent diffuse right lung infiltrate particularly prominent in the right upper lobe. Findings have improved slightly from prior exam. Mild left mid lung field subsegmental atelectasis. No pleural effusion or pneumothorax. Stable cardiomegaly. No pulmonary venous congestion . IMPRESSION: Persistent diffuse right lung infiltrate, particularly prominent in the right upper lobe. Findings have improved slightly from prior exam. Electronically Signed   By: ThAugusta On: 12/12/2016 07:35     Assessment & Plan:   No problem-specific Assessment & Plan notes found for this encounter.     TaRexene EdisonNP 01/10/2017

## 2017-01-10 NOTE — Progress Notes (Signed)
Called [facility] ER and spoke with triage charge nurse Janett Billow

## 2017-01-10 NOTE — Discharge Instructions (Signed)
You will be receiving a phone call tomorrow about your follow up appointments. We spoke to the nephrologist (kidney doctor) and she will be calling tomorrow to schedule an appointment to get your labs checked on Monday. She will also be calling your pulmonologist (lung doctor) to discuss your care with them as well. Please quit taking your Metformin (for your diabetes). This can be tough on your kidneys. Because you will be discontinuing one of your diabetes medications, this means you will have to keep a very close eye on your sugars and follow your sliding scale insulin as needed.  If you begin feeling worse, develop fever or new symptoms or any additional concerns, please return to the ER.  If for some reason you do not get a phone call by mid-day tomorrow, please call the nephrologist listed and inquire about follow up care.

## 2017-01-11 ENCOUNTER — Telehealth: Payer: Self-pay | Admitting: Internal Medicine

## 2017-01-11 NOTE — Telephone Encounter (Signed)
Catherine Aguilar (copy to Dr Lorrene Reid renal)  Top priority: please try to organize Rituxan ASAP for this patient. She needs 2 more weekly  doses. She only got 2 doses in hospital and has been over a month. B cell population still depleted and she is probably ok but next week or two we need the rituxan via short stay  I have also place a call to pharmacy  She should hold off her metformin - I think ER told her that  THanks  Dr. Brand Males, M.D., [facility].C.P Pulmonary and Critical Care Medicine Staff Physician [place] [facility] Pager: [phone], If no answer or between  15:00h - 7:00h: call [phone]  01/11/2017 10:00 AM        PULMONARY No results for input(s): PHART, PCO2ART, PO2ART, HCO3, TCO2, O2SAT in the last 168 hours.  Invalid input(s): PCO2, PO2  CBC  Recent Labs Lab 01/10/17 1031  HGB 9.9*  HCT 30.6*  WBC 9.1  PLT 474.0*    COAGULATION No results for input(s): INR in the last 168 hours.  CARDIAC  No results for input(s): TROPONINI in the last 168 hours. No results for input(s): PROBNP in the last 168 hours.   CHEMISTRY  Recent Labs Lab 01/10/17 1031  NA 137  K 3.8  CL 108  CO2 18*  GLUCOSE 136*  BUN 86*  CREATININE 5.32*  CALCIUM 9.7  MG 2.2  PHOS 5.3*   Estimated Creatinine Clearance: 13.1 mL/min (A) (by C-G formula based on SCr of 5.32 mg/dL [facility])).   LIVER  Recent Labs Lab 01/10/17 1031  AST 10  ALT 10  ALKPHOS 45  BILITOT 0.2  PROT 7.0  ALBUMIN 4.0     INFECTIOUS No results for input(s): LATICACIDVEN, PROCALCITON in the last 168 hours.   ENDOCRINE CBG (last 3)  No results for input(s): GLUCAP in the last 72 hours.       IMAGING x48h  - image(s) personally visualized  -   highlighted in bold Dg Chest 2 View  Result Date: 01/10/2017 CLINICAL DATA:  Cough and congestion EXAM: CHEST  2 VIEW COMPARISON:  12/12/2016 FINDINGS: Cardiac shadow is within normal limits. The lungs are well  aerated bilaterally. No focal infiltrate or sizable effusion is seen. Previously seen right upper lobe infiltrate has resolved. No acute bony abnormality is noted. IMPRESSION: Resolution of previously seen right upper lobe infiltrate. Electronically Signed   By: Inez Catalina M.D.   On: 01/10/2017 10:19

## 2017-01-11 NOTE — Telephone Encounter (Signed)
Called and spoke to pt. Informed her recs per MR. Pt aware that I have been in contact with Short Stay and she needs to make an appt with them, she states her daughter, Catherine Aguilar, makes her appts. Called Catherine Aguilar and informed her to call Short Stay at [phone] and schedule the Rituxan infusion as soon as possible. Catherine Aguilar verbalized understanding and denied any further questions or concerns at this time.   Will keep message in box till appt has been made.

## 2017-01-11 NOTE — Telephone Encounter (Signed)
Pts first Rituxan infusion is scheduled for 01/21/2017.   Will forward to MR as FYI.

## 2017-01-14 LAB — GLUCOSE 6 PHOSPHATE DEHYDROGENASE: G-6PDH: 19.1 U/g Hgb (ref 7.0–20.5)

## 2017-01-14 NOTE — Telephone Encounter (Signed)
Daneil Dan  Really crucial and critical they keep and confirm that appt on 01/21/17. Please confirm wit them and let me know (send msg back) so I can track  Thanks  Dr. Brand Males, M.D., [facility].C.P Pulmonary and Critical Care Medicine Staff Physician [place] [facility] Pager: [phone], If no answer or between  15:00h - 7:00h: call [phone]  01/14/2017 6:59 AM

## 2017-01-14 NOTE — Telephone Encounter (Signed)
MR  Spoke with Zarana and informed her of your message. She understood and states she will make sure she doesn't miss appt. She had no further questions at this time

## 2017-01-17 ENCOUNTER — Other Ambulatory Visit (HOSPITAL_COMMUNITY): Payer: Self-pay | Admitting: *Deleted

## 2017-01-21 ENCOUNTER — Ambulatory Visit (HOSPITAL_COMMUNITY)
Admission: RE | Admit: 2017-01-21 | Discharge: 2017-01-21 | Disposition: A | Discharge status: Home / Self Care | Payer: Medicaid Other | Source: Ambulatory Visit | Attending: Internal Medicine | Admitting: Internal Medicine

## 2017-01-21 DIAGNOSIS — J984 Other disorders of lung: Secondary | ICD-10-CM | POA: Diagnosis not present

## 2017-01-21 MED ORDER — SODIUM CHLORIDE 0.9 % IV SOLN
375.0000 mg/m2 | INTRAVENOUS | Status: DC
  Filled 2017-01-21: qty 80

## 2017-01-21 MED ORDER — DIPHENHYDRAMINE HCL 25 MG PO CAPS: 50.0000 mg | ORAL_CAPSULE | ORAL | Status: DC

## 2017-01-21 MED ORDER — RITUXIMAB CHEMO INJECTION 500 MG/50ML
375.0000 mg/m2 | INTRAVENOUS | Status: AC
  Administered 2017-01-21: 10:00:00 800 mg via INTRAVENOUS
  Filled 2017-01-21 (×3): qty 80

## 2017-01-21 MED ORDER — ACETAMINOPHEN 325 MG PO TABS
650.0000 mg | ORAL_TABLET | ORAL | Status: DC
  Administered 2017-01-21: 650 mg via ORAL

## 2017-01-21 MED ORDER — DIPHENHYDRAMINE HCL 25 MG PO CAPS
ORAL_CAPSULE | ORAL | Status: AC
  Administered 2017-01-21: 10:00:00 50 mg
  Filled 2017-01-21: qty 2

## 2017-01-21 MED ORDER — ACETAMINOPHEN 325 MG PO TABS
ORAL_TABLET | ORAL | Status: AC
  Filled 2017-01-21: qty 2

## 2017-01-21 MED ORDER — SODIUM CHLORIDE 0.9 % IV SOLN
INTRAVENOUS | Status: DC
  Administered 2017-01-21: 10:00:00 via INTRAVENOUS

## 2017-01-21 NOTE — Discharge Instructions (Signed)

## 2017-01-21 NOTE — Telephone Encounter (Signed)
She was at infusion 01/21/2017  D/w infusion nruse who indicated dose was 800mg  total dose which worked out to 375mg /m2 x BSA 2.13 which correlated to her wieght and height today when 787mg  was rounded  She has one more infusion next week She has appt to see me 02/12/17  Will close message  Dr. Brand Males, M.D., [facility]C.P Pulmonary and Critical Care Medicine Staff Physician [place] [facility] Pager: [phone], If no answer or between  15:00h - 7:00h: call [phone]  01/21/2017 10:39 AM

## 2017-01-23 LAB — QUANTIFERON IN TUBE
QFT TB AG MINUS NIL VALUE: <0 IU/mL
QUANTIFERON MITOGEN VALUE: 0.85 [IU]/mL
QUANTIFERON NIL VALUE: 0.06 [IU]/mL
QUANTIFERON TB AG VALUE: 0.05 [IU]/mL
QUANTIFERON TB GOLD: NEGATIVE

## 2017-01-23 LAB — QUANTIFERON TB GOLD ASSAY (BLOOD)

## 2017-01-25 ENCOUNTER — Other Ambulatory Visit (HOSPITAL_COMMUNITY): Payer: Self-pay | Admitting: *Deleted

## 2017-01-25 NOTE — Progress Notes (Signed)
Called and spoke to pt. Informed her of the results and recs per MR. Pt verbalized understanding and denied any further questions or concerns at this time.   

## 2017-01-28 ENCOUNTER — Encounter (HOSPITAL_COMMUNITY): Admission: RE | Admit: 2017-01-28 | Payer: Medicaid Other | Source: Ambulatory Visit

## 2017-01-28 ENCOUNTER — Encounter: Payer: Self-pay | Admitting: Adult Health

## 2017-01-28 ENCOUNTER — Ambulatory Visit (INDEPENDENT_AMBULATORY_CARE_PROVIDER_SITE_OTHER): Payer: Medicaid Other | Admitting: Adult Health

## 2017-01-28 ENCOUNTER — Other Ambulatory Visit (INDEPENDENT_AMBULATORY_CARE_PROVIDER_SITE_OTHER): Payer: Medicaid Other

## 2017-01-28 ENCOUNTER — Encounter (HOSPITAL_COMMUNITY): Payer: Self-pay

## 2017-01-28 ENCOUNTER — Encounter (HOSPITAL_COMMUNITY)
Admission: RE | Admit: 2017-01-28 | Discharge: 2017-01-28 | Disposition: A | Discharge status: Home / Self Care | Payer: Medicaid Other | Source: Ambulatory Visit | Attending: Internal Medicine | Admitting: Internal Medicine

## 2017-01-28 VITALS — BP 152/74 | HR 63 | Temp 98.3°F | Ht 69.0 in | Wt 205.0 lb

## 2017-01-28 DIAGNOSIS — M318 Other specified necrotizing vasculopathies: Secondary | ICD-10-CM

## 2017-01-28 DIAGNOSIS — R042 Hemoptysis: Secondary | ICD-10-CM

## 2017-01-28 DIAGNOSIS — R0489 Hemorrhage from other sites in respiratory passages: Secondary | ICD-10-CM

## 2017-01-28 DIAGNOSIS — N170 Acute kidney failure with tubular necrosis: Secondary | ICD-10-CM

## 2017-01-28 DIAGNOSIS — I776 Arteritis, unspecified: Secondary | ICD-10-CM | POA: Insufficient documentation

## 2017-01-28 LAB — BASIC METABOLIC PANEL
BUN: 81 mg/dL — ABNORMAL HIGH (ref 6–23)
CALCIUM: 9.4 mg/dL (ref 8.4–10.5)
CO2: 21 mEq/L (ref 19–32)
CREATININE: 3.35 mg/dL — AB (ref 0.40–1.20)
Chloride: 105 mEq/L (ref 96–112)
GFR: 17.91 mL/min — AB (ref >60.00)
Glucose, Bld: 140 mg/dL — ABNORMAL HIGH (ref 70–99)
Potassium: 4.4 mEq/L (ref 3.5–5.1)
Sodium: 137 mEq/L (ref 135–145)

## 2017-01-28 LAB — CBC WITH DIFFERENTIAL/PLATELET
Basophils Absolute: 0 10*3/uL (ref 0.0–0.1)
Basophils Relative: 0 % (ref 0.0–3.0)
EOS ABS: 0 10*3/uL (ref 0.0–0.7)
EOS PCT: 0 % (ref 0.0–5.0)
HCT: 29.6 % — ABNORMAL LOW (ref 36.0–46.0)
Hemoglobin: 9.4 g/dL — ABNORMAL LOW (ref 12.0–15.0)
Lymphocytes Relative: 11.7 % — ABNORMAL LOW (ref 12.0–46.0)
Lymphs Abs: 1.6 10*3/uL (ref 0.7–4.0)
MCHC: 31.6 g/dL (ref 30.0–36.0)
MCV: 79.6 fl (ref 78.0–100.0)
MONO ABS: 0.6 10*3/uL (ref 0.1–1.0)
Monocytes Relative: 4.1 % (ref 3.0–12.0)
NEUTROS PCT: 84.2 % — AB (ref 43.0–77.0)
Neutro Abs: 11.3 10*3/uL — ABNORMAL HIGH (ref 1.4–7.7)
Platelets: 380 10*3/uL (ref 150.0–400.0)
RBC: 3.72 Mil/uL — ABNORMAL LOW (ref 3.87–5.11)
RDW: 20.5 % — AB (ref 11.5–15.5)
WBC: 13.5 10*3/uL — ABNORMAL HIGH (ref 4.0–10.5)

## 2017-01-28 MED ORDER — ACETAMINOPHEN 325 MG PO TABS: 650.0000 mg | ORAL_TABLET | ORAL | Status: DC

## 2017-01-28 MED ORDER — RITUXIMAB CHEMO INJECTION 500 MG/50ML
375.0000 mg/m2 | INTRAVENOUS | Status: DC
  Filled 2017-01-28: qty 80

## 2017-01-28 MED ORDER — DARBEPOETIN ALFA 100 MCG/0.5ML IJ SOSY: 100.0000 ug | PREFILLED_SYRINGE | INTRAMUSCULAR | Status: DC

## 2017-01-28 MED ORDER — DIPHENHYDRAMINE HCL 25 MG PO CAPS: 50.0000 mg | ORAL_CAPSULE | ORAL | Status: DC

## 2017-01-28 MED ORDER — SODIUM CHLORIDE 0.9 % IV SOLN: INTRAVENOUS | Status: DC

## 2017-01-28 MED ORDER — ACETAMINOPHEN 325 MG PO TABS
ORAL_TABLET | ORAL | Status: AC
  Filled 2017-01-28: qty 2

## 2017-01-28 MED ORDER — DIPHENHYDRAMINE HCL 25 MG PO CAPS
ORAL_CAPSULE | ORAL | Status: AC
  Filled 2017-01-28: qty 2

## 2017-01-28 NOTE — Discharge Instructions (Signed)

## 2017-01-28 NOTE — Progress Notes (Signed)
'@Patient'  ID: Catherine [facility], female    DOB: October 09, 1955, 61 y.o.   MRN: [id number]  Chief Complaint  Patient presents with  . Acute Visit    Referring provider: Iona Beard, MD  HPI: 61 yo female smoker seen for pulmonary consult for Systemic vaculitis syndrome  Polysubstance abuse History   01/28/2017 Follow up : Medication reaction Patient presents for an acute office visit. Patient was at the [facility] this morning for her Rituxan infusion #2 since discharge (total received has 3) . Patient was seen in the office 2 weeks ago for a post [facility] follow-up.  She was seen during hospitalization by pulmonary and critical care in April for acute respiratory failure . Found to have systemic vasculitis syndrome with lung, sinus, kidney involvement.ANCA was positive. Myeloperoxidase abx posiitve . She was started on high-dose prednisone 60 mg. And Rituxan x 2 .  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  Patient hemoptysis. Has totally resolved. Chest x-ray showed resolution of a right upper lobe infiltrate. However, patient's labs showed worsening renal failure with a serum creatinine trending up to 5.32. Patient was sent to the emergency room for consideration of hospitalization. However did not feel that she needed acute admission. And was referred to nephrology. Patient has been started on Rituxan . She received her first infusion 1 week ago . Went to go for infusion today and b/p was elevated ~180. She denied headache , visual/speech changes or ext weakness. Has noticed her face has been swollen and neck is puffy .  She remains on prednisone 42m daily. She denies eye swelling, tongue or lip swelling, dysphagia, trouble breathing, cough, wheezing, rash, joint swelling, syncope or palpitations.  Denies pruritis .  She says that her breathing is doing good. Her shortness of breath. Cough has totally resolved. She's had no  further hemoptysis..  B/p here is office is improved at 150/ 70 today .    No Known Allergies  Immunization History  Administered Date(s) Administered  . Influenza Split 07/02/2012  . Pneumococcal Polysaccharide-23 07/02/2012    Past Medical History:  Diagnosis Date  . High cholesterol   . Hypertension    stopped taking medications 10 years ago  . Stroke ([facility]     Tobacco History: History  Smoking Status  . Former Smoker  . Packs/day: 0.50  . Years: 40.00  . Types: Cigarettes  . Quit date: 12/07/2016  Smokeless Tobacco  . Never Used   Counseling given: Not Answered   Outpatient Encounter Prescriptions as of 01/28/2017  Medication Sig  . blood glucose meter kit and supplies KIT Dispense based on patient and insurance preference. Use up to four times daily as directed. (FOR ICD-9 250.00, 250.01).  . furosemide (LASIX) 80 MG tablet Take 1 tablet (80 mg total) by mouth daily.  . hydrALAZINE (APRESOLINE) 25 MG tablet Take 3 tablets (75 mg total) by mouth 3 (three) times daily.  . insulin aspart (NOVOLOG) 100 UNIT/ML FlexPen Check your blood sugar three times daily before meals and use the following sliding scale depending on your blood sugar.  CBG 70 - 120: 0 units  CBG 121 - 150: 1 unit  CBG 151 - 200: 2 units  CBG 201 - 250: 3 units  CBG 251 - 300: 5 units  CBG 301 - 350: 7 units  CBG 351 - 400: 9 units  . loratadine (CLARITIN) 10 MG tablet TAKE ONE TABLET BY MOUTH EVERY DAY FOR NASAL CONGESTION  .  metFORMIN (GLUCOPHAGE) 500 MG tablet TAKE 500 MG BY MOUTH AT BREAKFAST AND SUPPER  . nicotine (NICODERM CQ - DOSED IN MG/24 HOURS) 21 mg/24hr patch Place 1 patch (21 mg total) onto the skin daily.  . polyethylene glycol (MIRALAX / GLYCOLAX) packet Take 17 g by mouth daily.  . pravastatin (PRAVACHOL) 40 MG tablet Take 1 tablet (40 mg total) by mouth daily. For elevated cholesterol  . predniSONE (DELTASONE) 20 MG tablet Take 3 tablets (60 mg total) by mouth daily with  breakfast.  . sulfamethoxazole-trimethoprim (BACTRIM DS,SEPTRA DS) 800-160 MG tablet Take 1 tablet by mouth 3 (three) times a week.   Facility-Administered Encounter Medications as of 01/28/2017  Medication  . 0.9 %  sodium chloride infusion  . acetaminophen (TYLENOL) tablet 650 mg  . Darbepoetin Alfa (ARANESP) injection 100 mcg  . diphenhydrAMINE (BENADRYL) capsule 50 mg  . riTUXimab (RITUXAN) 800 mg in sodium chloride 0.9 % 250 mL (2.4242 mg/mL) chemo infusion     Review of Systems  Constitutional:   No  weight loss, night sweats,  Fevers, chills, + fatigue, or  lassitude.  HEENT:   No headaches,  Difficulty swallowing,  Tooth/dental problems, or  Sore throat,                No sneezing, itching, ear ache, nasal congestion, post nasal drip,   CV:  No chest pain,  Orthopnea, PND, swelling in lower extremities, anasarca, dizziness, palpitations, syncope.   GI  No heartburn, indigestion, abdominal pain, nausea, vomiting, diarrhea, change in bowel habits, loss of appetite, bloody stools.   Resp: No shortness of breath with exertion or at rest.  No excess mucus, no productive cough,  No non-productive cough,  No coughing up of blood.  No change in color of mucus.  No wheezing.  No chest wall deformity  Skin: no rash or lesions.  GU: no dysuria, change in color of urine, no urgency or frequency.  No flank pain, no hematuria   MS:  No joint pain or swelling.  No decreased range of motion.  No back pain.    Physical Exam  BP (!) 152/74 (BP Location: Left Arm, Cuff Size: Normal)   Pulse 63   Temp 98.3 F (36.8 C) (Oral)   Ht '5\' 9"'  (1.753 m)   Wt 205 lb (93 kg)   SpO2 98%   BMI 30.27 kg/m   GEN: A/Ox3; pleasant , NAD, obese    HEENT:  [place]/AT,  EACs-clear, TMs-wnl, NOSE-clear, THROAT-clear, no lesions, no postnasal drip or exudate noted.  Tongue is midline with no oral mucosa swelling . Puffiness noticed along face and neck ? Cushing appearance vs allergic response.   NECK:   Supple w/ fair ROM; no JVD; normal carotid impulses w/o bruits; no thyromegaly or nodules palpated; no lymphadenopathy.    RESP  Clear  P & A; w/o, wheezes/ rales/ or rhonchi. no accessory muscle use, no dullness to percussion  CARD:  RRR, no m/r/g, no peripheral edema, pulses intact, no cyanosis or clubbing.  GI:   Soft & nt; nml bowel sounds; no organomegaly or masses detected.   Musco: Warm bil, no deformities or joint swelling noted.   Neuro: alert, no focal deficits noted.    Skin: Warm, no lesions or rashes   Lab Results:  CBC    Component Value Date/Time   WBC 9.1 01/10/2017 1031   RBC 3.88 01/10/2017 1031   HGB 9.9 (L) 01/10/2017 1031   HCT 30.6 (L) 01/10/2017 1031  PLT 474.0 (H) 01/10/2017 1031   MCV 78.8 01/10/2017 1031   MCH 24.4 (L) 12/16/2016 0445   MCHC 32.2 01/10/2017 1031   RDW 20.2 (H) 01/10/2017 1031   LYMPHSABS 1.4 01/10/2017 1031   MONOABS 0.6 01/10/2017 1031   EOSABS 0.1 01/10/2017 1031   BASOSABS 0.1 01/10/2017 1031    BMET    Component Value Date/Time   NA 137 01/10/2017 1031   K 3.8 01/10/2017 1031   CL 108 01/10/2017 1031   CO2 18 (L) 01/10/2017 1031   GLUCOSE 136 (H) 01/10/2017 1031   BUN 86 (HH) 01/10/2017 1031   CREATININE 5.32 (HH) 01/10/2017 1031   CALCIUM 9.7 01/10/2017 1031   GFRNONAA 11 (L) 12/16/2016 0445   GFRAA 12 (L) 12/16/2016 0445    BNP No results found for: BNP  ProBNP No results found for: PROBNP  Imaging: Dg Chest 2 View  Result Date: 01/10/2017 CLINICAL DATA:  Cough and congestion EXAM: CHEST  2 VIEW COMPARISON:  12/12/2016 FINDINGS: Cardiac shadow is within normal limits. The lungs are well aerated bilaterally. No focal infiltrate or sizable effusion is seen. Previously seen right upper lobe infiltrate has resolved. No acute bony abnormality is noted. IMPRESSION: Resolution of previously seen right upper lobe infiltrate. Electronically Signed   By: Inez Catalina M.D.   On: 01/10/2017 10:19     Assessment &  Plan:   No problem-specific Assessment & Plan notes found for this encounter.     Rexene Edison, NP 01/28/2017

## 2017-01-28 NOTE — Assessment & Plan Note (Signed)
Cont follow up with Renal .  Check bmet today

## 2017-01-28 NOTE — Assessment & Plan Note (Signed)
Resolved

## 2017-01-28 NOTE — Progress Notes (Signed)
Pt stated in the last 3 days she has had neck and face swelling but denies any difficulty swallowing or any other symptoms.  Called Dr Chase Caller and he stated he was out of town but would like her to get seen at his office today for evaluation and blood work and if everything checked out okay then could reschedule with [place] at medical daycare.  I spoke with Janett Billow at [facility] office and she said they had a 10:00 appt with [name], NP.  I gave the pt the address and phone number to the office, along with the appt time and who she was to see there.  I also let them know to ask the office about rescheduling with [place] and  Pt and daughter verbalized understanding and were to go straight to the office.

## 2017-01-28 NOTE — Patient Instructions (Addendum)
Begin Zyrtec 10mg  daily in am for 3 days .  Begin Pepcid 20mg  At bedtime  At bedtime  For 3 days.  Begin Benadryl 25mg  At bedtime  For 3 days .  Labs today .  Follow up with Dr. Chase Caller in 2 weeks as planned and As needed   Please contact office for sooner follow up if symptoms do not improve or worsen or seek emergency care

## 2017-01-28 NOTE — Assessment & Plan Note (Signed)
Pulmonary standpoint much improved  Will check BMET for kidney response  ? Medication reaction vs side effect of prednisone  tx w/ Zyrtec , Benadryl and Pepcid x 3 days .   Plan  Patient Instructions  Begin Zyrtec 10mg  daily in am for 3 days .  Begin Pepcid 20mg  At bedtime  At bedtime  For 3 days.  Begin Benadryl 25mg  At bedtime  For 3 days .  Labs today .  Follow up with Dr. Chase Caller in 2 weeks as planned and As needed   Please contact office for sooner follow up if symptoms do not improve or worsen or seek emergency care

## 2017-01-29 ENCOUNTER — Encounter (HOSPITAL_COMMUNITY): Payer: Medicaid Other

## 2017-01-30 NOTE — Progress Notes (Signed)
Called Short Stay and spoke with [name] - pt rescheduled for Rituxin tomorrow 5.24.18 @ 0800.  Pt is okay with this date/time

## 2017-01-31 ENCOUNTER — Encounter (HOSPITAL_COMMUNITY)
Admission: RE | Admit: 2017-01-31 | Discharge: 2017-01-31 | Disposition: A | Discharge status: Home / Self Care | Payer: Medicaid Other | Source: Ambulatory Visit | Attending: Internal Medicine | Admitting: Internal Medicine

## 2017-01-31 DIAGNOSIS — N170 Acute kidney failure with tubular necrosis: Secondary | ICD-10-CM

## 2017-01-31 DIAGNOSIS — I776 Arteritis, unspecified: Secondary | ICD-10-CM | POA: Diagnosis not present

## 2017-01-31 LAB — POCT HEMOGLOBIN-HEMACUE: Hemoglobin: 8.8 g/dL — ABNORMAL LOW (ref 12.0–15.0)

## 2017-01-31 MED ORDER — ACETAMINOPHEN 325 MG PO TABS
650.0000 mg | ORAL_TABLET | Freq: Once | ORAL | Status: AC
  Administered 2017-01-31: 10:00:00 650 mg via ORAL

## 2017-01-31 MED ORDER — DARBEPOETIN ALFA 100 MCG/0.5ML IJ SOSY
PREFILLED_SYRINGE | INTRAMUSCULAR | Status: AC
  Filled 2017-01-31: qty 0.5

## 2017-01-31 MED ORDER — ACETAMINOPHEN 325 MG PO TABS
ORAL_TABLET | ORAL | Status: AC
  Filled 2017-01-31: qty 2

## 2017-01-31 MED ORDER — DIPHENHYDRAMINE HCL 25 MG PO CAPS
ORAL_CAPSULE | ORAL | Status: AC
  Filled 2017-01-31: qty 2

## 2017-01-31 MED ORDER — RITUXIMAB CHEMO INJECTION 500 MG/50ML
800.0000 mg | Freq: Once | INTRAVENOUS | Status: AC
  Administered 2017-01-31: 10:00:00 800 mg via INTRAVENOUS
  Filled 2017-01-31 (×2): qty 80

## 2017-01-31 MED ORDER — SODIUM CHLORIDE 0.9 % IV SOLN: INTRAVENOUS | Status: DC

## 2017-01-31 MED ORDER — DARBEPOETIN ALFA 100 MCG/0.5ML IJ SOSY
100.0000 ug | PREFILLED_SYRINGE | INTRAMUSCULAR | Status: DC
  Administered 2017-01-31: 10:00:00 100 ug via SUBCUTANEOUS

## 2017-01-31 MED ORDER — DIPHENHYDRAMINE HCL 25 MG PO TABS
50.0000 mg | ORAL_TABLET | Freq: Once | ORAL | Status: DC
  Filled 2017-01-31: qty 2

## 2017-02-11 ENCOUNTER — Ambulatory Visit (INDEPENDENT_AMBULATORY_CARE_PROVIDER_SITE_OTHER): Payer: Medicaid Other | Admitting: Internal Medicine

## 2017-02-11 ENCOUNTER — Encounter: Payer: Self-pay | Admitting: Internal Medicine

## 2017-02-11 ENCOUNTER — Telehealth: Payer: Self-pay | Admitting: Internal Medicine

## 2017-02-11 VITALS — BP 170/100 | HR 60 | Temp 98.3°F | Ht 67.5 in | Wt 203.0 lb

## 2017-02-11 DIAGNOSIS — N184 Chronic kidney disease, stage 4 (severe): Secondary | ICD-10-CM

## 2017-02-11 DIAGNOSIS — Z0000 Encounter for general adult medical examination without abnormal findings: Secondary | ICD-10-CM | POA: Diagnosis not present

## 2017-02-11 DIAGNOSIS — Z1211 Encounter for screening for malignant neoplasm of colon: Secondary | ICD-10-CM

## 2017-02-11 DIAGNOSIS — E119 Type 2 diabetes mellitus without complications: Secondary | ICD-10-CM | POA: Diagnosis present

## 2017-02-11 DIAGNOSIS — I10 Essential (primary) hypertension: Secondary | ICD-10-CM

## 2017-02-11 DIAGNOSIS — Z794 Long term (current) use of insulin: Secondary | ICD-10-CM

## 2017-02-11 HISTORY — DX: Chronic kidney disease, stage 4 (severe): N18.4

## 2017-02-11 HISTORY — DX: Long term (current) use of insulin: Z79.4

## 2017-02-11 LAB — POCT GLYCOSYLATED HEMOGLOBIN (HGB A1C): Hemoglobin A1C: 6.7

## 2017-02-11 MED ORDER — GLUCOSE BLOOD VI STRP: 1.0000 | ORAL_STRIP | Freq: Three times a day (TID) | 12 refills | Status: DC

## 2017-02-11 MED ORDER — ASPIRIN EC 81 MG PO TBEC: 81.0000 mg | DELAYED_RELEASE_TABLET | Freq: Every day | ORAL | 0 refills | Status: DC

## 2017-02-11 MED ORDER — HYDRALAZINE HCL 25 MG PO TABS
50.0000 mg | ORAL_TABLET | Freq: Three times a day (TID) | ORAL | 1 refills | Status: DC
Start: 2017-02-11 — End: 2017-03-25

## 2017-02-11 MED ORDER — GLUCOSE BLOOD VI STRP: ORAL_STRIP | 12 refills | Status: DC

## 2017-02-11 MED ORDER — PEN NEEDLES 31G X 5 MM MISC: 1.0000 [IU] | Freq: Three times a day (TID) | 0 refills | Status: DC

## 2017-02-11 MED ORDER — HYDRALAZINE HCL 25 MG PO TABS: 100.0000 mg | ORAL_TABLET | Freq: Three times a day (TID) | ORAL | 1 refills | Status: DC

## 2017-02-11 MED ORDER — INSULIN GLARGINE 100 UNIT/ML SOLOSTAR PEN: 5.0000 [IU] | PEN_INJECTOR | Freq: Every day | SUBCUTANEOUS | 99 refills | Status: DC

## 2017-02-11 NOTE — Patient Instructions (Addendum)
It was so nice to meet you!  Your blood pressure was high today. We will increase your Hydralazine to 50mg  (2 tablets) three times per day. Please come back to nursing clinic in 2 weeks for a blood pressure check.  We have started you on a long acting insulin called Lantus. Please use 5 units at bedtime. Please continue to check your blood sugar 2-3 times per day.  I have sent in a referral for you to have a colonoscopy. Our office will call you to schedule this appointment. Please call to schedule a mammogram.  -Dr. Brett Albino

## 2017-02-11 NOTE — Assessment & Plan Note (Signed)
Uncontrolled. BP 170/100 in clinic today. - Increase Hydralazine from 25mg  tid to 50mg  tid - Follow-up for blood pressure check in 2 weeks - Follow-up in 1 month for HTN visit

## 2017-02-11 NOTE — Assessment & Plan Note (Signed)
Well-controlled. No longer taking Metformin due to renal insufficiency. Currently taking Novolog 4 units prn for blood sugars >200. - A1c checked in clinic today and was 6.7%. - Change Novolog to Lantus 5 units daily - Refill sent for test strips - Continue checking blood sugars 2-3 times per day and write them down for next visit - Follow-up in 1 month for diabetes visit. Plan for foot exam at that visit.

## 2017-02-11 NOTE — Progress Notes (Signed)
ROI signed and faxed to [facility] per pt request.  Contact Information Phone: [phone]  Office Fax: [phone]  Address: [facility], [place] [address] [place], [place] [address]

## 2017-02-11 NOTE — Assessment & Plan Note (Signed)
-   GI referral placed for colonoscopy - Will work on other health care maintenance items at next visit.

## 2017-02-11 NOTE — Telephone Encounter (Signed)
lmtcb x1 for pt's daughter. I do not see where we have called pt.

## 2017-02-11 NOTE — Progress Notes (Signed)
   [facility] Phone: [phone]  Subjective:  Catherine [facility] is a 61 year old female presenting to clinic for a new patient appointment.  T2DM: Checking blood sugars 2-3 times per day. Have been ranging from 99-180. No lows. Has been taking Novolog 4 units whenever her blood sugar is >200. Was previously taking Metformin, but this was stopped in the hospital due to acute renal failure. No dizziness, no sweatiness, no shakiness.   HTN: Taking Hydralazine 25mg  tid. No side effects to medication. No chest pain, no shortness of breath, no lower extremity edema, no dizziness.   ROS: See HPI for pertinent positives and negatives  Past Medical History- HTN, hx CVA in 2013, T2DM, Wegener's granulomatosis w/ history of intra-alveolar hemorrhage, CKD IV, HLD  Past Surgical History- None  Medications- Novolog 4 units prn for blood sugars >200, Hydralazine 25mg  tid, Lasix 80mg  daily, Pravastatin 40mg  daily, Prednisone 20mg  tid, Bactrim 800-160mg  tid, nicotine patch.  Allergies- no known allergies  Family history: Father- alcohol/drug abuse, diabetes, HTN Mother- diabetes, HTN, stroke  Social history- patient is a former smoker. Quit 6 months ago. Currently using marijuana.  Objective: BP (!) 170/100   Pulse 60   Temp 98.3 F (36.8 C) (Oral)   Ht 5' 7.5" (1.715 m)   Wt 203 lb (92.1 kg)   SpO2 96%   BMI 31.33 kg/m  Gen: NAD, alert, cooperative with exam HEENT: NCAT, EOMI, MMM Neck: FROM, supple CV: RRR, no murmur Resp: CTABL, no wheezes, normal work of breathing GI: SNTND, BS present, no guarding or organomegaly Msk: No edema, warm, normal tone, moves UE/LE spontaneously Neuro: Alert and oriented, no gross deficits Skin: No rashes, no lesions Psych: Appropriate behavior  Assessment/Plan: HTN: Uncontrolled. BP 170/100 in clinic today. - Increase Hydralazine from 25mg  tid to 50mg  tid - Follow-up for blood pressure check in 2 weeks - Follow-up in 1 month for HTN  visit  T2DM: Well-controlled. No longer taking Metformin due to renal insufficiency. Currently taking Novolog 4 units prn for blood sugars >200. - A1c checked in clinic today and was 6.7%. - Change Novolog to Lantus 5 units daily - Refill sent for test strips - Continue checking blood sugars 2-3 times per day and write them down for next visit - Follow-up in 1 month for diabetes visit. Plan for foot exam at that visit.  Health Care Maintenance: - GI referral placed for colonoscopy - Will work on other health care maintenance items at next visit.   Hyman Bible, MD PGY-2

## 2017-02-12 ENCOUNTER — Other Ambulatory Visit (INDEPENDENT_AMBULATORY_CARE_PROVIDER_SITE_OTHER): Payer: Medicaid Other

## 2017-02-12 ENCOUNTER — Ambulatory Visit (INDEPENDENT_AMBULATORY_CARE_PROVIDER_SITE_OTHER): Payer: Medicaid Other | Admitting: Internal Medicine

## 2017-02-12 ENCOUNTER — Encounter: Payer: Self-pay | Admitting: Internal Medicine

## 2017-02-12 DIAGNOSIS — M318 Other specified necrotizing vasculopathies: Secondary | ICD-10-CM

## 2017-02-12 LAB — CBC WITH DIFFERENTIAL/PLATELET
BASOS ABS: 0.1 10*3/uL (ref 0.0–0.1)
Basophils Relative: 0.5 % (ref 0.0–3.0)
EOS PCT: 0.1 % (ref 0.0–5.0)
Eosinophils Absolute: 0 10*3/uL (ref 0.0–0.7)
HEMATOCRIT: 31.7 % — AB (ref 36.0–46.0)
Hemoglobin: 10 g/dL — ABNORMAL LOW (ref 12.0–15.0)
LYMPHS PCT: 9.8 % — AB (ref 12.0–46.0)
Lymphs Abs: 1 10*3/uL (ref 0.7–4.0)
MCHC: 31.4 g/dL (ref 30.0–36.0)
MCV: 82.1 fl (ref 78.0–100.0)
MONOS PCT: 2.2 % — AB (ref 3.0–12.0)
Monocytes Absolute: 0.2 10*3/uL (ref 0.1–1.0)
Neutro Abs: 8.7 10*3/uL — ABNORMAL HIGH (ref 1.4–7.7)
Neutrophils Relative %: 87.4 % — ABNORMAL HIGH (ref 43.0–77.0)
Platelets: 273 10*3/uL (ref 150.0–400.0)
RBC: 3.86 Mil/uL — AB (ref 3.87–5.11)
RDW: 20.4 % — ABNORMAL HIGH (ref 11.5–15.5)
WBC: 9.9 10*3/uL (ref 4.0–10.5)

## 2017-02-12 LAB — BASIC METABOLIC PANEL
BUN: 57 mg/dL — AB (ref 6–23)
CHLORIDE: 111 meq/L (ref 96–112)
CO2: 21 meq/L (ref 19–32)
CREATININE: 3.26 mg/dL — AB (ref 0.40–1.20)
Calcium: 9.3 mg/dL (ref 8.4–10.5)
GFR: 18.48 mL/min — ABNORMAL LOW (ref >60.00)
Glucose, Bld: 173 mg/dL — ABNORMAL HIGH (ref 70–99)
Potassium: 4.3 mEq/L (ref 3.5–5.1)
Sodium: 142 mEq/L (ref 135–145)

## 2017-02-12 MED ORDER — SULFAMETHOXAZOLE-TRIMETHOPRIM 800-160 MG PO TABS: 1.0000 | ORAL_TABLET | ORAL | 5 refills | Status: DC

## 2017-02-12 MED ORDER — PREDNISONE 10 MG PO TABS: ORAL_TABLET | ORAL | 1 refills | Status: DC

## 2017-02-12 NOTE — Patient Instructions (Addendum)
Systemic vasculitis syndrome ([name]) Improved clinically  Plan Do cbc with diff, and bmet 02/12/2017 Start bactrim 1DS Monday, wed, Friday - for infection prevention Reduce prednisone   - 40mg  daily x for rest of June 2018  - 20mg  daily x for July 2018  - 10mg  daily x starting august 2018 to continue  Followup  - 2-3 months from now

## 2017-02-12 NOTE — Progress Notes (Signed)
Subjective:     Patient ID: Catherine [facility], female   DOB: 05/23/1956, 61 y.o.   MRN: [id number]  HPI   OV 02/12/2017  Chief Complaint  Patient presents with  . Follow-up    Pt states her breathing is doing well. Pt denies any cough, SOB, CP/tightness, and f/c/s.    Follow-up severe mid vasculitis syndrome with life-threatening alveolar hemorrhage and acute renal failure is easter weekend 2018  Currently she is doing well. She has finished 4 doses of Rituxan. Last dose was 01/31/2017. There was significant delay between the first 2 doses which were on schedule in the third and fourth doses delayed. Still on prednisone 60 mg per day. She is accompanied by her daughter [name]. Overall she's doing well. This no hemoptysis anymore. This no shortness of breath. Her G6PD was normal in May 2018. Her Quantiferon Gold was normal. Her most recent chemistries were in May 2018 and a creatinine that improved with 3 mg percent approximately.      has a past medical history of High cholesterol; Hypertension; Stroke [facility]); and Type 2 diabetes mellitus without complication, with long-term current use of insulin ([place]) (02/11/2017).   reports that she quit smoking about 2 months ago. Her smoking use included Cigarettes. She has a 20.00 pack-year smoking history. She has never used smokeless tobacco.  Past Surgical History:  Procedure Laterality Date  . ABDOMINAL HYSTERECTOMY  1985    No Known Allergies  Immunization History  Administered Date(s) Administered  . Influenza Split 07/02/2012  . Pneumococcal Polysaccharide-23 07/02/2012    Family History  Problem Relation Age of Onset  . Cancer Father   . Alcohol abuse Father   . Diabetes Father   . Hypertension Father   . Heart attack Mother   . Diabetes Mother   . Hypertension Mother   . Stroke Mother      Current Outpatient Prescriptions:  .  aspirin EC 81 MG tablet, Take 1 tablet (81 mg total) by mouth daily., Disp: 90 tablet, Rfl:  0 .  blood glucose meter kit and supplies KIT, Dispense based on patient and insurance preference. Use up to four times daily as directed. (FOR ICD-9 250.00, 250.01)., Disp: 1 each, Rfl: 0 .  furosemide (LASIX) 80 MG tablet, Take 1 tablet (80 mg total) by mouth daily., Disp: 30 tablet, Rfl: 0 .  glucose blood test strip, 1 each by Other route 3 (three) times daily. Use as instructed, Disp: 100 each, Rfl: 12 .  hydrALAZINE (APRESOLINE) 25 MG tablet, Take 2 tablets (50 mg total) by mouth 3 (three) times daily., Disp: 120 tablet, Rfl: 1 .  Insulin Glargine (LANTUS SOLOSTAR) 100 UNIT/ML Solostar Pen, Inject 5 Units into the skin daily at 10 pm., Disp: 5 pen, Rfl: PRN .  Insulin Pen Needle (PEN NEEDLES) 31G X 5 MM MISC, 1 Units by Does not apply route 3 (three) times daily., Disp: 90 each, Rfl: 0 .  pravastatin (PRAVACHOL) 40 MG tablet, Take 1 tablet (40 mg total) by mouth daily. For elevated cholesterol, Disp: 30 tablet, Rfl: 1 .  predniSONE (DELTASONE) 20 MG tablet, Take 3 tablets (60 mg total) by mouth daily with breakfast., Disp: 30 tablet, Rfl: 0 .  insulin aspart (NOVOLOG) 100 UNIT/ML FlexPen, Check your blood sugar three times daily before meals and use the following sliding scale depending on your blood sugar.  CBG 70 - 120: 0 units  CBG 121 - 150: 1 unit  CBG 151 - 200: 2 units  CBG 201 - 250: 3 units  CBG 251 - 300: 5 units  CBG 301 - 350: 7 units  CBG 351 - 400: 9 units (Patient not taking: Reported on 02/12/2017), Disp: 15 mL, Rfl: 0   Review of Systems     Objective:   Physical Exam  Constitutional: She is oriented to person, place, and time. She appears well-developed and well-nourished. No distress.  cushingoid  HENT:  Head: Normocephalic and atraumatic.  Right Ear: External ear normal.  Left Ear: External ear normal.  Mouth/Throat: Oropharynx is clear and moist. No oropharyngeal exudate.  Eyes: Conjunctivae and EOM are normal. Pupils are equal, round, and reactive to light. Right eye  exhibits no discharge. Left eye exhibits no discharge. No scleral icterus.  Neck: Normal range of motion. Neck supple. No JVD present. No tracheal deviation present. No thyromegaly present.  Cardiovascular: Normal rate, regular rhythm, normal heart sounds and intact distal pulses.  Exam reveals no gallop and no friction rub.   No murmur heard. Pulmonary/Chest: Effort normal and breath sounds normal. No respiratory distress. She has no wheezes. She has no rales. She exhibits no tenderness.  Abdominal: Soft. Bowel sounds are normal. She exhibits no distension and no mass. There is no tenderness. There is no rebound and no guarding.  Musculoskeletal: Normal range of motion. She exhibits no edema or tenderness.  Lymphadenopathy:    She has no cervical adenopathy.  Neurological: She is alert and oriented to person, place, and time. She has normal reflexes. No cranial nerve deficit. She exhibits normal muscle tone. Coordination normal.  Skin: Skin is warm and dry. No rash noted. She is not diaphoretic. No erythema. No pallor.  Psychiatric: She has a normal mood and affect. Her behavior is normal. Judgment and thought content normal.  Vitals reviewed.  Vitals:   02/12/17 1027  BP: (!) 162/102  Pulse: 70  SpO2: 97%  Weight: 202 lb (91.6 kg)  Height: 5' 7.5" (1.715 m)    Estimated body mass index is 31.17 kg/m as calculated from the following:   Height as of this encounter: 5' 7.5" (1.715 m).   Weight as of this encounter: 202 lb (91.6 kg).      Assessment:       ICD-9-CM ICD-10-CM   1. Systemic vasculitis syndrome ([place]) 447.6 M31.8        Plan:     Systemic vasculitis syndrome ([place]) Improved clinically  Plan Do cbc with diff, and bmet 02/12/2017 Start bactrim 1DS Monday, wed, Friday - for infection prevention Reduce prednisone   - 36m daily x for rest of June 2018  - 225mdaily x for July 2018  - 104maily x starting august 2018 to continue  Followup  - 2-3 months from  now    > 50% of this > 25 min visit spent in face to face counseling or coordination of care    Dr. MurBrand Males.D., F.[facility] Center[facility] and Critical [facility] Medicine Staff Physician [facility] Pager: [phone] no answer or between  15:00h - 7:00h: call [phone]  02/12/2017 10:53 AM

## 2017-02-12 NOTE — Telephone Encounter (Signed)
Will close this message as the patient was seen by MR today.

## 2017-02-12 NOTE — Assessment & Plan Note (Signed)
Improved clinically  Plan Do cbc with diff, and bmet 02/12/2017 Start bactrim 1DS Monday, wed, Friday - for infection prevention Reduce prednisone   - 40mg  daily x for rest of June 2018  - 20mg  daily x for July 2018  - 10mg  daily x starting august 2018 to continue  Followup  - 2-3 months from now

## 2017-02-14 NOTE — Progress Notes (Signed)
Called and spoke to pt's daughter,Zarana, on dpr. Informed her of the results and recs per MR. She verbalized understanding and denied any further questions or concerns at this time.

## 2017-02-21 ENCOUNTER — Other Ambulatory Visit: Payer: Self-pay | Admitting: Internal Medicine

## 2017-02-21 DIAGNOSIS — Z1231 Encounter for screening mammogram for malignant neoplasm of breast: Secondary | ICD-10-CM

## 2017-02-22 ENCOUNTER — Ambulatory Visit: Payer: Medicaid Other

## 2017-02-28 ENCOUNTER — Encounter (HOSPITAL_COMMUNITY)
Admission: RE | Admit: 2017-02-28 | Discharge: 2017-02-28 | Disposition: A | Discharge status: Home / Self Care | Payer: Medicaid Other | Source: Ambulatory Visit | Attending: Internal Medicine | Admitting: Internal Medicine

## 2017-02-28 DIAGNOSIS — I776 Arteritis, unspecified: Secondary | ICD-10-CM | POA: Insufficient documentation

## 2017-02-28 DIAGNOSIS — N170 Acute kidney failure with tubular necrosis: Secondary | ICD-10-CM

## 2017-02-28 LAB — IRON AND TIBC
IRON: 48 ug/dL (ref 28–170)
Saturation Ratios: 15 % (ref 10.4–31.8)
TIBC: 315 ug/dL (ref 250–450)
UIBC: 267 ug/dL

## 2017-02-28 LAB — FERRITIN: Ferritin: 110 ng/mL (ref 11–307)

## 2017-02-28 LAB — POCT HEMOGLOBIN-HEMACUE: Hemoglobin: 9.9 g/dL — ABNORMAL LOW (ref 12.0–15.0)

## 2017-02-28 MED ORDER — DARBEPOETIN ALFA 150 MCG/0.3ML IJ SOSY
PREFILLED_SYRINGE | INTRAMUSCULAR | Status: AC
  Filled 2017-02-28: qty 0.3

## 2017-02-28 MED ORDER — DARBEPOETIN ALFA 150 MCG/0.3ML IJ SOSY
150.0000 ug | PREFILLED_SYRINGE | INTRAMUSCULAR | Status: DC
  Administered 2017-02-28: 150 ug via SUBCUTANEOUS

## 2017-03-01 ENCOUNTER — Other Ambulatory Visit: Payer: Self-pay | Admitting: Internal Medicine

## 2017-03-14 ENCOUNTER — Ambulatory Visit
Admission: RE | Admit: 2017-03-14 | Discharge: 2017-03-14 | Disposition: A | Discharge status: Home / Self Care | Payer: Medicaid Other | Source: Ambulatory Visit | Attending: Family Medicine | Admitting: Family Medicine

## 2017-03-14 ENCOUNTER — Other Ambulatory Visit (HOSPITAL_COMMUNITY): Payer: Self-pay | Admitting: *Deleted

## 2017-03-14 DIAGNOSIS — Z1231 Encounter for screening mammogram for malignant neoplasm of breast: Secondary | ICD-10-CM

## 2017-03-15 ENCOUNTER — Other Ambulatory Visit: Payer: Self-pay | Admitting: Family Medicine

## 2017-03-15 ENCOUNTER — Encounter (HOSPITAL_COMMUNITY)
Admission: RE | Admit: 2017-03-15 | Discharge: 2017-03-15 | Disposition: A | Discharge status: Home / Self Care | Payer: Medicaid Other | Source: Ambulatory Visit | Attending: Internal Medicine | Admitting: Internal Medicine

## 2017-03-15 ENCOUNTER — Telehealth: Payer: Self-pay | Admitting: Student

## 2017-03-15 DIAGNOSIS — I776 Arteritis, unspecified: Secondary | ICD-10-CM | POA: Insufficient documentation

## 2017-03-15 DIAGNOSIS — R928 Other abnormal and inconclusive findings on diagnostic imaging of breast: Secondary | ICD-10-CM

## 2017-03-15 MED ORDER — SODIUM CHLORIDE 0.9 % IV SOLN
510.0000 mg | Freq: Once | INTRAVENOUS | Status: AC
  Administered 2017-03-15: 510 mg via INTRAVENOUS
  Filled 2017-03-15: qty 17

## 2017-03-15 NOTE — Discharge Instructions (Signed)

## 2017-03-15 NOTE — Telephone Encounter (Signed)
Called and talked to patient about her mammogram result and to ensure that she has been contacted about the result and follow up. She confirms this and says she will be back to imaging center on 03/20/2017 at 2 pm.  Babs Dabbs ([place] [facility] [place])

## 2017-03-18 ENCOUNTER — Telehealth: Payer: Self-pay | Admitting: *Deleted

## 2017-03-18 NOTE — Telephone Encounter (Signed)
Dr Carlean Purl: pt is scheduled for direct screening colonoscopy 8/1.  Pt has complicated medical history and was hospitalized for severe mid vasculitis syndrome with life-threatening alveolar hemorrhage and acute renal failure  easter weekend 2018.  Please review chat.  Does she need Ov before scheduling colonoscopy?  Thanks, Juliann Pulse

## 2017-03-18 NOTE — Telephone Encounter (Signed)
noted 

## 2017-03-18 NOTE — Telephone Encounter (Signed)
Chart reviewed - I dop not see any reason why she needs OV OK for direct

## 2017-03-20 ENCOUNTER — Ambulatory Visit
Admission: RE | Admit: 2017-03-20 | Discharge: 2017-03-20 | Disposition: A | Discharge status: Home / Self Care | Payer: Medicaid Other | Source: Ambulatory Visit | Attending: Family Medicine | Admitting: Family Medicine

## 2017-03-20 DIAGNOSIS — R928 Other abnormal and inconclusive findings on diagnostic imaging of breast: Secondary | ICD-10-CM

## 2017-03-25 ENCOUNTER — Encounter: Payer: Self-pay | Admitting: Internal Medicine

## 2017-03-25 ENCOUNTER — Ambulatory Visit (INDEPENDENT_AMBULATORY_CARE_PROVIDER_SITE_OTHER): Payer: Medicaid Other | Admitting: Internal Medicine

## 2017-03-25 ENCOUNTER — Telehealth: Payer: Self-pay

## 2017-03-25 VITALS — BP 168/90 | HR 92 | Temp 98.7°F | Wt 202.0 lb

## 2017-03-25 DIAGNOSIS — I10 Essential (primary) hypertension: Secondary | ICD-10-CM

## 2017-03-25 DIAGNOSIS — E119 Type 2 diabetes mellitus without complications: Secondary | ICD-10-CM | POA: Diagnosis not present

## 2017-03-25 DIAGNOSIS — Z0000 Encounter for general adult medical examination without abnormal findings: Secondary | ICD-10-CM

## 2017-03-25 DIAGNOSIS — R600 Localized edema: Secondary | ICD-10-CM

## 2017-03-25 DIAGNOSIS — Z794 Long term (current) use of insulin: Secondary | ICD-10-CM

## 2017-03-25 HISTORY — DX: Localized edema: R60.0

## 2017-03-25 LAB — POCT UA - MICROALBUMIN
Creatinine, POC: 50 mg/dL
Microalbumin Ur, POC: 150 mg/L

## 2017-03-25 MED ORDER — HYDRALAZINE HCL 25 MG PO TABS: 75.0000 mg | ORAL_TABLET | Freq: Three times a day (TID) | ORAL | 1 refills | Status: DC

## 2017-03-25 NOTE — Patient Instructions (Addendum)
It was so nice to see you today!  I have ordered an ultrasound of your right leg to make sure you don't have a blood clot.  Please increase your Hydralazine from 2 tablets three times a day to 3 tablets three times a day. Please come back to nurse clinic in 1-2 weeks for a blood pressure check.  I have sent in a referral to ophthalmology for your diabetic eye exam. You should hear from our office in 2 weeks to schedule this appointment.  -Dr. Brett Albino

## 2017-03-25 NOTE — Progress Notes (Signed)
   [facility] Phone: [phone]  Subjective:  Catherine Aguilar is a 61 year old female presenting to clinic for follow-up of her hypertension and diabetes. 2 tonsillectomy discuss right lower extremity edema.  HTN:  Taking hydralazine 50 mg 3 times a day and Lasix 80 mg daily. Tolerating medications without any issues. No chest pain, no shortness of breath.  T2DM: Checking CBGs 2-3 times per day. They have been running from 83-173. Mostly in the 120s to 160s. Denies any shakiness or sweatiness with low blood sugars. Taking Lantus 5 units daily.   Right Lower Extremity Edema: Has been going on for the last couple of weeks. She also has associated pain of the right lower extremity. She says she has pain throughout the entire lower extremity. The pain is "sharp". She denies any recent long car trips or plane trips. No recent surgeries. No trauma. No history of blood clots. She does have a family history of blood clots in her mother. She does note numbness in her right foot, but this is normal for her after her stroke.  ROS: See HPI for pertinent positives and negatives  Past Medical History- hypertension, history of CVA in 2013, type 2 diabetes, Wegener's granulomatosis with history of intra-alveolar hemorrhage, CKD VI, hyperlipidemia  Family history- mother with history of blood clot in her leg.  Social history- patient is a current smoker  Objective: BP (!) 168/90   Pulse 92   Temp 98.7 F (37.1 C) (Oral)   Wt 202 lb (91.6 kg)   SpO2 96%   BMI 31.17 kg/m  Gen: NAD, alert, cooperative with exam HEENT: NCAT, EOMI, MMM Neck: FROM, supple CV: RRR, no murmur Resp: CTABL, no wheezes, normal work of breathing Msk: 1+ pitting edema up to the mid shin in the right lower extremity. +Tenderness to palpation of the right calf. No palpable cords appreciated. Diabetic foot exam: No deformities, no ulcerations, no other skin breakdown bilaterally. Intact to touch and monofilament  testing in the left foot. Decreased sensation to monofilament testing throughout the entire right foot. PT and DP pulses intact bilaterally.  Assessment/Plan: HTN: Uncontrolled. Blood pressure 168/90 in clinic today.  - Increase hydralazine from 50 mg tid to 75 mg tid - Follow up in nurse clinic in 2 weeks for blood pressure check - Follow up with me in 1-2 months for hypertension visit - Will likely need to add additional medication. Likely Norvasc.  T2DM: Well controlled. Last A1c 02/11/17 was 6.7%.  - Continue Lantus 5 units daily. - Continue checking blood sugars 2-3 times per day - Urine microalbumin ordered - Diabetic foot exam performed today - Referral placed to ophthalmology for annual eye exam - Follow up in 1-2 months for diabetes visit. Will recheck A1c at that time.  Right Lower Extremity Edema: Has been going on for the last couple of weeks. She does have mild unilateral edema. Also having tenderness to palpation of her right calf. No risk factors for DVT, other than positive family history. - Will order duplex ultrasound to rule out DVT  Health Care Maintenance: - Colonoscopy scheduled for 04/10/17 - Return for pap   Hyman Bible, MD PGY-3

## 2017-03-25 NOTE — Assessment & Plan Note (Signed)
Well controlled. Last A1c 02/11/17 was 6.7%.  - Continue Lantus 5 units daily. - Continue checking blood sugars 2-3 times per day - Urine microalbumin ordered - Diabetic foot exam performed today - Referral placed to ophthalmology for annual eye exam - Follow up in 1-2 months for diabetes visit. Will recheck A1c at that time.

## 2017-03-25 NOTE — Telephone Encounter (Signed)
Pt contacted and informed of [place] scheduled for 7/18 @9am  over at [facility]. Pt verbalized understanding.

## 2017-03-25 NOTE — Assessment & Plan Note (Signed)
Uncontrolled. Blood pressure 168/90 in clinic today.  - Increase hydralazine from 50 mg tid to 75 mg tid - Follow up in nurse clinic in 2 weeks for blood pressure check - Follow up with me in 1-2 months for hypertension visit - Will likely need to add additional medication. Likely Norvasc.

## 2017-03-25 NOTE — Assessment & Plan Note (Signed)
-   Colonoscopy scheduled for 04/10/17 - Return for pap

## 2017-03-25 NOTE — Assessment & Plan Note (Signed)
Has been going on for the last couple of weeks. She does have mild unilateral edema. Also having tenderness to palpation of her right calf. No risk factors for DVT, other than positive family history. - Will order duplex ultrasound to rule out DVT

## 2017-03-26 ENCOUNTER — Ambulatory Visit (AMBULATORY_SURGERY_CENTER): Payer: Self-pay

## 2017-03-26 ENCOUNTER — Ambulatory Visit (HOSPITAL_COMMUNITY): Payer: Medicaid Other

## 2017-03-26 VITALS — Ht 67.0 in | Wt 207.6 lb

## 2017-03-26 DIAGNOSIS — Z1211 Encounter for screening for malignant neoplasm of colon: Secondary | ICD-10-CM

## 2017-03-26 MED ORDER — NA SULFATE-K SULFATE-MG SULF 17.5-3.13-1.6 GM/177ML PO SOLN: 1.0000 | Freq: Once | ORAL | 0 refills | Status: AC

## 2017-03-26 NOTE — Progress Notes (Signed)
Denies allergies to eggs or soy products. Denies complication of anesthesia or sedation. Denies use of weight loss medication. Denies use of O2.   Emmi instructions declined.  

## 2017-03-27 ENCOUNTER — Ambulatory Visit (HOSPITAL_COMMUNITY)
Admission: RE | Admit: 2017-03-27 | Discharge: 2017-03-27 | Disposition: A | Discharge status: Home / Self Care | Payer: Medicaid Other | Source: Ambulatory Visit | Attending: Family Medicine | Admitting: Family Medicine

## 2017-03-27 DIAGNOSIS — R600 Localized edema: Secondary | ICD-10-CM | POA: Insufficient documentation

## 2017-03-27 NOTE — Progress Notes (Signed)
Please let Catherine Aguilar know that her [place] did not show any signs of a blood clot. Thank you!

## 2017-03-27 NOTE — Progress Notes (Addendum)
*  Preliminary Results* Right lower extremity venous duplex completed. Right lower extremity is negative for deep vein thrombosis. There is no evidence of right Baker's cyst.  Attempted to call results to [phone], however there was no answer. The patient will be discharged and can be reached by phone if necessary.  03/27/2017 10:52 AM  Maudry Mayhew, BS, RVT, RDCS, RDMS

## 2017-03-28 ENCOUNTER — Encounter (HOSPITAL_COMMUNITY): Payer: Medicaid Other

## 2017-03-28 NOTE — Progress Notes (Signed)
LM for patient to call back so we can confirm she is aware of results. Kyren Knick,CMA

## 2017-03-28 NOTE — Progress Notes (Signed)
I had already forwarded a result note to [facility] to let her know it was negative. I think Page already told her.

## 2017-03-29 NOTE — Progress Notes (Signed)
Patient and daughter are aware. Jazmin Hartsell,CMA

## 2017-04-03 ENCOUNTER — Encounter (HOSPITAL_COMMUNITY)
Admission: RE | Admit: 2017-04-03 | Discharge: 2017-04-03 | Disposition: A | Discharge status: Home / Self Care | Payer: Medicaid Other | Source: Ambulatory Visit | Attending: Nephrology | Admitting: Nephrology

## 2017-04-03 DIAGNOSIS — N170 Acute kidney failure with tubular necrosis: Secondary | ICD-10-CM

## 2017-04-03 DIAGNOSIS — I776 Arteritis, unspecified: Secondary | ICD-10-CM | POA: Diagnosis not present

## 2017-04-03 LAB — IRON AND TIBC
Iron: 43 ug/dL (ref 28–170)
Saturation Ratios: 15 % (ref 10.4–31.8)
TIBC: 286 ug/dL (ref 250–450)
UIBC: 243 ug/dL

## 2017-04-03 LAB — POCT HEMOGLOBIN-HEMACUE: Hemoglobin: 11.7 g/dL — ABNORMAL LOW (ref 12.0–15.0)

## 2017-04-03 LAB — FERRITIN: FERRITIN: 216 ng/mL (ref 11–307)

## 2017-04-03 MED ORDER — DARBEPOETIN ALFA 150 MCG/0.3ML IJ SOSY: 150.0000 ug | PREFILLED_SYRINGE | INTRAMUSCULAR | Status: DC

## 2017-04-03 MED ORDER — CLONIDINE HCL 0.1 MG PO TABS
ORAL_TABLET | ORAL | Status: AC
  Administered 2017-04-03: 13:00:00 0.1 mg
  Filled 2017-04-03: qty 1

## 2017-04-03 NOTE — Progress Notes (Signed)
PTs BP elevated and remained elevated after clonidine given.  Called and spoke with [place] at [facility].  Orders received that if hgb >10.5 to hold injection and give it if less than 10.5.  Pt was 11.7 today so shot was held.  Doristine Bosworth also stated to tell the pt to call her when she gets home and is by her medications so they can go over them together and that she is going to call her in another prescription for a new BP med.  Pt verbalized understanding and I gave her the number to [place] [facility] to take with her.

## 2017-04-04 ENCOUNTER — Other Ambulatory Visit: Payer: Self-pay | Admitting: Internal Medicine

## 2017-04-10 ENCOUNTER — Ambulatory Visit (AMBULATORY_SURGERY_CENTER): Payer: Medicaid Other | Admitting: Internal Medicine

## 2017-04-10 VITALS — BP 168/102 | HR 54 | Temp 98.7°F | Resp 20 | Ht 67.0 in | Wt 207.0 lb

## 2017-04-10 DIAGNOSIS — D128 Benign neoplasm of rectum: Secondary | ICD-10-CM

## 2017-04-10 DIAGNOSIS — K635 Polyp of colon: Secondary | ICD-10-CM

## 2017-04-10 DIAGNOSIS — Z1211 Encounter for screening for malignant neoplasm of colon: Secondary | ICD-10-CM | POA: Diagnosis not present

## 2017-04-10 DIAGNOSIS — K621 Rectal polyp: Secondary | ICD-10-CM | POA: Diagnosis not present

## 2017-04-10 DIAGNOSIS — D123 Benign neoplasm of transverse colon: Secondary | ICD-10-CM

## 2017-04-10 DIAGNOSIS — Z1212 Encounter for screening for malignant neoplasm of rectum: Secondary | ICD-10-CM

## 2017-04-10 DIAGNOSIS — D129 Benign neoplasm of anus and anal canal: Secondary | ICD-10-CM

## 2017-04-10 MED ORDER — SODIUM CHLORIDE 0.9 % IV SOLN: 500.0000 mL | INTRAVENOUS | Status: AC

## 2017-04-10 NOTE — Progress Notes (Signed)
No problems noted in the recovery room. Maw  Pt very anxious and wanted to leave.  I explained to pt and her daughter, I would get her out in aproximatly 20 minutes.  maw

## 2017-04-10 NOTE — Progress Notes (Signed)
Pt's states no medical or surgical changes since previsit or office visit. 

## 2017-04-10 NOTE — Progress Notes (Signed)
Pt's blood pressure remained elevated through her time in the recovery room.170/93,196/94,168/102,174/108 .  Pt had no sx from the high pressures.  She was very anxious the entire time in the recovery room and wanted to "go home". These b/p were reported to Dr. Carlean Purl and Osvaldo Angst, CRNA.  Per Dr. Carlean Purl pt to be d/c to home.  He recommended pt take her b/p med and lasix ASAP when she gets home.  This was stressed to pt and her daughter.  The both stated that they understood and would take b/p meds.  No problems on discharge.  maw

## 2017-04-10 NOTE — Patient Instructions (Addendum)
I found and removed 4 polyps - all look benign. I will let you know pathology results and when to have another routine colonoscopy by mail and/or My Chart.  I appreciate the opportunity to care for you. Gatha Mayer, MD, FACG    YOU HAD AN ENDOSCOPIC PROCEDURE TODAY AT [facility]:   Refer to the procedure report that was given to you for any specific questions about what was found during the examination.  If the procedure report does not answer your questions, please call your gastroenterologist to clarify.  If you requested that your care partner not be given the details of your procedure findings, then the procedure report has been included in a sealed envelope for you to review at your convenience later.  YOU SHOULD EXPECT: Some feelings of bloating in the abdomen. Passage of more gas than usual.  Walking can help get rid of the air that was put into your GI tract during the procedure and reduce the bloating. If you had a lower endoscopy (such as a colonoscopy or flexible sigmoidoscopy) you may notice spotting of blood in your stool or on the toilet paper. If you underwent a bowel prep for your procedure, you may not have a normal bowel movement for a few days.  Please Note:  You might notice some irritation and congestion in your nose or some drainage.  This is from the oxygen used during your procedure.  There is no need for concern and it should clear up in a day or so.  SYMPTOMS TO REPORT IMMEDIATELY:   Following lower endoscopy (colonoscopy or flexible sigmoidoscopy):  Excessive amounts of blood in the stool  Significant tenderness or worsening of abdominal pains  Swelling of the abdomen that is new, acute  Fever of 100F or higher   For urgent or emergent issues, a gastroenterologist can be reached at any hour by calling [phone].   DIET:  We do recommend a small meal at first, but then you may proceed to your regular diet.  Drink plenty of  fluids but you should avoid alcoholic beverages for 24 hours.  ACTIVITY:  You should plan to take it easy for the rest of today and you should NOT DRIVE or use heavy machinery until tomorrow (because of the sedation medicines used during the test).    FOLLOW UP: Our staff will call the number listed on your records the next business day following your procedure to check on you and address any questions or concerns that you may have regarding the information given to you following your procedure. If we do not reach you, we will leave a message.  However, if you are feeling well and you are not experiencing any problems, there is no need to return our call.  We will assume that you have returned to your regular daily activities without incident.  If any biopsies were taken you will be contacted by phone or by letter within the next 1-3 weeks.  Please call us at [phone] if you have not heard about the biopsies in 3 weeks.    SIGNATURES/CONFIDENTIALITY: You and/or your care partner have signed paperwork which will be entered into your electronic medical record.  These signatures attest to the fact that that the information above on your After Visit Summary has been reviewed and is understood.  Full responsibility of the confidentiality of this discharge information lies with you and/or your care-partner.    Handout was given to your care  partner on polyps. Your blood sugar was 104 in the recovery room. You may resume your current medications today. Await biopsy results. Please call if any questions or concerns.

## 2017-04-10 NOTE — Progress Notes (Signed)
Called to room to assist during endoscopic procedure.  Patient ID and intended procedure confirmed with present staff. Received instructions for my participation in the procedure from the performing physician.  

## 2017-04-10 NOTE — Op Note (Signed)
[place] Patient Name: Catherine Aguilar Procedure Date: 04/10/2017 10:29 AM MRN: [id number] Endoscopist: Gatha Mayer , MD Age: 61 Referring MD:  Date of Birth: 06/27/1956 Gender: Female Account #: [id number] Procedure:                Colonoscopy Indications:              Screening for colorectal malignant neoplasm, This                            is the patient's first colonoscopy Medicines:                Propofol per Anesthesia, Monitored Anesthesia Care Procedure:                Pre-Anesthesia Assessment:                           - Prior to the procedure, a History and Physical                            was performed, and patient medications and                            allergies were reviewed. The patient's tolerance of                            previous anesthesia was also reviewed. The risks                            and benefits of the procedure and the sedation                            options and risks were discussed with the patient.                            All questions were answered, and informed consent                            was obtained. Prior Anticoagulants: The patient has                            taken no previous anticoagulant or antiplatelet                            agents. ASA Grade Assessment: III - A patient with                            severe systemic disease. After reviewing the risks                            and benefits, the patient was deemed in                            satisfactory condition to undergo the procedure.  After obtaining informed consent, the colonoscope                            was passed under direct vision. Throughout the                            procedure, the patient's blood pressure, pulse, and                            oxygen saturations were monitored continuously. The                            Colonoscope was introduced through the anus and     advanced to the the cecum, identified by                            appendiceal orifice and ileocecal valve. The                            colonoscopy was performed without difficulty. The                            patient tolerated the procedure well. The quality                            of the bowel preparation was good. The ileocecal                            valve, appendiceal orifice, and rectum were                            photographed. The bowel preparation used was                            Miralax. Scope In: 10:44:18 AM Scope Out: 11:03:07 AM Scope Withdrawal Time: 0 hours 14 minutes 53 seconds  Total Procedure Duration: 0 hours 18 minutes 49 seconds  Findings:                 The perianal and digital rectal examinations were                            normal.                           Four sessile polyps were found in the rectum and                            transverse colon. The polyps were 2 to 7 mm in                            size. These polyps were removed with a cold snare.                            Resection and retrieval were  complete. Verification                            of patient identification for the specimen was                            done. Estimated blood loss was minimal.                           The exam was otherwise without abnormality on                            direct and retroflexion views. Complications:            No immediate complications. Estimated Blood Loss:     Estimated blood loss was minimal. Impression:               - Four 2 to 7 mm polyps in the rectum and in the                            transverse colon, removed with a cold snare.                            Resected and retrieved.                           - The examination was otherwise normal on direct                            and retroflexion views. Recommendation:           - Patient has a contact number available for                            emergencies.  The signs and symptoms of potential                            delayed complications were discussed with the                            patient. Return to normal activities tomorrow.                            Written discharge instructions were provided to the                            patient.                           - Patient has a contact number available for                            emergencies. The signs and symptoms of potential                            delayed complications were discussed with the  patient. Return to normal activities tomorrow.                            Written discharge instructions were provided to the                            patient.                           - Resume previous diet.                           - Continue present medications.                           - Repeat colonoscopy is recommended. The                            colonoscopy date will be determined after pathology                            results from today's exam become available for                            review. Gatha Mayer, MD 04/10/2017 11:10:26 AM This report has been signed electronically.

## 2017-04-10 NOTE — Progress Notes (Signed)
A/ox3 pleased with MAC, report to [facility]

## 2017-04-11 ENCOUNTER — Telehealth: Payer: Self-pay | Admitting: *Deleted

## 2017-04-11 NOTE — Telephone Encounter (Signed)
No answer left message for follow up and will attempt to call back later this afternoon. Sm

## 2017-04-11 NOTE — Telephone Encounter (Signed)
No answer for follow up call. LEft message for patient to call back with questions or concerns.

## 2017-04-16 ENCOUNTER — Encounter: Payer: Self-pay | Admitting: Internal Medicine

## 2017-04-16 DIAGNOSIS — Z860101 Personal history of adenomatous and serrated colon polyps: Secondary | ICD-10-CM

## 2017-04-16 DIAGNOSIS — Z86010 Personal history of colonic polyps: Secondary | ICD-10-CM

## 2017-04-16 HISTORY — DX: Personal history of adenomatous and serrated colon polyps: Z86.0101

## 2017-04-16 HISTORY — DX: Personal history of colonic polyps: Z86.010

## 2017-04-16 NOTE — Progress Notes (Signed)
3 small polyps only 1 pre-cancerous 1 adenoma Recall 2023

## 2017-05-01 ENCOUNTER — Ambulatory Visit: Payer: Medicaid Other | Admitting: Internal Medicine

## 2017-05-01 ENCOUNTER — Encounter (HOSPITAL_COMMUNITY): Payer: Medicaid Other

## 2017-05-03 ENCOUNTER — Other Ambulatory Visit (HOSPITAL_COMMUNITY): Payer: Self-pay | Admitting: *Deleted

## 2017-05-03 ENCOUNTER — Ambulatory Visit (INDEPENDENT_AMBULATORY_CARE_PROVIDER_SITE_OTHER): Payer: Medicaid Other | Admitting: Internal Medicine

## 2017-05-03 ENCOUNTER — Encounter: Payer: Self-pay | Admitting: Internal Medicine

## 2017-05-03 VITALS — BP 162/74 | HR 66 | Ht 67.0 in | Wt 209.0 lb

## 2017-05-03 DIAGNOSIS — Z23 Encounter for immunization: Secondary | ICD-10-CM | POA: Diagnosis not present

## 2017-05-03 DIAGNOSIS — M318 Other specified necrotizing vasculopathies: Secondary | ICD-10-CM

## 2017-05-03 NOTE — Progress Notes (Signed)
Subjective:     Patient ID: Catherine Aguilar, female   DOB: 1956/09/05, 61 y.o.   MRN: [id number]  HPI OV 02/12/2017  Chief Complaint  Patient presents with  . Follow-up    Pt states her breathing is doing well. Pt denies any cough, SOB, CP/tightness, and f/c/s.    Follow-up severe mid vasculitis syndrome with life-threatening alveolar hemorrhage and acute renal failure is easter weekend 2018  Currently she is doing well. She has finished 4 doses of Rituxan. Last dose was 01/31/2017. There was significant delay between the first 2 doses which were on schedule in the third and fourth doses delayed. Still on prednisone 60 mg per day. She is accompanied by her daughter Catherine Aguilar. Overall she's doing well. This no hemoptysis anymore. This no shortness of breath. Her G6PD was normal in May 2018. Her Quantiferon Gold was normal. Her most recent chemistries were in May 2018 and a creatinine that improved with 3 mg percent approximately.  OV 05/03/2017  Chief Complaint  Patient presents with  . Follow-up    Pt states her breathing is unchanged since last OV. Pt denies cough, CP/tightness, f/c/s.       61 year old female with systemic vasculitis syndrome. Last dose of Rituxan was in May 2018. She is currently tapered her prednisone down to 10 mg per day. She is on Bactrim prophylaxis. She has visited with nephrology Catherine Aguilar in the last 1 month. According to her history blood tests have improved. She does not want to have repeat blood test today. Last blood test was within a month. She is getting iron infusions at short stay. According to the daughter blood pressure was high and one of the infusions was held off. There is no hemoptysis chest pain or hematuria or any clinical evidence of vasculitis. She is due for flu shot.   Results for Catherine Aguilar (MRN [id number]) as of 05/03/2017 11:25  Ref. Range 04/03/2017 13:23  Hemoglobin Latest Ref Range: 12.0 - 15.0 g/dL 11.7 (L)   Results for  Catherine Aguilar (MRN [id number]) as of 05/03/2017 11:25  Ref. Range 01/28/2017 10:44 01/31/2017 09:37 02/11/2017 09:33 02/12/2017 11:11 02/28/2017 09:30 02/28/2017 09:41 04/03/2017 13:16 04/03/2017 13:23  Creatinine Latest Ref Range: 0.40 - 1.20 mg/dL 3.35 (H)   3.26 (H)        has a past medical history of Allergy; Anemia; Chronic kidney disease; High cholesterol; adenomatous polyp of colon (04/16/2017); Hypertension; Stroke [facility]); and Type 2 diabetes mellitus without complication, with long-term current use of insulin ([place]) (02/11/2017).   reports that she quit smoking about 4 months ago. Her smoking use included Cigarettes. She has a 20.00 pack-year smoking history. She has never used smokeless tobacco.  Past Surgical History:  Procedure Laterality Date  . ABDOMINAL HYSTERECTOMY  1985    No Known Allergies  Immunization History  Administered Date(s) Administered  . Influenza Split 07/02/2012  . Pneumococcal Polysaccharide-23 07/02/2012    Family History  Problem Relation Age of Onset  . Cancer Father   . Alcohol abuse Father   . Diabetes Father   . Hypertension Father   . Heart attack Mother   . Diabetes Mother   . Hypertension Mother   . Stroke Mother   . Breast cancer Neg Hx   . Colon cancer Neg Hx   . Esophageal cancer Neg Hx   . Rectal cancer Neg Hx   . Stomach cancer Neg Hx   . Pancreatic cancer Neg Hx      Current Outpatient  Prescriptions:  .  aspirin 81 MG EC tablet, TAKE 1 TABLET BY MOUTH EVERY DAY, Disp: 90 tablet, Rfl: 0 .  blood glucose meter kit and supplies KIT, Dispense based on patient and insurance preference. Use up to four times daily as directed. (FOR ICD-9 250.00, 250.01)., Disp: 1 each, Rfl: 0 .  glucose blood test strip, 1 each by Other route 3 (three) times daily. Use as instructed, Disp: 100 each, Rfl: 12 .  hydrALAZINE (APRESOLINE) 25 MG tablet, Take 3 tablets (75 mg total) by mouth 3 (three) times daily., Disp: 120 tablet, Rfl: 1 .  Insulin Glargine (LANTUS  SOLOSTAR) 100 UNIT/ML Solostar Pen, Inject 5 Units into the skin daily at 10 pm., Disp: 5 pen, Rfl: PRN .  Insulin Pen Needle (PEN NEEDLES) 31G X 5 MM MISC, 1 Units by Does not apply route 3 (three) times daily., Disp: 90 each, Rfl: 0 .  labetalol (NORMODYNE) 200 MG tablet, Take 200 mg by mouth 2 (two) times daily., Disp: , Rfl: 5 .  pravastatin (PRAVACHOL) 40 MG tablet, Take 1 tablet (40 mg total) by mouth daily. For elevated cholesterol, Disp: 30 tablet, Rfl: 1 .  predniSONE (DELTASONE) 10 MG tablet, 59m daily for rest of June 2018, then 235mdaily for July 2018, then 1043mor August then continue, Disp: 120 tablet, Rfl: 1 .  sulfamethoxazole-trimethoprim (BACTRIM DS,SEPTRA DS) 800-160 MG tablet, Take 1 tablet by mouth 3 (three) times a week., Disp: 12 tablet, Rfl: 5  Current Facility-Administered Medications:  .  0.9 %  sodium chloride infusion, 500 mL, Intravenous, Continuous, GesCarlean PurlrOfilia NeasD   Review of Systems     Objective:   Physical Exam  Constitutional: She is oriented to person, place, and time. She appears well-developed and well-nourished. No distress.  cushingoid  HENT:  Head: Normocephalic and atraumatic.  Right Ear: External ear normal.  Left Ear: External ear normal.  Mouth/Throat: Oropharynx is clear and moist. No oropharyngeal exudate.  Eyes: Pupils are equal, round, and reactive to light. Conjunctivae and EOM are normal. Right eye exhibits no discharge. Left eye exhibits no discharge. No scleral icterus.  Neck: Normal range of motion. Neck supple. No JVD present. No tracheal deviation present. No thyromegaly present.  Cardiovascular: Normal rate, regular rhythm, normal heart sounds and intact distal pulses.  Exam reveals no gallop and no friction rub.   No murmur heard. Pulmonary/Chest: Effort normal and breath sounds normal. No respiratory distress. She has no wheezes. She has no rales. She exhibits no tenderness.  Abdominal: Soft. Bowel sounds are normal. She  exhibits no distension and no mass. There is no tenderness. There is no rebound and no guarding.  Musculoskeletal: Normal range of motion. She exhibits no edema or tenderness.  Lymphadenopathy:    She has no cervical adenopathy.  Neurological: She is alert and oriented to person, place, and time. She has normal reflexes. No cranial nerve deficit. She exhibits normal muscle tone. Coordination normal.  Skin: Skin is warm and dry. No rash noted. She is not diaphoretic. No erythema. No pallor.  Psychiatric: She has a normal mood and affect. Her behavior is normal. Judgment and thought content normal.  Vitals reviewed.  Vitals:   05/03/17 1102  BP: (!) 162/74  Pulse: 66  SpO2: 96%  Weight: 209 lb (94.8 kg)  Height: _0  (1.702 m)       Assessment:       ICD-10-CM   1. Systemic vasculitis syndrome ([place]31.8  Plan:     Systemic vasculitis syndrome ([place]) Improved clinically  Plan Continue bactrim 1DS Monday, wed, Friday - for infection prevention Reduce prednisone from 35m daily to 524mdaily Flu shot 05/03/2017 Please talk to PCP [facility], KaPete PeltMD -  and ensure you discuss safety and eligibilty of shingrix vaccine   Followup  - 2-3 months from now; at that time will get B Cell flow cytometry - when the CellCept return then we will re-initiate Rituxan infusions to prevent relapse  Dr. MuBrand MalesM.D., F.[facility].P Pulmonary and Critical Care Medicine Staff Physician Co[facility]ranford[facility] and Critical Care Pager: [phone] no answer or between  15:00h - 7:00h: call [phone]  05/03/2017 11:34 AM

## 2017-05-03 NOTE — Patient Instructions (Addendum)
Systemic vasculitis syndrome ([place]) Improved clinically  Plan Continue bactrim 1DS Monday, wed, Friday - for infection prevention Reduce prednisone from 10mg  daily to 5mg  daily Flu shot 05/03/2017 Please talk to PCP Mayo, Pete Pelt, MD -  and ensure you discuss safety and eligibilty of shingrix vaccine   Followup  - 2-3 months from now; at that time will get B Cell flow cytometry

## 2017-05-06 ENCOUNTER — Other Ambulatory Visit: Payer: Self-pay | Admitting: Internal Medicine

## 2017-05-06 ENCOUNTER — Encounter (HOSPITAL_COMMUNITY)
Admission: RE | Admit: 2017-05-06 | Discharge: 2017-05-06 | Disposition: A | Discharge status: Home / Self Care | Payer: Medicaid Other | Source: Ambulatory Visit | Attending: Internal Medicine | Admitting: Internal Medicine

## 2017-05-06 DIAGNOSIS — N170 Acute kidney failure with tubular necrosis: Secondary | ICD-10-CM

## 2017-05-06 DIAGNOSIS — I776 Arteritis, unspecified: Secondary | ICD-10-CM | POA: Diagnosis not present

## 2017-05-06 LAB — POCT HEMOGLOBIN-HEMACUE: HEMOGLOBIN: 10.7 g/dL — AB (ref 12.0–15.0)

## 2017-05-06 MED ORDER — MICROLET LANCETS MISC: 3 refills | Status: DC

## 2017-05-06 MED ORDER — DARBEPOETIN ALFA 150 MCG/0.3ML IJ SOSY
150.0000 ug | PREFILLED_SYRINGE | INTRAMUSCULAR | Status: DC
Start: 2017-05-06 — End: 2017-05-07
  Administered 2017-05-06: 150 ug via SUBCUTANEOUS

## 2017-05-06 MED ORDER — SODIUM CHLORIDE 0.9 % IV SOLN
510.0000 mg | Freq: Once | INTRAVENOUS | Status: AC
  Administered 2017-05-06: 510 mg via INTRAVENOUS
  Filled 2017-05-06: qty 17

## 2017-05-06 MED ORDER — DARBEPOETIN ALFA 150 MCG/0.3ML IJ SOSY
PREFILLED_SYRINGE | INTRAMUSCULAR | Status: AC
  Administered 2017-05-06: 11:00:00 150 ug via SUBCUTANEOUS
  Filled 2017-05-06: qty 0.3

## 2017-05-07 ENCOUNTER — Other Ambulatory Visit: Payer: Self-pay | Admitting: *Deleted

## 2017-05-08 MED ORDER — MICROLET LANCETS MISC: 3 refills | Status: DC

## 2017-05-10 ENCOUNTER — Other Ambulatory Visit: Payer: Self-pay | Admitting: Internal Medicine

## 2017-05-10 MED ORDER — MICROLET LANCETS MISC: 3 refills | Status: DC

## 2017-05-10 NOTE — Telephone Encounter (Signed)
Patient called stating she needed Bayer Color Micro lancets sent to [facility].

## 2017-05-10 NOTE — Addendum Note (Signed)
Addended by: Derl Barrow on: 05/10/2017 03:39 PM   Modules accepted: Orders

## 2017-05-10 NOTE — Addendum Note (Signed)
Addended by: Derl Barrow on: 05/10/2017 03:41 PM   Modules accepted: Orders

## 2017-05-20 ENCOUNTER — Encounter: Payer: Self-pay | Admitting: Internal Medicine

## 2017-06-03 ENCOUNTER — Encounter (HOSPITAL_COMMUNITY): Payer: Medicaid Other

## 2017-06-11 ENCOUNTER — Encounter (HOSPITAL_COMMUNITY)
Admission: RE | Admit: 2017-06-11 | Discharge: 2017-06-11 | Disposition: A | Discharge status: Home / Self Care | Payer: Medicaid Other | Source: Ambulatory Visit | Attending: Internal Medicine | Admitting: Internal Medicine

## 2017-06-11 DIAGNOSIS — N170 Acute kidney failure with tubular necrosis: Secondary | ICD-10-CM

## 2017-06-11 DIAGNOSIS — I776 Arteritis, unspecified: Secondary | ICD-10-CM | POA: Insufficient documentation

## 2017-06-11 LAB — IRON AND TIBC
IRON: 104 ug/dL (ref 28–170)
Saturation Ratios: 37 % — ABNORMAL HIGH (ref 10.4–31.8)
TIBC: 280 ug/dL (ref 250–450)
UIBC: 176 ug/dL

## 2017-06-11 LAB — FERRITIN: Ferritin: 209 ng/mL (ref 11–307)

## 2017-06-11 LAB — POCT HEMOGLOBIN-HEMACUE: HEMOGLOBIN: 11.5 g/dL — AB (ref 12.0–15.0)

## 2017-06-11 MED ORDER — DARBEPOETIN ALFA 150 MCG/0.3ML IJ SOSY
150.0000 ug | PREFILLED_SYRINGE | INTRAMUSCULAR | Status: DC
  Administered 2017-06-11: 150 ug via SUBCUTANEOUS

## 2017-06-11 MED ORDER — DARBEPOETIN ALFA 150 MCG/0.3ML IJ SOSY
PREFILLED_SYRINGE | INTRAMUSCULAR | Status: AC
  Administered 2017-06-11: 11:00:00 150 ug via SUBCUTANEOUS
  Filled 2017-06-11: qty 0.3

## 2017-07-04 ENCOUNTER — Other Ambulatory Visit: Payer: Self-pay | Admitting: Internal Medicine

## 2017-07-09 ENCOUNTER — Encounter (HOSPITAL_COMMUNITY)
Admission: RE | Admit: 2017-07-09 | Discharge: 2017-07-09 | Disposition: A | Discharge status: Home / Self Care | Payer: Medicaid Other | Source: Ambulatory Visit | Attending: Nephrology | Admitting: Nephrology

## 2017-07-09 DIAGNOSIS — N170 Acute kidney failure with tubular necrosis: Secondary | ICD-10-CM

## 2017-07-09 DIAGNOSIS — I776 Arteritis, unspecified: Secondary | ICD-10-CM | POA: Diagnosis not present

## 2017-07-09 LAB — POCT HEMOGLOBIN-HEMACUE: HEMOGLOBIN: 11.7 g/dL — AB (ref 12.0–15.0)

## 2017-07-09 MED ORDER — DARBEPOETIN ALFA 150 MCG/0.3ML IJ SOSY
PREFILLED_SYRINGE | INTRAMUSCULAR | Status: AC
  Administered 2017-07-09: 150 ug
  Filled 2017-07-09: qty 0.3

## 2017-07-09 MED ORDER — DARBEPOETIN ALFA 150 MCG/0.3ML IJ SOSY: 150.0000 ug | PREFILLED_SYRINGE | INTRAMUSCULAR | Status: DC

## 2017-08-05 ENCOUNTER — Ambulatory Visit: Payer: Medicaid Other | Admitting: Internal Medicine

## 2017-08-05 ENCOUNTER — Other Ambulatory Visit: Payer: Self-pay | Admitting: Internal Medicine

## 2017-08-06 ENCOUNTER — Encounter (HOSPITAL_COMMUNITY): Payer: Self-pay

## 2017-08-06 ENCOUNTER — Inpatient Hospital Stay (HOSPITAL_COMMUNITY)
Admission: RE | Admit: 2017-08-06 | Discharge: 2017-08-06 | Disposition: A | Discharge status: Home / Self Care | Payer: Medicaid Other | Source: Ambulatory Visit | Attending: Nephrology | Admitting: Nephrology

## 2017-08-13 ENCOUNTER — Telehealth: Payer: Self-pay | Admitting: Internal Medicine

## 2017-08-13 NOTE — Telephone Encounter (Signed)
She missed appt. Ensure followup. She has vasculitis  Dr. Brand Males, M.D., [facility].C.P Pulmonary and Critical Care Medicine Staff Physician, [place] Director - Interstitial Lung Disease  Program  Pulmonary [place] at [place], [place], [address]  Pager: [phone], If no answer or between  15:00h - 7:00h: call [phone] Telephone: [phone]

## 2017-08-13 NOTE — Telephone Encounter (Signed)
Spoke with pt, she made an appt with MR on 08/20/2017 at 11:45am. Nothing further is needed. FYi MR

## 2017-08-14 NOTE — Telephone Encounter (Signed)
Will have the paper ready for the b cell flow cytometry when pt comes in for her visit.

## 2017-08-14 NOTE — Telephone Encounter (Signed)
Thanks. Also, Raquel Sarna: when she comes in she will need b cell flow cytometry  Dr. Brand Males, M.D., [facility].C.P Pulmonary and Critical Care Medicine Staff Physician, [place] Director - Interstitial Lung Disease  Program  Pulmonary [place] at [place], [place], [address]  Pager: [phone], If no answer or between  15:00h - 7:00h: call [phone] Telephone: [phone]

## 2017-08-20 ENCOUNTER — Ambulatory Visit: Payer: Medicaid Other | Admitting: Internal Medicine

## 2017-08-20 ENCOUNTER — Encounter (HOSPITAL_COMMUNITY): Payer: Medicaid Other

## 2017-09-02 ENCOUNTER — Emergency Department (HOSPITAL_COMMUNITY): Payer: Medicaid Other

## 2017-09-02 ENCOUNTER — Emergency Department (HOSPITAL_COMMUNITY)
Admission: EM | Admit: 2017-09-02 | Discharge: 2017-09-03 | Disposition: A | Discharge status: Home / Self Care | Payer: Medicaid Other | Attending: Emergency Medicine | Admitting: Emergency Medicine

## 2017-09-02 ENCOUNTER — Other Ambulatory Visit: Payer: Self-pay

## 2017-09-02 DIAGNOSIS — Z87891 Personal history of nicotine dependence: Secondary | ICD-10-CM | POA: Insufficient documentation

## 2017-09-02 DIAGNOSIS — R0602 Shortness of breath: Secondary | ICD-10-CM | POA: Diagnosis present

## 2017-09-02 DIAGNOSIS — Z8673 Personal history of transient ischemic attack (TIA), and cerebral infarction without residual deficits: Secondary | ICD-10-CM | POA: Diagnosis not present

## 2017-09-02 DIAGNOSIS — Z7982 Long term (current) use of aspirin: Secondary | ICD-10-CM | POA: Insufficient documentation

## 2017-09-02 DIAGNOSIS — N189 Chronic kidney disease, unspecified: Secondary | ICD-10-CM | POA: Insufficient documentation

## 2017-09-02 DIAGNOSIS — Z794 Long term (current) use of insulin: Secondary | ICD-10-CM | POA: Insufficient documentation

## 2017-09-02 DIAGNOSIS — I129 Hypertensive chronic kidney disease with stage 1 through stage 4 chronic kidney disease, or unspecified chronic kidney disease: Secondary | ICD-10-CM | POA: Diagnosis not present

## 2017-09-02 DIAGNOSIS — J40 Bronchitis, not specified as acute or chronic: Secondary | ICD-10-CM | POA: Diagnosis not present

## 2017-09-02 DIAGNOSIS — E1122 Type 2 diabetes mellitus with diabetic chronic kidney disease: Secondary | ICD-10-CM | POA: Insufficient documentation

## 2017-09-02 LAB — COMPREHENSIVE METABOLIC PANEL
ALT: 21 U/L (ref 14–54)
AST: 20 U/L (ref 15–41)
Albumin: 4.5 g/dL (ref 3.5–5.0)
Alkaline Phosphatase: 67 U/L (ref 38–126)
Anion gap: 9 (ref 5–15)
BUN: 52 mg/dL — ABNORMAL HIGH (ref 6–20)
CALCIUM: 9.5 mg/dL (ref 8.9–10.3)
CHLORIDE: 102 mmol/L (ref 101–111)
CO2: 20 mmol/L — ABNORMAL LOW (ref 22–32)
CREATININE: 3.16 mg/dL — AB (ref 0.44–1.00)
GFR, EST AFRICAN AMERICAN: 17 mL/min — AB (ref >60)
GFR, EST NON AFRICAN AMERICAN: 15 mL/min — AB (ref >60)
Glucose, Bld: 118 mg/dL — ABNORMAL HIGH (ref 65–99)
Potassium: 3.8 mmol/L (ref 3.5–5.1)
Sodium: 131 mmol/L — ABNORMAL LOW (ref 135–145)
Total Bilirubin: 0.3 mg/dL (ref 0.3–1.2)
Total Protein: 7 g/dL (ref 6.5–8.1)

## 2017-09-02 LAB — BLOOD GAS, ARTERIAL
Acid-base deficit: 3.1 mmol/L — ABNORMAL HIGH (ref 0.0–2.0)
BICARBONATE: 20.8 mmol/L (ref 20.0–28.0)
Drawn by: 422461
O2 Content: 2 L/min
O2 Saturation: 91.8 %
PCO2 ART: 35.1 mmHg (ref 32.0–48.0)
PH ART: 7.391 (ref 7.350–7.450)
PO2 ART: 66.8 mmHg — AB (ref 83.0–108.0)
Patient temperature: 98.6

## 2017-09-02 LAB — PROTIME-INR
INR: 0.88
PROTHROMBIN TIME: 11.9 s (ref 11.4–15.2)

## 2017-09-02 LAB — BRAIN NATRIURETIC PEPTIDE: B Natriuretic Peptide: 27.6 pg/mL (ref 0.0–100.0)

## 2017-09-02 LAB — CBC WITH DIFFERENTIAL/PLATELET
Basophils Absolute: 0 10*3/uL (ref 0.0–0.1)
Basophils Relative: 0 %
EOS PCT: 0 %
Eosinophils Absolute: 0 10*3/uL (ref 0.0–0.7)
HCT: 34.6 % — ABNORMAL LOW (ref 36.0–46.0)
Hemoglobin: 11.1 g/dL — ABNORMAL LOW (ref 12.0–15.0)
LYMPHS ABS: 3.1 10*3/uL (ref 0.7–4.0)
LYMPHS PCT: 28 %
MCH: 26.5 pg (ref 26.0–34.0)
MCHC: 32.1 g/dL (ref 30.0–36.0)
MCV: 82.6 fL (ref 78.0–100.0)
MONO ABS: 0.8 10*3/uL (ref 0.1–1.0)
MONOS PCT: 7 %
Neutro Abs: 7.2 10*3/uL (ref 1.7–7.7)
Neutrophils Relative %: 65 %
PLATELETS: 299 10*3/uL (ref 150–400)
RBC: 4.19 MIL/uL (ref 3.87–5.11)
RDW: 14.7 % (ref 11.5–15.5)
WBC: 11.1 10*3/uL — AB (ref 4.0–10.5)

## 2017-09-02 MED ORDER — PREDNISONE 10 MG PO TABS: ORAL_TABLET | ORAL | 0 refills | Status: AC

## 2017-09-02 MED ORDER — DM-GUAIFENESIN ER 30-600 MG PO TB12
1.0000 | ORAL_TABLET | Freq: Once | ORAL | Status: AC
  Administered 2017-09-03: 1 via ORAL
  Filled 2017-09-02: qty 1

## 2017-09-02 MED ORDER — AMOXICILLIN-POT CLAVULANATE 875-125 MG PO TABS: 1.0000 | ORAL_TABLET | Freq: Two times a day (BID) | ORAL | 0 refills | Status: AC

## 2017-09-02 MED ORDER — DM-GUAIFENESIN ER 30-600 MG PO TB12: 1.0000 | ORAL_TABLET | Freq: Two times a day (BID) | ORAL | 0 refills | Status: AC | PRN

## 2017-09-02 MED ORDER — DOXYCYCLINE HYCLATE 100 MG PO CAPS: 100.0000 mg | ORAL_CAPSULE | Freq: Two times a day (BID) | ORAL | 0 refills | Status: AC

## 2017-09-02 MED ORDER — IPRATROPIUM-ALBUTEROL 0.5-2.5 (3) MG/3ML IN SOLN
3.0000 mL | Freq: Once | RESPIRATORY_TRACT | Status: AC
  Administered 2017-09-02: 3 mL via RESPIRATORY_TRACT
  Filled 2017-09-02: qty 3

## 2017-09-02 MED ORDER — ACETAMINOPHEN 500 MG PO TABS
1000.0000 mg | ORAL_TABLET | Freq: Once | ORAL | Status: DC
  Filled 2017-09-02: qty 2

## 2017-09-02 MED ORDER — RACEPINEPHRINE HCL 2.25 % IN NEBU
0.5000 mL | INHALATION_SOLUTION | Freq: Once | RESPIRATORY_TRACT | Status: AC
  Administered 2017-09-02: 0.5 mL via RESPIRATORY_TRACT
  Filled 2017-09-02: qty 0.5

## 2017-09-02 MED ORDER — BENZONATATE 100 MG PO CAPS
100.0000 mg | ORAL_CAPSULE | Freq: Once | ORAL | Status: AC
  Administered 2017-09-03: 100 mg via ORAL
  Filled 2017-09-02: qty 1

## 2017-09-02 MED ORDER — METHYLPREDNISOLONE SODIUM SUCC 125 MG IJ SOLR
125.0000 mg | Freq: Once | INTRAMUSCULAR | Status: AC
Start: 2017-09-02 — End: 2017-09-02
  Administered 2017-09-02: 125 mg via INTRAVENOUS
  Filled 2017-09-02: qty 2

## 2017-09-02 NOTE — ED Notes (Signed)
Bed: JD05 Expected date:  Expected time:  Means of arrival:  Comments: 69 F SOB

## 2017-09-02 NOTE — ED Provider Notes (Signed)
[facility] DEPT Provider Note   CSN: [id number] Arrival date & time: 09/02/17  2021     History   Chief Complaint Chief Complaint  Patient presents with  . Shortness of Breath    HPI Catherine Aguilar is a 61 y.o. female.  HPI   61 year old female with past medical history of CKD, hypertension, Wegener's vasculitis on chronic steroids, who presents with cough and sputum production.  The patient states that for the last 2-3 days, she has had acute on chronic cough with occasional sputum production.  She went to smoke a cigarette today, which she knows she should not, then developed worsening shortness of breath.  She describes that she feels like she has congestion in her chest that she is unable to get up.  She feels like it is stuck in her throat area.  She denies any difficulty swallowing.  Denies any stridor.  Denies any fevers, but has had some chills.  She is been around some people who have had colds as well.  She strongly denies any blood in her sputum production.  No nosebleeds.  No other medical complaints.  She was well prior to smoking the cigarette today.  No lower extremity swelling or pain.  Past Medical History:  Diagnosis Date  . Allergy   . Anemia   . Chronic kidney disease   . High cholesterol   . Hx of adenomatous polyp of colon 04/16/2017  . Hypertension    stopped taking medications 10 years ago  . Stroke ([place])   . Type 2 diabetes mellitus without complication, with long-term current use of insulin ([place]) 02/11/2017    Patient Active Problem List   Diagnosis Date Noted  . Hx of adenomatous polyp of colon 04/16/2017  . Edema of right lower extremity 03/25/2017  . CKD (chronic kidney disease), stage IV ([place]) 02/11/2017  . Type 2 diabetes mellitus without complication, with long-term current use of insulin ([place]) 02/11/2017  . Essential hypertension 12/08/2016  . HLD (hyperlipidemia) 12/08/2016  . Symptomatic anemia 12/08/2016  .  Wegener's granulomatosis with renal involvement ([place]) 12/08/2016  . Systemic vasculitis syndrome ([place]) 12/08/2016  . Acute renal failure (ARF) ([place]) 12/08/2016  . 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 Substance abuse ([place]) 12/08/2016  . Health care maintenance 12/08/2016  . CVA (cerebral vascular accident) ([place]) 07/03/2012    Past Surgical History:  Procedure Laterality Date  . ABDOMINAL HYSTERECTOMY  1985    OB History    No data available       Home Medications    Prior to Admission medications   Medication Sig Start Date End Date Taking? Authorizing Provider  aspirin 81 MG EC tablet TAKE 1 TABLET BY MOUTH EVERY DAY 07/04/17  Yes Mayo, Pete Pelt, MD  B-D UF III MINI PEN NEEDLES 31G X 5 MM MISC 1 UNITS BY DOES NOT APPLY ROUTE 3 (THREE) TIMES DAILY. 07/04/17  Yes Mayo, Pete Pelt, MD  blood glucose meter kit and supplies KIT Dispense based on patient and insurance preference. Use up to four times daily as directed. (FOR ICD-9 250.00, 250.01). 12/16/16  Yes Mariel Aloe, MD  glucose blood test strip 1 each by Other route 3 (three) times daily. Use as instructed 02/11/17  Yes Mayo, Pete Pelt, MD  hydrALAZINE (APRESOLINE) 100 MG tablet Take 100 mg by mouth 3 (three) times daily.   Yes [provider]  Insulin Glargine (LANTUS SOLOSTAR) 100 UNIT/ML Solostar Pen Inject 5 Units into the skin daily at  10 pm. Patient taking differently: Inject 5 Units into the skin every evening.  02/11/17  Yes Mayo, Pete Pelt, MD  labetalol (NORMODYNE) 300 MG tablet Take 300 mg by mouth 2 (two) times daily.   Yes [provider]  MICROLET LANCETS MISC Use as directed to test three times a day. ICD-10 code: E11.9.  Please use Counselling psychologist. 05/10/17  Yes Mayo, Pete Pelt, MD  pravastatin (PRAVACHOL) 40 MG tablet TAKE 1 TABLET BY MOUTH AT BEDTIME FOR CHOLESTEROL 05/10/17  Yes Mayo, Pete Pelt, MD  sulfamethoxazole-trimethoprim (BACTRIM DS,SEPTRA DS) 800-160 MG tablet TAKE 1 TABLET BY  MOUTH 3 (THREE) TIMES A WEEK. Patient taking differently: Take 1 tablet by mouth every Monday, Wednesday, and Friday.  08/05/17  Yes Brand Males, MD  amoxicillin-clavulanate (AUGMENTIN) 875-125 MG tablet Take 1 tablet by mouth every 12 (twelve) hours for 10 days. 09/02/17 09/12/17  Duffy Bruce, MD  aspirin 81 MG EC tablet TAKE 1 TABLET BY MOUTH EVERY DAY Patient not taking: Reported on 09/02/2017 03/01/17   Smiley Houseman, MD  dextromethorphan-guaiFENesin [facility] DM) 30-600 MG 12hr tablet Take 1 tablet by mouth 2 (two) times daily as needed for up to 10 days for cough. 09/02/17 09/12/17  Duffy Bruce, MD  doxycycline (VIBRAMYCIN) 100 MG capsule Take 1 capsule (100 mg total) by mouth 2 (two) times daily for 10 days. 09/02/17 09/12/17  Duffy Bruce, MD  hydrALAZINE (APRESOLINE) 25 MG tablet Take 3 tablets (75 mg total) by mouth 3 (three) times daily. Patient not taking: Reported on 09/02/2017 03/25/17   Mayo, Pete Pelt, MD  hydrALAZINE (APRESOLINE) 25 MG tablet TAKE 4 TABLETS (100 MG TOTAL) BY MOUTH 3 (THREE) TIMES DAILY. Patient not taking: Reported on 09/02/2017 07/04/17   Mayo, Pete Pelt, MD  predniSONE (DELTASONE) 10 MG tablet Take 4 tablets (40 mg total) by mouth daily for 3 days, THEN 3 tablets (30 mg total) daily for 3 days, THEN 2 tablets (20 mg total) daily for 3 days, THEN 1.5 tablets (15 mg total) daily for 3 days. 09/02/17 09/14/17  Duffy Bruce, MD    Family History Family History  Problem Relation Age of Onset  . Cancer Father   . Alcohol abuse Father   . Diabetes Father   . Hypertension Father   . Heart attack Mother   . Diabetes Mother   . Hypertension Mother   . Stroke Mother   . Breast cancer Neg Hx   . Colon cancer Neg Hx   . Esophageal cancer Neg Hx   . Rectal cancer Neg Hx   . Stomach cancer Neg Hx   . Pancreatic cancer Neg Hx     Social History Social History   Tobacco Use  . Smoking status: Former Smoker    Packs/day: 0.50    Years: 40.00     Pack years: 20.00    Types: Cigarettes    Last attempt to quit: 12/07/2016    Years since quitting: 0.7  . Smokeless tobacco: Never Used  Substance Use Topics  . Alcohol use: Yes    Comment: on the weekends - moderately per pt  . Drug use: Yes    Types: Marijuana    Comment: Friday, Saturday, Sunday     Allergies   Patient has no known allergies.   Review of Systems Review of Systems  Constitutional: Positive for fatigue. Negative for chills and fever.  HENT: Positive for congestion. Negative for rhinorrhea and sore throat.   Eyes: Negative for visual disturbance.  Respiratory: Positive for cough, shortness of breath and wheezing.   Cardiovascular: Negative for chest pain and leg swelling.  Gastrointestinal: Negative for abdominal pain, diarrhea, nausea and vomiting.  Genitourinary: Negative for dysuria, flank pain, vaginal bleeding and vaginal discharge.  Musculoskeletal: Negative for neck pain.  Skin: Negative for rash.  Allergic/Immunologic: Negative for immunocompromised state.  Neurological: Negative for syncope and headaches.  Hematological: Does not bruise/bleed easily.  All other systems reviewed and are negative.    Physical Exam Updated Vital Signs BP (!) 168/72 (BP Location: Right Arm)   Pulse 70   Temp 98 F (36.7 C) (Rectal)   Resp 17   Ht _0  (1.702 m)   Wt 94.8 kg (209 lb)   SpO2 97%   BMI 32.73 kg/m   Physical Exam  Constitutional: She is oriented to person, place, and time. She appears well-developed and well-nourished. No distress.  HENT:  Head: Normocephalic and atraumatic.  No oral or tongue swelling. No pooling of secretions.  Eyes: Conjunctivae are normal.  Neck: Neck supple.  No stridor  Cardiovascular: Normal rate, regular rhythm and normal heart sounds. Exam reveals no friction rub.  No murmur heard. Pulmonary/Chest: Effort normal. No respiratory distress. She has wheezes (faint, expiratory). She has no rales.  Abdominal: She  exhibits no distension.  Musculoskeletal: She exhibits no edema.  Neurological: She is alert and oriented to person, place, and time. She exhibits normal muscle tone.  Skin: Skin is warm. Capillary refill takes less than 2 seconds.  Psychiatric: She has a normal mood and affect.  Nursing note and vitals reviewed.    ED Treatments / Results  Labs (all labs ordered are listed, but only abnormal results are displayed) Labs Reviewed  CBC WITH DIFFERENTIAL/PLATELET - Abnormal; Notable for the following components:      Result Value   WBC 11.1 (*)    Hemoglobin 11.1 (*)    HCT 34.6 (*)    All other components within normal limits  COMPREHENSIVE METABOLIC PANEL - Abnormal; Notable for the following components:   Sodium 131 (*)    CO2 20 (*)    Glucose, Bld 118 (*)    BUN 52 (*)    Creatinine, Ser 3.16 (*)    GFR calc non Af Amer 15 (*)    GFR calc Af Amer 17 (*)    All other components within normal limits  BLOOD GAS, ARTERIAL - Abnormal; Notable for the following components:   pO2, Arterial 66.8 (*)    Acid-base deficit 3.1 (*)    All other components within normal limits  PROTIME-INR  BRAIN NATRIURETIC PEPTIDE  I-STAT TROPONIN, ED    EKG Normal sinus rhythm. Occasional pACs. Non-specific TW changes in precordial leads are new from prior, o/w no ST elevations or depressions. No S1Q3T3.  Radiology Dg Neck Soft Tissue  Result Date: 09/02/2017 CLINICAL DATA:  Cough, short of breath EXAM: NECK SOFT TISSUES - 1+ VIEW COMPARISON:  None. FINDINGS: Prevertebral soft tissue thickness is within normal limits. No radiopaque foreign body. Subglottic trachea is patent. No retropharyngeal gas collection. Suboptimal evaluation of the epiglottis. IMPRESSION: Negative. Electronically Signed   By: Donavan Foil M.D.   On: 09/02/2017 22:05   Dg Chest 2 View  Result Date: 09/02/2017 CLINICAL DATA:  Shortness of breath EXAM: CHEST  2 VIEW COMPARISON:  01/10/2017 FINDINGS: Cardiomegaly. No edema  or focal pulmonary infiltrate. No pneumothorax. IMPRESSION: Cardiomegaly.  Negative for edema or focal pulmonary infiltrate. Electronically Signed   By: Maudie Mercury  Francoise Ceo M.D.   On: 09/02/2017 22:04    Procedures Procedures (including critical care time)  Medications Ordered in ED Medications  ipratropium-albuterol (DUONEB) 0.5-2.5 (3) MG/3ML nebulizer solution 3 mL (3 mLs Nebulization Given 09/02/17 2147)  methylPREDNISolone sodium succinate (SOLU-MEDROL) 125 mg/2 mL injection 125 mg (125 mg Intravenous Given 09/02/17 2200)  Racepinephrine HCl 2.25 % nebulizer solution 0.5 mL (0.5 mLs Nebulization Given 09/02/17 2244)  dextromethorphan-guaiFENesin (MUCINEX DM) 30-600 MG per 12 hr tablet 1 tablet (1 tablet Oral Given 09/03/17 0009)  benzonatate (TESSALON) capsule 100 mg (100 mg Oral Given 09/03/17 0009)  labetalol (NORMODYNE) tablet 300 mg (300 mg Oral Given 09/03/17 0027)  hydrALAZINE (APRESOLINE) tablet 100 mg (100 mg Oral Given 09/03/17 0059)     Initial Impression / Assessment and Plan / ED Course  I have reviewed the triage vital signs and the nursing notes.  Pertinent labs & imaging results that were available during my care of the patient were reviewed by me and considered in my medical decision making (see chart for details).     61 yo F with PMHx systemic vasculitis on chronic prednisone here with cough, ongoing sputum production, and wheezing that worsened after smoking a cigarette. VSS on arrival. She is not hypoxic or tachypneic. Exam does show mild wheezing, but overall good aeration - will give breathing tx, steroids, and obtain screening labs. Denies any hemoptysis, no signs to suggest recurrent or worsening vasculitis. No fever or signs of sepsis/PNA. She has no upper airway stridor, difficulty swallowing/breathing, or sx of upper airway obstruction such as epiglottitis, RPA. Will f/u response to tx. EKG does show some non-specific TWC but Trop neg despite constant sx - doubt  ACS. No LE swelling, hypoxia or signs of PE.  Pt feels improved with tx. She remains afebrile, HDS in ED. CXR clear. Labs are at baseline. D/w Dr. Esau Grew of Pulm - suspect mild bronchitis, unlikely exacerbation of her vasculitis. Will tx with steroid taper, ABX given sputum production, and mucolytic. Pt updated and in agreement.   Final Clinical Impressions(s) / ED Diagnoses   Final diagnoses:  Bronchitis    ED Discharge Orders        Ordered    doxycycline (VIBRAMYCIN) 100 MG capsule  2 times daily     09/02/17 2353    amoxicillin-clavulanate (AUGMENTIN) 875-125 MG tablet  Every 12 hours     09/02/17 2353    predniSONE (DELTASONE) 10 MG tablet     09/02/17 2353    dextromethorphan-guaiFENesin (MUCINEX DM) 30-600 MG 12hr tablet  2 times daily PRN     09/02/17 2355       Duffy Bruce, MD 09/03/17 0302

## 2017-09-02 NOTE — ED Triage Notes (Signed)
Pt arrived via [facility] with c/o of shortness of breath that got worse at 1500 this afternoon. Pt was diagnosed with bronchitis in March and has since had trouble breathing. Pt has albuterol inhaler at home which she took 2x with no relief, last time was at 1830. Pt has history of hypertension and Wegeners Disease. Pt is taking prednisone.Pt c/o of headache as well.  197/115 93 22 97% on neb 199 cbg

## 2017-09-02 NOTE — Discharge Instructions (Signed)
Use your albuterol inhaler every 4 hours for the next 2 days, then as needed  Call your doctor to set up an appointment this week  Do not smoke

## 2017-09-02 NOTE — ED Notes (Signed)
Patient transported to X-ray 

## 2017-09-03 LAB — I-STAT TROPONIN, ED: Troponin i, poc: 0 ng/mL (ref 0.00–0.08)

## 2017-09-03 MED ORDER — LABETALOL HCL 300 MG PO TABS
300.0000 mg | ORAL_TABLET | Freq: Once | ORAL | Status: AC
  Administered 2017-09-03: 300 mg via ORAL
  Filled 2017-09-03: qty 1

## 2017-09-03 MED ORDER — HYDRALAZINE HCL 50 MG PO TABS
100.0000 mg | ORAL_TABLET | Freq: Once | ORAL | Status: AC
  Administered 2017-09-03: 100 mg via ORAL
  Filled 2017-09-03: qty 2

## 2017-09-04 ENCOUNTER — Other Ambulatory Visit: Payer: Self-pay | Admitting: Internal Medicine

## 2017-09-12 ENCOUNTER — Ambulatory Visit (HOSPITAL_COMMUNITY)
Admission: RE | Admit: 2017-09-12 | Discharge: 2017-09-12 | Disposition: A | Discharge status: Home / Self Care | Payer: Medicaid Other | Source: Ambulatory Visit | Attending: Nephrology | Admitting: Nephrology

## 2017-09-12 VITALS — BP 151/69 | HR 58 | Temp 98.0°F | Resp 18

## 2017-09-12 DIAGNOSIS — N170 Acute kidney failure with tubular necrosis: Secondary | ICD-10-CM

## 2017-09-12 DIAGNOSIS — D631 Anemia in chronic kidney disease: Secondary | ICD-10-CM | POA: Diagnosis present

## 2017-09-12 DIAGNOSIS — N184 Chronic kidney disease, stage 4 (severe): Secondary | ICD-10-CM | POA: Insufficient documentation

## 2017-09-12 LAB — IRON AND TIBC
Iron: 86 ug/dL (ref 28–170)
SATURATION RATIOS: 33 % — AB (ref 10.4–31.8)
TIBC: 259 ug/dL (ref 250–450)
UIBC: 173 ug/dL

## 2017-09-12 LAB — POCT HEMOGLOBIN-HEMACUE: Hemoglobin: 11 g/dL — ABNORMAL LOW (ref 12.0–15.0)

## 2017-09-12 LAB — FERRITIN: FERRITIN: 221 ng/mL (ref 11–307)

## 2017-09-12 MED ORDER — DARBEPOETIN ALFA 150 MCG/0.3ML IJ SOSY
150.0000 ug | PREFILLED_SYRINGE | INTRAMUSCULAR | Status: DC
  Administered 2017-09-12: 11:00:00 150 ug via SUBCUTANEOUS

## 2017-09-12 MED ORDER — DARBEPOETIN ALFA 150 MCG/0.3ML IJ SOSY
PREFILLED_SYRINGE | INTRAMUSCULAR | Status: AC
  Filled 2017-09-12: qty 0.3

## 2017-09-16 ENCOUNTER — Other Ambulatory Visit: Payer: Self-pay | Admitting: Internal Medicine

## 2017-09-19 ENCOUNTER — Other Ambulatory Visit (HOSPITAL_COMMUNITY)
Admission: RE | Admit: 2017-09-19 | Discharge: 2017-09-19 | Disposition: A | Discharge status: Home / Self Care | Payer: Medicaid Other | Source: Ambulatory Visit | Attending: Internal Medicine | Admitting: Internal Medicine

## 2017-09-19 ENCOUNTER — Other Ambulatory Visit (INDEPENDENT_AMBULATORY_CARE_PROVIDER_SITE_OTHER): Payer: Medicaid Other

## 2017-09-19 ENCOUNTER — Ambulatory Visit: Payer: Medicaid Other | Admitting: Internal Medicine

## 2017-09-19 ENCOUNTER — Encounter: Payer: Self-pay | Admitting: Internal Medicine

## 2017-09-19 VITALS — BP 130/78 | HR 64 | Ht 67.0 in | Wt 208.0 lb

## 2017-09-19 DIAGNOSIS — R5383 Other fatigue: Secondary | ICD-10-CM

## 2017-09-19 DIAGNOSIS — M318 Other specified necrotizing vasculopathies: Secondary | ICD-10-CM | POA: Diagnosis not present

## 2017-09-19 LAB — CBC WITH DIFFERENTIAL/PLATELET
Basophils Absolute: 0 10*3/uL (ref 0.0–0.1)
Basophils Relative: 0 %
EOS PCT: 1 %
Eosinophils Absolute: 0.1 10*3/uL (ref 0.0–0.7)
HEMATOCRIT: 34 % — AB (ref 36.0–46.0)
Hemoglobin: 10.7 g/dL — ABNORMAL LOW (ref 12.0–15.0)
LYMPHS ABS: 2.1 10*3/uL (ref 0.7–4.0)
Lymphocytes Relative: 18 %
MCH: 26.7 pg (ref 26.0–34.0)
MCHC: 31.5 g/dL (ref 30.0–36.0)
MCV: 84.8 fL (ref 78.0–100.0)
MONO ABS: 0.5 10*3/uL (ref 0.1–1.0)
MONOS PCT: 5 %
NEUTROS ABS: 8.6 10*3/uL — AB (ref 1.7–7.7)
Neutrophils Relative %: 76 %
PLATELETS: 310 10*3/uL (ref 150–400)
RBC: 4.01 MIL/uL (ref 3.87–5.11)
RDW: 16.9 % — AB (ref 11.5–15.5)
WBC: 11.3 10*3/uL — ABNORMAL HIGH (ref 4.0–10.5)

## 2017-09-19 LAB — TSH: TSH: 1.8 u[IU]/mL (ref 0.35–4.50)

## 2017-09-19 LAB — T4, FREE: Free T4: 0.88 ng/dL (ref 0.60–1.60)

## 2017-09-19 NOTE — Patient Instructions (Addendum)
ICD-10-CM   1. Systemic vasculitis syndrome ([place]) M31.8   2. Fatigue, unspecified type R53.83 T4, free    TSH     I thnk the shots you are getting at shot stay is ARANESP and not rituxan  - nurse will double check on this To see if Rituxan has left your system and to decide on futher rituxan to maintain control of your vasculitis  - do B cell flow cytometry blood test Also TSH, FT4 Continue prednisone 10mg  per day and bactrim three times a week as before   Followup  - await our call about blood trest results  - 3 months or sooner if needed

## 2017-09-19 NOTE — Progress Notes (Signed)
Subjective:     Patient ID: Catherine Aguilar, female   DOB: 06/30/1956, 62 y.o.   MRN: [id number]  HPI OV 02/12/2017  Chief Complaint  Patient presents with  . Follow-up    Pt states her breathing is doing well. Pt denies any cough, SOB, CP/tightness, and f/c/s.    Follow-up severe mid vasculitis syndrome with life-threatening alveolar hemorrhage and acute renal failure is easter weekend 2018  Currently she is doing well. She has finished 4 doses of Rituxan. Last dose was 01/31/2017. There was significant delay between the first 2 doses which were on schedule in the third and fourth doses delayed. Still on prednisone 60 mg per day. She is accompanied by her daughter Catherine Aguilar. Overall she's doing well. This no hemoptysis anymore. This no shortness of breath. Her G6PD was normal in May 2018. Her Quantiferon Gold was normal. Her most recent chemistries were in May 2018 and a creatinine that improved with 3 mg percent approximately.  OV 05/03/2017  Chief Complaint  Patient presents with  . Follow-up    Pt states her breathing is unchanged since last OV. Pt denies cough, CP/tightness, f/c/s.       62 year old female with systemic vasculitis syndrome. Last dose of Rituxan was in May 2018. She is currently tapered her prednisone down to 10 mg per day. She is on Bactrim prophylaxis. She has visited with nephrology Catherine Aguilar in the last 1 month. According to her history blood tests have improved. She does not want to have repeat blood test today. Last blood test was within a month. She is getting iron infusions at short stay. According to the daughter blood pressure was high and one of the infusions was held off. There is no hemoptysis chest pain or hematuria or any clinical evidence of vasculitis. She is due for flu shot.   OV 09/19/2017  Chief Complaint  Patient presents with  . Follow-up    Pt was seen at the hospital 09/02/17 due to breathing problems and after doing breathing treatments,  was sent home. Other than that, pt states that she has been doing good.     62 year old female with systemic vasculitis syndrome.  Last dose of Rituxan May 2018.  She had life-threatening hemoptysis and respiratory failure and renal failure back in early 2018.  Last seen in August 2018.  After that she did not follow-up.  She is here with her daughter.  It appears that Christmas Eve 2018 she decided to smoke cigarettes because of the holidays when she got acute respiratory exacerbation with wheezing and went to the emergency department.  I personally reviewed the chart and the chest x-ray that was clear.  She was discharged with a steroid burst.  Now she is back to baseline prednisone 10 mg/day and her baseline Bactrim for her Wegener's vasculitis.  She tells me that she is getting Rituxan shots once a month at the short stay hospital at [facility] but when I reviewed the chart this is actually [facility].  She is due for a B-cell flow cytometry test today because it has been 9-10 months.  Overall she is feeling fine except fatigue.  She tells me that she does not have any eye issues or neuritis of mononeuritis multiplex or renal issues although most recent creatinine continues to be on 3 mg percent.  There is no hemoptysis or shortness of breath currently.  Results for IPEK, WESTRA (MRN [id number]) as of 09/19/2017 11:36  Ref. Range 09/02/2017 21:49 09/02/2017 21:57 09/03/2017  00:25 09/12/2017 10:21 09/12/2017 10:30  Creatinine Latest Ref Range: 0.44 - 1.00 mg/dL 3.16 (H)          has a past medical history of Allergy, Anemia, Chronic kidney disease, High cholesterol, adenomatous polyp of colon (04/16/2017), Hypertension, Stroke [facility]), and Type 2 diabetes mellitus without complication, with long-term current use of insulin ([place]) (02/11/2017).   reports that she quit smoking about 9 months ago. Her smoking use included cigarettes. She has a 20.00 pack-year smoking history. she has never used smokeless  tobacco.  Past Surgical History:  Procedure Laterality Date  . ABDOMINAL HYSTERECTOMY  1985    No Known Allergies  Immunization History  Administered Date(s) Administered  . Influenza Split 07/02/2012  . Influenza,inj,Quad PF,6+ Mos 05/03/2017  . Pneumococcal Polysaccharide-23 07/02/2012    Family History  Problem Relation Age of Onset  . Cancer Father   . Alcohol abuse Father   . Diabetes Father   . Hypertension Father   . Heart attack Mother   . Diabetes Mother   . Hypertension Mother   . Stroke Mother   . Breast cancer Neg Hx   . Colon cancer Neg Hx   . Esophageal cancer Neg Hx   . Rectal cancer Neg Hx   . Stomach cancer Neg Hx   . Pancreatic cancer Neg Hx      Current Outpatient Medications:  .  albuterol (PROVENTIL HFA;VENTOLIN HFA) 108 (90 Base) MCG/ACT inhaler, INHALE 2 PUFFS EVERY 4-6 HOURS, Disp: 6.7 Inhaler, Rfl: 1 .  aspirin 81 MG EC tablet, TAKE 1 TABLET BY MOUTH EVERY DAY, Disp: 90 tablet, Rfl: 0 .  B-D UF III MINI PEN NEEDLES 31G X 5 MM MISC, 1 UNITS BY DOES NOT APPLY ROUTE 3 (THREE) TIMES DAILY., Disp: 100 each, Rfl: 0 .  blood glucose meter kit and supplies KIT, Dispense based on patient and insurance preference. Use up to four times daily as directed. (FOR ICD-9 250.00, 250.01)., Disp: 1 each, Rfl: 0 .  glucose blood test strip, 1 each by Other route 3 (three) times daily. Use as instructed, Disp: 100 each, Rfl: 12 .  hydrALAZINE (APRESOLINE) 100 MG tablet, Take 100 mg by mouth 3 (three) times daily., Disp: , Rfl:  .  Insulin Glargine (LANTUS SOLOSTAR) 100 UNIT/ML Solostar Pen, Inject 5 Units into the skin daily at 10 pm. (Patient taking differently: Inject 5 Units into the skin every evening. ), Disp: 5 pen, Rfl: PRN .  labetalol (NORMODYNE) 300 MG tablet, Take 300 mg by mouth 2 (two) times daily., Disp: , Rfl:  .  MICROLET LANCETS MISC, Use as directed to test three times a day. ICD-10 code: E11.9.  Please use Counselling psychologist., Disp: 100  each, Rfl: 3 .  NICOTINE STEP 2 14 MG/24HR patch, 1 PATCH EVERY 24 HRS TO STOP SMOKING, Disp: 28 patch, Rfl: 1 .  pravastatin (PRAVACHOL) 40 MG tablet, TAKE 1 TABLET BY MOUTH AT BEDTIME FOR CHOLESTEROL, Disp: 90 tablet, Rfl: 3 .  sulfamethoxazole-trimethoprim (BACTRIM DS,SEPTRA DS) 800-160 MG tablet, TAKE 1 TABLET BY MOUTH 3 (THREE) TIMES A WEEK. (Patient taking differently: Take 1 tablet by mouth every Monday, Wednesday, and Friday. ), Disp: 12 tablet, Rfl: 5  Current Facility-Administered Medications:  .  0.9 %  sodium chloride infusion, 500 mL, Intravenous, Continuous, Carlean Purl Ofilia Neas, MD    Review of Systems     Objective:   Physical Exam  Constitutional: She is oriented to person, place, and time. She appears well-developed and  well-nourished. No distress.  HENT:  Head: Normocephalic and atraumatic.  Right Ear: External ear normal.  Left Ear: External ear normal.  Mouth/Throat: Oropharynx is clear and moist. No oropharyngeal exudate.  ? proptosis  Eyes: Conjunctivae and EOM are normal. Pupils are equal, round, and reactive to light. Right eye exhibits no discharge. Left eye exhibits no discharge. No scleral icterus.  Neck: Normal range of motion. Neck supple. No JVD present. No tracheal deviation present. No thyromegaly present.  Cardiovascular: Normal rate, regular rhythm, normal heart sounds and intact distal pulses. Exam reveals no gallop and no friction rub.  No murmur heard. Pulmonary/Chest: Effort normal and breath sounds normal. No respiratory distress. She has no wheezes. She has no rales. She exhibits no tenderness.  Abdominal: Soft. Bowel sounds are normal. She exhibits no distension and no mass. There is no tenderness. There is no rebound and no guarding.  Musculoskeletal: Normal range of motion. She exhibits no edema or tenderness.  Lymphadenopathy:    She has no cervical adenopathy.  Neurological: She is alert and oriented to person, place, and time. She has normal  reflexes. No cranial nerve deficit. She exhibits normal muscle tone. Coordination normal.  Skin: Skin is warm and dry. No rash noted. She is not diaphoretic. No erythema. No pallor.  Dry skin  Psychiatric: She has a normal mood and affect. Her behavior is normal. Judgment and thought content normal.  Vitals reviewed.  Vitals:   09/19/17 1025  BP: 130/78  Pulse: 64  SpO2: 97%  Weight: 208 lb (94.3 kg)  Height: '5\' 7"'  (1.702 m)    Estimated body mass index is 32.58 kg/m as calculated from the following:   Height as of this encounter: '5\' 7"'  (1.702 m).   Weight as of this encounter: 208 lb (94.3 kg).     Assessment:       ICD-10-CM   1. Systemic vasculitis syndrome ([place]) M31.8   2. Fatigue, unspecified type R53.83 T4, free    TSH       Plan:       I thnk the shots you are getting at shot stay is ARANESP and not rituxan  - nurse will double check on this To see if Rituxan has left your system and to decide on futher rituxan to maintain control of your vasculitis  - do B cell flow cytometry blood test Check TSH/Ft2 Continue prednisone 50m per day and bactrim three times a week as before   Followup  - await our call about blood trest results  - 3 months or sooner if needed  > 50% of this > 25 min visit spent in face to face counseling or coordination of care    Dr. MBrand Males M.D., FCenter For Specialized SurgeryC.P Pulmonary and Critical Care Medicine Staff Physician, CAnokaDirector - Interstitial Lung Disease  Program  [place] [address] Pager: [phone] If no answer or between  15:00h - 7:00h: call [phone] Telephone: [phone]

## 2017-09-20 ENCOUNTER — Telehealth: Payer: Self-pay | Admitting: Internal Medicine

## 2017-09-20 NOTE — Telephone Encounter (Signed)
Let Catherine Aguilar know that the blood test shows that the rituxan she got last year is still in her system (b cells absent). This is good news from a vasculitis stand point.  Plan - continue bactrim 3x/week as before - reduce prednisone to 5mg  per day - rov in 3- 4 months; repeat b cell flow cytometry at that visit   Dr. Brand Males, M.D., [facility].C.P Pulmonary and Critical Care Medicine Staff Physician, [place] Director - Interstitial Lung Disease  Program  Pulmonary [place], [place], [address]  Pager: [phone], If no answer or between  15:00h - 7:00h: call [phone] Telephone: [phone]

## 2017-09-26 NOTE — Telephone Encounter (Signed)
Called and spoke with pt's daughter, Catherine Aguilar letting her know the results of pt's labwork.   Zarana expressed understanding of the stated results and for pt to follow-up in 3-4 months and we would repeat the b cell flow cytometry labwork at that visit to make sure everything is still doing good.  Nothing further needed at this time.

## 2017-10-05 ENCOUNTER — Other Ambulatory Visit: Payer: Self-pay | Admitting: Internal Medicine

## 2017-10-10 ENCOUNTER — Encounter (HOSPITAL_COMMUNITY)
Admission: RE | Admit: 2017-10-10 | Discharge: 2017-10-10 | Disposition: A | Discharge status: Home / Self Care | Payer: Medicaid Other | Source: Ambulatory Visit | Attending: Nephrology | Admitting: Nephrology

## 2017-10-10 VITALS — BP 140/72 | HR 68 | Temp 98.3°F | Resp 18

## 2017-10-10 DIAGNOSIS — N170 Acute kidney failure with tubular necrosis: Secondary | ICD-10-CM | POA: Insufficient documentation

## 2017-10-10 LAB — POCT HEMOGLOBIN-HEMACUE: Hemoglobin: 11.2 g/dL — ABNORMAL LOW (ref 12.0–15.0)

## 2017-10-10 MED ORDER — DARBEPOETIN ALFA 150 MCG/0.3ML IJ SOSY
PREFILLED_SYRINGE | INTRAMUSCULAR | Status: AC
  Filled 2017-10-10: qty 0.3

## 2017-10-10 MED ORDER — DARBEPOETIN ALFA 150 MCG/0.3ML IJ SOSY
150.0000 ug | PREFILLED_SYRINGE | INTRAMUSCULAR | Status: DC
  Administered 2017-10-10: 11:00:00 150 ug via SUBCUTANEOUS

## 2017-10-16 ENCOUNTER — Emergency Department (HOSPITAL_COMMUNITY): Payer: Medicaid Other

## 2017-10-16 ENCOUNTER — Encounter (HOSPITAL_COMMUNITY): Payer: Self-pay | Admitting: Emergency Medicine

## 2017-10-16 DIAGNOSIS — Z5321 Procedure and treatment not carried out due to patient leaving prior to being seen by health care provider: Secondary | ICD-10-CM | POA: Diagnosis not present

## 2017-10-16 DIAGNOSIS — R509 Fever, unspecified: Secondary | ICD-10-CM | POA: Diagnosis present

## 2017-10-16 LAB — CBG MONITORING, ED: Glucose-Capillary: 126 mg/dL — ABNORMAL HIGH (ref 65–99)

## 2017-10-16 MED ORDER — ACETAMINOPHEN 325 MG PO TABS
650.0000 mg | ORAL_TABLET | Freq: Once | ORAL | Status: AC | PRN
  Administered 2017-10-16: 650 mg via ORAL
  Filled 2017-10-16: qty 2

## 2017-10-16 NOTE — ED Triage Notes (Signed)
Pt comes from home via EMS with complaints of URI symptoms "for a while now" which has now resulted in her being short of breath with exertion. Pt has fever on arrival. Vitals stable in route. CBG 122.

## 2017-10-16 NOTE — ED Notes (Signed)
Patient requesting IV removal. IV removed from left forearm.

## 2017-10-17 ENCOUNTER — Emergency Department (HOSPITAL_COMMUNITY)
Admission: EM | Admit: 2017-10-17 | Discharge: 2017-10-17 | Discharge status: Against Medical Advice | Payer: Medicaid Other | Attending: Emergency Medicine | Admitting: Emergency Medicine

## 2017-10-17 NOTE — ED Notes (Signed)
Patient called and no answer. 

## 2017-10-17 NOTE — ED Notes (Signed)
Called patient to go to room and no answer.

## 2017-10-17 NOTE — ED Notes (Signed)
Patient called to a room and no answer.

## 2017-10-17 NOTE — ED Notes (Signed)
Pt called to have vitals reassessed with no response.  RN notified.

## 2017-10-25 ENCOUNTER — Other Ambulatory Visit: Payer: Self-pay | Admitting: Internal Medicine

## 2017-10-25 ENCOUNTER — Telehealth: Payer: Self-pay | Admitting: Internal Medicine

## 2017-10-25 MED ORDER — GLUCOSE BLOOD VI STRP: ORAL_STRIP | 12 refills | Status: DC

## 2017-10-25 MED ORDER — ACCU-CHEK AVIVA PLUS W/DEVICE KIT: PACK | 0 refills | Status: DC

## 2017-10-25 NOTE — Telephone Encounter (Signed)
Pt says her machine to check her blood sugar has broken and she needs a new one now. She needs the machine and the stiprs ot get with it. She said her first one she got for free and she wants this one to be free too.

## 2017-10-25 NOTE — Telephone Encounter (Signed)
New meter and strips sent in. Thanks!

## 2017-11-07 ENCOUNTER — Encounter (HOSPITAL_COMMUNITY)
Admission: RE | Admit: 2017-11-07 | Discharge: 2017-11-07 | Disposition: A | Discharge status: Home / Self Care | Payer: Medicaid Other | Source: Ambulatory Visit | Attending: Nephrology | Admitting: Nephrology

## 2017-11-07 VITALS — BP 161/73 | HR 73 | Resp 20

## 2017-11-07 DIAGNOSIS — N170 Acute kidney failure with tubular necrosis: Secondary | ICD-10-CM | POA: Diagnosis present

## 2017-11-07 LAB — IRON AND TIBC
IRON: 65 ug/dL (ref 28–170)
SATURATION RATIOS: 21 % (ref 10.4–31.8)
TIBC: 309 ug/dL (ref 250–450)
UIBC: 244 ug/dL

## 2017-11-07 LAB — FERRITIN: Ferritin: 207 ng/mL (ref 11–307)

## 2017-11-07 LAB — POCT HEMOGLOBIN-HEMACUE: HEMOGLOBIN: 10.9 g/dL — AB (ref 12.0–15.0)

## 2017-11-07 MED ORDER — DARBEPOETIN ALFA 150 MCG/0.3ML IJ SOSY
PREFILLED_SYRINGE | INTRAMUSCULAR | Status: AC
  Administered 2017-11-07: 11:00:00 150 ug via SUBCUTANEOUS
  Filled 2017-11-07: qty 0.3

## 2017-11-07 MED ORDER — DARBEPOETIN ALFA 150 MCG/0.3ML IJ SOSY
150.0000 ug | PREFILLED_SYRINGE | INTRAMUSCULAR | Status: DC
  Administered 2017-11-07: 150 ug via SUBCUTANEOUS

## 2017-11-15 ENCOUNTER — Ambulatory Visit: Payer: Medicaid Other | Admitting: Internal Medicine

## 2017-11-22 ENCOUNTER — Encounter: Payer: Self-pay | Admitting: Internal Medicine

## 2017-11-22 ENCOUNTER — Other Ambulatory Visit: Payer: Self-pay

## 2017-11-22 ENCOUNTER — Ambulatory Visit: Payer: Medicaid Other | Admitting: Internal Medicine

## 2017-11-22 VITALS — BP 138/74 | HR 67 | Temp 98.8°F | Wt 206.0 lb

## 2017-11-22 DIAGNOSIS — Z794 Long term (current) use of insulin: Secondary | ICD-10-CM

## 2017-11-22 DIAGNOSIS — R0602 Shortness of breath: Secondary | ICD-10-CM

## 2017-11-22 DIAGNOSIS — E119 Type 2 diabetes mellitus without complications: Secondary | ICD-10-CM | POA: Diagnosis not present

## 2017-11-22 LAB — POCT GLYCOSYLATED HEMOGLOBIN (HGB A1C): Hemoglobin A1C: 5.8

## 2017-11-22 MED ORDER — PNEUMOCOCCAL VAC POLYVALENT 25 MCG/0.5ML IJ INJ
0.5000 mL | INJECTION | INTRAMUSCULAR | Status: AC
  Administered 2017-11-22: 0.5 mL via INTRAMUSCULAR

## 2017-11-22 MED ORDER — TRIAMCINOLONE ACETONIDE 0.025 % EX OINT: 1.0000 "application " | TOPICAL_OINTMENT | Freq: Two times a day (BID) | CUTANEOUS | 1 refills | Status: DC

## 2017-11-22 MED ORDER — ALBUTEROL SULFATE (2.5 MG/3ML) 0.083% IN NEBU: 2.5000 mg | INHALATION_SOLUTION | Freq: Four times a day (QID) | RESPIRATORY_TRACT | 1 refills | Status: DC | PRN

## 2017-11-22 NOTE — Patient Instructions (Addendum)
It was so nice to see you!  For your shortness of breath- I have ordered a nebulizer machine for you. [facility] will call you to get this set up. You need to pick up the nebulizer solution from your pharmacy.  Please schedule an appointment on your way out to have pulmonary function testing done with Dr. Valentina Lucks. We need to make sure you do not have COPD.  For your rash- I would recommend using Aquaphor twice a day. I have also prescribed a steroid ointment called Triamcinolone to help with the itching.  We will keep your Lantus the same for now. Please let me know if you have any lows.  We will see you back in 3 months!  -Dr. Brett Albino

## 2017-11-22 NOTE — Progress Notes (Signed)
   [facility] Phone: [phone]  Subjective:  Catherine Aguilar is a 62 year old female presenting to clinic with cough and follow-up of her diabetes.  Cough: Has been going on for the last year. The cough is dry. Occurs all throughout the day. Not worse at night. Also endorses associated shortness of breath, wheezing, and dyspnea on exertion. Also with two-pillow orthopnea over the last year. Using Albuterol inhaler 2-3 times per day, which doesn't help the cough. She has used an Albuterol nebulizer in the past, which helped the cough a lot better. No lower extremity edema. No fevers. Has smoked 1 pack per week for 48 years.  T2DM: Checking CBGs 1-2 times per day. Have been ranging from 120-140. No lows. No shakiness, no sweatiness, no polyuria, no polydipsia. Taking Lantus 5 units daily.  ROS: See HPI for pertinent positives and negatives  Past Medical History- HTN, hx CVA, Wegener's granulomatosis with CKD IV, T2DM, HLD, anemia  Family history reviewed for today's visit. No changes.  Social history- patient is a current smoker, smokes 1 pack per week for 48 years.  Objective: BP 138/74   Pulse 67   Temp 98.8 F (37.1 C) (Oral)   Wt 206 lb (93.4 kg)   SpO2 95%   BMI 32.26 kg/m  Gen: NAD, alert, cooperative with exam HEENT: NCAT, EOMI, MMM Neck: FROM, supple CV: RRR, no murmur Resp: Normal work of breathing, mild expiratory wheezing throughout all lung fields, no crackles, mildly decreased air movement throughout. Msk: No edema Neuro: Alert and oriented, no gross deficits Psych: Appropriate behavior  Assessment/Plan: Cough: Patient with shortness of breath, wheezing, dyspnea on exertion, and dry cough. Improved with Albuterol. Concern for undiagnosed COPD given that she has smoked 1 ppd x 48 years. Could also be related to her Wegener's. Undiagnosed CHF less likely without any signs of fluid overload but remains a possibility. - Will order Albuterol nebulizer,  as patient feels this helps her cough and SOB much more than the inhaler.  - Advised patient to schedule an appointment with Dr. Valentina Lucks for PFTs - Follow-up in 1 month  T2DM: Well-controlled. A1c 5.8% today. - Continue Lantus 5 units daily - Can consider stopping Lantus in the future   Hyman Bible, MD PGY-3

## 2017-11-25 ENCOUNTER — Encounter: Payer: Self-pay | Admitting: Pharmacist

## 2017-11-25 ENCOUNTER — Ambulatory Visit (INDEPENDENT_AMBULATORY_CARE_PROVIDER_SITE_OTHER): Payer: Medicaid Other | Admitting: Pharmacist

## 2017-11-25 DIAGNOSIS — J449 Chronic obstructive pulmonary disease, unspecified: Secondary | ICD-10-CM | POA: Insufficient documentation

## 2017-11-25 DIAGNOSIS — J4489 Other specified chronic obstructive pulmonary disease: Secondary | ICD-10-CM

## 2017-11-25 HISTORY — DX: Chronic obstructive pulmonary disease, unspecified: J44.9

## 2017-11-25 HISTORY — DX: Other specified chronic obstructive pulmonary disease: J44.89

## 2017-11-25 MED ORDER — FLUTICASONE-UMECLIDIN-VILANT 100-62.5-25 MCG/INH IN AEPB: 1.0000 | INHALATION_SPRAY | Freq: Every day | RESPIRATORY_TRACT | 0 refills | Status: DC

## 2017-11-25 NOTE — Patient Instructions (Signed)
Thank you for coming in today. Your breathing test showed you have moderate COPD (breathing disease).  Dr. Valentina Lucks has prescribed a month supply of a medicine called Trelegy Ellipta.  This medication should be taken once a day and remember to rinse your mouth and spit out the water every time. Follow up and see. Dr. Brett Albino in a month.

## 2017-11-25 NOTE — Assessment & Plan Note (Signed)
Spirometry evaluation reveals mild/moderate obstructive lung disease corresponding to GOLD Classification B based on spirometry Stage 2, one (1) exacerbation in the past year not leading to a hospitalization, and CAT score of >10. Additionally, spirometry revealed reversibility upon bronchodilator treatment (FVC increased 12%) indicating asthma/COPD overlap syndrome. Patient has been experiencing SOB/trouble breathing for COPD/asthma overlap and has been taking an albuterol MDI.  Initiate Trelegy Ellipta (fluticasone furoate, umeclidinium, and vilanterol) 100/62.5/25 mcg once daily. Trelegy contains an ICS/LABA combo to treat moderate asthma and a LAMA for COPD.  Unsure patient has been able to administer their albuterol MDI appropriately.Educated patient on purpose, proper use, potential adverse effects including increased risk of esophageal candidiasis and need to rinse mouth after each use.  Reviewed results of pulmonary function tests. Counseled on proper technique. Would like to decrease the albuterol use to less than daily.    Mild Nicotine Dependence of 49 years duration in a patient who is fair candidate for success b/c of support from daughter. Pt is a current smoker with a 9+ pack per year smoking history. Only smokes a small portion of each cigarette,  Triggers include worry, after meals and with alcohol.  Written information provided.  Written pt instructions provided.

## 2017-11-25 NOTE — Progress Notes (Signed)
   S:    Patient arrives in good spirits and ambulating without assistance. Daughter and great-grand son (10 mo old) present with visit.    Presents for lung function evaluation.   Patient was referred on 11/22/2017.  Patient was last seen by Primary Care Provider on 11/12/2017. Patient reports breathing has been difficult with frequent visits to the ED (last visit 08/23/2017) for bronchitis, SOB, and coughing spells. Patient reports last dose of COPD medications was this morning (~10:00 AM)  Age when started using tobacco on a daily basis 62 yo. Number of Cigarettes per day 4. Brand smoked [employer] brand. Estimated Nicotine Content per Cigarette (mg) 1.0.  Estimated Nicotine intake per day 4 mg.   Smokes first cigarette immediately after waking. Smokes multiple times per night with frequent night time awakenings to smoke.    Most recent quit attempt last year (2018) but has since returned to smoking. Longest time ever been tobacco free 2 days. Medications (NRT, bupropion, varenicline) used in prior in past cessation efforts include: patch, gum, and lozenge.   Most common triggers to use tobacco include; while drinking, after meals, and when worried/anxious.   Motivation to quit: Grandson, avoidance of stroke/MI, and cost avoidance.  O: mMRC score= >2 CAT score= 22 See Documentation Flowsheet - CAT/COPD for complete symptom scoring.  See "scanned report" or Documentation Flowsheet (discrete results - PFTs) for  Spirometry results. Patient provided good effort while attempting spirometry.   Lung Age = 95 Albuterol Neb  Lot# [id number]     Exp. 04/10/2019  A/P: Spirometry evaluation reveals mild/moderate obstructive lung disease corresponding to GOLD Classification B based on spirometry Stage 2, one (1) exacerbation in the past year not leading to a hospitalization, and CAT score of >10. Additionally, spirometry revealed reversibility upon bronchodilator treatment (FVC increased 12%) indicating  asthma/COPD overlap syndrome. Patient has been experiencing SOB/trouble breathing for COPD/asthma overlap and has been taking an albuterol MDI.  Initiate Trelegy Ellipta (fluticasone furoate, umeclidinium, and vilanterol) 100/62.5/25 mcg once daily. Trelegy contains an ICS/LABA combo to treat moderate asthma and a LAMA for COPD.  Unsure patient has been able to administer their albuterol MDI appropriately.Educated patient on purpose, proper use, potential adverse effects including increased risk of esophageal candidiasis and need to rinse mouth after each use.  Reviewed results of pulmonary function tests. Counseled on proper technique. Would like to decrease the albuterol use to less than daily.    Mild Nicotine Dependence of 49 years duration in a patient who is fair candidate for success b/c of support from daughter. Pt is a current smoker with a 9+ pack per year smoking history. Only smokes a small portion of each cigarette,  Triggers include worry, after meals and with alcohol.  Written information provided.   Written pt instructions provided.  F/U Clinic visit in a month with Dr. Brett Albino or Rx clinic.   Total time in face to face counseling 45 minutes.  Patient seen with Hildred Alamin, PharmD Candidate.

## 2017-12-02 DIAGNOSIS — R0602 Shortness of breath: Secondary | ICD-10-CM | POA: Insufficient documentation

## 2017-12-02 HISTORY — DX: Shortness of breath: R06.02

## 2017-12-02 NOTE — Assessment & Plan Note (Signed)
Well-controlled. A1c 5.8% today. - Continue Lantus 5 units daily - Can consider stopping Lantus in the future

## 2017-12-02 NOTE — Assessment & Plan Note (Signed)
Patient with shortness of breath, wheezing, dyspnea on exertion, and dry cough. Improved with Albuterol. Concern for undiagnosed COPD given that she has smoked 1 ppd x 48 years. Could also be related to her Wegener's. Undiagnosed CHF less likely without any signs of fluid overload but remains a possibility. - Will order Albuterol nebulizer, as patient feels this helps her cough and SOB much more than the inhaler.  - Advised patient to schedule an appointment with Dr. Valentina Lucks for PFTs - Follow-up in 1 month

## 2017-12-04 ENCOUNTER — Other Ambulatory Visit: Payer: Self-pay | Admitting: Internal Medicine

## 2017-12-05 ENCOUNTER — Encounter (HOSPITAL_COMMUNITY)
Admission: RE | Admit: 2017-12-05 | Discharge: 2017-12-05 | Disposition: A | Discharge status: Home / Self Care | Payer: Medicaid Other | Source: Ambulatory Visit | Attending: Nephrology | Admitting: Nephrology

## 2017-12-05 VITALS — BP 158/81 | HR 62 | Resp 18

## 2017-12-05 DIAGNOSIS — N170 Acute kidney failure with tubular necrosis: Secondary | ICD-10-CM | POA: Insufficient documentation

## 2017-12-05 LAB — IRON AND TIBC
IRON: 55 ug/dL (ref 28–170)
SATURATION RATIOS: 17 % (ref 10.4–31.8)
TIBC: 319 ug/dL (ref 250–450)
UIBC: 264 ug/dL

## 2017-12-05 LAB — FERRITIN: FERRITIN: 137 ng/mL (ref 11–307)

## 2017-12-05 LAB — POCT HEMOGLOBIN-HEMACUE: Hemoglobin: 11.1 g/dL — ABNORMAL LOW (ref 12.0–15.0)

## 2017-12-05 MED ORDER — DARBEPOETIN ALFA 150 MCG/0.3ML IJ SOSY
150.0000 ug | PREFILLED_SYRINGE | INTRAMUSCULAR | Status: DC
  Administered 2017-12-05: 150 ug via SUBCUTANEOUS

## 2017-12-05 MED ORDER — DARBEPOETIN ALFA 150 MCG/0.3ML IJ SOSY
PREFILLED_SYRINGE | INTRAMUSCULAR | Status: AC
  Administered 2017-12-05: 150 ug via SUBCUTANEOUS
  Filled 2017-12-05: qty 0.3

## 2017-12-23 ENCOUNTER — Ambulatory Visit: Payer: Medicaid Other | Admitting: Internal Medicine

## 2017-12-26 ENCOUNTER — Ambulatory Visit: Payer: Medicaid Other | Admitting: Pharmacist

## 2018-01-01 ENCOUNTER — Other Ambulatory Visit: Payer: Self-pay | Admitting: Internal Medicine

## 2018-01-02 ENCOUNTER — Ambulatory Visit (HOSPITAL_COMMUNITY)
Admission: RE | Admit: 2018-01-02 | Discharge: 2018-01-02 | Disposition: A | Discharge status: Home / Self Care | Payer: Medicaid Other | Source: Ambulatory Visit | Attending: Nephrology | Admitting: Nephrology

## 2018-01-02 VITALS — BP 164/75 | HR 63 | Temp 98.6°F | Resp 18

## 2018-01-02 DIAGNOSIS — N170 Acute kidney failure with tubular necrosis: Secondary | ICD-10-CM

## 2018-01-02 DIAGNOSIS — N184 Chronic kidney disease, stage 4 (severe): Secondary | ICD-10-CM | POA: Diagnosis present

## 2018-01-02 DIAGNOSIS — D631 Anemia in chronic kidney disease: Secondary | ICD-10-CM | POA: Diagnosis present

## 2018-01-02 LAB — IRON AND TIBC
IRON: 68 ug/dL (ref 28–170)
Saturation Ratios: 20 % (ref 10.4–31.8)
TIBC: 346 ug/dL (ref 250–450)
UIBC: 278 ug/dL

## 2018-01-02 LAB — POCT HEMOGLOBIN-HEMACUE: Hemoglobin: 11 g/dL — ABNORMAL LOW (ref 12.0–15.0)

## 2018-01-02 LAB — FERRITIN: FERRITIN: 112 ng/mL (ref 11–307)

## 2018-01-02 MED ORDER — DARBEPOETIN ALFA 150 MCG/0.3ML IJ SOSY
150.0000 ug | PREFILLED_SYRINGE | INTRAMUSCULAR | Status: DC
  Administered 2018-01-02: 150 ug via SUBCUTANEOUS

## 2018-01-02 MED ORDER — DARBEPOETIN ALFA 150 MCG/0.3ML IJ SOSY
PREFILLED_SYRINGE | INTRAMUSCULAR | Status: AC
  Administered 2018-01-02: 150 ug via SUBCUTANEOUS
  Filled 2018-01-02: qty 0.3

## 2018-01-21 ENCOUNTER — Other Ambulatory Visit (HOSPITAL_COMMUNITY)
Admission: RE | Admit: 2018-01-21 | Discharge: 2018-01-21 | Disposition: A | Discharge status: Home / Self Care | Payer: Medicaid Other | Source: Ambulatory Visit | Attending: Internal Medicine | Admitting: Internal Medicine

## 2018-01-21 ENCOUNTER — Ambulatory Visit: Payer: Medicaid Other | Admitting: Internal Medicine

## 2018-01-21 ENCOUNTER — Encounter: Payer: Self-pay | Admitting: Internal Medicine

## 2018-01-21 ENCOUNTER — Other Ambulatory Visit (INDEPENDENT_AMBULATORY_CARE_PROVIDER_SITE_OTHER): Payer: Medicaid Other

## 2018-01-21 VITALS — BP 130/80 | HR 72 | Ht 67.0 in | Wt 210.4 lb

## 2018-01-21 DIAGNOSIS — Z79899 Other long term (current) drug therapy: Secondary | ICD-10-CM

## 2018-01-21 DIAGNOSIS — R05 Cough: Secondary | ICD-10-CM

## 2018-01-21 DIAGNOSIS — M318 Other specified necrotizing vasculopathies: Secondary | ICD-10-CM

## 2018-01-21 DIAGNOSIS — J019 Acute sinusitis, unspecified: Secondary | ICD-10-CM | POA: Diagnosis not present

## 2018-01-21 DIAGNOSIS — Z5181 Encounter for therapeutic drug level monitoring: Secondary | ICD-10-CM | POA: Diagnosis not present

## 2018-01-21 DIAGNOSIS — R053 Chronic cough: Secondary | ICD-10-CM

## 2018-01-21 LAB — CBC WITH DIFFERENTIAL/PLATELET
BASOS ABS: 0.1 10*3/uL (ref 0.0–0.1)
Basophils Relative: 0.7 % (ref 0.0–3.0)
EOS ABS: 0 10*3/uL (ref 0.0–0.7)
Eosinophils Relative: 0.5 % (ref 0.0–5.0)
HEMATOCRIT: 33.3 % — AB (ref 36.0–46.0)
Hemoglobin: 10.7 g/dL — ABNORMAL LOW (ref 12.0–15.0)
LYMPHS PCT: 24.9 % (ref 12.0–46.0)
Lymphs Abs: 1.8 10*3/uL (ref 0.7–4.0)
MCHC: 32.2 g/dL (ref 30.0–36.0)
MCV: 80.7 fl (ref 78.0–100.0)
Monocytes Absolute: 0.4 10*3/uL (ref 0.1–1.0)
Monocytes Relative: 6 % (ref 3.0–12.0)
NEUTROS ABS: 4.8 10*3/uL (ref 1.4–7.7)
NEUTROS PCT: 67.9 % (ref 43.0–77.0)
PLATELETS: 336 10*3/uL (ref 150.0–400.0)
RBC: 4.12 Mil/uL (ref 3.87–5.11)
RDW: 16.4 % — ABNORMAL HIGH (ref 11.5–15.5)
WBC: 7 10*3/uL (ref 4.0–10.5)

## 2018-01-21 LAB — BASIC METABOLIC PANEL
BUN: 47 mg/dL — ABNORMAL HIGH (ref 6–23)
CALCIUM: 9.5 mg/dL (ref 8.4–10.5)
CO2: 24 meq/L (ref 19–32)
CREATININE: 2.73 mg/dL — AB (ref 0.40–1.20)
Chloride: 108 mEq/L (ref 96–112)
GFR: 22.61 mL/min — ABNORMAL LOW (ref >60.00)
Glucose, Bld: 164 mg/dL — ABNORMAL HIGH (ref 70–99)
Potassium: 4.8 mEq/L (ref 3.5–5.1)
Sodium: 140 mEq/L (ref 135–145)

## 2018-01-21 LAB — HEPATIC FUNCTION PANEL
ALBUMIN: 4.1 g/dL (ref 3.5–5.2)
ALK PHOS: 57 U/L (ref 39–117)
ALT: 16 U/L (ref 0–35)
AST: 14 U/L (ref 0–37)
Bilirubin, Direct: 0 mg/dL (ref 0.0–0.3)
TOTAL PROTEIN: 6.9 g/dL (ref 6.0–8.3)
Total Bilirubin: 0.2 mg/dL (ref 0.2–1.2)

## 2018-01-21 LAB — NITRIC OXIDE: NITRIC OXIDE: 8

## 2018-01-21 MED ORDER — DOXYCYCLINE HYCLATE 100 MG PO TABS: 100.0000 mg | ORAL_TABLET | Freq: Two times a day (BID) | ORAL | 0 refills | Status: DC

## 2018-01-21 NOTE — Progress Notes (Signed)
Subjective:     Patient ID: Catherine Aguilar, female   DOB: 1956-05-22, 62 y.o.   MRN: [id number]  HPI  OV 02/12/2017  Chief Complaint  Patient presents with  . Follow-up    Pt states her breathing is doing well. Pt denies any cough, SOB, CP/tightness, and f/c/s.    Follow-up severe mid vasculitis syndrome with life-threatening alveolar hemorrhage and acute renal failure is easter weekend 2018  Currently she is doing well. She has finished 4 doses of Rituxan. Last dose was 01/31/2017. There was significant delay between the first 2 doses which were on schedule in the third and fourth doses delayed. Still on prednisone 60 mg per day. She is accompanied by her daughter [name]. Overall she's doing well. This no hemoptysis anymore. This no shortness of breath. Her G6PD was normal in May 2018. Her Quantiferon Gold was normal. Her most recent chemistries were in May 2018 and a creatinine that improved with 3 mg percent approximately.  OV 05/03/2017  Chief Complaint  Patient presents with  . Follow-up    Pt states her breathing is unchanged since last OV. Pt denies cough, CP/tightness, f/c/s.       62 year old female with systemic vasculitis syndrome. Last dose of Rituxan was in May 2018. She is currently tapered her prednisone down to 10 mg per day. She is on Bactrim prophylaxis. She has visited with nephrology Dr. Lu Duffel in the last 1 month. According to her history blood tests have improved. She does not want to have repeat blood test today. Last blood test was within a month. She is getting iron infusions at short stay. According to the daughter blood pressure was high and one of the infusions was held off. There is no hemoptysis chest pain or hematuria or any clinical evidence of vasculitis. She is due for flu shot.   OV 09/19/2017  Chief Complaint  Patient presents with  . Follow-up    Pt was seen at the hospital 09/02/17 due to breathing problems and after doing breathing  treatments, was sent home. Other than that, pt states that she has been doing good.     62 year old female with systemic vasculitis syndrome.  Last dose of Rituxan May 2018.  She had life-threatening hemoptysis and respiratory failure and renal failure back in early 2018.  Last seen in August 2018.  After that she did not follow-up.  She is here with her daughter.  It appears that Christmas Eve 2018 she decided to smoke cigarettes because of the holidays when she got acute respiratory exacerbation with wheezing and went to the emergency department.  I personally reviewed the chart and the chest x-ray that was clear.  She was discharged with a steroid burst.  Now she is back to baseline prednisone 10 mg/day and her baseline Bactrim for her Wegener's vasculitis.  She tells me that she is getting Rituxan shots once a month at the short stay hospital at [facility] but when I reviewed the chart this is actually [facility].  She is due for a B-cell flow cytometry test today because it has been 9-10 months.  Overall she is feeling fine except fatigue.  She tells me that she does not have any eye issues or neuritis of mononeuritis multiplex or renal issues although most recent creatinine continues to be on 3 mg percent.  There is no hemoptysis or shortness of breath currently.   OV 01/21/2018  Chief Complaint  Patient presents with  . Follow-up    Pt  states she has been doing good since last visit but states she has an uncontrollable cough that she has had since March 2018 that will not go away. Pt is not coughing up any mucus but does have postnasal drainage that is clear.    62 year old female with systemic vasculitis syndrome with life-threatening hemoptysis and respiratory and renal failure in early 2018.Marland Kitchen  Last dose of Rituxan May 2018.  Since then on daily prednisone and Bactrim prophylaxis.  Last B-cell flow cytometry January 2019 showing suppression of B cells.  She presents with her daughter.  She as  usual is a poor historian.  As best as I can gather it appears since her last visit she is having recurrent episodes of bronchitis and has been treated with 2 rounds of antibiotics/2 rounds of prednisone burst including a February 2019 visit to the emergency department where she had a chest x-ray that I personally visualized and it looks clear.  She also feels that she is having a chronic cough that is worse than her baseline.  She tells me now that she has had this cough since March 2018 when she suffered from vasculitis syndrome but the daughter says that it is certainly worse in the last few months since the last visit associated with chest tightness.  There is no hemoptysis or edema.  In the last week or so she feels like she has had an acute sinusitis with yellow sputum and she wants a different antibiotic.  There is no fever or hemoptysis or edema orthopnea proximal nocturnal dyspnea or weight loss. FeNO 8ppb and does not suggest asthma   has a past medical history of Allergy, Anemia, Chronic kidney disease, High cholesterol, adenomatous polyp of colon (04/16/2017), Hypertension, Stroke [facility]), and Type 2 diabetes mellitus without complication, with long-term current use of insulin ([place]) (02/11/2017).   reports that she has been smoking cigarettes.  She started smoking about 49 years ago. She has a 9.80 pack-year smoking history. She has never used smokeless tobacco.  Past Surgical History:  Procedure Laterality Date  . ABDOMINAL HYSTERECTOMY  1985    No Known Allergies  Immunization History  Administered Date(s) Administered  . Influenza Split 07/02/2012  . Influenza,inj,Quad PF,6+ Mos 05/03/2017  . Pneumococcal Polysaccharide-23 07/02/2012, 11/22/2017    Family History  Problem Relation Age of Onset  . Cancer Father   . Alcohol abuse Father   . Diabetes Father   . Hypertension Father   . Heart attack Mother   . Diabetes Mother   . Hypertension Mother   . Stroke Mother   . Breast  cancer Neg Hx   . Colon cancer Neg Hx   . Esophageal cancer Neg Hx   . Rectal cancer Neg Hx   . Stomach cancer Neg Hx   . Pancreatic cancer Neg Hx      Current Outpatient Medications:  .  ACCU-CHEK AVIVA PLUS test strip, USE TO CHECK BLOOD SUGAR IN THE MORNING AND IN THE EVENING, Disp: 100 each, Rfl: 4 .  albuterol (PROVENTIL HFA;VENTOLIN HFA) 108 (90 Base) MCG/ACT inhaler, INHALE 2 PUFFS EVERY 4-6 HOURS, Disp: 6.7 Inhaler, Rfl: 1 .  albuterol (PROVENTIL) (2.5 MG/3ML) 0.083% nebulizer solution, Take 3 mLs (2.5 mg total) by nebulization every 6 (six) hours as needed for wheezing or shortness of breath., Disp: 150 mL, Rfl: 1 .  aspirin 81 MG EC tablet, TAKE 1 TABLET BY MOUTH EVERY DAY, Disp: 90 tablet, Rfl: 0 .  B-D UF III MINI PEN NEEDLES 31G  X 5 MM MISC, 1 UNITS BY DOES NOT APPLY ROUTE 3 (THREE) TIMES DAILY., Disp: 100 each, Rfl: 0 .  Blood Glucose Monitoring Suppl (ACCU-CHEK AVIVA PLUS) w/Device KIT, Check blood sugar as instructed, Disp: 1 kit, Rfl: 0 .  Fluticasone-Umeclidin-Vilant (TRELEGY ELLIPTA) 100-62.5-25 MCG/INH AEPB, Inhale 1 puff into the lungs daily., Disp: , Rfl:  .  hydrALAZINE (APRESOLINE) 100 MG tablet, Take 100 mg by mouth 3 (three) times daily., Disp: , Rfl:  .  Insulin Glargine (LANTUS SOLOSTAR) 100 UNIT/ML Solostar Pen, Inject 5 Units into the skin daily at 10 pm. (Patient taking differently: Inject 5 Units into the skin every evening. ), Disp: 5 pen, Rfl: PRN .  labetalol (NORMODYNE) 300 MG tablet, Take 300 mg by mouth 2 (two) times daily., Disp: , Rfl:  .  MICROLET LANCETS MISC, Use as directed to test three times a day. ICD-10 code: E11.9.  Please use Counselling psychologist., Disp: 100 each, Rfl: 3 .  pravastatin (PRAVACHOL) 40 MG tablet, TAKE 1 TABLET BY MOUTH AT BEDTIME FOR CHOLESTEROL, Disp: 90 tablet, Rfl: 3 .  predniSONE (DELTASONE) 10 MG tablet, 10 mg daily, Disp: 30 tablet, Rfl: 1 .  sulfamethoxazole-trimethoprim (BACTRIM DS,SEPTRA DS) 800-160 MG tablet,  TAKE 1 TABLET BY MOUTH 3 (THREE) TIMES A WEEK. (Patient taking differently: Take 1 tablet by mouth every Monday, Wednesday, and Friday. ), Disp: 12 tablet, Rfl: 5 .  triamcinolone (KENALOG) 0.025 % ointment, Apply 1 application topically 2 (two) times daily. (Patient not taking: Reported on 01/21/2018), Disp: 30 g, Rfl: 1  Current Facility-Administered Medications:  .  0.9 %  sodium chloride infusion, 500 mL, Intravenous, Continuous, Carlean Purl Ofilia Neas, MD   Review of Systems     Objective:   Physical Exam  Constitutional: She is oriented to person, place, and time. She appears well-developed and well-nourished. No distress.  HENT:  Head: Normocephalic and atraumatic.  Right Ear: External ear normal.  Left Ear: External ear normal.  Mouth/Throat: Oropharynx is clear and moist. No oropharyngeal exudate.  Eyes: Pupils are equal, round, and reactive to light. Conjunctivae and EOM are normal. Right eye exhibits no discharge. Left eye exhibits no discharge. No scleral icterus.  Neck: Normal range of motion. Neck supple. No JVD present. No tracheal deviation present. No thyromegaly present.  Cardiovascular: Normal rate, regular rhythm, normal heart sounds and intact distal pulses. Exam reveals no gallop and no friction rub.  No murmur heard. Pulmonary/Chest: Effort normal and breath sounds normal. No respiratory distress. She has no wheezes. She has no rales. She exhibits no tenderness.  Mild nasal twang Intermittent scattered transmitted upper airway wheeze  Abdominal: Soft. Bowel sounds are normal. She exhibits no distension and no mass. There is no tenderness. There is no rebound and no guarding.  Musculoskeletal: Normal range of motion. She exhibits no edema or tenderness.  Lymphadenopathy:    She has no cervical adenopathy.  Neurological: She is alert and oriented to person, place, and time. She has normal reflexes. No cranial nerve deficit. She exhibits normal muscle tone. Coordination  normal.  Skin: Skin is warm and dry. No rash noted. She is not diaphoretic. No erythema. No pallor.  Psychiatric: She has a normal mood and affect. Her behavior is normal. Judgment and thought content normal.  Vitals reviewed.  Today's Vitals   01/21/18 1003  BP: 130/80  Pulse: 72  SpO2: 98%  Weight: 210 lb 6.4 oz (95.4 kg)  Height: '5\' 7"'  (1.702 m)    Estimated body  mass index is 32.95 kg/m as calculated from the following:   Height as of this encounter: '5\' 7"'  (1.702 m).   Weight as of this encounter: 210 lb 6.4 oz (95.4 kg).      Assessment:       ICD-10-CM   1. Systemic vasculitis syndrome ([place]) M31.8   2. High risk medication use Z79.899   3. Encounter for therapeutic drug level monitoring Z51.81   4. Acute sinusitis, recurrence not specified, unspecified location J01.90   5. Chronic cough R05        Plan:      Systemic vasculitis syndrome (HCC) High risk medication use Encounter for therapeutic drug level monitoring  - do cbc with diff, bmet, lft,  01/21/2018  - do B cell flow cytometry 01/21/2018 - will call with results to decide on restart rituxan - till then continue bactrim 3 times a week and daily prednisone at 64m per day  Acute sinusitis, recurrence not specified, unspecified location - new Chronic cough - new - FeNO does NOT suggest asthma - Take doxycycline 1059mpo twice daily x 5 days; take after meals and avoid sunlight (hold bactrim while taking this) - if FeNo test normal concerned vasculitis coming back but above blood work can be helpful   Followup 3 months or sooner if needed     Dr. MuBrand MalesM.D., F.[facility].P Pulmonary and Critical Care Medicine Staff Physician, [place] - Interstitial Lung Disease  Program  Pulmonary [id number]: [phone] no answer or between  15:00h - 7:00h: call [phone] Telephone: [phone]

## 2018-01-21 NOTE — Patient Instructions (Signed)
ICD-10-CM   1. Systemic vasculitis syndrome ([place]) M31.8   2. High risk medication use Z79.899   3. Encounter for therapeutic drug level monitoring Z51.81   4. Acute sinusitis, recurrence not specified, unspecified location J01.90   5. Chronic cough R05    Systemic vasculitis syndrome (HCC) High risk medication use Encounter for therapeutic drug level monitoring  - do cbc with diff, bmet, lft,  01/21/2018  - do B cell flow cytometry 01/21/2018 - will call with results to decide on restart rituxan - till then continue bactrim 3 times a week and daily prednisone at 5mg  per day  Acute sinusitis, recurrence not specified, unspecified location Chronic cough  - Take doxycycline 100mg  po twice daily x 5 days; take after meals and avoid sunlight (hold bactrim while taking this) - do FeNO test 01/21/2018 - if FeNo test normal concerned vasculitis coming back but above blood work can be helpful   Followup 3 months or sooner if needed

## 2018-01-30 ENCOUNTER — Ambulatory Visit (HOSPITAL_COMMUNITY)
Admission: RE | Admit: 2018-01-30 | Discharge: 2018-01-30 | Disposition: A | Discharge status: Home / Self Care | Payer: Medicaid Other | Source: Ambulatory Visit | Attending: Nephrology | Admitting: Nephrology

## 2018-01-30 VITALS — BP 149/79 | HR 66 | Temp 98.9°F | Resp 20

## 2018-01-30 DIAGNOSIS — N170 Acute kidney failure with tubular necrosis: Secondary | ICD-10-CM | POA: Diagnosis present

## 2018-01-30 LAB — FERRITIN: Ferritin: 93 ng/mL (ref 11–307)

## 2018-01-30 LAB — IRON AND TIBC
IRON: 54 ug/dL (ref 28–170)
SATURATION RATIOS: 17 % (ref 10.4–31.8)
TIBC: 321 ug/dL (ref 250–450)
UIBC: 267 ug/dL

## 2018-01-30 LAB — POCT HEMOGLOBIN-HEMACUE: HEMOGLOBIN: 10.9 g/dL — AB (ref 12.0–15.0)

## 2018-01-30 MED ORDER — DARBEPOETIN ALFA 150 MCG/0.3ML IJ SOSY
150.0000 ug | PREFILLED_SYRINGE | INTRAMUSCULAR | Status: DC
  Administered 2018-01-30: 150 ug via SUBCUTANEOUS

## 2018-01-30 MED ORDER — DARBEPOETIN ALFA 150 MCG/0.3ML IJ SOSY
PREFILLED_SYRINGE | INTRAMUSCULAR | Status: AC
  Filled 2018-01-30: qty 0.3

## 2018-02-02 ENCOUNTER — Other Ambulatory Visit: Payer: Self-pay | Admitting: Internal Medicine

## 2018-02-20 ENCOUNTER — Telehealth: Payer: Self-pay | Admitting: Internal Medicine

## 2018-02-20 NOTE — Telephone Encounter (Signed)
Called patient unable to reach left message to give us a call back.

## 2018-02-20 NOTE — Telephone Encounter (Signed)
Let Arnesha Escudero klnow that B cells are beginning to re-emerge - means the rituxan from a year ago might be wearing off. However, is not at significant level .    Plan rov in 3 months - maybe 3-5 days before she sees me - do ANCA scree, MPO, PR-3 antibodies and B cell flow. If this is inconvenient we can do it on day of visit too

## 2018-02-21 NOTE — Telephone Encounter (Signed)
Called patient. Patients daughter answered the phone. Patients daughter requested that I give her the information. Advised patients daughter that I could not give information without the consent of the patient since there was no DPR on the file. Advised patients daughter she could come up and sign the form or allow me to give one time consent over the phone. Patients daughter hung up and said she would have the patient call.

## 2018-02-26 ENCOUNTER — Other Ambulatory Visit (HOSPITAL_COMMUNITY): Payer: Self-pay

## 2018-02-26 ENCOUNTER — Other Ambulatory Visit: Payer: Self-pay | Admitting: Internal Medicine

## 2018-02-26 NOTE — Telephone Encounter (Signed)
Spoke with patient and daughter with patients consent, made aware of results per MR. Appt made for 05/26/18 at 10:15am. Labs requested faxed to [facility] long lab. Will place confirmation sheet in scan folder. No further questions or concerns at this time.

## 2018-02-27 ENCOUNTER — Inpatient Hospital Stay (HOSPITAL_COMMUNITY): Admission: RE | Admit: 2018-02-27 | Payer: Medicaid Other | Source: Ambulatory Visit

## 2018-03-04 ENCOUNTER — Ambulatory Visit (HOSPITAL_COMMUNITY)
Admission: RE | Admit: 2018-03-04 | Discharge: 2018-03-04 | Disposition: A | Discharge status: Home / Self Care | Payer: Medicaid Other | Source: Ambulatory Visit | Attending: Nephrology | Admitting: Nephrology

## 2018-03-04 VITALS — BP 129/79 | HR 62 | Resp 18

## 2018-03-04 DIAGNOSIS — N170 Acute kidney failure with tubular necrosis: Secondary | ICD-10-CM

## 2018-03-04 DIAGNOSIS — N184 Chronic kidney disease, stage 4 (severe): Secondary | ICD-10-CM | POA: Diagnosis present

## 2018-03-04 DIAGNOSIS — D631 Anemia in chronic kidney disease: Secondary | ICD-10-CM | POA: Insufficient documentation

## 2018-03-04 LAB — IRON AND TIBC
Iron: 58 ug/dL (ref 28–170)
Saturation Ratios: 18 % (ref 10.4–31.8)
TIBC: 330 ug/dL (ref 250–450)
UIBC: 272 ug/dL

## 2018-03-04 LAB — FERRITIN: Ferritin: 87 ng/mL (ref 11–307)

## 2018-03-04 LAB — POCT HEMOGLOBIN-HEMACUE: HEMOGLOBIN: 10.9 g/dL — AB (ref 12.0–15.0)

## 2018-03-04 MED ORDER — DARBEPOETIN ALFA 150 MCG/0.3ML IJ SOSY
150.0000 ug | PREFILLED_SYRINGE | INTRAMUSCULAR | Status: DC
  Administered 2018-03-04: 150 ug via SUBCUTANEOUS

## 2018-03-04 MED ORDER — DARBEPOETIN ALFA 150 MCG/0.3ML IJ SOSY
PREFILLED_SYRINGE | INTRAMUSCULAR | Status: AC
  Filled 2018-03-04: qty 0.3

## 2018-03-10 ENCOUNTER — Telehealth: Payer: Self-pay | Admitting: Internal Medicine

## 2018-03-10 NOTE — Telephone Encounter (Signed)
Spoke with Dr. Lorrene Reid. She is wishing to speak to MR about some of the pt's labs that have increased. Dr. Lorrene Reid is aware that MR is not in the office this week. She would like for him to call her next week once he returns.

## 2018-03-18 NOTE — Telephone Encounter (Signed)
MR is not back at the office until 03/26/18.

## 2018-03-26 NOTE — Telephone Encounter (Signed)
Catherine Aguilar has returned back to the office today. Dr. Lorrene Reid would like to speak to Catherine Aguilar about some of the pt's labs that have increased.  Catherine Aguilar please advise.

## 2018-03-27 ENCOUNTER — Encounter (HOSPITAL_COMMUNITY): Payer: Medicaid Other

## 2018-04-01 ENCOUNTER — Encounter (HOSPITAL_COMMUNITY): Payer: Medicaid Other

## 2018-04-01 IMAGING — CT CT CHEST W/O CM
1 series · 15 of 34 positions shown, 19 images · non-contrast
Comparison: 06/06/2016.

CLINICAL DATA: Pulmonary nodule seen on previous chest CT.

EXAM:
CT CHEST WITHOUT CONTRAST
TECHNIQUE: Multidetector CT imaging of the chest was performed following the
standard protocol without IV contrast.

[Series 2: chest w/(date) · axial · 0.77mm/px · z∈[+889,+1143]mm · 15 of 151 slices shown, 19 images]
[im 12/151  mediastinal]
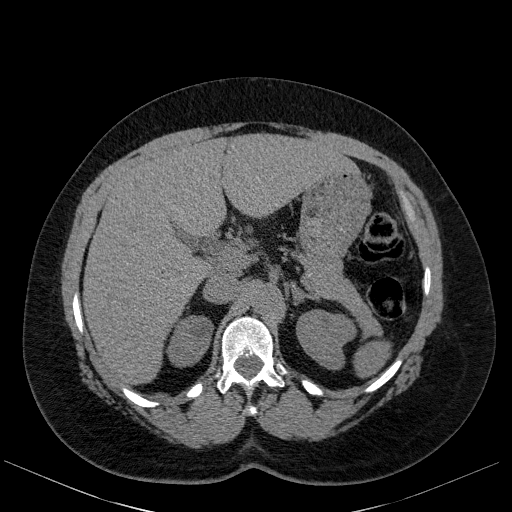
[im 12/151  lung]
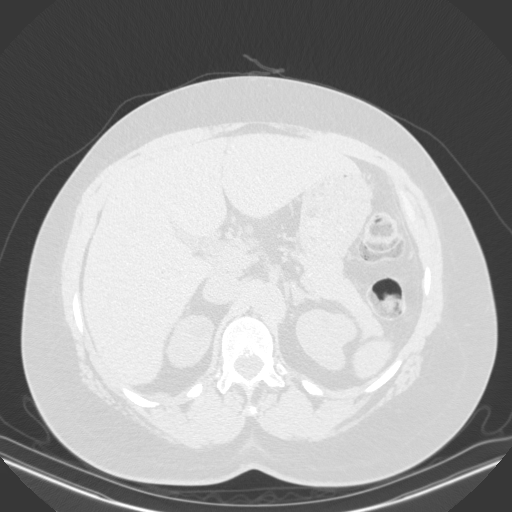
[im 23/151  lung]
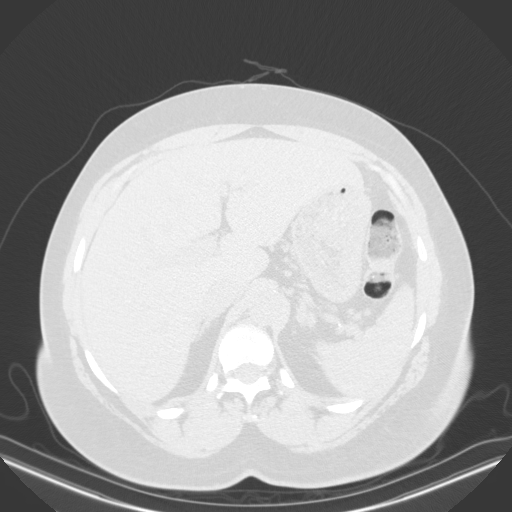
[im 31/151  lung]
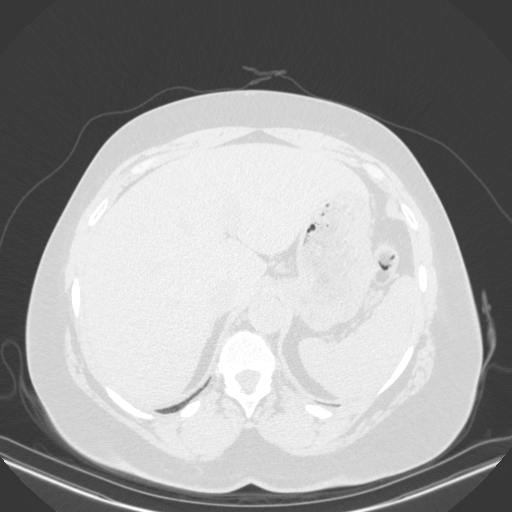
[im 39/151  lung]
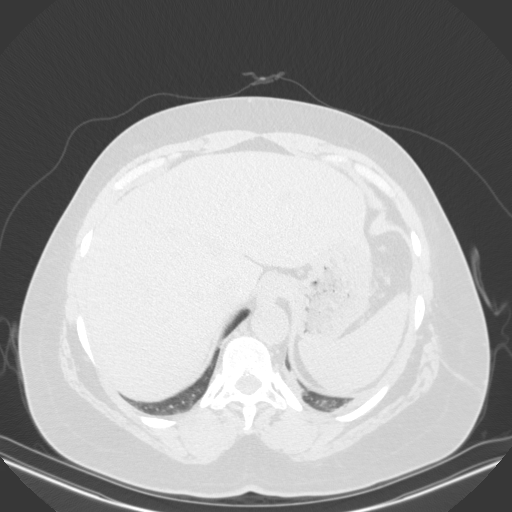
[im 51/151  mediastinal]
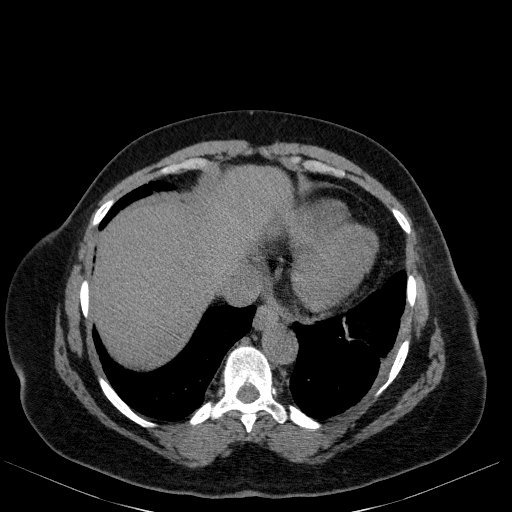
[im 51/151  lung]
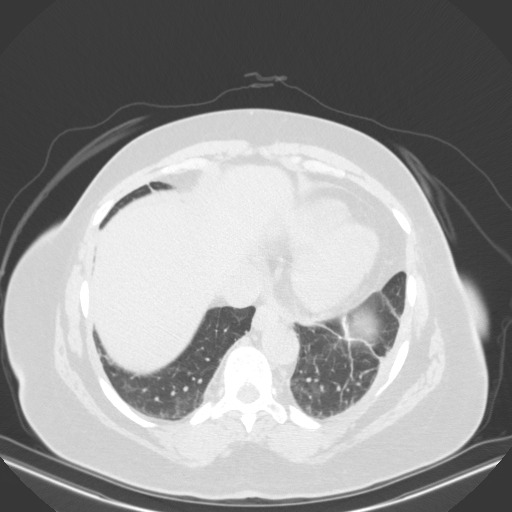
[im 61/151  lung]
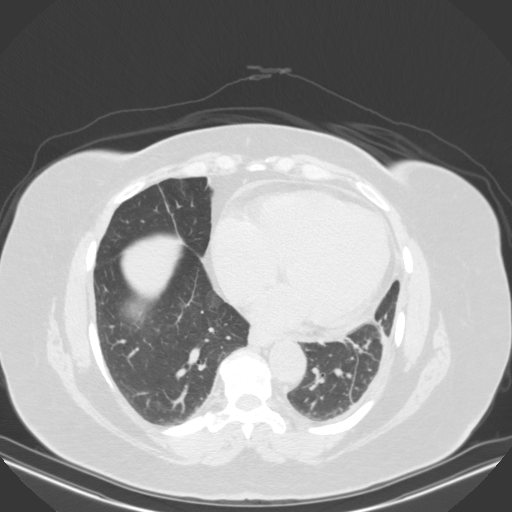
[im 67/151  lung]
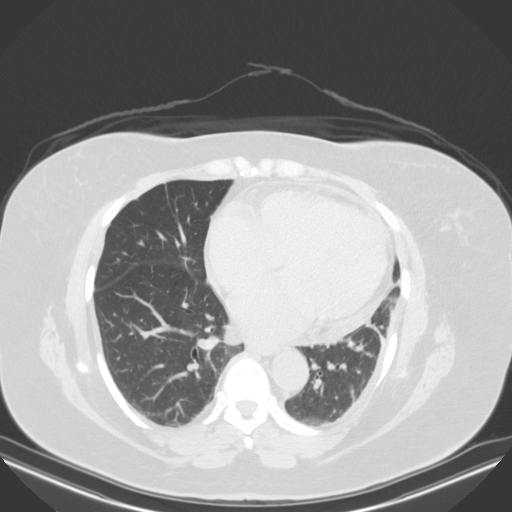
[im 78/151  lung]
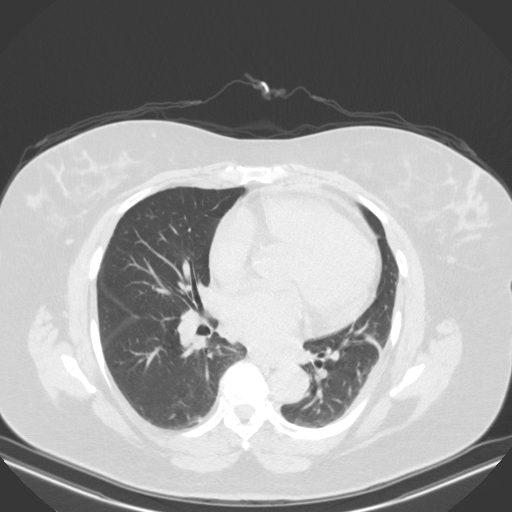
[im 84/151  mediastinal]
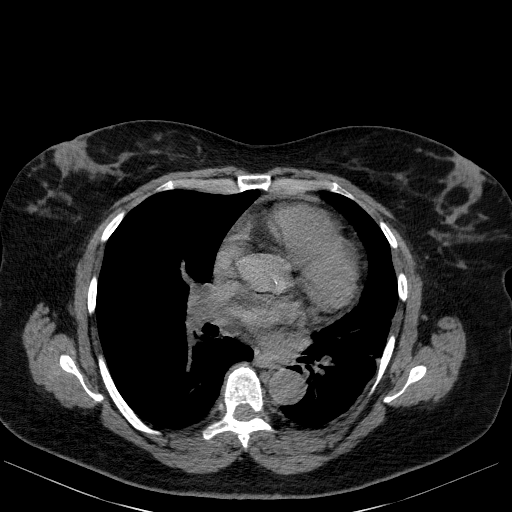
[im 84/151  lung]
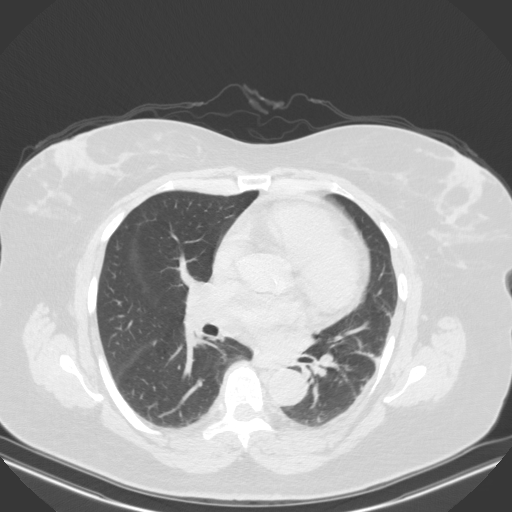
[im 91/151  lung]
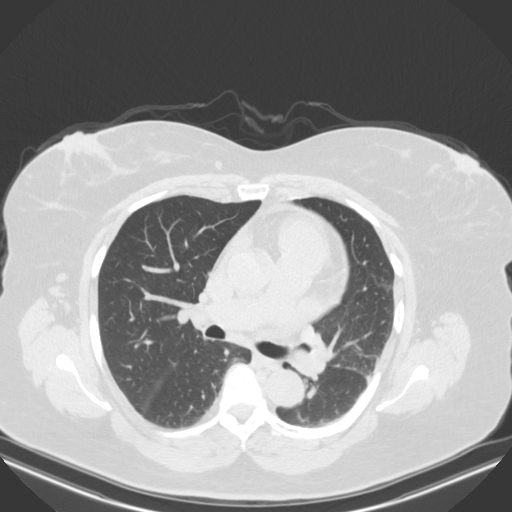
[im 101/151  lung]
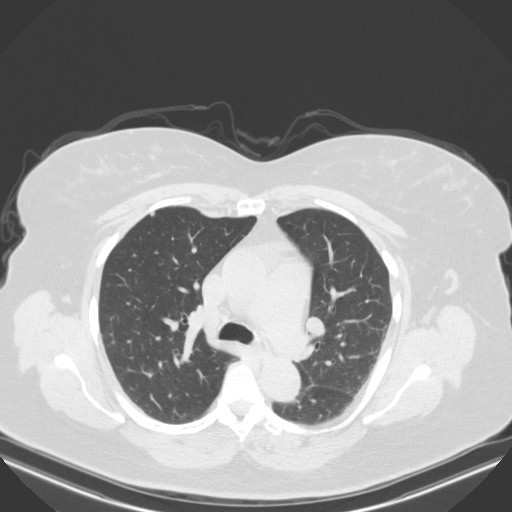
[im 112/151  lung]
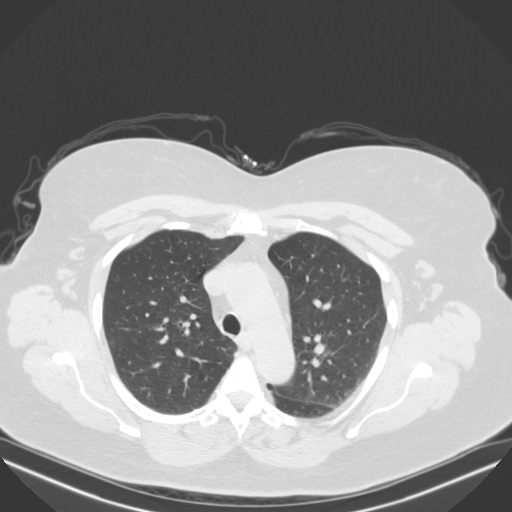
[im 121/151  mediastinal]
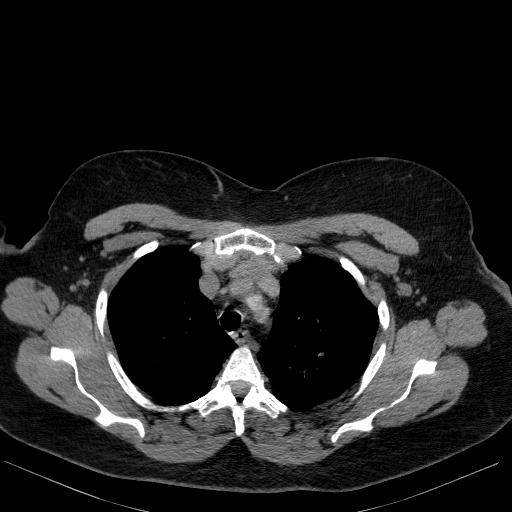
[im 121/151  lung]
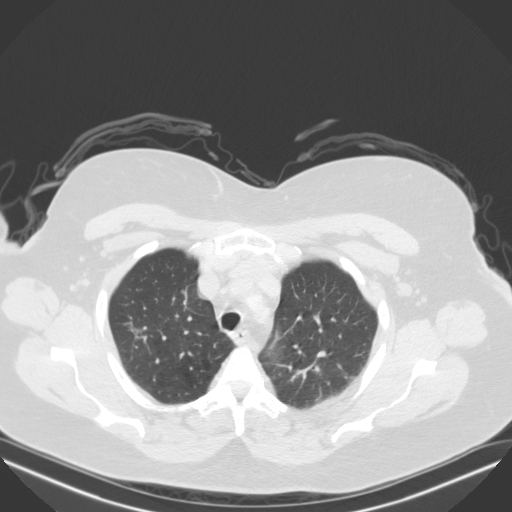
[im 128/151  lung]
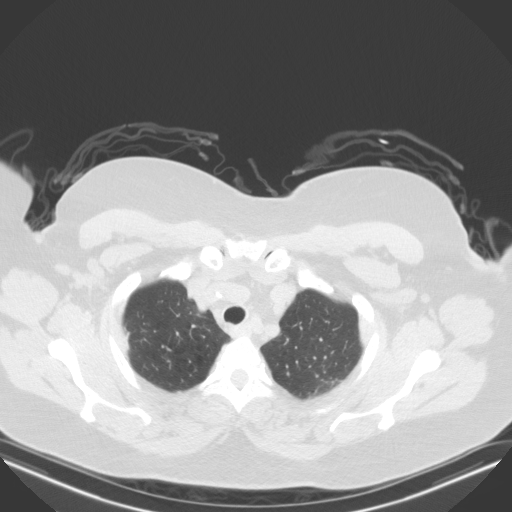
[im 139/151  lung]
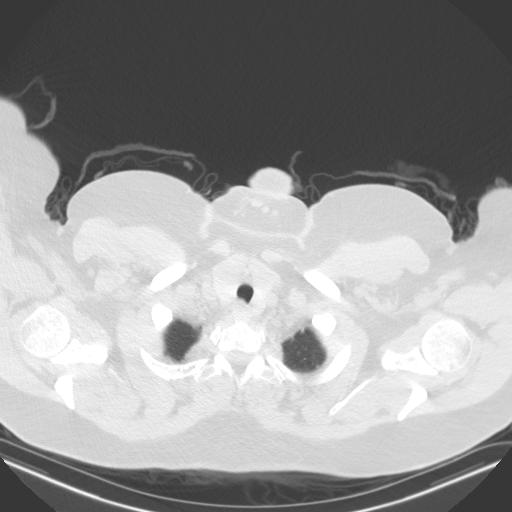

[15 of 34 positions shown; findings below may reference images not displayed]

FINDINGS: Cardiovascular: Heart size is upper normal to borderline enlarged.
Trace pericardial fluid is new in the interval. Coronary artery
calcification is noted. Atherosclerotic calcification is noted in
the wall of the thoracic aorta.

Mediastinum/Nodes: No mediastinal lymphadenopathy. No evidence for
gross hilar lymphadenopathy although assessment is limited by the
lack of intravenous contrast on today's study. The esophagus has
normal imaging features. Stable appearance upper normal axillary
lymph nodes bilaterally.

Lungs/Pleura: The 2.1 cm irregular nodular density seen in the right
lung apex on the previous study has become much less confluent in
the interval, now with ground-glass attenuation measuring 1.1 cm
maximum dimension.

Similar decrease in confluence of the left apical 1.7 cm lesion
noted on the prior study. Although this measures approximately the
same size today, it is much less confluent and the adjacent streaky
probable atelectasis seen on the prior study has resolved.

1.0 cm posterior right upper lobe nodule seen on the previous study
has decreased in size to 6 mm today (image 47 series 5).

Subsegmental atelectasis is seen in the right middle lobe and both
lower lobes, left greater than right, as before. Mild associated
centrilobular emphysema is evident, most notable in the upper lung
centrally.

Upper Abdomen: Water density lesions in the upper pole of each
kidney cannot be definitively characterize but are likely cysts.

Musculoskeletal: Bone windows reveal no worrisome lytic or sclerotic
osseous lesions.
IMPRESSION: 1. The biapical and posterior right upper lobe irregular nodular
opacity seen on the previous study have clearly decreased in the
interval, becoming much less confluent [REDACTED]reasing in size.
Imaging features are most compatible with resolving
infectious/inflammatory etiology. Repeat chest CT in 3 months may
prove helpful to confirm complete resolution.
2. Apparent mild centrilobular emphysema bilaterally.
3. Coronary artery atherosclerosis.

## 2018-04-01 NOTE — Telephone Encounter (Signed)
Spoke with Dr Lorrene Reid.  She is out of town at the present.  Her cell number is [phone].  She was calling to see if MR had received the labs on pt that she had sent over regarding ANCA levels. Please advise.  She asked that we text her with your cell number but unable to do that from triage phone.

## 2018-04-01 NOTE — Telephone Encounter (Signed)
MR please advise. Thanks! 

## 2018-04-01 NOTE — Telephone Encounter (Signed)
I paged herlast week without response. I do not have her cell. Please call her office and give her my cell  Thanks  Dr. Brand Males, M.D., [facility].C.P Pulmonary and Critical Care Medicine Staff Physician, [place] Director - Interstitial Lung Disease  Program  Pulmonary [place] at [place], [place], [address]  Pager: [phone], If no answer or between  15:00h - 7:00h: call [phone] Telephone: [phone]

## 2018-04-01 NOTE — Telephone Encounter (Signed)
Paperwork was received by Dr. Dorian Heckle office and was handed to MR for him to view. Routing message to MR.

## 2018-04-02 NOTE — Telephone Encounter (Signed)
revieed Dr Lorrene Reid note - April 2019 MPO 10.4 <-  9 I n Nov 2018 and July 2018 <- 13 in May 2018  Dr Waynette Buttery concerned that MPO going up against as above  So, I callled Dr Lorrene Reid 1:54 PM 04/02/2018 and she says MPO emerging and clinically looks like she might be relapsing. Also told her B cells beginning to emerge at 3% in May 2019.  Plan Start paper work for Rituxan re-dose - do it now because next visit only in sept 2019   Dr Lorrene Reid supportive of plan   Dr. Brand Males, M.D., [facility].C.P Pulmonary and Critical Care Medicine Staff Physician, [place] Director - Interstitial Lung Disease  Program  Pulmonary [place] at [place], [place], [address]  Pager: [phone], If no answer or between  15:00h - 7:00h: call [phone] Telephone: [phone]

## 2018-04-02 NOTE — Telephone Encounter (Signed)
Unable to find dosing on patient for a re-start. MR please advise. Thanks.

## 2018-04-03 ENCOUNTER — Ambulatory Visit (HOSPITAL_COMMUNITY)
Admission: RE | Admit: 2018-04-03 | Discharge: 2018-04-03 | Disposition: A | Discharge status: Home / Self Care | Payer: Medicaid Other | Source: Ambulatory Visit | Attending: Nephrology | Admitting: Nephrology

## 2018-04-03 VITALS — BP 137/80 | HR 98 | Temp 98.5°F

## 2018-04-03 DIAGNOSIS — N184 Chronic kidney disease, stage 4 (severe): Secondary | ICD-10-CM | POA: Insufficient documentation

## 2018-04-03 DIAGNOSIS — D631 Anemia in chronic kidney disease: Secondary | ICD-10-CM | POA: Diagnosis present

## 2018-04-03 DIAGNOSIS — N170 Acute kidney failure with tubular necrosis: Secondary | ICD-10-CM

## 2018-04-03 LAB — FERRITIN: FERRITIN: 72 ng/mL (ref 11–307)

## 2018-04-03 LAB — POCT HEMOGLOBIN-HEMACUE: Hemoglobin: 11 g/dL — ABNORMAL LOW (ref 12.0–15.0)

## 2018-04-03 LAB — IRON AND TIBC
Iron: 45 ug/dL (ref 28–170)
Saturation Ratios: 12 % (ref 10.4–31.8)
TIBC: 371 ug/dL (ref 250–450)
UIBC: 326 ug/dL

## 2018-04-03 MED ORDER — DARBEPOETIN ALFA 150 MCG/0.3ML IJ SOSY: 150.0000 ug | PREFILLED_SYRINGE | INTRAMUSCULAR | Status: DC

## 2018-04-03 MED ORDER — DARBEPOETIN ALFA 150 MCG/0.3ML IJ SOSY
PREFILLED_SYRINGE | INTRAMUSCULAR | Status: AC
  Administered 2018-04-03: 150 ug
  Filled 2018-04-03: qty 0.3

## 2018-04-04 NOTE — Telephone Encounter (Signed)
Dosing information was found; form completed and given to [facility] for MR to sign and fax to Short Stay.

## 2018-04-06 ENCOUNTER — Other Ambulatory Visit: Payer: Self-pay | Admitting: Internal Medicine

## 2018-04-07 ENCOUNTER — Other Ambulatory Visit: Payer: Self-pay

## 2018-04-07 NOTE — Telephone Encounter (Signed)
Order was signed by MR and has been faxed to [place] at short stay.  Nothing further needed.

## 2018-04-07 NOTE — Telephone Encounter (Signed)
MR please advise when these forms have been signed.

## 2018-04-08 MED ORDER — PRAVASTATIN SODIUM 40 MG PO TABS: ORAL_TABLET | ORAL | 3 refills | Status: DC

## 2018-04-08 MED ORDER — ASPIRIN 81 MG PO TBEC: 81.0000 mg | DELAYED_RELEASE_TABLET | Freq: Every day | ORAL | 0 refills | Status: DC

## 2018-04-08 NOTE — Telephone Encounter (Signed)
Want to check what rituxan dose you all sent for? Should be 1000mg  x 1 dose x IV. IF is another dose let me know   Thanks  Dr. Brand Males, M.D., [facility].C.P Pulmonary and Critical Care Medicine Staff Physician, [place] Director - Interstitial Lung Disease  Program  Pulmonary [place] at [place], [place], [address]  Pager: [phone], If no answer or between  15:00h - 7:00h: call [phone] Telephone: [phone]

## 2018-04-08 NOTE — Telephone Encounter (Signed)
The instructions that were found from pt's previous Rituxan dose was Rituxan 800mg  IV q7days x2 doses and that was the instructions that Clayborne Dana had put on the treatment orders form for pt's Rituxan.  Will fill out another order sheet with the information of 1000mg  x1dose IV.  Have called Short Stay and spoke with Cyril Mourning letting her know that the dosing information will be changed and she stated to just send over a new form.  The corrected form has been filled out and just needs signature from MR.  Routing back to MR.

## 2018-04-11 NOTE — Telephone Encounter (Signed)
Thanks. Will see her mid sept 2019

## 2018-05-01 ENCOUNTER — Encounter (HOSPITAL_COMMUNITY): Payer: Medicaid Other

## 2018-05-06 ENCOUNTER — Other Ambulatory Visit: Payer: Self-pay

## 2018-05-06 MED ORDER — INSULIN PEN NEEDLE 31G X 5 MM MISC: 1.0000 [IU] | Freq: Three times a day (TID) | 0 refills | Status: DC

## 2018-05-07 ENCOUNTER — Other Ambulatory Visit (HOSPITAL_COMMUNITY): Payer: Self-pay

## 2018-05-08 ENCOUNTER — Encounter (HOSPITAL_COMMUNITY)
Admission: RE | Admit: 2018-05-08 | Discharge: 2018-05-08 | Disposition: A | Discharge status: Home / Self Care | Payer: Medicaid Other | Source: Ambulatory Visit | Attending: Nephrology | Admitting: Nephrology

## 2018-05-08 ENCOUNTER — Ambulatory Visit (HOSPITAL_COMMUNITY)
Admission: RE | Admit: 2018-05-08 | Discharge: 2018-05-08 | Disposition: A | Discharge status: Home / Self Care | Payer: Medicaid Other | Source: Ambulatory Visit | Attending: Internal Medicine | Admitting: Internal Medicine

## 2018-05-08 VITALS — BP 138/62 | HR 62 | Temp 98.6°F | Resp 16 | Ht 67.0 in | Wt 210.0 lb

## 2018-05-08 DIAGNOSIS — N184 Chronic kidney disease, stage 4 (severe): Secondary | ICD-10-CM | POA: Insufficient documentation

## 2018-05-08 DIAGNOSIS — D631 Anemia in chronic kidney disease: Secondary | ICD-10-CM | POA: Diagnosis present

## 2018-05-08 DIAGNOSIS — N170 Acute kidney failure with tubular necrosis: Secondary | ICD-10-CM

## 2018-05-08 LAB — IRON AND TIBC
IRON: 69 ug/dL (ref 28–170)
Saturation Ratios: 19 % (ref 10.4–31.8)
TIBC: 372 ug/dL (ref 250–450)
UIBC: 303 ug/dL

## 2018-05-08 LAB — POCT HEMOGLOBIN-HEMACUE: Hemoglobin: 12.2 g/dL (ref 12.0–15.0)

## 2018-05-08 LAB — FERRITIN: FERRITIN: 68 ng/mL (ref 11–307)

## 2018-05-08 MED ORDER — SODIUM CHLORIDE 0.9 % IV SOLN
INTRAVENOUS | Status: DC
  Administered 2018-05-08: 10:00:00 via INTRAVENOUS

## 2018-05-08 MED ORDER — DARBEPOETIN ALFA 150 MCG/0.3ML IJ SOSY: 150.0000 ug | PREFILLED_SYRINGE | INTRAMUSCULAR | Status: DC

## 2018-05-08 MED ORDER — SODIUM CHLORIDE 0.9 % IV SOLN
1000.0000 mg | Freq: Once | INTRAVENOUS | Status: AC
  Administered 2018-05-08: 1000 mg via INTRAVENOUS
  Filled 2018-05-08: qty 100

## 2018-05-08 MED ORDER — DIPHENHYDRAMINE HCL 25 MG PO CAPS
50.0000 mg | ORAL_CAPSULE | Freq: Once | ORAL | Status: AC
  Administered 2018-05-08: 50 mg via ORAL

## 2018-05-08 MED ORDER — ACETAMINOPHEN 325 MG PO TABS
ORAL_TABLET | ORAL | Status: AC
  Administered 2018-05-08: 650 mg via ORAL
  Filled 2018-05-08: qty 2

## 2018-05-08 MED ORDER — ACETAMINOPHEN 325 MG PO TABS
650.0000 mg | ORAL_TABLET | Freq: Once | ORAL | Status: AC
  Administered 2018-05-08: 650 mg via ORAL

## 2018-05-08 MED ORDER — DIPHENHYDRAMINE HCL 25 MG PO CAPS
ORAL_CAPSULE | ORAL | Status: AC
  Administered 2018-05-08: 50 mg via ORAL
  Filled 2018-05-08: qty 2

## 2018-05-22 ENCOUNTER — Encounter (HOSPITAL_COMMUNITY): Payer: Medicaid Other

## 2018-05-26 ENCOUNTER — Encounter: Payer: Self-pay | Admitting: Internal Medicine

## 2018-05-26 ENCOUNTER — Other Ambulatory Visit (INDEPENDENT_AMBULATORY_CARE_PROVIDER_SITE_OTHER): Payer: Medicaid Other

## 2018-05-26 ENCOUNTER — Ambulatory Visit: Payer: Medicaid Other | Admitting: Internal Medicine

## 2018-05-26 ENCOUNTER — Telehealth: Payer: Self-pay | Admitting: Internal Medicine

## 2018-05-26 VITALS — BP 130/74 | HR 61 | Ht 67.0 in | Wt 211.6 lb

## 2018-05-26 DIAGNOSIS — R053 Chronic cough: Secondary | ICD-10-CM

## 2018-05-26 DIAGNOSIS — M318 Other specified necrotizing vasculopathies: Secondary | ICD-10-CM | POA: Diagnosis not present

## 2018-05-26 DIAGNOSIS — R05 Cough: Secondary | ICD-10-CM

## 2018-05-26 DIAGNOSIS — F17200 Nicotine dependence, unspecified, uncomplicated: Secondary | ICD-10-CM | POA: Diagnosis not present

## 2018-05-26 DIAGNOSIS — R059 Cough, unspecified: Secondary | ICD-10-CM

## 2018-05-26 DIAGNOSIS — Z79899 Other long term (current) drug therapy: Secondary | ICD-10-CM

## 2018-05-26 DIAGNOSIS — Z5181 Encounter for therapeutic drug level monitoring: Secondary | ICD-10-CM | POA: Diagnosis not present

## 2018-05-26 LAB — HEPATIC FUNCTION PANEL
ALK PHOS: 77 U/L (ref 39–117)
ALT: 16 U/L (ref 0–35)
AST: 16 U/L (ref 0–37)
Albumin: 4.2 g/dL (ref 3.5–5.2)
BILIRUBIN DIRECT: 0.1 mg/dL (ref 0.0–0.3)
BILIRUBIN TOTAL: 0.2 mg/dL (ref 0.2–1.2)
Total Protein: 7 g/dL (ref 6.0–8.3)

## 2018-05-26 LAB — CBC WITH DIFFERENTIAL/PLATELET
BASOS PCT: 1.3 % (ref 0.0–3.0)
Basophils Absolute: 0.1 10*3/uL (ref 0.0–0.1)
EOS ABS: 0.1 10*3/uL (ref 0.0–0.7)
EOS PCT: 1.4 % (ref 0.0–5.0)
HCT: 33.6 % — ABNORMAL LOW (ref 36.0–46.0)
HEMOGLOBIN: 10.8 g/dL — AB (ref 12.0–15.0)
Lymphocytes Relative: 30.6 % (ref 12.0–46.0)
Lymphs Abs: 2.2 10*3/uL (ref 0.7–4.0)
MCHC: 32.1 g/dL (ref 30.0–36.0)
MCV: 79.6 fl (ref 78.0–100.0)
MONO ABS: 0.6 10*3/uL (ref 0.1–1.0)
Monocytes Relative: 8.9 % (ref 3.0–12.0)
NEUTROS ABS: 4.1 10*3/uL (ref 1.4–7.7)
Neutrophils Relative %: 57.8 % (ref 43.0–77.0)
Platelets: 247 10*3/uL (ref 150.0–400.0)
RBC: 4.22 Mil/uL (ref 3.87–5.11)
RDW: 15.6 % — AB (ref 11.5–15.5)
WBC: 7.1 10*3/uL (ref 4.0–10.5)

## 2018-05-26 LAB — BASIC METABOLIC PANEL
BUN: 67 mg/dL — AB (ref 6–23)
CHLORIDE: 109 meq/L (ref 96–112)
CO2: 22 meq/L (ref 19–32)
CREATININE: 2.98 mg/dL — AB (ref 0.40–1.20)
Calcium: 9.9 mg/dL (ref 8.4–10.5)
GFR: 20.41 mL/min — ABNORMAL LOW (ref >60.00)
GLUCOSE: 121 mg/dL — AB (ref 70–99)
Potassium: 4.1 mEq/L (ref 3.5–5.1)
Sodium: 139 mEq/L (ref 135–145)

## 2018-05-26 NOTE — Patient Instructions (Addendum)
Systemic vasculitis syndrome (HCC) High risk medication use Encounter for therapeutic drug level monitoring   -  Glad you tolerated rituxan for relapse end of August 2019 - we will take an expectant approach with monitoring of the cell flow cytometry and clinical profile in deciding the next dose of Rituxan should be around February 2020 - Do CBC, bmet, liver function test today - Glad you are up-to-date with the flu shot -- in January 2020 return to clinic and we will do a B-cell flow cytometry - due to high resolution CT chest anytime in the next few weeks  - decide on daily pred restart based on CT  Smoking Chronic cough   - your cough is most likely due to smoking and not taking your Trelegy inhaler regularly - Please stop smoking and restart your inhaler and take it regularly -do high resolution CT chest - decide on daily prednisone baed on CT    Follow up - January 2020 ILD clinic

## 2018-05-26 NOTE — Telephone Encounter (Signed)
  Catherine Aguilar  Let opatient know that  - labs stable including anemia which she has but is stable. Exception is kidney a bit dryer than before   - please send results to [facility] of renal   Thanks    SIGNATURE    Dr. Brand Males, M.D., F.C.C.P,  Pulmonary and Critical Care Medicine Staff Physician, [place] Director - Interstitial Lung Disease  Program  Pulmonary [place] at [place], [place], [address]  Pager: [phone], If no answer or between  15:00h - 7:00h: call [phone] Telephone: [phone]  5:44 PM 05/26/2018    LABS    PULMONARY No results for input(s): PHART, PCO2ART, PO2ART, HCO3, TCO2, O2SAT in the last 168 hours.  Invalid input(s): PCO2, PO2  CBC Recent Labs  Lab 05/26/18 1050  HGB 10.8*  HCT 33.6*  WBC 7.1  PLT 247.0    COAGULATION No results for input(s): INR in the last 168 hours.  CARDIAC  No results for input(s): TROPONINI in the last 168 hours. No results for input(s): PROBNP in the last 168 hours.   CHEMISTRY Recent Labs  Lab 05/26/18 1050  NA 139  K 4.1  CL 109  CO2 22  GLUCOSE 121*  BUN 67*  CREATININE 2.98*  CALCIUM 9.9   Estimated Creatinine Clearance: 23 mL/min (A) (by C-G formula based on SCr of 2.98 mg/dL (H)).   LIVER Recent Labs  Lab 05/26/18 1050  AST 16  ALT 16  ALKPHOS 77  BILITOT 0.2  PROT 7.0  ALBUMIN 4.2     INFECTIOUS No results for input(s): LATICACIDVEN, PROCALCITON in the last 168 hours.   ENDOCRINE CBG (last 3)  No results for input(s): GLUCAP in the last 72 hours.       IMAGING x48h  - image(s) personally visualized  -   highlighted in bold No results found.   No results found for this or any previous visit (from the past 240 hour(s)).

## 2018-05-26 NOTE — Progress Notes (Signed)
OV 05/26/2018  Subjective:  Patient ID: Catherine Aguilar, female , DOB: March 29, 1956 , age 62 y.o. , MRN: [id number] , ADDRESS: [place] [id number]   OV 02/12/2017  Chief Complaint  Patient presents with  . Follow-up    Pt states her breathing is doing well. Pt denies any cough, SOB, CP/tightness, and f/c/s.    Follow-up severe mid vasculitis syndrome with life-threatening alveolar hemorrhage and acute renal failure is easter weekend 2018  Currently she is doing well. She has finished 4 doses of Rituxan. Last dose was 01/31/2017. There was significant delay between the first 2 doses which were on schedule in the third and fourth doses delayed. Still on prednisone 60 mg per day. She is accompanied by her daughter Catherine Aguilar. Overall she's doing well. This no hemoptysis anymore. This no shortness of breath. Her G6PD was normal in May 2018. Her Quantiferon Gold was normal. Her most recent chemistries were in May 2018 and a creatinine that improved with 3 mg percent approximately.  OV 05/03/2017  Chief Complaint  Patient presents with  . Follow-up    Pt states her breathing is unchanged since last OV. Pt denies cough, CP/tightness, f/c/s.       62 year old female with systemic vasculitis syndrome. Last dose of Rituxan was in May 2018. She is currently tapered her prednisone down to 10 mg per day. She is on Bactrim prophylaxis. She has visited with nephrology Dr. Lu Aguilar in the last 1 month. According to her history blood tests have improved. She does not want to have repeat blood test today. Last blood test was within a month. She is getting iron infusions at short stay. According to the daughter blood pressure was high and one of the infusions was held off. There is no hemoptysis chest pain or hematuria or any clinical evidence of vasculitis. She is due for flu shot.   OV 09/19/2017  Chief Complaint  Patient presents with  . Follow-up    Pt was seen at the hospital  09/02/17 due to breathing problems and after doing breathing treatments, was sent home. Other than that, pt states that she has been doing good.     62 year old female with systemic vasculitis syndrome.  Last dose of Rituxan May 2018.  She had life-threatening hemoptysis and respiratory failure and renal failure back in early 2018.  Last seen in August 2018.  After that she did not follow-up.  She is here with her daughter.  It appears that Christmas Eve 2018 she decided to smoke cigarettes because of the holidays when she got acute respiratory exacerbation with wheezing and went to the emergency department.  I personally reviewed the chart and the chest x-ray that was clear.  She was discharged with a steroid burst.  Now she is back to baseline prednisone 10 mg/day and her baseline Bactrim for her Wegener's vasculitis.  She tells me that she is getting Rituxan shots once a month at the short stay hospital at [facility] but when I reviewed the chart this is actually [facility].  She is due for a B-cell flow cytometry test today because it has been 9-10 months.  Overall she is feeling fine except fatigue.  She tells me that she does not have any eye issues or neuritis of mononeuritis multiplex or renal issues although most recent creatinine continues to be on 3 mg percent.  There is no hemoptysis or shortness of breath currently.   OV 01/21/2018  Chief Complaint  Patient presents with  . Follow-up    Pt states she has been doing good since last visit but states she has an uncontrollable cough that she has had since March 2018 that will not go away. Pt is not coughing up any mucus but does have postnasal drainage that is clear.    62 year old female with systemic vasculitis syndrome with life-threatening hemoptysis and respiratory and renal failure in early 2018.Marland Kitchen  Last dose of Rituxan May 2018.  Since then on daily prednisone and Bactrim prophylaxis.  Last B-cell flow cytometry January 2019 showing  suppression of B cells.  She presents with her daughter.  She as usual is a poor historian.  As best as I can gather it appears since her last visit she is having recurrent episodes of bronchitis and has been treated with 2 rounds of antibiotics/2 rounds of prednisone burst including a February 2019 visit to the emergency department where she had a chest x-ray that I personally visualized and it looks clear.  She also feels that she is having a chronic cough that is worse than her baseline.  She tells me now that she has had this cough since March 2018 when she suffered from vasculitis syndrome but the daughter says that it is certainly worse in the last few months since the last visit associated with chest tightness.  There is no hemoptysis or edema.  In the last week or so she feels like she has had an acute sinusitis with yellow sputum and she wants a different antibiotic.  There is no fever or hemoptysis or edema orthopnea proximal nocturnal dyspnea or weight loss. FeNO 8ppb and does not suggest asthma    05/26/2018 -   Chief Complaint  Patient presents with  . Follow-up    Pt states she has been doing well since last visit except she states still has the uncontrollable cough that she is unable to get rid of. States she is coughing up clear to light yellow to green mucus and has some occ SOB when exerting herself.    62 year old female with systemic vasculitis syndrome with life-threatening hemoptysis and respiratory and renal failure in early 2018.Marland Kitchen  Last dose of Rituxan May 2018.  Since then on daily prednisone and Bactrim prophylaxis.  Last B-cell flow cytometry January 2019 showing suppression of B cells but in summer 2019 emergence with 3% B cells associated with rising MPO tites in renal clinic summer 2019. Rx for relapose 1058m IV rituxan - May 08, 2018. [facility]   Catherine Aguilar 62y.o. -presents for follow-up. Last seen in May 2019. At that time B-cell flow  cytometry was repeated and showed a 3% B-cell population which was the beginning of emergence following Rituxan a year earlier. At that point in her she was feeling okay but she was still smoking. She also had chronic cough which was controlled by  trelegy inhaler. She subsequently saw Dr. DLorrene Reidin nephrology and it was noticed that her MPO titer was coming back and clinically Dr. DLorrene Reidfelt that she was relapsing. Therefore we arranged for single dose Rituxan thousand milligrams which was administered end of August 2019 without any problems. At this point in time her main issues that she continues to smoke. She has a chronic cough which she says is severe but happens only every few weeks or every several days. She is not taking her inhaler regularly. Review of radiology showed last CT scan of the chestwas early in 2018  and a chest x-ray in February 2019 that was clear. She continues on Bactrim. I do not see prednisone thought at last viit instructed to take pred '5mg'$  per day but she says she "finished it" which was not her instruction     ROS - per HPI     has a past medical history of Allergy, Anemia, Chronic kidney disease, High cholesterol, adenomatous polyp of colon (04/16/2017), Hypertension, Stroke [facility]), and Type 2 diabetes mellitus without complication, with long-term current use of insulin (Sedan) (02/11/2017).   reports that she has been smoking cigarettes. She started smoking about 49 years ago. She has a 9.80 pack-year smoking history. She has never used smokeless tobacco.  Past Surgical History:  Procedure Laterality Date  . ABDOMINAL HYSTERECTOMY  1985    No Known Allergies  Immunization History  Administered Date(s) Administered  . Influenza Split 07/02/2012  . Influenza,inj,Quad PF,6+ Mos 05/03/2017, 05/13/2018  . Pneumococcal Polysaccharide-23 07/02/2012, 11/22/2017    Family History  Problem Relation Age of Onset  . Cancer Father   . Alcohol abuse Father   . Diabetes  Father   . Hypertension Father   . Heart attack Mother   . Diabetes Mother   . Hypertension Mother   . Stroke Mother   . Breast cancer Neg Hx   . Colon cancer Neg Hx   . Esophageal cancer Neg Hx   . Rectal cancer Neg Hx   . Stomach cancer Neg Hx   . Pancreatic cancer Neg Hx      Current Outpatient Medications:  .  ACCU-CHEK AVIVA PLUS test strip, USE TO CHECK BLOOD SUGAR IN THE MORNING AND IN THE EVENING, Disp: 100 each, Rfl: 4 .  albuterol (PROVENTIL HFA;VENTOLIN HFA) 108 (90 Base) MCG/ACT inhaler, INHALE 2 PUFFS EVERY 4-6 HOURS, Disp: 6.7 Inhaler, Rfl: 1 .  albuterol (PROVENTIL) (2.5 MG/3ML) 0.083% nebulizer solution, Take 3 mLs (2.5 mg total) by nebulization every 6 (six) hours as needed for wheezing or shortness of breath., Disp: 150 mL, Rfl: 1 .  aspirin 81 MG EC tablet, Take 1 tablet (81 mg total) by mouth daily. Swallow whole., Disp: 90 tablet, Rfl: 0 .  Blood Glucose Monitoring Suppl (ACCU-CHEK AVIVA PLUS) w/Device KIT, Check blood sugar as instructed, Disp: 1 kit, Rfl: 0 .  Fluticasone-Umeclidin-Vilant (TRELEGY ELLIPTA) 100-62.5-25 MCG/INH AEPB, Inhale 1 puff into the lungs daily., Disp: , Rfl:  .  hydrALAZINE (APRESOLINE) 100 MG tablet, Take 100 mg by mouth 3 (three) times daily., Disp: , Rfl:  .  Insulin Glargine (LANTUS SOLOSTAR) 100 UNIT/ML Solostar Pen, Inject 5 Units into the skin daily at 10 pm. (Patient taking differently: Inject 5 Units into the skin every evening. ), Disp: 5 pen, Rfl: PRN .  Insulin Pen Needle (B-D UF III MINI PEN NEEDLES) 31G X 5 MM MISC, 1 Units by Does not apply route 3 (three) times daily., Disp: 100 each, Rfl: 0 .  labetalol (NORMODYNE) 300 MG tablet, Take 300 mg by mouth 2 (two) times daily., Disp: , Rfl:  .  MICROLET LANCETS MISC, Use as directed to test three times a day. ICD-10 code: E11.9.  Please use Counselling psychologist., Disp: 100 each, Rfl: 3 .  pravastatin (PRAVACHOL) 40 MG tablet, TAKE 1 TABLET BY MOUTH AT BEDTIME FOR  CHOLESTEROL, Disp: 90 tablet, Rfl: 3 .  sulfamethoxazole-trimethoprim (BACTRIM DS,SEPTRA DS) 800-160 MG tablet, TAKE 1 TABLET BY MOUTH 3 (THREE) TIMES A WEEK., Disp: 12 tablet, Rfl: 5 .  triamcinolone (KENALOG) 0.025 %  ointment, Apply 1 application topically 2 (two) times daily., Disp: 30 g, Rfl: 1      Objective:   Vitals:   05/26/18 1002  BP: 130/74  Pulse: 61  SpO2: 96%  Weight: 211 lb 9.6 oz (96 kg)  Height: '5\' 7"'$  (1.702 m)    Estimated body mass index is 33.14 kg/m as calculated from the following:   Height as of this encounter: '5\' 7"'$  (1.702 m).   Weight as of this encounter: 211 lb 9.6 oz (96 kg).  '@WEIGHTCHANGE'$ @  [employer]   05/26/18 1002  Weight: 211 lb 9.6 oz (96 kg)     Physical Exam  General Appearance:    Alert, cooperative, no distress, appears stated age - yews , sitting on - chair  Head:    Normocephalic, without obvious abnormality, atraumatic  Eyes:    PERRL, conjunctiva/corneas clear,  Ears:    Normal TM's and external ear canals, both ears  Nose:   Nares normal, septum midline, mucosa normal, no drainage    or sinus tenderness. OXYGEN ON  - no . Patient is @ ra   Throat:   Lips, mucosa, and tongue normal; teeth and gums normal. Cyanosis on lips - no  Neck:   Supple, symmetrical, trachea midline, no adenopathy;    thyroid:  no enlargement/tenderness/nodules; no carotid   bruit or JVD  Back:     Symmetric, no curvature, ROM normal, no CVA tenderness  Lungs:     Distress - no , Wheeze no, Barrell Chest - no, Purse lip breathing - no, Crackles - no   Chest Wall:    No tenderness or deformity. Scars in chest no   Heart:    Regular rate and rhythm, S1 and S2 normal, no murmur, rub   or gallop  Breast Exam:    NOT DONE  Abdomen:     Soft, non-tender, bowel sounds active all four quadrants,    no masses, no organomegaly  Genitalia:   NOT DONE  Rectal:   NOT DONE  Extremities:   Extremities normal, atraumatic, Clubbing - no, Edema - no  Pulses:   2+  and symmetric all extremities  Skin:   Stigmata of Connective Tissue Disease - no  Lymph nodes:   Cervical, supraclavicular, and axillary nodes normal  Psychiatric:  Neurologic:   Poor historian CNII-XII intact, normal strength, sensation  throughout           Assessment:       ICD-10-CM   1. Systemic vasculitis syndrome ([place]) M31.8   2. High risk medication use Z79.899   3. Encounter for therapeutic drug level monitoring Z51.81   4. Smoking F17.200   5. Chronic cough R05        Plan:    Systemic vasculitis syndrome ([place]) High risk medication use Encounter for therapeutic drug level monitoring   -  Glad you tolerated rituxan for relapse end of August 2019 - we will take an expectant approach with monitoring of the cell flow cytometry and clinical profile in deciding the next dose of Rituxan should be around February 2020 - Do CBC, bmet, liver function test today - Glad you are up-to-date with the flu shot -- in January 2020 return to clinic and we will do a B-cell flow cytometry - due to high resolution CT chest anytime in the next few weeks  - decide on daily pred restart based on CT  Smoking Chronic cough   - your cough is most likely due  to smoking and not taking your Trelegy inhaler regularly - Please stop smoking and restart your inhaler and take it regularly -do high resolution CT chest - decide on daily prednisone baed on CT    Follow up - January 2020 ILD clinic   SIGNATURE    Dr. Brand Males, M.D., F.C.C.P,  Pulmonary and Critical Care Medicine Staff Physician, [place] Director - Interstitial Lung Disease  Program  Pulmonary [place] at [place], [place], [address]  Pager: [phone], If no answer or between  15:00h - 7:00h: call [phone] Telephone: [phone]  10:32 AM 05/26/2018

## 2018-05-28 NOTE — Telephone Encounter (Signed)
Spoke to pt's daughter, Tanna Savoy and requested that pt return our call.  Unable to relay results to [place], as pt does not have a DPR on file.

## 2018-05-29 ENCOUNTER — Telehealth: Payer: Self-pay | Admitting: Internal Medicine

## 2018-05-29 NOTE — Telephone Encounter (Signed)
Spoke with patient, need to reschedule CT, [place] CT number given to patient, [phone] to reschedule. Voiced understanding. Nothing further needed at this time.

## 2018-05-30 NOTE — Telephone Encounter (Addendum)
Advised pt of results. Pt understood and nothing further is needed.   I faxed results to Dr. Lorrene Reid.

## 2018-06-05 ENCOUNTER — Encounter (HOSPITAL_COMMUNITY): Payer: Medicaid Other

## 2018-06-05 ENCOUNTER — Ambulatory Visit (HOSPITAL_COMMUNITY)
Admission: RE | Admit: 2018-06-05 | Discharge: 2018-06-05 | Disposition: A | Discharge status: Home / Self Care | Payer: Medicaid Other | Source: Ambulatory Visit | Attending: Nephrology | Admitting: Nephrology

## 2018-06-05 VITALS — BP 159/100 | HR 90 | Temp 98.1°F | Resp 20

## 2018-06-05 DIAGNOSIS — D631 Anemia in chronic kidney disease: Secondary | ICD-10-CM | POA: Diagnosis present

## 2018-06-05 DIAGNOSIS — N170 Acute kidney failure with tubular necrosis: Secondary | ICD-10-CM

## 2018-06-05 DIAGNOSIS — N184 Chronic kidney disease, stage 4 (severe): Secondary | ICD-10-CM | POA: Insufficient documentation

## 2018-06-05 LAB — IRON AND TIBC
IRON: 60 ug/dL (ref 28–170)
Saturation Ratios: 16 % (ref 10.4–31.8)
TIBC: 367 ug/dL (ref 250–450)
UIBC: 307 ug/dL

## 2018-06-05 LAB — FERRITIN: Ferritin: 81 ng/mL (ref 11–307)

## 2018-06-05 MED ORDER — DARBEPOETIN ALFA 150 MCG/0.3ML IJ SOSY
150.0000 ug | PREFILLED_SYRINGE | INTRAMUSCULAR | Status: DC
  Administered 2018-06-05: 150 ug via SUBCUTANEOUS

## 2018-06-05 MED ORDER — DARBEPOETIN ALFA 150 MCG/0.3ML IJ SOSY
PREFILLED_SYRINGE | INTRAMUSCULAR | Status: AC
  Filled 2018-06-05: qty 0.3

## 2018-06-06 ENCOUNTER — Inpatient Hospital Stay: Admission: RE | Admit: 2018-06-06 | Payer: Medicaid Other | Source: Ambulatory Visit

## 2018-06-06 LAB — POCT HEMOGLOBIN-HEMACUE: HEMOGLOBIN: 11.1 g/dL — AB (ref 12.0–15.0)

## 2018-06-13 ENCOUNTER — Other Ambulatory Visit: Payer: Self-pay | Admitting: Family Medicine

## 2018-06-13 ENCOUNTER — Inpatient Hospital Stay: Admission: RE | Admit: 2018-06-13 | Payer: Medicaid Other | Source: Ambulatory Visit

## 2018-06-13 DIAGNOSIS — Z1231 Encounter for screening mammogram for malignant neoplasm of breast: Secondary | ICD-10-CM

## 2018-07-02 ENCOUNTER — Other Ambulatory Visit: Payer: Self-pay | Admitting: Family Medicine

## 2018-07-03 ENCOUNTER — Ambulatory Visit (HOSPITAL_COMMUNITY)
Admission: RE | Admit: 2018-07-03 | Discharge: 2018-07-03 | Disposition: A | Discharge status: Home / Self Care | Payer: Medicaid Other | Source: Ambulatory Visit | Attending: Nephrology | Admitting: Nephrology

## 2018-07-03 VITALS — BP 158/81 | HR 61 | Temp 98.4°F | Resp 20

## 2018-07-03 DIAGNOSIS — D631 Anemia in chronic kidney disease: Secondary | ICD-10-CM | POA: Diagnosis present

## 2018-07-03 DIAGNOSIS — N170 Acute kidney failure with tubular necrosis: Secondary | ICD-10-CM

## 2018-07-03 DIAGNOSIS — N184 Chronic kidney disease, stage 4 (severe): Secondary | ICD-10-CM | POA: Insufficient documentation

## 2018-07-03 LAB — IRON AND TIBC
Iron: 53 ug/dL (ref 28–170)
Saturation Ratios: 14 % (ref 10.4–31.8)
TIBC: 368 ug/dL (ref 250–450)
UIBC: 315 ug/dL

## 2018-07-03 LAB — FERRITIN: FERRITIN: 69 ng/mL (ref 11–307)

## 2018-07-03 LAB — POCT HEMOGLOBIN-HEMACUE: Hemoglobin: 11.2 g/dL — ABNORMAL LOW (ref 12.0–15.0)

## 2018-07-03 MED ORDER — DARBEPOETIN ALFA 150 MCG/0.3ML IJ SOSY
PREFILLED_SYRINGE | INTRAMUSCULAR | Status: AC
  Administered 2018-07-03: 150 ug
  Filled 2018-07-03: qty 0.3

## 2018-07-03 MED ORDER — DARBEPOETIN ALFA 150 MCG/0.3ML IJ SOSY: 150.0000 ug | PREFILLED_SYRINGE | INTRAMUSCULAR | Status: DC

## 2018-07-18 ENCOUNTER — Ambulatory Visit
Admission: RE | Admit: 2018-07-18 | Discharge: 2018-07-18 | Disposition: A | Discharge status: Home / Self Care | Payer: Medicaid Other | Source: Ambulatory Visit | Attending: Family Medicine | Admitting: Family Medicine

## 2018-07-18 DIAGNOSIS — Z1231 Encounter for screening mammogram for malignant neoplasm of breast: Secondary | ICD-10-CM

## 2018-07-31 ENCOUNTER — Ambulatory Visit (HOSPITAL_COMMUNITY)
Admission: RE | Admit: 2018-07-31 | Discharge: 2018-07-31 | Disposition: A | Discharge status: Home / Self Care | Payer: Medicaid Other | Source: Ambulatory Visit | Attending: Nephrology | Admitting: Nephrology

## 2018-07-31 VITALS — BP 169/82 | HR 63 | Temp 97.7°F | Resp 20

## 2018-07-31 DIAGNOSIS — N170 Acute kidney failure with tubular necrosis: Secondary | ICD-10-CM

## 2018-07-31 LAB — IRON AND TIBC
IRON: 65 ug/dL (ref 28–170)
Saturation Ratios: 19 % (ref 10.4–31.8)
TIBC: 343 ug/dL (ref 250–450)
UIBC: 278 ug/dL

## 2018-07-31 LAB — FERRITIN: Ferritin: 65 ng/mL (ref 11–307)

## 2018-07-31 MED ORDER — DARBEPOETIN ALFA 150 MCG/0.3ML IJ SOSY
150.0000 ug | PREFILLED_SYRINGE | INTRAMUSCULAR | Status: DC
  Administered 2018-07-31: 150 ug via SUBCUTANEOUS

## 2018-07-31 MED ORDER — DARBEPOETIN ALFA 150 MCG/0.3ML IJ SOSY
PREFILLED_SYRINGE | INTRAMUSCULAR | Status: AC
  Filled 2018-07-31: qty 0.3

## 2018-08-01 ENCOUNTER — Ambulatory Visit (INDEPENDENT_AMBULATORY_CARE_PROVIDER_SITE_OTHER)
Admission: RE | Admit: 2018-08-01 | Discharge: 2018-08-01 | Disposition: A | Discharge status: Home / Self Care | Payer: Medicaid Other | Source: Ambulatory Visit | Attending: Internal Medicine | Admitting: Internal Medicine

## 2018-08-01 DIAGNOSIS — R05 Cough: Secondary | ICD-10-CM | POA: Diagnosis not present

## 2018-08-01 DIAGNOSIS — R059 Cough, unspecified: Secondary | ICD-10-CM

## 2018-08-01 LAB — POCT HEMOGLOBIN-HEMACUE: Hemoglobin: 11.7 g/dL — ABNORMAL LOW (ref 12.0–15.0)

## 2018-08-06 ENCOUNTER — Other Ambulatory Visit: Payer: Self-pay | Admitting: Family Medicine

## 2018-08-15 ENCOUNTER — Other Ambulatory Visit: Payer: Self-pay | Admitting: *Deleted

## 2018-08-15 MED ORDER — SULFAMETHOXAZOLE-TRIMETHOPRIM 800-160 MG PO TABS: 1.0000 | ORAL_TABLET | ORAL | 5 refills | Status: DC

## 2018-08-28 ENCOUNTER — Ambulatory Visit (HOSPITAL_COMMUNITY)
Admission: RE | Admit: 2018-08-28 | Discharge: 2018-08-28 | Disposition: A | Discharge status: Home / Self Care | Payer: Medicaid Other | Source: Ambulatory Visit | Attending: Nephrology | Admitting: Nephrology

## 2018-08-28 VITALS — BP 161/83 | HR 62 | Temp 98.3°F | Resp 20

## 2018-08-28 DIAGNOSIS — N170 Acute kidney failure with tubular necrosis: Secondary | ICD-10-CM | POA: Diagnosis present

## 2018-08-28 LAB — POCT HEMOGLOBIN-HEMACUE: HEMOGLOBIN: 12.6 g/dL (ref 12.0–15.0)

## 2018-08-28 LAB — IRON AND TIBC
Iron: 59 ug/dL (ref 28–170)
Saturation Ratios: 16 % (ref 10.4–31.8)
TIBC: 370 ug/dL (ref 250–450)
UIBC: 311 ug/dL

## 2018-08-28 LAB — FERRITIN: Ferritin: 68 ng/mL (ref 11–307)

## 2018-08-28 MED ORDER — DARBEPOETIN ALFA 150 MCG/0.3ML IJ SOSY: 150.0000 ug | PREFILLED_SYRINGE | INTRAMUSCULAR | Status: DC

## 2018-09-08 ENCOUNTER — Other Ambulatory Visit: Payer: Self-pay

## 2018-09-12 ENCOUNTER — Encounter (HOSPITAL_COMMUNITY): Payer: Medicaid Other

## 2018-09-12 MED ORDER — INSULIN GLARGINE 100 UNIT/ML SOLOSTAR PEN: 5.0000 [IU] | PEN_INJECTOR | Freq: Every day | SUBCUTANEOUS | 99 refills | Status: DC

## 2018-09-18 ENCOUNTER — Ambulatory Visit (HOSPITAL_COMMUNITY)
Admission: RE | Admit: 2018-09-18 | Discharge: 2018-09-18 | Disposition: A | Discharge status: Home / Self Care | Payer: Medicaid Other | Source: Ambulatory Visit | Attending: Nephrology | Admitting: Nephrology

## 2018-09-18 VITALS — BP 162/82 | HR 65 | Temp 98.9°F | Resp 20

## 2018-09-18 DIAGNOSIS — N170 Acute kidney failure with tubular necrosis: Secondary | ICD-10-CM | POA: Insufficient documentation

## 2018-09-18 LAB — POCT HEMOGLOBIN-HEMACUE: HEMOGLOBIN: 11.9 g/dL — AB (ref 12.0–15.0)

## 2018-09-18 MED ORDER — DARBEPOETIN ALFA 150 MCG/0.3ML IJ SOSY
PREFILLED_SYRINGE | INTRAMUSCULAR | Status: AC
  Administered 2018-09-18: 150 ug
  Filled 2018-09-18: qty 0.3

## 2018-09-18 MED ORDER — DARBEPOETIN ALFA 150 MCG/0.3ML IJ SOSY: 150.0000 ug | PREFILLED_SYRINGE | INTRAMUSCULAR | Status: DC

## 2018-09-23 ENCOUNTER — Ambulatory Visit: Payer: Medicaid Other | Admitting: Internal Medicine

## 2018-09-23 ENCOUNTER — Encounter: Payer: Self-pay | Admitting: Internal Medicine

## 2018-09-23 ENCOUNTER — Telehealth: Payer: Self-pay | Admitting: Internal Medicine

## 2018-09-23 VITALS — BP 132/82 | HR 61 | Ht 67.0 in | Wt 215.8 lb

## 2018-09-23 DIAGNOSIS — M318 Other specified necrotizing vasculopathies: Secondary | ICD-10-CM

## 2018-09-23 DIAGNOSIS — F17200 Nicotine dependence, unspecified, uncomplicated: Secondary | ICD-10-CM | POA: Diagnosis not present

## 2018-09-23 DIAGNOSIS — I2510 Atherosclerotic heart disease of native coronary artery without angina pectoris: Secondary | ICD-10-CM

## 2018-09-23 DIAGNOSIS — Z79899 Other long term (current) drug therapy: Secondary | ICD-10-CM

## 2018-09-23 DIAGNOSIS — Z5181 Encounter for therapeutic drug level monitoring: Secondary | ICD-10-CM | POA: Diagnosis not present

## 2018-09-23 DIAGNOSIS — J439 Emphysema, unspecified: Secondary | ICD-10-CM

## 2018-09-23 MED ORDER — TIOTROPIUM BROMIDE MONOHYDRATE 1.25 MCG/ACT IN AERS: 2.0000 | INHALATION_SPRAY | Freq: Every day | RESPIRATORY_TRACT | 11 refills | Status: DC

## 2018-09-23 NOTE — Addendum Note (Signed)
Addended by: Lorretta Harp on: 09/23/2018 02:56 PM   Modules accepted: Orders

## 2018-09-23 NOTE — Telephone Encounter (Signed)
Pt was seen at office for f/u visit. At end of visit, MR stated that pt needed to repeat the b cell flow cytometry. I stated to pt that she will have to go to lab at [facility] to get labwork done.  Pt stated to me that she takes transportation and she stated she would need an appt. I stated to pt that she would need to call to see if she could schedule an appt. Pt went to lab at [facility] office and see if she needed to schedule an appt but pt stated to me that the transportation people were the ones that would need the appt and for me to call the lab to schedule an appt for her.  Called [facility] and spoke with Vernelle Emerald to see if pt needed to schedule an appt for the labwork and per [facility], no appt is needed to be scheduled as it is on a walk in basis as long as they had the order form.  Attempted to call pt to tell her this but unable to reach her. Left a detailed message for pt stating the info that an appt is not needed as it is by walk in basis and also stated to pt to return my call once she received the message so I knew that she received it.

## 2018-09-23 NOTE — Progress Notes (Signed)
OV 05/26/2018  Subjective:  Patient ID: Catherine Aguilar, female , DOB: [birth year] , age 63 y.o. , MRN: [id number] , ADDRESS: [place] [address]   OV 02/12/2017  Chief Complaint  Patient presents with  . Follow-up    Pt states her breathing is doing well. Pt denies any cough, SOB, CP/tightness, and f/c/s.    Follow-up severe mid vasculitis syndrome with life-threatening alveolar hemorrhage and acute renal failure is easter weekend 2018  Currently she is doing well. She has finished 4 doses of Rituxan. Last dose was 01/31/2017. There was significant delay between the first 2 doses which were on schedule in the third and fourth doses delayed. Still on prednisone 60 mg per day. She is accompanied by her daughter [name]. Overall she's doing well. This no hemoptysis anymore. This no shortness of breath. Her G6PD was normal in May 2018. Her Quantiferon Gold was normal. Her most recent chemistries were in May 2018 and a creatinine that improved with 3 mg percent approximately.  OV 05/03/2017  Chief Complaint  Patient presents with  . Follow-up    Pt states her breathing is unchanged since last OV. Pt denies cough, CP/tightness, f/c/s.       63 year old female with systemic vasculitis syndrome. Last dose of Rituxan was in May 2018. She is currently tapered her prednisone down to 10 mg per day. She is on Bactrim prophylaxis. She has visited with nephrology Dr. Lu Duffel in the last 1 month. According to her history blood tests have improved. She does not want to have repeat blood test today. Last blood test was within a month. She is getting iron infusions at short stay. According to the daughter blood pressure was high and one of the infusions was held off. There is no hemoptysis chest pain or hematuria or any clinical evidence of vasculitis. She is due for flu shot.   OV 09/19/2017  Chief Complaint  Patient presents with  . Follow-up    Pt was seen at the hospital  09/02/17 due to breathing problems and after doing breathing treatments, was sent home. Other than that, pt states that she has been doing good.     63 year old female with systemic vasculitis syndrome.  Last dose of Rituxan May 2018.  She had life-threatening hemoptysis and respiratory failure and renal failure back in early 2018.  Last seen in August 2018.  After that she did not follow-up.  She is here with her daughter.  It appears that Christmas Eve 2018 she decided to smoke cigarettes because of the holidays when she got acute respiratory exacerbation with wheezing and went to the emergency department.  I personally reviewed the chart and the chest x-ray that was clear.  She was discharged with a steroid burst.  Now she is back to baseline prednisone 10 mg/day and her baseline Bactrim for her Wegener's vasculitis.  She tells me that she is getting Rituxan shots once a month at the short stay hospital at [facility] but when I reviewed the chart this is actually [facility].  She is due for a B-cell flow cytometry test today because it has been 9-10 months.  Overall she is feeling fine except fatigue.  She tells me that she does not have any eye issues or neuritis of mononeuritis multiplex or renal issues although most recent creatinine continues to be on 3 mg percent.  There is no hemoptysis or shortness of breath currently.   OV 01/21/2018  Chief Complaint  Patient presents with  . Follow-up    Pt states she has been doing good since last visit but states she has an uncontrollable cough that she has had since March 2018 that will not Aguilar away. Pt is not coughing up any mucus but does have postnasal drainage that is clear.    63 year old female with systemic vasculitis syndrome with life-threatening hemoptysis and respiratory and renal failure in early 2018.Marland Kitchen  Last dose of Rituxan May 2018.  Since then on daily prednisone and Bactrim prophylaxis.  Last B-cell flow cytometry January 2019 showing  suppression of B cells.  She presents with her daughter.  She as usual is a poor historian.  As best as I can gather it appears since her last visit she is having recurrent episodes of bronchitis and has been treated with 2 rounds of antibiotics/2 rounds of prednisone burst including a February 2019 visit to the emergency department where she had a chest x-ray that I personally visualized and it looks clear.  She also feels that she is having a chronic cough that is worse than her baseline.  She tells me now that she has had this cough since March 2018 when she suffered from vasculitis syndrome but the daughter says that it is certainly worse in the last few months since the last visit associated with chest tightness.  There is no hemoptysis or edema.  In the last week or so she feels like she has had an acute sinusitis with yellow sputum and she wants a different antibiotic.  There is no fever or hemoptysis or edema orthopnea proximal nocturnal dyspnea or weight loss. FeNO 8ppb and does not suggest asthma    05/26/2018 -   Chief Complaint  Patient presents with  . Follow-up    Pt states she has been doing well since last visit except she states still has the uncontrollable cough that she is unable to get rid of. States she is coughing up clear to light yellow to green mucus and has some occ SOB when exerting herself.    63 year old female with systemic vasculitis syndrome with life-threatening hemoptysis and respiratory and renal failure in early 2018.Marland Kitchen  Last dose of Rituxan May 2018.  Since then on daily prednisone and Bactrim prophylaxis.  Last B-cell flow cytometry January 2019 showing suppression of B cells but in summer 2019 emergence with 3% B cells associated with rising MPO tites in renal clinic summer 2019. Rx for relapose 1058m IV rituxan - May 08, 2018. [facility]   Catherine Aguilar 63y.o. -presents for follow-up. Last seen in May 2019. At that time B-cell flow  cytometry was repeated and showed a 3% B-cell population which was the beginning of emergence following Rituxan a year earlier. At that point in her she was feeling okay but she was still smoking. She also had chronic cough which was controlled by  trelegy inhaler. She subsequently saw Dr. DLorrene Reidin nephrology and it was noticed that her MPO titer was coming back and clinically Dr. DLorrene Reidfelt that she was relapsing. Therefore we arranged for single dose Rituxan thousand milligrams which was administered end of August 2019 without any problems. At this point in time her main issues that she continues to smoke. She has a chronic cough which she says is severe but happens only every few weeks or every several days. She is not taking her inhaler regularly. Review of radiology showed last CT scan of the chestwas early in 2018  and a chest x-ray in February 2019 that was clear. She continues on Bactrim. I do not see prednisone thought at last viit instructed to take pred 62m per day but she says she "finished it" which was not her instruction    OV 09/23/2018  Subjective:  Patient ID: SGwenith Aguilar female , DOB: 709/04/1956, age 89y.o. , MRN: [id number], [place] [id number]  63year old female with systemic vasculitis syndrome with life-threatening hemoptysis and respiratory and renal failure in early 2018..Marland Kitchen Last dose of Rituxan May 2018.  Since then on daily prednisone and Bactrim prophylaxis.  Last B-cell flow cytometry January 2019 showing suppression of B cells but in summer 2019 emergence with 3% B cells associated with rising MPO tites in renal clinic summer 2019. Rx for relapose (based on b cells and creat rise) 1006mIV rituxan - May 08, 2018. [facility] 2020 but she is on bactrim for prophylaxis   09/23/2018 -   Chief Complaint  Patient presents with  . Follow-up    Pt states she has been doing well since last visit and denies any  complaints.     HPI Catherine Aguilar [age].o. -presents for follow-up of the above issue.  She presents by herself.  As usual she is a poor historian.  This is evidenced by the fact that she does not know that she suffers from vasculitis.  She has no idea what this time even means.  She has no idea that she received Rituxan although she recollects receiving infusion.  Overall she tells me that she is well except for mild chronic cough.  She continues to smoke.  Her last Rituxan infusion was in August 2019.  Then in November 2019 she had a CT scan of the chest that shows new findings of coronary artery calcification.  In addition she is noted to have emphysema.  No obvious evidence of any Wegener's vasculitis.  She is up-to-date with her respiratory vaccines.  She continues on Bactrim prophylaxis but is not on any inhaler therapy other than pro-air.  She is not sure about quitting smoking.     ROS - per HPI     has a past medical history of Allergy, Anemia, Chronic kidney disease, High cholesterol, adenomatous polyp of colon (04/16/2017), [facility]pertension, [facility] and Type 2 diabetes mellitus without complication, with long-term current use of insulin ([facility] [place](02/11/2017).   reports that she has been smoking cigarettes. She started smoking about 50 years ago. She has a 9.80 pack-year smoking history. She has never used smokeless tobacco.  Past Surgical History:  Procedure Laterality Date  . ABDOMINAL HYSTERECTOMY  1985    No Known Allergies  Immunization History  Administered Date(s) Administered  . Influenza Split 07/02/2012  . Influenza,inj,Quad PF,6+ Mos 05/03/2017, 05/13/2018  . Pneumococcal Polysaccharide-23 07/02/2012, 11/22/2017    Family History  Problem Relation Age of Onset  . Cancer Father   . Alcohol abuse Father   . Diabetes Father   . Hypertension Father   . Heart attack Mother   . Diabetes Mother   . Hypertension Mother   . Stroke Mother   . Breast cancer Neg Hx     . Colon cancer Neg Hx   . Esophageal cancer Neg Hx   . Rectal cancer Neg Hx   . Stomach cancer Neg Hx   . Pancreatic cancer Neg Hx      Current Outpatient Medications:  .  ACCU-CHEK AVIVA PLUS test strip, USE TO CHECK BLOOD SUGAR IN THE MORNING AND IN THE EVENING, Disp: 100 each, Rfl: 4 .  albuterol (PROVENTIL HFA;VENTOLIN HFA) 108 (90 Base) MCG/ACT inhaler, INHALE 2 PUFFS EVERY 4-6 HOURS, Disp: 6.7 Inhaler, Rfl: 1 .  albuterol (PROVENTIL) (2.5 MG/3ML) 0.083% nebulizer solution, Take 3 mLs (2.5 mg total) by nebulization every 6 (six) hours as needed for wheezing or shortness of breath., Disp: 150 mL, Rfl: 1 .  aspirin 81 MG EC tablet, TAKE 1 TABLET (81 MG TOTAL) BY MOUTH DAILY. SWALLOW WHOLE., Disp: 90 tablet, Rfl: 3 .  B-D UF III MINI PEN NEEDLES 31G X 5 MM MISC, 1 UNITS BY DOES NOT APPLY ROUTE 3 (THREE) TIMES DAILY., Disp: 100 each, Rfl: 2 .  Blood Glucose Monitoring Suppl (ACCU-CHEK AVIVA PLUS) w/Device KIT, Check blood sugar as instructed, Disp: 1 kit, Rfl: 0 .  hydrALAZINE (APRESOLINE) 100 MG tablet, Take 100 mg by mouth 3 (three) times daily., Disp: , Rfl:  .  Insulin Glargine (LANTUS SOLOSTAR) 100 UNIT/ML Solostar Pen, Inject 5 Units into the skin daily at 10 pm., Disp: 5 pen, Rfl: PRN .  labetalol (NORMODYNE) 300 MG tablet, Take 300 mg by mouth 2 (two) times daily., Disp: , Rfl:  .  MICROLET LANCETS MISC, Use as directed to test three times a day. ICD-10 code: E11.9.  Please use Counselling psychologist., Disp: 100 each, Rfl: 3 .  pravastatin (PRAVACHOL) 40 MG tablet, TAKE 1 TABLET BY MOUTH AT BEDTIME FOR CHOLESTEROL, Disp: 90 tablet, Rfl: 3 .  sulfamethoxazole-trimethoprim (BACTRIM DS,SEPTRA DS) 800-160 MG tablet, Take 1 tablet by mouth 3 (three) times a week., Disp: 12 tablet, Rfl: 5 .  triamcinolone (KENALOG) 0.025 % ointment, Apply 1 application topically 2 (two) times daily., Disp: 30 g, Rfl: 1 .  Fluticasone-Umeclidin-Vilant (TRELEGY ELLIPTA) 100-62.5-25 MCG/INH AEPB,  Inhale 1 puff into the lungs daily., Disp: , Rfl:       Objective:   Vitals:   09/23/18 1354  BP: 132/82  Pulse: 61  SpO2: 97%  Weight: 215 lb 12.8 oz (97.9 kg)  Height: _0  (1.702 m)    Estimated body mass index is 33.8 kg/m as calculated from the following:   Height as of this encounter: _1  (1.702 m).   Weight as of this encounter: 215 lb 12.8 oz (97.9 kg).  _2 @  [employer]   09/23/18 1354  Weight: 215 lb 12.8 oz (97.9 kg)     Physical Exam  General Appearance:    Alert, cooperative, no distress, appears stated age - yes , Deconditioned looking - no , OBESE  - yes, Sitting on Wheelchair -  no  Head:    Normocephalic, without obvious abnormality, atraumatic  Eyes:    PERRL, conjunctiva/corneas clear,  Ears:    Normal TM's and external ear canals, both ears  Nose:   Nares normal, septum midline, mucosa normal, no drainage    or sinus tenderness. OXYGEN ON  - no . Patient is @ ra   Throat:   Lips, mucosa, and tongue normal; teeth and gums normal. Cyanosis on lips - no  Neck:   Supple, symmetrical, trachea midline, no adenopathy;    thyroid:  no enlargement/tenderness/nodules; no carotid   bruit or JVD  Back:     Symmetric, no curvature, ROM normal, no CVA tenderness  Lungs:     Distress - no , Wheeze no, Barrell Chest - no, Purse lip breathing - no, Crackles - no  Chest Wall:    No tenderness or deformity.    Heart:    Regular rate and rhythm, S1 and S2 normal, no rub   or gallop, Murmur - no  Breast Exam:    NOT DONE  Abdomen:     Soft, non-tender, bowel sounds active all four quadrants,    no masses, no organomegaly. Visceral obesity - yes  Genitalia:   NOT DONE  Rectal:   NOT DONE  Extremities:   Extremities - normal, Has Cane - no, Clubbing - no, Edema - no  Pulses:   2+ and symmetric all extremities  Skin:   Stigmata of Connective Tissue Disease - no  Lymph nodes:   Cervical, supraclavicular, and axillary nodes normal  Psychiatric:    Neurologic:   Pleasant - yes, Anxious - no, Flat affect - no. POOR HISTORIAN  CAm-ICU - neg, Alert and Oriented x 3 - yes, Moves all 4s - yes, Speech - normal, Cognition - intact           Assessment:       ICD-10-CM   1. Systemic vasculitis syndrome ([place]) M31.8   2. High risk medication use Z79.899   3. Encounter for therapeutic drug level monitoring Z51.81   4. Smoking F17.200   5. Pulmonary emphysema, unspecified emphysema type ([place]) J43.9   6. Coronary artery calcification seen on CAT scan I25.10        Plan:     Patient Instructions  Systemic vasculitis syndrome (HCC) High risk medication use Encounter for therapeutic drug level monitoring  - clinically vasculitis in remission - check cbc, bmet, 09/23/2018 - check b cell flow cytometry (for rituxan use) blood work at [facility]  - Dr Gari Crown or Dr Thressa Sheller attention  Smoking - please quit asap  Pulmonary emphysema, unspecified emphysema type ([place]) - cough could be related to this  - Please start spiriva 1 puff daily - take sample, script and show technique   Coronary artery calcification seen on CAT scan - refer cardiology  Followup 6 months or sooner if needed     SIGNATURE    Dr. Brand Males, M.D., F.C.C.P,  Pulmonary and Critical Care Medicine Staff Physician, [place] Director - Interstitial Lung Disease  Program  Pulmonary [place] at [place], [place], [address]  Pager: [phone], If no answer or between  15:00h - 7:00h: call [phone] Telephone: [phone]  2:28 PM 09/23/2018

## 2018-09-23 NOTE — Patient Instructions (Addendum)
Systemic vasculitis syndrome (HCC) High risk medication use Encounter for therapeutic drug level monitoring  - clinically vasculitis in remission - check cbc, bmet, 09/23/2018 - check b cell flow cytometry (for rituxan use) blood work at [facility]  - Dr Gari Crown or Dr Thressa Sheller attention  Smoking - please quit asap  Pulmonary emphysema, unspecified emphysema type ([place]) - cough could be related to this  - Please start spiriva 1 puff daily - take sample, script and show technique   Coronary artery calcification seen on CAT scan - refer cardiology  Followup 6 months or sooner if needed

## 2018-09-25 ENCOUNTER — Encounter (HOSPITAL_COMMUNITY): Payer: Medicaid Other

## 2018-09-26 ENCOUNTER — Other Ambulatory Visit (HOSPITAL_COMMUNITY)
Admission: RE | Admit: 2018-09-26 | Discharge: 2018-09-26 | Disposition: A | Discharge status: Home / Self Care | Payer: Medicaid Other | Source: Ambulatory Visit | Attending: Internal Medicine | Admitting: Internal Medicine

## 2018-09-26 ENCOUNTER — Telehealth: Payer: Self-pay | Admitting: Internal Medicine

## 2018-09-26 DIAGNOSIS — M318 Other specified necrotizing vasculopathies: Secondary | ICD-10-CM | POA: Insufficient documentation

## 2018-09-26 NOTE — Telephone Encounter (Signed)
Pt was told to go to [facility] to have the B Cell repeated at last OV. Pt stated she will have to arrange transportation which she said it might be next week before she could be able to go there to have labs performed.

## 2018-09-26 NOTE — Telephone Encounter (Signed)
Flow cytometry shows < 1% B cells.   Plan  - go to [facility] in 3 months and do repeat b cell flow cytometry  - keep fu with me in 6 months     SIGNATURE    Dr. Brand Males, M.D., F.C.C.P,  Pulmonary and Critical Care Medicine Staff Physician, [place] Director - Interstitial Lung Disease  Program  Pulmonary [place] at [place], [place], [address]  Pager: [phone], If no answer or between  15:00h - 7:00h: call [phone] Telephone: [phone]  4:50 PM 09/26/2018

## 2018-09-29 NOTE — Telephone Encounter (Signed)
She already did the one from recent office visit and B cells are very low. So, no need for rituxan now but she just needs to go again to [facility] in 3 months (please note 3 months) and do test and I will call her with results  Thanks  MR

## 2018-09-30 NOTE — Progress Notes (Signed)
Cardiology Office Note   Date:  10/02/2018   ID:  Catherine Aguilar, DOB 1956-03-05, MRN [id number]  PCP:  Patient, No Pcp Per  Cardiologist:   Jenkins Rouge, MD   No chief complaint on file.     History of Present Illness: Catherine Aguilar is a 64 y.o. female who presents for consultation regarding CAD. Referred by pulmonary Dr Chase Caller She has a history of vasculitis with previous alveolar hemorrhage and ARF. CRF;s include HTN , HLD and DM Being Rx with prednisone and Rituxan She continues to smoke Chronic cough Hi Res CT chest done on 08/01/18 no ILD , some post infectious scarring Noted 3 vessel coronary atherosclerosis as well as aortic atherosclerosis. She has no documented clinical CAD. No previous stress testing Last echo was in 2013 with EF 60-65%.  Her baseline Cr is close to 3 She denies angina chronic exertional dyspnea that seems functional. 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 Acute renal failure (ARF) ([place]) 12/08/2016  . Acute respiratory failure with hypoxia ([place]) 12/08/2016  . AKI (acute kidney injury) ([place]) 12/08/2016  . Allergy   . Anemia   . Asthma with COPD (chronic obstructive pulmonary disease) ([place]) 11/25/2017  . CAP (community acquired pneumonia) 12/08/2016  . Chronic kidney disease   . CKD (chronic kidney disease), stage IV ([place]) 02/11/2017  . 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 CVA (cerebral vascular accident) ([place]) 07/03/2012  . Diffuse pulmonary alveolar hemorrhage   . Edema of right lower extremity 03/25/2017  . Essential hypertension 12/08/2016  . Hemoptysis 12/08/2016  . High cholesterol   . HLD (hyperlipidemia) 12/08/2016  . Hx of adenomatous polyp of colon 04/16/2017  . Hypertension    stopped taking medications 10 years ago  . Hypokalemia 07/02/2012  . Intra-alveolar hemorrhage 12/08/2016  . Left facial numbness  07/02/2012  . Sepsis ([place]) 12/08/2016  . Shortness of breath 12/02/2017  . Stroke ([place])   . Substance abuse ([place]) 12/08/2016  . Systemic vasculitis syndrome ([place]) 12/08/2016  . Tobacco abuse 12/08/2016  . Type 2 diabetes mellitus without complication, with long-term current use of insulin ([place]) 02/11/2017  . Wegener's granulomatosis with renal involvement ([place]) 12/08/2016    Past Surgical History:  Procedure Laterality Date  . ABDOMINAL HYSTERECTOMY  1985     Current Outpatient Medications  Medication Sig Dispense Refill  . ACCU-CHEK AVIVA PLUS test strip USE TO CHECK BLOOD SUGAR IN THE MORNING AND IN THE EVENING 100 each 4  . albuterol (PROVENTIL HFA;VENTOLIN HFA) 108 (90 Base) MCG/ACT inhaler INHALE 2 PUFFS EVERY 4-6 HOURS 6.7 Inhaler 1  . albuterol (PROVENTIL) (2.5 MG/3ML) 0.083% nebulizer solution Take 3 mLs (2.5 mg total) by nebulization every 6 (six) hours as needed for wheezing or shortness of breath. 150 mL 1  . aspirin 81 MG EC tablet TAKE 1 TABLET (81 MG TOTAL) BY MOUTH DAILY. SWALLOW WHOLE. 90 tablet 3  . B-D UF III MINI PEN NEEDLES 31G X 5 MM MISC 1 UNITS BY DOES NOT APPLY ROUTE 3 (THREE) TIMES DAILY. 100 each 2  . Blood Glucose Monitoring Suppl (ACCU-CHEK AVIVA PLUS) w/Device KIT Check blood sugar as instructed 1 kit 0  . hydrALAZINE (APRESOLINE) 100 MG tablet Take 100 mg by mouth 3 (three) times daily.    . Insulin Glargine (LANTUS SOLOSTAR) 100 UNIT/ML Solostar Pen  Inject 5 Units into the skin daily at 10 pm. 5 pen PRN  . MICROLET LANCETS MISC Use as directed to test three times a day. ICD-10 code: E11.9.  Please use Counselling psychologist. 100 each 3  . pravastatin (PRAVACHOL) 40 MG tablet TAKE 1 TABLET BY MOUTH AT BEDTIME FOR CHOLESTEROL 90 tablet 3  . sulfamethoxazole-trimethoprim (BACTRIM DS,SEPTRA DS) 800-160 MG tablet Take 1 tablet by mouth 3 (three) times a week. 12 tablet 5   No current facility-administered medications for this visit.     Allergies:   Patient  has no known allergies.    Social History:  The patient  reports that she has been smoking cigarettes. She started smoking about 50 years ago. She has a 9.80 pack-year smoking history. She has never used smokeless tobacco. She reports current alcohol use. She reports current drug use. Drug: Marijuana.   Family History:  The patient's family history includes Alcohol abuse in her father; Cancer in her father; Diabetes in her father and mother; Heart attack in her mother; Hypertension in her father and mother; Stroke in her mother.    ROS:  Please see the history of present illness.   Otherwise, review of systems are positive for none.   All other systems are reviewed and negative.    PHYSICAL EXAM: VS:  BP (!) 154/90   Pulse (!) 51   Ht 5' 7" (1.702 m)   Wt 212 lb 1.9 oz (96.2 kg)   SpO2 94%   BMI 33.22 kg/m  , BMI Body mass index is 33.22 kg/m. Affect appropriate Healthy:  appears stated age 24: normal Neck supple with no adenopathy JVP normal no bruits no thyromegaly Lungs clear with no wheezing and good diaphragmatic motion Heart:  S1/S2 no murmur, no rub, gallop or click PMI normal Abdomen: benighn, BS positve, no tenderness, no AAA no bruit.  No HSM or HJR Distal pulses intact with no bruits No edema Neuro non-focal Skin warm and dry No muscular weakness    EKG:  SR rate 92 LVH 09/03/17   Recent Labs: 05/26/2018: ALT 16; BUN 67; Creatinine, Ser 2.98; Platelets 247.0; Potassium 4.1; Sodium 139 09/18/2018: Hemoglobin 11.9    Lipid Panel    Component Value Date/Time   CHOL 161 07/03/2012 0505   TRIG 121 07/03/2012 0505   HDL 39 (L) 07/03/2012 0505   CHOLHDL 4.1 07/03/2012 0505   VLDL 24 07/03/2012 0505   LDLCALC 98 07/03/2012 0505      Wt Readings from Last 3 Encounters:  10/02/18 212 lb 1.9 oz (96.2 kg)  09/23/18 215 lb 12.8 oz (97.9 kg)  05/26/18 211 lb 9.6 oz (96 kg)      Other studies Reviewed: Additional studies/ records that were reviewed today  include: Notes from pulmonary CT scan labs and ECG .    ASSESSMENT AND PLAN:  1.  CAD: sub clinical to be expected in chronically ill smoker with vasculitis and multiple CRF;s. No correlation between seeing epicardial calcium on CT and obstructive disease Reasonable to proceed with Ex  Myovue to risk stratify. Would have to be high risk to cath in setting of CRF and asymptomatic state 2. HTN:  Well controlled.  Continue current medications and low sodium Dash type diet.   3. DM:  Discussed low carb diet.  Target hemoglobin A1c is 6.5 or less.  Continue current medications. 4. Vasculitis:  F/u pulmonary continue prednisone and pulse Rituxan 5. CRF:  Baseline Cr around 3 avoid dehydration and  nephrotoxic agents f/u Lorrene Reid    Current medicines are reviewed at length with the patient today.  The patient does not have concerns regarding medicines.  The following changes have been made:  no change  Labs/ tests ordered today include: Lexiscan Myovue   Orders Placed This Encounter  Procedures  . MYOCARDIAL PERFUSION IMAGING  . EKG 12-Lead     Disposition:   FU with cardiology PRN      Signed, Jenkins Rouge, MD  10/02/2018 11:54 AM    [facility] [place], [place], [place]  [address] Phone: [phone]; Fax: [phone]

## 2018-10-02 ENCOUNTER — Ambulatory Visit (INDEPENDENT_AMBULATORY_CARE_PROVIDER_SITE_OTHER): Payer: Medicaid Other | Admitting: Cardiovascular Disease

## 2018-10-02 ENCOUNTER — Encounter: Payer: Self-pay | Admitting: Cardiovascular Disease

## 2018-10-02 VITALS — BP 154/90 | HR 51 | Ht 67.0 in | Wt 212.1 lb

## 2018-10-02 DIAGNOSIS — I2510 Atherosclerotic heart disease of native coronary artery without angina pectoris: Secondary | ICD-10-CM | POA: Diagnosis not present

## 2018-10-02 DIAGNOSIS — I10 Essential (primary) hypertension: Secondary | ICD-10-CM

## 2018-10-02 NOTE — Patient Instructions (Addendum)
Medication Instructions:   If you need a refill on your cardiac medications before your next appointment, please call your pharmacy.   Lab work:  If you have labs (blood work) drawn today and your tests are completely normal, you will receive your results only by: Marland Kitchen MyChart Message (if you have MyChart) OR . A paper copy in the mail If you have any lab test that is abnormal or we need to change your treatment, we will call you to review the results.  Testing/Procedures: Your physician has requested that you have en exercise stress myoview. For further information please visit [email or web address]. Please follow instruction sheet, as given.  Follow-Up: At [facility], you and your health needs are our priority.  As part of our continuing mission to provide you with exceptional heart care, we have created designated Provider Care Teams.  These Care Teams include your primary Cardiologist (physician) and Advanced Practice Providers (APPs -  Physician Assistants and Nurse Practitioners) who all work together to provide you with the care you need, when you need it. Your physician recommends that you schedule a follow-up appointment as needed with Dr. Johnsie Cancel.

## 2018-10-06 ENCOUNTER — Telehealth (HOSPITAL_COMMUNITY): Payer: Self-pay

## 2018-10-06 NOTE — Telephone Encounter (Signed)
Patient contacted and detailed instructions were given. Patient stated that she would be here for her test. Catherine Aguilar EMTP

## 2018-10-07 ENCOUNTER — Telehealth: Payer: Self-pay | Admitting: Cardiovascular Disease

## 2018-10-07 NOTE — Telephone Encounter (Signed)
Edit order for Lexiscan per Dr. Johnsie Cancel office note instead of Exercise Myoview. Patient is aware.

## 2018-10-07 NOTE — Telephone Encounter (Signed)
New Message   Patient scheduled 2/4 @ 10am for a Myocardial Perfusion and states she doesn't want to do the treadmill she rather get the injection as Dr. Johnsie Cancel explained to her.

## 2018-10-07 NOTE — Addendum Note (Signed)
Addended by: Aris Georgia, Nikeisha Klutz L on: 10/07/2018 08:37 AM   Modules accepted: Orders

## 2018-10-07 NOTE — Telephone Encounter (Signed)
Spoke with pt, I advised her of MR message. Catherine Aguilar can you place a reminder to call pt when she needs to go in three months per her request. Thank you.

## 2018-10-08 NOTE — Telephone Encounter (Signed)
I have placed a reminder for me to call pt in April to have her go get B Cell done. Nothing further needed.

## 2018-10-09 ENCOUNTER — Telehealth (HOSPITAL_COMMUNITY): Payer: Self-pay | Admitting: *Deleted

## 2018-10-09 ENCOUNTER — Encounter (HOSPITAL_COMMUNITY): Payer: Medicaid Other

## 2018-10-09 NOTE — Telephone Encounter (Signed)
Left message on voicemail in reference to upcoming appointment scheduled for 10/14/18 Phone number given for a call back so details instructions can be given.  Kirstie Peri, RN

## 2018-10-10 IMAGING — DX DG CHEST 2V
2 series · 2 of 2 positions shown · non-contrast
Comparison: 12/12/2016

CLINICAL DATA: Cough and congestion

EXAM:
CHEST  2 VIEW

[chest pa]
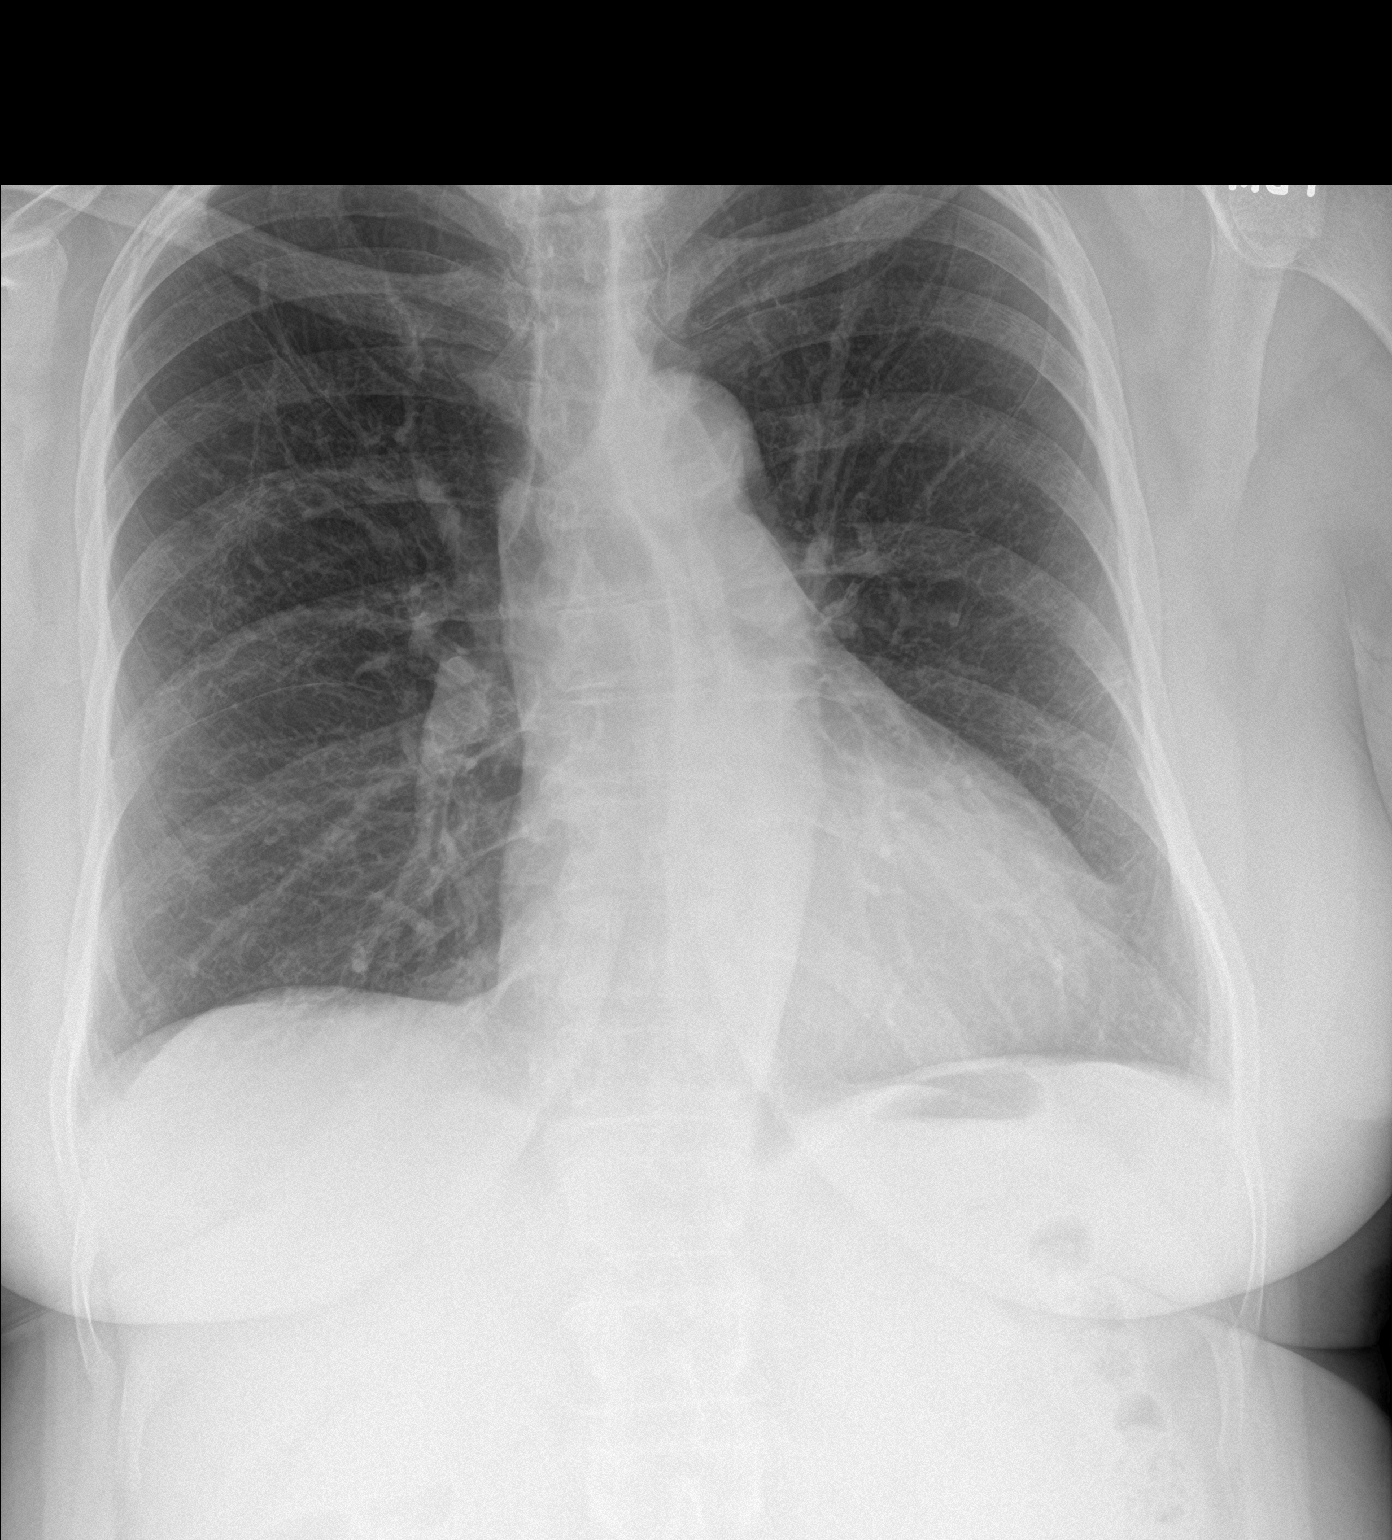

[chest lat]
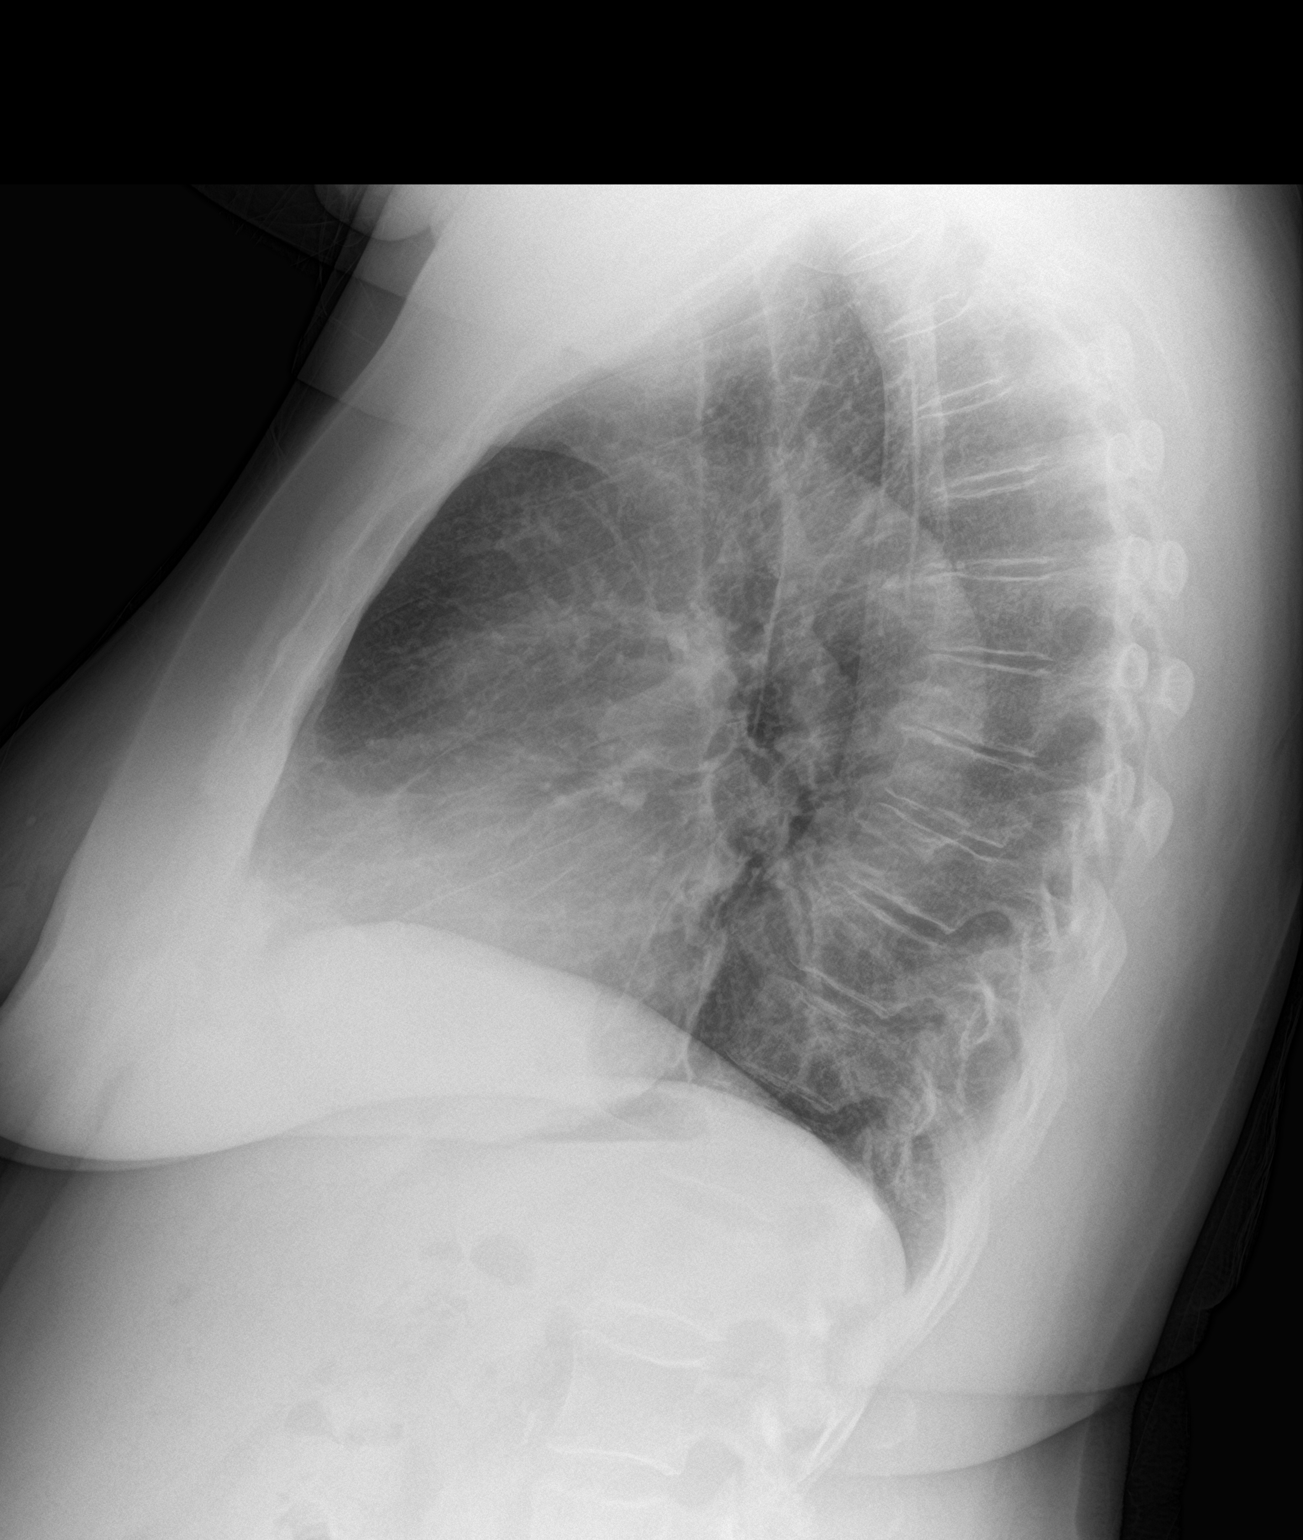

[2 of 2 positions shown; findings below may reference images not displayed]

FINDINGS: Cardiac shadow is within normal limits. The lungs are well aerated
bilaterally. No focal infiltrate or sizable effusion is seen.
Previously seen right upper lobe infiltrate has resolved. No acute
bony abnormality is noted.
IMPRESSION: Resolution of previously seen right upper lobe infiltrate.

## 2018-10-14 ENCOUNTER — Ambulatory Visit (HOSPITAL_COMMUNITY): Payer: Medicaid Other | Attending: Cardiology

## 2018-10-14 DIAGNOSIS — I10 Essential (primary) hypertension: Secondary | ICD-10-CM | POA: Diagnosis not present

## 2018-10-14 DIAGNOSIS — I2510 Atherosclerotic heart disease of native coronary artery without angina pectoris: Secondary | ICD-10-CM | POA: Diagnosis present

## 2018-10-14 LAB — MYOCARDIAL PERFUSION IMAGING
CHL CUP NUCLEAR SDS: 2
CSEPPHR: 100 {beats}/min
LV dias vol: 101 mL (ref 46–106)
LV sys vol: 42 mL
Rest HR: 60 {beats}/min
SRS: 1
SSS: 3
TID: 0.98

## 2018-10-14 MED ORDER — REGADENOSON 0.4 MG/5ML IV SOLN
0.4000 mg | Freq: Once | INTRAVENOUS | Status: AC
  Administered 2018-10-14: 0.4 mg via INTRAVENOUS

## 2018-10-14 MED ORDER — TECHNETIUM TC 99M TETROFOSMIN IV KIT
10.0000 | PACK | Freq: Once | INTRAVENOUS | Status: AC | PRN
  Administered 2018-10-14: 10 via INTRAVENOUS
  Filled 2018-10-14: qty 10

## 2018-10-14 MED ORDER — TECHNETIUM TC 99M TETROFOSMIN IV KIT
32.3000 | PACK | Freq: Once | INTRAVENOUS | Status: AC | PRN
  Administered 2018-10-14: 32.3 via INTRAVENOUS
  Filled 2018-10-14: qty 33

## 2018-10-16 ENCOUNTER — Ambulatory Visit (HOSPITAL_COMMUNITY)
Admission: RE | Admit: 2018-10-16 | Discharge: 2018-10-16 | Disposition: A | Discharge status: Home / Self Care | Payer: Medicaid Other | Source: Ambulatory Visit | Attending: Nephrology | Admitting: Nephrology

## 2018-10-16 VITALS — BP 136/71 | HR 66 | Temp 98.9°F | Resp 18

## 2018-10-16 DIAGNOSIS — N170 Acute kidney failure with tubular necrosis: Secondary | ICD-10-CM

## 2018-10-16 LAB — IRON AND TIBC
Iron: 53 ug/dL (ref 28–170)
Saturation Ratios: 15 % (ref 10.4–31.8)
TIBC: 343 ug/dL (ref 250–450)
UIBC: 290 ug/dL

## 2018-10-16 LAB — FERRITIN: Ferritin: 109 ng/mL (ref 11–307)

## 2018-10-16 LAB — POCT HEMOGLOBIN-HEMACUE: Hemoglobin: 11.2 g/dL — ABNORMAL LOW (ref 12.0–15.0)

## 2018-10-16 MED ORDER — DARBEPOETIN ALFA 150 MCG/0.3ML IJ SOSY
150.0000 ug | PREFILLED_SYRINGE | INTRAMUSCULAR | Status: DC
  Administered 2018-10-16: 150 ug via SUBCUTANEOUS

## 2018-10-16 MED ORDER — DARBEPOETIN ALFA 150 MCG/0.3ML IJ SOSY
PREFILLED_SYRINGE | INTRAMUSCULAR | Status: AC
  Administered 2018-10-16: 150 ug via SUBCUTANEOUS
  Filled 2018-10-16: qty 0.3

## 2018-11-06 ENCOUNTER — Other Ambulatory Visit: Payer: Self-pay

## 2018-11-13 ENCOUNTER — Ambulatory Visit (HOSPITAL_COMMUNITY)
Admission: RE | Admit: 2018-11-13 | Discharge: 2018-11-13 | Disposition: A | Discharge status: Home / Self Care | Payer: Medicaid Other | Source: Ambulatory Visit | Attending: Nephrology | Admitting: Nephrology

## 2018-11-13 VITALS — BP 172/87 | HR 57 | Temp 98.9°F | Resp 20

## 2018-11-13 DIAGNOSIS — N170 Acute kidney failure with tubular necrosis: Secondary | ICD-10-CM | POA: Insufficient documentation

## 2018-11-13 LAB — POCT HEMOGLOBIN-HEMACUE: Hemoglobin: 11.9 g/dL — ABNORMAL LOW (ref 12.0–15.0)

## 2018-11-13 LAB — IRON AND TIBC
Iron: 62 ug/dL (ref 28–170)
Saturation Ratios: 17 % (ref 10.4–31.8)
TIBC: 358 ug/dL (ref 250–450)
UIBC: 296 ug/dL

## 2018-11-13 LAB — FERRITIN: FERRITIN: 65 ng/mL (ref 11–307)

## 2018-11-13 MED ORDER — DARBEPOETIN ALFA 150 MCG/0.3ML IJ SOSY
150.0000 ug | PREFILLED_SYRINGE | INTRAMUSCULAR | Status: DC
  Administered 2018-11-13: 150 ug via SUBCUTANEOUS

## 2018-11-13 MED ORDER — DARBEPOETIN ALFA 150 MCG/0.3ML IJ SOSY
PREFILLED_SYRINGE | INTRAMUSCULAR | Status: AC
  Filled 2018-11-13: qty 0.3

## 2018-11-17 ENCOUNTER — Other Ambulatory Visit: Payer: Self-pay

## 2018-11-17 MED ORDER — ALBUTEROL SULFATE HFA 108 (90 BASE) MCG/ACT IN AERS: INHALATION_SPRAY | RESPIRATORY_TRACT | 1 refills | Status: DC

## 2018-12-04 ENCOUNTER — Other Ambulatory Visit: Payer: Self-pay

## 2018-12-10 MED ORDER — ALBUTEROL SULFATE (2.5 MG/3ML) 0.083% IN NEBU: 2.5000 mg | INHALATION_SOLUTION | Freq: Four times a day (QID) | RESPIRATORY_TRACT | 1 refills | Status: DC | PRN

## 2018-12-11 ENCOUNTER — Encounter (HOSPITAL_COMMUNITY): Payer: Medicaid Other

## 2018-12-23 ENCOUNTER — Telehealth: Payer: Self-pay | Admitting: Internal Medicine

## 2018-12-23 NOTE — Telephone Encounter (Signed)
At pt's last OV with MR 09/23/2018, a reminder was placed for pt to have labs done at [facility] for the b-cell flow cytometry in April 2020.  MR, due to COVID-19, please advise if you want to push this back until restrictions are lifted with COVID or if you still want pt to go ahead and get labs done. Thanks!

## 2018-12-23 NOTE — Telephone Encounter (Signed)
If she is not having rash, hemoptysis or hematuria - she can push this back to end of may 2020  Thanks  MR

## 2018-12-24 NOTE — Telephone Encounter (Signed)
Patient returned call that she missed and would like a call back she can be reached at [phone]

## 2018-12-24 NOTE — Telephone Encounter (Signed)
Attempted to call pt but unable to reach. Left message for pt to return call. 

## 2018-12-24 NOTE — Telephone Encounter (Signed)
Spoke with the pt and notified of recs per MR  She verbalized understanding  She will call to reschedule this  Nothing further needed

## 2019-01-15 ENCOUNTER — Ambulatory Visit (HOSPITAL_COMMUNITY)
Admission: RE | Admit: 2019-01-15 | Discharge: 2019-01-15 | Disposition: A | Discharge status: Home / Self Care | Payer: Medicaid Other | Source: Ambulatory Visit | Attending: Nephrology | Admitting: Nephrology

## 2019-01-15 ENCOUNTER — Other Ambulatory Visit: Payer: Self-pay

## 2019-01-15 VITALS — BP 134/76 | HR 61 | Temp 98.9°F | Resp 18

## 2019-01-15 DIAGNOSIS — N170 Acute kidney failure with tubular necrosis: Secondary | ICD-10-CM | POA: Diagnosis present

## 2019-01-15 LAB — IRON AND TIBC
Iron: 54 ug/dL (ref 28–170)
Saturation Ratios: 15 % (ref 10.4–31.8)
TIBC: 361 ug/dL (ref 250–450)
UIBC: 307 ug/dL

## 2019-01-15 LAB — POCT HEMOGLOBIN-HEMACUE: Hemoglobin: 11.2 g/dL — ABNORMAL LOW (ref 12.0–15.0)

## 2019-01-15 LAB — FERRITIN: Ferritin: 77 ng/mL (ref 11–307)

## 2019-01-15 MED ORDER — DARBEPOETIN ALFA 150 MCG/0.3ML IJ SOSY
PREFILLED_SYRINGE | INTRAMUSCULAR | Status: AC
  Filled 2019-01-15: qty 0.3

## 2019-01-15 MED ORDER — DARBEPOETIN ALFA 150 MCG/0.3ML IJ SOSY
150.0000 ug | PREFILLED_SYRINGE | INTRAMUSCULAR | Status: DC
  Administered 2019-01-15: 150 ug via SUBCUTANEOUS

## 2019-01-20 ENCOUNTER — Other Ambulatory Visit: Payer: Self-pay

## 2019-01-21 MED ORDER — LINACLOTIDE 72 MCG PO CAPS: 72.0000 ug | ORAL_CAPSULE | Freq: Every day | ORAL | 0 refills | Status: DC

## 2019-02-05 ENCOUNTER — Other Ambulatory Visit: Payer: Self-pay | Admitting: Internal Medicine

## 2019-02-12 ENCOUNTER — Ambulatory Visit (HOSPITAL_COMMUNITY)
Admission: RE | Admit: 2019-02-12 | Discharge: 2019-02-12 | Disposition: A | Discharge status: Home / Self Care | Payer: Medicaid Other | Source: Ambulatory Visit | Attending: Nephrology | Admitting: Nephrology

## 2019-02-12 ENCOUNTER — Other Ambulatory Visit: Payer: Self-pay

## 2019-02-12 VITALS — BP 150/78 | HR 67 | Temp 97.7°F | Resp 18

## 2019-02-12 DIAGNOSIS — N170 Acute kidney failure with tubular necrosis: Secondary | ICD-10-CM

## 2019-02-12 LAB — POCT HEMOGLOBIN-HEMACUE: Hemoglobin: 12 g/dL (ref 12.0–15.0)

## 2019-02-12 LAB — IRON AND TIBC
Iron: 49 ug/dL (ref 28–170)
Saturation Ratios: 13 % (ref 10.4–31.8)
TIBC: 379 ug/dL (ref 250–450)
UIBC: 330 ug/dL

## 2019-02-12 LAB — FERRITIN: Ferritin: 51 ng/mL (ref 11–307)

## 2019-02-12 MED ORDER — DARBEPOETIN ALFA 150 MCG/0.3ML IJ SOSY: 150.0000 ug | PREFILLED_SYRINGE | INTRAMUSCULAR | Status: DC

## 2019-02-26 ENCOUNTER — Other Ambulatory Visit: Payer: Self-pay

## 2019-02-26 ENCOUNTER — Encounter (HOSPITAL_COMMUNITY)
Admission: RE | Admit: 2019-02-26 | Discharge: 2019-02-26 | Disposition: A | Discharge status: Home / Self Care | Payer: Medicaid Other | Source: Ambulatory Visit | Attending: Nephrology | Admitting: Nephrology

## 2019-02-26 VITALS — BP 147/78 | HR 64 | Temp 97.8°F | Resp 18

## 2019-02-26 DIAGNOSIS — N170 Acute kidney failure with tubular necrosis: Secondary | ICD-10-CM | POA: Insufficient documentation

## 2019-02-26 LAB — POCT HEMOGLOBIN-HEMACUE: Hemoglobin: 11.9 g/dL — ABNORMAL LOW (ref 12.0–15.0)

## 2019-02-26 MED ORDER — DARBEPOETIN ALFA 150 MCG/0.3ML IJ SOSY
150.0000 ug | PREFILLED_SYRINGE | INTRAMUSCULAR | Status: DC
  Administered 2019-02-26: 150 ug via SUBCUTANEOUS

## 2019-02-26 MED ORDER — DARBEPOETIN ALFA 150 MCG/0.3ML IJ SOSY
PREFILLED_SYRINGE | INTRAMUSCULAR | Status: AC
  Administered 2019-02-26: 150 ug via SUBCUTANEOUS
  Filled 2019-02-26: qty 0.3

## 2019-03-12 ENCOUNTER — Encounter (HOSPITAL_COMMUNITY): Payer: Medicaid Other

## 2019-03-26 ENCOUNTER — Encounter (HOSPITAL_COMMUNITY): Payer: Medicaid Other

## 2019-04-02 ENCOUNTER — Other Ambulatory Visit: Payer: Self-pay

## 2019-04-02 ENCOUNTER — Ambulatory Visit (HOSPITAL_COMMUNITY)
Admission: RE | Admit: 2019-04-02 | Discharge: 2019-04-02 | Disposition: A | Discharge status: Home / Self Care | Payer: Medicaid Other | Source: Ambulatory Visit | Attending: Nephrology | Admitting: Nephrology

## 2019-04-02 VITALS — BP 155/82 | HR 75 | Temp 98.0°F | Resp 18

## 2019-04-02 DIAGNOSIS — N170 Acute kidney failure with tubular necrosis: Secondary | ICD-10-CM | POA: Diagnosis present

## 2019-04-02 LAB — IRON AND TIBC
Iron: 40 ug/dL (ref 28–170)
Saturation Ratios: 11 % (ref 10.4–31.8)
TIBC: 358 ug/dL (ref 250–450)
UIBC: 318 ug/dL

## 2019-04-02 LAB — FERRITIN: Ferritin: 64 ng/mL (ref 11–307)

## 2019-04-02 LAB — POCT HEMOGLOBIN-HEMACUE: Hemoglobin: 11.4 g/dL — ABNORMAL LOW (ref 12.0–15.0)

## 2019-04-02 MED ORDER — DARBEPOETIN ALFA 150 MCG/0.3ML IJ SOSY
PREFILLED_SYRINGE | INTRAMUSCULAR | Status: AC
  Filled 2019-04-02: qty 0.3

## 2019-04-02 MED ORDER — DARBEPOETIN ALFA 150 MCG/0.3ML IJ SOSY
150.0000 ug | PREFILLED_SYRINGE | INTRAMUSCULAR | Status: DC
  Administered 2019-04-02: 150 ug via SUBCUTANEOUS

## 2019-04-20 ENCOUNTER — Telehealth: Payer: Self-pay | Admitting: Internal Medicine

## 2019-04-20 NOTE — Telephone Encounter (Signed)
FYI for MR. The records from [facility] is in MR box.

## 2019-04-21 NOTE — Telephone Encounter (Signed)
Called and spoke with patient. Scheduled her for appt on August  Schedule with MR. Nothing further needed at this time.

## 2019-04-21 NOTE — Telephone Encounter (Signed)
pls get hjer in for one of my august appt./ Will consider rituxan

## 2019-04-29 ENCOUNTER — Other Ambulatory Visit (HOSPITAL_COMMUNITY): Payer: Self-pay

## 2019-04-30 ENCOUNTER — Other Ambulatory Visit: Payer: Self-pay

## 2019-04-30 ENCOUNTER — Ambulatory Visit (HOSPITAL_COMMUNITY)
Admission: RE | Admit: 2019-04-30 | Discharge: 2019-04-30 | Disposition: A | Discharge status: Home / Self Care | Payer: Medicaid Other | Source: Ambulatory Visit | Attending: Nephrology | Admitting: Nephrology

## 2019-04-30 VITALS — BP 169/86 | HR 63 | Temp 97.6°F | Resp 18 | Ht 67.0 in | Wt 220.0 lb

## 2019-04-30 DIAGNOSIS — N170 Acute kidney failure with tubular necrosis: Secondary | ICD-10-CM | POA: Diagnosis present

## 2019-04-30 LAB — POCT HEMOGLOBIN-HEMACUE: Hemoglobin: 12.1 g/dL (ref 12.0–15.0)

## 2019-04-30 MED ORDER — DARBEPOETIN ALFA 150 MCG/0.3ML IJ SOSY: 150.0000 ug | PREFILLED_SYRINGE | INTRAMUSCULAR | Status: DC

## 2019-04-30 MED ORDER — SODIUM CHLORIDE 0.9 % IV SOLN
510.0000 mg | Freq: Once | INTRAVENOUS | Status: AC
  Administered 2019-04-30: 13:00:00 510 mg via INTRAVENOUS
  Filled 2019-04-30: qty 17

## 2019-05-07 ENCOUNTER — Ambulatory Visit (INDEPENDENT_AMBULATORY_CARE_PROVIDER_SITE_OTHER): Payer: Medicaid Other

## 2019-05-07 ENCOUNTER — Ambulatory Visit: Payer: Medicaid Other | Admitting: Internal Medicine

## 2019-05-07 ENCOUNTER — Encounter: Payer: Self-pay | Admitting: Internal Medicine

## 2019-05-07 ENCOUNTER — Other Ambulatory Visit: Payer: Self-pay

## 2019-05-07 VITALS — BP 130/72 | HR 64 | Temp 97.9°F | Ht 67.0 in | Wt 219.6 lb

## 2019-05-07 DIAGNOSIS — M318 Other specified necrotizing vasculopathies: Secondary | ICD-10-CM | POA: Diagnosis not present

## 2019-05-07 DIAGNOSIS — Z5181 Encounter for therapeutic drug level monitoring: Secondary | ICD-10-CM | POA: Diagnosis not present

## 2019-05-07 DIAGNOSIS — F17200 Nicotine dependence, unspecified, uncomplicated: Secondary | ICD-10-CM | POA: Diagnosis not present

## 2019-05-07 DIAGNOSIS — Z79899 Other long term (current) drug therapy: Secondary | ICD-10-CM | POA: Diagnosis not present

## 2019-05-07 DIAGNOSIS — J439 Emphysema, unspecified: Secondary | ICD-10-CM

## 2019-05-07 LAB — BASIC METABOLIC PANEL
BUN: 53 mg/dL — ABNORMAL HIGH (ref 6–23)
CO2: 22 mEq/L (ref 19–32)
Calcium: 9.8 mg/dL (ref 8.4–10.5)
Chloride: 107 mEq/L (ref 96–112)
Creatinine, Ser: 2.66 mg/dL — ABNORMAL HIGH (ref 0.40–1.20)
GFR: 21.83 mL/min — ABNORMAL LOW (ref >60.00)
Glucose, Bld: 165 mg/dL — ABNORMAL HIGH (ref 70–99)
Potassium: 4.5 mEq/L (ref 3.5–5.1)
Sodium: 137 mEq/L (ref 135–145)

## 2019-05-07 LAB — URINALYSIS, ROUTINE W REFLEX MICROSCOPIC
Bilirubin Urine: NEGATIVE
Hgb urine dipstick: NEGATIVE
Ketones, ur: NEGATIVE
Leukocytes,Ua: NEGATIVE
Nitrite: NEGATIVE
Specific Gravity, Urine: 1.015 (ref 1.000–1.030)
Total Protein, Urine: 30 — AB
Urine Glucose: NEGATIVE
Urobilinogen, UA: 0.2 (ref 0.0–1.0)
pH: 5.5 (ref 5.0–8.0)

## 2019-05-07 LAB — CBC
HCT: 39 % (ref 36.0–46.0)
Hemoglobin: 12.2 g/dL (ref 12.0–15.0)
MCHC: 31.2 g/dL (ref 30.0–36.0)
MCV: 82.3 fl (ref 78.0–100.0)
Platelets: 187 10*3/uL (ref 150.0–400.0)
RBC: 4.74 Mil/uL (ref 3.87–5.11)
RDW: 15.8 % — ABNORMAL HIGH (ref 11.5–15.5)
WBC: 6.6 10*3/uL (ref 4.0–10.5)

## 2019-05-07 MED ORDER — SPIRIVA RESPIMAT 1.25 MCG/ACT IN AERS: 2.0000 | INHALATION_SPRAY | Freq: Every day | RESPIRATORY_TRACT | 6 refills | Status: DC

## 2019-05-07 MED ORDER — SPIRIVA RESPIMAT 1.25 MCG/ACT IN AERS: 2.0000 | INHALATION_SPRAY | Freq: Every day | RESPIRATORY_TRACT | 0 refills | Status: DC

## 2019-05-07 NOTE — Addendum Note (Signed)
Addended by: Nena Polio on: 05/07/2019 10:04 AM   Modules accepted: Orders

## 2019-05-07 NOTE — Addendum Note (Signed)
Addended by: Suzzanne Cloud E on: 05/07/2019 10:01 AM   Modules accepted: Orders

## 2019-05-07 NOTE — Progress Notes (Signed)
OV 05/26/2018  Subjective:  Patient ID: Gwenith Spitz, female , DOB: 02-15-1956 , age 63 y.o. , MRN: [id number] , ADDRESS: [place] [address]   OV 02/12/2017  Chief Complaint  Patient presents with  . Follow-up    Pt states her breathing is doing well. Pt denies any cough, SOB, CP/tightness, and f/c/s.    Follow-up severe mid vasculitis syndrome with life-threatening alveolar hemorrhage and acute renal failure is easter weekend 2018  Currently she is doing well. She has finished 4 doses of Rituxan. Last dose was 01/31/2017. There was significant delay between the first 2 doses which were on schedule in the third and fourth doses delayed. Still on prednisone 60 mg per day. She is accompanied by her daughter [name]. Overall she's doing well. This no hemoptysis anymore. This no shortness of breath. Her G6PD was normal in May 2018. Her Quantiferon Gold was normal. Her most recent chemistries were in May 2018 and a creatinine that improved with 3 mg percent approximately.  OV 05/03/2017  Chief Complaint  Patient presents with  . Follow-up    Pt states her breathing is unchanged since last OV. Pt denies cough, CP/tightness, f/c/s.       63 year old female with systemic vasculitis syndrome. Last dose of Rituxan was in May 2018. She is currently tapered her prednisone down to 10 mg per day. She is on Bactrim prophylaxis. She has visited with nephrology Dr. Lu Duffel in the last 1 month. According to her history blood tests have improved. She does not want to have repeat blood test today. Last blood test was within a month. She is getting iron infusions at short stay. According to the daughter blood pressure was high and one of the infusions was held off. There is no hemoptysis chest pain or hematuria or any clinical evidence of vasculitis. She is due for flu shot.   OV 09/19/2017  Chief Complaint  Patient presents with  . Follow-up    Pt was seen at the  hospital 09/02/17 due to breathing problems and after doing breathing treatments, was sent home. Other than that, pt states that she has been doing good.     63 year old female with systemic vasculitis syndrome.  Last dose of Rituxan May 2018.  She had life-threatening hemoptysis and respiratory failure and renal failure back in early 2018.  Last seen in August 2018.  After that she did not follow-up.  She is here with her daughter.  It appears that Christmas Eve 2018 she decided to smoke cigarettes because of the holidays when she got acute respiratory exacerbation with wheezing and went to the emergency department.  I personally reviewed the chart and the chest x-ray that was clear.  She was discharged with a steroid burst.  Now she is back to baseline prednisone 10 mg/day and her baseline Bactrim for her Wegener's vasculitis.  She tells me that she is getting Rituxan shots once a month at the short stay hospital at [facility] but when I reviewed the chart this is actually [place].  She is due for a B-cell flow cytometry test today because it has been 9-10 months.  Overall she is feeling fine except fatigue.  She tells me that she does not have any eye issues or neuritis of mononeuritis multiplex or renal issues although most recent creatinine continues to be on 3 mg percent.  There is no hemoptysis or shortness of breath currently.   OV 01/21/2018  Chief  Complaint  Patient presents with  . Follow-up    Pt states she has been doing good since last visit but states she has an uncontrollable cough that she has had since March 2018 that will not go away. Pt is not coughing up any mucus but does have postnasal drainage that is clear.    63 year old female with systemic vasculitis syndrome with life-threatening hemoptysis and respiratory and renal failure in early 2018.Marland Kitchen  Last dose of Rituxan May 2018.  Since then on daily prednisone and Bactrim prophylaxis.  Last B-cell flow cytometry January 2019  showing suppression of B cells.  She presents with her daughter.  She as usual is a poor historian.  As best as I can gather it appears since her last visit she is having recurrent episodes of bronchitis and has been treated with 2 rounds of antibiotics/2 rounds of prednisone burst including a February 2019 visit to the emergency department where she had a chest x-ray that I personally visualized and it looks clear.  She also feels that she is having a chronic cough that is worse than her baseline.  She tells me now that she has had this cough since March 2018 when she suffered from vasculitis syndrome but the daughter says that it is certainly worse in the last few months since the last visit associated with chest tightness.  There is no hemoptysis or edema.  In the last week or so she feels like she has had an acute sinusitis with yellow sputum and she wants a different antibiotic.  There is no fever or hemoptysis or edema orthopnea proximal nocturnal dyspnea or weight loss. FeNO 8ppb and does not suggest asthma    05/26/2018 -   Chief Complaint  Patient presents with  . Follow-up    Pt states she has been doing well since last visit except she states still has the uncontrollable cough that she is unable to get rid of. States she is coughing up clear to light yellow to green mucus and has some occ SOB when exerting herself.    63 year old female with systemic vasculitis syndrome with life-threatening hemoptysis and respiratory and renal failure in early 2018.Marland Kitchen  Last dose of Rituxan May 2018.  Since then on daily prednisone and Bactrim prophylaxis.  Last B-cell flow cytometry January 2019 showing suppression of B cells but in summer 2019 emergence with 3% B cells associated with rising MPO tites in renal clinic summer 2019. Rx for relapose 1073m IV rituxan - May 08, 2018. [facility]   Daelyn Pifer 63y.o. -presents for follow-up. Last seen in May 2019. At that time B-cell  flow cytometry was repeated and showed a 3% B-cell population which was the beginning of emergence following Rituxan a year earlier. At that point in her she was feeling okay but she was still smoking. She also had chronic cough which was controlled by  trelegy inhaler. She subsequently saw Dr. DLorrene Reidin nephrology and it was noticed that her MPO titer was coming back and clinically Dr. DLorrene Reidfelt that she was relapsing. Therefore we arranged for single dose Rituxan thousand milligrams which was administered end of August 2019 without any problems. At this point in time her main issues that she continues to smoke. She has a chronic cough which she says is severe but happens only every few weeks or every several days. She is not taking her inhaler regularly. Review of radiology showed last CT scan of the chestwas early  in 2018 and a chest x-ray in February 2019 that was clear. She continues on Bactrim. I do not see prednisone thought at last viit instructed to take pred 43m per day but she says she "finished it" which was not her instruction    OV 09/23/2018  Subjective:  Patient ID: SGwenith Spitz female , DOB: 719-Nov-1957, age 63y.o. , MRN: [id number], [place] [id number]  63year old female with systemic vasculitis syndrome with life-threatening hemoptysis and respiratory and renal failure in early 2018..Marland Kitchen Last dose of Rituxan May 2018.  Since then on daily prednisone and Bactrim prophylaxis.  Last B-cell flow cytometry January 2019 showing suppression of B cells but in summer 2019 emergence with 3% B cells associated with rising MPO tites in renal clinic summer 2019. Rx for relapose (based on b cells and creat rise) 10079mIV rituxan - May 08, 2018. [facility] 2020 but she is on bactrim for prophylaxis   09/23/2018 -   Chief Complaint  Patient presents with  . Follow-up    Pt states she has been doing well since last visit and denies any  complaints.     HPI Enora Kernen 89.o. -presents for follow-up of the above issue.  She presents by herself.  As usual she is a poor historian.  This is evidenced by the fact that she does not know that she suffers from vasculitis.  She has no idea what this time even means.  She has no idea that she received Rituxan although she recollects receiving infusion.  Overall she tells me that she is well except for mild chronic cough.  She continues to smoke.  Her last Rituxan infusion was in August 2019.  Then in November 2019 she had a CT scan of the chest that shows new findings of coronary artery calcification.  In addition she is noted to have emphysema.  No obvious evidence of any Wegener's vasculitis.  She is up-to-date with her respiratory vaccines.  She continues on Bactrim prophylaxis but is not on any inhaler therapy other than pro-air.  She is not sure about quitting smoking.    OV 05/07/2019  Subjective:  Patient ID: SuGwenith Spitzfemale , DOB: 7/04-01-1956 age [age].o. , MRN: [id number] [address] [id number] 05/07/2019 -   Chief Complaint  Patient presents with  . Systemic Vaculitis Syndrome    No changes in breathing since last visit in January 2020. Medication usage still the same.     [age]ear old female with systemic vasculitis syndrome with life-threatening hemoptysis and respiratory and renal failure in early 2018.s/p induction Rituxan ending  May 2018.  Tthen on daily prednisone and Bactrim prophylaxis.  Last B-cell flow cytometry January 2019 showing suppression of B cells but in summer 2019 emergence with 3% B cells associated with rising MPO tites in renal clinic summer 2019. Rx for relapose (based on b cells and creat rise) 10003mV rituxan - May 08, 2018. [facility], [facility] 2020 and Aug 2020 but she is on bactrim for prophylaxis  Follow-up vasculitis Follow-up emphysema Follow-up smoking  HPI Kayron Lenig [age]o.  -presents for follow-up.  Last visit was in January 2020 at which time she was in clinical remission from a vasculitis.  I recommended cardiology follow-up for pulmonary artery calcification.  She had a myocardial perfusion test in February 2020 and this was normal.  She continues to do well.  Her last Rituxan was in August 2019.  She is now worried that her vasculitis is back because she had one episode of epistaxis when she woke up in the middle of the night this was just brief when she blew her nose.  Other than that she is not had any other episode.  She not having any sinus issues.  No respiratory issues.  She says she is renal and her kidney numbers are actually doing better.  She has chronic kidney disease with a creatinine of over 2 in our health system.  She continues with Bactrim but she is not on prednisone.   For emphysema: Last visit I started on Spiriva but I do not see this:  For smoking: She continues to smoke cigarettes.    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), CVA (cerebral vascular accident) ([place]) (07/03/2012), Diffuse pulmonary alveolar hemorrhage, Edema of right lower extremity (03/25/2017), Essential hypertension (12/08/2016), Hemoptysis (12/08/2016), High cholesterol, HLD (hyperlipidemia) (12/08/2016), adenomatous polyp of colon (04/16/2017), Hypertension, Hypokalemia (07/02/2012), Intra-alveolar hemorrhage (12/08/2016), Left facial numbness (07/02/2012), Sepsis ([place]) (12/08/2016), Shortness of breath (12/02/2017), Stroke [facility]), Substance abuse ([place]) (12/08/2016), Systemic vasculitis  syndrome ([place]) (12/08/2016), Tobacco abuse (12/08/2016), Type 2 diabetes mellitus without complication, with long-term current use of insulin ([place]) (02/11/2017), and Wegener's granulomatosis with renal involvement ([place]) (12/08/2016).   reports that she has been smoking cigarettes. She started smoking about 50 years ago. She has a 9.80 pack-year smoking history. She has never used smokeless tobacco.  Past Surgical History:  Procedure Laterality Date  . ABDOMINAL HYSTERECTOMY  1985    No Known Allergies  Immunization History  Administered Date(s) Administered  . Influenza Split 07/02/2012  . Influenza,inj,Quad PF,6+ Mos 05/03/2017, 05/13/2018  . Pneumococcal Polysaccharide-23 07/02/2012, 11/22/2017    Family History  Problem Relation Age of Onset  . Cancer Father   . Alcohol abuse Father   . Diabetes Father   . Hypertension Father   . Heart attack Mother   . Diabetes Mother   . Hypertension Mother   . Stroke Mother   . Breast cancer Neg Hx   . Colon cancer Neg Hx   . Esophageal cancer Neg Hx   . Rectal cancer Neg Hx   . Stomach cancer Neg Hx   . Pancreatic cancer Neg Hx      Current Outpatient Medications:  .  ACCU-CHEK AVIVA PLUS test strip, USE TO CHECK BLOOD SUGAR IN THE MORNING AND IN THE EVENING, Disp: 100 each, Rfl: 4 .  albuterol (PROVENTIL HFA;VENTOLIN HFA) 108 (90 Base) MCG/ACT inhaler, INHALE 2 PUFFS EVERY 4-6 HOURS, Disp: 6.7 Inhaler, Rfl: 1 .  albuterol (PROVENTIL) (2.5 MG/3ML) 0.083% nebulizer solution, Take 3 mLs (2.5 mg total) by nebulization every 6 (six) hours as needed for wheezing or shortness of breath., Disp: 150 mL, Rfl: 1 .  aspirin 81 MG EC tablet, TAKE 1 TABLET (81 MG TOTAL) BY MOUTH DAILY. SWALLOW WHOLE., Disp: 90 tablet, Rfl: 3 .  ASPIRIN 81 PO, Take 1 tablet by mouth daily., Disp: , Rfl:  .  B-D UF III MINI PEN NEEDLES 31G X 5 MM MISC, 1 UNITS BY DOES NOT  APPLY ROUTE 3 (THREE) TIMES DAILY., Disp: 100 each, Rfl: 2 .  Blood Glucose Monitoring Suppl  (ACCU-CHEK AVIVA PLUS) w/Device KIT, Check blood sugar as instructed, Disp: 1 kit, Rfl: 0 .  hydrALAZINE (APRESOLINE) 100 MG tablet, Take 100 mg by mouth 3 (three) times daily., Disp: , Rfl:  .  Insulin Glargine (LANTUS SOLOSTAR) 100 UNIT/ML Solostar Pen, Inject 5 Units into the skin daily at 10 pm., Disp: 5 pen, Rfl: PRN .  labetalol (NORMODYNE) 300 MG tablet, Take 300 mg by mouth 2 (two) times daily., Disp: , Rfl:  .  linaclotide (LINZESS) 72 MCG capsule, Take 1 capsule (72 mcg total) by mouth daily before breakfast., Disp: 30 capsule, Rfl: 0 .  MICROLET LANCETS MISC, Use as directed to test three times a day. ICD-10 code: E11.9.  Please use Counselling psychologist., Disp: 100 each, Rfl: 3 .  pravastatin (PRAVACHOL) 40 MG tablet, TAKE 1 TABLET BY MOUTH AT BEDTIME FOR CHOLESTEROL, Disp: 90 tablet, Rfl: 3 .  sodium bicarbonate 650 MG tablet, Take 1,300 mg by mouth 2 (two) times daily., Disp: , Rfl:  .  sulfamethoxazole-trimethoprim (BACTRIM DS) 800-160 MG tablet, TAKE 1 TABLET BY MOUTH THREE TIMES A WEEK, Disp: 12 tablet, Rfl: 3      Objective:   Vitals:   05/07/19 0851  BP: 130/72  Pulse: 64  Temp: 97.9 F (36.6 C)  SpO2: 96%  Weight: 219 lb 9.6 oz (99.6 kg)  Height: '5\' 7"'  (1.702 m)    Estimated body mass index is 34.39 kg/m as calculated from the following:   Height as of this encounter: '5\' 7"'  (1.702 m).   Weight as of this encounter: 219 lb 9.6 oz (99.6 kg).  '@WEIGHTCHANGE' @  [employer]   05/07/19 0851  Weight: 219 lb 9.6 oz (99.6 kg)     Physical Exam  General Appearance:    Alert, cooperative, no distress, appears stated age - yea , Deconditioned looking - n , OBESE  - yes, Sitting on Wheelchair -  no  Head:    Normocephalic, without obvious abnormality, atraumatic  Eyes:    PERRL, conjunctiva/corneas clear,  Ears:    Normal TM's and external ear canals, both ears  Nose:   Nares normal, septum midline, mucosa normal, no drainage    or sinus tenderness. OXYGEN  ON  - no . Patient is @ ra   Throat:   Lips, mucosa, and tongue normal; teeth and gums normal. Cyanosis on lips - no  Neck:   Supple, symmetrical, trachea midline, no adenopathy;    thyroid:  no enlargement/tenderness/nodules; no carotid   bruit or JVD  Back:     Symmetric, no curvature, ROM normal, no CVA tenderness  Lungs:     Distress - no , Wheeze no, Barrell Chest - no, Purse lip breathing - no, Crackles - no   Chest Wall:    No tenderness or deformity.    Heart:    Regular rate and rhythm, S1 and S2 normal, no rub   or gallop, Murmur - no  Breast Exam:    NOT DONE  Abdomen:     Soft, non-tender, bowel sounds active all four quadrants,    no masses, no organomegaly. Visceral obesity - yes  Genitalia:   NOT DONE  Rectal:   NOT DONE  Extremities:   Extremities - normal, Has Cane - no, Clubbing - no, Edema - no  Pulses:   2+ and symmetric all extremities  Skin:   Stigmata  of Connective Tissue Disease - no  Lymph nodes:   Cervical, supraclavicular, and axillary nodes normal  Psychiatric:  Neurologic:   Pleasant - yes, Anxious - no, Flat affect - no  CAm-ICU - neg, Alert and Oriented x 3 - yes, Moves all 4s - yes, Speech - normal, Cognition - intact           Assessment:       ICD-10-CM   1. Systemic vasculitis syndrome ([place])  M31.8   2. High risk medication use  Z79.899   3. Encounter for therapeutic drug level monitoring  Z51.81   4. Smoking  F17.200   5. Pulmonary emphysema, unspecified emphysema type ([place])  J43.9        Plan:     Patient Instructions     ICD-10-CM   1. Systemic vasculitis syndrome ([place])  M31.8   2. High risk medication use  Z79.899   3. Encounter for therapeutic drug level monitoring  Z51.81   4. Smoking  F17.200   5. Pulmonary emphysema, unspecified emphysema type ([place])  J43.9      Systemic vasculitis syndrome (HCC) High risk medication use Encounter for therapeutic drug level monitoring  - clinically vasculitis in remission - check cbc  and bmet 05/07/2019 -check cxr  - chck urine analysis  - will hold off b cell flow cuytometry 05/07/2019 (done at [place])   Smoking - please quit asap  Pulmonary emphysema, unspecified emphysema type ([place]) - shortness of breath could be due to this  - Please start spiriva 1 puff daily - take sample, script and show technique   Followup 6 months or sooner if needed - 30 min followup     SIGNATURE    Dr. Brand Males, M.D., F.C.C.P,  Pulmonary and Critical Care Medicine Staff Physician, [place] Director - Interstitial Lung Disease  Program  Pulmonary [place] at [place], [place], [address]  Pager: [phone], If no answer or between  15:00h - 7:00h: call [phone] Telephone: [phone]  9:29 AM 05/07/2019

## 2019-05-07 NOTE — Patient Instructions (Addendum)
ICD-10-CM   1. Systemic vasculitis syndrome ([place])  M31.8   2. High risk medication use  Z79.899   3. Encounter for therapeutic drug level monitoring  Z51.81   4. Smoking  F17.200   5. Pulmonary emphysema, unspecified emphysema type ([place])  J43.9      Systemic vasculitis syndrome ([place]) High risk medication use Encounter for therapeutic drug level monitoring  - clinically vasculitis in remission - check cbc and bmet 05/07/2019 -check cxr  - chck urine analysis  - will hold off b cell flow cuytometry 05/07/2019 (done at [facility])   Smoking - please quit asap  Pulmonary emphysema, unspecified emphysema type ([place]) - shortness of breath could be due to this  - Please start spiriva 1 puff daily - take sample, script and show technique   Followup 6 months or sooner if needed - 30 min followup

## 2019-05-11 ENCOUNTER — Telehealth: Payer: Self-pay | Admitting: Internal Medicine

## 2019-05-11 DIAGNOSIS — M318 Other specified necrotizing vasculopathies: Secondary | ICD-10-CM

## 2019-05-11 NOTE — Telephone Encounter (Signed)
Call made to [facility] long lab, unable to locate what order I need to place for cell flow cytometry. I also made them aware I am trying to place labs for patient and I need a fax number to send labs. Fax [phone].

## 2019-05-11 NOTE — Telephone Encounter (Signed)
Routing message to RN to hold to address tomorrow.

## 2019-05-11 NOTE — Telephone Encounter (Signed)
Let Catherine Aguilar know that blood labs look ok and creatinine aka kindey function has improved with time. However, urine analysis shows red cells - and I am worried this is either vasculitis or urine infection  Plan  - send midstream urine culture - test blood P-ANCA and MPO  - send blood cell flow cytometry (for rituxan use) blood work at [place] ([employer] at our office knows how to get this done)             - Dr Gari Crown or Dr Thressa Sheller attention   Will call with results to decide next step  Thanks    SIGNATURE    Dr. Brand Males, M.D., F.C.C.P,  Pulmonary and Critical Care Medicine Staff Physician, [place] Director - Interstitial Lung Disease  Program  Pulmonary [place] at [place], [place], [address]  Pager: [phone], If no answer or between  15:00h - 7:00h: call [phone] Telephone: [phone]  10:11 AM 05/11/2019

## 2019-05-12 ENCOUNTER — Other Ambulatory Visit: Payer: Self-pay

## 2019-05-12 MED ORDER — LINACLOTIDE 72 MCG PO CAPS: 72.0000 ug | ORAL_CAPSULE | Freq: Every day | ORAL | 0 refills | Status: DC

## 2019-05-12 NOTE — Telephone Encounter (Signed)
Called flow cytometry lab at [phone] to make sure it can be couried over and the same form can be used. I spoke with Estill Bamberg and was advised we can use an Epic lab now, 'immunophenotyping by flow cytometry'. Estill Bamberg requested to know what antibodies to look for and specifics about the pt's diagnosis. I spoke with MR and was advised to forward the questions to Dr. Thressa Sheller, I have emailed her and copied Burman Nieves, Supervisor. Will await to hear back before placing order. After order is placed will then need to call pt to have purple top lab drawn here and couried over to [facility] lab with the printed req form in the bio bag.   Will hold in my box to follow up on.

## 2019-05-15 ENCOUNTER — Other Ambulatory Visit: Payer: Self-pay

## 2019-05-15 MED ORDER — PRAVASTATIN SODIUM 40 MG PO TABS: ORAL_TABLET | ORAL | 3 refills | Status: DC

## 2019-05-26 ENCOUNTER — Other Ambulatory Visit (HOSPITAL_COMMUNITY)
Admission: RE | Admit: 2019-05-26 | Discharge: 2019-05-26 | Disposition: A | Discharge status: Home / Self Care | Payer: Medicaid Other | Source: Ambulatory Visit | Attending: Internal Medicine | Admitting: Internal Medicine

## 2019-05-26 DIAGNOSIS — M318 Other specified necrotizing vasculopathies: Secondary | ICD-10-CM | POA: Insufficient documentation

## 2019-05-26 NOTE — Telephone Encounter (Addendum)
Within the Epic order there is no available 'resulting agency' for the lab. Called and spoke to [place] with Flow Cytometry lab and was advised to send the form like before. From completed for B-cell flow cytometry and lab orders placed. Informed Irma, phlebotomist, about the flow cytometry (purple top tube) needing couried to [facility], sample can stay at room temp.   Called and spoke to pt. She is aware to come by for labs this week. Will keep message open to follow up. Will send to MR as FYI.

## 2019-05-27 ENCOUNTER — Other Ambulatory Visit (INDEPENDENT_AMBULATORY_CARE_PROVIDER_SITE_OTHER): Payer: Medicaid Other

## 2019-05-27 DIAGNOSIS — M318 Other specified necrotizing vasculopathies: Secondary | ICD-10-CM

## 2019-05-27 LAB — BASIC METABOLIC PANEL
BUN: 59 mg/dL — ABNORMAL HIGH (ref 6–23)
CO2: 21 mEq/L (ref 19–32)
Calcium: 9.9 mg/dL (ref 8.4–10.5)
Chloride: 106 mEq/L (ref 96–112)
Creatinine, Ser: 3.29 mg/dL — ABNORMAL HIGH (ref 0.40–1.20)
GFR: 17.08 mL/min — ABNORMAL LOW (ref >60.00)
Glucose, Bld: 225 mg/dL — ABNORMAL HIGH (ref 70–99)
Potassium: 4.3 mEq/L (ref 3.5–5.1)
Sodium: 137 mEq/L (ref 135–145)

## 2019-05-27 LAB — URINALYSIS
Bilirubin Urine: NEGATIVE
Hgb urine dipstick: NEGATIVE
Ketones, ur: NEGATIVE
Leukocytes,Ua: NEGATIVE
Nitrite: NEGATIVE
Specific Gravity, Urine: 1.015 (ref 1.000–1.030)
Urine Glucose: NEGATIVE
Urobilinogen, UA: 0.2 (ref 0.0–1.0)
pH: 6 (ref 5.0–8.0)

## 2019-05-27 LAB — CBC WITH DIFFERENTIAL/PLATELET
Basophils Absolute: 0 10*3/uL (ref 0.0–0.1)
Basophils Relative: 0.4 % (ref 0.0–3.0)
Eosinophils Absolute: 0.1 10*3/uL (ref 0.0–0.7)
Eosinophils Relative: 1.2 % (ref 0.0–5.0)
HCT: 36.3 % (ref 36.0–46.0)
Hemoglobin: 11.5 g/dL — ABNORMAL LOW (ref 12.0–15.0)
Lymphocytes Relative: 23.5 % (ref 12.0–46.0)
Lymphs Abs: 1.5 10*3/uL (ref 0.7–4.0)
MCHC: 31.7 g/dL (ref 30.0–36.0)
MCV: 82.4 fl (ref 78.0–100.0)
Monocytes Absolute: 0.5 10*3/uL (ref 0.1–1.0)
Monocytes Relative: 8 % (ref 3.0–12.0)
Neutro Abs: 4.2 10*3/uL (ref 1.4–7.7)
Neutrophils Relative %: 66.9 % (ref 43.0–77.0)
Platelets: 204 10*3/uL (ref 150.0–400.0)
RBC: 4.41 Mil/uL (ref 3.87–5.11)
RDW: 16.2 % — ABNORMAL HIGH (ref 11.5–15.5)
WBC: 6.2 10*3/uL (ref 4.0–10.5)

## 2019-05-27 LAB — SURGICAL PATHOLOGY

## 2019-05-27 NOTE — Telephone Encounter (Signed)
Thank You Daneil Dan. Also is it possible to write this worklow ans send to my cone email s0 then we can memorialize it and make it operational. I can also share wiwht other docs

## 2019-05-28 ENCOUNTER — Other Ambulatory Visit: Payer: Self-pay

## 2019-05-28 ENCOUNTER — Ambulatory Visit (HOSPITAL_COMMUNITY)
Admission: RE | Admit: 2019-05-28 | Discharge: 2019-05-28 | Disposition: A | Discharge status: Home / Self Care | Payer: Medicaid Other | Source: Ambulatory Visit | Attending: Nephrology | Admitting: Nephrology

## 2019-05-28 VITALS — BP 138/62 | HR 64 | Temp 97.5°F | Resp 18

## 2019-05-28 DIAGNOSIS — N170 Acute kidney failure with tubular necrosis: Secondary | ICD-10-CM | POA: Diagnosis present

## 2019-05-28 LAB — FERRITIN: Ferritin: 179 ng/mL (ref 11–307)

## 2019-05-28 LAB — IRON AND TIBC
Iron: 72 ug/dL (ref 28–170)
Saturation Ratios: 22 % (ref 10.4–31.8)
TIBC: 329 ug/dL (ref 250–450)
UIBC: 257 ug/dL

## 2019-05-28 LAB — POCT HEMOGLOBIN-HEMACUE: Hemoglobin: 12.3 g/dL (ref 12.0–15.0)

## 2019-05-28 MED ORDER — DARBEPOETIN ALFA 150 MCG/0.3ML IJ SOSY: 150.0000 ug | PREFILLED_SYRINGE | INTRAMUSCULAR | Status: DC

## 2019-05-29 LAB — ANCA SCREEN W REFLEX TITER
ANCA Screen: POSITIVE — AB
P-ANCA Titer: 1:320 {titer} — ABNORMAL HIGH

## 2019-05-29 LAB — MPO/PR-3 (ANCA) ANTIBODIES
Myeloperoxidase Abs: 2.3 AI — ABNORMAL HIGH
Serine Protease 3: <1 AI

## 2019-06-06 ENCOUNTER — Other Ambulatory Visit: Payer: Self-pay | Admitting: Internal Medicine

## 2019-06-06 ENCOUNTER — Other Ambulatory Visit: Payer: Self-pay | Admitting: Family Medicine

## 2019-06-09 ENCOUNTER — Other Ambulatory Visit: Payer: Self-pay | Admitting: Internal Medicine

## 2019-06-09 ENCOUNTER — Other Ambulatory Visit: Payer: Self-pay | Admitting: Family Medicine

## 2019-06-09 DIAGNOSIS — Z1231 Encounter for screening mammogram for malignant neoplasm of breast: Secondary | ICD-10-CM

## 2019-06-12 ENCOUNTER — Encounter (HOSPITAL_COMMUNITY): Payer: Medicaid Other

## 2019-06-18 ENCOUNTER — Encounter (HOSPITAL_COMMUNITY): Payer: Medicaid Other

## 2019-06-19 ENCOUNTER — Encounter (HOSPITAL_COMMUNITY)
Admission: RE | Admit: 2019-06-19 | Discharge: 2019-06-19 | Disposition: A | Discharge status: Home / Self Care | Payer: Medicaid Other | Source: Ambulatory Visit | Attending: Nephrology | Admitting: Nephrology

## 2019-06-19 ENCOUNTER — Encounter (HOSPITAL_COMMUNITY): Payer: Medicaid Other

## 2019-06-19 ENCOUNTER — Other Ambulatory Visit: Payer: Self-pay

## 2019-06-19 VITALS — BP 160/80 | HR 62 | Temp 98.2°F | Resp 18

## 2019-06-19 DIAGNOSIS — N170 Acute kidney failure with tubular necrosis: Secondary | ICD-10-CM | POA: Insufficient documentation

## 2019-06-19 LAB — IRON AND TIBC
Iron: 62 ug/dL (ref 28–170)
Saturation Ratios: 18 % (ref 10.4–31.8)
TIBC: 339 ug/dL (ref 250–450)
UIBC: 277 ug/dL

## 2019-06-19 LAB — POCT HEMOGLOBIN-HEMACUE: Hemoglobin: 12.2 g/dL (ref 12.0–15.0)

## 2019-06-19 LAB — FERRITIN: Ferritin: 144 ng/mL (ref 11–307)

## 2019-06-19 MED ORDER — DARBEPOETIN ALFA 150 MCG/0.3ML IJ SOSY: 150.0000 ug | PREFILLED_SYRINGE | INTRAMUSCULAR | Status: DC

## 2019-07-02 ENCOUNTER — Other Ambulatory Visit (HOSPITAL_COMMUNITY): Payer: Self-pay | Admitting: *Deleted

## 2019-07-03 ENCOUNTER — Other Ambulatory Visit: Payer: Self-pay

## 2019-07-03 ENCOUNTER — Ambulatory Visit (HOSPITAL_COMMUNITY)
Admission: RE | Admit: 2019-07-03 | Discharge: 2019-07-03 | Disposition: A | Discharge status: Home / Self Care | Payer: Medicaid Other | Source: Ambulatory Visit | Attending: Nephrology | Admitting: Nephrology

## 2019-07-03 VITALS — BP 129/89 | HR 61 | Temp 97.5°F | Resp 18

## 2019-07-03 DIAGNOSIS — N170 Acute kidney failure with tubular necrosis: Secondary | ICD-10-CM | POA: Insufficient documentation

## 2019-07-03 LAB — POCT HEMOGLOBIN-HEMACUE: Hemoglobin: 12.1 g/dL (ref 12.0–15.0)

## 2019-07-03 MED ORDER — DARBEPOETIN ALFA 150 MCG/0.3ML IJ SOSY: 150.0000 ug | PREFILLED_SYRINGE | INTRAMUSCULAR | Status: DC

## 2019-07-10 ENCOUNTER — Other Ambulatory Visit: Payer: Self-pay

## 2019-07-11 MED ORDER — ASPIRIN 81 MG PO TBEC: 81.0000 mg | DELAYED_RELEASE_TABLET | Freq: Every day | ORAL | 12 refills | Status: DC

## 2019-07-17 ENCOUNTER — Encounter (HOSPITAL_COMMUNITY): Payer: Medicaid Other

## 2019-07-27 ENCOUNTER — Ambulatory Visit
Admission: RE | Admit: 2019-07-27 | Discharge: 2019-07-27 | Disposition: A | Discharge status: Home / Self Care | Payer: Medicaid Other | Source: Ambulatory Visit | Attending: Internal Medicine | Admitting: Internal Medicine

## 2019-07-27 ENCOUNTER — Other Ambulatory Visit: Payer: Self-pay

## 2019-07-27 DIAGNOSIS — Z1231 Encounter for screening mammogram for malignant neoplasm of breast: Secondary | ICD-10-CM

## 2019-07-31 ENCOUNTER — Ambulatory Visit (HOSPITAL_COMMUNITY)
Admission: RE | Admit: 2019-07-31 | Discharge: 2019-07-31 | Disposition: A | Discharge status: Home / Self Care | Payer: Medicaid Other | Source: Ambulatory Visit | Attending: Nephrology | Admitting: Nephrology

## 2019-07-31 ENCOUNTER — Other Ambulatory Visit: Payer: Self-pay

## 2019-07-31 DIAGNOSIS — N170 Acute kidney failure with tubular necrosis: Secondary | ICD-10-CM | POA: Diagnosis not present

## 2019-07-31 LAB — IRON AND TIBC
Iron: 61 ug/dL (ref 28–170)
Saturation Ratios: 19 % (ref 10.4–31.8)
TIBC: 315 ug/dL (ref 250–450)
UIBC: 254 ug/dL

## 2019-07-31 LAB — POCT HEMOGLOBIN-HEMACUE: Hemoglobin: 12.1 g/dL (ref 12.0–15.0)

## 2019-07-31 LAB — FERRITIN: Ferritin: 142 ng/mL (ref 11–307)

## 2019-07-31 MED ORDER — DARBEPOETIN ALFA 150 MCG/0.3ML IJ SOSY: 150.0000 ug | PREFILLED_SYRINGE | INTRAMUSCULAR | Status: DC

## 2019-08-14 ENCOUNTER — Encounter (HOSPITAL_COMMUNITY): Payer: Medicaid Other

## 2019-08-16 ENCOUNTER — Telehealth: Payer: Self-pay | Admitting: Internal Medicine

## 2019-08-16 DIAGNOSIS — N170 Acute kidney failure with tubular necrosis: Secondary | ICD-10-CM

## 2019-08-16 NOTE — Telephone Encounter (Signed)
Noticed that her creat was rising in sept when checked by renal   Please  Ask her when last bmet check was. If none since sept 2020  -lease have her do a bmet and UA anytime dec 2020 in our office

## 2019-08-17 NOTE — Telephone Encounter (Signed)
LMTCB and will forward to [facility] to f/u on

## 2019-08-18 ENCOUNTER — Other Ambulatory Visit: Payer: Self-pay

## 2019-08-18 ENCOUNTER — Encounter (HOSPITAL_COMMUNITY)
Admission: RE | Admit: 2019-08-18 | Discharge: 2019-08-18 | Disposition: A | Discharge status: Home / Self Care | Payer: Medicaid Other | Source: Ambulatory Visit | Attending: Nephrology | Admitting: Nephrology

## 2019-08-18 VITALS — BP 148/75 | HR 57 | Temp 97.2°F | Resp 18

## 2019-08-18 DIAGNOSIS — N170 Acute kidney failure with tubular necrosis: Secondary | ICD-10-CM | POA: Insufficient documentation

## 2019-08-18 LAB — POCT HEMOGLOBIN-HEMACUE: Hemoglobin: 11.8 g/dL — ABNORMAL LOW (ref 12.0–15.0)

## 2019-08-18 MED ORDER — DARBEPOETIN ALFA 150 MCG/0.3ML IJ SOSY
PREFILLED_SYRINGE | INTRAMUSCULAR | Status: AC
  Filled 2019-08-18: qty 0.3

## 2019-08-18 MED ORDER — DARBEPOETIN ALFA 150 MCG/0.3ML IJ SOSY
150.0000 ug | PREFILLED_SYRINGE | INTRAMUSCULAR | Status: DC
  Administered 2019-08-18: 150 ug via SUBCUTANEOUS

## 2019-08-18 NOTE — Telephone Encounter (Signed)
Called and spoke with pt in regards to the info stated by MR. Pt believes last time it had been repeated was when we did it last in Sept. Stated to her that MR wants her to have bmet and ua repeated and she verbalized understanding.  Orders placed for bmet and ua. Nothing further needed.

## 2019-08-19 ENCOUNTER — Other Ambulatory Visit: Payer: Self-pay | Admitting: *Deleted

## 2019-08-19 ENCOUNTER — Other Ambulatory Visit (INDEPENDENT_AMBULATORY_CARE_PROVIDER_SITE_OTHER): Payer: Medicaid Other

## 2019-08-19 DIAGNOSIS — M318 Other specified necrotizing vasculopathies: Secondary | ICD-10-CM

## 2019-08-19 DIAGNOSIS — N170 Acute kidney failure with tubular necrosis: Secondary | ICD-10-CM

## 2019-08-19 LAB — BASIC METABOLIC PANEL
BUN: 37 mg/dL — ABNORMAL HIGH (ref 6–23)
CO2: 21 mEq/L (ref 19–32)
Calcium: 9.4 mg/dL (ref 8.4–10.5)
Chloride: 107 mEq/L (ref 96–112)
Creatinine, Ser: 2.4 mg/dL — ABNORMAL HIGH (ref 0.40–1.20)
GFR: 24.56 mL/min — ABNORMAL LOW (ref >60.00)
Glucose, Bld: 194 mg/dL — ABNORMAL HIGH (ref 70–99)
Potassium: 4.3 mEq/L (ref 3.5–5.1)
Sodium: 137 mEq/L (ref 135–145)

## 2019-08-19 LAB — URINALYSIS
Bilirubin Urine: NEGATIVE
Hgb urine dipstick: NEGATIVE
Ketones, ur: NEGATIVE
Leukocytes,Ua: NEGATIVE
Nitrite: NEGATIVE
Specific Gravity, Urine: 1.02 (ref 1.000–1.030)
Total Protein, Urine: NEGATIVE
Urine Glucose: NEGATIVE
Urobilinogen, UA: 0.2 (ref 0.0–1.0)
pH: 6 (ref 5.0–8.0)

## 2019-08-19 NOTE — Addendum Note (Signed)
Addended by: Suzzanne Cloud E on: 08/19/2019 02:39 PM   Modules accepted: Orders

## 2019-08-20 ENCOUNTER — Telehealth: Payer: Self-pay | Admitting: Internal Medicine

## 2019-08-20 NOTE — Telephone Encounter (Signed)
Called and spoke with pt letting her know the results of labwork and that we needed to see her in office around [facility] 2021. Pt verbalized understanding. Recall has been placed. Nothing further needed.

## 2019-08-20 NOTE — Telephone Encounter (Signed)
   UA normal Creat better  Pl;an  - no rituxan now   - return to clinic in 3 months or sooner if needed or 6 months from end of august visit is fine - this is end feb 2021 or early march 2021   LABS    PULMONARY No results for input(s): PHART, PCO2ART, PO2ART, HCO3, TCO2, O2SAT in the last 168 hours.  Invalid input(s): PCO2, PO2  CBC Recent Labs  Lab 08/18/19 0931  HGB 11.8*    COAGULATION No results for input(s): INR in the last 168 hours.  CARDIAC  No results for input(s): TROPONINI in the last 168 hours. No results for input(s): PROBNP in the last 168 hours.   CHEMISTRY Recent Labs  Lab 08/19/19 1517  NA 137  K 4.3  CL 107  CO2 21  GLUCOSE 194*  BUN 37*  CREATININE 2.40*  CALCIUM 9.4   Estimated Creatinine Clearance: 28.7 mL/min (A) (by C-G formula based on SCr of 2.4 mg/dL (H)).   LIVER No results for input(s): AST, ALT, ALKPHOS, BILITOT, PROT, ALBUMIN, INR in the last 168 hours.   INFECTIOUS No results for input(s): LATICACIDVEN, PROCALCITON in the last 168 hours.   ENDOCRINE CBG (last 3)  No results for input(s): GLUCAP in the last 72 hours.       IMAGING x48h  - image(s) personally visualized  -   highlighted in bold No results found.

## 2019-08-28 ENCOUNTER — Encounter (HOSPITAL_COMMUNITY): Payer: Medicaid Other

## 2019-09-06 ENCOUNTER — Other Ambulatory Visit: Payer: Self-pay | Admitting: Family Medicine

## 2019-09-07 ENCOUNTER — Telehealth: Payer: Self-pay | Admitting: Internal Medicine

## 2019-09-07 MED ORDER — DOXYCYCLINE HYCLATE 100 MG PO TABS: 100.0000 mg | ORAL_TABLET | Freq: Two times a day (BID) | ORAL | 0 refills | Status: DC

## 2019-09-07 MED ORDER — PREDNISONE 10 MG PO TABS: ORAL_TABLET | ORAL | 0 refills | Status: DC

## 2019-09-07 NOTE — Telephone Encounter (Signed)
Spoke with pt, aware of recs.  rx's sent to pharmacy.  Nothing further needed at this time- will close encounter.   

## 2019-09-07 NOTE — Telephone Encounter (Signed)
She can try a short course of prednisone - Please take prednisone 40 mg x1 day, then 30 mg x1 day, then 20 mg x1 day, then 10 mg x1 day, and then 5 mg x1 day and stop  +/- abx - abx if sinus drainage is discolored - Take doxycycline 100mg  po twice daily x 5 days; take after meals and avoid sunlight  But we need to keep an eye because she is known to have vasculitis and the sinus issue can be vasculitis though her creat has come down which is good news and her UA in dec was normal. Also good news. So I thik her sinus is likely cold weather related  Keep aptt with Volanda Napoleon 09/14/2019     SIGNATURE    Dr. Brand Males, M.D., F.C.C.P,  Pulmonary and Critical Care Medicine Staff Physician, [place] Director - Interstitial Lung Disease  Program  Pulmonary [place] at [place], [place], [address]  Pager: [phone], If no answer or between  15:00h - 7:00h: call [phone] Telephone: [phone]  10:15 AM 09/07/2019      Results for Catherine Aguilar, Catherine Aguilar (MRN [id number]) as of 09/07/2019 10:14  Ref. Range 02/12/2019 11:00 04/02/2019 12:07 05/07/2019 10:01 05/27/2019 08:46 05/28/2019 13:09 06/19/2019 12:21 07/31/2019 09:54 08/19/2019 15:17  Creatinine Latest Ref Range: 0.40 - 1.20 mg/dL   2.66 (H) 3.29 (H)    2.40 (H)    No Known Allergies

## 2019-09-07 NOTE — Telephone Encounter (Signed)
Spoke with pt, c/o L nostril stuffiness, sinus congestion Xseveral days.  Unable to produce mucus from her sinuses when blowing nose.  States nose is only stuffy when in her home, and when she steps outside it clears up quickly.  Pt states she feels like she has a sinus infection but this has not been diagnosed by anyone.  Denies fever, chills, sob aside from not being able to breathe properly through nose, chest pain.   Using saline nasal spray, otc decongestant (could not tell me the name of the exact brand).  Pt would like additional recs.    [facility] [address].   MR please advise on recs.  Thanks!

## 2019-09-14 ENCOUNTER — Ambulatory Visit: Payer: Medicaid Other | Admitting: Primary Care

## 2019-09-14 ENCOUNTER — Other Ambulatory Visit: Payer: Self-pay

## 2019-09-14 ENCOUNTER — Encounter: Payer: Self-pay | Admitting: Primary Care

## 2019-09-14 DIAGNOSIS — J449 Chronic obstructive pulmonary disease, unspecified: Secondary | ICD-10-CM

## 2019-09-14 DIAGNOSIS — J0100 Acute maxillary sinusitis, unspecified: Secondary | ICD-10-CM | POA: Insufficient documentation

## 2019-09-14 MED ORDER — STIOLTO RESPIMAT 2.5-2.5 MCG/ACT IN AERS: 2.0000 | INHALATION_SPRAY | Freq: Every day | RESPIRATORY_TRACT | 0 refills | Status: DC

## 2019-09-14 MED ORDER — FLUTICASONE PROPIONATE 50 MCG/ACT NA SUSP: 2.0000 | Freq: Every day | NASAL | 2 refills | Status: AC

## 2019-09-14 MED ORDER — PREDNISONE 10 MG PO TABS: ORAL_TABLET | ORAL | 0 refills | Status: DC

## 2019-09-14 MED ORDER — DOXYCYCLINE HYCLATE 100 MG PO TABS: 100.0000 mg | ORAL_TABLET | Freq: Two times a day (BID) | ORAL | 0 refills | Status: DC

## 2019-09-14 NOTE — Progress Notes (Signed)
_0  ID: Catherine Aguilar, female    DOB: 1955-11-08, 64 y.o.   MRN: [id number]  Chief Complaint  Patient presents with  . Follow-up    Referring provider: Daisy Floro, DO  HPI: 64 year old female, current smoker. PMH significant for asthma with COPD, type 2 diabetes, HTN, wegener granulomatosis with renal involvement, systemic vasculitis syndrome, cutaneous vasculitis, CKD, HLD, substance abuse. Patient of Dr. Chase Caller, last seen  05/07/19. Most recent creatinine in December improved at 2.4 and UA normal. Eosinophil absolute 100.   09/14/2019 Patient presents today for follow-up visit. Called office on 09/07/19 with acute symptoms of nasal congestion, states that she was unable to breath out one side of her nose.She has been using saline nasal spray with no improvement and over the counter decongestant. She was given a short course of prednisone and prescribed doxycycline to take if drainage is discolored. She feels significantly better. Mucus is clear, occasionally yellow/green tinge. No blood. Experiences dyspnea with walking. Associated wheezing. She is asking about another inhaler such as Trelegy. Uses her rescue inhaler every time she goes outside, states that she needs to sit down to catch her breath. Uses Albuterol hfa approx 3-5 times a day. Denies fever, chills, shortness of breath at rest, no chest tightness, headache, vision change, sinus pain.    No Known Allergies  Immunization History  Administered Date(s) Administered  . Influenza Split 07/02/2012  . Influenza,inj,Quad PF,6+ Mos 05/03/2017, 05/13/2018  . Pneumococcal Polysaccharide-23 07/02/2012, 11/22/2017    Past Medical History:  Diagnosis Date  . Acute renal failure (ARF) ([place]) 12/08/2016  . Acute respiratory failure with hypoxia ([place]) 12/08/2016  . AKI (acute kidney injury) ([place]) 12/08/2016  . Allergy   . Anemia   . Asthma with COPD (chronic obstructive pulmonary disease) ([place]) 11/25/2017  . CAP  (community acquired pneumonia) 12/08/2016  . Chronic kidney disease   . CKD (chronic kidney disease), stage IV ([place]) 02/11/2017  . 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 CVA (cerebral vascular accident) ([place]) 07/03/2012  . Diffuse pulmonary alveolar hemorrhage   . Edema of right lower extremity 03/25/2017  . Essential hypertension 12/08/2016  . Hemoptysis 12/08/2016  . High cholesterol   . HLD (hyperlipidemia) 12/08/2016  . Hx of adenomatous polyp of colon 04/16/2017  . Hypertension    stopped taking medications 10 years ago  . Hypokalemia 07/02/2012  . Intra-alveolar hemorrhage 12/08/2016  . Left facial numbness 07/02/2012  . Sepsis ([place]) 12/08/2016  . Shortness of breath 12/02/2017  . Stroke ([place])   . Substance abuse ([place]) 12/08/2016  . Systemic vasculitis syndrome ([place]) 12/08/2016  . Tobacco abuse 12/08/2016  . Type 2 diabetes mellitus without complication, with long-term current use of insulin ([place]) 02/11/2017  . Wegener's granulomatosis with renal involvement ([place]) 12/08/2016    Tobacco History: Social History   Tobacco Use  Smoking Status Current Every Day Smoker  . Packs/day: 0.20  . Years: 49.00  . Pack years: 9.80  . Types: Cigarettes  . Start date: 09/10/1968  Smokeless Tobacco Never Used   Ready to quit: Not Answered Counseling given: Not Answered   Outpatient Medications Prior to Visit  Medication Sig Dispense Refill  . ACCU-CHEK AVIVA PLUS test strip USE TO CHECK BLOOD SUGAR IN THE MORNING AND IN THE EVENING 100 each 4  . albuterol (PROVENTIL HFA;VENTOLIN HFA) 108 (90 Base) MCG/ACT inhaler INHALE 2 PUFFS EVERY 4-6 HOURS 6.7 Inhaler 1  . albuterol (PROVENTIL) (2.5 MG/3ML) 0.083% nebulizer solution Take 3 mLs (2.5 mg total) by  nebulization every 6 (six) hours as needed for wheezing or shortness of breath. 150 mL 1  . aspirin (ASPIRIN 81) 81 MG EC tablet Take 1 tablet (81 mg total) by mouth daily. 30 tablet 12  . aspirin 81 MG EC tablet TAKE 1 TABLET (81 MG TOTAL) BY MOUTH DAILY.  SWALLOW WHOLE. 90 tablet 3  . Blood Glucose Monitoring Suppl (ACCU-CHEK AVIVA PLUS) w/Device KIT Check blood sugar as instructed 1 kit 0  . hydrALAZINE (APRESOLINE) 100 MG tablet Take 100 mg by mouth 3 (three) times daily.    . Insulin Glargine (LANTUS SOLOSTAR) 100 UNIT/ML Solostar Pen Inject 5 Units into the skin daily at 10 pm. 5 pen PRN  . labetalol (NORMODYNE) 300 MG tablet Take 300 mg by mouth 2 (two) times daily.    Marland Kitchen LINZESS 72 MCG capsule TAKE 1 CAPSULE (72 MCG TOTAL) BY MOUTH DAILY BEFORE BREAKFAST. 30 capsule 0  . MICROLET LANCETS MISC Use as directed to test three times a day. ICD-10 code: E11.9.  Please use Counselling psychologist. 100 each 3  . pravastatin (PRAVACHOL) 40 MG tablet TAKE 1 TABLET BY MOUTH AT BEDTIME FOR CHOLESTEROL 90 tablet 3  . sodium bicarbonate 650 MG tablet Take 1,300 mg by mouth 2 (two) times daily.    Marland Kitchen sulfamethoxazole-trimethoprim (BACTRIM DS) 800-160 MG tablet TAKE 1 TABLET BY MOUTH THREE TIMES A WEEK 12 tablet 3  . Tiotropium Bromide Monohydrate (SPIRIVA RESPIMAT) 1.25 MCG/ACT AERS Inhale 2 puffs into the lungs daily. 1 g 6  . Tiotropium Bromide Monohydrate (SPIRIVA RESPIMAT) 1.25 MCG/ACT AERS Inhale 2 puffs into the lungs daily. 1 g 0  . doxycycline (VIBRA-TABS) 100 MG tablet Take 1 tablet (100 mg total) by mouth 2 (two) times daily. 10 tablet 0  . predniSONE (DELTASONE) 10 MG tablet 4 tabs X1 day, 3 tabs X1 day, 2 tabs X1 day, 1 tab X1 day, 0.5 tab X1 day, then stop. 11 tablet 0   No facility-administered medications prior to visit.   Review of Systems  Review of Systems  Constitutional: Negative.   HENT: Positive for congestion, postnasal drip and sinus pressure. Negative for sinus pain.   Eyes: Negative for pain and visual disturbance.  Respiratory: Positive for wheezing. Negative for cough.        DOE  Cardiovascular: Negative.   Neurological: Negative for headaches.   Physical Exam  BP 128/70 (BP Location: Left Arm, Cuff Size: Large)    Pulse 71   Temp (!) 97.4 F (36.3 C) (Oral)   Ht _0  (1.702 m)   Wt 212 lb (96.2 kg)   SpO2 96%   BMI 33.20 kg/m  Physical Exam Constitutional:      Appearance: Normal appearance.  HENT:     Head: Normocephalic and atraumatic.     Nose: Congestion present.     Mouth/Throat:     Comments: Deferred d/t masking Cardiovascular:     Rate and Rhythm: Normal rate and regular rhythm.  Pulmonary:     Effort: Pulmonary effort is normal.     Breath sounds: Normal breath sounds.  Musculoskeletal:        General: Normal range of motion.  Skin:    General: Skin is warm and dry.  Neurological:     General: No focal deficit present.     Mental Status: She is alert and oriented to person, place, and time. Mental status is at baseline.  Psychiatric:        Mood and Affect: Mood  normal.        Behavior: Behavior normal.        Thought Content: Thought content normal.        Judgment: Judgment normal.      Lab Results:  CBC    Component Value Date/Time   WBC 6.2 05/27/2019 0846   RBC 4.41 05/27/2019 0846   HGB 11.8 (L) 08/18/2019 0931   HGB 6.6 (L) 12/08/2016 1520   HCT 36.3 05/27/2019 0846   PLT 204.0 05/27/2019 0846   MCV 82.4 05/27/2019 0846   MCH 26.7 09/19/2017 1100   MCHC 31.7 05/27/2019 0846   RDW 16.2 (H) 05/27/2019 0846   LYMPHSABS 1.5 05/27/2019 0846   MONOABS 0.5 05/27/2019 0846   EOSABS 0.1 05/27/2019 0846   BASOSABS 0.0 05/27/2019 0846    BMET    Component Value Date/Time   NA 137 08/19/2019 1517   K 4.3 08/19/2019 1517   CL 107 08/19/2019 1517   CO2 21 08/19/2019 1517   GLUCOSE 194 (H) 08/19/2019 1517   BUN 37 (H) 08/19/2019 1517   CREATININE 2.40 (H) 08/19/2019 1517   CALCIUM 9.4 08/19/2019 1517   GFRNONAA 15 (L) 09/02/2017 2149   GFRAA 17 (L) 09/02/2017 2149    BNP    Component Value Date/Time   BNP 27.6 09/02/2017 2149    ProBNP No results found for: PROBNP  Imaging: No results found.   Assessment & Plan:   Acute maxillary  sinusitis - 80% improvement with doxycycline and 5 day prednisone taper - Extending antibiotic additional three days and prednisone 48m x 5 days - Gave patient sample of netti pot and instructed to use twice daily - RX flonase nasal spray once daily - No signs of vasculitis- denies HA, vision changes or pain   Asthma with COPD (chronic obstructive pulmonary disease) ([place] - Continues to experiences dyspnea on exertion; freq use of SABA  - Change Spiriva to Stiolto respimat  - FU in 2-4 weeks to assess after trial new inhaler    EMartyn Ehrich NP 09/14/2019

## 2019-09-14 NOTE — Assessment & Plan Note (Signed)
-   80% improvement with doxycycline and 5 day prednisone taper - Extending antibiotic additional three days and prednisone 20mg  x 5 days - Gave patient sample of netti pot and instructed to use twice daily - RX flonase nasal spray once daily - No signs of vasculitis- denies HA, vision changes or pain

## 2019-09-14 NOTE — Assessment & Plan Note (Signed)
-   Continues to experiences dyspnea on exertion; freq use of SABA  - Change Spiriva to Stiolto respimat  - FU in 2-4 weeks to assess after trial new inhaler

## 2019-09-14 NOTE — Patient Instructions (Addendum)
Recommendations: - Stop Spiriva - Trial stiolto take two puffs once daily - Continue Albuterol rescue inhaler 2 puffs every 4-6 hours as needed for breakthrough shortness of breath or wheezing  - Netti pot saline nasal rinse twice a day as tolerates  - Then flonase nasal spray once a day  RX: Extended doxycycline 3 additional day and prednisone additional 5 days   Follow-up: 2-4 weeks with Eustaquio Maize NP or MR - office visit or virtual ok

## 2019-09-15 ENCOUNTER — Ambulatory Visit (HOSPITAL_COMMUNITY)
Admission: RE | Admit: 2019-09-15 | Discharge: 2019-09-15 | Disposition: A | Discharge status: Home / Self Care | Payer: Medicaid Other | Source: Ambulatory Visit | Attending: Nephrology | Admitting: Nephrology

## 2019-09-15 VITALS — BP 122/69 | HR 73 | Temp 97.7°F | Resp 18

## 2019-09-15 DIAGNOSIS — N170 Acute kidney failure with tubular necrosis: Secondary | ICD-10-CM | POA: Insufficient documentation

## 2019-09-15 LAB — IRON AND TIBC
Iron: 58 ug/dL (ref 28–170)
Saturation Ratios: 18 % (ref 10.4–31.8)
TIBC: 329 ug/dL (ref 250–450)
UIBC: 271 ug/dL

## 2019-09-15 LAB — POCT HEMOGLOBIN-HEMACUE: Hemoglobin: 11.5 g/dL — ABNORMAL LOW (ref 12.0–15.0)

## 2019-09-15 LAB — FERRITIN: Ferritin: 173 ng/mL (ref 11–307)

## 2019-09-15 MED ORDER — DARBEPOETIN ALFA 150 MCG/0.3ML IJ SOSY
PREFILLED_SYRINGE | INTRAMUSCULAR | Status: AC
  Filled 2019-09-15: qty 0.3

## 2019-09-15 MED ORDER — DARBEPOETIN ALFA 150 MCG/0.3ML IJ SOSY
150.0000 ug | PREFILLED_SYRINGE | INTRAMUSCULAR | Status: DC
  Administered 2019-09-15: 150 ug via SUBCUTANEOUS

## 2019-09-30 ENCOUNTER — Telehealth: Payer: Self-pay | Admitting: Internal Medicine

## 2019-09-30 NOTE — Telephone Encounter (Signed)
Called spoke with patient who is requesting another office visit (last seen by [facility] NP on 1.4.21) for her sinusitis.  Reports no improvement, still having some clear mucus with occasional yellow/green tint.  She is now also producing some bloody mucus.  Patient has been scheduled for another visit (telephone) with [facility] NP for tomorrow 10/01/19 @ 1030.  Nothing further needed at this time; will sign off.

## 2019-10-01 ENCOUNTER — Telehealth: Payer: Self-pay | Admitting: Primary Care

## 2019-10-01 ENCOUNTER — Ambulatory Visit (INDEPENDENT_AMBULATORY_CARE_PROVIDER_SITE_OTHER): Payer: Medicaid Other | Admitting: Primary Care

## 2019-10-01 ENCOUNTER — Encounter: Payer: Self-pay | Admitting: Primary Care

## 2019-10-01 ENCOUNTER — Other Ambulatory Visit: Payer: Self-pay

## 2019-10-01 DIAGNOSIS — M318 Other specified necrotizing vasculopathies: Secondary | ICD-10-CM

## 2019-10-01 DIAGNOSIS — J0100 Acute maxillary sinusitis, unspecified: Secondary | ICD-10-CM | POA: Diagnosis not present

## 2019-10-01 NOTE — Patient Instructions (Signed)
-   Discussed with Dr. Chase Caller, recommending one time dose Rituxan IV - Please take tylenol 650mg  and benadryl 50mg  once an hour before infusion   Follow-up 2-4 weeks after infusion

## 2019-10-01 NOTE — Telephone Encounter (Signed)
Need to set this patient up for Rituxan infusion next week. Will need pre-medication with tylenol 650mg  and benadryl 50mg  once   MR patient

## 2019-10-01 NOTE — Progress Notes (Signed)
Virtual Visit via Telephone Note  I connected with Alesa Daise on 10/01/19 at 10:30 AM EST by telephone and verified that I am speaking with the correct person using two identifiers.  Location: Patient: Home Provider: Office  I discussed the limitations, risks, security and privacy concerns of performing an evaluation and management service by telephone and the availability of in person appointments. I also discussed with the patient that there may be a patient responsible charge related to this service. The patient expressed understanding and agreed to proceed.  History of Present Illness: 64 year old female, former smoker (Quit smoking in July 2020). PMH significant for asthma with COPD, type 2 diabetes, HTN, wegener granulomatosis with renal involvement, systemic vasculitis syndrome, cutaneous vasculitis, CKD, HLD, substance abuse. Patient of Dr. Chase Caller, last seen  05/07/19. Most recent creatinine in December improved at 2.4 and UA normal. Eosinophil absolute 100.   Previous [facility] pulmonary encounter: 09/14/2019 Patient presents today for follow-up visit. Called office on 09/07/19 with acute symptoms of nasal congestion, states that she was unable to breath out one side of her nose.She has been using saline nasal spray with no improvement and over the counter decongestant. She was given a short course of prednisone and prescribed doxycycline to take if drainage is discolored. She feels significantly better. Mucus is clear, occasionally yellow/green tinge. No blood. Experiences dyspnea with walking. Associated wheezing. She is asking about another inhaler such as Trelegy. Uses her rescue inhaler every time she goes outside, states that she needs to sit down to catch her breath. Uses Albuterol hfa approx 3-5 times a day. Denies fever, chills, shortness of breath at rest, no chest tightness, headache, vision change, sinus pain.   10/01/2019  Patient contacted today for acute televisit. Hx wegeners  vasculitis. Reports continued sinusitis symptoms. She had improvement with oral doxycycline, prednisone and Flonase nasal spray. She does not have any active symptoms of infection.  States that when she blows her nose she gets out blood "clots".  She continues on prophylactic Bactrim.  Breathing appears stable with some improvement since she quit smoking and changing Spiriva to [employer].  No significant cough or mucus production.  Denies headache, vision changes or pain.   Observations/Objective:  No significant shortness of breath, wheezing or cough during televisit today She is able to speak in full sentences  Assessment and Plan:  Systemic vasculitis syndrome: - Reports sinusitis symptoms with blood clots. Assuming d/t vasculitis, no active signs of infection at this time. Treated with doxycycline 09/14/19.  - UA normal and creatinine improved in December 2020. Hgb stable in January.  - Continues Bastrim DS 1 tab three times a week - B-cell flow cytometry was repeated in August 2020 and showed a 3% B-cell population - Discussed with Dr. Chase Caller, recommending one time dose Rituxan 800mg  (375mg /m2 once) IV. Will need pre-medication with tylenol 650mg  and benadryl 50mg  once.    Follow Up Instructions:   - After infusion in 2-4 weeks in office with MR or Beth NP   I discussed the assessment and treatment plan with the patient. The patient was provided an opportunity to ask questions and all were answered. The patient agreed with the plan and demonstrated an understanding of the instructions.   The patient was advised to call back or seek an in-person evaluation if the symptoms worsen or if the condition fails to improve as anticipated.  I provided 22 minutes of non-face-to-face time during this encounter.   Martyn Ehrich, NP

## 2019-10-05 ENCOUNTER — Telehealth: Payer: Self-pay | Admitting: Internal Medicine

## 2019-10-05 NOTE — Telephone Encounter (Signed)
Called and spoke with pt about her last visit with [facility] 1/21 and stated to her after visit, Beth spoke with MR and decision was made for her to get a rituxan infusion. Pt verbalized understanding. Stated to pt that she needed to contact Short Stay to schedule infusion appt and pt verbalized understanding. Pt already had phone number as she verbalized it to me while I looked at number while pt stated it to me. Pt will be calling Short Stay to get infusion scheduled and I have faxed over pt's order information for the rituxan infusion. Nothing further needed.

## 2019-10-05 NOTE — Telephone Encounter (Signed)
Catherine Aguilar w/[facility] short stay regarding infusion for IV Rituxan.  Needs authorization.  [id number]

## 2019-10-05 NOTE — Telephone Encounter (Signed)
Spoke with pt, states that she needs to be scheduled for Rituxan, but must be scheduled at least 3 days out to schedule transportation. Will call short stay tomorrow ([phone]) since it is after 5:00.

## 2019-10-06 NOTE — Telephone Encounter (Signed)
Catherine Aguilar for [place] at [place].

## 2019-10-07 NOTE — Telephone Encounter (Signed)
Called and spoke to Catherine Aguilar LP with [facility] Short Stay and was advised that the form needed for short stay was not completed to the fullest. She states #8 needs to be completed. Catherine Aguilar states she will fax this to our main fax to complete and fax back. Will keep in triage to look out for.

## 2019-10-08 NOTE — Telephone Encounter (Signed)
Submitted a Prior Authorization request to [employer] for Rituxan- [id number] via [employer] portal. Will update once we receive a response.  Auth# [id number]

## 2019-10-08 NOTE — Telephone Encounter (Signed)
Located the Physician Order form for short stay to infuse Rituxan. Number 8 is about insurance authorization (form will be given to [place]). Called and spoke to Alma she states this has not been authorized through insurance yet for pt to receive her infusion.   Pharmacy, are you able to help with this?

## 2019-10-11 ENCOUNTER — Other Ambulatory Visit: Payer: Self-pay | Admitting: Family Medicine

## 2019-10-11 ENCOUNTER — Other Ambulatory Visit: Payer: Self-pay | Admitting: Internal Medicine

## 2019-10-12 NOTE — Telephone Encounter (Signed)
Called [facility] to check status of Rituxan Medical PA, Rep Alveta Heimlich advised that PA is still under review and stated that the typical turnaround for medical PA's is about 1-2 weeks. I asked if there was a way to expedite and he stated no and said they are reviewed in the order they were received. Will continue to check status. Patient should wait to schedule infusion once we receive approval.  Ref# [id number] Auth# [id number] W  [facility] Phone# [phone]  8:25 AM Beatriz Chancellor, CPhT

## 2019-10-13 ENCOUNTER — Other Ambulatory Visit: Payer: Self-pay

## 2019-10-13 ENCOUNTER — Telehealth: Payer: Self-pay | Admitting: Internal Medicine

## 2019-10-13 ENCOUNTER — Ambulatory Visit (HOSPITAL_COMMUNITY)
Admission: RE | Admit: 2019-10-13 | Discharge: 2019-10-13 | Disposition: A | Discharge status: Home / Self Care | Payer: Medicaid Other | Source: Ambulatory Visit | Attending: Nephrology | Admitting: Nephrology

## 2019-10-13 VITALS — BP 145/74 | HR 74 | Temp 97.5°F | Resp 18

## 2019-10-13 DIAGNOSIS — N170 Acute kidney failure with tubular necrosis: Secondary | ICD-10-CM | POA: Diagnosis present

## 2019-10-13 LAB — IRON AND TIBC
Iron: 41 ug/dL (ref 28–170)
Saturation Ratios: 13 % (ref 10.4–31.8)
TIBC: 312 ug/dL (ref 250–450)
UIBC: 271 ug/dL

## 2019-10-13 LAB — POCT HEMOGLOBIN-HEMACUE: Hemoglobin: 11.4 g/dL — ABNORMAL LOW (ref 12.0–15.0)

## 2019-10-13 LAB — FERRITIN: Ferritin: 139 ng/mL (ref 11–307)

## 2019-10-13 MED ORDER — DARBEPOETIN ALFA 150 MCG/0.3ML IJ SOSY: 150.0000 ug | PREFILLED_SYRINGE | INTRAMUSCULAR | Status: DC

## 2019-10-13 MED ORDER — DARBEPOETIN ALFA 150 MCG/0.3ML IJ SOSY
PREFILLED_SYRINGE | INTRAMUSCULAR | Status: AC
  Administered 2019-10-13: 150 ug via SUBCUTANEOUS
  Filled 2019-10-13: qty 0.3

## 2019-10-13 NOTE — Telephone Encounter (Signed)
There is another open encounter about pt's Rituxan so with this being a duplicate, I'm closing the encounter.

## 2019-10-13 NOTE — Telephone Encounter (Signed)
Pt calling back stating she needs the medicine as her condition has been going on for 2-3 weeks. [phone]

## 2019-10-13 NOTE — Telephone Encounter (Signed)
Called and spoke with pt. Pt stated she still has not been able to receive the rituxan infusion as part of the paperwork was received but there was still some info that was missing. Pt said she really needs the rituxan as every time when she has a sinus infection, she will get a nosebleed and the only thing to really help with this is the rituxan.  Stated to pt that I would look into this to see what is still needed and I would get it taken care of so she can be able to be scheduled to have the rituxan infusion and pt verbalized understanding.  Checked pt's chart and saw there is a phone note from 1/25 in regards to a PA that was done. Rachael, please advise if you have an update in regards to this as pt desperately needs to be scheduled to have an infusion.

## 2019-10-14 ENCOUNTER — Other Ambulatory Visit: Payer: Self-pay | Admitting: *Deleted

## 2019-10-14 MED ORDER — ACCU-CHEK AVIVA PLUS VI STRP: ORAL_STRIP | 4 refills | Status: DC

## 2019-10-14 NOTE — Telephone Encounter (Signed)
Called and spoke with Laverne at [facility] Stay letting her know that per Ralph Leyden, Pharmacy PA is not needed for pt's insurance. Laverne verbalized understanding and asked me what pt's insurance was and I stated to her that pt had medicaid and the PA was attempted but then suspended when we found out that it was not needed.  Laverne stated that pt can call to get the infusion scheduled as she had everything she was needing.  Attempted to call pt to let her know she can call Laverne at [facility] at [phone] to make an appt for her rituxan infusion but unable to reach. Left message for pt to return call.

## 2019-10-14 NOTE — Telephone Encounter (Signed)
Called [facility], [place] was reviewed and was suspended because No PA is required for Rituxan- [id number], when billing with FDA appropriate DX code.  [facility] Phone# [phone]  8:51 AM Beatriz Chancellor, CPhT   8:50 AM Beatriz Chancellor, CPhT

## 2019-10-14 NOTE — Telephone Encounter (Signed)
pt calling back about her getting the rituxan. spoke with Racheal. they are just waiting for the orders to be put it in.

## 2019-10-14 NOTE — Telephone Encounter (Signed)
I faxed the form to short stay with all the instructions on it after it was signed by Derl Barrow on 10/05/19.  I have already put it in folder to have it sent to scan.

## 2019-10-14 NOTE — Telephone Encounter (Signed)
LMTCB

## 2019-10-14 NOTE — Telephone Encounter (Signed)
Another message was sent to [facility] regarding this matter. Please see most recent note.

## 2019-10-14 NOTE — Telephone Encounter (Signed)
Catherine Sarna, do you have the short stay form? Section 8 on Short Stay form needs to be completed and fax back to short stay letting them know a PA is not needed.

## 2019-10-15 NOTE — Telephone Encounter (Signed)
According to patient's appointment desk, she is scheduled for infusion on 2.10.21 Nothing further needed at this time.

## 2019-10-16 NOTE — Telephone Encounter (Signed)
Message will be closed. Medication was approved and pt has been scheduled for infusion per other telephone encounters.

## 2019-10-20 ENCOUNTER — Other Ambulatory Visit (HOSPITAL_COMMUNITY): Payer: Self-pay | Admitting: *Deleted

## 2019-10-21 ENCOUNTER — Telehealth: Payer: Self-pay | Admitting: General Surgery

## 2019-10-21 ENCOUNTER — Other Ambulatory Visit: Payer: Self-pay

## 2019-10-21 ENCOUNTER — Ambulatory Visit (HOSPITAL_COMMUNITY)
Admission: RE | Admit: 2019-10-21 | Discharge: 2019-10-21 | Disposition: A | Discharge status: Home / Self Care | Payer: Medicaid Other | Source: Ambulatory Visit | Attending: Primary Care | Admitting: Primary Care

## 2019-10-21 ENCOUNTER — Telehealth: Payer: Self-pay | Admitting: Primary Care

## 2019-10-21 DIAGNOSIS — M318 Other specified necrotizing vasculopathies: Secondary | ICD-10-CM | POA: Diagnosis not present

## 2019-10-21 MED ORDER — ACETAMINOPHEN 325 MG PO TABS
650.0000 mg | ORAL_TABLET | Freq: Once | ORAL | Status: AC
  Administered 2019-10-21: 650 mg via ORAL

## 2019-10-21 MED ORDER — DIPHENHYDRAMINE HCL 25 MG PO CAPS
50.0000 mg | ORAL_CAPSULE | Freq: Once | ORAL | Status: AC
  Administered 2019-10-21: 50 mg via ORAL

## 2019-10-21 MED ORDER — DIPHENHYDRAMINE HCL 25 MG PO CAPS
ORAL_CAPSULE | ORAL | Status: AC
  Filled 2019-10-21: qty 2

## 2019-10-21 MED ORDER — SODIUM CHLORIDE 0.9 % IV SOLN
800.0000 mg | Freq: Once | INTRAVENOUS | Status: AC
  Administered 2019-10-21: 800 mg via INTRAVENOUS
  Filled 2019-10-21: qty 80

## 2019-10-21 MED ORDER — ACETAMINOPHEN 325 MG PO TABS
ORAL_TABLET | ORAL | Status: AC
  Filled 2019-10-21: qty 2

## 2019-10-21 NOTE — Telephone Encounter (Signed)
Hinton Dyer took call.Hillery Hunter

## 2019-10-21 NOTE — Telephone Encounter (Signed)
No known clinical history of TB. Ok to proceed with infusion

## 2019-10-21 NOTE — Progress Notes (Signed)
Pt here today for Rituxan.  Pt does not know if she has ever had a TB test and pt is on Bactim DS 3 times a week and in unsure why or how long she will be on it.  Called Geraldo Pitter NP regarding the above for clearance to proceed with Rituxan per protocal.  Hinton Dyer returned my call and stated Benjamine Mola is aware of her Bactim DS and stated she is not concerned about her risk to TB because the patient does not travel internationally and we are okay to proceed with Rituxan.

## 2019-10-21 NOTE — Telephone Encounter (Signed)
Catherine Aguilar,  Received call from Catherine Aguilar with Medical Stay and she stated the patient was there for her infusion. But when asked screening questions, stated she was taking Bactrim DS three times a week.   The patient was not sure if she ever had TB, did not know her status.  Catherine Aguilar was calling to confirm if they can proceed with the infusion or if the patient needs to be tested for TB.  Her call back # [phone].

## 2019-10-21 NOTE — Telephone Encounter (Signed)
Called Kristen back and advised her of the response received. Nothing further needed at this time.

## 2019-10-21 NOTE — Telephone Encounter (Signed)
See telephone note dated 10/21/19. Medical Day advised of response from [facility] regarding patient ability to proceed with infusion today.  Nothing further needed at this time.

## 2019-11-10 ENCOUNTER — Other Ambulatory Visit: Payer: Self-pay

## 2019-11-10 ENCOUNTER — Ambulatory Visit (HOSPITAL_COMMUNITY)
Admission: RE | Admit: 2019-11-10 | Discharge: 2019-11-10 | Disposition: A | Discharge status: Home / Self Care | Payer: Medicaid Other | Source: Ambulatory Visit | Attending: Nephrology | Admitting: Nephrology

## 2019-11-10 VITALS — BP 147/68 | HR 74 | Temp 97.3°F | Resp 18

## 2019-11-10 DIAGNOSIS — N170 Acute kidney failure with tubular necrosis: Secondary | ICD-10-CM | POA: Insufficient documentation

## 2019-11-10 LAB — IRON AND TIBC
Iron: 44 ug/dL (ref 28–170)
Saturation Ratios: 13 % (ref 10.4–31.8)
TIBC: 328 ug/dL (ref 250–450)
UIBC: 284 ug/dL

## 2019-11-10 LAB — POCT HEMOGLOBIN-HEMACUE: Hemoglobin: 11 g/dL — ABNORMAL LOW (ref 12.0–15.0)

## 2019-11-10 LAB — FERRITIN: Ferritin: 173 ng/mL (ref 11–307)

## 2019-11-10 MED ORDER — DARBEPOETIN ALFA 150 MCG/0.3ML IJ SOSY
150.0000 ug | PREFILLED_SYRINGE | INTRAMUSCULAR | Status: DC
  Administered 2019-11-10: 150 ug via SUBCUTANEOUS

## 2019-11-10 MED ORDER — DARBEPOETIN ALFA 150 MCG/0.3ML IJ SOSY
PREFILLED_SYRINGE | INTRAMUSCULAR | Status: AC
  Filled 2019-11-10: qty 0.3

## 2019-12-07 ENCOUNTER — Telehealth: Payer: Self-pay | Admitting: *Deleted

## 2019-12-07 DIAGNOSIS — Z794 Long term (current) use of insulin: Secondary | ICD-10-CM

## 2019-12-07 DIAGNOSIS — E119 Type 2 diabetes mellitus without complications: Secondary | ICD-10-CM

## 2019-12-07 NOTE — Telephone Encounter (Signed)
Patients Accu-check aviva plus meter has broken.  She will need a new meter sent in.  Since she has medicaid she will need a New Accucheck Guide meter and strips (Medicaid prefers)  Have added them and will send to PCP for review and completion. Christen Bame, CMA

## 2019-12-08 ENCOUNTER — Ambulatory Visit (HOSPITAL_COMMUNITY)
Admission: RE | Admit: 2019-12-08 | Discharge: 2019-12-08 | Disposition: A | Discharge status: Home / Self Care | Payer: Medicaid Other | Source: Ambulatory Visit | Attending: Nephrology | Admitting: Nephrology

## 2019-12-08 VITALS — BP 140/93 | HR 61 | Temp 96.4°F | Resp 18

## 2019-12-08 DIAGNOSIS — N170 Acute kidney failure with tubular necrosis: Secondary | ICD-10-CM | POA: Insufficient documentation

## 2019-12-08 LAB — POCT HEMOGLOBIN-HEMACUE: Hemoglobin: 11.6 g/dL — ABNORMAL LOW (ref 12.0–15.0)

## 2019-12-08 MED ORDER — DARBEPOETIN ALFA 150 MCG/0.3ML IJ SOSY
PREFILLED_SYRINGE | INTRAMUSCULAR | Status: AC
  Filled 2019-12-08: qty 0.3

## 2019-12-08 MED ORDER — DARBEPOETIN ALFA 150 MCG/0.3ML IJ SOSY
150.0000 ug | PREFILLED_SYRINGE | INTRAMUSCULAR | Status: DC
  Administered 2019-12-08: 150 ug via SUBCUTANEOUS

## 2019-12-16 NOTE — Telephone Encounter (Signed)
Checking status.  Catherine Aguilar, CMA  

## 2019-12-21 NOTE — Telephone Encounter (Signed)
Patient calling again

## 2019-12-22 ENCOUNTER — Other Ambulatory Visit: Payer: Self-pay

## 2019-12-22 NOTE — Telephone Encounter (Signed)
Fax received from pharmacy on behalf of patient, requesting a new meter be sent to pharmacy.  Please see original note from 12-07-19, patient's meter is broken and hasn't been able to check her glucose at home.  Will forward to PCP and advisor.  Britten Seyfried,CMA

## 2019-12-23 MED ORDER — ACCU-CHEK SOFTCLIX LANCET DEV KIT: PACK | 3 refills | Status: DC

## 2019-12-23 MED ORDER — ACCU-CHEK SOFTCLIX LANCETS MISC: 12 refills | Status: DC

## 2019-12-23 MED ORDER — ACCU-CHEK GUIDE W/DEVICE KIT: 1.0000 | PACK | Freq: Once | 0 refills | Status: AC

## 2019-12-23 MED ORDER — ACCU-CHEK GUIDE VI STRP: ORAL_STRIP | 12 refills | Status: DC

## 2019-12-23 NOTE — Telephone Encounter (Signed)
New glucometer sent to pharmacy.   Dorris Singh, MD  Family Medicine Teaching Service

## 2020-01-05 ENCOUNTER — Other Ambulatory Visit: Payer: Self-pay

## 2020-01-05 ENCOUNTER — Ambulatory Visit (HOSPITAL_COMMUNITY)
Admission: RE | Admit: 2020-01-05 | Discharge: 2020-01-05 | Disposition: A | Discharge status: Home / Self Care | Payer: Medicaid Other | Source: Ambulatory Visit | Attending: Nephrology | Admitting: Nephrology

## 2020-01-05 VITALS — BP 157/75 | HR 62 | Temp 97.2°F | Resp 18

## 2020-01-05 DIAGNOSIS — N170 Acute kidney failure with tubular necrosis: Secondary | ICD-10-CM | POA: Insufficient documentation

## 2020-01-05 LAB — IRON AND TIBC
Iron: 67 ug/dL (ref 28–170)
Saturation Ratios: 19 % (ref 10.4–31.8)
TIBC: 349 ug/dL (ref 250–450)
UIBC: 282 ug/dL

## 2020-01-05 LAB — POCT HEMOGLOBIN-HEMACUE: Hemoglobin: 11.8 g/dL — ABNORMAL LOW (ref 12.0–15.0)

## 2020-01-05 LAB — FERRITIN: Ferritin: 72 ng/mL (ref 11–307)

## 2020-01-05 MED ORDER — DARBEPOETIN ALFA 150 MCG/0.3ML IJ SOSY
PREFILLED_SYRINGE | INTRAMUSCULAR | Status: AC
  Filled 2020-01-05: qty 0.3

## 2020-01-05 MED ORDER — DARBEPOETIN ALFA 150 MCG/0.3ML IJ SOSY
150.0000 ug | PREFILLED_SYRINGE | INTRAMUSCULAR | Status: DC
  Administered 2020-01-05: 150 ug via SUBCUTANEOUS

## 2020-01-12 ENCOUNTER — Telehealth: Payer: Self-pay | Admitting: Internal Medicine

## 2020-01-12 NOTE — Telephone Encounter (Signed)
Patient was last seen by inspection of Derl Barrow in February 2021.  Or January 2021.  At that time she had sinusitis issues.  She was given doxycycline and prednisone.  She also reported bloody nasal clots.  It appears on October 20, 2018 when she got a one-time dose of rituximab 80 mg.  After that her sinus and pulmonary symptoms resolved.  She was no longer wheezing and no longer having epistaxis or sinusitis.  According to Dr. Benjamine Mola Upton's note hemoglobin November 27 2105.6.  She had lost weight to 206 pounds by cutting calories.  She also quit smoking in July 2020  However, however April 2021 vasculitis serologies were going up.  MPO was 50.5 with p-ANCA was 1: 640 while the PR-3 and c-ANCA were normal.  Her creatinine was 2.2 mg percent   Plan  -Please arrange for a follow-up visit in June 2021 -30-minute slot (I think Dr. Hollie Salk wants me to be responsible for the protection because I was the first 1 who prescribed it]

## 2020-01-13 NOTE — Telephone Encounter (Signed)
Called and spoke with pt and scheduled her for an appt with MR 6/8. Nothing further needed.

## 2020-02-02 ENCOUNTER — Ambulatory Visit (HOSPITAL_COMMUNITY)
Admission: RE | Admit: 2020-02-02 | Discharge: 2020-02-02 | Disposition: A | Discharge status: Home / Self Care | Payer: Medicaid Other | Source: Ambulatory Visit | Attending: Nephrology | Admitting: Nephrology

## 2020-02-02 ENCOUNTER — Other Ambulatory Visit: Payer: Self-pay

## 2020-02-02 VITALS — BP 180/84 | HR 65 | Temp 97.3°F | Resp 18

## 2020-02-02 DIAGNOSIS — N170 Acute kidney failure with tubular necrosis: Secondary | ICD-10-CM | POA: Diagnosis present

## 2020-02-02 LAB — IRON AND TIBC
Iron: 55 ug/dL (ref 28–170)
Saturation Ratios: 15 % (ref 10.4–31.8)
TIBC: 357 ug/dL (ref 250–450)
UIBC: 302 ug/dL

## 2020-02-02 LAB — FERRITIN: Ferritin: 71 ng/mL (ref 11–307)

## 2020-02-02 LAB — POCT HEMOGLOBIN-HEMACUE: Hemoglobin: 12 g/dL (ref 12.0–15.0)

## 2020-02-02 MED ORDER — DARBEPOETIN ALFA 150 MCG/0.3ML IJ SOSY: 150.0000 ug | PREFILLED_SYRINGE | INTRAMUSCULAR | Status: DC

## 2020-02-05 ENCOUNTER — Other Ambulatory Visit: Payer: Self-pay | Admitting: Internal Medicine

## 2020-02-16 ENCOUNTER — Other Ambulatory Visit: Payer: Self-pay

## 2020-02-16 ENCOUNTER — Encounter: Payer: Self-pay | Admitting: Internal Medicine

## 2020-02-16 ENCOUNTER — Ambulatory Visit: Payer: Medicaid Other | Admitting: Internal Medicine

## 2020-02-16 VITALS — BP 134/82 | HR 60 | Temp 98.0°F | Ht 67.0 in | Wt 209.2 lb

## 2020-02-16 DIAGNOSIS — Z5181 Encounter for therapeutic drug level monitoring: Secondary | ICD-10-CM | POA: Diagnosis not present

## 2020-02-16 DIAGNOSIS — M318 Other specified necrotizing vasculopathies: Secondary | ICD-10-CM | POA: Diagnosis not present

## 2020-02-16 DIAGNOSIS — Z79899 Other long term (current) drug therapy: Secondary | ICD-10-CM | POA: Diagnosis not present

## 2020-02-16 LAB — CBC WITH DIFFERENTIAL/PLATELET
Basophils Absolute: 0 10*3/uL (ref 0.0–0.1)
Basophils Relative: 0.6 % (ref 0.0–3.0)
Eosinophils Absolute: 0.1 10*3/uL (ref 0.0–0.7)
Eosinophils Relative: 1.8 % (ref 0.0–5.0)
HCT: 36.1 % (ref 36.0–46.0)
Hemoglobin: 11.6 g/dL — ABNORMAL LOW (ref 12.0–15.0)
Lymphocytes Relative: 25.5 % (ref 12.0–46.0)
Lymphs Abs: 1.8 10*3/uL (ref 0.7–4.0)
MCHC: 32.1 g/dL (ref 30.0–36.0)
MCV: 80.1 fl (ref 78.0–100.0)
Monocytes Absolute: 0.7 10*3/uL (ref 0.1–1.0)
Monocytes Relative: 9.7 % (ref 3.0–12.0)
Neutro Abs: 4.5 10*3/uL (ref 1.4–7.7)
Neutrophils Relative %: 62.4 % (ref 43.0–77.0)
Platelets: 282 10*3/uL (ref 150.0–400.0)
RBC: 4.51 Mil/uL (ref 3.87–5.11)
RDW: 17 % — ABNORMAL HIGH (ref 11.5–15.5)
WBC: 7.1 10*3/uL (ref 4.0–10.5)

## 2020-02-16 LAB — HEPATIC FUNCTION PANEL
ALT: 15 U/L (ref 0–35)
AST: 17 U/L (ref 0–37)
Albumin: 4 g/dL (ref 3.5–5.2)
Alkaline Phosphatase: 64 U/L (ref 39–117)
Bilirubin, Direct: 0.1 mg/dL (ref 0.0–0.3)
Total Bilirubin: 0.2 mg/dL (ref 0.2–1.2)
Total Protein: 6.5 g/dL (ref 6.0–8.3)

## 2020-02-16 LAB — BASIC METABOLIC PANEL
BUN: 50 mg/dL — ABNORMAL HIGH (ref 6–23)
CO2: 22 mEq/L (ref 19–32)
Calcium: 9.6 mg/dL (ref 8.4–10.5)
Chloride: 107 mEq/L (ref 96–112)
Creatinine, Ser: 2.76 mg/dL — ABNORMAL HIGH (ref 0.40–1.20)
GFR: 20.87 mL/min — ABNORMAL LOW (ref >60.00)
Glucose, Bld: 132 mg/dL — ABNORMAL HIGH (ref 70–99)
Potassium: 4.2 mEq/L (ref 3.5–5.1)
Sodium: 136 mEq/L (ref 135–145)

## 2020-02-16 NOTE — Patient Instructions (Addendum)
ICD-10-CM   1. Systemic vasculitis syndrome ([place])  M31.8   2. Encounter for therapeutic drug level monitoring  Z51.81   3. High risk medication use  Z79.899     Clinically vasculitis in remission Glad smoking in remission Dyspne is mild and stable and likely due to weight, deconditioning  Plan  - check bmet, lft, cbc 02/16/2020 -Hold off on checking flow cytometry for B cells looking for B-cell resurgence  Followup  - will call with blood work results  - keep up renal appointment  - ROV 3 months - 15 min slot -we will consider getting flow cytometry for B cells at this visit

## 2020-02-16 NOTE — Progress Notes (Signed)
OV 05/26/2018  Subjective:  Patient ID: Catherine Aguilar, female , DOB: [birth year] , age 64 y.o. , MRN: [id number] , ADDRESS: [place] [address]   OV 02/12/2017  Chief Complaint  Patient presents with  . Follow-up    Pt states her breathing is doing well. Pt denies any cough, SOB, CP/tightness, and f/c/s.    Follow-up severe mid vasculitis syndrome with life-threatening alveolar hemorrhage and acute renal failure is easter weekend 2018  Currently she is doing well. She has finished 4 doses of Rituxan. Last dose was 01/31/2017. There was significant delay between the first 2 doses which were on schedule in the third and fourth doses delayed. Still on prednisone 60 mg per day. She is accompanied by her daughter [name]. Overall she's doing well. This no hemoptysis anymore. This no shortness of breath. Her G6PD was normal in May 2018. Her Quantiferon Gold was normal. Her most recent chemistries were in May 2018 and a creatinine that improved with 3 mg percent approximately.  OV 05/03/2017  Chief Complaint  Patient presents with  . Follow-up    Pt states her breathing is unchanged since last OV. Pt denies cough, CP/tightness, f/c/s.       64 year old female with systemic vasculitis syndrome. Last dose of Rituxan was in May 2018. She is currently tapered her prednisone down to 10 mg per day. She is on Bactrim prophylaxis. She has visited with nephrology Dr. Lu Duffel in the last 1 month. According to her history blood tests have improved. She does not want to have repeat blood test today. Last blood test was within a month. She is getting iron infusions at short stay. According to the daughter blood pressure was high and one of the infusions was held off. There is no hemoptysis chest pain or hematuria or any clinical evidence of vasculitis. She is due for flu shot.   OV 09/19/2017  Chief Complaint  Patient presents with  . Follow-up    Pt was seen at the hospital  09/02/17 due to breathing problems and after doing breathing treatments, was sent home. Other than that, pt states that she has been doing good.     64 year old female with systemic vasculitis syndrome.  Last dose of Rituxan May 2018.  She had life-threatening hemoptysis and respiratory failure and renal failure back in early 2018.  Last seen in August 2018.  After that she did not follow-up.  She is here with her daughter.  It appears that Christmas Eve 2018 she decided to smoke cigarettes because of the holidays when she got acute respiratory exacerbation with wheezing and went to the emergency department.  I personally reviewed the chart and the chest x-ray that was clear.  She was discharged with a steroid burst.  Now she is back to baseline prednisone 10 mg/day and her baseline Bactrim for her Wegener's vasculitis.  She tells me that she is getting Rituxan shots once a month at the short stay hospital at [facility] but when I reviewed the chart this is actually Aranesp.  She is due for a B-cell flow cytometry test today because it has been 9-10 months.  Overall she is feeling fine except fatigue.  She tells me that she does not have any eye issues or neuritis of mononeuritis multiplex or renal issues although most recent creatinine continues to be on 3 mg percent.  There is no hemoptysis or shortness of breath currently.   OV 01/21/2018  Chief Complaint  Patient  presents with  . Follow-up    Pt states she has been doing good since last visit but states she has an uncontrollable cough that she has had since March 2018 that will not go away. Pt is not coughing up any mucus but does have postnasal drainage that is clear.    64 year old female with systemic vasculitis syndrome with life-threatening hemoptysis and respiratory and renal failure in early 2018.Marland Kitchen  Last dose of Rituxan May 2018.  Since then on daily prednisone and Bactrim prophylaxis.  Last B-cell flow cytometry January 2019 showing  suppression of B cells.  She presents with her daughter.  She as usual is a poor historian.  As best as I can gather it appears since her last visit she is having recurrent episodes of bronchitis and has been treated with 2 rounds of antibiotics/2 rounds of prednisone burst including a February 2019 visit to the emergency department where she had a chest x-ray that I personally visualized and it looks clear.  She also feels that she is having a chronic cough that is worse than her baseline.  She tells me now that she has had this cough since March 2018 when she suffered from vasculitis syndrome but the daughter says that it is certainly worse in the last few months since the last visit associated with chest tightness.  There is no hemoptysis or edema.  In the last week or so she feels like she has had an acute sinusitis with yellow sputum and she wants a different antibiotic.  There is no fever or hemoptysis or edema orthopnea proximal nocturnal dyspnea or weight loss. FeNO 8ppb and does not suggest asthma    05/26/2018 -   Chief Complaint  Patient presents with  . Follow-up    Pt states she has been doing well since last visit except she states still has the uncontrollable cough that she is unable to get rid of. States she is coughing up clear to light yellow to green mucus and has some occ SOB when exerting herself.    64 year old female with systemic vasculitis syndrome with life-threatening hemoptysis and respiratory and renal failure in early 2018.Marland Kitchen  Last dose of Rituxan May 2018.  Since then on daily prednisone and Bactrim prophylaxis.  Last B-cell flow cytometry January 2019 showing suppression of B cells but in summer 2019 emergence with 3% B cells associated with rising MPO tites in renal clinic summer 2019. Rx for relapose 1019m IV rituxan - May 08, 2018. [facility]   Catherine Aguilar 64y.o. -presents for follow-up. Last seen in May 2019. At that time B-cell flow  cytometry was repeated and showed a 3% B-cell population which was the beginning of emergence following Rituxan a year earlier. At that point in her she was feeling okay but she was still smoking. She also had chronic cough which was controlled by  trelegy inhaler. She subsequently saw Dr. DLorrene Reidin nephrology and it was noticed that her MPO titer was coming back and clinically Dr. DLorrene Reidfelt that she was relapsing. Therefore we arranged for single dose Rituxan thousand milligrams which was administered end of August 2019 without any problems. At this point in time her main issues that she continues to smoke. She has a chronic cough which she says is severe but happens only every few weeks or every several days. She is not taking her inhaler regularly. Review of radiology showed last CT scan of the chestwas early in 2018 and  a chest x-ray in February 2019 that was clear. She continues on Bactrim. I do not see prednisone thought at last viit instructed to take pred 28m per day but she says she "finished it" which was not her instruction    OV 09/23/2018  Subjective:  Patient ID: SGwenith Aguilar female , DOB: [birth year], age 64y.o. , MRN: [id number], [place] [id number]  64year old female with systemic vasculitis syndrome with life-threatening hemoptysis and respiratory and renal failure in early 2018..Marland Kitchen Last dose of Rituxan May 2018.  Since then on daily prednisone and Bactrim prophylaxis.  Last B-cell flow cytometry January 2019 showing suppression of B cells but in summer 2019 emergence with 3% B cells associated with rising MPO tites in renal clinic summer 2019. Rx for relapose (based on b cells and creat rise) 10042mIV rituxan - May 08, 2018. [facility], [facility] 2020 but she is on bactrim for prophylaxis   09/23/2018 -   Chief Complaint  Patient presents with  . Follow-up    Pt states she has been doing well since last visit and denies any  complaints.     HPI Catherine Aguilar [age].o. -presents for follow-up of the above issue.  She presents by herself.  As usual she is a poor historian.  This is evidenced by the fact that she does not know that she suffers from vasculitis.  She has no idea what this time even means.  She has no idea that she received Rituxan although she recollects receiving infusion.  Overall she tells me that she is well except for mild chronic cough.  She continues to smoke.  Her last Rituxan infusion was in August 2019.  Then in November 2019 she had a CT scan of the chest that shows new findings of coronary artery calcification.  In addition she is noted to have emphysema.  No obvious evidence of any Wegener's vasculitis.  She is up-to-date with her respiratory vaccines.  She continues on Bactrim prophylaxis but is not on any inhaler therapy other than pro-air.  She is not sure about quitting smoking.    OV 05/07/2019  Subjective:  Patient ID: SuGwenith Spitzfemale , DOB: 03/1956/03/27 age [age].o. , MRN: [id number] [address] [address] [id number] 05/07/2019 -   Chief Complaint  Patient presents with  . Systemic Vaculitis Syndrome    No changes in breathing since last visit in January 2020. Medication usage still the same.     [age]ear old female with systemic vasculitis syndrome with life-threatening hemoptysis and respiratory and renal failure in early 2018.s/p induction Rituxan ending  May 2018.  Tthen on daily prednisone and Bactrim prophylaxis.  Last B-cell flow cytometry January 2019 showing suppression of B cells but in summer 2019 emergence with 3% B cells associated with rising MPO tites in renal clinic summer 2019. Rx for relapose (based on b cells and creat rise) 100096mV rituxan - May 08, 2018. [facility], Jan 2020 and Aug 2020 but she is on bactrim for prophylaxis  Follow-up vasculitis Follow-up emphysema Follow-up smoking  HPI Catherine Aguilar [age]o.  -presents for follow-up.  Last visit was in January 2020 at which time she was in clinical remission from a vasculitis.  I recommended cardiology follow-up for pulmonary artery calcification.  She had a myocardial perfusion test in February 2020 and this was normal.  She continues  to do well.  Her last Rituxan was in August 2019.  She is now worried that her vasculitis is back because she had one episode of epistaxis when she woke up in the middle of the night this was just brief when she blew her nose.  Other than that she is not had any other episode.  She not having any sinus issues.  No respiratory issues.  She says she is renal and her kidney numbers are actually doing better.  She has chronic kidney disease with a creatinine of over 2 in our health system.  She continues with Bactrim but she is not on prednisone.   For emphysema: Last visit I started on Spiriva but I do not see this:  For smoking: She continues to smoke cigarettes.     OV 02/16/2020  Subjective:  Patient ID: Catherine Aguilar, female , DOB: 06/13/1956 , age 57 y.o. , MRN: [id number] , ADDRESS: [place] [id number]   02/16/2020 -   Chief Complaint  Patient presents with  . Follow-up    Pt states she has been doing okay since last visit. States she still becomes SOB and has been coughing a lot. Pt states she will occ cough up beige to light green phlegm.   Follow-up emphysema: On Spiriva Follow-up smoking: Quit summer 2020 Follow-up immunosuppressed status due to vasculitis: On Bactrim prophylaxis Follow-up anemia: On Aranesp monitored by renal. Follow-up vasculitis  - systemic vasculitis syndrome with life-threatening hemoptysis and respiratory and renal failure in early 2018.s/p induction Rituxan ending  May 2018.  Tthen on daily prednisone and Bactrim prophylaxis.  Last B-cell flow cytometry January 2019 showing suppression of B cells but in summer 2019 emergence with 3% B cells associated with rising MPO  tites in renal clinic summer 2019.   -  Rx for relapose (based on b cells and creat rise) 1021m IV rituxan - May 08, 2018.,  80 mg IV x1 October 21, 2019  -Off prednisone because she could not get a refill [[facility], [facility] and Aug 2020 and June 2021)  -  On bactrim for prophylaxis  HPI Catherine Aguilar 64y.o. -returns for follow-up.  Last seen in the summer 2020.  Reviewed by myself.  In the interim she tells me that she has quit smoking.  She says dyspnea stable.  Present on exertion relieved by rest.  No hemoptysis no vasculitis symptoms.  She maintains herself on Spiriva.  Initially she said her last Rituxan was in September 2020.  I question the several times to her and she kept repeating it was September 2020.  Later we reviewed the records from Dr. EMadelon Lipsher nephrologist and her last Rituxan was in February 2021.  Moving forward I believe Dr. UHollie Salkwants me to be in charge of the Rituxan.  Earlier this year she had sinusitis symptoms with bloody nasal clots and shortness of breath and wheezing.  She was treated with conventional antibiotics and prednisone by our nurse practitioner.  Dr. UHollie Salkdid give 1 dose of Rituxan 800 mg on October 21, 2019.  Since this Rituxan dosage her sinus symptoms and her respiratory symptoms have improved.  In April 2020 when she had MPO antibody tested by Dr. UHollie Salkand this was elevated.   Results for HSUMAIYAH, MARKERT(MRN [id number] as of 02/16/2020 09:05  Ref. Range 05/07/2019 10:01 05/27/2019 08:46 08/19/2019 15:17  Creatinine Latest Ref Range: 0.40 - 1.20 mg/dL 2.66 (H) 3.29 (H)  2.40 (H)     CT Chest  IMPRESSION: 1. No compelling findings of interstitial lung disease at this time. 2. Small parenchymal bands scattered in the mid to lower lungs bilaterally, left greater than right, most compatible with mild nonspecific postinfectious/postinflammatory scarring. 3. Mild centrilobular emphysema with mild diffuse bronchial  wall thickening, suggesting COPD. 4. Stable mild cardiomegaly and small pericardial effusion. 5. Three-vessel coronary atherosclerosis.  Aortic Atherosclerosis (ICD10-I70.0) and Emphysema (ICD10-J43.9).   Electronically Signed   By: Ilona Sorrel M.D.   On: 08/01/2018 13:  ROS - per HPI  Results for Catherine, Aguilar (MRN [id number]) as of 02/16/2020 09:05  Ref. Range 07/01/2012 09:50 07/02/2012 05:25 07/07/2012 04:53 10/23/2015 09:25 06/05/2016 22:51 12/07/2016 23:13 12/08/2016 00:01 12/08/2016 01:22 12/08/2016 09:09 12/08/2016 15:20 12/08/2016 18:53 12/09/2016 02:12 12/09/2016 08:39 12/09/2016 18:09 12/12/2016 08:23 12/13/2016 05:22 12/14/2016 04:55 12/15/2016 05:01 12/16/2016 04:45 01/10/2017 10:31 01/28/2017 10:44 01/31/2017 09:37 02/12/2017 11:11 02/28/2017 09:41 04/03/2017 13:23 05/06/2017 11:05 06/11/2017 11:01 07/09/2017 12:14 09/02/2017 21:49 09/12/2017 10:21 09/19/2017 11:00 10/10/2017 10:38 11/07/2017 11:08 12/05/2017 11:24 01/02/2018 12:24 01/21/2018 10:54 01/30/2018 11:16 03/04/2018 10:43 04/03/2018 10:35 05/08/2018 09:38 05/26/2018 10:50 06/05/2018 10:49 07/03/2018 11:11 07/31/2018 11:02 08/28/2018 11:03 09/18/2018 10:21 10/16/2018 11:01 11/13/2018 11:34 01/15/2019 10:37 02/12/2019 11:09 02/26/2019 10:13 04/02/2019 12:10 04/30/2019 12:50 05/07/2019 10:01 05/27/2019 08:46 05/28/2019 12:58 06/19/2019 12:21 07/03/2019 10:44 07/31/2019 09:51 08/18/2019 09:31 09/15/2019 10:11 10/13/2019 10:11 11/10/2019 10:25 12/08/2019 10:38 01/05/2020 10:23 02/02/2020 10:11  Hemoglobin Latest Ref Range: 12.0 - 15.0 g/dL 14.3 13.0 13.5 8.7 (L) 11.3 (L) 6.1 (LL)  5.6 (LL) 5.3 (LL) 6.6 (L) 6.3 (LL) 6.2 (LL) 7.8 (L) 7.7 (L) 9.2 (L) 8.9 (L) 9.7 (L) 9.6 (L) 9.6 (L) 9.9 (L) 9.4 (L) 8.8 (L) 10.0 (L) 9.9 (L) 11.7 (L) 10.7 (L) 11.5 (L) 11.7 (L) 11.1 (L) 11.0 (L) 10.7 (L) 11.2 (L) 10.9 (L) 11.1 (L) 11.0 (L) 10.7 (L) 10.9 (L) 10.9 (L) 11.0 (L) 12.2 10.8 (L) 11.1 (L) 11.2 (L) 11.7 (L) 12.6 11.9 (L) 11.2 (L) 11.9 (L) 11.2 (L) 12.0 11.9 (L) 11.4 (L) 12.1 12.2 11.5 (L) 12.3 12.2 12.1 12.1 11.8  (L) 11.5 (L) 11.4 (L) 11.0 (L) 11.6 (L) 11.8 (L) 12.0  Results for Catherine, Aguilar (MRN [id number]) as of 02/16/2020 09:19  Ref. Range 05/27/2019 08:46  December 10, 2019 done by renal  Myeloperoxidase Abs Latest Units: AI 2.3 (H)  50.5  Serine Protease 3 Latest Units: AI <1.0  less than 3.5  P-ANCA    1: 640  C ANCA    less than 1: 20     has a past medical history of Acute renal failure (ARF) ([place]) (12/08/2016), Acute respiratory failure with hypoxia ([place]) (12/08/2016), AKI (acute kidney injury) ([place]) (12/08/2016), Allergy, Anemia, Asthma with COPD (chronic obstructive pulmonary disease) ([place]) (11/25/2017), CAP (community acquired pneumonia) (12/08/2016), Chronic kidney disease, CKD (chronic kidney disease), stage IV ([place]) (02/11/2017), 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), CVA (cerebral vascular accident) ([place]) (07/03/2012), Diffuse pulmonary alveolar hemorrhage, Edema of right lower extremity (03/25/2017), Essential hypertension (12/08/2016), Hemoptysis (12/08/2016), High cholesterol, HLD (hyperlipidemia) (12/08/2016), adenomatous polyp of colon (04/16/2017), Hypertension, Hypokalemia (07/02/2012), Intra-alveolar hemorrhage (12/08/2016), Left facial numbness (07/02/2012), Sepsis ([place]) (12/08/2016), Shortness of breath (12/02/2017), Stroke [facility]), Substance abuse ([place]) (12/08/2016), Systemic vasculitis syndrome ([place]) (12/08/2016), Tobacco abuse (12/08/2016), Type 2 diabetes mellitus without complication, with long-term current use of insulin ([place]) (02/11/2017), and Wegener's granulomatosis with renal involvement ([place]) (12/08/2016).   reports that she quit smoking about 10 months ago. Her smoking use included cigarettes. She started smoking about 51 years ago. She  has a 9.80 pack-year smoking history. She has never used smokeless tobacco.  Past Surgical History:  Procedure Laterality Date  . ABDOMINAL HYSTERECTOMY  1985    No Known Allergies  Immunization History  Administered Date(s) Administered  . Influenza Split  07/02/2012  . Influenza,inj,Quad PF,6+ Mos 05/03/2017, 05/13/2018  . Pneumococcal Polysaccharide-23 07/02/2012, 11/22/2017    Family History  Problem Relation Age of Onset  . Cancer Father   . Alcohol abuse Father   . Diabetes Father   . Hypertension Father   . Heart attack Mother   . Diabetes Mother   . Hypertension Mother   . Stroke Mother   . Breast cancer Neg Hx   . Colon cancer Neg Hx   . Esophageal cancer Neg Hx   . Rectal cancer Neg Hx   . Stomach cancer Neg Hx   . Pancreatic cancer Neg Hx      Current Outpatient Medications:  .  Accu-Chek Softclix Lancets lancets, Use as instructed, Disp: 100 each, Rfl: 12 .  albuterol (PROVENTIL HFA;VENTOLIN HFA) 108 (90 Base) MCG/ACT inhaler, INHALE 2 PUFFS EVERY 4-6 HOURS, Disp: 6.7 Inhaler, Rfl: 1 .  albuterol (PROVENTIL) (2.5 MG/3ML) 0.083% nebulizer solution, Take 3 mLs (2.5 mg total) by nebulization every 6 (six) hours as needed for wheezing or shortness of breath., Disp: 150 mL, Rfl: 1 .  aspirin 81 MG EC tablet, TAKE 1 TABLET (81 MG TOTAL) BY MOUTH DAILY. SWALLOW WHOLE., Disp: 90 tablet, Rfl: 3 .  fluticasone (FLONASE) 50 MCG/ACT nasal spray, Place 2 sprays into both nostrils daily., Disp: 16 g, Rfl: 2 .  glucose blood (ACCU-CHEK GUIDE) test strip, Check blood glucose prior to lantus administration, Disp: 100 each, Rfl: 12 .  hydrALAZINE (APRESOLINE) 100 MG tablet, Take 100 mg by mouth 3 (three) times daily., Disp: , Rfl:  .  labetalol (NORMODYNE) 300 MG tablet, Take 300 mg by mouth 2 (two) times daily., Disp: , Rfl:  .  Lancets Misc. (ACCU-CHEK SOFTCLIX LANCET DEV) KIT, Check BG prior to administering insulin, Disp: 1 kit, Rfl: 3 .  LANTUS SOLOSTAR 100 UNIT/ML Solostar Pen, INJECT 5 UNITS INTO THE SKIN DAILY AT 10 PM., Disp: 15 mL, Rfl: PRN .  LINZESS 72 MCG capsule, TAKE 1 CAPSULE (72 MCG TOTAL) BY MOUTH DAILY BEFORE BREAKFAST. (Patient taking differently: 72 mcg. As needed), Disp: 30 capsule, Rfl: 0 .  pravastatin (PRAVACHOL)  40 MG tablet, TAKE 1 TABLET BY MOUTH AT BEDTIME FOR CHOLESTEROL, Disp: 90 tablet, Rfl: 3 .  sodium bicarbonate 650 MG tablet, Take 1,300 mg by mouth 2 (two) times daily., Disp: , Rfl:  .  sulfamethoxazole-trimethoprim (BACTRIM DS) 800-160 MG tablet, TAKE 1 TABLET BY MOUTH THREE TIMES A WEEK, Disp: 12 tablet, Rfl: 3 .  Tiotropium Bromide Monohydrate (SPIRIVA RESPIMAT) 1.25 MCG/ACT AERS, Inhale 2 puffs into the lungs daily., Disp: 1 g, Rfl: 6      Objective:   Vitals:   02/16/20 0852  BP: 134/82  Pulse: 60  Temp: 98 F (36.7 C)  TempSrc: Oral  SpO2: 100%  Weight: 209 lb 3.2 oz (94.9 kg)  Height: _0  (1.702 m)    Estimated body mass index is 32.77 kg/m as calculated from the following:   Height as of this encounter: _1  (1.702 m).   Weight as of this encounter: 209 lb 3.2 oz (94.9 kg).  _2 @  [facility] Weights   02/16/20 0852  Weight: 209 lb 3.2 oz (94.9 kg)     Physical Exam Pleasant  female alert and oriented x3.  Clear to auscultation bilaterally normal heart sounds.  Visceral obesity present.  No elevated JVP no neck nodes.  Alert and oriented x3.  Overall nonfocal exam except for visceral obesity         Assessment:       ICD-10-CM   1. Systemic vasculitis syndrome (HCC)  Z61.0 Basic Metabolic Panel (BMET)    Hepatic function panel    CBC w/Diff    CBC w/Diff    Hepatic function panel    Basic Metabolic Panel (BMET)  2. Encounter for therapeutic drug level monitoring  R60.45 Basic Metabolic Panel (BMET)    Hepatic function panel    CBC w/Diff    CBC w/Diff    Hepatic function panel    Basic Metabolic Panel (BMET)  3. High risk medication use  W09.811 Basic Metabolic Panel (BMET)    Hepatic function panel    CBC w/Diff    CBC w/Diff    Hepatic function panel    Basic Metabolic Panel (BMET)   Clinically vasculitis is in remission.  Given the fact she had a dose only in February 2021 and it is only been 4 months I will hold off on getting a flow  cytometry which had originally planned based on the fact she gave me history that her last Rituxan was in September 2020 which ended up being incorrect.  On the other hand we will check basic blood work today  She has upcoming visit with Dr. Hollie Salk in July.  Encouraged her smoking to be in remission    Plan:     Patient Instructions     ICD-10-CM   1. Systemic vasculitis syndrome ([place])  M31.8   2. Encounter for therapeutic drug level monitoring  Z51.81   3. High risk medication use  Z79.899     Clinically vasculitis in remission Glad smoking in remission Dyspne is mild and stable and likely due to weight, deconditioning  Plan  - check bmet, lft, cbc 02/16/2020 -Hold off on checking flow cytometry for B cells looking for B-cell resurgence  Followup  - will call with blood work results  - keep up renal appointment  - ROV 3 months - 15 min slot -we will consider getting flow cytometry for B cells at this visit     SIGNATURE    Dr. Brand Males, M.D., F.C.C.P,  Pulmonary and Critical Care Medicine Staff Physician, [place] Director - Interstitial Lung Disease  Program  Pulmonary [place] at [place], [place], [address]  Pager: [phone], If no answer or between  15:00h - 7:00h: call [phone] Telephone: [phone]  9:27 AM 02/16/2020

## 2020-02-19 ENCOUNTER — Ambulatory Visit (HOSPITAL_COMMUNITY)
Admission: RE | Admit: 2020-02-19 | Discharge: 2020-02-19 | Disposition: A | Discharge status: Home / Self Care | Payer: Medicaid Other | Source: Ambulatory Visit | Attending: Nephrology | Admitting: Nephrology

## 2020-02-19 ENCOUNTER — Other Ambulatory Visit: Payer: Self-pay

## 2020-02-19 VITALS — BP 179/92 | HR 57 | Temp 96.7°F | Resp 18

## 2020-02-19 DIAGNOSIS — N170 Acute kidney failure with tubular necrosis: Secondary | ICD-10-CM | POA: Diagnosis present

## 2020-02-19 LAB — POCT HEMOGLOBIN-HEMACUE: Hemoglobin: 11.7 g/dL — ABNORMAL LOW (ref 12.0–15.0)

## 2020-02-19 MED ORDER — DARBEPOETIN ALFA 150 MCG/0.3ML IJ SOSY
150.0000 ug | PREFILLED_SYRINGE | INTRAMUSCULAR | Status: DC
  Administered 2020-02-19: 150 ug via SUBCUTANEOUS

## 2020-02-19 MED ORDER — DARBEPOETIN ALFA 150 MCG/0.3ML IJ SOSY
PREFILLED_SYRINGE | INTRAMUSCULAR | Status: AC
  Filled 2020-02-19: qty 0.3

## 2020-03-01 ENCOUNTER — Encounter (HOSPITAL_COMMUNITY): Payer: Medicaid Other

## 2020-03-18 ENCOUNTER — Encounter (HOSPITAL_COMMUNITY): Payer: Medicaid Other

## 2020-03-21 ENCOUNTER — Other Ambulatory Visit: Payer: Self-pay

## 2020-03-21 ENCOUNTER — Ambulatory Visit (HOSPITAL_COMMUNITY)
Admission: RE | Admit: 2020-03-21 | Discharge: 2020-03-21 | Disposition: A | Discharge status: Home / Self Care | Payer: Medicare (Managed Care) | Source: Ambulatory Visit | Attending: Nephrology | Admitting: Nephrology

## 2020-03-21 VITALS — BP 137/67 | HR 58 | Temp 97.5°F | Resp 18

## 2020-03-21 DIAGNOSIS — N170 Acute kidney failure with tubular necrosis: Secondary | ICD-10-CM

## 2020-03-21 DIAGNOSIS — N184 Chronic kidney disease, stage 4 (severe): Secondary | ICD-10-CM | POA: Diagnosis not present

## 2020-03-21 DIAGNOSIS — D631 Anemia in chronic kidney disease: Secondary | ICD-10-CM | POA: Insufficient documentation

## 2020-03-21 LAB — IRON AND TIBC
Iron: 60 ug/dL (ref 28–170)
Saturation Ratios: 16 % (ref 10.4–31.8)
TIBC: 379 ug/dL (ref 250–450)
UIBC: 319 ug/dL

## 2020-03-21 LAB — FERRITIN: Ferritin: 65 ng/mL (ref 11–307)

## 2020-03-21 MED ORDER — DARBEPOETIN ALFA 150 MCG/0.3ML IJ SOSY: 150.0000 ug | PREFILLED_SYRINGE | INTRAMUSCULAR | Status: DC

## 2020-03-21 NOTE — Progress Notes (Signed)
Pt leaving shot room and started coughing. Started to sound tight with wheezing. Pt denies asthma but stares she has "smokers cough" Pt taken to ED for evaluation

## 2020-03-22 LAB — POCT HEMOGLOBIN-HEMACUE: Hemoglobin: 12.8 g/dL (ref 12.0–15.0)

## 2020-03-31 ENCOUNTER — Telehealth: Payer: Self-pay | Admitting: Internal Medicine

## 2020-03-31 DIAGNOSIS — J439 Emphysema, unspecified: Secondary | ICD-10-CM

## 2020-03-31 DIAGNOSIS — M318 Other specified necrotizing vasculopathies: Secondary | ICD-10-CM

## 2020-03-31 MED ORDER — SPIRIVA RESPIMAT 1.25 MCG/ACT IN AERS: 2.0000 | INHALATION_SPRAY | Freq: Every day | RESPIRATORY_TRACT | 6 refills | Status: DC

## 2020-03-31 NOTE — Telephone Encounter (Signed)
Spoke with the pt  She is c/o cough with clear to light yellow sputum, increased SOB and wheezing x 1 month  She states no fever, chills, body aches  She ran out of spiriva x 3 wks ago- using albuterol inhaler a couple x per day  I refilled this for her and explained to let us know when she needs refill next time  She is asking for pred and zpack  Please advise, thanks!

## 2020-04-01 MED ORDER — PREDNISONE 10 MG PO TABS
ORAL_TABLET | ORAL | 0 refills | Status: DC
Start: 2020-04-01 — End: 2020-11-11

## 2020-04-01 MED ORDER — AZITHROMYCIN 250 MG PO TABS
250.0000 mg | ORAL_TABLET | ORAL | 0 refills | Status: DC
Start: 2020-04-01 — End: 2020-11-11

## 2020-04-01 NOTE — Telephone Encounter (Signed)
Thanks Magda Paganini and agree  Plan Please take prednisone 40 mg x1 day, then 30 mg x1 day, then 20 mg x1 day, then 10 mg x1 day, and then 5 mg x1 day and stop  Z pak  Ensure folllowup per most recent OV  Agree to restart spiriva

## 2020-04-01 NOTE — Telephone Encounter (Signed)
Spoke with the pt and notified of response per MR  Rxs sent  Sept 2021 still not open for MR but there is a reminder in place for her f/u

## 2020-04-04 ENCOUNTER — Ambulatory Visit (HOSPITAL_COMMUNITY)
Admission: RE | Admit: 2020-04-04 | Discharge: 2020-04-04 | Disposition: A | Discharge status: Home / Self Care | Payer: Medicare (Managed Care) | Source: Ambulatory Visit | Attending: Nephrology | Admitting: Nephrology

## 2020-04-04 ENCOUNTER — Other Ambulatory Visit: Payer: Self-pay

## 2020-04-04 VITALS — BP 174/84 | HR 66 | Temp 97.5°F | Resp 18

## 2020-04-04 DIAGNOSIS — N170 Acute kidney failure with tubular necrosis: Secondary | ICD-10-CM

## 2020-04-04 LAB — POCT HEMOGLOBIN-HEMACUE: Hemoglobin: 12.6 g/dL (ref 12.0–15.0)

## 2020-04-04 MED ORDER — DARBEPOETIN ALFA 150 MCG/0.3ML IJ SOSY
PREFILLED_SYRINGE | INTRAMUSCULAR | Status: AC
  Filled 2020-04-04: qty 0.3

## 2020-04-04 MED ORDER — DARBEPOETIN ALFA 150 MCG/0.3ML IJ SOSY: 150.0000 ug | PREFILLED_SYRINGE | INTRAMUSCULAR | Status: DC

## 2020-04-06 ENCOUNTER — Other Ambulatory Visit: Payer: Self-pay | Admitting: Internal Medicine

## 2020-04-06 ENCOUNTER — Other Ambulatory Visit: Payer: Self-pay | Admitting: Family Medicine

## 2020-04-18 ENCOUNTER — Encounter (HOSPITAL_COMMUNITY): Payer: Medicare (Managed Care)

## 2020-04-18 ENCOUNTER — Other Ambulatory Visit: Payer: Self-pay

## 2020-04-18 ENCOUNTER — Ambulatory Visit (HOSPITAL_COMMUNITY)
Admission: RE | Admit: 2020-04-18 | Discharge: 2020-04-18 | Disposition: A | Discharge status: Home / Self Care | Payer: Medicare (Managed Care) | Source: Ambulatory Visit | Attending: Nephrology | Admitting: Nephrology

## 2020-04-18 VITALS — BP 174/82 | HR 81 | Temp 98.1°F | Resp 18

## 2020-04-18 DIAGNOSIS — N184 Chronic kidney disease, stage 4 (severe): Secondary | ICD-10-CM | POA: Insufficient documentation

## 2020-04-18 DIAGNOSIS — D631 Anemia in chronic kidney disease: Secondary | ICD-10-CM | POA: Insufficient documentation

## 2020-04-18 DIAGNOSIS — N170 Acute kidney failure with tubular necrosis: Secondary | ICD-10-CM

## 2020-04-18 LAB — IRON AND TIBC
Iron: 68 ug/dL (ref 28–170)
Saturation Ratios: 19 % (ref 10.4–31.8)
TIBC: 365 ug/dL (ref 250–450)
UIBC: 297 ug/dL

## 2020-04-18 LAB — FERRITIN: Ferritin: 78 ng/mL (ref 11–307)

## 2020-04-18 MED ORDER — DARBEPOETIN ALFA 150 MCG/0.3ML IJ SOSY: 150.0000 ug | PREFILLED_SYRINGE | INTRAMUSCULAR | Status: DC

## 2020-04-19 LAB — POCT HEMOGLOBIN-HEMACUE: Hemoglobin: 12.1 g/dL (ref 12.0–15.0)

## 2020-04-24 NOTE — Progress Notes (Signed)
Creat is range bound. Will check again sept visit

## 2020-05-02 ENCOUNTER — Other Ambulatory Visit: Payer: Self-pay

## 2020-05-02 ENCOUNTER — Encounter (HOSPITAL_COMMUNITY)
Admission: RE | Admit: 2020-05-02 | Discharge: 2020-05-02 | Disposition: A | Discharge status: Home / Self Care | Payer: Medicare (Managed Care) | Source: Ambulatory Visit | Attending: Nephrology | Admitting: Nephrology

## 2020-05-02 ENCOUNTER — Encounter (HOSPITAL_COMMUNITY): Payer: Medicare (Managed Care)

## 2020-05-02 VITALS — BP 171/97 | HR 73 | Temp 97.3°F | Resp 18

## 2020-05-02 DIAGNOSIS — N170 Acute kidney failure with tubular necrosis: Secondary | ICD-10-CM | POA: Diagnosis present

## 2020-05-02 LAB — POCT HEMOGLOBIN-HEMACUE: Hemoglobin: 12.3 g/dL (ref 12.0–15.0)

## 2020-05-02 MED ORDER — DARBEPOETIN ALFA 150 MCG/0.3ML IJ SOSY: 150.0000 ug | PREFILLED_SYRINGE | INTRAMUSCULAR | Status: DC

## 2020-05-13 ENCOUNTER — Other Ambulatory Visit: Payer: Self-pay | Admitting: Family Medicine

## 2020-05-17 ENCOUNTER — Encounter (HOSPITAL_COMMUNITY): Payer: Medicare (Managed Care)

## 2020-05-30 ENCOUNTER — Other Ambulatory Visit: Payer: Self-pay

## 2020-05-30 ENCOUNTER — Encounter (HOSPITAL_COMMUNITY): Payer: Medicare (Managed Care)

## 2020-05-30 ENCOUNTER — Ambulatory Visit (INDEPENDENT_AMBULATORY_CARE_PROVIDER_SITE_OTHER): Payer: Medicare (Managed Care) | Admitting: Family Medicine

## 2020-05-30 ENCOUNTER — Encounter: Payer: Self-pay | Admitting: Family Medicine

## 2020-05-30 VITALS — BP 114/60 | HR 83 | Ht 67.0 in | Wt 218.2 lb

## 2020-05-30 DIAGNOSIS — Z23 Encounter for immunization: Secondary | ICD-10-CM | POA: Diagnosis not present

## 2020-05-30 DIAGNOSIS — E119 Type 2 diabetes mellitus without complications: Secondary | ICD-10-CM | POA: Diagnosis not present

## 2020-05-30 DIAGNOSIS — I10 Essential (primary) hypertension: Secondary | ICD-10-CM

## 2020-05-30 DIAGNOSIS — E785 Hyperlipidemia, unspecified: Secondary | ICD-10-CM

## 2020-05-30 DIAGNOSIS — Z794 Long term (current) use of insulin: Secondary | ICD-10-CM

## 2020-05-30 DIAGNOSIS — Z1159 Encounter for screening for other viral diseases: Secondary | ICD-10-CM

## 2020-05-30 DIAGNOSIS — R0981 Nasal congestion: Secondary | ICD-10-CM

## 2020-05-30 LAB — POCT GLYCOSYLATED HEMOGLOBIN (HGB A1C): HbA1c, POC (controlled diabetic range): 6.3 % (ref 0.0–7.0)

## 2020-05-30 MED ORDER — CETIRIZINE HCL 10 MG PO TABS: 10.0000 mg | ORAL_TABLET | Freq: Every day | ORAL | 2 refills | Status: DC

## 2020-05-30 NOTE — Assessment & Plan Note (Signed)
-  Checking lipid panel today -Continue pravastatin 40 mg

## 2020-05-30 NOTE — Assessment & Plan Note (Signed)
Patient stuffy nose and "snotty nose" is most likely related to her Wegener's granulomatosis. -Patient to try Flonase diligently for the next 2 weeks, if improvement can continue, if no improvement can stop taking. -Add on Zyrtec 10 mg daily

## 2020-05-30 NOTE — Progress Notes (Signed)
SUBJECTIVE:   CHIEF COMPLAINT / HPI:   Sinus issues, stuffy nose: This is a pleasant patient with history of Wegener's granulomatosis with polyangiitis.  She reports that for the last 6 months she has been having clear/yellow nasal discharge that she often feels that she is "snorting" from her nose back into her throat.  Denies any fevers, body aches, chills, itchy eyes, runny nose, sore throat, face pain associated with this new symptom.  Reports that every now and then she has a cough, also has chronic shortness of breath due to history of COPD, but denies any changes with this.  Is asking if there is anything she can have to reduce her symptoms.  Diabetes: HbA1c today 6.3%.  Patient taking Lantus 5 units daily.  Due for foot exam and eye exam, patient will see her ophthalmologist within the next couple of months, last saw him 1 year ago.  Foot exam today is normal.  Patient receiving her pneumonia shot today.  HTN: Patient with history of high blood pressure, blood pressure today 114/60.  Patient takes hydralazine 100 mg 3 times daily and labetalol 300 mg twice daily.  Hyperlipidemia: Patient's last lipid panel in the chart is from 2012.  She is taking pravastatin 40 mg daily.  We will recheck lipid panel at today's visit.  Health maintenance: due for Hep C screen, DEXA scan, COVID vaccine, Flu vaccine, PNA Vaccine, TDap  PERTINENT  PMH / PSH:  Patient Active Problem List   Diagnosis Date Noted  . Need for pneumococcal vaccination 05/30/2020  . Need for hepatitis C screening test 05/30/2020  . Stuffy nose 05/30/2020  . Acute maxillary sinusitis 09/14/2019  . Shortness of breath 12/02/2017  . Asthma with COPD (chronic obstructive pulmonary disease) ([place]) 11/25/2017  . Hx of adenomatous polyp of colon 04/16/2017  . CKD (chronic kidney disease), stage IV ([place]) 02/11/2017  . Type 2 diabetes mellitus without complication, with long-term current use of insulin ([place]) 02/11/2017  .  Essential hypertension 12/08/2016  . HLD (hyperlipidemia) 12/08/2016  . Symptomatic anemia 12/08/2016  . Wegener's granulomatosis with renal involvement ([place]) 12/08/2016  . Systemic vasculitis syndrome ([place]) 12/08/2016  . Acute renal failure (ARF) ([place]) 12/08/2016  . 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 Substance abuse ([place]) 12/08/2016  . Health care maintenance 12/08/2016  . CVA (cerebral vascular accident) ([place]) 07/03/2012     OBJECTIVE:   BP 114/60   Pulse 83   Ht 5\' 7"  (1.702 m)   Wt 218 lb 4 oz (99 kg)   SpO2 94%   BMI 34.18 kg/m    Physical exam: General: Well-appearing patient, no apparent distress Respiratory: Clear bilaterally, comfortable work of breathing, good air movement Cardio: RRR, S1-S2 present, no murmurs   ASSESSMENT/PLAN:   Essential hypertension Blood pressure 114/60.   -Continue current regimen  Type 2 diabetes mellitus without complication, with long-term current use of insulin ([facility]) HbA1c today 6.3%. -Encouraged good dietary habits -Continue Lantus 5 units daily  HLD (hyperlipidemia) -Checking lipid panel today -Continue pravastatin 40 mg  Need for hepatitis C screening test -Hepatitis C antibody screening checked today  Need for pneumococcal vaccination -Patient received pneumonia vaccine today  Stuffy nose Patient stuffy nose and "snotty nose" is most likely related to her Wegener's granulomatosis. -Patient to try Flonase diligently for the next 2 weeks, if improvement can continue, if no improvement can stop taking. -Add on Zyrtec 10 mg daily     Daisy Floro, [place]

## 2020-05-30 NOTE — Assessment & Plan Note (Signed)
-  Patient received pneumonia vaccine today

## 2020-05-30 NOTE — Assessment & Plan Note (Signed)
Blood pressure 114/60.   -Continue current regimen

## 2020-05-30 NOTE — Patient Instructions (Signed)
Thank you for coming in to see [place] today! Please see below to review our plan for today's visit:  1. Flonase nasal spray to each nostril once daily for at least 2 weeks.  2. Take Zyrtec/Cetirizine daily. 3. I will call with results.   Please call the clinic at [phone] if your symptoms worsen or you have any concerns. It was our pleasure to serve you!   Dr. Milus Banister [facility]

## 2020-05-30 NOTE — Assessment & Plan Note (Signed)
HbA1c today 6.3%. -Encouraged good dietary habits -Continue Lantus 5 units daily

## 2020-05-30 NOTE — Assessment & Plan Note (Signed)
-  Hepatitis C antibody screening checked today

## 2020-05-31 LAB — LIPID PANEL
Chol/HDL Ratio: 4.4 ratio (ref 0.0–4.4)
Cholesterol, Total: 149 mg/dL (ref 100–199)
HDL: 34 mg/dL — ABNORMAL LOW (ref >39)
LDL Chol Calc (NIH): 85 mg/dL (ref 0–99)
Triglycerides: 172 mg/dL — ABNORMAL HIGH (ref 0–149)
VLDL Cholesterol Cal: 30 mg/dL (ref 5–40)

## 2020-05-31 LAB — HEPATITIS C ANTIBODY: Hep C Virus Ab: <0.1 s/co ratio (ref 0.0–0.9)

## 2020-06-01 ENCOUNTER — Encounter: Payer: Self-pay | Admitting: Family Medicine

## 2020-06-03 ENCOUNTER — Other Ambulatory Visit: Payer: Self-pay | Admitting: Internal Medicine

## 2020-06-06 ENCOUNTER — Ambulatory Visit: Payer: Medicare (Managed Care) | Admitting: Internal Medicine

## 2020-06-07 ENCOUNTER — Ambulatory Visit (INDEPENDENT_AMBULATORY_CARE_PROVIDER_SITE_OTHER): Payer: Medicare (Managed Care) | Admitting: Internal Medicine

## 2020-06-07 ENCOUNTER — Other Ambulatory Visit: Payer: Self-pay

## 2020-06-07 ENCOUNTER — Encounter: Payer: Self-pay | Admitting: Internal Medicine

## 2020-06-07 VITALS — BP 140/74 | HR 66 | Temp 98.1°F | Ht 67.0 in | Wt 217.8 lb

## 2020-06-07 DIAGNOSIS — D849 Immunodeficiency, unspecified: Secondary | ICD-10-CM | POA: Diagnosis not present

## 2020-06-07 DIAGNOSIS — Z5181 Encounter for therapeutic drug level monitoring: Secondary | ICD-10-CM | POA: Diagnosis not present

## 2020-06-07 DIAGNOSIS — Z79899 Other long term (current) drug therapy: Secondary | ICD-10-CM | POA: Diagnosis not present

## 2020-06-07 DIAGNOSIS — Z7189 Other specified counseling: Secondary | ICD-10-CM

## 2020-06-07 DIAGNOSIS — M318 Other specified necrotizing vasculopathies: Secondary | ICD-10-CM | POA: Diagnosis not present

## 2020-06-07 DIAGNOSIS — F1290 Cannabis use, unspecified, uncomplicated: Secondary | ICD-10-CM

## 2020-06-07 DIAGNOSIS — Z7185 Encounter for immunization safety counseling: Secondary | ICD-10-CM

## 2020-06-07 DIAGNOSIS — J439 Emphysema, unspecified: Secondary | ICD-10-CM

## 2020-06-07 LAB — HEPATIC FUNCTION PANEL
ALT: 16 U/L (ref 0–35)
AST: 19 U/L (ref 0–37)
Albumin: 3.9 g/dL (ref 3.5–5.2)
Alkaline Phosphatase: 60 U/L (ref 39–117)
Bilirubin, Direct: 0 mg/dL (ref 0.0–0.3)
Total Bilirubin: 0.3 mg/dL (ref 0.2–1.2)
Total Protein: 6.5 g/dL (ref 6.0–8.3)

## 2020-06-07 LAB — BASIC METABOLIC PANEL
BUN: 50 mg/dL — ABNORMAL HIGH (ref 6–23)
CO2: 23 mEq/L (ref 19–32)
Calcium: 9.5 mg/dL (ref 8.4–10.5)
Chloride: 105 mEq/L (ref 96–112)
Creatinine, Ser: 2.74 mg/dL — ABNORMAL HIGH (ref 0.40–1.20)
GFR: 21.03 mL/min — ABNORMAL LOW (ref >60.00)
Glucose, Bld: 121 mg/dL — ABNORMAL HIGH (ref 70–99)
Potassium: 4.1 mEq/L (ref 3.5–5.1)
Sodium: 136 mEq/L (ref 135–145)

## 2020-06-07 LAB — CBC
HCT: 34.3 % — ABNORMAL LOW (ref 36.0–46.0)
Hemoglobin: 11.2 g/dL — ABNORMAL LOW (ref 12.0–15.0)
MCHC: 32.5 g/dL (ref 30.0–36.0)
MCV: 82.3 fl (ref 78.0–100.0)
Platelets: 253 10*3/uL (ref 150.0–400.0)
RBC: 4.17 Mil/uL (ref 3.87–5.11)
RDW: 15.6 % — ABNORMAL HIGH (ref 11.5–15.5)
WBC: 5.9 10*3/uL (ref 4.0–10.5)

## 2020-06-07 LAB — SARS-COV-2 IGG: SARS-COV-2 IgG: 0.31

## 2020-06-07 NOTE — Progress Notes (Signed)
OV 05/26/2018  Subjective:  Patient ID: Catherine Aguilar, female , DOB: 14-Feb-1956 , age 64 y.o. , MRN: [id number] , ADDRESS: [place]  [id number]   OV 02/12/2017  Chief Complaint  Patient presents with  . Follow-up    Pt states her breathing is doing well. Pt denies any cough, SOB, CP/tightness, and f/c/s.    Follow-up severe mid vasculitis syndrome with life-threatening alveolar hemorrhage and acute renal failure is easter weekend 2018  Currently she is doing well. She has finished 4 doses of Rituxan. Last dose was 01/31/2017. There was significant delay between the first 2 doses which were on schedule in the third and fourth doses delayed. Still on prednisone 60 mg per day. She is accompanied by her daughter Catherine Aguilar. Overall she's doing well. This no hemoptysis anymore. This no shortness of breath. Her G6PD was normal in May 2018. Her Quantiferon Gold was normal. Her most recent chemistries were in May 2018 and a creatinine that improved with 3 mg percent approximately.  OV 05/03/2017  Chief Complaint  Patient presents with  . Follow-up    Pt states her breathing is unchanged since last OV. Pt denies cough, CP/tightness, f/c/s.       64 year old female with systemic vasculitis syndrome. Last dose of Rituxan was in May 2018. She is currently tapered her prednisone down to 10 mg per day. She is on Bactrim prophylaxis. She has visited with nephrology Catherine Aguilar in the last 1 month. According to her history blood tests have improved. She does not want to have repeat blood test today. Last blood test was within a month. She is getting iron infusions at short stay. According to the daughter blood pressure was high and one of the infusions was held off. There is no hemoptysis chest pain or hematuria or any clinical evidence of vasculitis. She is due for flu shot.   OV 09/19/2017  Chief Complaint  Patient presents with  . Follow-up    Pt was seen at the hospital  09/02/17 due to breathing problems and after doing breathing treatments, was sent home. Other than that, pt states that she has been doing good.     64 year old female with systemic vasculitis syndrome.  Last dose of Rituxan May 2018.  She had life-threatening hemoptysis and respiratory failure and renal failure back in early 2018.  Last seen in August 2018.  After that she did not follow-up.  She is here with her daughter.  It appears that Christmas Eve 2018 she decided to smoke cigarettes because of the holidays when she got acute respiratory exacerbation with wheezing and went to the emergency department.  I personally reviewed the chart and the chest x-ray that was clear.  She was discharged with a steroid burst.  Now she is back to baseline prednisone 10 mg/day and her baseline Bactrim for her Wegener's vasculitis.  She tells me that she is getting Rituxan shots once a month at the short stay hospital at [facility] but when I reviewed the chart this is actually Aranesp.  She is due for a B-cell flow cytometry test today because it has been 9-10 months.  Overall she is feeling fine except fatigue.  She tells me that she does not have any eye issues or neuritis of mononeuritis multiplex or renal issues although most recent creatinine continues to be on 3 mg percent.  There is no hemoptysis or shortness of breath currently.   OV 01/21/2018  Chief Complaint  Patient presents with  . Follow-up    Pt states she has been doing good since last visit but states she has an uncontrollable cough that she has had since March 2018 that will not go away. Pt is not coughing up any mucus but does have postnasal drainage that is clear.    64 year old female with systemic vasculitis syndrome with life-threatening hemoptysis and respiratory and renal failure in early 2018.Marland Kitchen  Last dose of Rituxan May 2018.  Since then on daily prednisone and Bactrim prophylaxis.  Last B-cell flow cytometry January 2019 showing  suppression of B cells.  She presents with her daughter.  She as usual is a poor historian.  As best as I can gather it appears since her last visit she is having recurrent episodes of bronchitis and has been treated with 2 rounds of antibiotics/2 rounds of prednisone burst including a February 2019 visit to the emergency department where she had a chest x-ray that I personally visualized and it looks clear.  She also feels that she is having a chronic cough that is worse than her baseline.  She tells me now that she has had this cough since March 2018 when she suffered from vasculitis syndrome but the daughter says that it is certainly worse in the last few months since the last visit associated with chest tightness.  There is no hemoptysis or edema.  In the last week or so she feels like she has had an acute sinusitis with yellow sputum and she wants a different antibiotic.  There is no fever or hemoptysis or edema orthopnea proximal nocturnal dyspnea or weight loss. FeNO 8ppb and does not suggest asthma    05/26/2018 -   Chief Complaint  Patient presents with  . Follow-up    Pt states she has been doing well since last visit except she states still has the uncontrollable cough that she is unable to get rid of. States she is coughing up clear to light yellow to green mucus and has some occ SOB when exerting herself.    64 year old female with systemic vasculitis syndrome with life-threatening hemoptysis and respiratory and renal failure in early 2018.Marland Kitchen  Last dose of Rituxan May 2018.  Since then on daily prednisone and Bactrim prophylaxis.  Last B-cell flow cytometry January 2019 showing suppression of B cells but in summer 2019 emergence with 3% B cells associated with rising MPO tites in renal clinic summer 2019. Rx for relapose 1058m IV rituxan - May 08, 2018. [facility]   Catherine Aguilar 64y.o. -presents for follow-up. Last seen in May 2019. At that time B-cell flow  cytometry was repeated and showed a 3% B-cell population which was the beginning of emergence following Rituxan a year earlier. At that point in her she was feeling okay but she was still smoking. She also had chronic cough which was controlled by  trelegy inhaler. She subsequently saw Dr. DLorrene Reidin nephrology and it was noticed that her MPO titer was coming back and clinically Dr. DLorrene Reidfelt that she was relapsing. Therefore we arranged for single dose Rituxan thousand milligrams which was administered end of August 2019 without any problems. At this point in time her main issues that she continues to smoke. She has a chronic cough which she says is severe but happens only every few weeks or every several days. She is not taking her inhaler regularly. Review of radiology showed last CT scan of the chestwas early in 2018  and a chest x-ray in February 2019 that was clear. She continues on Bactrim. I do not see prednisone thought at last viit instructed to take pred $RemoveB'5mg'rIthHSFq$  per day but she says she "finished it" which was not her instruction    OV 09/23/2018  Subjective:  Patient ID: Catherine Aguilar, female , DOB: [birth year] , age 64 y.o. , MRN: [id number] , ADDRESS: [place] [address]   64 year old female with systemic vasculitis syndrome with life-threatening hemoptysis and respiratory and renal failure in early 2018.Marland Kitchen  Last dose of Rituxan May 2018.  Since then on daily prednisone and Bactrim prophylaxis.  Last B-cell flow cytometry January 2019 showing suppression of B cells but in summer 2019 emergence with 3% B cells associated with rising MPO tites in renal clinic summer 2019. Rx for relapose (based on b cells and creat rise) $RemoveBef'1000mg'VzYGTLcXdZ$  IV rituxan - May 08, 2018. Prednison not in [facility]  Sept 2019, Jan 2020 but she is on bactrim for prophylaxis   09/23/2018 -   Chief Complaint  Patient presents with  . Follow-up    Pt states she has been doing well since last visit and denies any  complaints.     HPI Catherine Aguilar 64 y.o. -presents for follow-up of the above issue.  She presents by herself.  As usual she is a poor historian.  This is evidenced by the fact that she does not know that she suffers from vasculitis.  She has no idea what this time even means.  She has no idea that she received Rituxan although she recollects receiving infusion.  Overall she tells me that she is well except for mild chronic cough.  She continues to smoke.  Her last Rituxan infusion was in August 2019.  Then in November 2019 she had a CT scan of the chest that shows new findings of coronary artery calcification.  In addition she is noted to have emphysema.  No obvious evidence of any Wegener's vasculitis.  She is up-to-date with her respiratory vaccines.  She continues on Bactrim prophylaxis but is not on any inhaler therapy other than pro-air.  She is not sure about quitting smoking.    OV 05/07/2019  Subjective:  Patient ID: Catherine Aguilar, female , DOB: [birth year] , age 47 y.o. , MRN: [id number] , ADDRESS: [place] [address]   05/07/2019 -   Chief Complaint  Patient presents with  . Systemic Vaculitis Syndrome    No changes in breathing since last visit in January 2020. Medication usage still the same.     64 year old female with systemic vasculitis syndrome with life-threatening hemoptysis and respiratory and renal failure in early 2018.s/p induction Rituxan ending  May 2018.  Tthen on daily prednisone and Bactrim prophylaxis.  Last B-cell flow cytometry January 2019 showing suppression of B cells but in summer 2019 emergence with 3% B cells associated with rising MPO tites in renal clinic summer 2019. Rx for relapose (based on b cells and creat rise) $RemoveBef'1000mg'TkJogfyuRG$  IV rituxan - May 08, 2018. Prednison not in [facility]  Sept 2019, Jan 2020 and Aug 2020 but she is on bactrim for prophylaxis  Follow-up vasculitis Follow-up emphysema Follow-up smoking  HPI Catherine Aguilar 64 y.o.  -presents for follow-up.  Last visit was in January 2020 at which time she was in clinical remission from a vasculitis.  I recommended cardiology follow-up for pulmonary artery calcification.  She had a myocardial perfusion test in February 2020 and this was normal.  She  continues to do well.  Her last Rituxan was in August 2019.  She is now worried that her vasculitis is back because she had one episode of epistaxis when she woke up in the middle of the night this was just brief when she blew her nose.  Other than that she is not had any other episode.  She not having any sinus issues.  No respiratory issues.  She says she is renal and her kidney numbers are actually doing better.  She has chronic kidney disease with a creatinine of over 2 in our health system.  She continues with Bactrim but she is not on prednisone.   For emphysema: Last visit I started on Spiriva but I do not see this:  For smoking: She continues to smoke cigarettes.     OV 02/16/2020  Subjective:  Patient ID: Catherine Aguilar, female , DOB: 25-Jan-1956 , age 35 y.o. , MRN: [id number] , ADDRESS: [place] [address]   02/16/2020 -   Chief Complaint  Patient presents with  . Follow-up    Pt states she has been doing okay since last visit. States she still becomes SOB and has been coughing a lot. Pt states she will occ cough up beige to light green phlegm.   Follow-up emphysema: On Spiriva Follow-up smoking: Quit summer 2020 Follow-up immunosuppressed status due to vasculitis: On Bactrim prophylaxis Follow-up anemia: On Aranesp monitored by renal. Follow-up vasculitis  - systemic vasculitis syndrome with life-threatening hemoptysis and respiratory and renal failure in early 2018.s/p induction Rituxan ending  May 2018.  Tthen on daily prednisone and Bactrim prophylaxis.  Last B-cell flow cytometry January 2019 showing suppression of B cells but in summer 2019 emergence with 3% B cells associated with rising MPO  tites in renal clinic summer 2019.   -  Rx for relapose (based on b cells and creat rise) $RemoveBef'1000mg'hxWlKotazz$  IV rituxan - May 08, 2018.,  80 mg IV x1 October 21, 2019  -Off prednisone because she could not get a refill [Prednison not in [facility]  Sept 2019, Jan 2020 and Aug 2020 and June 2021)  -  On bactrim for prophylaxis  HPI Cadey Dudzik 64 y.o. -returns for follow-up.  Last seen in the summer 2020.  Reviewed by myself.  In the interim she tells me that she has quit smoking.  She says dyspnea stable.  Present on exertion relieved by rest.  No hemoptysis no vasculitis symptoms.  She maintains herself on Spiriva.  Initially she said her last Rituxan was in September 2020.  I question the several times to her and she kept repeating it was September 2020.  Later we reviewed the records from Dr. Madelon Lips her nephrologist and her last Rituxan was in February 2021.  Moving forward I believe Dr. Hollie Salk wants me to be in charge of the Rituxan.  Earlier this year she had sinusitis symptoms with bloody nasal clots and shortness of breath and wheezing.  She was treated with conventional antibiotics and prednisone by our nurse practitioner.  Dr. Hollie Salk did give 1 dose of Rituxan 800 mg on October 21, 2019.  Since this Rituxan dosage her sinus symptoms and her respiratory symptoms have improved.  In April 2020 when she had MPO antibody tested by Dr. Hollie Salk and this was elevated.     OV 06/07/2020   Subjective:  Patient ID: Catherine Aguilar, female , DOB: Sep 05, 1956, age 38 y.o. years. , MRN: [id number],  ADDRESS: [place] [address]  PCP  Daisy Floro, DO Providers : Treatment Team:  Attending Provider: Brand Males, MD   Chief Complaint  Patient presents with  . Follow-up    no complaints     Follow-up emphysema: On Spiriva Follow-up smoking: Quit summer 2020 Followup MJ smoking: admitted this to Sept 2021 (+ve urine march2018) Follow-up immunosuppressed status due to  vasculitis: On Bactrim prophylaxis Follow-up anemia: On Aranesp monitored by renal. Follow-up vasculitis  - systemic vasculitis syndrome with life-threatening hemoptysis and respiratory and renal failure in early 2018.s/p induction Rituxan ending  May 2018.  Tthen on daily prednisone and Bactrim prophylaxis.  Last B-cell flow cytometry January 2019 showing suppression of B cells but in summer 2019 emergence with 3% B cells associated with rising MPO tites in renal clinic summer 2019.   -  Rx for relapose (based on b cells and creat rise) $RemoveBef'1000mg'pYIxmhvEPX$  IV rituxan - May 08, 2018.,  80 mg IV x1 October 21, 2019  -Off prednisone because she could not get a refill [Prednison not in [facility]  Sept 2019, Jan 2020 and Aug 2020 and June 2021 and sept 2021)  -  On bactrim for prophylaxis   HPI Catherine Aguilar 65 y.o. -returns for routine follow-up. In the interim she called in for an exacerbation and we gave her prednisone antibiotic. At this point in time she is not on chronic prednisone. She is on Spiriva. She feels well. No hemoptysis no respiratory complaints. She says she has had a Covid vaccine but is not documented. She says she had [place]. She also states that the season she has had her flu and pneumonia vaccines. She is not had a shingles vaccine. She does not realize that she is immunosuppressed and needs a Covid booster. She think she has to wait 6 months for Covid booster but it explained to her the literature about getting a booster on immunosuppression. Her last Rituxan was in Feb 2021. She is willing to get monitoring test labs. She is immunosuppressed.  CT Chest  IMPRESSION: 1. No compelling findings of interstitial lung disease at this time. 2. Small parenchymal bands scattered in the mid to lower lungs bilaterally, left greater than right, most compatible with mild nonspecific postinfectious/postinflammatory scarring. 3. Mild centrilobular emphysema with mild diffuse bronchial wall thickening,  suggesting COPD. 4. Stable mild cardiomegaly and small pericardial effusion. 5. Three-vessel coronary atherosclerosis.  Aortic Atherosclerosis (ICD10-I70.0) and Emphysema (ICD10-J43.9).   Electronically Signed   By: Ilona Sorrel M.D.   On: 08/01/2018 13:  ROS - per HPI  Results for CELISE, BAZAR (MRN [id number]) as of 02/16/2020 09:19  Ref. Range 05/27/2019 08:46  December 10, 2019 done by renal  Myeloperoxidase Abs Latest Units: AI 2.3 (H)  50.5  Serine Protease 3 Latest Units: AI <1.0  less than 3.5  P-ANCA    1: 640  C ANCA    less than 1: 20   ROS - per HPI   Results for AJANI, SCHNIEDERS (MRN [id number]) as of 06/07/2020 13:38  Ref. Range 12/05/2017 11:15 01/02/2018 12:17 01/21/2018 10:54 01/30/2018 11:30 03/04/2018 10:31 04/03/2018 10:36 05/08/2018 09:16 05/26/2018 10:50 06/05/2018 10:52 07/03/2018 11:08 07/31/2018 11:30 08/28/2018 11:15 10/16/2018 10:56 11/13/2018 12:00 01/15/2019 10:46 02/12/2019 11:00 04/02/2019 12:07 05/07/2019 10:01 05/27/2019 08:46 05/28/2019 13:09 06/19/2019 12:21 07/31/2019 09:54 08/19/2019 15:17 09/15/2019 10:14 10/13/2019 10:30 11/10/2019 10:22 01/05/2020 10:12 02/02/2020 10:07 02/16/2020 09:19 03/21/2020 09:51 04/18/2020 08:34 05/30/2020 10:45  Creatinine Latest Ref Range: 0.40 - 1.20 mg/dL   2.73 (H)     2.98 (  H)          2.66 (H) 3.29 (H)    2.40 (H)      2.76 (H)        has a past medical history of Acute renal failure (ARF) (HCC) (12/08/2016), Acute respiratory failure with hypoxia (HCC) (12/08/2016), AKI (acute kidney injury) (HCC) (12/08/2016), Allergy, Anemia, Asthma with COPD (chronic obstructive pulmonary disease) (HCC) (11/25/2017), CAP (community acquired pneumonia) (12/08/2016), Chronic kidney disease, CKD (chronic kidney disease), stage IV (HCC) (02/11/2017), 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), CVA (cerebral vascular accident) (HCC) (07/03/2012), Diffuse pulmonary alveolar hemorrhage, Edema of right lower extremity (03/25/2017), Essential hypertension (12/08/2016), Hemoptysis (12/08/2016),  High cholesterol, HLD (hyperlipidemia) (12/08/2016), adenomatous polyp of colon (04/16/2017), Hypertension, Hypokalemia (07/02/2012), Intra-alveolar hemorrhage (12/08/2016), Left facial numbness (07/02/2012), Sepsis (HCC) (12/08/2016), Shortness of breath (12/02/2017), Stroke [facility]), Substance abuse (HCC) (12/08/2016), 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   reports that she quit smoking about 14 months ago. Her smoking use included cigarettes. She started smoking about 51 years ago. She has a 9.80 pack-year smoking history. She has never used smokeless tobacco.  Past Surgical History:  Procedure Laterality Date  . ABDOMINAL HYSTERECTOMY  1985    No Known Allergies  Immunization History  Administered Date(s) Administered  . Influenza Split 07/02/2012  . Influenza,inj,Quad PF,6+ Mos 05/03/2017, 05/13/2018, 06/08/2019  . Pneumococcal Polysaccharide-23 07/02/2012, 11/22/2017, 05/30/2020    Family History  Problem Relation Age of Onset  . Cancer Father   . Alcohol abuse Father   . Diabetes Father   . Hypertension Father   . Heart attack Mother   . Diabetes Mother   . Hypertension Mother   . Stroke Mother   . Breast cancer Neg Hx   . Colon cancer Neg Hx   . Esophageal cancer Neg Hx   . Rectal cancer Neg Hx   . Stomach cancer Neg Hx   . Pancreatic cancer Neg Hx      Current Outpatient Medications:  .  Accu-Chek Softclix Lancets lancets, Use as instructed, Disp: 100 each, Rfl: 12 .  albuterol (PROAIR HFA) 108 (90 Base) MCG/ACT inhaler, INHALE 2 PUFFS BY MOUTH EVERY 4-6 HOURS, Disp: 8 g, Rfl: 2 .  albuterol (PROVENTIL) (2.5 MG/3ML) 0.083% nebulizer solution, Take 3 mLs (2.5 mg total) by nebulization every 6 (six) hours as needed for wheezing or shortness of breath., Disp: 150 mL, Rfl:  1 .  aspirin 81 MG EC tablet, TAKE 1 TABLET (81 MG TOTAL) BY MOUTH DAILY. SWALLOW WHOLE., Disp: 90 tablet, Rfl: 3 .  azithromycin (ZITHROMAX) 250 MG tablet, Take 1 tablet (250 mg total) by mouth as directed., Disp: 6 tablet, Rfl: 0 .  cetirizine (ZYRTEC) 10 MG tablet, Take 1 tablet (10 mg total) by mouth daily., Disp: 30 tablet, Rfl: 2 .  fluticasone (FLONASE) 50 MCG/ACT nasal spray, Place 2 sprays into both nostrils daily., Disp: 16 g, Rfl: 2 .  glucose blood (ACCU-CHEK GUIDE) test strip, Check blood glucose prior to lantus administration, Disp: 100 each, Rfl: 12 .  hydrALAZINE (APRESOLINE) 100 MG tablet, Take 100 mg by mouth 3 (three) times daily., Disp: , Rfl:  .  labetalol (NORMODYNE) 300 MG tablet, Take 300 mg by mouth 2 (two) times daily., Disp: , Rfl:  .  Lancets Misc. (ACCU-CHEK SOFTCLIX LANCET DEV) KIT, Check BG prior to administering insulin, Disp: 1 kit, Rfl: 3 .  LANTUS  SOLOSTAR 100 UNIT/ML Solostar Pen, INJECT 5 UNITS INTO THE SKIN DAILY AT 10 PM., Disp: 15 mL, Rfl: PRN .  LINZESS 72 MCG capsule, TAKE 1 CAPSULE (72 MCG TOTAL) BY MOUTH DAILY BEFORE BREAKFAST. (Patient taking differently: 72 mcg. As needed), Disp: 30 capsule, Rfl: 0 .  pravastatin (PRAVACHOL) 40 MG tablet, TAKE 1 TABLET BY MOUTH EVERY DAY AT BEDTIME FOR CHOLESTEROL, Disp: 90 tablet, Rfl: 3 .  predniSONE (DELTASONE) 10 MG tablet, 4 x 1 day 3 x 1 day 2 x 1 day 1 x 1 day 1/2 x 1 day then stop, Disp: 11 tablet, Rfl: 0 .  sodium bicarbonate 650 MG tablet, Take 1,300 mg by mouth 2 (two) times daily., Disp: , Rfl:  .  sulfamethoxazole-trimethoprim (BACTRIM DS) 800-160 MG tablet, TAKE 1 TABLET BY MOUTH THREE TIMES A WEEK, Disp: 12 tablet, Rfl: 3 .  Tiotropium Bromide Monohydrate (SPIRIVA RESPIMAT) 1.25 MCG/ACT AERS, Inhale 2 puffs into the lungs daily., Disp: 4 g, Rfl: 6      Objective:   Vitals:   06/07/20 1331  BP: 140/74  Pulse: 66  Temp: 98.1 F (36.7 C)  TempSrc: Temporal  SpO2: 95%  Weight: 217 lb 12.8 oz (98.8  kg)  Height: $Remove'5\' 7"'OYDgBAm$  (1.702 m)    Estimated body mass index is 34.11 kg/m as calculated from the following:   Height as of this encounter: $RemoveBeforeD'5\' 7"'fVbHqKHZBCPRcr$  (1.702 m).   Weight as of this encounter: 217 lb 12.8 oz (98.8 kg).  $Rem'@WEIGHTCHANGE'dMmT$ @  [employer]   06/07/20 1331  Weight: 217 lb 12.8 oz (98.8 kg)     Physical Exam  General: No distress.  Neuro: Alert and Oriented x 3. GCS 15. Speech normal Psych: Pleasant Resp: Clear to ausucultation bilaterally. No wheeze No crackles CVS: Normal heart sounds. Murmurs - no HEENT: Normal upper airway. PEERL +. No post nasal drip   Assessment:       ICD-10-CM   1. Systemic vasculitis syndrome ([place])  M31.8   2. Encounter for therapeutic drug level monitoring  Z51.81   3. High risk medication use  Z79.899   4. Immunosuppressed status ([place])  D84.9   5. Therapeutic drug monitoring  Z51.81   6. Marijuana smoker  F12.90   7. Vaccine counseling  Z71.89   8. Pulmonary emphysema, unspecified emphysema type ([place])  J43.9        Plan:     Patient Instructions     ICD-10-CM   1. Systemic vasculitis syndrome ([place])  M31.8   2. Encounter for therapeutic drug level monitoring  Z51.81   3. High risk medication use  Z79.899   4. Immunosuppressed status ([place])  D84.9   5. Therapeutic drug monitoring  Z51.81   6. Marijuana smoker  F12.90   7. Vaccine counseling  Z71.89   8. Pulmonary emphysema, unspecified emphysema type ([place])  J43.9   .   VAsculitis Immunosuppressed status  Clinically vasculitis in remission Glad smoking in remission Dyspne is mild and stable and likely due to weight, deconditioning and mild associated emphysema  Plan  - check bmet, lft, cbc 06/07/2020  -Check Covid IgG and QuantiFERON gold blood test today 05/30/2020  -Check  flow cytometry for B cells looking for B-cell resurgence - to be done at   -our office wil call you later with the order and instructions   Emphysema  - continue spiriva   Marijuana use   - please quit a[employer]  VAccine cousneling -CMA to document that he got [employer]  Covid vaccine -Recommend Covid booster - Please talk to PCP Daisy Floro, DO -  and ensure you get  shingrix ([place]) inactivated vaccine against shingles   Followup  -JAn 2022  or sooner if needed     SIGNATURE    Dr. Brand Males, M.D., F.C.C.P,  Pulmonary and Critical Care Medicine Staff Physician, [place] Director - Interstitial Lung Disease  Program  Pulmonary [facility] [place], [place], [address]  Pager: [phone], If no answer or between  15:00h - 7:00h: call [phone] Telephone: [phone]  1:52 PM 06/07/2020

## 2020-06-07 NOTE — Patient Instructions (Addendum)
ICD-10-CM   1. Systemic vasculitis syndrome ([place])  M31.8   2. Encounter for therapeutic drug level monitoring  Z51.81   3. High risk medication use  Z79.899   4. Immunosuppressed status ([place])  D84.9   5. Therapeutic drug monitoring  Z51.81   6. Marijuana smoker  F12.90   7. Vaccine counseling  Z71.89   8. Pulmonary emphysema, unspecified emphysema type ([place])  J43.9   .   VAsculitis Immunosuppressed status  Clinically vasculitis in remission Glad smoking in remission Dyspne is mild and stable and likely due to weight, deconditioning and mild associated emphysema  Plan  - check bmet, lft, cbc 06/07/2020  -Check Covid IgG and QuantiFERON gold blood test today 05/30/2020  -Check  flow cytometry for B cells looking for B-cell resurgence - to be done at [facility]  -our office wil call you later with the order and instructions   Emphysema  - continue spiriva   Marijuana use  - please quit aSAP  VAccine cousneling -CMA to document that he got Pfizer Covid vaccine -Recommend Covid booster - Please talk to PCP Daisy Floro, DO -  and ensure you get  shingrix ([place]) inactivated vaccine against shingles   Followup  -JAn 2022  or sooner if needed

## 2020-06-07 NOTE — Progress Notes (Signed)
Creatining range bound and stable. COVID IgG is negative - suggesting she does NOt have antibody after covid vaccine or she had it and is gone. She needs to get BOOSTER covid vaccine asap

## 2020-06-08 ENCOUNTER — Telehealth: Payer: Self-pay | Admitting: Internal Medicine

## 2020-06-08 NOTE — Telephone Encounter (Signed)
Message communicated with our [place], she states this was a lab that needed to be drawn at [facility] , she was able to take care of issue.   Nothing further needed at this time.

## 2020-06-10 LAB — QUANTIFERON-TB GOLD PLUS
Mitogen-NIL: >10 IU/mL
NIL: 0.04 IU/mL
QuantiFERON-TB Gold Plus: NEGATIVE
TB1-NIL: <0 IU/mL
TB2-NIL: <0 IU/mL

## 2020-06-13 NOTE — Progress Notes (Signed)
Quantiferon gold TB test is negative. Will not call with this result

## 2020-06-14 ENCOUNTER — Encounter (HOSPITAL_COMMUNITY): Payer: Medicare (Managed Care)

## 2020-06-20 ENCOUNTER — Ambulatory Visit (HOSPITAL_COMMUNITY)
Admission: RE | Admit: 2020-06-20 | Discharge: 2020-06-20 | Disposition: A | Discharge status: Home / Self Care | Payer: Medicare (Managed Care) | Source: Ambulatory Visit | Attending: Nephrology | Admitting: Nephrology

## 2020-06-20 ENCOUNTER — Other Ambulatory Visit: Payer: Self-pay

## 2020-06-20 VITALS — BP 152/69 | HR 69 | Resp 20

## 2020-06-20 DIAGNOSIS — N184 Chronic kidney disease, stage 4 (severe): Secondary | ICD-10-CM | POA: Insufficient documentation

## 2020-06-20 DIAGNOSIS — D631 Anemia in chronic kidney disease: Secondary | ICD-10-CM | POA: Insufficient documentation

## 2020-06-20 DIAGNOSIS — N170 Acute kidney failure with tubular necrosis: Secondary | ICD-10-CM

## 2020-06-20 LAB — POCT HEMOGLOBIN-HEMACUE: Hemoglobin: 12.8 g/dL (ref 12.0–15.0)

## 2020-06-20 LAB — IRON AND TIBC
Iron: 49 ug/dL (ref 28–170)
Saturation Ratios: 13 % (ref 10.4–31.8)
TIBC: 382 ug/dL (ref 250–450)
UIBC: 333 ug/dL

## 2020-06-20 LAB — FERRITIN: Ferritin: 69 ng/mL (ref 11–307)

## 2020-06-20 MED ORDER — DARBEPOETIN ALFA 150 MCG/0.3ML IJ SOSY: 150.0000 ug | PREFILLED_SYRINGE | INTRAMUSCULAR | Status: DC

## 2020-06-20 NOTE — Progress Notes (Signed)
Pt hgb today 12.8. Aranesp held as ordered, but pt is not going to come in 2 weeks for recheck but rescheduled for 1 month out due to transportation issues.

## 2020-07-12 ENCOUNTER — Encounter (HOSPITAL_COMMUNITY): Payer: Medicare (Managed Care)

## 2020-07-17 ENCOUNTER — Other Ambulatory Visit: Payer: Self-pay | Admitting: Family Medicine

## 2020-07-17 DIAGNOSIS — R0981 Nasal congestion: Secondary | ICD-10-CM

## 2020-07-18 ENCOUNTER — Ambulatory Visit (HOSPITAL_COMMUNITY)
Admission: RE | Admit: 2020-07-18 | Discharge: 2020-07-18 | Disposition: A | Discharge status: Home / Self Care | Payer: Medicare (Managed Care) | Source: Ambulatory Visit | Attending: Nephrology | Admitting: Nephrology

## 2020-07-18 ENCOUNTER — Other Ambulatory Visit: Payer: Self-pay

## 2020-07-18 VITALS — BP 141/84 | HR 56 | Temp 97.8°F | Resp 18

## 2020-07-18 DIAGNOSIS — D631 Anemia in chronic kidney disease: Secondary | ICD-10-CM | POA: Diagnosis not present

## 2020-07-18 DIAGNOSIS — N170 Acute kidney failure with tubular necrosis: Secondary | ICD-10-CM | POA: Insufficient documentation

## 2020-07-18 LAB — IRON AND TIBC
Iron: 88 ug/dL (ref 28–170)
Saturation Ratios: 23 % (ref 10.4–31.8)
TIBC: 377 ug/dL (ref 250–450)
UIBC: 289 ug/dL

## 2020-07-18 LAB — POCT HEMOGLOBIN-HEMACUE: Hemoglobin: 12.4 g/dL (ref 12.0–15.0)

## 2020-07-18 LAB — FERRITIN: Ferritin: 83 ng/mL (ref 11–307)

## 2020-07-18 MED ORDER — DARBEPOETIN ALFA 150 MCG/0.3ML IJ SOSY: 150.0000 ug | PREFILLED_SYRINGE | INTRAMUSCULAR | Status: DC

## 2020-08-05 ENCOUNTER — Other Ambulatory Visit: Payer: Self-pay | Admitting: Family Medicine

## 2020-08-14 ENCOUNTER — Other Ambulatory Visit: Payer: Self-pay | Admitting: Family Medicine

## 2020-08-15 ENCOUNTER — Ambulatory Visit (HOSPITAL_COMMUNITY)
Admission: RE | Admit: 2020-08-15 | Discharge: 2020-08-15 | Disposition: A | Discharge status: Home / Self Care | Payer: Medicare (Managed Care) | Source: Ambulatory Visit | Attending: Nephrology | Admitting: Nephrology

## 2020-08-15 ENCOUNTER — Other Ambulatory Visit: Payer: Self-pay

## 2020-08-15 VITALS — BP 176/71 | HR 59 | Temp 97.9°F | Resp 18

## 2020-08-15 DIAGNOSIS — D631 Anemia in chronic kidney disease: Secondary | ICD-10-CM | POA: Insufficient documentation

## 2020-08-15 DIAGNOSIS — N170 Acute kidney failure with tubular necrosis: Secondary | ICD-10-CM

## 2020-08-15 DIAGNOSIS — N184 Chronic kidney disease, stage 4 (severe): Secondary | ICD-10-CM | POA: Diagnosis present

## 2020-08-15 LAB — FERRITIN: Ferritin: 71 ng/mL (ref 11–307)

## 2020-08-15 LAB — POCT HEMOGLOBIN-HEMACUE: Hemoglobin: 11.7 g/dL — ABNORMAL LOW (ref 12.0–15.0)

## 2020-08-15 LAB — IRON AND TIBC
Iron: 72 ug/dL (ref 28–170)
Saturation Ratios: 22 % (ref 10.4–31.8)
TIBC: 335 ug/dL (ref 250–450)
UIBC: 263 ug/dL

## 2020-08-15 MED ORDER — DARBEPOETIN ALFA 150 MCG/0.3ML IJ SOSY
PREFILLED_SYRINGE | INTRAMUSCULAR | Status: AC
  Administered 2020-08-15: 150 ug via SUBCUTANEOUS
  Filled 2020-08-15: qty 0.3

## 2020-08-15 MED ORDER — DARBEPOETIN ALFA 150 MCG/0.3ML IJ SOSY: 150.0000 ug | PREFILLED_SYRINGE | INTRAMUSCULAR | Status: DC

## 2020-08-22 MED ORDER — BASAGLAR KWIKPEN 100 UNIT/ML ~~LOC~~ SOPN
5.0000 [IU] | PEN_INJECTOR | Freq: Every day | SUBCUTANEOUS | 1 refills | Status: DC
Start: 2020-08-22 — End: 2021-05-22

## 2020-08-22 NOTE — Addendum Note (Signed)
Addended by: Talbot Grumbling on: 08/22/2020 12:44 PM   Modules accepted: Orders

## 2020-08-30 ENCOUNTER — Other Ambulatory Visit: Payer: Self-pay | Admitting: Family Medicine

## 2020-08-30 DIAGNOSIS — R0981 Nasal congestion: Secondary | ICD-10-CM

## 2020-09-12 ENCOUNTER — Encounter (HOSPITAL_COMMUNITY)
Admission: RE | Admit: 2020-09-12 | Discharge: 2020-09-12 | Disposition: A | Discharge status: Home / Self Care | Payer: Medicare (Managed Care) | Source: Ambulatory Visit | Attending: Nephrology | Admitting: Nephrology

## 2020-09-12 ENCOUNTER — Other Ambulatory Visit: Payer: Self-pay

## 2020-09-12 VITALS — BP 171/84 | HR 65 | Temp 98.2°F | Resp 18

## 2020-09-12 DIAGNOSIS — N170 Acute kidney failure with tubular necrosis: Secondary | ICD-10-CM

## 2020-09-12 DIAGNOSIS — N184 Chronic kidney disease, stage 4 (severe): Secondary | ICD-10-CM | POA: Insufficient documentation

## 2020-09-12 DIAGNOSIS — D631 Anemia in chronic kidney disease: Secondary | ICD-10-CM | POA: Diagnosis not present

## 2020-09-12 LAB — IRON AND TIBC
Iron: 44 ug/dL (ref 28–170)
Saturation Ratios: 12 % (ref 10.4–31.8)
TIBC: 358 ug/dL (ref 250–450)
UIBC: 314 ug/dL

## 2020-09-12 LAB — POCT HEMOGLOBIN-HEMACUE: Hemoglobin: 12.4 g/dL (ref 12.0–15.0)

## 2020-09-12 LAB — FERRITIN: Ferritin: 74 ng/mL (ref 11–307)

## 2020-09-12 MED ORDER — DARBEPOETIN ALFA 150 MCG/0.3ML IJ SOSY
PREFILLED_SYRINGE | INTRAMUSCULAR | Status: AC
  Filled 2020-09-12: qty 0.3

## 2020-09-12 MED ORDER — DARBEPOETIN ALFA 150 MCG/0.3ML IJ SOSY: 150.0000 ug | PREFILLED_SYRINGE | INTRAMUSCULAR | Status: DC

## 2020-10-09 ENCOUNTER — Other Ambulatory Visit: Payer: Self-pay | Admitting: Internal Medicine

## 2020-10-10 ENCOUNTER — Encounter (HOSPITAL_COMMUNITY)
Admission: RE | Admit: 2020-10-10 | Discharge: 2020-10-10 | Disposition: A | Discharge status: Home / Self Care | Payer: Medicare (Managed Care) | Source: Ambulatory Visit | Attending: Nephrology | Admitting: Nephrology

## 2020-10-10 ENCOUNTER — Other Ambulatory Visit: Payer: Self-pay

## 2020-10-10 ENCOUNTER — Telehealth: Payer: Self-pay | Admitting: Internal Medicine

## 2020-10-10 VITALS — BP 160/79 | HR 60 | Temp 98.1°F | Resp 16

## 2020-10-10 DIAGNOSIS — N170 Acute kidney failure with tubular necrosis: Secondary | ICD-10-CM

## 2020-10-10 DIAGNOSIS — N184 Chronic kidney disease, stage 4 (severe): Secondary | ICD-10-CM | POA: Diagnosis not present

## 2020-10-10 LAB — POCT HEMOGLOBIN-HEMACUE: Hemoglobin: 10.5 g/dL — ABNORMAL LOW (ref 12.0–15.0)

## 2020-10-10 MED ORDER — DARBEPOETIN ALFA 150 MCG/0.3ML IJ SOSY
150.0000 ug | PREFILLED_SYRINGE | INTRAMUSCULAR | Status: DC
  Administered 2020-10-10: 150 ug via SUBCUTANEOUS

## 2020-10-10 MED ORDER — DARBEPOETIN ALFA 150 MCG/0.3ML IJ SOSY
PREFILLED_SYRINGE | INTRAMUSCULAR | Status: AC
  Filled 2020-10-10: qty 0.3

## 2020-10-10 NOTE — Telephone Encounter (Signed)
Pt returning a phone call. Pt stated that she took her last pill this morning as she takes them on M,W,F. Pt can be reached at [phone]

## 2020-10-10 NOTE — Telephone Encounter (Signed)
Called spoke with patient. Let her know the Refill for 1 month supply sent to preferred pharmacy. Patient verified pharmacy  Order placed.  Nothing further needed at this time.   MR to advise on future refills upon his return to clinic.

## 2020-10-10 NOTE — Telephone Encounter (Signed)
ATC patient need to know how many pills are left. MR is not back until 10/11/20 may need to send to app of the day   Patient called office for refill  Medication name/strength/dose: Bactrim Medication last rx'd: 06/04/20 Quantity and number of refills last rx'd: 12 tablets 3 refills Instructions: take 1 tablet by mouth three times a week   Last OV: 06/04/20 Next OV: not scheduled  MR please advise on refill request  No Known Allergies Current Outpatient Medications on File Prior to Visit  Medication Sig Dispense Refill  . cetirizine (ZYRTEC) 10 MG tablet TAKE 1 TABLET BY MOUTH EVERY DAY 90 tablet 2  . Accu-Chek Softclix Lancets lancets Use as instructed 100 each 12  . albuterol (PROAIR HFA) 108 (90 Base) MCG/ACT inhaler INHALE 2 PUFFS BY MOUTH EVERY 4-6 HOURS 8 g 2  . albuterol (PROVENTIL) (2.5 MG/3ML) 0.083% nebulizer solution Take 3 mLs (2.5 mg total) by nebulization every 6 (six) hours as needed for wheezing or shortness of breath. 150 mL 1  . aspirin 81 MG EC tablet TAKE 1 TABLET BY MOUTH EVERY DAY 32 tablet 12  . azithromycin (ZITHROMAX) 250 MG tablet Take 1 tablet (250 mg total) by mouth as directed. 6 tablet 0  . fluticasone (FLONASE) 50 MCG/ACT nasal spray Place 2 sprays into both nostrils daily. 16 g 2  . glucose blood (ACCU-CHEK GUIDE) test strip Check blood glucose prior to lantus administration 100 each 12  . hydrALAZINE (APRESOLINE) 100 MG tablet Take 100 mg by mouth 3 (three) times daily.    . Insulin Glargine (BASAGLAR KWIKPEN) 100 UNIT/ML Inject 5 Units into the skin at bedtime. 15 mL 1  . labetalol (NORMODYNE) 300 MG tablet Take 300 mg by mouth 2 (two) times daily.    . Lancets Misc. (ACCU-CHEK SOFTCLIX LANCET DEV) KIT Check BG prior to administering insulin 1 kit 3  . LINZESS 72 MCG capsule TAKE 1 CAPSULE (72 MCG TOTAL) BY MOUTH DAILY BEFORE BREAKFAST. (Patient taking differently: 72 mcg. As needed) 30 capsule 0  . pravastatin (PRAVACHOL) 40 MG tablet TAKE 1 TABLET BY  MOUTH EVERY DAY AT BEDTIME FOR CHOLESTEROL 90 tablet 3  . predniSONE (DELTASONE) 10 MG tablet 4 x 1 day 3 x 1 day 2 x 1 day 1 x 1 day 1/2 x 1 day then stop 11 tablet 0  . sodium bicarbonate 650 MG tablet Take 1,300 mg by mouth 2 (two) times daily.    [place] sulfamethoxazole-trimethoprim (BACTRIM DS) 800-160 MG tablet TAKE 1 TABLET BY MOUTH THREE TIMES A WEEK 12 tablet 3  . Tiotropium Bromide Monohydrate (SPIRIVA RESPIMAT) 1.25 MCG/ACT AERS Inhale 2 puffs into the lungs daily. 4 g 6   Current Facility-Administered Medications on File Prior to Visit  Medication Dose Route Frequency Provider Last Rate Last Admin  . Darbepoetin Alfa (ARANESP) 150 MCG/0.3ML injection           . Darbepoetin Alfa (ARANESP) injection 150 mcg  150 mcg Subcutaneous Q28 days Madelon Lips, MD   150 mcg at 10/10/20 1049

## 2020-10-10 NOTE — Telephone Encounter (Signed)
Catherine Aguilar patient took last pill this morning she takes Monday Wednesday Friday . MR off today

## 2020-10-10 NOTE — Telephone Encounter (Signed)
Okay to provide 1 30-day supply.  Additional refills will need to be coordinated by Dr. Chase Caller when he returns back to office  Wyn Quaker, FNP

## 2020-10-20 ENCOUNTER — Other Ambulatory Visit: Payer: Self-pay | Admitting: Family Medicine

## 2020-10-20 DIAGNOSIS — Z1231 Encounter for screening mammogram for malignant neoplasm of breast: Secondary | ICD-10-CM

## 2020-10-24 DIAGNOSIS — I776 Arteritis, unspecified: Secondary | ICD-10-CM | POA: Diagnosis not present

## 2020-10-24 DIAGNOSIS — I129 Hypertensive chronic kidney disease with stage 1 through stage 4 chronic kidney disease, or unspecified chronic kidney disease: Secondary | ICD-10-CM | POA: Diagnosis not present

## 2020-10-24 DIAGNOSIS — D631 Anemia in chronic kidney disease: Secondary | ICD-10-CM | POA: Diagnosis not present

## 2020-10-24 DIAGNOSIS — M3131 Wegener's granulomatosis with renal involvement: Secondary | ICD-10-CM | POA: Diagnosis not present

## 2020-10-24 DIAGNOSIS — D509 Iron deficiency anemia, unspecified: Secondary | ICD-10-CM | POA: Diagnosis not present

## 2020-10-24 DIAGNOSIS — R6889 Other general symptoms and signs: Secondary | ICD-10-CM | POA: Diagnosis not present

## 2020-10-24 DIAGNOSIS — N184 Chronic kidney disease, stage 4 (severe): Secondary | ICD-10-CM | POA: Diagnosis not present

## 2020-10-27 ENCOUNTER — Telehealth: Payer: Self-pay | Admitting: Internal Medicine

## 2020-10-27 NOTE — Telephone Encounter (Signed)
Forwarding to Dr Chase Caller to call Dr Hollie Salk- [phone]

## 2020-10-28 NOTE — Telephone Encounter (Signed)
Discussed with Dr. Hollie Salk of renal.  She says the patient's kidney function is normal.  Blood labs are also okay.  Patient is feeling okay.  However the ANCA vasculitis antibody MPO was going up significantly.  It has been 1 year since patient's last dose of Rituxan.  She feels that the disease is going to get active.  She is recommending another dose of Rituxan within the next 1 month  Plan -Give follow-up to see myself or Tammy Parrott or Geraldo Pitter within the next 1-2 weeks -> then we can set up Rituxan infusion either at [facility] infusion center or at [facility] infusion center   Dr. Hollie Salk wanted [place] to oversee the infusion

## 2020-10-28 NOTE — Telephone Encounter (Signed)
Called spoke with patient. She can only come on March 4th, so appointment set up with Tammy Parrett at 10am Nothing further needed at this time.

## 2020-11-04 ENCOUNTER — Other Ambulatory Visit: Payer: Self-pay

## 2020-11-04 DIAGNOSIS — Z794 Long term (current) use of insulin: Secondary | ICD-10-CM

## 2020-11-05 ENCOUNTER — Other Ambulatory Visit: Payer: Self-pay | Admitting: Pulmonary Disease

## 2020-11-06 MED ORDER — ACCU-CHEK SOFTCLIX LANCETS MISC
12 refills | Status: DC
Start: 2020-11-06 — End: 2020-12-15

## 2020-11-06 MED ORDER — PRAVASTATIN SODIUM 40 MG PO TABS: ORAL_TABLET | ORAL | 3 refills | Status: DC

## 2020-11-06 MED ORDER — ACCU-CHEK SOFTCLIX LANCET DEV KIT
PACK | 3 refills | Status: DC
Start: 2020-11-06 — End: 2023-04-18

## 2020-11-06 MED ORDER — ACCU-CHEK GUIDE VI STRP: ORAL_STRIP | 12 refills | Status: DC

## 2020-11-06 MED ORDER — SODIUM BICARBONATE 650 MG PO TABS: 1300.0000 mg | ORAL_TABLET | Freq: Two times a day (BID) | ORAL | 1 refills | Status: AC

## 2020-11-06 MED ORDER — ALBUTEROL SULFATE (2.5 MG/3ML) 0.083% IN NEBU: 2.5000 mg | INHALATION_SOLUTION | Freq: Four times a day (QID) | RESPIRATORY_TRACT | 1 refills | Status: AC | PRN

## 2020-11-07 ENCOUNTER — Other Ambulatory Visit: Payer: Self-pay

## 2020-11-07 ENCOUNTER — Ambulatory Visit (HOSPITAL_COMMUNITY)
Admission: RE | Admit: 2020-11-07 | Discharge: 2020-11-07 | Disposition: A | Discharge status: Home / Self Care | Payer: Medicare HMO | Source: Ambulatory Visit | Attending: Nephrology | Admitting: Nephrology

## 2020-11-07 VITALS — BP 145/83 | HR 65 | Temp 97.9°F

## 2020-11-07 DIAGNOSIS — N184 Chronic kidney disease, stage 4 (severe): Secondary | ICD-10-CM | POA: Insufficient documentation

## 2020-11-07 DIAGNOSIS — N170 Acute kidney failure with tubular necrosis: Secondary | ICD-10-CM

## 2020-11-07 DIAGNOSIS — D631 Anemia in chronic kidney disease: Secondary | ICD-10-CM | POA: Insufficient documentation

## 2020-11-07 DIAGNOSIS — R6889 Other general symptoms and signs: Secondary | ICD-10-CM | POA: Diagnosis not present

## 2020-11-07 LAB — FERRITIN: Ferritin: 65 ng/mL (ref 11–307)

## 2020-11-07 LAB — IRON AND TIBC
Iron: 55 ug/dL (ref 28–170)
Saturation Ratios: 17 % (ref 10.4–31.8)
TIBC: 329 ug/dL (ref 250–450)
UIBC: 274 ug/dL

## 2020-11-07 LAB — POCT HEMOGLOBIN-HEMACUE: Hemoglobin: 11.6 g/dL — ABNORMAL LOW (ref 12.0–15.0)

## 2020-11-07 MED ORDER — DARBEPOETIN ALFA 150 MCG/0.3ML IJ SOSY
150.0000 ug | PREFILLED_SYRINGE | INTRAMUSCULAR | Status: DC
  Administered 2020-11-07: 150 ug via SUBCUTANEOUS

## 2020-11-07 MED ORDER — DARBEPOETIN ALFA 150 MCG/0.3ML IJ SOSY
PREFILLED_SYRINGE | INTRAMUSCULAR | Status: AC
  Filled 2020-11-07: qty 0.3

## 2020-11-09 ENCOUNTER — Telehealth: Payer: Self-pay

## 2020-11-09 NOTE — Telephone Encounter (Signed)
[place] calls nurse line requesting a refill on Accu Chek Guide Level 1 and 2 solution. I could not find this on her med list or in epic in orders. Will forward to PCP for advisement. Please send to [place] if appropriate.

## 2020-11-10 ENCOUNTER — Other Ambulatory Visit: Payer: Self-pay | Admitting: Family Medicine

## 2020-11-10 NOTE — Telephone Encounter (Signed)
On behalf on Dr. Ouida Sills called in verbal order to [facility] for Accu-chek Guide Level 1 and Level 2 control solution with no refills.

## 2020-11-10 NOTE — Telephone Encounter (Signed)
I also tried to add it as an order and it is not coming up. Do you want me to call it in to the pharmacy on your behalf? It is just a control solution to check accuracy of meter so it should not matter if it's not on her epic list as the patient isn't consuming it for therapy.

## 2020-11-11 ENCOUNTER — Ambulatory Visit (INDEPENDENT_AMBULATORY_CARE_PROVIDER_SITE_OTHER): Payer: Medicare HMO

## 2020-11-11 ENCOUNTER — Other Ambulatory Visit: Payer: Self-pay

## 2020-11-11 ENCOUNTER — Telehealth: Payer: Self-pay | Admitting: Internal Medicine

## 2020-11-11 ENCOUNTER — Ambulatory Visit (INDEPENDENT_AMBULATORY_CARE_PROVIDER_SITE_OTHER): Payer: Medicare HMO | Admitting: Adult Health

## 2020-11-11 ENCOUNTER — Encounter: Payer: Self-pay | Admitting: Adult Health

## 2020-11-11 VITALS — BP 138/86 | HR 72 | Temp 98.0°F | Ht 67.0 in | Wt 223.0 lb

## 2020-11-11 DIAGNOSIS — I517 Cardiomegaly: Secondary | ICD-10-CM | POA: Diagnosis not present

## 2020-11-11 DIAGNOSIS — J310 Chronic rhinitis: Secondary | ICD-10-CM | POA: Insufficient documentation

## 2020-11-11 DIAGNOSIS — Z23 Encounter for immunization: Secondary | ICD-10-CM

## 2020-11-11 DIAGNOSIS — J439 Emphysema, unspecified: Secondary | ICD-10-CM

## 2020-11-11 DIAGNOSIS — R6889 Other general symptoms and signs: Secondary | ICD-10-CM | POA: Diagnosis not present

## 2020-11-11 DIAGNOSIS — M318 Other specified necrotizing vasculopathies: Secondary | ICD-10-CM

## 2020-11-11 MED ORDER — PREDNISONE 20 MG PO TABS: 20.0000 mg | ORAL_TABLET | Freq: Every day | ORAL | 0 refills | Status: DC

## 2020-11-11 MED ORDER — BENZONATATE 200 MG PO CAPS: 200.0000 mg | ORAL_CAPSULE | Freq: Three times a day (TID) | ORAL | 1 refills | Status: AC | PRN

## 2020-11-11 MED ORDER — DOXYCYCLINE HYCLATE 100 MG PO TABS: 100.0000 mg | ORAL_TABLET | Freq: Two times a day (BID) | ORAL | 0 refills | Status: DC

## 2020-11-11 NOTE — Telephone Encounter (Signed)
Patient will need to call [facility] Stay to get the infusion scheduled. Phone number that pt needs to call is [phone].

## 2020-11-11 NOTE — Assessment & Plan Note (Signed)
Mild flare with chronic bronchitis - former smoker  Check PFT on return  Check cxr today .  tx w/ abx and steroids   Plan  Patient Instructions  Mucinex DM Twice daily  As needed  Cough/congestion.  Saline nasal rinses As needed   Prednisone 20mg  daily for 5 days.  Doxycycline 100mg  Twice daily  For 7 days .  Tessalon Three times a day  As needed  Cough  Chest xray today  Follow up with Dr. Chase Caller in 6 months with PFT  Please contact office for sooner follow up if symptoms do not improve or worsen or seek emergency care

## 2020-11-11 NOTE — Assessment & Plan Note (Signed)
Add saline nasal rinses  Cont on flonase and zyrtec

## 2020-11-11 NOTE — Progress Notes (Signed)
'@Patient'  ID: Catherine Aguilar, female    DOB: Nov 24, 1955, 65 y.o.   MRN: [id number]  Chief Complaint  Patient presents with  . Follow-up    Referring provider: Daisy Floro, DO  HPI: 65 year old female former smoker followed for systemic vasculitis syndrome and emphysema Polysubstance abuse history Critical illness with life-threatening alveolar hemorrhage/hemoptysis and acute renal failure spring 2018-Treated with 4 doses of Rituxan last dose May 2018 Vasculitis relapse 2019 required repeat of right toxin from August 2019 to February 2021 Medical history significant for diabetes, CKD , Anemia   TEST/EVENTS :   Results for Catherine Aguilar (MRN [id number]) as of 02/16/2020 09:19  Ref. Range 05/27/2019 08:46  December 10, 2019 done by renal  Myeloperoxidase Abs Latest Units: AI 2.3 (H)  50.5  Serine Protease 3 Latest Units: AI <1.0  less than 3.5  P-ANCA    1: 640  C ANCA    less than 1: 20   High-resolution CT chest November 2019 - for ILD, small parenchymal bands scattered in the mid to lower lungs bilaterally left greater than right compatible with mild postinfectious/postinflammatory scarring, mild emphysema  11/11/2020 Follow up : Systemic vasculitis syndrome and emphysema Patient returns for a 80-monthfollow-up.  Patient says overall breathing has been doing the same. Has ongoing sinus drainage , congestion and cough. Last few months , increased in nature.  Last Rituxan was 10/2019 . Follows with Nephrology , recently seen . Is fully vaccinated for Covid 19. No hemoptysis. No chest pain , orthopnea or edema. No feve.r  Remains on Spiriva daily . Not on O2 . O2 sats 96% on room air. Has flonase and Zyrtec but not using every day .   No Known Allergies  Immunization History  Administered Date(s) Administered  . Influenza Split 07/02/2012  . Influenza,inj,Quad PF,6+ Mos 05/03/2017, 05/13/2018, 06/08/2019  . Pneumococcal Polysaccharide-23 07/02/2012, 11/22/2017, 05/30/2020     Past Medical History:  Diagnosis Date  . Acute renal failure (ARF) ([facility] 12/08/2016  . Acute respiratory failure with hypoxia ([facility] 12/08/2016  . AKI (acute kidney injury) ([facility] 12/08/2016  . Allergy   . Anemia   . Asthma with COPD (chronic obstructive pulmonary disease) ([facility] 11/25/2017  . CAP (community acquired pneumonia) 12/08/2016  . Chronic kidney disease   . CKD (chronic kidney disease), stage IV ([facility] 02/11/2017  . 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 CVA (cerebral vascular accident) ([facility] 07/03/2012  . Diffuse pulmonary alveolar hemorrhage   . Edema of right lower extremity 03/25/2017  . Essential hypertension 12/08/2016  . Hemoptysis 12/08/2016  . High cholesterol   . HLD (hyperlipidemia) 12/08/2016  . Hx of adenomatous polyp of colon 04/16/2017  . Hypertension    stopped taking medications 10 years ago  . Hypokalemia 07/02/2012  . Intra-alveolar hemorrhage 12/08/2016  . Left facial numbness 07/02/2012  . Sepsis ([facility] 12/08/2016  . Shortness of breath 12/02/2017  . Stroke ([facility]   . Substance abuse ([facility] 12/08/2016  . Systemic vasculitis syndrome ([facility] 12/08/2016  . Tobacco abuse 12/08/2016  . Type 2 diabetes mellitus without complication, with long-term current use of insulin ([facility] 02/11/2017  . Wegener's granulomatosis with renal involvement ([facility] 12/08/2016    Tobacco History: Social History   Tobacco Use  Smoking Status Former Smoker  . Packs/day: 0.20  . Years: 49.00  . Pack years: 9.80  . Types: Cigarettes  . Start date: 09/10/1968  . Quit date: 03/25/2019  . Years since quitting: 1.6  Smokeless Tobacco Never Used   Counseling given:  Not Answered   Outpatient Medications Prior to Visit  Medication Sig Dispense Refill  . Accu-Chek Softclix Lancets lancets Use as instructed 100 each 12  . albuterol (PROAIR HFA) 108 (90 Base) MCG/ACT inhaler INHALE 2 PUFFS BY MOUTH EVERY 4-6 HOURS 8 g 2  . albuterol (PROVENTIL) (2.5 MG/3ML) 0.083% nebulizer solution Take 3 mLs (2.5 mg  total) by nebulization every 6 (six) hours as needed for wheezing or shortness of breath. 150 mL 1  . aspirin 81 MG EC tablet TAKE 1 TABLET BY MOUTH EVERY DAY 32 tablet 12  . cetirizine (ZYRTEC) 10 MG tablet TAKE 1 TABLET BY MOUTH EVERY DAY 90 tablet 2  . fluticasone (FLONASE) 50 MCG/ACT nasal spray Place 2 sprays into both nostrils daily. 16 g 2  . glucose blood (ACCU-CHEK GUIDE) test strip Check blood glucose prior to lantus administration 100 each 12  . hydrALAZINE (APRESOLINE) 100 MG tablet Take 100 mg by mouth 3 (three) times daily.    . Insulin Glargine (BASAGLAR KWIKPEN) 100 UNIT/ML Inject 5 Units into the skin at bedtime. 15 mL 1  . labetalol (NORMODYNE) 300 MG tablet Take 300 mg by mouth 2 (two) times daily.    . Lancets Misc. (ACCU-CHEK SOFTCLIX LANCET DEV) KIT Check BG prior to administering insulin 1 kit 3  . LINZESS 72 MCG capsule TAKE 1 CAPSULE (72 MCG TOTAL) BY MOUTH DAILY BEFORE BREAKFAST. (Patient taking differently: 72 mcg. As needed) 30 capsule 0  . pravastatin (PRAVACHOL) 40 MG tablet TAKE 1 TABLET BY MOUTH EVERY DAY AT BEDTIME FOR CHOLESTEROL 90 tablet 3  . sodium bicarbonate 650 MG tablet Take 2 tablets (1,300 mg total) by mouth 2 (two) times daily. 60 tablet 1  . sulfamethoxazole-trimethoprim (BACTRIM DS) 800-160 MG tablet TAKE 1 TABLET BY MOUTH THREE TIMES A WEEK. 12 tablet 0  . Tiotropium Bromide Monohydrate (SPIRIVA RESPIMAT) 1.25 MCG/ACT AERS Inhale 2 puffs into the lungs daily. 4 g 6  . azithromycin (ZITHROMAX) 250 MG tablet Take 1 tablet (250 mg total) by mouth as directed. 6 tablet 0  . predniSONE (DELTASONE) 10 MG tablet 4 x 1 day 3 x 1 day 2 x 1 day 1 x 1 day 1/2 x 1 day then stop (Patient not taking: Reported on 11/11/2020) 11 tablet 0   No facility-administered medications prior to visit.     Review of Systems:   Constitutional:   No  weight loss, night sweats,  Fevers, chills, + fatigue, or  lassitude.  HEENT:   No headaches,  Difficulty swallowing,   Tooth/dental problems, or  Sore throat,                No sneezing, itching, ear ache, +nasal congestion, post nasal drip,   CV:  No chest pain,  Orthopnea, PND, swelling in lower extremities, anasarca, dizziness, palpitations, syncope.   GI  No heartburn, indigestion, abdominal pain, nausea, vomiting, diarrhea, change in bowel habits, loss of appetite, bloody stools.   Resp:   No chest wall deformity  Skin: no rash or lesions.  GU: no dysuria, change in color of urine, no urgency or frequency.  No flank pain, no hematuria   MS:  No joint pain or swelling.  No decreased range of motion.  No back pain.    Physical Exam  BP 138/86 (BP Location: Left Arm, Cuff Size: Normal)   Pulse 72   Temp 98 F (36.7 C)   Ht '5\' 7"'  (1.702 m)   Wt 223 lb (101.2 kg)  SpO2 96%   BMI 34.93 kg/m   GEN: A/Ox3; pleasant , NAD, well nourished    HEENT:  [place]/AT,  l, NOSE-clear, THROAT-clear, no lesions, no postnasal drip or exudate noted.   NECK:  Supple w/ fair ROM; no JVD; normal carotid impulses w/o bruits; no thyromegaly or nodules palpated; no lymphadenopathy.    RESP  Clear  P & A; w/o, wheezes/ rales/ or rhonchi. no accessory muscle use, no dullness to percussion  CARD:  RRR, no m/r/g, no peripheral edema, pulses intact, no cyanosis or clubbing.  GI:   Soft & nt; nml bowel sounds; no organomegaly or masses detected.   Musco: Warm bil, no deformities or joint swelling noted.   Neuro: alert, no focal deficits noted.    Skin: Warm, no lesions or rashes    Lab Results:  CBC    Component Value Date/Time   WBC 5.9 06/07/2020 1404   RBC 4.17 06/07/2020 1404   HGB 11.6 (L) 11/07/2020 1210   HGB 6.6 (L) 12/08/2016 1520   HCT 34.3 (L) 06/07/2020 1404   PLT 253.0 06/07/2020 1404   MCV 82.3 06/07/2020 1404   MCH 26.7 09/19/2017 1100   MCHC 32.5 06/07/2020 1404   RDW 15.6 (H) 06/07/2020 1404   LYMPHSABS 1.8 02/16/2020 0919   MONOABS 0.7 02/16/2020 0919   EOSABS 0.1 02/16/2020 0919    BASOSABS 0.0 02/16/2020 0919    BMET    Component Value Date/Time   NA 136 06/07/2020 1404   K 4.1 06/07/2020 1404   CL 105 06/07/2020 1404   CO2 23 06/07/2020 1404   GLUCOSE 121 (H) 06/07/2020 1404   BUN 50 (H) 06/07/2020 1404   CREATININE 2.74 (H) 06/07/2020 1404   CALCIUM 9.5 06/07/2020 1404   GFRNONAA 15 (L) 09/02/2017 2149   GFRAA 17 (L) 09/02/2017 2149    BNP    Component Value Date/Time   BNP 27.6 09/02/2017 2149    ProBNP No results found for: PROBNP  Imaging: No results found.    No flowsheet data found.  Lab Results  Component Value Date   NITRICOXIDE 8 01/21/2018        Assessment & Plan:   Emphysema lung (HCC) Mild flare with chronic bronchitis - former smoker  Check PFT on return  Check cxr today .  tx w/ abx and steroids   Plan  Patient Instructions  Mucinex DM Twice daily  As needed  Cough/congestion.  Saline nasal rinses As needed   Prednisone 29m daily for 5 days.  Doxycycline 1042mTwice daily  For 7 days .  Tessalon Three times a day  As needed  Cough  Chest xray today  Follow up with Dr. RaChase Callern 6 months with PFT  Please contact office for sooner follow up if symptoms do not improve or worsen or seek emergency care       Systemic vasculitis syndrome ([facility] with follow up with Nephrology and current regimen   Chronic rhinitis Add saline nasal rinses  Cont on flonase and zyrtec       TaRexene EdisonNP 11/11/2020

## 2020-11-11 NOTE — Telephone Encounter (Signed)
Raquel Sarna please advise. Thanks

## 2020-11-11 NOTE — Patient Instructions (Addendum)
Mucinex DM Twice daily  As needed  Cough/congestion.  Saline nasal rinses As needed   Prednisone 20mg  daily for 5 days.  Doxycycline 100mg  Twice daily  For 7 days .  Tessalon Three times a day  As needed  Cough  Chest xray today  Follow up with Dr. Chase Caller in 6 months with PFT  Please contact office for sooner follow up if symptoms do not improve or worsen or seek emergency care

## 2020-11-11 NOTE — Assessment & Plan Note (Addendum)
Cont with follow up with Nephrology and current regimen   Late add :  Case discussed with Dr. Chase Caller in detail. Dr. Chase Caller discussed with Nephrology  The following :  "Discussed with Dr. Hollie Salk of renal.  She says the patient's kidney function is normal.  Blood labs are also okay.  Patient is feeling okay.  However the ANCA vasculitis antibody MPO was going up significantly.  It has been 1 year since patient's last dose of Rituxan.  She feels that the disease is going to get active.  She is recommending another dose of Rituxan within the next 1 month. "   Recommend setting up Rituxan at Infusion center per protocol .  Patient is to continue Bactrim DM 3 x /wk   Set up follow up with Dr. Chase Caller in 4-6 weeks and As needed   Please contact office for sooner follow up if symptoms do not improve or worsen or seek emergency care

## 2020-11-11 NOTE — Telephone Encounter (Signed)
I called and spoke with pt and she stated that [place] at Short stay said that we have to send over paper work to get this going and then the pt calls over to schedule. Do you know anything about this?

## 2020-11-14 NOTE — Telephone Encounter (Signed)
Per phone encounter from 2/17, MR spoke with pt's renal MD and after this discussion with pt's kidney function being normal and since it had been over a year since her last Rituxan infusion, MR wanted pt to be scheduled for an appt either with Tammy, Beth, or himself so we could then get her set up for her next Rituxan infusion.  Pt had OV with Tammy 11/11/20. The physician order form was filled out and has been signed by TP and has been faxed to [facility] at [facility].  Attempted to call pt but unable to reach. Left message for her to return call so we can let her know that this had been faxed and that she should be able to call and get her next Rituxan infusion scheduled if she had not done so already.

## 2020-11-14 NOTE — Progress Notes (Signed)
ATC x1, reached a fast busy signal.  Will try again another time.

## 2020-11-14 NOTE — Telephone Encounter (Signed)
Received a message from TP about the Rituxan that before pt calls to get appt scheduled, she wanted to do some investigating with the dose as she would need to review pt's last renal visit prior to making sure that the dose does not need to change.  I called Laverne with [facility] telling her this info and she verbalized understanding. Stated to her that we might be changing dose and she verbalized understanding.  Before I return pt's call about this, sending this encounter to TP for her to review.

## 2020-11-15 MED ORDER — SULFAMETHOXAZOLE-TRIMETHOPRIM 800-160 MG PO TABS: 1.0000 | ORAL_TABLET | ORAL | 5 refills | Status: DC

## 2020-11-15 NOTE — Addendum Note (Signed)
Addended by: Melvenia Needles on: 11/15/2020 01:35 PM   Modules accepted: Orders

## 2020-11-15 NOTE — Telephone Encounter (Signed)
Called and spoke with pt and have gotten her scheduled for a follow up with MR in 4 weeks. Nothing further needed.  Called and spoke with Laverne from [facility] letting her know that the dose for Rituxan is still going to be 800 (375mg /m2). Laverne verbalized understanding and stated she would call pt to get infusion scheduled. Nothing further needed.

## 2020-11-15 NOTE — Progress Notes (Signed)
Called and spoke with patient, advised of results/recommendations per Tammy Parrett NP.  She verbalized understanding.  Nothing further needed.

## 2020-11-15 NOTE — Telephone Encounter (Signed)
Per Dr. Chase Caller : Discussed with Dr. Hollie Salk of renal.  She says the patient's kidney function is normal.  Blood labs are also okay.  Patient is feeling okay.  However the ANCA vasculitis antibody MPO was going up significantly.  It has been 1 year since patient's last dose of Rituxan.  She feels that the disease is going to get active.  She is recommending another dose of Rituxan within the next 1 month  Case discussed with Dr. Chase Caller.  Dr. Chase Caller spoke with nephrology.  Recent lab work from renal showed MPO antibody is trending up indicative of active vasculitis.  With recommendations to set up a Rituxan infusion .  Advised to use Rituxan protocol. Reach out to pharmacy [facility] for protocol questions.   We will set up as previously done last in February 2021. Renal notes from October 24, 2020 reviewed and showed MPO antibody greater than 100 P ANCA 1: 640 ESR 40 serum creatinine 2.67 GFR 21 hemoglobin 11.8  Patient will be set up for Rituxan  infusion per protocol with Rituxan 800 mg (375 mg/M2 ) IV x1. Predose with Tylenol and Benadryl . Continue on Bactrim DS for PJP proph. 3d/wk  Patient is to follow-up in 4 weeks with Dr. Chase Caller in the office for a follow-up visit.   Spoke to patient who is aware and awaiting infusion set up  We will copy Dr. Chase Caller and [place] please make follow up with Dr. Chase Caller in 4 weeks

## 2020-11-18 ENCOUNTER — Other Ambulatory Visit (HOSPITAL_COMMUNITY): Payer: Self-pay

## 2020-11-21 ENCOUNTER — Encounter (HOSPITAL_COMMUNITY)
Admission: RE | Admit: 2020-11-21 | Discharge: 2020-11-21 | Disposition: A | Discharge status: Home / Self Care | Payer: Medicare HMO | Source: Ambulatory Visit | Attending: Nephrology | Admitting: Nephrology

## 2020-11-21 DIAGNOSIS — M318 Other specified necrotizing vasculopathies: Secondary | ICD-10-CM | POA: Diagnosis not present

## 2020-11-21 DIAGNOSIS — N170 Acute kidney failure with tubular necrosis: Secondary | ICD-10-CM | POA: Insufficient documentation

## 2020-11-21 DIAGNOSIS — N184 Chronic kidney disease, stage 4 (severe): Secondary | ICD-10-CM | POA: Diagnosis not present

## 2020-11-21 DIAGNOSIS — D631 Anemia in chronic kidney disease: Secondary | ICD-10-CM | POA: Insufficient documentation

## 2020-11-21 DIAGNOSIS — R6889 Other general symptoms and signs: Secondary | ICD-10-CM | POA: Diagnosis not present

## 2020-11-21 MED ORDER — SODIUM CHLORIDE 0.9 % IV SOLN
800.0000 mg | Freq: Once | INTRAVENOUS | Status: AC
  Administered 2020-11-21: 800 mg via INTRAVENOUS
  Filled 2020-11-21: qty 50

## 2020-11-21 MED ORDER — ACETAMINOPHEN 325 MG PO TABS: 650.0000 mg | ORAL_TABLET | Freq: Once | ORAL | Status: AC

## 2020-11-21 MED ORDER — DIPHENHYDRAMINE HCL 25 MG PO CAPS
ORAL_CAPSULE | ORAL | Status: AC
  Administered 2020-11-21: 50 mg via ORAL
  Filled 2020-11-21: qty 2

## 2020-11-21 MED ORDER — DIPHENHYDRAMINE HCL 25 MG PO CAPS: 50.0000 mg | ORAL_CAPSULE | Freq: Once | ORAL | Status: AC

## 2020-11-21 MED ORDER — ACETAMINOPHEN 325 MG PO TABS
ORAL_TABLET | ORAL | Status: AC
  Administered 2020-11-21: 650 mg via ORAL
  Filled 2020-11-21: qty 2

## 2020-12-05 ENCOUNTER — Encounter (HOSPITAL_COMMUNITY)
Admission: RE | Admit: 2020-12-05 | Discharge: 2020-12-05 | Disposition: A | Discharge status: Home / Self Care | Payer: Medicare HMO | Source: Ambulatory Visit | Attending: Nephrology | Admitting: Nephrology

## 2020-12-05 ENCOUNTER — Other Ambulatory Visit: Payer: Self-pay

## 2020-12-05 VITALS — BP 164/84 | HR 63

## 2020-12-05 DIAGNOSIS — D631 Anemia in chronic kidney disease: Secondary | ICD-10-CM | POA: Diagnosis not present

## 2020-12-05 DIAGNOSIS — N170 Acute kidney failure with tubular necrosis: Secondary | ICD-10-CM | POA: Diagnosis not present

## 2020-12-05 DIAGNOSIS — N184 Chronic kidney disease, stage 4 (severe): Secondary | ICD-10-CM | POA: Diagnosis not present

## 2020-12-05 DIAGNOSIS — R6889 Other general symptoms and signs: Secondary | ICD-10-CM | POA: Diagnosis not present

## 2020-12-05 DIAGNOSIS — M318 Other specified necrotizing vasculopathies: Secondary | ICD-10-CM | POA: Diagnosis not present

## 2020-12-05 LAB — FERRITIN: Ferritin: 109 ng/mL (ref 11–307)

## 2020-12-05 LAB — IRON AND TIBC
Iron: 59 ug/dL (ref 28–170)
Saturation Ratios: 17 % (ref 10.4–31.8)
TIBC: 346 ug/dL (ref 250–450)
UIBC: 287 ug/dL

## 2020-12-05 LAB — POCT HEMOGLOBIN-HEMACUE: Hemoglobin: 12.2 g/dL (ref 12.0–15.0)

## 2020-12-05 MED ORDER — DARBEPOETIN ALFA 150 MCG/0.3ML IJ SOSY: 150.0000 ug | PREFILLED_SYRINGE | INTRAMUSCULAR | Status: DC

## 2020-12-05 MED ORDER — DARBEPOETIN ALFA 150 MCG/0.3ML IJ SOSY
PREFILLED_SYRINGE | INTRAMUSCULAR | Status: AC
  Filled 2020-12-05: qty 0.3

## 2020-12-09 ENCOUNTER — Ambulatory Visit: Payer: Medicare (Managed Care)

## 2020-12-15 ENCOUNTER — Other Ambulatory Visit: Payer: Self-pay

## 2020-12-15 ENCOUNTER — Other Ambulatory Visit: Payer: Self-pay | Admitting: Family Medicine

## 2020-12-15 DIAGNOSIS — E119 Type 2 diabetes mellitus without complications: Secondary | ICD-10-CM

## 2020-12-15 DIAGNOSIS — Z794 Long term (current) use of insulin: Secondary | ICD-10-CM

## 2020-12-21 ENCOUNTER — Ambulatory Visit: Payer: Medicare HMO | Admitting: Internal Medicine

## 2020-12-30 ENCOUNTER — Telehealth: Payer: Self-pay

## 2020-12-30 NOTE — Telephone Encounter (Signed)
Received phone call from [place] regarding patient's lancets and lancing devices. Pharmacist reports that the rx was not signed or dated. Gave verbal authorization per diabetes protocol for pharmacist to fill 90 day supply of test strips with 3 refills and 1 lancing device with 0 refills.   Talbot Grumbling, RN

## 2021-01-02 ENCOUNTER — Ambulatory Visit (HOSPITAL_COMMUNITY)
Admission: RE | Admit: 2021-01-02 | Discharge: 2021-01-02 | Disposition: A | Discharge status: Home / Self Care | Payer: Medicare HMO | Source: Ambulatory Visit | Attending: Nephrology | Admitting: Nephrology

## 2021-01-02 ENCOUNTER — Other Ambulatory Visit: Payer: Self-pay

## 2021-01-02 VITALS — BP 168/81 | HR 67 | Temp 98.3°F | Resp 18

## 2021-01-02 DIAGNOSIS — N184 Chronic kidney disease, stage 4 (severe): Secondary | ICD-10-CM | POA: Diagnosis not present

## 2021-01-02 DIAGNOSIS — R6889 Other general symptoms and signs: Secondary | ICD-10-CM | POA: Diagnosis not present

## 2021-01-02 DIAGNOSIS — D631 Anemia in chronic kidney disease: Secondary | ICD-10-CM | POA: Diagnosis not present

## 2021-01-02 DIAGNOSIS — N170 Acute kidney failure with tubular necrosis: Secondary | ICD-10-CM

## 2021-01-02 LAB — FERRITIN: Ferritin: 89 ng/mL (ref 11–307)

## 2021-01-02 LAB — IRON AND TIBC
Iron: 56 ug/dL (ref 28–170)
Saturation Ratios: 16 % (ref 10.4–31.8)
TIBC: 343 ug/dL (ref 250–450)
UIBC: 287 ug/dL

## 2021-01-02 LAB — POCT HEMOGLOBIN-HEMACUE: Hemoglobin: 11.1 g/dL — ABNORMAL LOW (ref 12.0–15.0)

## 2021-01-02 MED ORDER — DARBEPOETIN ALFA 150 MCG/0.3ML IJ SOSY
PREFILLED_SYRINGE | INTRAMUSCULAR | Status: AC
  Filled 2021-01-02: qty 0.3

## 2021-01-02 MED ORDER — DARBEPOETIN ALFA 150 MCG/0.3ML IJ SOSY
150.0000 ug | PREFILLED_SYRINGE | INTRAMUSCULAR | Status: DC
  Administered 2021-01-02: 150 ug via SUBCUTANEOUS

## 2021-01-16 ENCOUNTER — Ambulatory Visit
Admission: RE | Admit: 2021-01-16 | Discharge: 2021-01-16 | Disposition: A | Discharge status: Home / Self Care | Payer: Medicare HMO | Source: Ambulatory Visit | Attending: *Deleted | Admitting: *Deleted

## 2021-01-16 ENCOUNTER — Other Ambulatory Visit: Payer: Self-pay

## 2021-01-16 DIAGNOSIS — Z1231 Encounter for screening mammogram for malignant neoplasm of breast: Secondary | ICD-10-CM

## 2021-01-16 DIAGNOSIS — R6889 Other general symptoms and signs: Secondary | ICD-10-CM | POA: Diagnosis not present

## 2021-01-24 ENCOUNTER — Ambulatory Visit: Payer: Medicare HMO | Admitting: Internal Medicine

## 2021-01-30 ENCOUNTER — Encounter (HOSPITAL_COMMUNITY): Payer: Medicare HMO

## 2021-01-30 ENCOUNTER — Ambulatory Visit (INDEPENDENT_AMBULATORY_CARE_PROVIDER_SITE_OTHER): Payer: Medicare HMO | Admitting: Internal Medicine

## 2021-01-30 ENCOUNTER — Encounter: Payer: Self-pay | Admitting: Internal Medicine

## 2021-01-30 ENCOUNTER — Other Ambulatory Visit: Payer: Self-pay

## 2021-01-30 VITALS — BP 120/78 | HR 55 | Temp 97.5°F | Ht 67.0 in | Wt 218.6 lb

## 2021-01-30 DIAGNOSIS — M318 Other specified necrotizing vasculopathies: Secondary | ICD-10-CM | POA: Diagnosis not present

## 2021-01-30 DIAGNOSIS — J439 Emphysema, unspecified: Secondary | ICD-10-CM | POA: Diagnosis not present

## 2021-01-30 DIAGNOSIS — R6889 Other general symptoms and signs: Secondary | ICD-10-CM | POA: Diagnosis not present

## 2021-01-30 DIAGNOSIS — D849 Immunodeficiency, unspecified: Secondary | ICD-10-CM

## 2021-01-30 LAB — CBC WITH DIFFERENTIAL/PLATELET
Basophils Absolute: 0 10*3/uL (ref 0.0–0.1)
Basophils Relative: 0.7 % (ref 0.0–3.0)
Eosinophils Absolute: 0.2 10*3/uL (ref 0.0–0.7)
Eosinophils Relative: 2.5 % (ref 0.0–5.0)
HCT: 37 % (ref 36.0–46.0)
Hemoglobin: 11.7 g/dL — ABNORMAL LOW (ref 12.0–15.0)
Lymphocytes Relative: 29.2 % (ref 12.0–46.0)
Lymphs Abs: 2 10*3/uL (ref 0.7–4.0)
MCHC: 31.6 g/dL (ref 30.0–36.0)
MCV: 81.3 fl (ref 78.0–100.0)
Monocytes Absolute: 0.6 10*3/uL (ref 0.1–1.0)
Monocytes Relative: 8.6 % (ref 3.0–12.0)
Neutro Abs: 4 10*3/uL (ref 1.4–7.7)
Neutrophils Relative %: 59 % (ref 43.0–77.0)
Platelets: 257 10*3/uL (ref 150.0–400.0)
RBC: 4.55 Mil/uL (ref 3.87–5.11)
RDW: 16.7 % — ABNORMAL HIGH (ref 11.5–15.5)
WBC: 6.7 10*3/uL (ref 4.0–10.5)

## 2021-01-30 LAB — SARS-COV-2 IGG: SARS-COV-2 IgG: 1.1

## 2021-01-30 MED ORDER — SPIRIVA RESPIMAT 1.25 MCG/ACT IN AERS: 2.0000 | INHALATION_SPRAY | Freq: Every day | RESPIRATORY_TRACT | 0 refills | Status: DC

## 2021-01-30 NOTE — Progress Notes (Signed)
OV 05/26/2018  Subjective:  Patient ID: Catherine Aguilar, female , DOB: [birth year] , age 65 y.o. , MRN: [id number] , ADDRESS: [place]  [id number]   OV 02/12/2017  Chief Complaint  Patient presents with  . Follow-up    Pt states her breathing is doing well. Pt denies Catherine cough, SOB, CP/tightness, and f/c/s.    Follow-up severe mid vasculitis syndrome with life-threatening alveolar hemorrhage and acute renal failure is easter weekend 2018  Currently she is doing well. She has finished 4 doses of Rituxan. Last dose was 01/31/2017. There was significant delay between the first 2 doses which were on schedule in the third and fourth doses delayed. Still on prednisone 60 mg per day. She is accompanied by her daughter [name]. Overall she's doing well. This no hemoptysis anymore. This no shortness of breath. Her G6PD was normal in May 2018. Her Quantiferon Gold was normal. Her most recent chemistries were in May 2018 and a creatinine that improved with 3 mg percent approximately.  OV 05/03/2017  Chief Complaint  Patient presents with  . Follow-up    Pt states her breathing is unchanged since last OV. Pt denies cough, CP/tightness, f/c/s.       65 year old female with systemic vasculitis syndrome. Last dose of Rituxan was in May 2018. She is currently tapered her prednisone down to 10 mg per day. She is on Bactrim prophylaxis. She has visited with nephrology Dr. Lu Duffel in the last 1 month. According to her history blood tests have improved. She does not want to have repeat blood test today. Last blood test was within a month. She is getting iron infusions at short stay. According to the daughter blood pressure was high and one of the infusions was held off. There is no hemoptysis chest pain or hematuria or Catherine clinical evidence of vasculitis. She is due for flu shot.   OV 09/19/2017  Chief Complaint  Patient presents with  . Follow-up    Pt was seen at the hospital  09/02/17 due to breathing problems and after doing breathing treatments, was sent home. Other than that, pt states that she has been doing good.     65 year old female with systemic vasculitis syndrome.  Last dose of Rituxan May 2018.  She had life-threatening hemoptysis and respiratory failure and renal failure back in early 2018.  Last seen in August 2018.  After that she did not follow-up.  She is here with her daughter.  It appears that Christmas Eve 2018 she decided to smoke cigarettes because of the holidays when she got acute respiratory exacerbation with wheezing and went to the emergency department.  I personally reviewed the chart and the chest x-ray that was clear.  She was discharged with a steroid burst.  Now she is back to baseline prednisone 10 mg/day and her baseline Bactrim for her Wegener's vasculitis.  She tells me that she is getting Rituxan shots once a month at the short stay hospital at [facility] but when I reviewed the chart this is actually Aranesp.  She is due for a B-cell flow cytometry test today because it has been 9-10 months.  Overall she is feeling fine except fatigue.  She tells me that she does not have Catherine eye issues or neuritis of mononeuritis multiplex or renal issues although most recent creatinine continues to be on 3 mg percent.  There is no hemoptysis or shortness of breath currently.   OV 01/21/2018  Chief Complaint  Patient presents with  . Follow-up    Pt states she has been doing good since last visit but states she has an uncontrollable cough that she has had since March 2018 that will not go away. Pt is not coughing up Catherine mucus but does have postnasal drainage that is clear.    65 year old female with systemic vasculitis syndrome with life-threatening hemoptysis and respiratory and renal failure in early 2018.Marland Kitchen  Last dose of Rituxan May 2018.  Since then on daily prednisone and Bactrim prophylaxis.  Last B-cell flow cytometry January 2019 showing  suppression of B cells.  She presents with her daughter.  She as usual is a poor historian.  As best as I can gather it appears since her last visit she is having recurrent episodes of bronchitis and has been treated with 2 rounds of antibiotics/2 rounds of prednisone burst including a February 2019 visit to the emergency department where she had a chest x-ray that I personally visualized and it looks clear.  She also feels that she is having a chronic cough that is worse than her baseline.  She tells me now that she has had this cough since March 2018 when she suffered from vasculitis syndrome but the daughter says that it is certainly worse in the last few months since the last visit associated with chest tightness.  There is no hemoptysis or edema.  In the last week or so she feels like she has had an acute sinusitis with yellow sputum and she wants a different antibiotic.  There is no fever or hemoptysis or edema orthopnea proximal nocturnal dyspnea or weight loss. FeNO 8ppb and does not suggest asthma    05/26/2018 -   Chief Complaint  Patient presents with  . Follow-up    Pt states she has been doing well since last visit except she states still has the uncontrollable cough that she is unable to get rid of. States she is coughing up clear to light yellow to green mucus and has some occ SOB when exerting herself.    65 year old female with systemic vasculitis syndrome with life-threatening hemoptysis and respiratory and renal failure in early 2018.Marland Kitchen  Last dose of Rituxan May 2018.  Since then on daily prednisone and Bactrim prophylaxis.  Last B-cell flow cytometry January 2019 showing suppression of B cells but in summer 2019 emergence with 3% B cells associated with rising MPO tites in renal clinic summer 2019. Rx for relapose 1058m IV rituxan - May 08, 2018. [facility]   Catherine Aguilar 65y.o. -presents for follow-up. Last seen in May 2019. At that time B-cell flow  cytometry was repeated and showed a 3% B-cell population which was the beginning of emergence following Rituxan a year earlier. At that point in her she was feeling okay but she was still smoking. She also had chronic cough which was controlled by  trelegy inhaler. She subsequently saw Dr. DLorrene Reidin nephrology and it was noticed that her MPO titer was coming back and clinically Dr. DLorrene Reidfelt that she was relapsing. Therefore we arranged for single dose Rituxan thousand milligrams which was administered end of August 2019 without Catherine problems. At this point in time her main issues that she continues to smoke. She has a chronic cough which she says is severe but happens only every few weeks or every several days. She is not taking her inhaler regularly. Review of radiology showed last CT scan of the chestwas early in 2018  and a chest x-ray in February 2019 that was clear. She continues on Bactrim. I do not see prednisone thought at last viit instructed to take pred $RemoveB'5mg'tKwtNHHP$  per day but she says she "finished it" which was not her instruction    OV 09/23/2018  Subjective:  Patient ID: Catherine Aguilar, female , DOB: December 19, 1955 , age 58 y.o. , MRN: [id number] , ADDRESS: [place] [id number]   65 year old female with systemic vasculitis syndrome with life-threatening hemoptysis and respiratory and renal failure in early 2018.Marland Kitchen  Last dose of Rituxan May 2018.  Since then on daily prednisone and Bactrim prophylaxis.  Last B-cell flow cytometry January 2019 showing suppression of B cells but in summer 2019 emergence with 3% B cells associated with rising MPO tites in renal clinic summer 2019. Rx for relapose (based on b cells and creat rise) $RemoveBef'1000mg'SFXTOmPpvt$  IV rituxan - May 08, 2018. Prednison not in [facility]  Sept 2019, Jan 2020 but she is on bactrim for prophylaxis   09/23/2018 -   Chief Complaint  Patient presents with  . Follow-up    Pt states she has been doing well since last visit and denies Catherine  complaints.     HPI Catherine Aguilar 65 y.o. -presents for follow-up of the above issue.  She presents by herself.  As usual she is a poor historian.  This is evidenced by the fact that she does not know that she suffers from vasculitis.  She has no idea what this time even means.  She has no idea that she received Rituxan although she recollects receiving infusion.  Overall she tells me that she is well except for mild chronic cough.  She continues to smoke.  Her last Rituxan infusion was in August 2019.  Then in November 2019 she had a CT scan of the chest that shows new findings of coronary artery calcification.  In addition she is noted to have emphysema.  No obvious evidence of Catherine Wegener's vasculitis.  She is up-to-date with her respiratory vaccines.  She continues on Bactrim prophylaxis but is not on Catherine inhaler therapy other than pro-air.  She is not sure about quitting smoking.    OV 05/07/2019  Subjective:  Patient ID: Catherine Aguilar, female , DOB: September 27, 1955 , age 24 y.o. , MRN: [id number] , ADDRESS: [place] [id number]   05/07/2019 -   Chief Complaint  Patient presents with  . Systemic Vaculitis Syndrome    No changes in breathing since last visit in January 2020. Medication usage still the same.     65 year old female with systemic vasculitis syndrome with life-threatening hemoptysis and respiratory and renal failure in early 2018.s/p induction Rituxan ending  May 2018.  Tthen on daily prednisone and Bactrim prophylaxis.  Last B-cell flow cytometry January 2019 showing suppression of B cells but in summer 2019 emergence with 3% B cells associated with rising MPO tites in renal clinic summer 2019. Rx for relapose (based on b cells and creat rise) $RemoveBef'1000mg'hVISvOWURb$  IV rituxan - May 08, 2018. Prednison not in [facility]  Sept 2019, Jan 2020 and Aug 2020 but she is on bactrim for prophylaxis  Follow-up vasculitis Follow-up emphysema Follow-up smoking  HPI Catherine Aguilar 65 y.o.  -presents for follow-up.  Last visit was in January 2020 at which time she was in clinical remission from a vasculitis.  I recommended cardiology follow-up for pulmonary artery calcification.  She had a myocardial perfusion test in February 2020 and this was normal.  She  continues to do well.  Her last Rituxan was in August 2019.  She is now worried that her vasculitis is back because she had one episode of epistaxis when she woke up in the middle of the night this was just brief when she blew her nose.  Other than that she is not had Catherine other episode.  She not having Catherine sinus issues.  No respiratory issues.  She says she is renal and her kidney numbers are actually doing better.  She has chronic kidney disease with a creatinine of over 2 in our health system.  She continues with Bactrim but she is not on prednisone.   For emphysema: Last visit I started on Spiriva but I do not see this:  For smoking: She continues to smoke cigarettes.     OV 02/16/2020  Subjective:  Patient ID: Catherine Aguilar, female , DOB: 02/24/1956 , age 57 y.o. , MRN: [id number] , ADDRESS: [place] [id number]   02/16/2020 -   Chief Complaint  Patient presents with  . Follow-up    Pt states she has been doing okay since last visit. States she still becomes SOB and has been coughing a lot. Pt states she will occ cough up beige to light green phlegm.   Follow-up emphysema: On Spiriva Follow-up smoking: Quit summer 2020 Follow-up immunosuppressed status due to vasculitis: On Bactrim prophylaxis Follow-up anemia: On Aranesp monitored by renal. Follow-up vasculitis  - systemic vasculitis syndrome with life-threatening hemoptysis and respiratory and renal failure in early 2018.s/p induction Rituxan ending  May 2018.  Tthen on daily prednisone and Bactrim prophylaxis.  Last B-cell flow cytometry January 2019 showing suppression of B cells but in summer 2019 emergence with 3% B cells associated with rising MPO  tites in renal clinic summer 2019.   -  Rx for relapose (based on b cells and creat rise) $RemoveBef'1000mg'YVAuyhuxcE$  IV rituxan - May 08, 2018.,  80 mg IV x1 October 21, 2019  -Off prednisone because she could not get a refill [Prednison not in [facility]  Sept 2019, Jan 2020 and Aug 2020 and June 2021)  -  On bactrim for prophylaxis  HPI Catherine Aguilar 65 y.o. -returns for follow-up.  Last seen in the summer 2020.  Reviewed by myself.  In the interim she tells me that she has quit smoking.  She says dyspnea stable.  Present on exertion relieved by rest.  No hemoptysis no vasculitis symptoms.  She maintains herself on Spiriva.  Initially she said her last Rituxan was in September 2020.  I question the several times to her and she kept repeating it was September 2020.  Later we reviewed the records from Dr. Madelon Lips her nephrologist and her last Rituxan was in February 2021.  Moving forward I believe Dr. Hollie Salk wants me to be in charge of the Rituxan.  Earlier this year she had sinusitis symptoms with bloody nasal clots and shortness of breath and wheezing.  She was treated with conventional antibiotics and prednisone by our nurse practitioner.  Dr. Hollie Salk did give 1 dose of Rituxan 800 mg on October 21, 2019.  Since this Rituxan dosage her sinus symptoms and her respiratory symptoms have improved.  In April 2020 when she had MPO antibody tested by Dr. Hollie Salk and this was elevated.     OV 06/07/2020   Subjective:  Patient ID: Catherine Aguilar, female , DOB: Jun 28, 1956, age 53 y.o. years. , MRN: [id number],  ADDRESS: [place] [address]  PCP  Daisy Floro, DO Providers : Treatment Team:  Attending Provider: Brand Males, MD   Chief Complaint  Patient presents with  . Follow-up    no complaints     Follow-up emphysema: On Spiriva Follow-up smoking: Quit summer 2020 Followup MJ smoking: admitted this to Sept 2021 (+ve urine march2018) Follow-up immunosuppressed status due to  vasculitis: On Bactrim prophylaxis Follow-up anemia: On Aranesp monitored by renal. Follow-up vasculitis  - systemic vasculitis syndrome with life-threatening hemoptysis and respiratory and renal failure in early 2018.s/p induction Rituxan ending  May 2018.  Tthen on daily prednisone and Bactrim prophylaxis.  Last B-cell flow cytometry January 2019 showing suppression of B cells but in summer 2019 emergence with 3% B cells associated with rising MPO tites in renal clinic summer 2019.   -  Rx for relapose (based on b cells and creat rise) $RemoveBef'1000mg'xxlUbRJOnl$  IV rituxan - May 08, 2018.,  80 mg IV x1 October 21, 2019  -Off prednisone because she could not get a refill [Prednison not in [facility]  Sept 2019, Jan 2020 and Aug 2020 and June 2021 and sept 2021)  -  On bactrim for prophylaxis   HPI Catherine Aguilar 65 y.o. -returns for routine follow-up. In the interim she called in for an exacerbation and we gave her prednisone antibiotic. At this point in time she is not on chronic prednisone. She is on Spiriva. She feels well. No hemoptysis no respiratory complaints. She says she has had a Covid vaccine but is not documented. She says she had [place]. She also states that the season she has had her flu and pneumonia vaccines. She is not had a shingles vaccine. She does not realize that she is immunosuppressed and needs a Covid booster. She think she has to wait 6 months for Covid booster but it explained to her the literature about getting a booster on immunosuppression. Her last Rituxan was in Feb 2021. She is willing to get monitoring test labs. She is immunosuppressed.  CT Chest  IMPRESSION: 1. No compelling findings of interstitial lung disease at this time. 2. Small parenchymal bands scattered in the mid to lower lungs bilaterally, left greater than right, most compatible with mild nonspecific postinfectious/postinflammatory scarring. 3. Mild centrilobular emphysema with mild diffuse bronchial wall thickening,  suggesting COPD. 4. Stable mild cardiomegaly and small pericardial effusion. 5. Three-vessel coronary atherosclerosis.  Aortic Atherosclerosis (ICD10-I70.0) and Emphysema (ICD10-J43.9).   Electronically Signed   By: Ilona Sorrel M.D.   On: 08/01/2018 13:  ROS - per HPI  Results for NELA, BASCOM (MRN [id number]) as of 02/16/2020 09:19  Ref. Range 05/27/2019 08:46  December 10, 2019 done by renal  Myeloperoxidase Abs Latest Units: AI 2.3 (H)  50.5  Serine Protease 3 Latest Units: AI <1.0  less than 3.5  P-ANCA    1: 640  C ANCA    less than 1: 20   ROS - per HPI   Results for ALSHA, MELAND (MRN [id number]) as of 06/07/2020 13:38  Ref. Range 12/05/2017 11:15 01/02/2018 12:17 01/21/2018 10:54 01/30/2018 11:30 03/04/2018 10:31 04/03/2018 10:36 05/08/2018 09:16 05/26/2018 10:50 06/05/2018 10:52 07/03/2018 11:08 07/31/2018 11:30 08/28/2018 11:15 10/16/2018 10:56 11/13/2018 12:00 01/15/2019 10:46 02/12/2019 11:00 04/02/2019 12:07 05/07/2019 10:01 05/27/2019 08:46 05/28/2019 13:09 06/19/2019 12:21 07/31/2019 09:54 08/19/2019 15:17 09/15/2019 10:14 10/13/2019 10:30 11/10/2019 10:22 01/05/2020 10:12 02/02/2020 10:07 02/16/2020 09:19 03/21/2020 09:51 04/18/2020 08:34 05/30/2020 10:45  Creatinine Latest Ref Range: 0.40 - 1.20 mg/dL   2.73 (H)     2.98 (  H)          2.66 (H) 3.29 (H)    2.40 (H)      2.76 (H)       OV 11/11/20 with APP Tammy Parerentt  Per Dr. Chase Caller : Discussed with Dr. Hollie Salk of renal.  She says the patient's kidney function is normal.  Blood labs are also okay.  Patient is feeling okay.  However the ANCA vasculitis antibody MPO was going up significantly.  It has been 1 year since patient's last dose of Rituxan.  She feels that the disease is going to get active.  She is recommending another dose of Rituxan within the next 1 month  Case discussed with Dr. Chase Caller.  Dr. Chase Caller spoke with nephrology.  Recent lab work from renal showed MPO antibody is trending up indicative of active vasculitis.  With  recommendations to set up a Rituxan infusion .  Advised to use Rituxan protocol. Reach out to pharmacy [facility] for protocol questions.   We will set up as previously done last in February 2021. Renal notes from October 24, 2020 reviewed and showed MPO antibody greater than 100 P ANCA 1: 640 ESR 40 serum creatinine 2.67 GFR 21 hemoglobin 11.8  Patient will be set up for Rituxan  infusion per protocol with Rituxan 800 mg (375 mg/M2 ) IV x1. Predose with Tylenol and Benadryl . Continue on Bactrim DS for PJP proph. 3d/wk  Patient is to follow-up in 4 weeks with Dr. Chase Caller in the office for a follow-up visit.    OV 01/30/2021  Subjective:  Patient ID: Catherine Aguilar, female , DOB: 1956-07-12 , age 44 y.o. , MRN: [id number] , ADDRESS: [id number]  [place] [address] PCP Daisy Floro, DO Patient Care Team: Daisy Floro, DO as PCP - General (Family Medicine)  This Provider for this visit: Treatment Team:  Attending Provider: Brand Males, MD    01/30/2021 -   Chief Complaint  Patient presents with  . Follow-up    Pt states she has been doing okay since last visit. States if she gets hot, she will have an uncontrollable cough and will occ cough up phlegm.    Follow-up emphysema: On Spiriva Follow-up smoking: Quit summer 2020 Followup MJ smoking: admitted this to Sept 2021 (+ve urine march2018) Follow-up immunosuppressed status due to vasculitis: On Bactrim prophylaxis Follow-up anemia: On Aranesp monitored by renal. Follow-up vasculitis  - systemic vasculitis syndrome with life-threatening hemoptysis and respiratory and renal failure in early 2018.s/p induction Rituxan ending  May 2018.  Tthen on daily prednisone and Bactrim prophylaxis.  Last B-cell flow cytometry January 2019 showing suppression of B cells but in summer 2019 emergence with 3% B cells associated with rising MPO tites in renal clinic summer 2019.   -  Rx for relapose (based on b cells and creat  rise) $Remo'1000mg'MNLCE$  IV rituxan - May 08, 2018.,  80 mg IV x1 October 21, 2019 -> repeat March 2022  -Off prednisone because she could not get a refill [Prednison not in [facility]  Sept 2019, Jan 2020 and Aug 2020 and June 2021,  sept 2021, may 2022)  -  On bactrim for prophylaxis   Quantiferon gold - negtive Sept 2021  HPI Catherine Aguilar 65 y.o. -returns for follow-up.  Last seen by myself in September 2021.  After that she was seen by nurse practitioner in March 2022.  The spring 2022 visit was following nephrology concerns of the vasculitis was reemerging.  Patient was  able to see nurse practitioner and we set her up for Rituxan infusion.  She says she has gotten the Rituxan infusion mid March 2022.  After that her sinus symptoms have improved a lot.  She says she feels good except when she exerts the summer heat gets hot then she starts coughing.  She continues to smoke marijuana.  She is refusing to quit.  She uses albuterol as needed.  She is not using her Spiriva on a regular basis.  She is unclear why she is not using Spiriva on a regular basis.  She says she has a lot of inhalers at home and she does not know them other than albuterol.  She does not recollect the fact she has been prescribed Spiriva in the past and instructed to take it on a regular basis.  She is willing to give this a try.  There are no other new issues.  Overall health status is stable.  In terms of vaccination: She says she has had of 4 shots of COVID-vaccine.  Unclear if she got monoclonal antibody prophylaxis.  Last time when she was here we checked her COVID IgG and it was negative and I recommended monoclonal antibody prophylaxis but she does not remember this.  She is willing to have a COVID IgG checked.  Also unclear if she has had a shingles vaccine.  Gave instructions about it last time.    CT Chest data  No results found.    PFT  No flowsheet data found.     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), CVA (cerebral vascular accident) ([place]) (07/03/2012), Diffuse pulmonary alveolar hemorrhage, Edema of right lower extremity (03/25/2017), Essential hypertension (12/08/2016), Hemoptysis (12/08/2016), High cholesterol, HLD (hyperlipidemia) (12/08/2016), adenomatous polyp of colon (04/16/2017), Hypertension, Hypokalemia (07/02/2012), Intra-alveolar hemorrhage (12/08/2016), Left facial numbness (07/02/2012), Sepsis ([place]) (12/08/2016), Shortness of breath (12/02/2017), Stroke [facility]), Substance abuse ([place]) (12/08/2016), Systemic vasculitis syndrome ([place]) (12/08/2016), Tobacco abuse (12/08/2016), Type 2 diabetes mellitus without complication, with long-term current use of insulin ([place]) (02/11/2017), and Wegener's granulomatosis with renal involvement ([place]) (12/08/2016).   reports that she quit smoking about 22 months ago. Her smoking use included cigarettes. She started smoking about 52 years ago. She has a 9.80 pack-year smoking history. She has never used smokeless tobacco.  Past Surgical History:  Procedure Laterality Date  . ABDOMINAL HYSTERECTOMY  1985    No Known Allergies  Immunization History  Administered Date(s) Administered  . Influenza Split 07/02/2012  . Influenza,inj,Quad PF,6+ Mos 05/03/2017, 05/13/2018, 06/08/2019  . Pneumococcal Polysaccharide-23 07/02/2012, 11/22/2017, 05/30/2020    Family History  Problem Relation Age of Onset  . Cancer Father   . Alcohol abuse Father   . Diabetes Father   . Hypertension Father   . Heart attack Mother   . Diabetes Mother   . Hypertension Mother   . Stroke Mother   . Breast cancer  Neg Hx   . Colon cancer Neg Hx   . Esophageal cancer Neg Hx   . Rectal cancer Neg Hx   . Stomach cancer Neg Hx   . Pancreatic cancer Neg Hx      Current Outpatient Medications:  .  albuterol (PROAIR HFA) 108 (90 Base) MCG/ACT  inhaler, INHALE 2 PUFFS BY MOUTH EVERY 4-6 HOURS, Disp: 8 g, Rfl: 2 .  albuterol (PROVENTIL) (2.5 MG/3ML) 0.083% nebulizer solution, Take 3 mLs (2.5 mg total) by nebulization every 6 (six) hours as needed for wheezing or shortness of breath., Disp: 150 mL, Rfl: 1 .  aspirin 81 MG EC tablet, TAKE 1 TABLET BY MOUTH EVERY DAY, Disp: 32 tablet, Rfl: 12 .  benzonatate (TESSALON) 200 MG capsule, Take 1 capsule (200 mg total) by mouth 3 (three) times daily as needed for cough., Disp: 30 capsule, Rfl: 1 .  cetirizine (ZYRTEC) 10 MG tablet, TAKE 1 TABLET BY MOUTH EVERY DAY, Disp: 90 tablet, Rfl: 2 .  fluticasone (FLONASE) 50 MCG/ACT nasal spray, Place 2 sprays into both nostrils daily., Disp: 16 g, Rfl: 2 .  glucose blood (ACCU-CHEK GUIDE) test strip, Check blood glucose prior to lantus administration, Disp: 100 each, Rfl: 12 .  hydrALAZINE (APRESOLINE) 100 MG tablet, Take 100 mg by mouth 3 (three) times daily., Disp: , Rfl:  .  Insulin Glargine (BASAGLAR KWIKPEN) 100 UNIT/ML, Inject 5 Units into the skin at bedtime., Disp: 15 mL, Rfl: 1 .  labetalol (NORMODYNE) 300 MG tablet, Take 300 mg by mouth 2 (two) times daily., Disp: , Rfl:  .  Lancets Misc. (ACCU-CHEK SOFTCLIX LANCET DEV) KIT, Check BG prior to administering insulin, Disp: 1 kit, Rfl: 3 .  LINZESS 72 MCG capsule, TAKE 1 CAPSULE (72 MCG TOTAL) BY MOUTH DAILY BEFORE BREAKFAST., Disp: 30 capsule, Rfl: 0 .  pravastatin (PRAVACHOL) 40 MG tablet, TAKE 1 TABLET BY MOUTH EVERY DAY AT BEDTIME FOR CHOLESTEROL, Disp: 90 tablet, Rfl: 3 .  sodium bicarbonate 650 MG tablet, Take 2 tablets (1,300 mg total) by mouth 2 (two) times daily., Disp: 60 tablet, Rfl: 1 .  sulfamethoxazole-trimethoprim (BACTRIM DS) 800-160 MG tablet, Take 1  tablet by mouth 3 (three) times a week., Disp: 12 tablet, Rfl: 5 .  Tiotropium Bromide Monohydrate (SPIRIVA RESPIMAT) 1.25 MCG/ACT AERS, Inhale 2 puffs into the lungs daily., Disp: 4 g, Rfl: 6 .  Tiotropium Bromide Monohydrate (SPIRIVA RESPIMAT) 1.25 MCG/ACT AERS, Inhale 2 puffs into the lungs daily., Disp: 4 g, Rfl: 0      Objective:   Vitals:   01/30/21 1111  BP: 120/78  Pulse: (!) 55  Temp: (!) 97.5 F (36.4 C)  TempSrc: Temporal  SpO2: 100%  Weight: 218 lb 9.6 oz (99.2 kg)  Height: $Remove'5\' 7"'IJVGvTX$  (1.702 m)    Estimated body mass index is 34.24 kg/m as calculated from the following:   Height as of this encounter: $RemoveBeforeD'5\' 7"'gviQwQbFUozaks$  (1.702 m).   Weight as of this encounter: 218 lb 9.6 oz (99.2 kg).  $Rem'@WEIGHTCHANGE'jxLr$ @  [employer]   01/30/21 1111  Weight: 218 lb 9.6 oz (99.2 kg)     Physical Exam General: No distress. Looks well Neuro: Alert and Oriented x 3. GCS 15. Speech normal Psych: Pleasant Resp:  Barrel Chest - no.  Wheeze - no, Crackles - no, No overt respiratory distress CVS: Normal heart sounds. Murmurs - no Ext: Stigmata of Connective Tissue Disease - no HEENT: Normal upper airway. PEERL +. No post nasal drip        Assessment:       ICD-10-CM   1. Pulmonary emphysema, unspecified emphysema type ([place])  I77.8 Basic metabolic panel    Hepatic function panel    CBC with Differential/Platelet    SARS-COV-2 IgG  2. Immunosuppressed status ([place])  E42.3 Basic metabolic panel    Hepatic  function panel    CBC with Differential/Platelet    SARS-COV-2 IgG  3. Systemic vasculitis syndrome (H[facility])  D56.8 Basic metabolic panel    Hepatic function panel    CBC with Differential/Platelet    SARS-COV-2 IgG       Plan:     Patient Instructions     ICD-10-CM   1. Systemic vasculitis syndrome ([place])  M31.8   2. Encounter for therapeutic drug level monitoring  Z51.81   3. High risk medication use  Z79.899   4. Immunosuppressed status ([place])  D84.9   5. Therapeutic drug monitoring   Z51.81   6. Marijuana smoker  F12.90   7. Vaccine counseling  Z71.89   8. Pulmonary emphysema, unspecified emphysema type ([place])  J43.9   .   VAsculitis Immunosuppressed status Sp Rituxan repeat March 2022  Clinically vasculitis in remission Glad smoking in remission Dyspne is mild and stable and likely due to weight, deconditioning and mild associated emphysema  Plan  - check bmet, lft, cbc 01/30/2021  Covid vaccine counseling  - noted 4 doses of covid vaccine with 4th mRNA shot last week  but earlier this year no response to prior mRNA vaccione doses   Plan -Check Covid IgG 01/30/2021 - if still negative might need Evusheld   Emphysema   - noticed you are not on spiriva daily which I think you should be - you are only taking albuterol as needed  plan - restart spiriva respimat daily (take sample) -albuterolas needed   Marijuana use  - this is hurting your lung in long run  plan  - please quit aSAP  VAccine cousneling -CMA to document that you got Pfizer Covid vaccine all 4 shots - Please talk to PCP Daisy Floro, DO -  and ensure you get  shingrix ([place]) inactivated vaccine against shingles   Followup  -6 months or sooner If neede; 15 min slot     SIGNATURE    Dr. Brand Males, M.D., F.C.C.P,  Pulmonary and Critical Care Medicine Staff Physician, [place] Director - Interstitial Lung Disease  Program  [facility] [place], [place], [address]  Pager: [phone], If no answer or between  15:00h - 7:00h: call [phone] Telephone: [phone]  12:07 PM 01/30/2021

## 2021-01-30 NOTE — Patient Instructions (Signed)
ICD-10-CM   1. Systemic vasculitis syndrome ([place])  M31.8   2. Encounter for therapeutic drug level monitoring  Z51.81   3. High risk medication use  Z79.899   4. Immunosuppressed status ([place])  D84.9   5. Therapeutic drug monitoring  Z51.81   6. Marijuana smoker  F12.90   7. Vaccine counseling  Z71.89   8. Pulmonary emphysema, unspecified emphysema type ([place])  J43.9   .   VAsculitis Immunosuppressed status Sp Rituxan repeat March 2022  Clinically vasculitis in remission Glad smoking in remission Dyspne is mild and stable and likely due to weight, deconditioning and mild associated emphysema  Plan  - check bmet, lft, cbc 01/30/2021  Covid vaccine counseling  - noted 4 doses of covid vaccine with 4th mRNA shot last week  but earlier this year no response to prior mRNA vaccione doses   Plan -Check Covid IgG 01/30/2021 - if still negative might need Evusheld   Emphysema   - noticed you are not on spiriva daily which I think you should be - you are only taking albuterol as needed  plan - restart spiriva respimat daily (take sample) -albuterolas needed   Marijuana use  - this is hurting your lung in long run  plan  - please quit aSAP  VAccine cousneling -CMA to document that you got Pfizer Covid vaccine all 4 shots - Please talk to PCP Daisy Floro, DO -  and ensure you get  shingrix ([place]) inactivated vaccine against shingles   Followup  -6 months or sooner If neede; 15 min slot

## 2021-01-31 ENCOUNTER — Ambulatory Visit (HOSPITAL_COMMUNITY)
Admission: RE | Admit: 2021-01-31 | Discharge: 2021-01-31 | Disposition: A | Discharge status: Home / Self Care | Payer: Medicare HMO | Source: Ambulatory Visit | Attending: Nephrology | Admitting: Nephrology

## 2021-01-31 VITALS — BP 163/60 | HR 59 | Temp 97.5°F | Resp 18

## 2021-01-31 DIAGNOSIS — N184 Chronic kidney disease, stage 4 (severe): Secondary | ICD-10-CM | POA: Insufficient documentation

## 2021-01-31 DIAGNOSIS — D631 Anemia in chronic kidney disease: Secondary | ICD-10-CM | POA: Insufficient documentation

## 2021-01-31 DIAGNOSIS — N170 Acute kidney failure with tubular necrosis: Secondary | ICD-10-CM | POA: Insufficient documentation

## 2021-01-31 DIAGNOSIS — R6889 Other general symptoms and signs: Secondary | ICD-10-CM | POA: Diagnosis not present

## 2021-01-31 LAB — IRON AND TIBC
Iron: 71 ug/dL (ref 28–170)
Saturation Ratios: 21 % (ref 10.4–31.8)
TIBC: 333 ug/dL (ref 250–450)
UIBC: 262 ug/dL

## 2021-01-31 LAB — HEPATIC FUNCTION PANEL
ALT: 16 U/L (ref 0–35)
AST: 18 U/L (ref 0–37)
Albumin: 4.1 g/dL (ref 3.5–5.2)
Alkaline Phosphatase: 58 U/L (ref 39–117)
Bilirubin, Direct: 0 mg/dL (ref 0.0–0.3)
Total Bilirubin: 0.3 mg/dL (ref 0.2–1.2)
Total Protein: 6.9 g/dL (ref 6.0–8.3)

## 2021-01-31 LAB — BASIC METABOLIC PANEL
BUN: 53 mg/dL — ABNORMAL HIGH (ref 6–23)
CO2: 20 mEq/L (ref 19–32)
Calcium: 9.9 mg/dL (ref 8.4–10.5)
Chloride: 108 mEq/L (ref 96–112)
Creatinine, Ser: 3.46 mg/dL — ABNORMAL HIGH (ref 0.40–1.20)
GFR: 13.3 mL/min — CL (ref >60.00)
Glucose, Bld: 93 mg/dL (ref 70–99)
Potassium: 4.6 mEq/L (ref 3.5–5.1)
Sodium: 140 mEq/L (ref 135–145)

## 2021-01-31 LAB — FERRITIN: Ferritin: 64 ng/mL (ref 11–307)

## 2021-01-31 MED ORDER — DARBEPOETIN ALFA 150 MCG/0.3ML IJ SOSY
PREFILLED_SYRINGE | INTRAMUSCULAR | Status: AC
  Filled 2021-01-31: qty 0.3

## 2021-01-31 MED ORDER — DARBEPOETIN ALFA 150 MCG/0.3ML IJ SOSY
150.0000 ug | PREFILLED_SYRINGE | INTRAMUSCULAR | Status: DC
Start: 2021-01-31 — End: 2021-02-01
  Administered 2021-01-31: 150 ug via SUBCUTANEOUS

## 2021-02-01 LAB — POCT HEMOGLOBIN-HEMACUE: Hemoglobin: 11.9 g/dL — ABNORMAL LOW (ref 12.0–15.0)

## 2021-02-03 IMAGING — DX CHEST - 2 VIEW
2 series · 2 of 2 positions shown · non-contrast
Comparison: 10/16/2017

CLINICAL DATA: Vasculitis

EXAM:
CHEST - 2 VIEW

[chest pa]
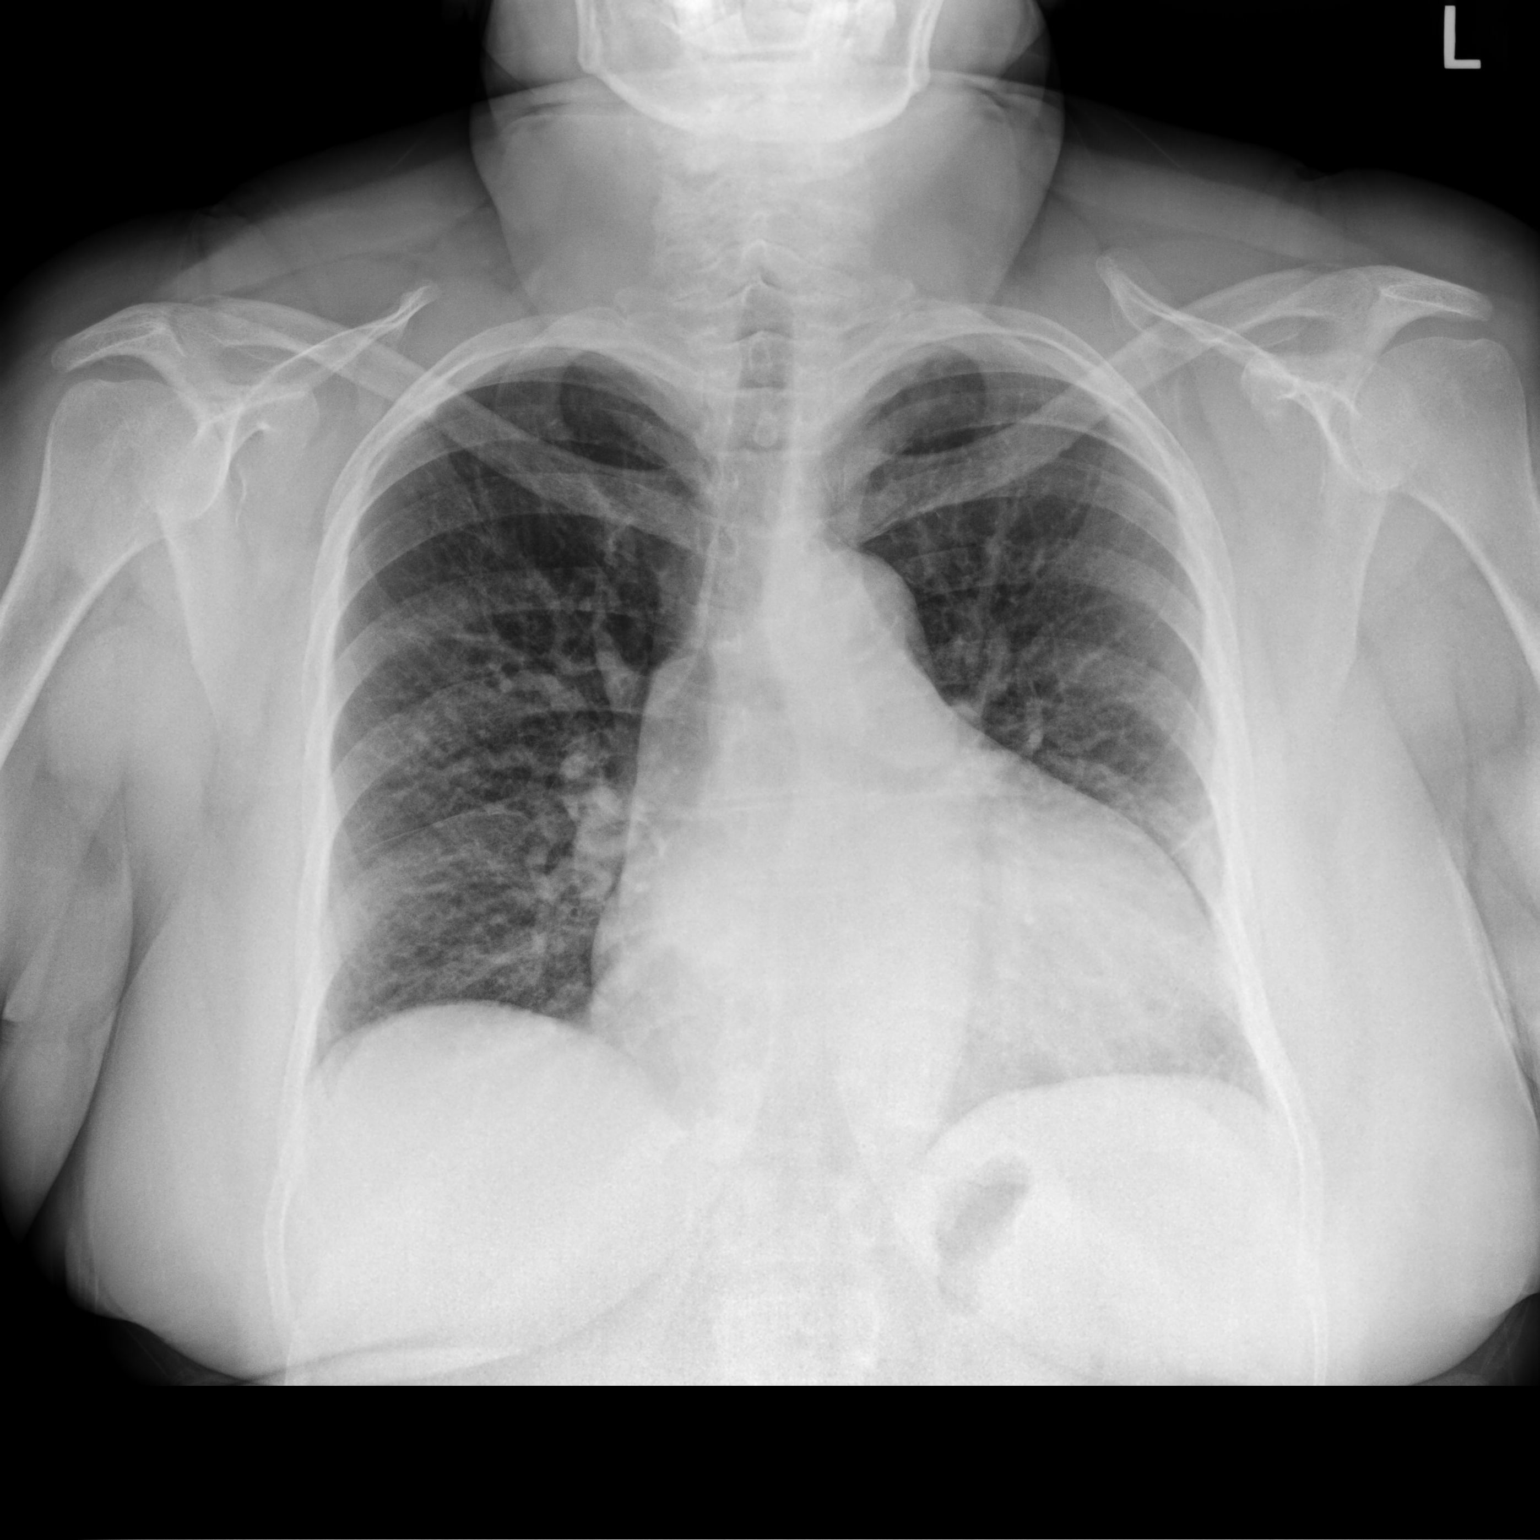

[chest lat]
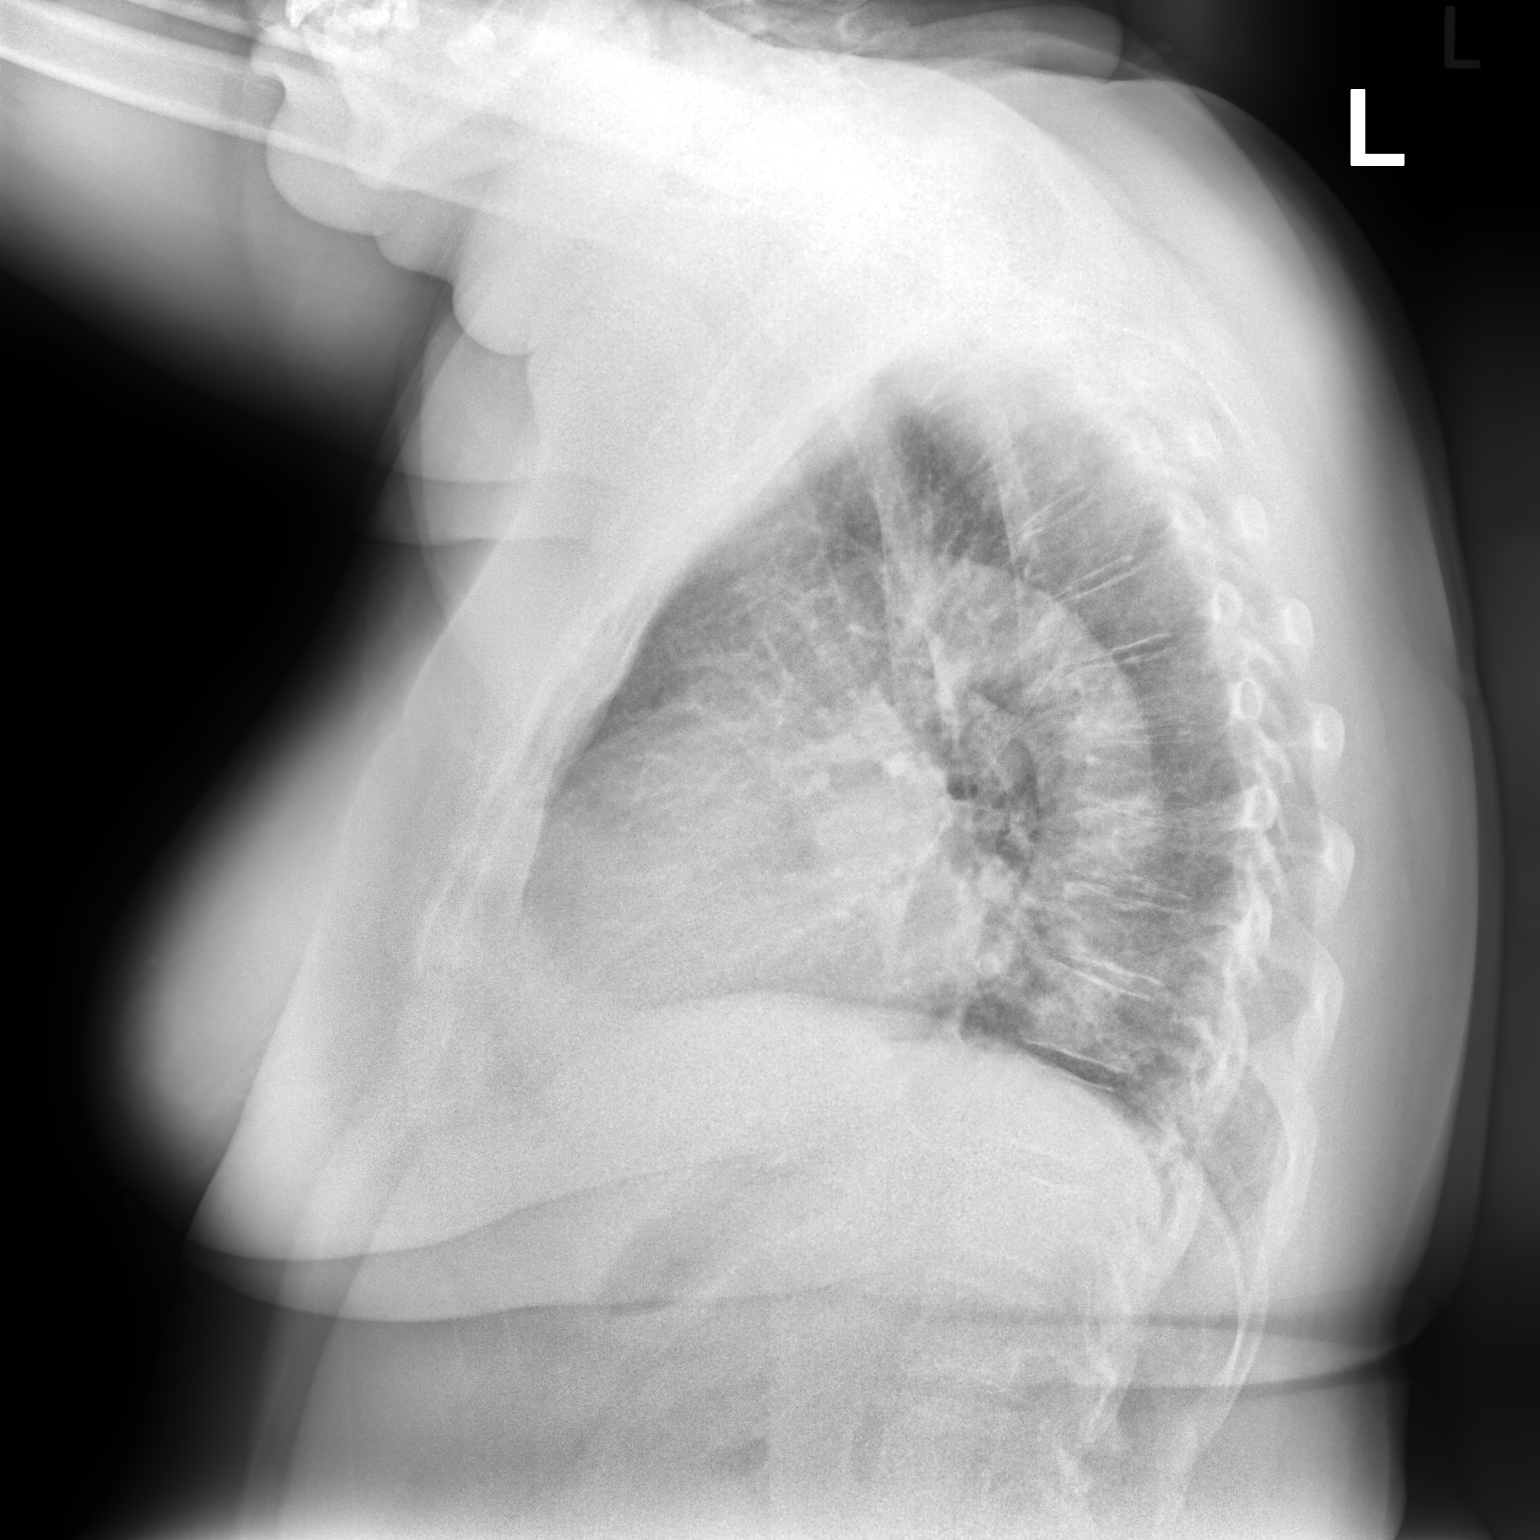

[2 of 2 positions shown; findings below may reference images not displayed]

FINDINGS: Cardiomegaly. Mild, diffuse vascular prominence without overt edema.
The visualized skeletal structures are unremarkable.
IMPRESSION: Cardiomegaly. Mild, diffuse vascular prominence without overt
pulmonary edema. No focal airspace opacity.

## 2021-02-10 ENCOUNTER — Encounter (HOSPITAL_COMMUNITY): Payer: Self-pay | Admitting: *Deleted

## 2021-02-14 ENCOUNTER — Ambulatory Visit: Payer: Medicare Other

## 2021-02-17 ENCOUNTER — Emergency Department (HOSPITAL_COMMUNITY)
Admission: EM | Admit: 2021-02-17 | Discharge: 2021-02-18 | Disposition: A | Discharge status: Home / Self Care | Payer: Medicare Other | Attending: Emergency Medicine | Admitting: Emergency Medicine

## 2021-02-17 ENCOUNTER — Other Ambulatory Visit: Payer: Self-pay

## 2021-02-17 ENCOUNTER — Emergency Department (HOSPITAL_COMMUNITY): Payer: Medicare Other

## 2021-02-17 ENCOUNTER — Encounter (HOSPITAL_COMMUNITY): Payer: Self-pay | Admitting: *Deleted

## 2021-02-17 DIAGNOSIS — I129 Hypertensive chronic kidney disease with stage 1 through stage 4 chronic kidney disease, or unspecified chronic kidney disease: Secondary | ICD-10-CM | POA: Insufficient documentation

## 2021-02-17 DIAGNOSIS — M25561 Pain in right knee: Secondary | ICD-10-CM | POA: Diagnosis not present

## 2021-02-17 DIAGNOSIS — E1122 Type 2 diabetes mellitus with diabetic chronic kidney disease: Secondary | ICD-10-CM | POA: Diagnosis not present

## 2021-02-17 DIAGNOSIS — Z794 Long term (current) use of insulin: Secondary | ICD-10-CM | POA: Insufficient documentation

## 2021-02-17 DIAGNOSIS — Z87891 Personal history of nicotine dependence: Secondary | ICD-10-CM | POA: Insufficient documentation

## 2021-02-17 DIAGNOSIS — M7989 Other specified soft tissue disorders: Secondary | ICD-10-CM | POA: Diagnosis not present

## 2021-02-17 DIAGNOSIS — D631 Anemia in chronic kidney disease: Secondary | ICD-10-CM | POA: Diagnosis not present

## 2021-02-17 DIAGNOSIS — J45909 Unspecified asthma, uncomplicated: Secondary | ICD-10-CM | POA: Diagnosis not present

## 2021-02-17 DIAGNOSIS — I10 Essential (primary) hypertension: Secondary | ICD-10-CM | POA: Diagnosis not present

## 2021-02-17 DIAGNOSIS — N184 Chronic kidney disease, stage 4 (severe): Secondary | ICD-10-CM | POA: Insufficient documentation

## 2021-02-17 DIAGNOSIS — Z7982 Long term (current) use of aspirin: Secondary | ICD-10-CM | POA: Insufficient documentation

## 2021-02-17 DIAGNOSIS — Z79899 Other long term (current) drug therapy: Secondary | ICD-10-CM | POA: Diagnosis not present

## 2021-02-17 DIAGNOSIS — Z7951 Long term (current) use of inhaled steroids: Secondary | ICD-10-CM | POA: Diagnosis not present

## 2021-02-17 DIAGNOSIS — R2243 Localized swelling, mass and lump, lower limb, bilateral: Secondary | ICD-10-CM | POA: Diagnosis not present

## 2021-02-17 DIAGNOSIS — J449 Chronic obstructive pulmonary disease, unspecified: Secondary | ICD-10-CM | POA: Diagnosis not present

## 2021-02-17 DIAGNOSIS — M25473 Effusion, unspecified ankle: Secondary | ICD-10-CM

## 2021-02-17 NOTE — ED Triage Notes (Signed)
The pt is c/o rt knee pain for 3 days no known injury  no previous history

## 2021-02-17 NOTE — ED Provider Notes (Signed)
Emergency Medicine Provider Triage Evaluation Note  Catherine Aguilar , a 65 y.o. female  was evaluated in triage.  Pt complains of right knee pain that started 3 days ago. Denies injury.  Review of Systems  Positive: Right knee pain Negative: numbness  Physical Exam  There were no vitals taken for this visit. Gen:   Awake, no distress   Resp:  Normal effort  MSK:   Moves extremities without difficulty  Other:  Right knee TTP  Medical Decision Making  Medically screening exam initiated at 4:29 PM.  Appropriate orders placed.  Jarrett Moncrief was informed that the remainder of the evaluation will be completed by another provider, this initial triage assessment does not replace that evaluation, and the importance of remaining in the ED until their evaluation is complete.    Bishop Dublin 02/17/21 1632    Noemi Chapel, MD 02/18/21 [phone]

## 2021-02-18 DIAGNOSIS — M25561 Pain in right knee: Secondary | ICD-10-CM | POA: Diagnosis not present

## 2021-02-18 LAB — CBC WITH DIFFERENTIAL/PLATELET
Abs Immature Granulocytes: 0.03 10*3/uL (ref 0.00–0.07)
Basophils Absolute: 0 10*3/uL (ref 0.0–0.1)
Basophils Relative: 1 %
Eosinophils Absolute: 0.1 10*3/uL (ref 0.0–0.5)
Eosinophils Relative: 2 %
HCT: 40.6 % (ref 36.0–46.0)
Hemoglobin: 12.2 g/dL (ref 12.0–15.0)
Immature Granulocytes: 0 %
Lymphocytes Relative: 21 %
Lymphs Abs: 1.4 10*3/uL (ref 0.7–4.0)
MCH: 25.6 pg — ABNORMAL LOW (ref 26.0–34.0)
MCHC: 30 g/dL (ref 30.0–36.0)
MCV: 85.1 fL (ref 80.0–100.0)
Monocytes Absolute: 0.5 10*3/uL (ref 0.1–1.0)
Monocytes Relative: 8 %
Neutro Abs: 4.8 10*3/uL (ref 1.7–7.7)
Neutrophils Relative %: 68 %
Platelets: 297 10*3/uL (ref 150–400)
RBC: 4.77 MIL/uL (ref 3.87–5.11)
RDW: 16.1 % — ABNORMAL HIGH (ref 11.5–15.5)
WBC: 6.9 10*3/uL (ref 4.0–10.5)
nRBC: 0 % (ref 0.0–0.2)

## 2021-02-18 LAB — BASIC METABOLIC PANEL
Anion gap: 12 (ref 5–15)
BUN: 48 mg/dL — ABNORMAL HIGH (ref 8–23)
CO2: 21 mmol/L — ABNORMAL LOW (ref 22–32)
Calcium: 9.6 mg/dL (ref 8.9–10.3)
Chloride: 106 mmol/L (ref 98–111)
Creatinine, Ser: 3.05 mg/dL — ABNORMAL HIGH (ref 0.44–1.00)
GFR, Estimated: 16 mL/min — ABNORMAL LOW (ref >60)
Glucose, Bld: 131 mg/dL — ABNORMAL HIGH (ref 70–99)
Potassium: 4.5 mmol/L (ref 3.5–5.1)
Sodium: 139 mmol/L (ref 135–145)

## 2021-02-18 LAB — CBG MONITORING, ED: Glucose-Capillary: 126 mg/dL — ABNORMAL HIGH (ref 70–99)

## 2021-02-18 LAB — TROPONIN I (HIGH SENSITIVITY): Troponin I (High Sensitivity): 18 ng/L — ABNORMAL HIGH (ref <18)

## 2021-02-18 MED ORDER — HYDROCODONE-ACETAMINOPHEN 5-325 MG PO TABS: 2.0000 | ORAL_TABLET | ORAL | 0 refills | Status: DC | PRN

## 2021-02-18 MED ORDER — PREDNISONE 10 MG PO TABS: 20.0000 mg | ORAL_TABLET | Freq: Two times a day (BID) | ORAL | 0 refills | Status: DC

## 2021-02-18 NOTE — ED Notes (Signed)
Pt provided PO fluids per request as allowed by MD

## 2021-02-18 NOTE — Discharge Instructions (Addendum)
Begin taking prednisone as prescribed.  Begin taking hydrocodone as prescribed as needed for pain.  Follow-up with your primary doctor if symptoms are not improving in the next 4 to 5 days, and return to the ER if symptoms significantly worsen or change.

## 2021-02-18 NOTE — ED Provider Notes (Signed)
[facility] EMERGENCY DEPARTMENT Provider Note   CSN: [id number] Arrival date & time: 02/17/21  1623     History Chief Complaint  Patient presents with   Knee Pain    Catherine Aguilar is a 65 y.o. female.  Patient is a 65 year old female with past medical history of diabetes, COPD, acute renal failure, hypertension, hyperlipidemia.  Patient presenting today for evaluation of pain in her right knee and swelling in her hands and ankles.  This has worsened over the past several days.  She denies any specific injury or trauma.  She denies any fevers or chills.  She denies any chest pain or difficulty breathing.  She has pain with ambulation with no alleviating factors.  The history is provided by the patient.  Knee Pain Location:  Knee Time since incident:  3 days Injury: no   Knee location:  R knee Pain details:    Quality:  Aching   Radiates to:  Does not radiate   Severity:  Moderate   Onset quality:  Gradual   Timing:  Constant   Progression:  Worsening Relieved by:  Nothing Worsened by:  Bearing weight     Past Medical History:  Diagnosis Date   Acute renal failure (ARF) ([place]) 12/08/2016   Acute respiratory failure with hypoxia ([place]) 12/08/2016   AKI (acute kidney injury) ([place]) 12/08/2016   Allergy    Anemia    Asthma with COPD (chronic obstructive pulmonary disease) ([place]) 11/25/2017   CAP (community acquired pneumonia) 12/08/2016   Chronic kidney disease    CKD (chronic kidney disease), stage IV ([place]) 02/11/2017   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   CVA (cerebral vascular accident) ([place]) 07/03/2012   Diffuse pulmonary alveolar hemorrhage    Edema of right lower extremity 03/25/2017   Essential hypertension 12/08/2016   Hemoptysis 12/08/2016   High cholesterol    HLD (hyperlipidemia) 12/08/2016   Hx of adenomatous polyp of colon 04/16/2017   Hypertension    stopped taking medications 10 years ago   Hypokalemia 07/02/2012   Intra-alveolar hemorrhage  12/08/2016   Left facial numbness 07/02/2012   Sepsis ([place]) 12/08/2016   Shortness of breath 12/02/2017   Stroke ([place])    Substance abuse ([place]) 12/08/2016   Systemic vasculitis syndrome ([place]) 12/08/2016   Tobacco abuse 12/08/2016   Type 2 diabetes mellitus without complication, with long-term current use of insulin ([place]) 02/11/2017   Wegener's granulomatosis with renal involvement ([place]) 12/08/2016    Patient Active Problem List   Diagnosis Date Noted   Emphysema lung ([place]) 11/11/2020   Chronic rhinitis 11/11/2020   Need for pneumococcal vaccination 05/30/2020   Need for hepatitis C screening test 05/30/2020   Stuffy nose 05/30/2020   Acute maxillary sinusitis 09/14/2019   Shortness of breath 12/02/2017   Asthma with COPD (chronic obstructive pulmonary disease) ([place]) 11/25/2017   Hx of adenomatous polyp of colon 04/16/2017   CKD (chronic kidney disease), stage IV ([place]) 02/11/2017   Type 2 diabetes mellitus without complication, with long-term current use of insulin ([place]) 02/11/2017   Essential hypertension 12/08/2016   HLD (hyperlipidemia) 12/08/2016   Symptomatic anemia 12/08/2016   Wegener's granulomatosis with renal involvement ([place]) 12/08/2016   Systemic vasculitis syndrome ([place]) 12/08/2016   Acute renal failure (ARF) ([place]) 12/08/2016   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     Family History  Problem Relation Age of Onset   Cancer Father    Alcohol abuse Father    Diabetes Father    Hypertension Father    Heart attack Mother    Diabetes Mother    Hypertension Mother    Stroke Mother    Breast cancer Neg Hx    Colon cancer Neg Hx    Esophageal cancer Neg Hx    Rectal cancer Neg Hx    Stomach cancer Neg Hx     Pancreatic cancer Neg Hx     Social History   Tobacco Use   Smoking status: Former    Packs/day: 0.20    Years: 49.00    Pack years: 9.80    Types: Cigarettes    Start date: 09/10/1968    Quit date: 03/25/2019    Years since quitting: 1.9   Smokeless tobacco: Never  Substance Use Topics   Alcohol use: Yes    Comment: on the weekends - moderately per pt   Drug use: Yes    Types: Marijuana    Comment: Friday, Saturday, Sunday    Home Medications Prior to Admission medications   Medication Sig Start Date End Date Taking? Authorizing Provider  albuterol [facility] HFA) 108 (90 Base) MCG/ACT inhaler INHALE 2 PUFFS BY MOUTH EVERY 4-6 HOURS 04/06/20   Milus Banister C, DO  albuterol (PROVENTIL) (2.5 MG/3ML) 0.083% nebulizer solution Take 3 mLs (2.5 mg total) by nebulization every 6 (six) hours as needed for wheezing or shortness of breath. 11/06/20   Milus Banister C, DO  aspirin 81 MG EC tablet TAKE 1 TABLET BY MOUTH EVERY DAY 08/09/20   Milus Banister C, DO  benzonatate (TESSALON) 200 MG capsule Take 1 capsule (200 mg total) by mouth 3 (three) times daily as needed for cough. 11/11/20 11/11/21  Parrett, Fonnie Mu, NP  cetirizine (ZYRTEC) 10 MG tablet TAKE 1 TABLET BY MOUTH EVERY DAY 08/30/20   Milus Banister C, DO  fluticasone [facility]) 50 MCG/ACT nasal spray Place 2 sprays into both nostrils daily. 09/14/19   Martyn Ehrich, NP  glucose blood (ACCU-CHEK GUIDE) test strip Check blood glucose prior to lantus administration 11/06/20   Milus Banister C, DO  hydrALAZINE (APRESOLINE) 100 MG tablet Take 100 mg by mouth 3 (three) times daily.    [provider]  Insulin Glargine (BASAGLAR KWIKPEN) 100 UNIT/ML Inject 5 Units into the skin at bedtime. 08/22/20   Daisy Floro, DO  labetalol (NORMODYNE) 300 MG tablet Take 300 mg by mouth 2 (two) times daily. 01/29/19   [provider]  Lancets Misc. (ACCU-CHEK SOFTCLIX LANCET DEV) KIT Check BG prior to administering insulin  11/06/20   Milus Banister C, DO  LINZESS 72 MCG capsule TAKE 1 CAPSULE (72 MCG TOTAL) BY MOUTH DAILY BEFORE BREAKFAST. 09/07/19   Milus Banister C, DO  pravastatin (PRAVACHOL) 40 MG tablet TAKE 1 TABLET BY MOUTH EVERY DAY AT BEDTIME FOR CHOLESTEROL 11/06/20   Milus Banister C, DO  sodium bicarbonate 650 MG tablet Take 2 tablets (1,300 mg total) by mouth 2 (two) times daily. 11/06/20   Daisy Floro, DO  sulfamethoxazole-trimethoprim (BACTRIM DS) 800-160 MG tablet Take 1 tablet by mouth 3 (three) times a week. 11/16/20   Parrett, Fonnie Mu, NP  Tiotropium Bromide Monohydrate (SPIRIVA RESPIMAT) 1.25 MCG/ACT AERS Inhale 2 puffs into the lungs daily. 03/31/20   Brand Males,  MD  Tiotropium Bromide Monohydrate (SPIRIVA RESPIMAT) 1.25 MCG/ACT AERS Inhale 2 puffs into the lungs daily. 01/30/21   Brand Males, MD    Allergies    Patient has no known allergies.  Review of Systems   Review of Systems  All other systems reviewed and are negative.  Physical Exam Updated Vital Signs BP (!) 250/92   Pulse (!) 51   Temp 98.4 F (36.9 C) (Oral)   Resp 17   Ht '5\' 7"'  (1.702 m)   Wt 99.2 kg   SpO2 99%   BMI 34.25 kg/m   Physical Exam Vitals and nursing note reviewed.  Constitutional:      General: She is not in acute distress.    Appearance: She is well-developed. She is not diaphoretic.  HENT:     Head: Normocephalic and atraumatic.  Cardiovascular:     Rate and Rhythm: Normal rate and regular rhythm.     Heart sounds: No murmur heard.   No friction rub. No gallop.  Pulmonary:     Effort: Pulmonary effort is normal. No respiratory distress.     Breath sounds: Normal breath sounds. No wheezing.  Abdominal:     General: Bowel sounds are normal. There is no distension.     Palpations: Abdomen is soft.     Tenderness: There is no abdominal tenderness.  Musculoskeletal:        General: Normal range of motion.     Cervical back: Normal range of motion and neck supple.      Comments: The right knee appears grossly normal.  There is no warmth or erythema.  There is perhaps a small effusion present.  She has good range of motion with no crepitus.  Patient with mild swelling to both ankles that is nonpitting  Skin:    General: Skin is warm and dry.  Neurological:     General: No focal deficit present.     Mental Status: She is alert and oriented to person, place, and time.    ED Results / Procedures / Treatments   Labs (all labs ordered are listed, but only abnormal results are displayed) Labs Reviewed  BASIC METABOLIC PANEL  CBC WITH DIFFERENTIAL/PLATELET    EKG None  Radiology DG Knee Complete 4 Views Right  Result Date: 02/17/2021 CLINICAL DATA:  Right knee pain for the past 3 days.  No injury. EXAM: RIGHT KNEE - COMPLETE 4+ VIEW COMPARISON:  None. FINDINGS: No acute fracture or dislocation. Small joint effusion. Mild medial compartment joint space narrowing with small marginal osteophytes. Soft tissues are unremarkable. IMPRESSION: 1. Mild medial compartment osteoarthritis. Electronically Signed   By: Titus Dubin M.D.   On: 02/17/2021 18:31    Procedures Procedures   Medications Ordered in ED Medications - No data to display  ED Course  I have reviewed the triage vital signs and the nursing notes.  Pertinent labs & imaging results that were available during my care of the patient were reviewed by me and considered in my medical decision making (see chart for details).    MDM Rules/Calculators/A&P  Patient presenting here with complaints of right knee pain and bilateral ankle swelling.  Her knee pain appears to be related to osteoarthritis.  This is seen on her x-ray and consistent with the clinical picture.  I am uncertain as to the swelling of her ankles, but her renal function appears at baseline and I doubt CHF.  Patient to be discharged with prednisone and pain medicine.  She is  to follow-up with primary doctor if symptoms are not  improving.  Final Clinical Impression(s) / ED Diagnoses Final diagnoses:  None    Rx / DC Orders ED Discharge Orders     None        Veryl Speak, MD 02/18/21 [phone]

## 2021-02-20 ENCOUNTER — Telehealth: Payer: Self-pay

## 2021-02-20 NOTE — Telephone Encounter (Signed)
Transition Care Management Unsuccessful Follow-up Telephone Call  Date of discharge and from where:  02/18/2021 from [facility]  Attempts:  1st Attempt  Reason for unsuccessful TCM follow-up call:  Unable to leave message

## 2021-02-21 NOTE — Telephone Encounter (Signed)
Transition Care Management Follow-up Telephone Call Date of discharge and from where: 02/18/2021 from [facility] How have you been since you were released from the hospital? Pt stated that she was fine Any questions or concerns? No  Items Reviewed: Did the pt receive and understand the discharge instructions provided? Yes  Medications obtained and verified? Yes  Other? No  Any new allergies since your discharge? No  Dietary orders reviewed? No Do you have support at home? Yes   Functional Questionnaire: (I = Independent and D = Dependent) ADLs: I   Follow up appointments reviewed:  PCP Hospital f/u appt confirmed? No   Specialist Hospital f/u appt confirmed? No   Are transportation arrangements needed? No  If their condition worsens, is the pt aware to call PCP or go to the Emergency Dept.? Yes Was the patient provided with contact information for the PCP's office or ED? Yes Was to pt encouraged to call back with questions or concerns? Yes

## 2021-02-28 ENCOUNTER — Ambulatory Visit (HOSPITAL_COMMUNITY)
Admission: RE | Admit: 2021-02-28 | Discharge: 2021-02-28 | Disposition: A | Discharge status: Home / Self Care | Payer: Medicare Other | Source: Ambulatory Visit | Attending: Nephrology | Admitting: Nephrology

## 2021-02-28 ENCOUNTER — Other Ambulatory Visit: Payer: Self-pay

## 2021-02-28 VITALS — BP 159/74 | HR 63

## 2021-02-28 DIAGNOSIS — N184 Chronic kidney disease, stage 4 (severe): Secondary | ICD-10-CM | POA: Diagnosis not present

## 2021-02-28 DIAGNOSIS — D631 Anemia in chronic kidney disease: Secondary | ICD-10-CM | POA: Diagnosis not present

## 2021-02-28 DIAGNOSIS — N170 Acute kidney failure with tubular necrosis: Secondary | ICD-10-CM

## 2021-02-28 LAB — IRON AND TIBC
Iron: 62 ug/dL (ref 28–170)
Saturation Ratios: 19 % (ref 10.4–31.8)
TIBC: 333 ug/dL (ref 250–450)
UIBC: 271 ug/dL

## 2021-02-28 LAB — POCT HEMOGLOBIN-HEMACUE: Hemoglobin: 12.1 g/dL (ref 12.0–15.0)

## 2021-02-28 LAB — FERRITIN: Ferritin: 47 ng/mL (ref 11–307)

## 2021-02-28 MED ORDER — DARBEPOETIN ALFA 150 MCG/0.3ML IJ SOSY: 150.0000 ug | PREFILLED_SYRINGE | INTRAMUSCULAR | Status: DC

## 2021-03-05 NOTE — Progress Notes (Signed)
Subseuqnt creat better. She does followup with renal

## 2021-03-17 DIAGNOSIS — D631 Anemia in chronic kidney disease: Secondary | ICD-10-CM | POA: Diagnosis not present

## 2021-03-17 DIAGNOSIS — I129 Hypertensive chronic kidney disease with stage 1 through stage 4 chronic kidney disease, or unspecified chronic kidney disease: Secondary | ICD-10-CM | POA: Diagnosis not present

## 2021-03-17 DIAGNOSIS — M3131 Wegener's granulomatosis with renal involvement: Secondary | ICD-10-CM | POA: Diagnosis not present

## 2021-03-17 DIAGNOSIS — N184 Chronic kidney disease, stage 4 (severe): Secondary | ICD-10-CM | POA: Diagnosis not present

## 2021-03-17 DIAGNOSIS — I776 Arteritis, unspecified: Secondary | ICD-10-CM | POA: Diagnosis not present

## 2021-03-28 ENCOUNTER — Inpatient Hospital Stay (HOSPITAL_COMMUNITY): Admission: RE | Admit: 2021-03-28 | Payer: Medicare Other | Source: Ambulatory Visit

## 2021-04-04 ENCOUNTER — Encounter (HOSPITAL_COMMUNITY): Payer: Medicare Other

## 2021-04-05 ENCOUNTER — Other Ambulatory Visit: Payer: Self-pay

## 2021-04-05 ENCOUNTER — Ambulatory Visit (HOSPITAL_COMMUNITY)
Admission: RE | Admit: 2021-04-05 | Discharge: 2021-04-05 | Disposition: A | Discharge status: Home / Self Care | Payer: Medicare Other | Source: Ambulatory Visit | Attending: Nephrology | Admitting: Nephrology

## 2021-04-05 VITALS — BP 153/73 | HR 65 | Temp 98.5°F | Resp 18

## 2021-04-05 DIAGNOSIS — D631 Anemia in chronic kidney disease: Secondary | ICD-10-CM | POA: Diagnosis not present

## 2021-04-05 DIAGNOSIS — N184 Chronic kidney disease, stage 4 (severe): Secondary | ICD-10-CM | POA: Insufficient documentation

## 2021-04-05 DIAGNOSIS — N170 Acute kidney failure with tubular necrosis: Secondary | ICD-10-CM

## 2021-04-05 LAB — POCT HEMOGLOBIN-HEMACUE: Hemoglobin: 12.4 g/dL (ref 12.0–15.0)

## 2021-04-05 LAB — FERRITIN: Ferritin: 79 ng/mL (ref 11–307)

## 2021-04-05 LAB — IRON AND TIBC
Iron: 64 ug/dL (ref 28–170)
Saturation Ratios: 19 % (ref 10.4–31.8)
TIBC: 336 ug/dL (ref 250–450)
UIBC: 272 ug/dL

## 2021-04-05 MED ORDER — DARBEPOETIN ALFA 150 MCG/0.3ML IJ SOSY: 150.0000 ug | PREFILLED_SYRINGE | INTRAMUSCULAR | Status: DC

## 2021-04-25 ENCOUNTER — Encounter (HOSPITAL_COMMUNITY): Payer: Medicare Other

## 2021-05-03 ENCOUNTER — Other Ambulatory Visit: Payer: Self-pay

## 2021-05-03 ENCOUNTER — Ambulatory Visit (HOSPITAL_COMMUNITY)
Admission: RE | Admit: 2021-05-03 | Discharge: 2021-05-03 | Disposition: A | Discharge status: Home / Self Care | Payer: Medicare Other | Source: Ambulatory Visit | Attending: Nephrology | Admitting: Nephrology

## 2021-05-03 VITALS — BP 181/99 | HR 69 | Resp 19

## 2021-05-03 DIAGNOSIS — N184 Chronic kidney disease, stage 4 (severe): Secondary | ICD-10-CM | POA: Insufficient documentation

## 2021-05-03 DIAGNOSIS — D631 Anemia in chronic kidney disease: Secondary | ICD-10-CM | POA: Diagnosis not present

## 2021-05-03 DIAGNOSIS — N170 Acute kidney failure with tubular necrosis: Secondary | ICD-10-CM

## 2021-05-03 LAB — IRON AND TIBC
Iron: 62 ug/dL (ref 28–170)
Saturation Ratios: 18 % (ref 10.4–31.8)
TIBC: 344 ug/dL (ref 250–450)
UIBC: 282 ug/dL

## 2021-05-03 LAB — FERRITIN: Ferritin: 71 ng/mL (ref 11–307)

## 2021-05-03 LAB — POCT HEMOGLOBIN-HEMACUE: Hemoglobin: 12 g/dL (ref 12.0–15.0)

## 2021-05-03 MED ORDER — DARBEPOETIN ALFA 150 MCG/0.3ML IJ SOSY: 150.0000 ug | PREFILLED_SYRINGE | INTRAMUSCULAR | Status: DC

## 2021-05-07 ENCOUNTER — Other Ambulatory Visit: Payer: Self-pay | Admitting: Family Medicine

## 2021-05-08 ENCOUNTER — Other Ambulatory Visit: Payer: Self-pay

## 2021-05-09 ENCOUNTER — Other Ambulatory Visit: Payer: Self-pay | Admitting: Family Medicine

## 2021-05-17 ENCOUNTER — Other Ambulatory Visit: Payer: Self-pay | Admitting: Family Medicine

## 2021-05-18 NOTE — Telephone Encounter (Signed)
Patient called to check on status of medication refill.  Will forward to MD.  Catherine Aguilar

## 2021-05-31 ENCOUNTER — Ambulatory Visit (HOSPITAL_COMMUNITY)
Admission: RE | Admit: 2021-05-31 | Discharge: 2021-05-31 | Disposition: A | Discharge status: Home / Self Care | Payer: Medicare Other | Source: Ambulatory Visit | Attending: Nephrology | Admitting: Nephrology

## 2021-05-31 ENCOUNTER — Other Ambulatory Visit: Payer: Self-pay

## 2021-05-31 VITALS — BP 134/88 | HR 60 | Temp 97.1°F | Resp 20

## 2021-05-31 DIAGNOSIS — N170 Acute kidney failure with tubular necrosis: Secondary | ICD-10-CM

## 2021-05-31 DIAGNOSIS — D631 Anemia in chronic kidney disease: Secondary | ICD-10-CM | POA: Diagnosis not present

## 2021-05-31 DIAGNOSIS — N184 Chronic kidney disease, stage 4 (severe): Secondary | ICD-10-CM | POA: Insufficient documentation

## 2021-05-31 LAB — IRON AND TIBC
Iron: 51 ug/dL (ref 28–170)
Saturation Ratios: 15 % (ref 10.4–31.8)
TIBC: 342 ug/dL (ref 250–450)
UIBC: 291 ug/dL

## 2021-05-31 LAB — POCT HEMOGLOBIN-HEMACUE: Hemoglobin: 11.7 g/dL — ABNORMAL LOW (ref 12.0–15.0)

## 2021-05-31 LAB — FERRITIN: Ferritin: 59 ng/mL (ref 11–307)

## 2021-05-31 MED ORDER — DARBEPOETIN ALFA 150 MCG/0.3ML IJ SOSY
PREFILLED_SYRINGE | INTRAMUSCULAR | Status: AC
  Filled 2021-05-31: qty 0.3

## 2021-05-31 MED ORDER — DARBEPOETIN ALFA 150 MCG/0.3ML IJ SOSY
150.0000 ug | PREFILLED_SYRINGE | INTRAMUSCULAR | Status: DC
  Administered 2021-05-31: 150 ug via SUBCUTANEOUS

## 2021-06-01 ENCOUNTER — Encounter: Payer: Self-pay | Admitting: Internal Medicine

## 2021-06-01 ENCOUNTER — Ambulatory Visit (INDEPENDENT_AMBULATORY_CARE_PROVIDER_SITE_OTHER): Payer: Medicare Other | Admitting: Internal Medicine

## 2021-06-01 VITALS — BP 128/74 | HR 68 | Temp 99.3°F | Ht 67.0 in | Wt 219.0 lb

## 2021-06-01 DIAGNOSIS — Z79899 Other long term (current) drug therapy: Secondary | ICD-10-CM | POA: Diagnosis not present

## 2021-06-01 DIAGNOSIS — D849 Immunodeficiency, unspecified: Secondary | ICD-10-CM

## 2021-06-01 DIAGNOSIS — M318 Other specified necrotizing vasculopathies: Secondary | ICD-10-CM | POA: Diagnosis not present

## 2021-06-01 DIAGNOSIS — J439 Emphysema, unspecified: Secondary | ICD-10-CM | POA: Diagnosis not present

## 2021-06-01 DIAGNOSIS — Z7185 Encounter for immunization safety counseling: Secondary | ICD-10-CM

## 2021-06-01 DIAGNOSIS — Z23 Encounter for immunization: Secondary | ICD-10-CM

## 2021-06-01 NOTE — Progress Notes (Signed)
OV 05/26/2018  Subjective:  Patient ID: Catherine Aguilar, female , DOB: 02-19-1956 , age 65 y.o. , MRN: [id number] , ADDRESS: [place] [address]   OV 02/12/2017  Chief Complaint  Patient presents with   Follow-up    Pt states her breathing is doing well. Pt denies any cough, SOB, CP/tightness, and f/c/s.    Follow-up severe mid vasculitis syndrome with life-threatening alveolar hemorrhage and acute renal failure is easter weekend 2018  Currently she is doing well. She has finished 4 doses of Rituxan. Last dose was 01/31/2017. There was significant delay between the first 2 doses which were on schedule in the third and fourth doses delayed. Still on prednisone 60 mg per day. She is accompanied by her daughter [name]. Overall she's doing well. This no hemoptysis anymore. This no shortness of breath. Her G6PD was normal in May 2018. Her Quantiferon Gold was normal. Her most recent chemistries were in May 2018 and a creatinine that improved with 3 mg percent approximately.  OV 05/03/2017  Chief Complaint  Patient presents with   Follow-up    Pt states her breathing is unchanged since last OV. Pt denies cough, CP/tightness, f/c/s.       65 year old female with systemic vasculitis syndrome. Last dose of Rituxan was in May 2018. She is currently tapered her prednisone down to 10 mg per day. She is on Bactrim prophylaxis. She has visited with nephrology Dr. Lu Duffel in the last 1 month. According to her history blood tests have improved. She does not want to have repeat blood test today. Last blood test was within a month. She is getting iron infusions at short stay. According to the daughter blood pressure was high and one of the infusions was held off. There is no hemoptysis chest pain or hematuria or any clinical evidence of vasculitis. She is due for flu shot.   OV 09/19/2017  Chief Complaint  Patient presents with   Follow-up    Pt was seen at the hospital  09/02/17 due to breathing problems and after doing breathing treatments, was sent home. Other than that, pt states that she has been doing good.     65 year old female with systemic vasculitis syndrome.  Last dose of Rituxan May 2018.  She had life-threatening hemoptysis and respiratory failure and renal failure back in early 2018.  Last seen in August 2018.  After that she did not follow-up.  She is here with her daughter.  It appears that Christmas Eve 2018 she decided to smoke cigarettes because of the holidays when she got acute respiratory exacerbation with wheezing and went to the emergency department.  I personally reviewed the chart and the chest x-ray that was clear.  She was discharged with a steroid burst.  Now she is back to baseline prednisone 10 mg/day and her baseline Bactrim for her Wegener's vasculitis.  She tells me that she is getting Rituxan shots once a month at the short stay hospital at [facility] but when I reviewed the chart this is actually Aranesp.  She is due for a B-cell flow cytometry test today because it has been 9-10 months.  Overall she is feeling fine except fatigue.  She tells me that she does not have any eye issues or neuritis of mononeuritis multiplex or renal issues although most recent creatinine continues to be on 3 mg percent.  There is no hemoptysis or shortness of breath currently.   OV 01/21/2018  Chief Complaint  Patient presents with   Follow-up    Pt states she has been doing good since last visit but states she has an uncontrollable cough that she has had since March 2018 that will not go away. Pt is not coughing up any mucus but does have postnasal drainage that is clear.    65 year old female with systemic vasculitis syndrome with life-threatening hemoptysis and respiratory and renal failure in early 2018.Marland Kitchen  Last dose of Rituxan May 2018.  Since then on daily prednisone and Bactrim prophylaxis.  Last B-cell flow cytometry January 2019 showing  suppression of B cells.  She presents with her daughter.  She as usual is a poor historian.  As best as I can gather it appears since her last visit she is having recurrent episodes of bronchitis and has been treated with 2 rounds of antibiotics/2 rounds of prednisone burst including a February 2019 visit to the emergency department where she had a chest x-ray that I personally visualized and it looks clear.  She also feels that she is having a chronic cough that is worse than her baseline.  She tells me now that she has had this cough since March 2018 when she suffered from vasculitis syndrome but the daughter says that it is certainly worse in the last few months since the last visit associated with chest tightness.  There is no hemoptysis or edema.  In the last week or so she feels like she has had an acute sinusitis with yellow sputum and she wants a different antibiotic.  There is no fever or hemoptysis or edema orthopnea proximal nocturnal dyspnea or weight loss. FeNO 8ppb and does not suggest asthma    05/26/2018 -   Chief Complaint  Patient presents with   Follow-up    Pt states she has been doing well since last visit except she states still has the uncontrollable cough that she is unable to get rid of. States she is coughing up clear to light yellow to green mucus and has some occ SOB when exerting herself.    65 year old female with systemic vasculitis syndrome with life-threatening hemoptysis and respiratory and renal failure in early 2018.Marland Kitchen  Last dose of Rituxan May 2018.  Since then on daily prednisone and Bactrim prophylaxis.  Last B-cell flow cytometry January 2019 showing suppression of B cells but in summer 2019 emergence with 3% B cells associated with rising MPO tites in renal clinic summer 2019. Rx for relapose 1027m IV rituxan - May 08, 2018. [facility]   Catherine Aguilar 65y.o. -presents for follow-up. Last seen in May 2019. At that time B-cell flow  cytometry was repeated and showed a 3% B-cell population which was the beginning of emergence following Rituxan a year earlier. At that point in her she was feeling okay but she was still smoking. She also had chronic cough which was controlled by  trelegy inhaler. She subsequently saw Dr. DLorrene Reidin nephrology and it was noticed that her MPO titer was coming back and clinically Dr. DLorrene Reidfelt that she was relapsing. Therefore we arranged for single dose Rituxan thousand milligrams which was administered end of August 2019 without any problems. At this point in time her main issues that she continues to smoke. She has a chronic cough which she says is severe but happens only every few weeks or every several days. She is not taking her inhaler regularly. Review of radiology showed last CT scan of the chestwas early in 2018  and a chest x-ray in February 2019 that was clear. She continues on Bactrim. I do not see prednisone thought at last viit instructed to take pred 68m per day but she says she "finished it" which was not her instruction    OV 09/23/2018  Subjective:  Patient ID: SGwenith Aguilar female , DOB: [birth year], age 65y.o. , MRN: [id number], [place] [id number]  65year old female with systemic vasculitis syndrome with life-threatening hemoptysis and respiratory and renal failure in early 2018..Marland Kitchen Last dose of Rituxan May 2018.  Since then on daily prednisone and Bactrim prophylaxis.  Last B-cell flow cytometry January 2019 showing suppression of B cells but in summer 2019 emergence with 3% B cells associated with rising MPO tites in renal clinic summer 2019. Rx for relapose (based on b cells and creat rise) 10010mIV rituxan - May 08, 2018. [facility] but she is on bactrim for prophylaxis   09/23/2018 -   Chief Complaint  Patient presents with   Follow-up    Pt states she has been doing well since last visit and denies any complaints.      HPI Catherine Aguilar [age].o. -presents for follow-up of the above issue.  She presents by herself.  As usual she is a poor historian.  This is evidenced by the fact that she does not know that she suffers from vasculitis.  She has no idea what this time even means.  She has no idea that she received Rituxan although she recollects receiving infusion.  Overall she tells me that she is well except for mild chronic cough.  She continues to smoke.  Her last Rituxan infusion was in August 2019.  Then in November 2019 she had a CT scan of the chest that shows new findings of coronary artery calcification.  In addition she is noted to have emphysema.  No obvious evidence of any Wegener's vasculitis.  She is up-to-date with her respiratory vaccines.  She continues on Bactrim prophylaxis but is not on any inhaler therapy other than pro-air.  She is not sure about quitting smoking.    OV 05/07/2019  Subjective:  Patient ID: SuGwenith Spitzfemale , DOB: 7/May 30, 1956 age [age].o. , MRN: [id number] [place] [id number] 05/07/2019 -   Chief Complaint  Patient presents with   Systemic Vaculitis Syndrome    No changes in breathing since last visit in January 2020. Medication usage still the same.     [age]ear old female with systemic vasculitis syndrome with life-threatening hemoptysis and respiratory and renal failure in early 2018.s/p induction Rituxan ending  May 2018.  Tthen on daily prednisone and Bactrim prophylaxis.  Last B-cell flow cytometry January 2019 showing suppression of B cells but in summer 2019 emergence with 3% B cells associated with rising MPO tites in renal clinic summer 2019. Rx for relapose (based on b cells and creat rise) 100043mV rituxan - May 08, 2018. [facility] [facility], [facility] 2020 and Aug 2020 but she is on bactrim for prophylaxis  Follow-up vasculitis Follow-up emphysema Follow-up smoking  HPI Catherine Aguilar [age]o. -presents for  follow-up.  Last visit was in January 2020 at which time she was in clinical remission from a vasculitis.  I recommended cardiology follow-up for pulmonary artery calcification.  She had a myocardial perfusion test in February 2020 and this was normal.  She  continues to do well.  Her last Rituxan was in August 2019.  She is now worried that her vasculitis is back because she had one episode of epistaxis when she woke up in the middle of the night this was just brief when she blew her nose.  Other than that she is not had any other episode.  She not having any sinus issues.  No respiratory issues.  She says she is renal and her kidney numbers are actually doing better.  She has chronic kidney disease with a creatinine of over 2 in our health system.  She continues with Bactrim but she is not on prednisone.   For emphysema: Last visit I started on Spiriva but I do not see this:  For smoking: She continues to smoke cigarettes.     OV 02/16/2020  Subjective:  Patient ID: Catherine Aguilar, female , DOB: 11-05-1955 , age 28 y.o. , MRN: [id number] , ADDRESS: [place] [id number]   02/16/2020 -   Chief Complaint  Patient presents with   Follow-up    Pt states she has been doing okay since last visit. States she still becomes SOB and has been coughing a lot. Pt states she will occ cough up beige to light green phlegm.   Follow-up emphysema: On Spiriva Follow-up smoking: Quit summer 2020 Follow-up immunosuppressed status due to vasculitis: On Bactrim prophylaxis Follow-up anemia: On Aranesp monitored by renal. Follow-up vasculitis  - systemic vasculitis syndrome with life-threatening hemoptysis and respiratory and renal failure in early 2018.s/p induction Rituxan ending  May 2018.  Tthen on daily prednisone and Bactrim prophylaxis.  Last B-cell flow cytometry January 2019 showing suppression of B cells but in summer 2019 emergence with 3% B cells associated with rising MPO tites in renal  clinic summer 2019.   -  Rx for relapose (based on b cells and creat rise) 1043m IV rituxan - May 08, 2018.,  80 mg IV x1 October 21, 2019  -Off prednisone because she could not get a refill [[facility] and Aug 2020 and June 2021)  -  On bactrim for prophylaxis  HPI Catherine Aguilar 65y.o. -returns for follow-up.  Last seen in the summer 2020.  Reviewed by myself.  In the interim she tells me that she has quit smoking.  She says dyspnea stable.  Present on exertion relieved by rest.  No hemoptysis no vasculitis symptoms.  She maintains herself on Spiriva.  Initially she said her last Rituxan was in September 2020.  I question the several times to her and she kept repeating it was September 2020.  Later we reviewed the records from Dr. EMadelon Lipsher nephrologist and her last Rituxan was in February 2021.  Moving forward I believe Dr. UHollie Salkwants me to be in charge of the Rituxan.  Earlier this year she had sinusitis symptoms with bloody nasal clots and shortness of breath and wheezing.  She was treated with conventional antibiotics and prednisone by our nurse practitioner.  Dr. UHollie Salkdid give 1 dose of Rituxan 800 mg on October 21, 2019.  Since this Rituxan dosage her sinus symptoms and her respiratory symptoms have improved.  In April 2020 when she had MPO antibody tested by Dr. UHollie Salkand this was elevated.     OV 06/07/2020   Subjective:  Patient ID: SGwenith Aguilar female , DOB: [birth year] age 65y.o. years. , MRN: [id number]  [place] [address] [address]  PCP  Daisy Floro, DO Providers : Treatment Team:  Attending Provider: Brand Males, MD   Chief Complaint  Patient presents with   Follow-up    no complaints     Follow-up emphysema: On Spiriva Follow-up smoking: Quit summer 2020 Followup MJ smoking: admitted this to Sept 2021 (+ve urine march2018) Follow-up immunosuppressed status due to vasculitis: On Bactrim  prophylaxis Follow-up anemia: On Aranesp monitored by renal. Follow-up vasculitis  - systemic vasculitis syndrome with life-threatening hemoptysis and respiratory and renal failure in early 2018.s/p induction Rituxan ending  May 2018.  Tthen on daily prednisone and Bactrim prophylaxis.  Last B-cell flow cytometry January 2019 showing suppression of B cells but in summer 2019 emergence with 3% B cells associated with rising MPO tites in renal clinic summer 2019.   -  Rx for relapose (based on b cells and creat rise) 1060m IV rituxan - May 08, 2018.,  80 mg IV x1 October 21, 2019  -Off prednisone because she could not get a refill [[facility], Jan 2020 and Aug 2020 and June 2021 and sept 2021)  -  On bactrim for prophylaxis   HPI Catherine Aguilar 65y.o. -returns for routine follow-up. In the interim she called in for an exacerbation and we gave her prednisone antibiotic. At this point in time she is not on chronic prednisone. She is on Spiriva. She feels well. No hemoptysis no respiratory complaints. She says she has had a Covid vaccine but is not documented. She says she had [place] She also states that the season she has had her flu and pneumonia vaccines. She is not had a shingles vaccine. She does not realize that she is immunosuppressed and needs a Covid booster. She think she has to wait 6 months for Covid booster but it explained to her the literature about getting a booster on immunosuppression. Her last Rituxan was in Feb 2021. She is willing to get monitoring test labs. She is immunosuppressed.  CT Chest  IMPRESSION: 1. No compelling findings of interstitial lung disease at this time. 2. Small parenchymal bands scattered in the mid to lower lungs bilaterally, left greater than right, most compatible with mild nonspecific postinfectious/postinflammatory scarring. 3. Mild centrilobular emphysema with mild diffuse bronchial wall thickening, suggesting COPD. 4.  Stable mild cardiomegaly and small pericardial effusion. 5. Three-vessel coronary atherosclerosis.   Aortic Atherosclerosis (ICD10-I70.0) and Emphysema (ICD10-J43.9).     Electronically Signed   By: JIlona SorrelM.D.   On: 08/01/2018 13:  ROS - per HPI  Results for HORTENCIA, ASKARI(MRN [id number] as of 02/16/2020 09:19  Ref. Range 05/27/2019 08:46  December 10, 2019 done by renal  Myeloperoxidase Abs Latest Units: AI 2.3 (H)  50.5  Serine Protease 3 Latest Units: AI <1.0  less than 3.5  P-ANCA    1: 640  C ANCA    less than 1: 20   ROS - per HPI   Results for HJACQLYN, MAROLF(MRN [id number] as of 06/07/2020 13:38  Ref. Range 12/05/2017 11:15 01/02/2018 12:17 01/21/2018 10:54 01/30/2018 11:30 03/04/2018 10:31 04/03/2018 10:36 05/08/2018 09:16 05/26/2018 10:50 06/05/2018 10:52 07/03/2018 11:08 07/31/2018 11:30 08/28/2018 11:15 10/16/2018 10:56 11/13/2018 12:00 01/15/2019 10:46 02/12/2019 11:00 04/02/2019 12:07 05/07/2019 10:01 05/27/2019 08:46 05/28/2019 13:09 06/19/2019 12:21 07/31/2019 09:54 08/19/2019 15:17 09/15/2019 10:14 10/13/2019 10:30 11/10/2019 10:22 01/05/2020 10:12 02/02/2020 10:07 02/16/2020 09:19 03/21/2020 09:51 04/18/2020 08:34 05/30/2020 10:45  Creatinine Latest Ref Range: 0.40 - 1.20 mg/dL   2.73 (H)  2.98 (H)          2.66 (H) 3.29 (H)    2.40 (H)      2.76 (H)       OV 11/11/20 with APP Tammy Parerentt  Per Dr. Chase Caller : Discussed with Dr. Hollie Salk of renal.  She says the patient's kidney function is normal.  Blood labs are also okay.  Patient is feeling okay.  However the ANCA vasculitis antibody MPO was going up significantly.  It has been 1 year since patient's last dose of Rituxan.  She feels that the disease is going to get active.  She is recommending another dose of Rituxan within the next 1 month  Case discussed with Dr. Chase Caller.  Dr. Chase Caller spoke with nephrology.  Recent lab work from renal showed MPO antibody is trending up indicative of active vasculitis.  With recommendations to set up a  Rituxan infusion .  Advised to use Rituxan protocol. Reach out to pharmacy [facility] Res for protocol questions.   We will set up as previously done last in February 2021. Renal notes from October 24, 2020 reviewed and showed MPO antibody greater than 100 P ANCA 1: 640 ESR 40 serum creatinine 2.67 GFR 21 hemoglobin 11.8  Patient will be set up for Rituxan  infusion per protocol with Rituxan 800 mg (375 mg/M2 ) IV x1. Predose with Tylenol and Benadryl . Continue on Bactrim DS for PJP proph. 3d/wk  Patient is to follow-up in 4 weeks with Dr. Chase Caller in the office for a follow-up visit.    OV 01/30/2021  Subjective:  Patient ID: Catherine Aguilar, female , DOB: February 28, 1956 , age 53 y.o. , MRN: [id number] , ADDRESS: [address] [place] [address] PCP Daisy Floro, DO Patient Care Team: Daisy Floro, DO as PCP - General (Family Medicine)  This Provider for this visit: Treatment Team:  Attending Provider: Brand Males, MD    01/30/2021 -   Chief Complaint  Patient presents with   Follow-up    Pt states she has been doing okay since last visit. States if she gets hot, she will have an uncontrollable cough and will occ cough up phlegm.    Follow-up emphysema: On Spiriva Follow-up smoking: Quit summer 2020 Followup MJ smoking: admitted this to Sept 2021 (+ve urine march2018) Follow-up immunosuppressed status due to vasculitis: On Bactrim prophylaxis Follow-up anemia: On Aranesp monitored by renal. Follow-up vasculitis  - systemic vasculitis syndrome with life-threatening hemoptysis and respiratory and renal failure in early 2018.s/p induction Rituxan ending  May 2018.  Tthen on daily prednisone and Bactrim prophylaxis.  Last B-cell flow cytometry January 2019 showing suppression of B cells but in summer 2019 emergence with 3% B cells associated with rising MPO tites in renal clinic summer 2019.   -  Rx for relapose (based on b cells and creat rise) 1079m IV rituxan -  May 08, 2018.,  80 mg IV x1 October 21, 2019 -> repeat March 2022  -Off prednisone because she could not get a refill [[facility], Jan 2020 and Aug 2020 and June 2021,  sept 2021, may 2022)  -  On bactrim for prophylaxis   Quantiferon gold - negtive Sept 2021  HPI Catherine Aguilar 65y.o. -returns for follow-up.  Last seen by myself in September 2021.  After that she was seen by nurse practitioner in March 2022.  The spring 2022 visit was following nephrology concerns of the vasculitis was reemerging.  Patient  was able to see nurse practitioner and we set her up for Rituxan infusion.  She says she has gotten the Rituxan infusion mid March 2022.  After that her sinus symptoms have improved a lot.  She says she feels good except when she exerts the summer heat gets hot then she starts coughing.  She continues to smoke marijuana.  She is refusing to quit.  She uses albuterol as needed.  She is not using her Spiriva on a regular basis.  She is unclear why she is not using Spiriva on a regular basis.  She says she has a lot of inhalers at home and she does not know them other than albuterol.  She does not recollect the fact she has been prescribed Spiriva in the past and instructed to take it on a regular basis.  She is willing to give this a try.  There are no other new issues.  Overall health status is stable.  In terms of vaccination: She says she has had of 4 shots of COVID-vaccine.  Unclear if she got monoclonal antibody prophylaxis.  Last time when she was here we checked her COVID IgG and it was negative and I recommended monoclonal antibody prophylaxis but she does not remember this.  She is willing to have a COVID IgG checked.  Also unclear if she has had a shingles vaccine.  Gave instructions about it last time.      OV 06/01/2021  Subjective:  Patient ID: Catherine Aguilar, female , DOB: 1955-11-25 , age 63 y.o. , MRN: [id number] , ADDRESS: [place]  [id number] PCP Donney Dice, DO Patient Care Team: Donney Dice, DO as PCP - General (Family Medicine)  This Provider for this visit: Treatment Team:  Attending Provider: Brand Males, MD    06/01/2021 -   Chief Complaint  Patient presents with   Follow-up    Pt states she has been doing okay since last visit and denies any complaints.    Follow-up emphysema: On Spiriva Follow-up smoking: Quit summer 2020 Followup MJ smoking: admitted this to Sept 2021 and may 2022 (+ve urine march2018) Follow-up immunosuppressed status due to vasculitis: On Bactrim prophylaxis Follow-up anemia: On Aranesp monitored by renal. Follow-up vasculitis  - systemic vasculitis syndrome with life-threatening hemoptysis and respiratory and renal failure in early 2018.s/p induction Rituxan ending  May 2018.  Tthen on daily prednisone and Bactrim prophylaxis.  Last B-cell flow cytometry January 2019 showing suppression of B cells but in summer 2019 emergence with 3% B cells associated with rising MPO tites in renal clinic summer 2019.   -  Rx for relapose (based on b cells and creat rise) 1046m IV rituxan - May 08, 2018.,  80 mg IV x1 October 21, 2019 -> repeat March 2022  -Off prednisone because she could not get a refill [[facility], Jan 2020 and Aug 2020 and June 2021,  sept 2021, may 2022)  -  On bactrim for prophylaxis   Quantiferon gold - negtive Sept 2021  HPI Catherine Aguilar 65y.o. -returns for follow-up 6 months.  She is doing well.  She occasionally uses marijuana because it helps sensory symptoms in her right lower extremity.  Otherwise she is feeling well no complaints.  She has not had a COVID booster.  She will have a high-dose flu shot today.  She believes she gets Rituxan once a year.  Last one was in spring 2022.  At this point in  time no hemoptysis or dysuria or hematuria.  No arthritis no rashes.  Last set of labs was in spring 2022.  She does not feel inclination  to quit marijuana.  She continues with Spiriva for the emphysema.    CT Chest data  No results found.    PFT  No flowsheet data found.     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), CVA (cerebral vascular accident) ([place]) (07/03/2012), Diffuse pulmonary alveolar hemorrhage, Edema of right lower extremity (03/25/2017), Essential hypertension (12/08/2016), Hemoptysis (12/08/2016), High cholesterol, HLD (hyperlipidemia) (12/08/2016), adenomatous polyp of colon (04/16/2017), Hypertension, Hypokalemia (07/02/2012), Intra-alveolar hemorrhage (12/08/2016), Left facial numbness (07/02/2012), Sepsis ([place]) (12/08/2016), Shortness of breath (12/02/2017), Stroke [facility]), Substance abuse ([place]) (12/08/2016), Systemic vasculitis syndrome ([place]) (12/08/2016), Tobacco abuse (12/08/2016), Type 2 diabetes mellitus without complication, with long-term current use of insulin ([place]) (02/11/2017), and Wegener's granulomatosis with renal involvement ([place]) (12/08/2016).   reports that she quit smoking about 2 years ago. Her smoking use included cigarettes. She started smoking about 52 years ago. She has a 9.80 pack-year smoking history. She has never used smokeless tobacco.  Past Surgical History:  Procedure Laterality Date   ABDOMINAL HYSTERECTOMY  1985    No Known Allergies  Immunization History  Administered Date(s) Administered   Influenza Split 07/02/2012   Influenza,inj,Quad PF,6+ Mos 05/03/2017, 05/13/2018, 06/08/2019   Pneumococcal Polysaccharide-23 07/02/2012, 11/22/2017, 05/30/2020    Family History  Problem Relation Age of Onset    Cancer Father    Alcohol abuse Father    Diabetes Father    Hypertension Father    Heart attack Mother    Diabetes Mother    Hypertension Mother    Stroke Mother    Breast cancer Neg Hx    Colon cancer Neg Hx    Esophageal cancer Neg Hx    Rectal cancer Neg Hx    Stomach cancer Neg Hx    Pancreatic cancer Neg Hx      Current Outpatient Medications:    albuterol (PROVENTIL) (2.5 MG/3ML) 0.083% nebulizer solution, Take 3 mLs (2.5 mg total) by nebulization every 6 (six) hours as needed for wheezing or shortness of breath., Disp: 150 mL, Rfl: 1   albuterol (VENTOLIN HFA) 108 (90 Base) MCG/ACT inhaler, INHALE 2 PUFFS BY MOUTH EVERY 4-6 HOURS, Disp: 8.5 each, Rfl: 2   aspirin 81 MG EC tablet, TAKE 1 TABLET BY MOUTH EVERY DAY (Patient taking differently: Take 81 mg by mouth daily.), Disp: 32 tablet, Rfl: 12   B-D UF III MINI PEN NEEDLES 31G X 5 MM MISC, INJECT 1 EACH INTO THE SKIN 3 (THREE) TIMES DAILY., Disp: 100 each, Rfl: 3   benzonatate (TESSALON) 200 MG capsule, Take 1 capsule (200 mg total) by mouth 3 (three) times daily as needed for cough., Disp: 30 capsule, Rfl: 1   cetirizine (ZYRTEC) 10 MG tablet, TAKE 1 TABLET BY MOUTH EVERY DAY (Patient taking differently: Take 10 mg by mouth daily as needed for allergies.), Disp: 90 tablet, Rfl: 2   docusate sodium (COLACE) 100 MG capsule, Take 300 mg by mouth daily., Disp: , Rfl:    fluticasone (FLONASE) 50 MCG/ACT nasal spray, Place 2 sprays into both nostrils daily. (Patient taking differently: Place 2 sprays  into both nostrils daily as needed for allergies or rhinitis.), Disp: 16 g, Rfl: 2   glucose blood (ACCU-CHEK GUIDE) test strip, Check blood glucose prior to lantus administration, Disp: 100 each, Rfl: 12   hydrALAZINE (APRESOLINE) 100 MG tablet, Take 100 mg by mouth 3 (three) times daily., Disp: , Rfl:    HYDROcodone-acetaminophen (NORCO) 5-325 MG tablet, Take 2 tablets by mouth every 4 (four) hours as needed., Disp: 10 tablet, Rfl: 0    insulin glargine (LANTUS SOLOSTAR) 100 UNIT/ML Solostar Pen, Inject 5 Units into the skin at bedtime., Disp: 3 mL, Rfl: 0   labetalol (NORMODYNE) 300 MG tablet, Take 300 mg by mouth 2 (two) times daily., Disp: , Rfl:    Lancets Misc. (ACCU-CHEK SOFTCLIX LANCET DEV) KIT, Check BG prior to administering insulin, Disp: 1 kit, Rfl: 3   LINZESS 72 MCG capsule, TAKE 1 CAPSULE (72 MCG TOTAL) BY MOUTH DAILY BEFORE BREAKFAST. (Patient taking differently: Take 72 mcg by mouth daily as needed (constipation).), Disp: 30 capsule, Rfl: 0   pravastatin (PRAVACHOL) 40 MG tablet, TAKE 1 TABLET BY MOUTH EVERY DAY AT BEDTIME FOR CHOLESTEROL (Patient taking differently: Take 40 mg by mouth at bedtime.), Disp: 90 tablet, Rfl: 3   predniSONE (DELTASONE) 10 MG tablet, Take 2 tablets (20 mg total) by mouth 2 (two) times daily with a meal., Disp: 20 tablet, Rfl: 0   sodium bicarbonate 650 MG tablet, Take 2 tablets (1,300 mg total) by mouth 2 (two) times daily., Disp: 60 tablet, Rfl: 1   sulfamethoxazole-trimethoprim (BACTRIM DS) 800-160 MG tablet, Take 1 tablet by mouth 3 (three) times a week. (Patient taking differently: Take 1 tablet by mouth every Monday, Wednesday, and Friday.), Disp: 12 tablet, Rfl: 5   Tiotropium Bromide Monohydrate (SPIRIVA RESPIMAT) 1.25 MCG/ACT AERS, Inhale 2 puffs into the lungs daily., Disp: 4 g, Rfl: 6      Objective:   Vitals:   06/01/21 1458  BP: 128/74  Pulse: 68  Temp: 99.3 F (37.4 C)  TempSrc: Oral  SpO2: 94%  Weight: 219 lb (99.3 kg)  Height: _0  (1.702 m)    Estimated body mass index is 34.3 kg/m as calculated from the following:   Height as of this encounter: _1  (1.702 m).   Weight as of this encounter: 219 lb (99.3 kg).  _2 @  [employer]   06/01/21 1458  Weight: 219 lb (99.3 kg)     Physical Exam  General: No distress. Looks well Neuro: Alert and Oriented x 3. GCS 15. Speech normal Psych: Pleasant Resp:  Barrel Chest - no.  Wheeze - no,  Crackles - no, No overt respiratory distress CVS: Normal heart sounds. Murmurs - no Ext: Stigmata of Connective Tissue Disease - no HEENT: Normal upper airway. PEERL +. No post nasal drip        Assessment:       ICD-10-CM   1. Systemic vasculitis syndrome ([place])  M31.8     2. Immunosuppressed status ([place])  D84.9     3. High risk medication use  Z79.899     4. Vaccine counseling  Z71.85     5. Pulmonary emphysema, unspecified emphysema type ([place])  J43.9          Plan:     Patient Instructions     VAsculitis Immunosuppressed status Sp Rituxan repeat March 2022  Clinically vasculitis in remission Dyspne is mild and stable and likely due to weight, deconditioning and mild associated emphysema  Plan  - check  bmet, lft, cbc 06/01/2021   Covid vaccine counseling Covid IgG negative sept 2021  Plan - strongly recommend new covid bivalent mRNA booster to Omicron and BA.5    Emphysema   - stable  plan - continue spiriva respimat daily -albuterolas needed   Marijuana use  - this is hurting your lung in long run thought seems to help sensation in leg  plan  - please quit aSAP  Followup  -6 months or sooner If neede; 15 min slot.td    SIGNATURE    Dr. Brand Males, M.D., F.C.C.P,  Pulmonary and Critical Care Medicine Staff Physician, [place] Director - Interstitial Lung Disease  Program  Pulmonary [place] at [place], [place], [address]  Pager: [phone], If no answer or between  15:00h - 7:00h: call [phone] Telephone: [phone]  3:39 PM 06/01/2021

## 2021-06-01 NOTE — Patient Instructions (Addendum)
    VAsculitis Immunosuppressed status Sp Rituxan repeat March 2022  Clinically vasculitis in remission Dyspne is mild and stable and likely due to weight, deconditioning and mild associated emphysema  Plan  - check bmet, lft, cbc 06/01/2021   Covid vaccine counseling Covid IgG negative sept 2021  Plan - strongly recommend new covid bivalent mRNA booster to Omicron and BA.5 - high dose flu shot 06/01/2021     Emphysema   - stable  plan - continue spiriva respimat daily -albuterolas needed   Marijuana use  - this is hurting your lung in long run thought seems to help sensation in leg  plan  - please quit aSAP  Followup  -6 months or sooner If neede; 15 min slot.td

## 2021-06-02 ENCOUNTER — Telehealth: Payer: Self-pay | Admitting: Internal Medicine

## 2021-06-02 LAB — HEPATIC FUNCTION PANEL
ALT: 15 U/L (ref 0–35)
AST: 16 U/L (ref 0–37)
Albumin: 4.1 g/dL (ref 3.5–5.2)
Alkaline Phosphatase: 59 U/L (ref 39–117)
Bilirubin, Direct: 0.1 mg/dL (ref 0.0–0.3)
Total Bilirubin: 0.3 mg/dL (ref 0.2–1.2)
Total Protein: 6.4 g/dL (ref 6.0–8.3)

## 2021-06-02 LAB — BASIC METABOLIC PANEL
BUN: 43 mg/dL — ABNORMAL HIGH (ref 6–23)
CO2: 23 mEq/L (ref 19–32)
Calcium: 9.2 mg/dL (ref 8.4–10.5)
Chloride: 105 mEq/L (ref 96–112)
Creatinine, Ser: 3.5 mg/dL — ABNORMAL HIGH (ref 0.40–1.20)
GFR: 13.08 mL/min — CL (ref >60.00)
Glucose, Bld: 125 mg/dL — ABNORMAL HIGH (ref 70–99)
Potassium: 4 mEq/L (ref 3.5–5.1)
Sodium: 139 mEq/L (ref 135–145)

## 2021-06-02 LAB — CBC WITH DIFFERENTIAL/PLATELET
Basophils Absolute: 0.1 10*3/uL (ref 0.0–0.1)
Basophils Relative: 1 % (ref 0.0–3.0)
Eosinophils Absolute: 0.1 10*3/uL (ref 0.0–0.7)
Eosinophils Relative: 1.4 % (ref 0.0–5.0)
HCT: 36.3 % (ref 36.0–46.0)
Hemoglobin: 11.3 g/dL — ABNORMAL LOW (ref 12.0–15.0)
Lymphocytes Relative: 27 % (ref 12.0–46.0)
Lymphs Abs: 1.9 10*3/uL (ref 0.7–4.0)
MCHC: 31.2 g/dL (ref 30.0–36.0)
MCV: 83 fl (ref 78.0–100.0)
Monocytes Absolute: 0.7 10*3/uL (ref 0.1–1.0)
Monocytes Relative: 9.7 % (ref 3.0–12.0)
Neutro Abs: 4.3 10*3/uL (ref 1.4–7.7)
Neutrophils Relative %: 60.9 % (ref 43.0–77.0)
Platelets: 225 10*3/uL (ref 150.0–400.0)
RBC: 4.37 Mil/uL (ref 3.87–5.11)
RDW: 14.9 % (ref 11.5–15.5)
WBC: 7 10*3/uL (ref 4.0–10.5)

## 2021-06-02 NOTE — Telephone Encounter (Signed)
Attempted to call pt but unable to reach. Left message for her to return call. 

## 2021-06-02 NOTE — Telephone Encounter (Signed)
Got a critical alert on his GFR.  Her creatinine is 3.5 mg percent.  This is very similar to May 2022 but in June 2022 it was 3.05 mg percent.  Therefore think it is range bound.  Please let the patient know.  She has to follow-up with Dr. Hollie Salk her renal doctor  LABS    PULMONARY No results for input(s): PHART, PCO2ART, PO2ART, HCO3, TCO2, O2SAT in the last 168 hours.  Invalid input(s): PCO2, PO2  CBC Recent Labs  Lab 05/31/21 1243 06/01/21 1551  HGB 11.7* 11.3*  HCT  --  36.3  WBC  --  7.0  PLT  --  225.0    COAGULATION No results for input(s): INR in the last 168 hours.  CARDIAC  No results for input(s): TROPONINI in the last 168 hours. No results for input(s): PROBNP in the last 168 hours.   CHEMISTRY Recent Labs  Lab 06/01/21 1551  NA 139  K 4.0  CL 105  CO2 23  GLUCOSE 125*  BUN 43*  CREATININE 3.50*  CALCIUM 9.2   Estimated Creatinine Clearance: 19.1 mL/min (A) (by C-G formula based on SCr of 3.5 mg/dL (H)).   LIVER Recent Labs  Lab 06/01/21 1551  AST 16  ALT 15  ALKPHOS 59  BILITOT 0.3  PROT 6.4  ALBUMIN 4.1     INFECTIOUS No results for input(s): LATICACIDVEN, PROCALCITON in the last 168 hours.   ENDOCRINE CBG (last 3)  No results for input(s): GLUCAP in the last 72 hours.       IMAGING x48h  - image(s) personally visualized  -   highlighted in bold No results found.

## 2021-06-05 ENCOUNTER — Other Ambulatory Visit: Payer: Self-pay | Admitting: Adult Health

## 2021-06-05 NOTE — Telephone Encounter (Signed)
Called and spoke with pt letting her know the results of labwork and she verbalized understanding. Nothing further needed. 

## 2021-06-06 ENCOUNTER — Telehealth: Payer: Self-pay | Admitting: Internal Medicine

## 2021-06-06 NOTE — Telephone Encounter (Signed)
Last Ov note has been faxed to Dr. Hollie Salk via epic.  Patient is aware and voiced her understanding.  Nothing further needed at this time.

## 2021-06-26 ENCOUNTER — Other Ambulatory Visit: Payer: Self-pay

## 2021-06-26 ENCOUNTER — Encounter: Payer: Self-pay | Admitting: Family Medicine

## 2021-06-26 ENCOUNTER — Ambulatory Visit (INDEPENDENT_AMBULATORY_CARE_PROVIDER_SITE_OTHER): Payer: Medicare Other | Admitting: Family Medicine

## 2021-06-26 ENCOUNTER — Ambulatory Visit (INDEPENDENT_AMBULATORY_CARE_PROVIDER_SITE_OTHER): Payer: Medicare Other

## 2021-06-26 VITALS — BP 167/90 | HR 61 | Wt 218.8 lb

## 2021-06-26 DIAGNOSIS — Z23 Encounter for immunization: Secondary | ICD-10-CM | POA: Diagnosis not present

## 2021-06-26 DIAGNOSIS — E119 Type 2 diabetes mellitus without complications: Secondary | ICD-10-CM

## 2021-06-26 DIAGNOSIS — I10 Essential (primary) hypertension: Secondary | ICD-10-CM | POA: Diagnosis not present

## 2021-06-26 DIAGNOSIS — Z794 Long term (current) use of insulin: Secondary | ICD-10-CM

## 2021-06-26 LAB — POCT GLYCOSYLATED HEMOGLOBIN (HGB A1C): HbA1c, POC (controlled diabetic range): 6.8 % (ref 0.0–7.0)

## 2021-06-26 NOTE — Assessment & Plan Note (Addendum)
-  BP 167/90 and remained elevated on recheck, not at goal, possibly white coat hypertension as patient's BP consistently appropriate at home -continue current antihypertensive regimen of hydralazine and labetalol since patient denies changes to current regimen  -instructed to check BP regularly at home and bring record at next visit, follow up in 2 weeks, proper guidelines on BP at home discussed  -ED precautions discussed

## 2021-06-26 NOTE — Patient Instructions (Addendum)
It was great seeing you today!  Today we discussed your blood pressure and diabetes. Your blood pressure was initially a little high, please make sure to take your blood pressures medications as prescribed daily. Try to eat a balanced diet full of vegetables and maintain regular physical activity. Your A1c is 6.8, we will not make any changes today. Please continue on your current diabetes regimen. Make sure to book an appointment with the eye doctor early next year, as we discussed it is very important to have annual eye exams.   Please record your blood pressures daily at home and bring in this record to your next visit.  Please follow up at your next scheduled appointment in 2 weeks, if anything arises between now and then, please don't hesitate to contact our office.   Thank you for allowing [place] to be a part of your medical care!  Thank you, Dr. Larae Grooms

## 2021-06-26 NOTE — Assessment & Plan Note (Signed)
-  due for A1c, today 6.8 which is appropriate <7, discussed with patient -continue current insulin regimen -encouraged to make appointment for ophthalmology exam early 2023, emphasized importance of annual eye exams -diet and exercise counseling provided along with reassurance -foot exam performed today

## 2021-06-26 NOTE — Progress Notes (Signed)
    SUBJECTIVE:   Hypertension Compliant on regimen of labetalol and hydralazine which she endorses taking for years prior. Denies chest pain, dyspnea or leg swelling. Checks blood pressures at home occasionally which normally range around 140/70s on average. Does not always eat a balanced diet consistently. Also denies vision changes, headaches and weakness.  Type 2 DM Compliant on lantus 5 units at bedtime. Does not check her glucose levels at home. Denies fatigue, weakness or other symptoms indicative of hypoglycemic episodes. Last saw ophthalmologist in January earlier this year.   OBJECTIVE:   BP (!) 167/90   Pulse 61   Wt 218 lb 12.8 oz (99.2 kg)   SpO2 96%   BMI 34.27 kg/m   General: Patient well-appearing, in no acute distress. CV: RRR, no murmurs or gallops auscultated Resp: CTAB, no wheezing, rales or rhonchi noted Abdomen: soft, nontender, presence of bowel sounds Ext: normal foot exam without wounds, gross sensation intact and microfilament testing normal, normal reflexes, no LE edema noted bilaterally, radial and distal pulses strong and equal bilaterally  Neuro: normal gait Psych: mood appropriate   ASSESSMENT/PLAN:   Essential hypertension -BP 167/90 and remained elevated on recheck, not at goal, possibly white coat hypertension as patient's BP consistently appropriate at home -continue current antihypertensive regimen of hydralazine and labetalol since patient denies changes to current regimen  -instructed to check BP regularly at home and bring record at next visit, follow up in 2 weeks, proper guidelines on BP at home discussed  -ED precautions discussed   Type 2 diabetes mellitus without complication, with long-term current use of insulin ([place]) -due for A1c, today 6.8 which is appropriate <7, discussed with patient -continue current insulin regimen -encouraged to make appointment for ophthalmology exam early 2023, emphasized importance of annual eye  exams -diet and exercise counseling provided along with reassurance -foot exam performed today    -Patient politely declines influenza and shingles vaccines.  Donney Dice, [place]

## 2021-06-28 ENCOUNTER — Other Ambulatory Visit: Payer: Self-pay

## 2021-06-28 ENCOUNTER — Ambulatory Visit (HOSPITAL_COMMUNITY)
Admission: RE | Admit: 2021-06-28 | Discharge: 2021-06-28 | Disposition: A | Discharge status: Home / Self Care | Payer: Medicare Other | Source: Ambulatory Visit | Attending: Nephrology | Admitting: Nephrology

## 2021-06-28 VITALS — BP 167/82 | HR 58 | Temp 98.3°F | Resp 20

## 2021-06-28 DIAGNOSIS — N170 Acute kidney failure with tubular necrosis: Secondary | ICD-10-CM

## 2021-06-28 DIAGNOSIS — D631 Anemia in chronic kidney disease: Secondary | ICD-10-CM | POA: Insufficient documentation

## 2021-06-28 DIAGNOSIS — N184 Chronic kidney disease, stage 4 (severe): Secondary | ICD-10-CM | POA: Diagnosis not present

## 2021-06-28 LAB — POCT HEMOGLOBIN-HEMACUE: Hemoglobin: 12.2 g/dL (ref 12.0–15.0)

## 2021-06-28 LAB — IRON AND TIBC
Iron: 54 ug/dL (ref 28–170)
Saturation Ratios: 16 % (ref 10.4–31.8)
TIBC: 339 ug/dL (ref 250–450)
UIBC: 285 ug/dL

## 2021-06-28 LAB — FERRITIN: Ferritin: 56 ng/mL (ref 11–307)

## 2021-06-28 MED ORDER — DARBEPOETIN ALFA 150 MCG/0.3ML IJ SOSY: 150.0000 ug | PREFILLED_SYRINGE | INTRAMUSCULAR | Status: DC

## 2021-07-20 DIAGNOSIS — I129 Hypertensive chronic kidney disease with stage 1 through stage 4 chronic kidney disease, or unspecified chronic kidney disease: Secondary | ICD-10-CM | POA: Diagnosis not present

## 2021-07-20 DIAGNOSIS — N184 Chronic kidney disease, stage 4 (severe): Secondary | ICD-10-CM | POA: Diagnosis not present

## 2021-07-20 DIAGNOSIS — E1122 Type 2 diabetes mellitus with diabetic chronic kidney disease: Secondary | ICD-10-CM | POA: Diagnosis not present

## 2021-07-20 DIAGNOSIS — D649 Anemia, unspecified: Secondary | ICD-10-CM | POA: Diagnosis not present

## 2021-07-20 DIAGNOSIS — M3131 Wegener's granulomatosis with renal involvement: Secondary | ICD-10-CM | POA: Diagnosis not present

## 2021-07-25 ENCOUNTER — Other Ambulatory Visit (HOSPITAL_COMMUNITY): Payer: Self-pay

## 2021-07-26 ENCOUNTER — Other Ambulatory Visit: Payer: Self-pay

## 2021-07-26 ENCOUNTER — Ambulatory Visit (HOSPITAL_COMMUNITY)
Admission: RE | Admit: 2021-07-26 | Discharge: 2021-07-26 | Disposition: A | Discharge status: Home / Self Care | Payer: Medicare Other | Source: Ambulatory Visit | Attending: Nephrology | Admitting: Nephrology

## 2021-07-26 VITALS — BP 171/88 | HR 53 | Temp 97.3°F | Resp 18

## 2021-07-26 DIAGNOSIS — N170 Acute kidney failure with tubular necrosis: Secondary | ICD-10-CM | POA: Diagnosis not present

## 2021-07-26 LAB — POCT HEMOGLOBIN-HEMACUE: Hemoglobin: 11.1 g/dL — ABNORMAL LOW (ref 12.0–15.0)

## 2021-07-26 MED ORDER — DARBEPOETIN ALFA 100 MCG/0.5ML IJ SOSY
PREFILLED_SYRINGE | INTRAMUSCULAR | Status: AC
  Filled 2021-07-26: qty 0.5

## 2021-07-26 MED ORDER — DARBEPOETIN ALFA 100 MCG/0.5ML IJ SOSY
100.0000 ug | PREFILLED_SYRINGE | INTRAMUSCULAR | Status: DC
  Administered 2021-07-26: 100 ug via SUBCUTANEOUS

## 2021-07-26 MED ORDER — FERUMOXYTOL INJECTION 510 MG/17 ML
510.0000 mg | INTRAVENOUS | Status: DC
  Administered 2021-07-26: 510 mg via INTRAVENOUS
  Filled 2021-07-26: qty 510

## 2021-08-04 ENCOUNTER — Encounter (HOSPITAL_COMMUNITY): Payer: Medicare Other

## 2021-08-07 ENCOUNTER — Encounter (HOSPITAL_COMMUNITY)
Admission: RE | Admit: 2021-08-07 | Discharge: 2021-08-07 | Disposition: A | Discharge status: Home / Self Care | Payer: Medicare Other | Source: Ambulatory Visit | Attending: Nephrology | Admitting: Nephrology

## 2021-08-07 DIAGNOSIS — N189 Chronic kidney disease, unspecified: Secondary | ICD-10-CM | POA: Diagnosis not present

## 2021-08-07 DIAGNOSIS — D631 Anemia in chronic kidney disease: Secondary | ICD-10-CM | POA: Insufficient documentation

## 2021-08-07 MED ORDER — SODIUM CHLORIDE 0.9 % IV SOLN
510.0000 mg | INTRAVENOUS | Status: DC
  Administered 2021-08-07: 13:00:00 510 mg via INTRAVENOUS
  Filled 2021-08-07: qty 510

## 2021-08-08 NOTE — Telephone Encounter (Signed)
Spoke with patient . Made appt for Jan 6th at 10:10. For follow up and med refills Salvatore Marvel, CMA

## 2021-08-11 ENCOUNTER — Other Ambulatory Visit: Payer: Self-pay

## 2021-08-11 MED ORDER — PRAVASTATIN SODIUM 40 MG PO TABS: ORAL_TABLET | ORAL | 3 refills | Status: DC

## 2021-08-23 ENCOUNTER — Ambulatory Visit (HOSPITAL_COMMUNITY)
Admission: RE | Admit: 2021-08-23 | Discharge: 2021-08-23 | Disposition: A | Discharge status: Home / Self Care | Payer: Medicare Other | Source: Ambulatory Visit | Attending: Nephrology | Admitting: Nephrology

## 2021-08-23 ENCOUNTER — Other Ambulatory Visit: Payer: Self-pay

## 2021-08-23 VITALS — BP 181/82 | HR 55

## 2021-08-23 DIAGNOSIS — D631 Anemia in chronic kidney disease: Secondary | ICD-10-CM | POA: Diagnosis not present

## 2021-08-23 DIAGNOSIS — N184 Chronic kidney disease, stage 4 (severe): Secondary | ICD-10-CM | POA: Diagnosis not present

## 2021-08-23 DIAGNOSIS — N170 Acute kidney failure with tubular necrosis: Secondary | ICD-10-CM

## 2021-08-23 LAB — IRON AND TIBC
Iron: 83 ug/dL (ref 28–170)
Saturation Ratios: 27 % (ref 10.4–31.8)
TIBC: 311 ug/dL (ref 250–450)
UIBC: 228 ug/dL

## 2021-08-23 LAB — POCT HEMOGLOBIN-HEMACUE: Hemoglobin: 12.2 g/dL (ref 12.0–15.0)

## 2021-08-23 LAB — FERRITIN: Ferritin: 319 ng/mL — ABNORMAL HIGH (ref 11–307)

## 2021-08-23 MED ORDER — DARBEPOETIN ALFA 100 MCG/0.5ML IJ SOSY: 100.0000 ug | PREFILLED_SYRINGE | INTRAMUSCULAR | Status: DC

## 2021-08-23 MED ORDER — DARBEPOETIN ALFA 100 MCG/0.5ML IJ SOSY
PREFILLED_SYRINGE | INTRAMUSCULAR | Status: AC
  Filled 2021-08-23: qty 0.5

## 2021-09-13 ENCOUNTER — Encounter (HOSPITAL_COMMUNITY): Payer: Self-pay | Admitting: *Deleted

## 2021-09-15 ENCOUNTER — Ambulatory Visit: Payer: Medicare Other | Admitting: Family Medicine

## 2021-09-20 ENCOUNTER — Encounter (HOSPITAL_COMMUNITY): Payer: Medicare Other

## 2021-09-26 ENCOUNTER — Telehealth: Payer: Self-pay | Admitting: Internal Medicine

## 2021-09-26 ENCOUNTER — Other Ambulatory Visit: Payer: Self-pay

## 2021-09-26 ENCOUNTER — Ambulatory Visit (HOSPITAL_COMMUNITY)
Admission: RE | Admit: 2021-09-26 | Discharge: 2021-09-26 | Disposition: A | Discharge status: Home / Self Care | Payer: 59 | Source: Ambulatory Visit | Attending: Nephrology | Admitting: Nephrology

## 2021-09-26 VITALS — BP 167/80 | HR 69 | Temp 97.6°F | Resp 18

## 2021-09-26 DIAGNOSIS — N184 Chronic kidney disease, stage 4 (severe): Secondary | ICD-10-CM | POA: Diagnosis present

## 2021-09-26 DIAGNOSIS — D631 Anemia in chronic kidney disease: Secondary | ICD-10-CM | POA: Diagnosis not present

## 2021-09-26 DIAGNOSIS — N170 Acute kidney failure with tubular necrosis: Secondary | ICD-10-CM

## 2021-09-26 LAB — IRON AND TIBC
Iron: 92 ug/dL (ref 28–170)
Saturation Ratios: 31 % (ref 10.4–31.8)
TIBC: 294 ug/dL (ref 250–450)
UIBC: 202 ug/dL

## 2021-09-26 LAB — FERRITIN: Ferritin: 444 ng/mL — ABNORMAL HIGH (ref 11–307)

## 2021-09-26 LAB — POCT HEMOGLOBIN-HEMACUE: Hemoglobin: 10.5 g/dL — ABNORMAL LOW (ref 12.0–15.0)

## 2021-09-26 MED ORDER — DARBEPOETIN ALFA 100 MCG/0.5ML IJ SOSY
100.0000 ug | PREFILLED_SYRINGE | INTRAMUSCULAR | Status: DC
  Administered 2021-09-26: 100 ug via SUBCUTANEOUS

## 2021-09-26 MED ORDER — DARBEPOETIN ALFA 100 MCG/0.5ML IJ SOSY
PREFILLED_SYRINGE | INTRAMUSCULAR | Status: AC
  Filled 2021-09-26: qty 0.5

## 2021-09-26 NOTE — Telephone Encounter (Signed)
Called patient and spoke with patient briefly. She stated that she needs a refill on her aspirin. Upon reviewing her chart, this has never been ordered by MR. Our call disconnected and I was not able to reach her again. Will call back later today.

## 2021-10-10 ENCOUNTER — Encounter (HOSPITAL_COMMUNITY): Payer: 59

## 2021-10-17 ENCOUNTER — Encounter (HOSPITAL_COMMUNITY): Payer: Self-pay | Admitting: *Deleted

## 2021-10-24 ENCOUNTER — Other Ambulatory Visit: Payer: Self-pay

## 2021-10-24 ENCOUNTER — Ambulatory Visit (HOSPITAL_COMMUNITY)
Admission: RE | Admit: 2021-10-24 | Discharge: 2021-10-24 | Disposition: A | Discharge status: Home / Self Care | Payer: Medicare Other | Source: Ambulatory Visit | Attending: Nephrology | Admitting: Nephrology

## 2021-10-24 VITALS — BP 165/80 | HR 75 | Temp 99.1°F | Resp 18

## 2021-10-24 DIAGNOSIS — N170 Acute kidney failure with tubular necrosis: Secondary | ICD-10-CM

## 2021-10-24 DIAGNOSIS — D631 Anemia in chronic kidney disease: Secondary | ICD-10-CM | POA: Insufficient documentation

## 2021-10-24 DIAGNOSIS — N189 Chronic kidney disease, unspecified: Secondary | ICD-10-CM | POA: Insufficient documentation

## 2021-10-24 LAB — FERRITIN: Ferritin: 276 ng/mL (ref 11–307)

## 2021-10-24 LAB — IRON AND TIBC
Iron: 80 ug/dL (ref 28–170)
Saturation Ratios: 25 % (ref 10.4–31.8)
TIBC: 323 ug/dL (ref 250–450)
UIBC: 243 ug/dL

## 2021-10-24 LAB — POCT HEMOGLOBIN-HEMACUE: Hemoglobin: 12.9 g/dL (ref 12.0–15.0)

## 2021-10-24 MED ORDER — DARBEPOETIN ALFA 100 MCG/0.5ML IJ SOSY: 100.0000 ug | PREFILLED_SYRINGE | INTRAMUSCULAR | Status: DC

## 2021-11-02 ENCOUNTER — Telehealth: Payer: Self-pay | Admitting: Internal Medicine

## 2021-11-02 NOTE — Telephone Encounter (Signed)
Spoke with the pt  She is asking if MR can prescribe a blood glucose monitor for her  She states she checks her blood sugar often, esp if she is taking her prn pred  MR- are you okay with sending this or would you rather her PCP send? Thanks!

## 2021-11-02 NOTE — Telephone Encounter (Signed)
Lm for patient.  

## 2021-11-03 NOTE — Telephone Encounter (Signed)
PCP Donney Dice, DO - can she ask her PCP  ? I can but things can get confusing once I prescribe it with insurance then sending paper work etc. All to me. LMK what she says

## 2021-11-03 NOTE — Telephone Encounter (Signed)
Called and spoke with patient to let her know that Dr. Chase Caller said he would like for her to try and get the glucose monitor from her PCP for insurance purposes since we don't normally prescribe that stuff. She expressed understanding. Nothing further needed at this time.

## 2021-11-08 ENCOUNTER — Other Ambulatory Visit: Payer: Self-pay | Admitting: Family Medicine

## 2021-11-21 ENCOUNTER — Ambulatory Visit (HOSPITAL_COMMUNITY)
Admission: RE | Admit: 2021-11-21 | Discharge: 2021-11-21 | Disposition: A | Discharge status: Home / Self Care | Payer: Medicare Other | Source: Ambulatory Visit | Attending: Nephrology | Admitting: Nephrology

## 2021-11-21 ENCOUNTER — Other Ambulatory Visit: Payer: Self-pay

## 2021-11-21 VITALS — BP 160/87 | HR 62 | Temp 97.3°F | Resp 18

## 2021-11-21 DIAGNOSIS — N170 Acute kidney failure with tubular necrosis: Secondary | ICD-10-CM | POA: Diagnosis not present

## 2021-11-21 DIAGNOSIS — T451X5A Adverse effect of antineoplastic and immunosuppressive drugs, initial encounter: Secondary | ICD-10-CM | POA: Insufficient documentation

## 2021-11-21 DIAGNOSIS — D631 Anemia in chronic kidney disease: Secondary | ICD-10-CM | POA: Diagnosis not present

## 2021-11-21 DIAGNOSIS — D6481 Anemia due to antineoplastic chemotherapy: Secondary | ICD-10-CM | POA: Insufficient documentation

## 2021-11-21 DIAGNOSIS — N186 End stage renal disease: Secondary | ICD-10-CM | POA: Insufficient documentation

## 2021-11-21 LAB — IRON AND TIBC
Iron: 87 ug/dL (ref 28–170)
Saturation Ratios: 27 % (ref 10.4–31.8)
TIBC: 322 ug/dL (ref 250–450)
UIBC: 235 ug/dL

## 2021-11-21 LAB — POCT HEMOGLOBIN-HEMACUE: Hemoglobin: 12.4 g/dL (ref 12.0–15.0)

## 2021-11-21 LAB — FERRITIN: Ferritin: 180 ng/mL (ref 11–307)

## 2021-11-21 MED ORDER — DARBEPOETIN ALFA 100 MCG/0.5ML IJ SOSY: 100.0000 ug | PREFILLED_SYRINGE | INTRAMUSCULAR | Status: DC

## 2021-11-30 DIAGNOSIS — N184 Chronic kidney disease, stage 4 (severe): Secondary | ICD-10-CM | POA: Diagnosis not present

## 2021-11-30 DIAGNOSIS — E1122 Type 2 diabetes mellitus with diabetic chronic kidney disease: Secondary | ICD-10-CM | POA: Diagnosis not present

## 2021-11-30 DIAGNOSIS — I129 Hypertensive chronic kidney disease with stage 1 through stage 4 chronic kidney disease, or unspecified chronic kidney disease: Secondary | ICD-10-CM | POA: Diagnosis not present

## 2021-11-30 DIAGNOSIS — D649 Anemia, unspecified: Secondary | ICD-10-CM | POA: Diagnosis not present

## 2021-11-30 DIAGNOSIS — M3131 Wegener's granulomatosis with renal involvement: Secondary | ICD-10-CM | POA: Diagnosis not present

## 2021-11-30 DIAGNOSIS — I7782 Antineutrophilic cytoplasmic antibody (ANCA) vasculitis: Secondary | ICD-10-CM | POA: Diagnosis not present

## 2021-12-05 ENCOUNTER — Other Ambulatory Visit: Payer: Self-pay | Admitting: Adult Health

## 2021-12-07 ENCOUNTER — Other Ambulatory Visit: Payer: Self-pay

## 2021-12-19 ENCOUNTER — Ambulatory Visit (HOSPITAL_COMMUNITY)
Admission: RE | Admit: 2021-12-19 | Discharge: 2021-12-19 | Disposition: A | Discharge status: Home / Self Care | Payer: Medicare Other | Source: Ambulatory Visit | Attending: Nephrology | Admitting: Nephrology

## 2021-12-19 DIAGNOSIS — D631 Anemia in chronic kidney disease: Secondary | ICD-10-CM | POA: Diagnosis not present

## 2021-12-19 DIAGNOSIS — N170 Acute kidney failure with tubular necrosis: Secondary | ICD-10-CM | POA: Diagnosis not present

## 2021-12-19 LAB — POCT HEMOGLOBIN-HEMACUE: Hemoglobin: 12.5 g/dL (ref 12.0–15.0)

## 2021-12-19 LAB — FERRITIN: Ferritin: 205 ng/mL (ref 11–307)

## 2021-12-19 LAB — IRON AND TIBC
Iron: 72 ug/dL (ref 28–170)
Saturation Ratios: 20 % (ref 10.4–31.8)
TIBC: 353 ug/dL (ref 250–450)
UIBC: 281 ug/dL

## 2021-12-19 MED ORDER — DARBEPOETIN ALFA 100 MCG/0.5ML IJ SOSY: 100.0000 ug | PREFILLED_SYRINGE | INTRAMUSCULAR | Status: DC

## 2022-01-16 ENCOUNTER — Ambulatory Visit (HOSPITAL_COMMUNITY)
Admission: RE | Admit: 2022-01-16 | Discharge: 2022-01-16 | Disposition: A | Discharge status: Home / Self Care | Payer: Medicare Other | Source: Ambulatory Visit | Attending: Nephrology | Admitting: Nephrology

## 2022-01-16 VITALS — BP 143/90 | HR 57 | Resp 19

## 2022-01-16 DIAGNOSIS — N170 Acute kidney failure with tubular necrosis: Secondary | ICD-10-CM | POA: Diagnosis not present

## 2022-01-16 DIAGNOSIS — D631 Anemia in chronic kidney disease: Secondary | ICD-10-CM | POA: Insufficient documentation

## 2022-01-16 LAB — IRON AND TIBC
Iron: 64 ug/dL (ref 28–170)
Saturation Ratios: 20 % (ref 10.4–31.8)
TIBC: 325 ug/dL (ref 250–450)
UIBC: 261 ug/dL

## 2022-01-16 LAB — FERRITIN: Ferritin: 158 ng/mL (ref 11–307)

## 2022-01-16 LAB — POCT HEMOGLOBIN-HEMACUE: Hemoglobin: 12.3 g/dL (ref 12.0–15.0)

## 2022-01-16 MED ORDER — DARBEPOETIN ALFA 100 MCG/0.5ML IJ SOSY: 100.0000 ug | PREFILLED_SYRINGE | INTRAMUSCULAR | Status: DC

## 2022-02-13 ENCOUNTER — Ambulatory Visit (HOSPITAL_COMMUNITY)
Admission: RE | Admit: 2022-02-13 | Discharge: 2022-02-13 | Disposition: A | Discharge status: Home / Self Care | Payer: Medicare Other | Source: Ambulatory Visit | Attending: Nephrology | Admitting: Nephrology

## 2022-02-13 VITALS — BP 155/76 | HR 58 | Temp 97.1°F | Resp 20

## 2022-02-13 DIAGNOSIS — N170 Acute kidney failure with tubular necrosis: Secondary | ICD-10-CM | POA: Diagnosis not present

## 2022-02-13 DIAGNOSIS — N184 Chronic kidney disease, stage 4 (severe): Secondary | ICD-10-CM | POA: Diagnosis not present

## 2022-02-13 DIAGNOSIS — D631 Anemia in chronic kidney disease: Secondary | ICD-10-CM | POA: Diagnosis not present

## 2022-02-13 LAB — IRON AND TIBC
Iron: 88 ug/dL (ref 28–170)
Saturation Ratios: 27 % (ref 10.4–31.8)
TIBC: 328 ug/dL (ref 250–450)
UIBC: 240 ug/dL

## 2022-02-13 LAB — FERRITIN: Ferritin: 202 ng/mL (ref 11–307)

## 2022-02-13 LAB — POCT HEMOGLOBIN-HEMACUE: Hemoglobin: 12 g/dL (ref 12.0–15.0)

## 2022-02-13 MED ORDER — DARBEPOETIN ALFA 100 MCG/0.5ML IJ SOSY: 100.0000 ug | PREFILLED_SYRINGE | INTRAMUSCULAR | Status: DC

## 2022-03-05 ENCOUNTER — Other Ambulatory Visit: Payer: Self-pay | Admitting: Family Medicine

## 2022-03-14 ENCOUNTER — Ambulatory Visit (HOSPITAL_COMMUNITY)
Admission: RE | Admit: 2022-03-14 | Discharge: 2022-03-14 | Disposition: A | Discharge status: Home / Self Care | Payer: Medicare Other | Source: Ambulatory Visit | Attending: Nephrology | Admitting: Nephrology

## 2022-03-14 VITALS — BP 139/86 | HR 63 | Resp 20

## 2022-03-14 DIAGNOSIS — N184 Chronic kidney disease, stage 4 (severe): Secondary | ICD-10-CM | POA: Diagnosis not present

## 2022-03-14 DIAGNOSIS — D631 Anemia in chronic kidney disease: Secondary | ICD-10-CM | POA: Insufficient documentation

## 2022-03-14 DIAGNOSIS — N170 Acute kidney failure with tubular necrosis: Secondary | ICD-10-CM | POA: Insufficient documentation

## 2022-03-14 LAB — IRON AND TIBC
Iron: 68 ug/dL (ref 28–170)
Saturation Ratios: 21 % (ref 10.4–31.8)
TIBC: 328 ug/dL (ref 250–450)
UIBC: 260 ug/dL

## 2022-03-14 LAB — FERRITIN: Ferritin: 146 ng/mL (ref 11–307)

## 2022-03-14 LAB — POCT HEMOGLOBIN-HEMACUE: Hemoglobin: 11.5 g/dL — ABNORMAL LOW (ref 12.0–15.0)

## 2022-03-14 MED ORDER — DARBEPOETIN ALFA 100 MCG/0.5ML IJ SOSY
PREFILLED_SYRINGE | INTRAMUSCULAR | Status: AC
  Administered 2022-03-14: 100 ug via SUBCUTANEOUS
  Filled 2022-03-14: qty 0.5

## 2022-03-14 MED ORDER — DARBEPOETIN ALFA 100 MCG/0.5ML IJ SOSY: 100.0000 ug | PREFILLED_SYRINGE | INTRAMUSCULAR | Status: DC

## 2022-04-02 DIAGNOSIS — I129 Hypertensive chronic kidney disease with stage 1 through stage 4 chronic kidney disease, or unspecified chronic kidney disease: Secondary | ICD-10-CM | POA: Diagnosis not present

## 2022-04-02 DIAGNOSIS — D631 Anemia in chronic kidney disease: Secondary | ICD-10-CM | POA: Diagnosis not present

## 2022-04-02 DIAGNOSIS — N189 Chronic kidney disease, unspecified: Secondary | ICD-10-CM | POA: Diagnosis not present

## 2022-04-02 DIAGNOSIS — E1122 Type 2 diabetes mellitus with diabetic chronic kidney disease: Secondary | ICD-10-CM | POA: Diagnosis not present

## 2022-04-02 DIAGNOSIS — M3131 Wegener's granulomatosis with renal involvement: Secondary | ICD-10-CM | POA: Diagnosis not present

## 2022-04-02 DIAGNOSIS — N184 Chronic kidney disease, stage 4 (severe): Secondary | ICD-10-CM | POA: Diagnosis not present

## 2022-04-11 ENCOUNTER — Encounter (HOSPITAL_COMMUNITY)
Admission: RE | Admit: 2022-04-11 | Discharge: 2022-04-11 | Disposition: A | Discharge status: Home / Self Care | Payer: Medicare Other | Source: Ambulatory Visit | Attending: Nephrology | Admitting: Nephrology

## 2022-04-11 VITALS — BP 169/78 | HR 62 | Temp 97.7°F | Resp 18

## 2022-04-11 DIAGNOSIS — N170 Acute kidney failure with tubular necrosis: Secondary | ICD-10-CM | POA: Insufficient documentation

## 2022-04-11 DIAGNOSIS — D631 Anemia in chronic kidney disease: Secondary | ICD-10-CM | POA: Insufficient documentation

## 2022-04-11 DIAGNOSIS — N184 Chronic kidney disease, stage 4 (severe): Secondary | ICD-10-CM | POA: Diagnosis not present

## 2022-04-11 LAB — POCT HEMOGLOBIN-HEMACUE: Hemoglobin: 12.7 g/dL (ref 12.0–15.0)

## 2022-04-11 LAB — IRON AND TIBC
Iron: 69 ug/dL (ref 28–170)
Saturation Ratios: 21 % (ref 10.4–31.8)
TIBC: 323 ug/dL (ref 250–450)
UIBC: 254 ug/dL

## 2022-04-11 LAB — FERRITIN: Ferritin: 144 ng/mL (ref 11–307)

## 2022-04-11 MED ORDER — DARBEPOETIN ALFA 100 MCG/0.5ML IJ SOSY: 100.0000 ug | PREFILLED_SYRINGE | INTRAMUSCULAR | Status: DC

## 2022-04-16 DIAGNOSIS — I7782 Antineutrophilic cytoplasmic antibody (ANCA) vasculitis: Secondary | ICD-10-CM | POA: Diagnosis not present

## 2022-05-09 ENCOUNTER — Encounter (HOSPITAL_COMMUNITY)
Admission: RE | Admit: 2022-05-09 | Discharge: 2022-05-09 | Disposition: A | Discharge status: Home / Self Care | Payer: Medicare Other | Source: Ambulatory Visit | Attending: Nephrology | Admitting: Nephrology

## 2022-05-09 VITALS — BP 180/89 | HR 57 | Temp 97.3°F | Resp 14

## 2022-05-09 DIAGNOSIS — N184 Chronic kidney disease, stage 4 (severe): Secondary | ICD-10-CM | POA: Diagnosis not present

## 2022-05-09 DIAGNOSIS — N170 Acute kidney failure with tubular necrosis: Secondary | ICD-10-CM | POA: Diagnosis not present

## 2022-05-09 DIAGNOSIS — D631 Anemia in chronic kidney disease: Secondary | ICD-10-CM | POA: Diagnosis not present

## 2022-05-09 MED ORDER — DARBEPOETIN ALFA 100 MCG/0.5ML IJ SOSY: 100.0000 ug | PREFILLED_SYRINGE | INTRAMUSCULAR | Status: DC

## 2022-05-29 LAB — POCT HEMOGLOBIN-HEMACUE: Hemoglobin: 13.9 g/dL (ref 12.0–15.0)

## 2022-06-05 ENCOUNTER — Other Ambulatory Visit: Payer: Self-pay | Admitting: Family Medicine

## 2022-06-05 ENCOUNTER — Other Ambulatory Visit: Payer: Self-pay | Admitting: Internal Medicine

## 2022-06-06 ENCOUNTER — Ambulatory Visit (HOSPITAL_COMMUNITY)
Admission: RE | Admit: 2022-06-06 | Discharge: 2022-06-06 | Disposition: A | Discharge status: Home / Self Care | Payer: Medicare Other | Source: Ambulatory Visit | Attending: Nephrology | Admitting: Nephrology

## 2022-06-06 VITALS — BP 172/93 | HR 56 | Temp 97.3°F | Resp 15

## 2022-06-06 DIAGNOSIS — N184 Chronic kidney disease, stage 4 (severe): Secondary | ICD-10-CM | POA: Insufficient documentation

## 2022-06-06 DIAGNOSIS — N170 Acute kidney failure with tubular necrosis: Secondary | ICD-10-CM | POA: Diagnosis not present

## 2022-06-06 DIAGNOSIS — D631 Anemia in chronic kidney disease: Secondary | ICD-10-CM | POA: Insufficient documentation

## 2022-06-06 LAB — IRON AND TIBC
Iron: 84 ug/dL (ref 28–170)
Saturation Ratios: 21 % (ref 10.4–31.8)
TIBC: 393 ug/dL (ref 250–450)
UIBC: 309 ug/dL

## 2022-06-06 LAB — FERRITIN: Ferritin: 180 ng/mL (ref 11–307)

## 2022-06-06 LAB — POCT HEMOGLOBIN-HEMACUE: Hemoglobin: 13.5 g/dL (ref 12.0–15.0)

## 2022-06-06 MED ORDER — DARBEPOETIN ALFA 100 MCG/0.5ML IJ SOSY: 100.0000 ug | PREFILLED_SYRINGE | INTRAMUSCULAR | Status: DC

## 2022-07-03 ENCOUNTER — Telehealth: Payer: Self-pay | Admitting: Internal Medicine

## 2022-07-03 NOTE — Telephone Encounter (Signed)
Catherine Aguilar,  Please note patient dropped off paperwork  Thank you

## 2022-07-04 ENCOUNTER — Ambulatory Visit (HOSPITAL_COMMUNITY)
Admission: RE | Admit: 2022-07-04 | Discharge: 2022-07-04 | Disposition: A | Discharge status: Home / Self Care | Payer: Medicare Other | Source: Ambulatory Visit | Attending: Nephrology | Admitting: Nephrology

## 2022-07-04 VITALS — BP 144/90 | HR 60 | Temp 98.3°F | Resp 18

## 2022-07-04 DIAGNOSIS — N189 Chronic kidney disease, unspecified: Secondary | ICD-10-CM | POA: Insufficient documentation

## 2022-07-04 DIAGNOSIS — N170 Acute kidney failure with tubular necrosis: Secondary | ICD-10-CM | POA: Diagnosis not present

## 2022-07-04 DIAGNOSIS — D631 Anemia in chronic kidney disease: Secondary | ICD-10-CM | POA: Diagnosis not present

## 2022-07-04 LAB — IRON AND TIBC
Iron: 60 ug/dL (ref 28–170)
Saturation Ratios: 18 % (ref 10.4–31.8)
TIBC: 330 ug/dL (ref 250–450)
UIBC: 270 ug/dL

## 2022-07-04 LAB — FERRITIN: Ferritin: 161 ng/mL (ref 11–307)

## 2022-07-04 LAB — POCT HEMOGLOBIN-HEMACUE: Hemoglobin: 12.4 g/dL (ref 12.0–15.0)

## 2022-07-04 MED ORDER — DARBEPOETIN ALFA 100 MCG/0.5ML IJ SOSY: 100.0000 ug | PREFILLED_SYRINGE | INTRAMUSCULAR | Status: DC

## 2022-07-05 NOTE — Telephone Encounter (Signed)
Gave paperwork to [place] for her to look at.

## 2022-07-06 ENCOUNTER — Telehealth: Payer: Self-pay | Admitting: Internal Medicine

## 2022-07-06 NOTE — Telephone Encounter (Signed)
I have reviewed the form dropped off by the patient for Dr. Chase Caller - "Request for Independent Assessment for [place]".   This patient has not been seen in this office for over a year (Sept 2022) and there are not records in [place] of any other visits to [facility] providers.   This is not a form that we can fill out for her.   I called and left a voice message for her explaining we cannot complete the form and she needs to give it to a provider who has seen her recently.  I offered to fax it to whichever provider she would like for me to and left my direct call back number.

## 2022-07-09 NOTE — Telephone Encounter (Signed)
Darilyn, please advise if you still have the paperwork that I placed on your desk on pt and if you can advise if there is anything that I need to do with it.

## 2022-08-01 ENCOUNTER — Ambulatory Visit (HOSPITAL_COMMUNITY)
Admission: RE | Admit: 2022-08-01 | Discharge: 2022-08-01 | Disposition: A | Discharge status: Home / Self Care | Payer: Medicare Other | Source: Ambulatory Visit | Attending: Nephrology | Admitting: Nephrology

## 2022-08-01 VITALS — HR 63 | Temp 97.0°F | Resp 18

## 2022-08-01 DIAGNOSIS — N184 Chronic kidney disease, stage 4 (severe): Secondary | ICD-10-CM | POA: Diagnosis not present

## 2022-08-01 DIAGNOSIS — D631 Anemia in chronic kidney disease: Secondary | ICD-10-CM | POA: Diagnosis not present

## 2022-08-01 DIAGNOSIS — N170 Acute kidney failure with tubular necrosis: Secondary | ICD-10-CM | POA: Insufficient documentation

## 2022-08-01 LAB — IRON AND TIBC
Iron: 69 ug/dL (ref 28–170)
Saturation Ratios: 18 % (ref 10.4–31.8)
TIBC: 379 ug/dL (ref 250–450)
UIBC: 310 ug/dL

## 2022-08-01 LAB — FERRITIN: Ferritin: 173 ng/mL (ref 11–307)

## 2022-08-01 LAB — POCT HEMOGLOBIN-HEMACUE: Hemoglobin: 12.3 g/dL (ref 12.0–15.0)

## 2022-08-01 MED ORDER — DARBEPOETIN ALFA 100 MCG/0.5ML IJ SOSY: 100.0000 ug | PREFILLED_SYRINGE | INTRAMUSCULAR | Status: DC

## 2022-08-05 ENCOUNTER — Other Ambulatory Visit: Payer: Self-pay | Admitting: Family Medicine

## 2022-08-08 DIAGNOSIS — I129 Hypertensive chronic kidney disease with stage 1 through stage 4 chronic kidney disease, or unspecified chronic kidney disease: Secondary | ICD-10-CM | POA: Diagnosis not present

## 2022-08-08 DIAGNOSIS — N184 Chronic kidney disease, stage 4 (severe): Secondary | ICD-10-CM | POA: Diagnosis not present

## 2022-08-08 DIAGNOSIS — I7782 Antineutrophilic cytoplasmic antibody (ANCA) vasculitis: Secondary | ICD-10-CM | POA: Diagnosis not present

## 2022-08-08 DIAGNOSIS — Z23 Encounter for immunization: Secondary | ICD-10-CM | POA: Diagnosis not present

## 2022-08-22 DIAGNOSIS — N184 Chronic kidney disease, stage 4 (severe): Secondary | ICD-10-CM | POA: Diagnosis not present

## 2022-08-29 ENCOUNTER — Ambulatory Visit (HOSPITAL_COMMUNITY)
Admission: RE | Admit: 2022-08-29 | Discharge: 2022-08-29 | Disposition: A | Discharge status: Home / Self Care | Payer: Medicare Other | Source: Ambulatory Visit | Attending: Nephrology | Admitting: Nephrology

## 2022-08-29 VITALS — BP 166/84 | HR 61 | Temp 97.2°F

## 2022-08-29 DIAGNOSIS — N170 Acute kidney failure with tubular necrosis: Secondary | ICD-10-CM | POA: Insufficient documentation

## 2022-08-29 DIAGNOSIS — D631 Anemia in chronic kidney disease: Secondary | ICD-10-CM | POA: Diagnosis not present

## 2022-08-29 DIAGNOSIS — N184 Chronic kidney disease, stage 4 (severe): Secondary | ICD-10-CM | POA: Diagnosis not present

## 2022-08-29 LAB — IRON AND TIBC
Iron: 71 ug/dL (ref 28–170)
Saturation Ratios: 19 % (ref 10.4–31.8)
TIBC: 365 ug/dL (ref 250–450)
UIBC: 294 ug/dL

## 2022-08-29 LAB — FERRITIN: Ferritin: 188 ng/mL (ref 11–307)

## 2022-08-29 LAB — POCT HEMOGLOBIN-HEMACUE: Hemoglobin: 11.4 g/dL — ABNORMAL LOW (ref 12.0–15.0)

## 2022-08-29 MED ORDER — DARBEPOETIN ALFA 100 MCG/0.5ML IJ SOSY
PREFILLED_SYRINGE | INTRAMUSCULAR | Status: AC
  Filled 2022-08-29: qty 0.5

## 2022-08-29 MED ORDER — DARBEPOETIN ALFA 100 MCG/0.5ML IJ SOSY
100.0000 ug | PREFILLED_SYRINGE | INTRAMUSCULAR | Status: DC
  Administered 2022-08-29: 100 ug via SUBCUTANEOUS

## 2022-09-19 ENCOUNTER — Encounter (HOSPITAL_COMMUNITY): Payer: Self-pay | Admitting: *Deleted

## 2022-09-24 DIAGNOSIS — I7782 Antineutrophilic cytoplasmic antibody (ANCA) vasculitis: Secondary | ICD-10-CM | POA: Diagnosis not present

## 2022-09-26 ENCOUNTER — Ambulatory Visit (HOSPITAL_COMMUNITY)
Admission: RE | Admit: 2022-09-26 | Discharge: 2022-09-26 | Disposition: A | Discharge status: Home / Self Care | Payer: 59 | Source: Ambulatory Visit | Attending: Nephrology | Admitting: Nephrology

## 2022-09-26 VITALS — BP 163/85 | HR 61

## 2022-09-26 DIAGNOSIS — N170 Acute kidney failure with tubular necrosis: Secondary | ICD-10-CM | POA: Insufficient documentation

## 2022-09-26 DIAGNOSIS — D631 Anemia in chronic kidney disease: Secondary | ICD-10-CM | POA: Diagnosis not present

## 2022-09-26 DIAGNOSIS — N184 Chronic kidney disease, stage 4 (severe): Secondary | ICD-10-CM | POA: Diagnosis not present

## 2022-09-26 LAB — POCT HEMOGLOBIN-HEMACUE: Hemoglobin: 12 g/dL (ref 12.0–15.0)

## 2022-09-26 LAB — IRON AND TIBC
Iron: 61 ug/dL (ref 28–170)
Saturation Ratios: 18 % (ref 10.4–31.8)
TIBC: 339 ug/dL (ref 250–450)
UIBC: 278 ug/dL

## 2022-09-26 LAB — FERRITIN: Ferritin: 131 ng/mL (ref 11–307)

## 2022-09-26 MED ORDER — DARBEPOETIN ALFA 100 MCG/0.5ML IJ SOSY: 100.0000 ug | PREFILLED_SYRINGE | INTRAMUSCULAR | Status: DC

## 2022-10-16 IMAGING — MG MM DIGITAL SCREENING BILAT W/ TOMO AND CAD
8 series · 8 of 24 positions shown · non-contrast
Comparison: Previous exam(s).

CLINICAL DATA: Screening.

EXAM:
DIGITAL SCREENING BILATERAL MAMMOGRAM WITH TOMOSYNTHESIS AND CAD
TECHNIQUE: Bilateral screening digital craniocaudal and mediolateral oblique
mammograms were obtained. Bilateral screening digital breast
tomosynthesis was performed. The images were evaluated with
computer-aided detection.

[L CC synth-2D]
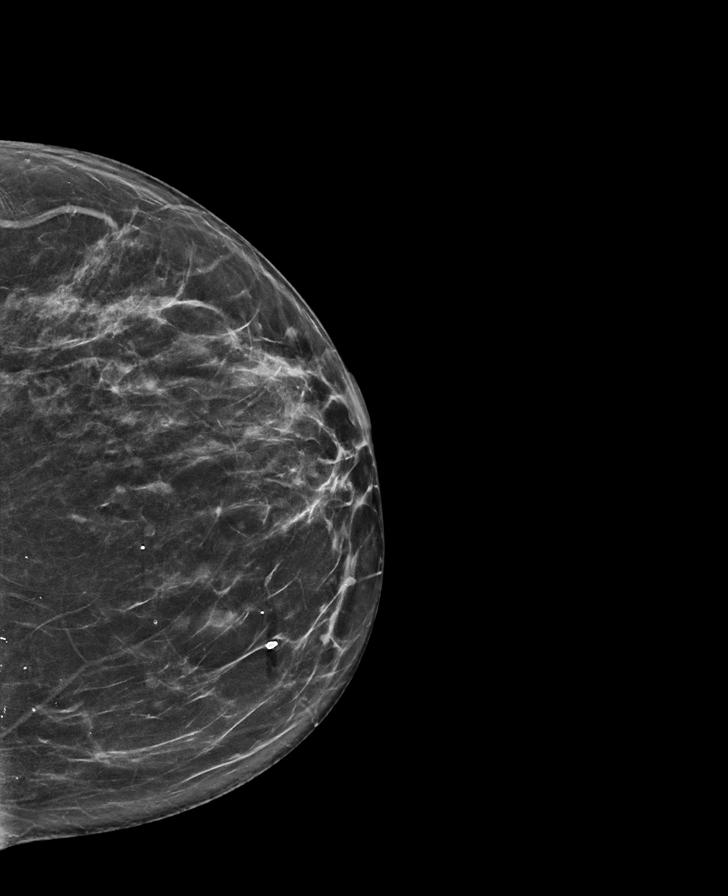

[R CC synth-2D]
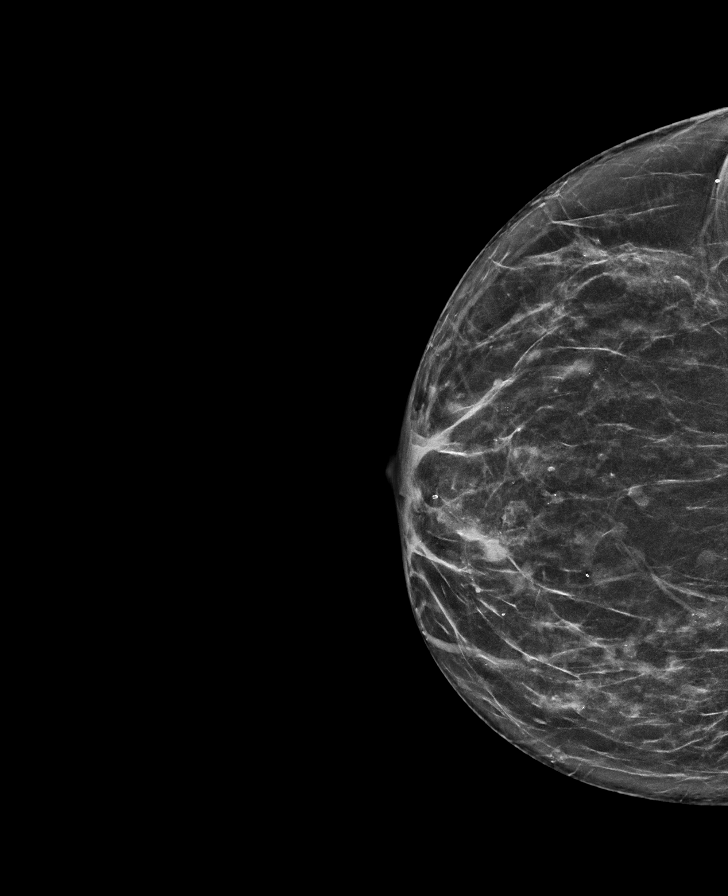

[R MLO synth-2D]
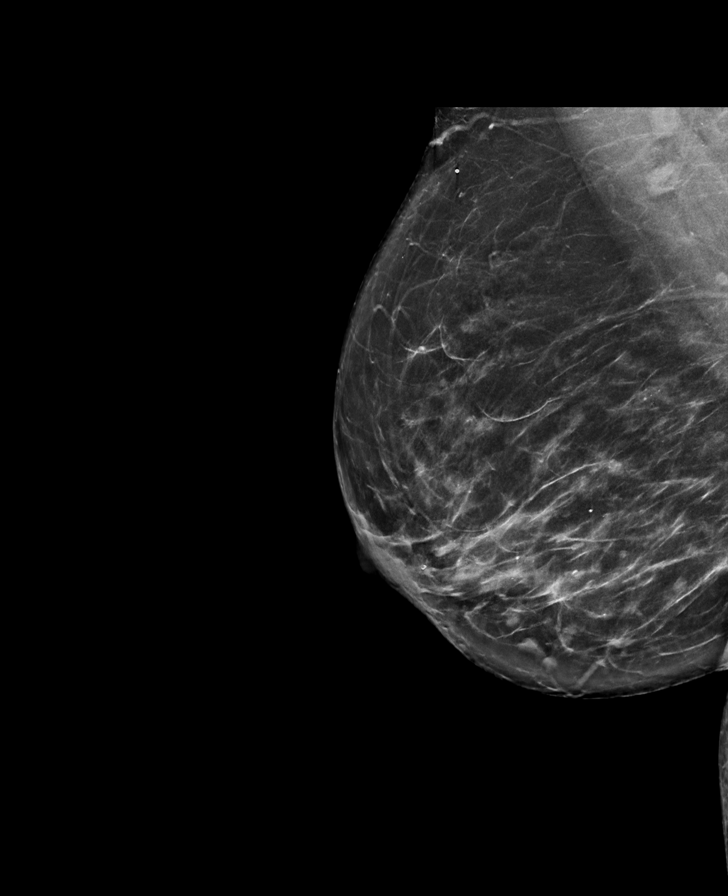

[L MLO synth-2D]
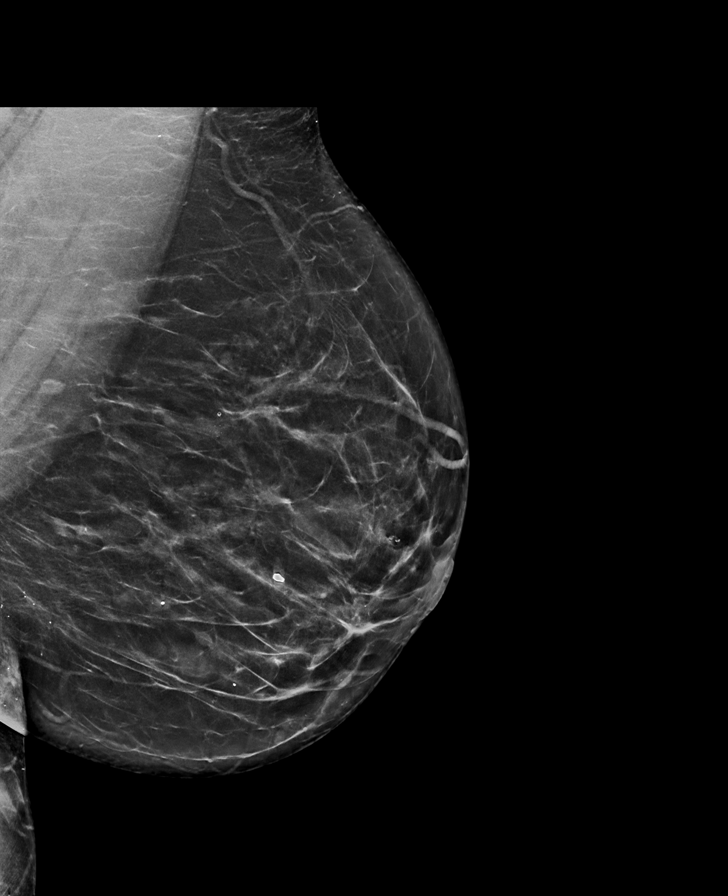

[L CC tomo · tomo slice 36/71.0]
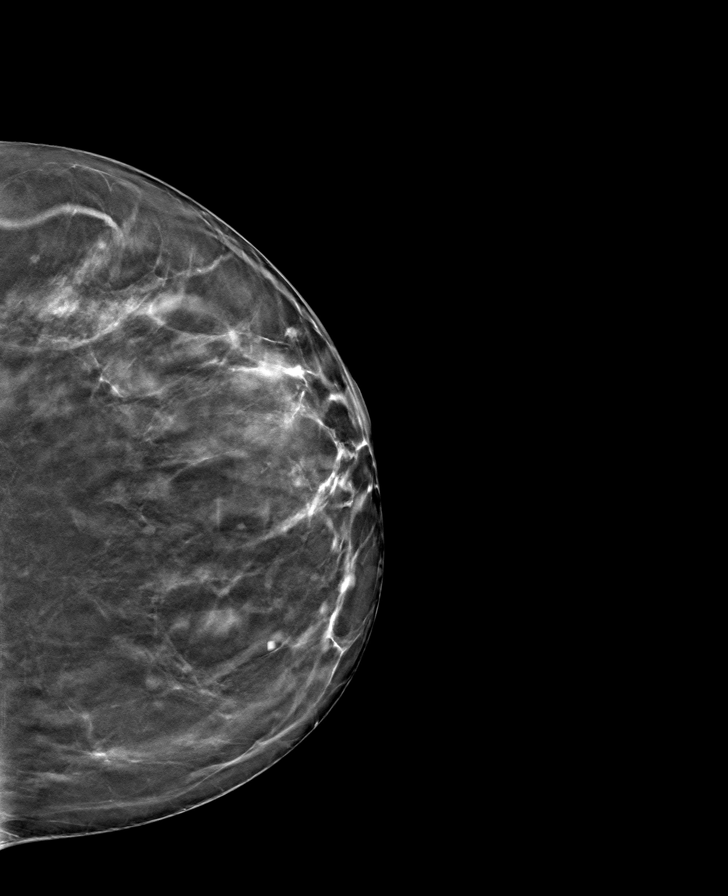

[L MLO tomo · tomo slice 43/84.0]
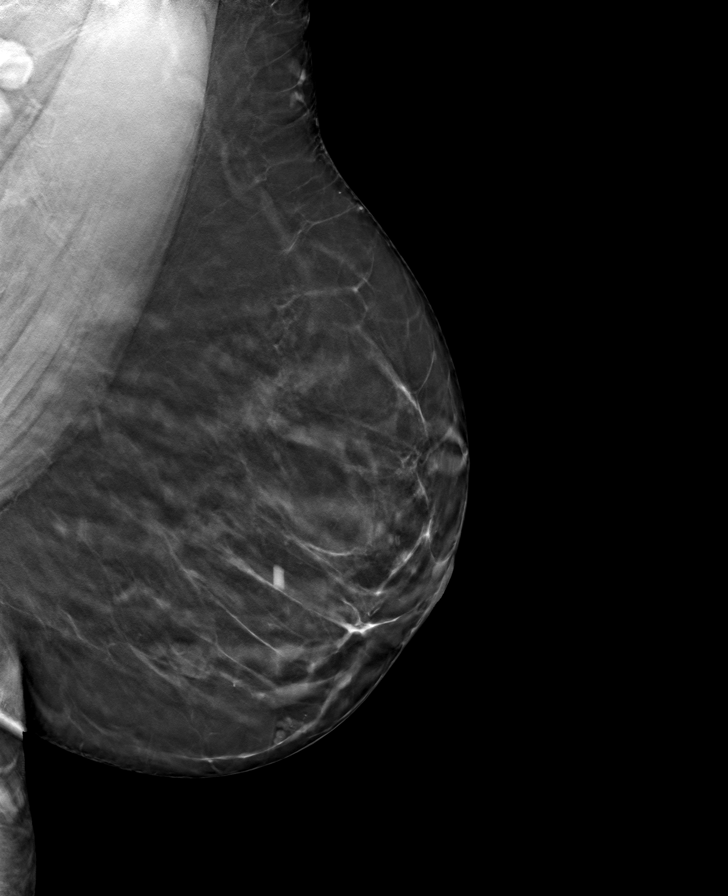

[R MLO tomo · tomo slice 40/79.0]
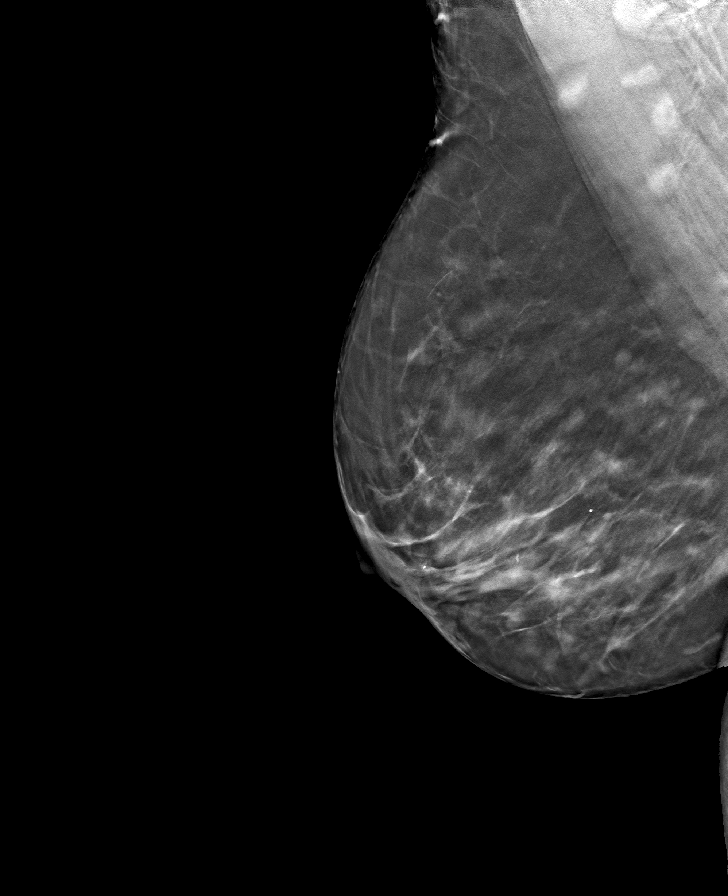

[R CC tomo · tomo slice 33/64.0]
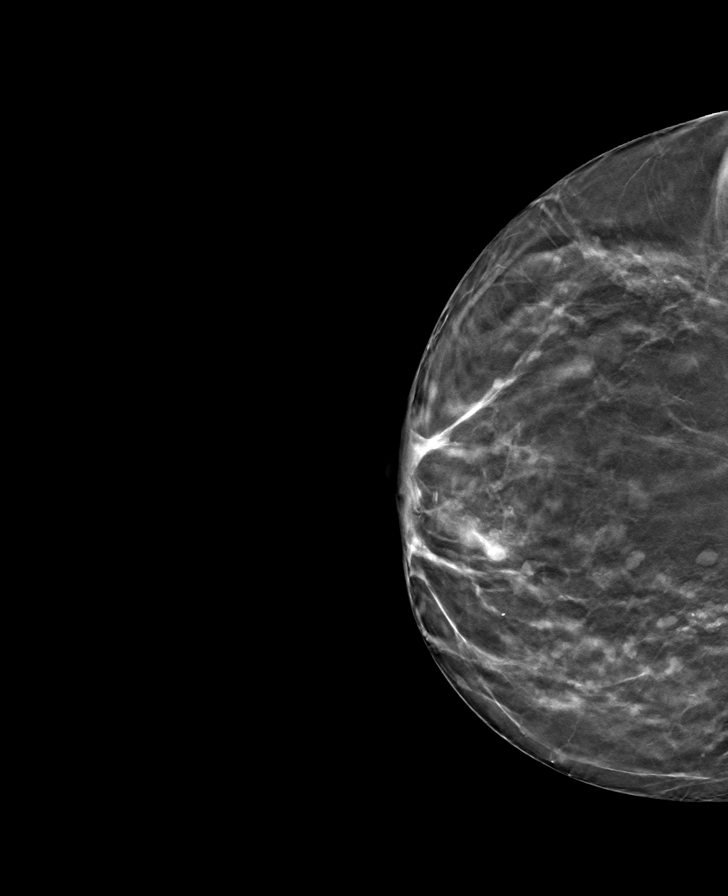

[8 of 24 positions shown; findings below may reference images not displayed]

ACR Breast Density Category b: There are scattered areas of
fibroglandular density.
FINDINGS: There are no findings suspicious for malignancy. The images were
evaluated with computer-aided detection.
IMPRESSION: No mammographic evidence of malignancy. A result letter of this
screening mammogram will be mailed directly to the patient.

RECOMMENDATION:
Screening mammogram in one year. (Code:[id number])

BI-RADS CATEGORY  1: Negative.

## 2022-10-18 DIAGNOSIS — M3130 Wegener's granulomatosis without renal involvement: Secondary | ICD-10-CM | POA: Diagnosis not present

## 2022-10-24 ENCOUNTER — Ambulatory Visit (HOSPITAL_COMMUNITY)
Admission: RE | Admit: 2022-10-24 | Discharge: 2022-10-24 | Disposition: A | Discharge status: Home / Self Care | Payer: 59 | Source: Ambulatory Visit | Attending: Nephrology | Admitting: Nephrology

## 2022-10-24 VITALS — BP 151/75 | HR 52 | Temp 98.1°F | Resp 20

## 2022-10-24 DIAGNOSIS — N170 Acute kidney failure with tubular necrosis: Secondary | ICD-10-CM | POA: Insufficient documentation

## 2022-10-24 DIAGNOSIS — N184 Chronic kidney disease, stage 4 (severe): Secondary | ICD-10-CM | POA: Diagnosis not present

## 2022-10-24 DIAGNOSIS — D631 Anemia in chronic kidney disease: Secondary | ICD-10-CM | POA: Diagnosis not present

## 2022-10-24 LAB — IRON AND TIBC
Iron: 68 ug/dL (ref 28–170)
Saturation Ratios: 19 % (ref 10.4–31.8)
TIBC: 350 ug/dL (ref 250–450)
UIBC: 282 ug/dL

## 2022-10-24 LAB — POCT HEMOGLOBIN-HEMACUE: Hemoglobin: 11.3 g/dL — ABNORMAL LOW (ref 12.0–15.0)

## 2022-10-24 LAB — FERRITIN: Ferritin: 173 ng/mL (ref 11–307)

## 2022-10-24 MED ORDER — DARBEPOETIN ALFA 100 MCG/0.5ML IJ SOSY: 100.0000 ug | PREFILLED_SYRINGE | INTRAMUSCULAR | Status: DC

## 2022-10-24 MED ORDER — DARBEPOETIN ALFA 100 MCG/0.5ML IJ SOSY
PREFILLED_SYRINGE | INTRAMUSCULAR | Status: AC
  Administered 2022-10-24: 100 ug via SUBCUTANEOUS
  Filled 2022-10-24: qty 0.5

## 2022-11-07 ENCOUNTER — Telehealth: Payer: Self-pay | Admitting: Family Medicine

## 2022-11-07 NOTE — Telephone Encounter (Signed)
Contacted Kya Kondracki to schedule their annual wellness visit. Appointment made for 11/23/2022.  Thank you,  [place] Direct dial  [phone]

## 2022-11-21 ENCOUNTER — Encounter (HOSPITAL_COMMUNITY): Payer: Self-pay | Admitting: *Deleted

## 2022-11-21 ENCOUNTER — Ambulatory Visit (HOSPITAL_COMMUNITY)
Admission: RE | Admit: 2022-11-21 | Discharge: 2022-11-21 | Disposition: A | Discharge status: Home / Self Care | Payer: 59 | Source: Ambulatory Visit | Attending: Nephrology | Admitting: Nephrology

## 2022-11-21 VITALS — BP 175/90 | HR 58 | Resp 19

## 2022-11-21 DIAGNOSIS — N184 Chronic kidney disease, stage 4 (severe): Secondary | ICD-10-CM | POA: Diagnosis not present

## 2022-11-21 DIAGNOSIS — N170 Acute kidney failure with tubular necrosis: Secondary | ICD-10-CM | POA: Insufficient documentation

## 2022-11-21 DIAGNOSIS — D631 Anemia in chronic kidney disease: Secondary | ICD-10-CM | POA: Diagnosis not present

## 2022-11-21 LAB — POCT HEMOGLOBIN-HEMACUE: Hemoglobin: 11.8 g/dL — ABNORMAL LOW (ref 12.0–15.0)

## 2022-11-21 LAB — IRON AND TIBC
Iron: 69 ug/dL (ref 28–170)
Saturation Ratios: 20 % (ref 10.4–31.8)
TIBC: 342 ug/dL (ref 250–450)
UIBC: 273 ug/dL

## 2022-11-21 LAB — FERRITIN: Ferritin: 136 ng/mL (ref 11–307)

## 2022-11-21 MED ORDER — DARBEPOETIN ALFA 100 MCG/0.5ML IJ SOSY
100.0000 ug | PREFILLED_SYRINGE | INTRAMUSCULAR | Status: DC
  Administered 2022-11-21: 100 ug via SUBCUTANEOUS

## 2022-11-21 MED ORDER — DARBEPOETIN ALFA 100 MCG/0.5ML IJ SOSY
PREFILLED_SYRINGE | INTRAMUSCULAR | Status: AC
  Filled 2022-11-21: qty 0.5

## 2022-11-22 DIAGNOSIS — I7782 Antineutrophilic cytoplasmic antibody (ANCA) vasculitis: Secondary | ICD-10-CM | POA: Diagnosis not present

## 2022-11-22 DIAGNOSIS — N184 Chronic kidney disease, stage 4 (severe): Secondary | ICD-10-CM | POA: Diagnosis not present

## 2022-11-22 DIAGNOSIS — I129 Hypertensive chronic kidney disease with stage 1 through stage 4 chronic kidney disease, or unspecified chronic kidney disease: Secondary | ICD-10-CM | POA: Diagnosis not present

## 2022-11-22 DIAGNOSIS — M3131 Wegener's granulomatosis with renal involvement: Secondary | ICD-10-CM | POA: Diagnosis not present

## 2022-11-22 DIAGNOSIS — E785 Hyperlipidemia, unspecified: Secondary | ICD-10-CM | POA: Diagnosis not present

## 2022-11-22 NOTE — Progress Notes (Signed)
   I attempted to contact the patient concerning her scheduled [facility] telephone appt , no answer. The call was forwarded straight to voicemail. I left a detail message on the patients personal voicemail.

## 2022-11-22 NOTE — Patient Instructions (Signed)

## 2022-11-23 ENCOUNTER — Ambulatory Visit (INDEPENDENT_AMBULATORY_CARE_PROVIDER_SITE_OTHER): Payer: 59

## 2022-11-23 DIAGNOSIS — Z0000 Encounter for general adult medical examination without abnormal findings: Secondary | ICD-10-CM

## 2022-11-27 ENCOUNTER — Other Ambulatory Visit: Payer: Self-pay | Admitting: Nephrology

## 2022-11-27 DIAGNOSIS — N184 Chronic kidney disease, stage 4 (severe): Secondary | ICD-10-CM

## 2022-12-08 ENCOUNTER — Other Ambulatory Visit: Payer: Self-pay | Admitting: Internal Medicine

## 2022-12-18 ENCOUNTER — Ambulatory Visit
Admission: RE | Admit: 2022-12-18 | Discharge: 2022-12-18 | Disposition: A | Discharge status: Home / Self Care | Payer: 59 | Source: Ambulatory Visit | Attending: Nephrology | Admitting: Nephrology

## 2022-12-18 DIAGNOSIS — N189 Chronic kidney disease, unspecified: Secondary | ICD-10-CM | POA: Diagnosis not present

## 2022-12-18 DIAGNOSIS — N184 Chronic kidney disease, stage 4 (severe): Secondary | ICD-10-CM

## 2022-12-18 DIAGNOSIS — N281 Cyst of kidney, acquired: Secondary | ICD-10-CM | POA: Diagnosis not present

## 2022-12-19 ENCOUNTER — Ambulatory Visit (HOSPITAL_COMMUNITY)
Admission: RE | Admit: 2022-12-19 | Discharge: 2022-12-19 | Disposition: A | Discharge status: Home / Self Care | Payer: 59 | Source: Ambulatory Visit | Attending: Nephrology | Admitting: Nephrology

## 2022-12-19 VITALS — BP 169/95 | HR 51 | Temp 97.5°F | Resp 18

## 2022-12-19 DIAGNOSIS — D631 Anemia in chronic kidney disease: Secondary | ICD-10-CM | POA: Diagnosis not present

## 2022-12-19 DIAGNOSIS — N170 Acute kidney failure with tubular necrosis: Secondary | ICD-10-CM | POA: Diagnosis not present

## 2022-12-19 DIAGNOSIS — N184 Chronic kidney disease, stage 4 (severe): Secondary | ICD-10-CM | POA: Diagnosis not present

## 2022-12-19 LAB — FERRITIN: Ferritin: 138 ng/mL (ref 11–307)

## 2022-12-19 LAB — IRON AND TIBC
Iron: 64 ug/dL (ref 28–170)
Saturation Ratios: 17 % (ref 10.4–31.8)
TIBC: 368 ug/dL (ref 250–450)
UIBC: 304 ug/dL

## 2022-12-19 LAB — POCT HEMOGLOBIN-HEMACUE: Hemoglobin: 11.3 g/dL — ABNORMAL LOW (ref 12.0–15.0)

## 2022-12-19 MED ORDER — DARBEPOETIN ALFA 100 MCG/0.5ML IJ SOSY
100.0000 ug | PREFILLED_SYRINGE | INTRAMUSCULAR | Status: DC
  Administered 2022-12-19: 100 ug via SUBCUTANEOUS

## 2022-12-19 MED ORDER — DARBEPOETIN ALFA 100 MCG/0.5ML IJ SOSY
PREFILLED_SYRINGE | INTRAMUSCULAR | Status: AC
  Filled 2022-12-19: qty 0.5

## 2023-01-16 ENCOUNTER — Ambulatory Visit (HOSPITAL_COMMUNITY)
Admission: RE | Admit: 2023-01-16 | Discharge: 2023-01-16 | Disposition: A | Discharge status: Home / Self Care | Payer: 59 | Source: Ambulatory Visit | Attending: Nephrology | Admitting: Nephrology

## 2023-01-16 VITALS — BP 179/94 | HR 58 | Temp 98.5°F | Resp 16

## 2023-01-16 DIAGNOSIS — N170 Acute kidney failure with tubular necrosis: Secondary | ICD-10-CM | POA: Insufficient documentation

## 2023-01-16 DIAGNOSIS — D631 Anemia in chronic kidney disease: Secondary | ICD-10-CM | POA: Insufficient documentation

## 2023-01-16 DIAGNOSIS — N184 Chronic kidney disease, stage 4 (severe): Secondary | ICD-10-CM | POA: Insufficient documentation

## 2023-01-16 LAB — POCT HEMOGLOBIN-HEMACUE: Hemoglobin: 11.2 g/dL — ABNORMAL LOW (ref 12.0–15.0)

## 2023-01-16 LAB — IRON AND TIBC
Iron: 64 ug/dL (ref 28–170)
Saturation Ratios: 18 % (ref 10.4–31.8)
TIBC: 351 ug/dL (ref 250–450)
UIBC: 287 ug/dL

## 2023-01-16 LAB — FERRITIN: Ferritin: 133 ng/mL (ref 11–307)

## 2023-01-16 MED ORDER — DARBEPOETIN ALFA 100 MCG/0.5ML IJ SOSY
100.0000 ug | PREFILLED_SYRINGE | INTRAMUSCULAR | Status: DC
  Administered 2023-01-16: 100 ug via SUBCUTANEOUS

## 2023-01-16 MED ORDER — DARBEPOETIN ALFA 100 MCG/0.5ML IJ SOSY
PREFILLED_SYRINGE | INTRAMUSCULAR | Status: AC
  Filled 2023-01-16: qty 0.5

## 2023-02-13 ENCOUNTER — Ambulatory Visit (HOSPITAL_COMMUNITY)
Admission: RE | Admit: 2023-02-13 | Discharge: 2023-02-13 | Disposition: A | Discharge status: Home / Self Care | Payer: 59 | Source: Ambulatory Visit | Attending: Nephrology | Admitting: Nephrology

## 2023-02-13 VITALS — BP 174/80 | HR 60 | Temp 97.7°F | Resp 18

## 2023-02-13 DIAGNOSIS — D631 Anemia in chronic kidney disease: Secondary | ICD-10-CM | POA: Insufficient documentation

## 2023-02-13 DIAGNOSIS — N170 Acute kidney failure with tubular necrosis: Secondary | ICD-10-CM | POA: Insufficient documentation

## 2023-02-13 LAB — FERRITIN: Ferritin: 139 ng/mL (ref 11–307)

## 2023-02-13 LAB — IRON AND TIBC
Iron: 53 ug/dL (ref 28–170)
Saturation Ratios: 15 % (ref 10.4–31.8)
TIBC: 344 ug/dL (ref 250–450)
UIBC: 291 ug/dL

## 2023-02-13 LAB — POCT HEMOGLOBIN-HEMACUE: Hemoglobin: 11.2 g/dL — ABNORMAL LOW (ref 12.0–15.0)

## 2023-02-13 MED ORDER — DARBEPOETIN ALFA 100 MCG/0.5ML IJ SOSY
100.0000 ug | PREFILLED_SYRINGE | INTRAMUSCULAR | Status: DC
  Administered 2023-02-13: 100 ug via SUBCUTANEOUS

## 2023-02-13 MED ORDER — DARBEPOETIN ALFA 100 MCG/0.5ML IJ SOSY
PREFILLED_SYRINGE | INTRAMUSCULAR | Status: AC
  Filled 2023-02-13: qty 0.5

## 2023-03-13 ENCOUNTER — Encounter (HOSPITAL_COMMUNITY)
Admission: RE | Admit: 2023-03-13 | Discharge: 2023-03-13 | Disposition: A | Discharge status: Home / Self Care | Payer: 59 | Source: Ambulatory Visit | Attending: Nephrology | Admitting: Nephrology

## 2023-03-13 VITALS — BP 174/75 | HR 56 | Temp 97.5°F | Resp 17

## 2023-03-13 DIAGNOSIS — D631 Anemia in chronic kidney disease: Secondary | ICD-10-CM | POA: Insufficient documentation

## 2023-03-13 DIAGNOSIS — N184 Chronic kidney disease, stage 4 (severe): Secondary | ICD-10-CM | POA: Insufficient documentation

## 2023-03-13 DIAGNOSIS — N170 Acute kidney failure with tubular necrosis: Secondary | ICD-10-CM | POA: Diagnosis not present

## 2023-03-13 LAB — IRON AND TIBC
Iron: 64 ug/dL (ref 28–170)
Saturation Ratios: 19 % (ref 10.4–31.8)
TIBC: 337 ug/dL (ref 250–450)
UIBC: 273 ug/dL

## 2023-03-13 LAB — POCT HEMOGLOBIN-HEMACUE: Hemoglobin: 11.8 g/dL — ABNORMAL LOW (ref 12.0–15.0)

## 2023-03-13 LAB — FERRITIN: Ferritin: 132 ng/mL (ref 11–307)

## 2023-03-13 MED ORDER — DARBEPOETIN ALFA 100 MCG/0.5ML IJ SOSY
100.0000 ug | PREFILLED_SYRINGE | INTRAMUSCULAR | Status: DC
  Administered 2023-03-13: 100 ug via SUBCUTANEOUS

## 2023-03-13 MED ORDER — DARBEPOETIN ALFA 100 MCG/0.5ML IJ SOSY
PREFILLED_SYRINGE | INTRAMUSCULAR | Status: AC
  Filled 2023-03-13: qty 0.5

## 2023-03-28 DIAGNOSIS — M3131 Wegener's granulomatosis with renal involvement: Secondary | ICD-10-CM | POA: Diagnosis not present

## 2023-03-28 DIAGNOSIS — I129 Hypertensive chronic kidney disease with stage 1 through stage 4 chronic kidney disease, or unspecified chronic kidney disease: Secondary | ICD-10-CM | POA: Diagnosis not present

## 2023-03-28 DIAGNOSIS — N184 Chronic kidney disease, stage 4 (severe): Secondary | ICD-10-CM | POA: Diagnosis not present

## 2023-03-28 DIAGNOSIS — E785 Hyperlipidemia, unspecified: Secondary | ICD-10-CM | POA: Diagnosis not present

## 2023-03-28 DIAGNOSIS — I7782 Antineutrophilic cytoplasmic antibody (ANCA) vasculitis: Secondary | ICD-10-CM | POA: Diagnosis not present

## 2023-03-28 DIAGNOSIS — I639 Cerebral infarction, unspecified: Secondary | ICD-10-CM | POA: Diagnosis not present

## 2023-04-08 ENCOUNTER — Other Ambulatory Visit: Payer: Self-pay

## 2023-04-08 ENCOUNTER — Encounter: Payer: Self-pay | Admitting: Student

## 2023-04-09 ENCOUNTER — Telehealth: Payer: Self-pay | Admitting: Student

## 2023-04-09 MED ORDER — LANTUS SOLOSTAR 100 UNIT/ML ~~LOC~~ SOPN: 5.0000 [IU] | PEN_INJECTOR | Freq: Every day | SUBCUTANEOUS | 0 refills | Status: DC

## 2023-04-09 NOTE — Telephone Encounter (Signed)
Called patient and discussed that insulin refill got sent to [place]. Asked if she still follows with our clinic. She states she does and needs a refill of 5 units of lantus daily. Has not been seen since 06/26/2021. I discussed she will need a visit in person for diabetes follow up.

## 2023-04-10 ENCOUNTER — Encounter (HOSPITAL_COMMUNITY)
Admission: RE | Admit: 2023-04-10 | Discharge: 2023-04-10 | Disposition: A | Discharge status: Home / Self Care | Payer: 59 | Source: Ambulatory Visit | Attending: Nephrology | Admitting: Nephrology

## 2023-04-10 VITALS — BP 180/81 | HR 56 | Temp 97.4°F | Resp 18

## 2023-04-10 DIAGNOSIS — D631 Anemia in chronic kidney disease: Secondary | ICD-10-CM | POA: Diagnosis not present

## 2023-04-10 DIAGNOSIS — N170 Acute kidney failure with tubular necrosis: Secondary | ICD-10-CM | POA: Diagnosis not present

## 2023-04-10 DIAGNOSIS — N184 Chronic kidney disease, stage 4 (severe): Secondary | ICD-10-CM | POA: Diagnosis not present

## 2023-04-10 LAB — POCT HEMOGLOBIN-HEMACUE: Hemoglobin: 11.4 g/dL — ABNORMAL LOW (ref 12.0–15.0)

## 2023-04-10 MED ORDER — DARBEPOETIN ALFA 100 MCG/0.5ML IJ SOSY
100.0000 ug | PREFILLED_SYRINGE | INTRAMUSCULAR | Status: DC
  Administered 2023-04-10: 100 ug via SUBCUTANEOUS

## 2023-04-10 MED ORDER — DARBEPOETIN ALFA 100 MCG/0.5ML IJ SOSY
PREFILLED_SYRINGE | INTRAMUSCULAR | Status: AC
  Filled 2023-04-10: qty 0.5

## 2023-04-11 NOTE — Telephone Encounter (Signed)
Called the patient but I ended up getting her voicemail. I left her a voicemail to give [place] a call back so we can schedule her for a diabetes f/u.

## 2023-04-15 NOTE — Progress Notes (Unsigned)
SUBJECTIVE:   CHIEF COMPLAINT / HPI: Meet PCP/Diabetes Follow Up  Wegener's granulomatosis  ANCA systemic vasculitis  CKD 4 States she is on bactrim MWF for prophylaxis. Denies any current steroid/prednisone use. States last time she used was a couple years ago?  Follows with [facility] very closely with Dr. Signe Colt. Not on dialysis.  Diabetic Follow Up: Patient is a 67 y.o. female who present today for diabetic follow up. Unsure of any lows since she does not take any blood sugars at home.  Patient endorses no problems  Home medications include: lantus 5 units daily ACEi/ARB: no Statin: yes pravastatin 40 mg daily Patient endorses taking these medications as prescribed.  Most recent A1Cs:  Lab Results  Component Value Date   HGBA1C 6.3 04/18/2023   HGBA1C 6.8 06/26/2021   HGBA1C 6.3 05/30/2020   Last Microalbumin, LDL, Creatinine: Lab Results  Component Value Date   MICROALBUR 150 03/25/2017   LDLCALC 85 05/30/2020   CREATININE 3.50 (H) 06/01/2021   Patient does not check blood glucose on a regular basis.  She needs a refill of her blood meter kit/supplies  HTN States that she takes 2 blood pressure medications and that 1 starts with an "A" and another one she takes three times a day. States she does not take hydralazine.  COPD/Asthma Quit smoking 6 years ago. Was previously smoking sine 67 years old, 1 pack a week  Multiple care gaps Due for colonoscopy given last one in 2018 showed for 2 to 7 mm polyps in the rectum and transverse colon.  Surgical pathology showed tubular adenoma and hyperplastic polyps. (Precancerous and 1 adenoma) She states that she has baseline constipation since she was very young and she takes Linzess for this.  She needs a refill for this today.    PERTINENT  PMH / PSH: Wegener's granulomatosis with renal failure, systemic vasculitis, asthma with COPD, hx CVA  OBJECTIVE:   BP 136/66   Pulse 60   Ht 5\' 7"  (1.702 m)   Wt 215 lb 12.8  oz (97.9 kg)   SpO2 95%   BMI 33.80 kg/m   General: Well appearing, NAD, awake, alert, responsive to questions Head: Normocephalic atraumatic CV: Regular rate and rhythm no murmurs rubs or gallops Respiratory: Clear to ausculation bilaterally, no wheezes rales or crackles, chest rises symmetrically,  no increased work of breathing Abdomen: Soft, non-tender, non-distended, normoactive bowel sounds  Extremities: Moves upper and lower extremities freely, no edema in LE Neuro: No focal deficits Skin: No rashes or lesions visualized   ASSESSMENT/PLAN:   Wegener's granulomatosis with renal involvement (HCC) She follows closely with [facility].  Not currently on steroids, only on Bactrim prophylaxis. -BMP -CBC   Essential hypertension Patient's blood pressure somewhat controlled today. BP: 136/66. Goal of 130/80 given CKD. Patient's medication regimen includes 2 antihypertensives (she is unsure what they are called). -Patient to call clinic to let us know which 2 antihypertensives she is taking so we can adjust in future if needed -Labs: BMP -Follow up in 4 weeks   Type 2 diabetes mellitus without complication, with long-term current use of insulin (HCC) A1c today 6.3.  Home regimen of Lantus 5 units daily. -Continue Lantus 5 units daily -Refilled blood glucose meter kit and device -Patient to check blood sugars and let me know at next appointment if having lows -CBC, BMP, future lipid panel  Asthma with COPD (chronic obstructive pulmonary disease) (HCC) Stable currently  -Refill albuterol -Continue maintenance inhaler Spiriva  Health care maintenance Has many care gaps that need to be filled.  Will start with colonoscopy referral given anemia and chronic constipation and history of polyps 1 of which was precancerous. -GI referral for colonoscopy -Return in 1 month for continued care (would prioritize DEXA given hx of steroid use) -Medicare annual wellness visit    Levin Erp, MD [facility]

## 2023-04-18 ENCOUNTER — Ambulatory Visit (INDEPENDENT_AMBULATORY_CARE_PROVIDER_SITE_OTHER): Payer: 59 | Admitting: Student

## 2023-04-18 ENCOUNTER — Encounter: Payer: Self-pay | Admitting: Student

## 2023-04-18 VITALS — BP 136/66 | HR 60 | Ht 67.0 in | Wt 215.8 lb

## 2023-04-18 DIAGNOSIS — J4489 Other specified chronic obstructive pulmonary disease: Secondary | ICD-10-CM

## 2023-04-18 DIAGNOSIS — Z0000 Encounter for general adult medical examination without abnormal findings: Secondary | ICD-10-CM

## 2023-04-18 DIAGNOSIS — E119 Type 2 diabetes mellitus without complications: Secondary | ICD-10-CM

## 2023-04-18 DIAGNOSIS — K5904 Chronic idiopathic constipation: Secondary | ICD-10-CM | POA: Diagnosis not present

## 2023-04-18 DIAGNOSIS — R0981 Nasal congestion: Secondary | ICD-10-CM | POA: Diagnosis not present

## 2023-04-18 DIAGNOSIS — M3131 Wegener's granulomatosis with renal involvement: Secondary | ICD-10-CM | POA: Diagnosis not present

## 2023-04-18 DIAGNOSIS — Z1211 Encounter for screening for malignant neoplasm of colon: Secondary | ICD-10-CM

## 2023-04-18 DIAGNOSIS — I10 Essential (primary) hypertension: Secondary | ICD-10-CM

## 2023-04-18 DIAGNOSIS — Z794 Long term (current) use of insulin: Secondary | ICD-10-CM

## 2023-04-18 LAB — POCT GLYCOSYLATED HEMOGLOBIN (HGB A1C): HbA1c, POC (controlled diabetic range): 6.3 % (ref 0.0–7.0)

## 2023-04-18 MED ORDER — LINACLOTIDE 72 MCG PO CAPS: ORAL_CAPSULE | ORAL | 0 refills | Status: DC

## 2023-04-18 MED ORDER — ACCU-CHEK SOFTCLIX LANCET DEV KIT
PACK | 3 refills | Status: AC
Start: 2023-04-18 — End: ?

## 2023-04-18 MED ORDER — ACCU-CHEK GUIDE VI STRP
ORAL_STRIP | 12 refills | Status: AC
Start: 2023-04-18 — End: ?

## 2023-04-18 MED ORDER — ALBUTEROL SULFATE HFA 108 (90 BASE) MCG/ACT IN AERS
INHALATION_SPRAY | RESPIRATORY_TRACT | 2 refills | Status: DC
Start: 2023-04-18 — End: 2024-06-29

## 2023-04-18 MED ORDER — CETIRIZINE HCL 10 MG PO TABS: 10.0000 mg | ORAL_TABLET | Freq: Every day | ORAL | 2 refills | Status: DC

## 2023-04-18 NOTE — Assessment & Plan Note (Addendum)
Stable currently  -Refill albuterol -Continue maintenance inhaler Spiriva

## 2023-04-18 NOTE — Assessment & Plan Note (Signed)
She follows closely with [facility].  Not currently on steroids, only on Bactrim prophylaxis. -BMP -CBC

## 2023-04-18 NOTE — Assessment & Plan Note (Signed)
Patient's blood pressure somewhat controlled today. BP: 136/66. Goal of 130/80 given CKD. Patient's medication regimen includes 2 antihypertensives (she is unsure what they are called). -Patient to call clinic to let us know which 2 antihypertensives she is taking so we can adjust in future if needed -Labs: BMP -Follow up in 4 weeks

## 2023-04-18 NOTE — Assessment & Plan Note (Addendum)
A1c today 6.3.  Home regimen of Lantus 5 units daily. -Continue Lantus 5 units daily -Refilled blood glucose meter kit and device -Patient to check blood sugars and let me know at next appointment if having lows -CBC, BMP, future lipid panel

## 2023-04-18 NOTE — Assessment & Plan Note (Signed)
Has many care gaps that need to be filled.  Will start with colonoscopy referral given anemia and chronic constipation and history of polyps 1 of which was precancerous. -GI referral for colonoscopy -Return in 1 month for continued care (would prioritize DEXA given hx of steroid use) -Medicare annual wellness visit

## 2023-04-18 NOTE — Patient Instructions (Signed)
It was great to see you! Thank you for allowing me to participate in your care!   I recommend that you always bring your medications to each appointment as this makes it easy to ensure we are on the correct medications and helps [place] not miss when refills are needed.  Our plans for today:  -I have refilled your medications for you -Please call the clinic back to let us know what to make blood pressure medications you are taking -Would like to see you back in 2 to 4 weeks to make sure your blood pressures and check in to make sure you are up-to-date on your health maintenance -Stop at the front desk to schedule an annual wellness visit -I have placed a referral for GI for you to get another colonoscopy -Your A1c was 6.3-I have refilled your diabetes meter to make sure that we can check your blood sugars and make sure you are not having any low blood sugars  We are checking some labs today, I will call you if they are abnormal will send you a MyChart message or a letter if they are normal.  If you do not hear about your labs in the next 2 weeks please let us know.  Take care and seek immediate care sooner if you develop any concerns. Please remember to show up 15 minutes before your scheduled appointment time!  Levin Erp, MD [facility]

## 2023-04-19 ENCOUNTER — Telehealth: Payer: Self-pay | Admitting: Student

## 2023-04-19 NOTE — Telephone Encounter (Signed)
Called [facility] about patient's lab results. Per staff Randa Evens), her recent creatinine in July was 7.05 with BUN in the 60s. 7/18 note states she has not required dialysis and referral for vein and vascular when appropriate but are still continuing discussions. Considering transplant referral and also repeat kidney biopsy at some point as well. I have sent message to Dr. Signe Colt about patient's results as well and relayed this to [facility].  Called patient and confirmed DOB. Discussed lab results with her. Denies any symptoms of nausea or vomiting, no metallic taste in mouth, no feeling confused/tired at all. Discussed ED/return precautions with her and close nephrology follow up. She is aware and amenable.

## 2023-04-29 ENCOUNTER — Other Ambulatory Visit (HOSPITAL_COMMUNITY): Payer: Self-pay | Admitting: Nephrology

## 2023-04-29 DIAGNOSIS — M3131 Wegener's granulomatosis with renal involvement: Secondary | ICD-10-CM

## 2023-04-29 DIAGNOSIS — I7782 Antineutrophilic cytoplasmic antibody (ANCA) vasculitis: Secondary | ICD-10-CM

## 2023-04-29 DIAGNOSIS — N184 Chronic kidney disease, stage 4 (severe): Secondary | ICD-10-CM

## 2023-05-08 ENCOUNTER — Ambulatory Visit (HOSPITAL_COMMUNITY)
Admission: RE | Admit: 2023-05-08 | Discharge: 2023-05-08 | Disposition: A | Discharge status: Home / Self Care | Payer: 59 | Source: Ambulatory Visit | Attending: Nephrology | Admitting: Nephrology

## 2023-05-08 VITALS — BP 138/80 | HR 51 | Resp 19

## 2023-05-08 DIAGNOSIS — D631 Anemia in chronic kidney disease: Secondary | ICD-10-CM | POA: Diagnosis not present

## 2023-05-08 DIAGNOSIS — N184 Chronic kidney disease, stage 4 (severe): Secondary | ICD-10-CM | POA: Insufficient documentation

## 2023-05-08 DIAGNOSIS — N170 Acute kidney failure with tubular necrosis: Secondary | ICD-10-CM | POA: Insufficient documentation

## 2023-05-08 LAB — IRON AND TIBC
Iron: 57 ug/dL (ref 28–170)
Saturation Ratios: 17 % (ref 10.4–31.8)
TIBC: 337 ug/dL (ref 250–450)
UIBC: 280 ug/dL

## 2023-05-08 LAB — FERRITIN: Ferritin: 140 ng/mL (ref 11–307)

## 2023-05-08 LAB — POCT HEMOGLOBIN-HEMACUE: Hemoglobin: 10.6 g/dL — ABNORMAL LOW (ref 12.0–15.0)

## 2023-05-08 MED ORDER — DARBEPOETIN ALFA 100 MCG/0.5ML IJ SOSY
PREFILLED_SYRINGE | INTRAMUSCULAR | Status: AC
  Filled 2023-05-08: qty 0.5

## 2023-05-08 MED ORDER — DARBEPOETIN ALFA 100 MCG/0.5ML IJ SOSY
100.0000 ug | PREFILLED_SYRINGE | INTRAMUSCULAR | Status: DC
  Administered 2023-05-08: 100 ug via SUBCUTANEOUS

## 2023-05-14 ENCOUNTER — Other Ambulatory Visit: Payer: Self-pay | Admitting: Student

## 2023-05-16 ENCOUNTER — Other Ambulatory Visit: Payer: Self-pay | Admitting: Radiology

## 2023-05-16 DIAGNOSIS — N189 Chronic kidney disease, unspecified: Secondary | ICD-10-CM

## 2023-05-16 NOTE — H&P (Signed)
Chief Complaint: Patient was seen in consultation today for non-focal renal biopspy  Referring Physician(s): Upton,Elizabeth  Supervising Physician: Oley Balm  Patient Status: Catherine Aguilar - Out-pt  History of Present Illness: Catherine Aguilar is a 67 y.o. female with a medical history significant for HTN, COPD, CAD and granulomatosis with polyangiitis and renal/pulmonary involvement diagnosed in 2018. 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 Recent labs show worsening creatinine levels and her nephrology team has requested a repeat biopsy for further evaluation.   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   CVA (cerebral vascular accident) (HCC) 07/03/2012   Diffuse pulmonary alveolar hemorrhage    Edema of right lower extremity 03/25/2017   Essential hypertension 12/08/2016   Hemoptysis 12/08/2016   High cholesterol    HLD (hyperlipidemia) 12/08/2016   Hx of adenomatous polyp of colon 04/16/2017   Hypertension    stopped taking medications 10 years ago   Hypokalemia 07/02/2012   Intra-alveolar hemorrhage 12/08/2016   Left facial numbness 07/02/2012   Sepsis (HCC) 12/08/2016   Shortness of breath 12/02/2017   Stroke (HCC)    Substance abuse (HCC) 12/08/2016   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   Tobacco abuse 12/08/2016   Type 2 diabetes mellitus without complication, with long-term current use of insulin (HCC) 02/11/2017   Wegener's granulomatosis with renal  involvement (HCC) 12/08/2016    Past Surgical History:  Procedure Laterality Date   ABDOMINAL HYSTERECTOMY  1985    Allergies: Patient has no known allergies.  Medications: Prior to Admission medications   Medication Sig Start Date End Date Taking? Authorizing Provider  albuterol (PROVENTIL) (2.5 MG/3ML) 0.083% nebulizer solution Take 3 mLs (2.5 mg total) by nebulization every 6 (six) hours as needed for wheezing or shortness of breath. 11/06/20   Dollene Cleveland, DO  albuterol (VENTOLIN HFA) 108 (90 Base) MCG/ACT inhaler INHALE 2 PUFFS BY MOUTH EVERY 4-6 HOURS 04/18/23   Levin Erp, MD  aspirin 81 MG EC tablet TAKE 1 TABLET BY MOUTH EVERY DAY Patient taking differently: Take 81 mg by mouth daily. 08/09/20   Dollene Cleveland, DO  cetirizine (ZYRTEC) 10 MG tablet Take 1 tablet (10 mg total) by mouth daily. 04/18/23   Levin Erp, MD  fluticasone (FLONASE) 50 MCG/ACT nasal spray Place 2 sprays into both nostrils daily. Patient not taking: Reported on 06/26/2021 09/14/19   Glenford Bayley, NP  glucose blood (ACCU-CHEK GUIDE) test strip Check blood glucose prior to lantus administration 04/18/23   Levin Erp, MD  insulin glargine (LANTUS SOLOSTAR) 100 UNIT/ML Solostar Pen Inject 5 Units into the skin daily. 05/14/23   Levin Erp, MD  Lancets Misc. (ACCU-CHEK SOFTCLIX LANCET DEV) KIT Check BG prior to administering insulin 04/18/23   Levin Erp, MD  linaclotide (LINZESS) 72 MCG capsule TAKE 1 CAPSULE (72 MCG TOTAL) BY MOUTH DAILY BEFORE BREAKFAST. Strength: 72 mcg TAKE 1 CAPSULE (72 MCG TOTAL) BY MOUTH DAILY BEFORE BREAKFAST. Strength: 72 mcg 04/18/23   Levin Erp,  MD  pravastatin (PRAVACHOL) 40 MG tablet TAKE 1 TABLET BY MOUTH EVERY DAY AT BEDTIME FOR CHOLESTEROL 08/06/22   Ganta, Anupa, DO  sodium bicarbonate 650 MG tablet Take 2 tablets (1,300 mg total) by mouth 2 (two) times daily. 11/06/20   Dollene Cleveland, DO  sulfamethoxazole-trimethoprim (BACTRIM DS)  800-160 MG tablet TAKE 1 TABLET BY MOUTH THREE TIMES A WEEK. 12/11/22   Kalman Shan, MD  Tiotropium Bromide Monohydrate (SPIRIVA RESPIMAT) 1.25 MCG/ACT AERS Inhale 2 puffs into the lungs daily. 03/31/20   Kalman Shan, MD     Family History  Problem Relation Age of Onset   Cancer Father    Alcohol abuse Father    Diabetes Father    Hypertension Father    Heart attack Mother    Diabetes Mother    Hypertension Mother    Stroke Mother    Breast cancer Neg Hx    Colon cancer Neg Hx    Esophageal cancer Neg Hx    Rectal cancer Neg Hx    Stomach cancer Neg Hx    Pancreatic cancer Neg Hx     Social History   Socioeconomic History   Marital status: Married    Spouse name: Not on file   Number of children: Not on file   Years of education: Not on file   Highest education level: Not on file  Occupational History   Not on file  Tobacco Use   Smoking status: Former    Current packs/day: 0.00    Average packs/day: 0.2 packs/day for 50.5 years (10.1 ttl pk-yrs)    Types: Cigarettes    Start date: 09/10/1968    Quit date: 03/25/2019    Years since quitting: 4.1   Smokeless tobacco: Never  Substance and Sexual Activity   Alcohol use: Yes    Comment: on the weekends - moderately per pt   Drug use: Yes    Types: Marijuana    Comment: Friday, Saturday, Sunday   Sexual activity: Not on file  Other Topics Concern   Not on file  Social History Narrative   Not on file   Social Determinants of Health   Financial Resource Strain: Not on file  Food Insecurity: Not on file  Transportation Needs: Not on file  Physical Activity: Not on file  Stress: Not on file  Social Connections: Not on file    Review of Systems: A 12 point ROS discussed and pertinent positives are indicated in the HPI above.  All other systems are negative.  Review of Systems  Constitutional:  Negative for appetite change and fatigue.  Respiratory:  Negative for cough and shortness of breath.    Cardiovascular:  Negative for chest pain and leg swelling.  Gastrointestinal:  Negative for abdominal pain, diarrhea, nausea and vomiting.  Genitourinary:  Negative for difficulty urinating, flank pain and hematuria.  Musculoskeletal:  Negative for back pain.  Neurological:  Negative for dizziness and headaches.    Vital Signs: There were no vitals taken for this visit.  Physical Exam Constitutional:      General: She is not in acute distress.    Appearance: She is not ill-appearing.  HENT:     Mouth/Throat:     Mouth: Mucous membranes are moist.     Pharynx: Oropharynx is clear.  Pulmonary:     Effort: Pulmonary effort is normal.  Abdominal:     Tenderness: There is no abdominal tenderness.  Musculoskeletal:     Right lower leg: No edema.  Left lower leg: No edema.  Skin:    General: Skin is warm and dry.  Neurological:     Mental Status: She is alert and oriented to person, place, and time.  Psychiatric:        Mood and Affect: Mood normal.        Behavior: Behavior normal.        Thought Content: Thought content normal.        Judgment: Judgment normal.     Imaging: No results found.  Labs:  CBC: Recent Labs    03/13/23 1230 04/10/23 1211 04/18/23 1210 05/08/23 1217  WBC  --   --  7.9  --   HGB 11.8* 11.4* 10.1* 10.6*  HCT  --   --  32.4*  --   PLT  --   --  301  --     COAGS: No results for input(s): "INR", "APTT" in the last 8760 hours.  BMP: Recent Labs    04/18/23 1210  NA 139  K 4.7  CL 103  CO2 16*  GLUCOSE 232*  BUN 96*  CALCIUM 9.2  CREATININE 7.43*    LIVER FUNCTION TESTS: No results for input(s): "BILITOT", "AST", "ALT", "ALKPHOS", "PROT", "ALBUMIN" in the last 8760 hours.  TUMOR MARKERS: No results for input(s): "AFPTM", "CEA", "CA199", "CHROMGRNA" in the last 8760 hours.  Assessment and Plan:  ANCA-associated vasculitis; granulomatosis with polyangiitis with renal involvement: Catherine Aguilar, 67 year old female,  presents today to the [facility] Interventional Radiology department for an image-guided non-focal renal biopsy.   Risks and benefits of this procedure were discussed with the patient and/or patient's family including, but not limited to bleeding, infection, damage to adjacent structures or low yield requiring additional tests.  All of the questions were answered and there is agreement to proceed. She has been NPO. Her last dose of 81 mg aspirin was one week ago.   Consent signed and in chart. Labs and vitals are pending but will be reviewed prior to the start of the procedure.   Thank you for this interesting consult.  I greatly enjoyed meeting Catherine Aguilar and look forward to participating in their care.  A copy of this report was sent to the requesting provider on this date.  Electronically Signed: Alwyn Ren, AGACNP-BC [phone] 05/17/2023, 7:27 AM   I spent a total of  30 Minutes   in face to face in clinical consultation, greater than 50% of which was counseling/coordinating care for non-focal renal biopsy.

## 2023-05-17 ENCOUNTER — Ambulatory Visit (HOSPITAL_COMMUNITY)
Admission: RE | Admit: 2023-05-17 | Discharge: 2023-05-17 | Disposition: A | Discharge status: Home / Self Care | Payer: 59 | Source: Ambulatory Visit | Attending: Nephrology | Admitting: Nephrology

## 2023-05-17 DIAGNOSIS — I7782 Antineutrophilic cytoplasmic antibody (ANCA) vasculitis: Secondary | ICD-10-CM | POA: Insufficient documentation

## 2023-05-17 DIAGNOSIS — M3131 Wegener's granulomatosis with renal involvement: Secondary | ICD-10-CM | POA: Diagnosis not present

## 2023-05-17 DIAGNOSIS — N184 Chronic kidney disease, stage 4 (severe): Secondary | ICD-10-CM | POA: Diagnosis not present

## 2023-05-17 DIAGNOSIS — N189 Chronic kidney disease, unspecified: Secondary | ICD-10-CM

## 2023-05-17 LAB — PROTIME-INR
INR: 0.9 (ref 0.8–1.2)
Prothrombin Time: 12.7 s (ref 11.4–15.2)

## 2023-05-17 LAB — CBC
HCT: 32.6 % — ABNORMAL LOW (ref 36.0–46.0)
Hemoglobin: 10.1 g/dL — ABNORMAL LOW (ref 12.0–15.0)
MCH: 26.2 pg (ref 26.0–34.0)
MCHC: 31 g/dL (ref 30.0–36.0)
MCV: 84.5 fL (ref 80.0–100.0)
Platelets: 232 10*3/uL (ref 150–400)
RBC: 3.86 MIL/uL — ABNORMAL LOW (ref 3.87–5.11)
RDW: 16.4 % — ABNORMAL HIGH (ref 11.5–15.5)
WBC: 6.3 10*3/uL (ref 4.0–10.5)
nRBC: 0 % (ref 0.0–0.2)

## 2023-05-17 LAB — GLUCOSE, CAPILLARY: Glucose-Capillary: 117 mg/dL — ABNORMAL HIGH (ref 70–99)

## 2023-05-17 MED ORDER — HYDRALAZINE HCL 20 MG/ML IJ SOLN
INTRAMUSCULAR | Status: AC | PRN
  Administered 2023-05-17: 10 mg via INTRAVENOUS

## 2023-05-17 MED ORDER — SODIUM CHLORIDE 0.9 % IV SOLN: INTRAVENOUS | Status: DC

## 2023-05-17 MED ORDER — HYDROCODONE-ACETAMINOPHEN 5-325 MG PO TABS: 1.0000 | ORAL_TABLET | ORAL | Status: DC | PRN

## 2023-05-17 MED ORDER — LIDOCAINE HCL (PF) 1 % IJ SOLN
5.0000 mL | Freq: Once | INTRAMUSCULAR | Status: AC
  Administered 2023-05-17: 5 mL via INTRADERMAL

## 2023-05-17 MED ORDER — MIDAZOLAM HCL 2 MG/2ML IJ SOLN
INTRAMUSCULAR | Status: AC | PRN
Start: 2023-05-17 — End: 2023-05-17
  Administered 2023-05-17 (×2): 1 mg via INTRAVENOUS

## 2023-05-17 MED ORDER — MIDAZOLAM HCL 2 MG/2ML IJ SOLN
INTRAMUSCULAR | Status: AC
  Filled 2023-05-17: qty 4

## 2023-05-17 MED ORDER — HYDRALAZINE HCL 20 MG/ML IJ SOLN
10.0000 mg | Freq: Once | INTRAMUSCULAR | Status: AC
  Administered 2023-05-17: 10 mg via INTRAVENOUS
  Filled 2023-05-17: qty 1

## 2023-05-17 MED ORDER — FENTANYL CITRATE (PF) 100 MCG/2ML IJ SOLN
INTRAMUSCULAR | Status: AC
  Filled 2023-05-17: qty 4

## 2023-05-17 MED ORDER — FENTANYL CITRATE (PF) 100 MCG/2ML IJ SOLN
INTRAMUSCULAR | Status: AC | PRN
  Administered 2023-05-17: 50 ug via INTRAVENOUS

## 2023-05-17 MED ORDER — HYDRALAZINE HCL 20 MG/ML IJ SOLN
INTRAMUSCULAR | Status: AC
  Filled 2023-05-17: qty 1

## 2023-05-17 NOTE — Procedures (Signed)
  Procedure:  [employer] [employer] renal biopsy 16g x2 Preprocedure diagnosis: Diagnoses of CKD stage G4/A2, GFR 15-29 and albumin creatinine ratio 30-299 mg/g (HCC), ANCA-associated vasculitis (HCC), Granulomatosis with polyangiitis with renal involvement (HCC), and Chronic kidney disease, unspecified CKD stage were pertinent to this visit. Postprocedure diagnosis: same EBL:    minimal Complications:   none immediate  See full dictation in [employer].  Thora Lance MD Main # [phone] Pager  [phone] Mobile [phone]

## 2023-05-21 ENCOUNTER — Ambulatory Visit (INDEPENDENT_AMBULATORY_CARE_PROVIDER_SITE_OTHER): Payer: 59 | Admitting: *Deleted

## 2023-05-21 ENCOUNTER — Encounter (HOSPITAL_COMMUNITY): Payer: Self-pay

## 2023-05-21 DIAGNOSIS — Z1231 Encounter for screening mammogram for malignant neoplasm of breast: Secondary | ICD-10-CM

## 2023-05-21 DIAGNOSIS — Z1211 Encounter for screening for malignant neoplasm of colon: Secondary | ICD-10-CM | POA: Diagnosis not present

## 2023-05-21 DIAGNOSIS — Z0000 Encounter for general adult medical examination without abnormal findings: Secondary | ICD-10-CM

## 2023-05-21 DIAGNOSIS — Z780 Asymptomatic menopausal state: Secondary | ICD-10-CM

## 2023-05-21 NOTE — Patient Instructions (Signed)
Catherine Aguilar , Thank you for taking time to come for your Medicare Wellness Visit. I appreciate your ongoing commitment to your health goals. Please review the following plan we discussed and let me know if I can assist you in the future.   Screening recommendations/referrals: Colonoscopy: Education provided Mammogram: Education provided Bone Density: Education provided Recommended yearly ophthalmology/optometry visit for glaucoma screening and checkup Recommended yearly dental visit for hygiene and checkup  Vaccinations: Influenza vaccine: Education provided Pneumococcal vaccine: Education provided Tdap vaccine: Education provided Shingles vaccine: Education provided    Advanced directives: Education provided     Preventive Care 65 Years and Older, Female Preventive care refers to lifestyle choices and visits with your health care provider that can promote health and wellness. What does preventive care include? A yearly physical exam. This is also called an annual well check. Dental exams once or twice a year. Routine eye exams. Ask your health care provider how often you should have your eyes checked. Personal lifestyle choices, including: Daily care of your teeth and gums. Regular physical activity. Eating a healthy diet. Avoiding tobacco and drug use. Limiting alcohol use. Practicing safe sex. Taking low-dose aspirin every day. Taking vitamin and mineral supplements as recommended by your health care provider. What happens during an annual well check? The services and screenings done by your health care provider during your annual well check will depend on your age, overall health, lifestyle risk factors, and family history of disease. Counseling  Your health care provider may ask you questions about your: Alcohol use. Tobacco use. Drug use. Emotional well-being. Home and relationship well-being. Sexual activity. Eating habits. History of falls. Memory and ability  to understand (cognition). Work and work Astronomer. Reproductive health. Screening  You may have the following tests or measurements: Height, weight, and BMI. Blood pressure. Lipid and cholesterol levels. These may be checked every 5 years, or more frequently if you are over 18 years old. Skin check. Lung cancer screening. You may have this screening every year starting at age 17 if you have a 30-pack-year history of smoking and currently smoke or have quit within the past 15 years. Fecal occult blood test (FOBT) of the stool. You may have this test every year starting at age 46. Flexible sigmoidoscopy or colonoscopy. You may have a sigmoidoscopy every 5 years or a colonoscopy every 10 years starting at age 32. Hepatitis C blood test. Hepatitis B blood test. Sexually transmitted disease (STD) testing. Diabetes screening. This is done by checking your blood sugar (glucose) after you have not eaten for a while (fasting). You may have this done every 1-3 years. Bone density scan. This is done to screen for osteoporosis. You may have this done starting at age 22. Mammogram. This may be done every 1-2 years. Talk to your health care provider about how often you should have regular mammograms. Talk with your health care provider about your test results, treatment options, and if necessary, the need for more tests. Vaccines  Your health care provider may recommend certain vaccines, such as: Influenza vaccine. This is recommended every year. Tetanus, diphtheria, and acellular pertussis (Tdap, Td) vaccine. You may need a Td booster every 10 years. Zoster vaccine. You may need this after age 47. Pneumococcal 13-valent conjugate (PCV13) vaccine. One dose is recommended after age 33. Pneumococcal polysaccharide (PPSV23) vaccine. One dose is recommended after age 52. Talk to your health care provider about which screenings and vaccines you need and how often you need them. This information  is not  intended to replace advice given to you by your health care provider. Make sure you discuss any questions you have with your health care provider. Document Released: 09/23/2015 Document Revised: 05/16/2016 Document Reviewed: 06/28/2015 Elsevier Interactive Patient Education  2017 [employer].  Fall Prevention in the Home Falls can cause injuries. They can happen to people of all ages. There are many things you can do to make your home safe and to help prevent falls. What can I do on the outside of my home? Regularly fix the edges of walkways and driveways and fix any cracks. Remove anything that might make you trip as you walk through a door, such as a raised step or threshold. Trim any bushes or trees on the path to your home. Use bright outdoor lighting. Clear any walking paths of anything that might make someone trip, such as rocks or tools. Regularly check to see if handrails are loose or broken. Make sure that both sides of any steps have handrails. Any raised decks and porches should have guardrails on the edges. Have any leaves, snow, or ice cleared regularly. Use sand or salt on walking paths during winter. Clean up any spills in your garage right away. This includes oil or grease spills. What can I do in the bathroom? Use night lights. Install grab bars by the toilet and in the tub and shower. Do not use towel bars as grab bars. Use non-skid mats or decals in the tub or shower. If you need to sit down in the shower, use a plastic, non-slip stool. Keep the floor dry. Clean up any water that spills on the floor as soon as it happens. Remove soap buildup in the tub or shower regularly. Attach bath mats securely with double-sided non-slip rug tape. Do not have throw rugs and other things on the floor that can make you trip. What can I do in the bedroom? Use night lights. Make sure that you have a light by your bed that is easy to reach. Do not use any sheets or blankets that are  too big for your bed. They should not hang down onto the floor. Have a firm chair that has side arms. You can use this for support while you get dressed. Do not have throw rugs and other things on the floor that can make you trip. What can I do in the kitchen? Clean up any spills right away. Avoid walking on wet floors. Keep items that you use a lot in easy-to-reach places. If you need to reach something above you, use a strong step stool that has a grab bar. Keep electrical cords out of the way. Do not use floor polish or wax that makes floors slippery. If you must use wax, use non-skid floor wax. Do not have throw rugs and other things on the floor that can make you trip. What can I do with my stairs? Do not leave any items on the stairs. Make sure that there are handrails on both sides of the stairs and use them. Fix handrails that are broken or loose. Make sure that handrails are as long as the stairways. Check any carpeting to make sure that it is firmly attached to the stairs. Fix any carpet that is loose or worn. Avoid having throw rugs at the top or bottom of the stairs. If you do have throw rugs, attach them to the floor with carpet tape. Make sure that you have a light switch at the top of  the stairs and the bottom of the stairs. If you do not have them, ask someone to add them for you. What else can I do to help prevent falls? Wear shoes that: Do not have high heels. Have rubber bottoms. Are comfortable and fit you well. Are closed at the toe. Do not wear sandals. If you use a stepladder: Make sure that it is fully opened. Do not climb a closed stepladder. Make sure that both sides of the stepladder are locked into place. Ask someone to hold it for you, if possible. Clearly mark and make sure that you can see: Any grab bars or handrails. First and last steps. Where the edge of each step is. Use tools that help you move around (mobility aids) if they are needed. These  include: Canes. Walkers. Scooters. Crutches. Turn on the lights when you go into a dark area. Replace any light bulbs as soon as they burn out. Set up your furniture so you have a clear path. Avoid moving your furniture around. If any of your floors are uneven, fix them. If there are any pets around you, be aware of where they are. Review your medicines with your doctor. Some medicines can make you feel dizzy. This can increase your chance of falling. Ask your doctor what other things that you can do to help prevent falls. This information is not intended to replace advice given to you by your health care provider. Make sure you discuss any questions you have with your health care provider. Document Released: 06/23/2009 Document Revised: 02/02/2016 Document Reviewed: 10/01/2014 Elsevier Interactive Patient Education  2017 [employer].

## 2023-05-21 NOTE — Progress Notes (Signed)
Subjective:   Catherine Aguilar is a 67 y.o. female who presents for an Initial Medicare Annual Wellness Visit.  Visit Complete: Virtual  I connected with  Catherine Aguilar on 05/21/23 by a audio enabled telemedicine application and verified that I am speaking with the correct person using two identifiers.  Patient Location: Home  Provider Location: Home Office  I discussed the limitations of evaluation and management by telemedicine. The patient expressed understanding and agreed to proceed.  Vital Signs: Unable to obtain new vitals due to this being a telehealth visit. .  Review of Systems     Cardiac Risk Factors include: advanced age (>71men, >76 women);diabetes mellitus;hypertension;smoking/ tobacco exposure;obesity (BMI >30kg/m2)     Objective:    Today's Vitals   05/21/23 0802  PainSc: 0-No pain   There is no height or weight on file to calculate BMI.     05/21/2023    8:04 AM 05/17/2023    7:57 AM 04/18/2023    9:22 AM 06/26/2021    1:45 PM 02/17/2021    4:32 PM 05/30/2020    9:49 AM 11/22/2017   10:24 AM  Advanced Directives  Does Patient Have a Medical Advance Directive? No No No No No No No  Would patient like information on creating a medical advance directive?  No - Patient declined No - Patient declined No - Patient declined  No - Patient declined No - Patient declined    Current Medications (verified) Outpatient Encounter Medications as of 05/21/2023  Medication Sig   albuterol (PROVENTIL) (2.5 MG/3ML) 0.083% nebulizer solution Take 3 mLs (2.5 mg total) by nebulization every 6 (six) hours as needed for wheezing or shortness of breath.   albuterol (VENTOLIN HFA) 108 (90 Base) MCG/ACT inhaler INHALE 2 PUFFS BY MOUTH EVERY 4-6 HOURS   aspirin 81 MG EC tablet TAKE 1 TABLET BY MOUTH EVERY DAY (Patient taking differently: Take 81 mg by mouth daily.)   cetirizine (ZYRTEC) 10 MG tablet Take 1 tablet (10 mg total) by mouth daily.   glucose blood (ACCU-CHEK GUIDE) test  strip Check blood glucose prior to lantus administration   insulin glargine (LANTUS SOLOSTAR) 100 UNIT/ML Solostar Pen Inject 5 Units into the skin daily.   Lancets Misc. (ACCU-CHEK SOFTCLIX LANCET DEV) KIT Check BG prior to administering insulin   linaclotide (LINZESS) 72 MCG capsule TAKE 1 CAPSULE (72 MCG TOTAL) BY MOUTH DAILY BEFORE BREAKFAST. Strength: 72 mcg TAKE 1 CAPSULE (72 MCG TOTAL) BY MOUTH DAILY BEFORE BREAKFAST. Strength: 72 mcg   pravastatin (PRAVACHOL) 40 MG tablet TAKE 1 TABLET BY MOUTH EVERY DAY AT BEDTIME FOR CHOLESTEROL   sodium bicarbonate 650 MG tablet Take 2 tablets (1,300 mg total) by mouth 2 (two) times daily.   sulfamethoxazole-trimethoprim (BACTRIM DS) 800-160 MG tablet TAKE 1 TABLET BY MOUTH THREE TIMES A WEEK.   Tiotropium Bromide Monohydrate (SPIRIVA RESPIMAT) 1.25 MCG/ACT AERS Inhale 2 puffs into the lungs daily.   fluticasone (FLONASE) 50 MCG/ACT nasal spray Place 2 sprays into both nostrils daily. (Patient not taking: Reported on 06/26/2021)   No facility-administered encounter medications on file as of 05/21/2023.    Allergies (verified) Patient has no known allergies.   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   CVA (cerebral vascular accident) (HCC) 07/03/2012   Diffuse pulmonary alveolar hemorrhage    Edema of right lower extremity 03/25/2017   Essential hypertension 12/08/2016   Hemoptysis 12/08/2016   High cholesterol    HLD (hyperlipidemia) 12/08/2016   Hx of adenomatous polyp of colon 04/16/2017   Hypertension    stopped taking medications 10 years  ago   Hypokalemia 07/02/2012   Intra-alveolar hemorrhage 12/08/2016   Left facial numbness 07/02/2012   Sepsis (HCC) 12/08/2016   Shortness of breath 12/02/2017   Stroke (HCC)    Substance abuse (HCC) 12/08/2016   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   Tobacco abuse 12/08/2016   Type 2 diabetes mellitus without complication, with long-term current use of insulin (HCC) 02/11/2017   Wegener's granulomatosis with renal involvement (HCC) 12/08/2016   Past Surgical History:  Procedure Laterality Date   ABDOMINAL HYSTERECTOMY  1985   Family History  Problem Relation Age of Onset   Cancer Father    Alcohol abuse Father    Diabetes Father    Hypertension Father    Heart attack Mother    Diabetes Mother    Hypertension Mother    Stroke Mother    Breast cancer Neg Hx    Colon cancer Neg Hx    Esophageal cancer Neg Hx    Rectal cancer Neg Hx    Stomach cancer Neg Hx    Pancreatic cancer Neg Hx    Social History   Socioeconomic History   Marital status: Married    Spouse name: Not on file   Number of children: Not on file   Years of education: Not on file   Highest education level: Not on file  Occupational History   Not on file  Tobacco Use   Smoking status: Former    Current packs/day: 0.00    Average packs/day: 0.2 packs/day for 50.5 years (10.1 ttl pk-yrs)    Types: Cigarettes    Start date: 09/10/1968    Quit date: 03/25/2019    Years since quitting: 4.1   Smokeless tobacco: Never  Substance and Sexual Activity   Alcohol use: Yes    Comment: on the weekends - moderately per pt   Drug use: Yes    Types: Marijuana    Comment: Friday, Saturday, Sunday   Sexual activity: Not Currently  Other Topics Concern   Not on file  Social History Narrative   Not on file   Social Determinants of Health   Financial Resource Strain: Low Risk  (05/21/2023)   Overall Financial Resource Strain (CARDIA)    Difficulty of Paying Living Expenses: Not hard at all  Food Insecurity: No  Food Insecurity (05/21/2023)   Hunger Vital Sign    Worried About Radiation protection practitioner of Food in the Last Year: Never true    Ran Out of Food in the Last Year: Never true  Transportation Needs: No Transportation Needs (05/21/2023)   PRAPARE - Administrator, Civil Service (Medical): No    Lack of Transportation (Non-Medical): No  Physical Activity: Inactive (05/21/2023)   Exercise Vital Sign    Days of Exercise per Week: 0 days    Minutes of Exercise per Session: 0 min  Stress: No Stress Concern Present (05/21/2023)   [employer] of Occupational Health - Occupational Stress Questionnaire    Feeling of Stress : Not at  all  Social Connections: Moderately Isolated (05/21/2023)   Social Connection and Isolation Panel [NHANES]    Frequency of Communication with Friends and Family: Once a week    Frequency of Social Gatherings with Friends and Family: Once a week    Attends Religious Services: More than 4 times per year    Active Member of [employer] or Organizations: No    Attends Engineer, structural: Never    Marital Status: Married    Tobacco Counseling Counseling given: Not Answered   Clinical Intake:  Pre-visit preparation completed: Yes  Pain : No/denies pain Pain Score: 0-No pain     Diabetes: Yes CBG done?: No Did pt. bring in CBG monitor from home?: No  How often do you need to have someone help you when you read instructions, pamphlets, or other written materials from your doctor or pharmacy?: 1 - Never  Interpreter Needed?: No  Information entered by :: Remi Haggard LPN   Activities of Daily Living    05/21/2023    8:18 AM  In your present state of health, do you have any difficulty performing the following activities:  Hearing? 0  Vision? 0  Difficulty concentrating or making decisions? 0  Walking or climbing stairs? 0  Dressing or bathing? 0  Doing errands, shopping? 0  Preparing Food and eating ? N  Using the Toilet? N  In the past six  months, have you accidently leaked urine? N  Do you have problems with loss of bowel control? N  Managing your Medications? N  Managing your Finances? N  Housekeeping or managing your Housekeeping? N    Patient Care Team: Levin Erp, MD as PCP - General (Family Medicine)  Indicate any recent Medical Services you may have received from other than [facility] providers in the past year (date may be approximate).     Assessment:   This is a routine wellness examination for Catherine Aguilar.  Hearing/Vision screen Hearing Screening - Comments:: No trouble hearing Vision Screening - Comments:: Up to date [facility]   Goals Addressed             This Visit's Progress    Patient Stated       Continue current lifestyle       Depression Screen    05/21/2023    8:07 AM 04/18/2023    9:21 AM 06/26/2021    1:44 PM 05/30/2020    9:50 AM 11/22/2017   10:24 AM 03/25/2017   10:38 AM 02/11/2017    9:37 AM  PHQ 2/9 Scores  PHQ - 2 Score 0 0 0 0 0 0 0  PHQ- 9 Score 0 1 0 0       Fall Risk    05/21/2023    8:03 AM 04/18/2023    9:21 AM 06/26/2021    1:44 PM 05/30/2020    9:51 AM  Fall Risk   Falls in the past year? 0 0 0 0  Number falls in past yr: 0 0 0 0  Injury with Fall? 0 0 0 0  Follow up Falls evaluation completed;Education provided;Falls prevention discussed       MEDICARE RISK AT HOME: Medicare Risk at Home Any stairs in or around the home?: No If so, are there any without handrails?: No Home free of loose throw rugs in walkways, pet beds, electrical cords, etc?: Yes Adequate lighting in your home to reduce risk of falls?: Yes Life alert?: No Use of a cane, walker or w/c?: No Grab bars  in the bathroom?: Yes Shower chair or bench in shower?: Yes Elevated toilet seat or a handicapped toilet?: No  TIMED UP AND GO:  Was the test performed? No    Cognitive Function:        05/21/2023    8:05 AM  6CIT Screen  What Year? 0 points  What month? 0 points  What time? 0 points   Count back from 20 0 points  Months in reverse 0 points  Repeat phrase 2 points  Total Score 2 points    Immunizations Immunization History  Administered Date(s) Administered   Fluad Quad(high Dose 65+) 06/01/2021   Influenza Split 07/02/2012   Influenza,inj,Quad PF,6+ Mos 05/03/2017, 05/13/2018, 06/08/2019   PFIZER Comirnaty(Gray Top)Covid-19 Tri-Sucrose Vaccine 03/05/2020, 03/26/2020, 10/10/2020, 01/23/2021   Pfizer Covid-19 Vaccine Bivalent Booster 5yrs & up 06/26/2021   Pneumococcal Polysaccharide-23 07/02/2012, 11/22/2017, 05/30/2020    TDAP status: Due, Education has been provided regarding the importance of this vaccine. Advised may receive this vaccine at local pharmacy or Health Dept. Aware to provide a copy of the vaccination record if obtained from local pharmacy or Health Dept. Verbalized acceptance and understanding.  Flu Vaccine status: Due, Education has been provided regarding the importance of this vaccine. Advised may receive this vaccine at local pharmacy or Health Dept. Aware to provide a copy of the vaccination record if obtained from local pharmacy or Health Dept. Verbalized acceptance and understanding.  Pneumococcal vaccine status: Due, Education has been provided regarding the importance of this vaccine. Advised may receive this vaccine at local pharmacy or Health Dept. Aware to provide a copy of the vaccination record if obtained from local pharmacy or Health Dept. Verbalized acceptance and understanding.  Covid-19 vaccine status: Information provided on how to obtain vaccines.   Qualifies for Shingles Vaccine? Yes   Zostavax completed No   Shingrix Completed?: No.    Education has been provided regarding the importance of this vaccine. Patient has been advised to call insurance company to determine out of pocket expense if they have not yet received this vaccine. Advised may also receive vaccine at local pharmacy or Health Dept. Verbalized acceptance and  understanding.  Screening Tests Health Maintenance  Topic Date Due   Zoster Vaccines- Shingrix (1 of 2) Never done   DEXA SCAN  Never done   Pneumonia Vaccine 31+ Years old (2 of 2 - PCV) 05/30/2021   OPHTHALMOLOGY EXAM  09/12/2021   Colonoscopy  04/10/2022   FOOT EXAM  06/26/2022   MAMMOGRAM  01/17/2023   INFLUENZA VACCINE  04/11/2023   COVID-19 Vaccine (6 - 2023-24 season) 06/06/2023 (Originally 05/12/2023)   HEMOGLOBIN A1C  10/19/2023   Diabetic kidney evaluation - Urine ACR  03/28/2024   Diabetic kidney evaluation - eGFR measurement  04/17/2024   Medicare Annual Wellness (AWV)  05/20/2024   Hepatitis C Screening  Completed   HPV VACCINES  Aged Out   DTaP/Tdap/Td  Discontinued    Health Maintenance  Health Maintenance Due  Topic Date Due   Zoster Vaccines- Shingrix (1 of 2) Never done   DEXA SCAN  Never done   Pneumonia Vaccine 42+ Years old (2 of 2 - PCV) 05/30/2021   OPHTHALMOLOGY EXAM  09/12/2021   Colonoscopy  04/10/2022   FOOT EXAM  06/26/2022   MAMMOGRAM  01/17/2023   INFLUENZA VACCINE  04/11/2023    Colorectal cancer screening: Referral to GI placed  . Pt aware the office will call re: appt.  Mammogram status: Ordered  . Pt provided  with contact info and advised to call to schedule appt.   Bone Density status: Ordered  . Pt provided with contact info and advised to call to schedule appt.  Lung Cancer Screening: (Low Dose CT Chest recommended if Age 55-80 years, 20 pack-year currently smoking OR have quit w/in 15years.) does not qualify.   Lung Cancer Screening Referral:   Additional Screening:  Hepatitis C Screening: does not qualify; Completed 2021  Vision Screening: Recommended annual ophthalmology exams for early detection of glaucoma and other disorders of the eye. Is the patient up to date with their annual eye exam?  Yes  Who is the provider or what is the name of the office in which the patient attends annual eye exams? [facility] If pt is not  established with a provider, would they like to be referred to a provider to establish care? No .   Dental Screening: Recommended annual dental exams for proper oral hygiene  Nutrition Risk Assessment:  Has the patient had any N/V/D within the last 2 months?  No  Does the patient have any non-healing wounds?  No  Has the patient had any unintentional weight loss or weight gain?  No   Diabetes:  Is the patient diabetic?  Yes  If diabetic, was a CBG obtained today?  No  Did the patient bring in their glucometer from home?  No  How often do you monitor your CBG's? 1 x a week.   Financial Strains and Diabetes Management:  Are you having any financial strains with the device, your supplies or your medication? No .  Does the patient want to be seen by Chronic Care Management for management of their diabetes?  No  Would the patient like to be referred to a Nutritionist or for Diabetic Management?  No   Diabetic Exams:  Diabetic Eye Exam: Completed . Pt has been advised about the importance in completing this exam. A referral has been placed today. Message sent to referral coordinator for scheduling purposes.   Diabetic Foot Exam: . Pt has been advised about the importance in completing this exam.   Community Resource Referral / Chronic Care Management: CRR required this visit?  No   CCM required this visit?  No     Plan:     I have personally reviewed and noted the following in the patient's chart:   Medical and social history Use of alcohol, tobacco or illicit drugs  Current medications and supplements including opioid prescriptions. Patient is not currently taking opioid prescriptions. Functional ability and status Nutritional status Physical activity Advanced directives List of other physicians Hospitalizations, surgeries, and ER visits in previous 12 months Vitals Screenings to include cognitive, depression, and falls Referrals and appointments  In addition, I have  reviewed and discussed with patient certain preventive protocols, quality metrics, and best practice recommendations. A written personalized care plan for preventive services as well as general preventive health recommendations were provided to patient.     Remi Haggard, LPN   12/01/4008   After Visit Summary: (MyChart) Due to this being a telephonic visit, the after visit summary with patients personalized plan was offered to patient via MyChart   Nurse Notes:

## 2023-05-22 DIAGNOSIS — H04123 Dry eye syndrome of bilateral lacrimal glands: Secondary | ICD-10-CM | POA: Diagnosis not present

## 2023-05-22 DIAGNOSIS — H25812 Combined forms of age-related cataract, left eye: Secondary | ICD-10-CM | POA: Diagnosis not present

## 2023-05-22 DIAGNOSIS — H1045 Other chronic allergic conjunctivitis: Secondary | ICD-10-CM | POA: Diagnosis not present

## 2023-05-22 DIAGNOSIS — E119 Type 2 diabetes mellitus without complications: Secondary | ICD-10-CM | POA: Diagnosis not present

## 2023-05-22 DIAGNOSIS — Z794 Long term (current) use of insulin: Secondary | ICD-10-CM | POA: Diagnosis not present

## 2023-05-28 LAB — SURGICAL PATHOLOGY

## 2023-05-29 DIAGNOSIS — N185 Chronic kidney disease, stage 5: Secondary | ICD-10-CM | POA: Diagnosis not present

## 2023-05-29 DIAGNOSIS — D631 Anemia in chronic kidney disease: Secondary | ICD-10-CM | POA: Diagnosis not present

## 2023-05-29 DIAGNOSIS — N189 Chronic kidney disease, unspecified: Secondary | ICD-10-CM | POA: Diagnosis not present

## 2023-05-29 DIAGNOSIS — I120 Hypertensive chronic kidney disease with stage 5 chronic kidney disease or end stage renal disease: Secondary | ICD-10-CM | POA: Diagnosis not present

## 2023-05-29 DIAGNOSIS — M3131 Wegener's granulomatosis with renal involvement: Secondary | ICD-10-CM | POA: Diagnosis not present

## 2023-05-29 DIAGNOSIS — N184 Chronic kidney disease, stage 4 (severe): Secondary | ICD-10-CM | POA: Diagnosis not present

## 2023-05-29 DIAGNOSIS — I7782 Antineutrophilic cytoplasmic antibody (ANCA) vasculitis: Secondary | ICD-10-CM | POA: Diagnosis not present

## 2023-06-05 ENCOUNTER — Ambulatory Visit (HOSPITAL_COMMUNITY)
Admission: RE | Admit: 2023-06-05 | Discharge: 2023-06-05 | Disposition: A | Discharge status: Home / Self Care | Payer: 59 | Source: Ambulatory Visit | Attending: Nephrology | Admitting: Nephrology

## 2023-06-05 VITALS — BP 168/80 | HR 57 | Temp 97.4°F | Resp 18

## 2023-06-05 DIAGNOSIS — D631 Anemia in chronic kidney disease: Secondary | ICD-10-CM | POA: Diagnosis not present

## 2023-06-05 DIAGNOSIS — N184 Chronic kidney disease, stage 4 (severe): Secondary | ICD-10-CM | POA: Insufficient documentation

## 2023-06-05 DIAGNOSIS — N170 Acute kidney failure with tubular necrosis: Secondary | ICD-10-CM | POA: Diagnosis not present

## 2023-06-05 LAB — POCT HEMOGLOBIN-HEMACUE: Hemoglobin: 10 g/dL — ABNORMAL LOW (ref 12.0–15.0)

## 2023-06-05 LAB — FERRITIN: Ferritin: 118 ng/mL (ref 11–307)

## 2023-06-05 LAB — IRON AND TIBC
Iron: 58 ug/dL (ref 28–170)
Saturation Ratios: 17 % (ref 10.4–31.8)
TIBC: 342 ug/dL (ref 250–450)
UIBC: 284 ug/dL

## 2023-06-05 MED ORDER — DARBEPOETIN ALFA 100 MCG/0.5ML IJ SOSY
100.0000 ug | PREFILLED_SYRINGE | INTRAMUSCULAR | Status: DC
  Administered 2023-06-05: 100 ug via SUBCUTANEOUS

## 2023-06-05 MED ORDER — DARBEPOETIN ALFA 100 MCG/0.5ML IJ SOSY
PREFILLED_SYRINGE | INTRAMUSCULAR | Status: AC
  Filled 2023-06-05: qty 0.5

## 2023-06-07 ENCOUNTER — Other Ambulatory Visit: Payer: Self-pay | Admitting: Internal Medicine

## 2023-06-18 ENCOUNTER — Other Ambulatory Visit: Payer: Self-pay | Admitting: Internal Medicine

## 2023-06-18 ENCOUNTER — Telehealth: Payer: Self-pay | Admitting: Internal Medicine

## 2023-06-18 MED ORDER — SULFAMETHOXAZOLE-TRIMETHOPRIM 800-160 MG PO TABS: 1.0000 | ORAL_TABLET | ORAL | 0 refills | Status: DC

## 2023-06-18 NOTE — Telephone Encounter (Signed)
Pt requesting refill on her Bactrim [facility] [address] Per chart last office visit in 05/2021 Called patient and made aware.  Next avail 10/28 @ 1 pm scheduled.

## 2023-06-18 NOTE — Telephone Encounter (Signed)
Patient states needs refill for Bactrim. Pharmacy is [facility] [address] Patient phone number is [phone].

## 2023-06-18 NOTE — Telephone Encounter (Signed)
If she still getting her Rituxan every 6 months?  Okay to refill Bactrim till the next visit 1 double-stranded once daily Bactrim Monday Wednesday Friday [which is 3 times a week]   After this without making a follow-up visit we will not refill her Bactrim

## 2023-06-18 NOTE — Telephone Encounter (Signed)
Pt aware she will need f/u to continue Bactrim refills and yes she is still getting Rituxan.

## 2023-06-18 NOTE — Telephone Encounter (Signed)
LMTCB x 1 

## 2023-06-19 ENCOUNTER — Encounter (HOSPITAL_COMMUNITY): Payer: 59

## 2023-06-26 LAB — HM DIABETES EYE EXAM

## 2023-07-03 ENCOUNTER — Ambulatory Visit (HOSPITAL_COMMUNITY)
Admission: RE | Admit: 2023-07-03 | Discharge: 2023-07-03 | Disposition: A | Discharge status: Home / Self Care | Payer: 59 | Source: Ambulatory Visit | Attending: Nephrology | Admitting: Nephrology

## 2023-07-03 VITALS — BP 121/97 | HR 50 | Temp 97.8°F | Resp 18

## 2023-07-03 DIAGNOSIS — D631 Anemia in chronic kidney disease: Secondary | ICD-10-CM | POA: Diagnosis not present

## 2023-07-03 DIAGNOSIS — N184 Chronic kidney disease, stage 4 (severe): Secondary | ICD-10-CM | POA: Diagnosis not present

## 2023-07-03 DIAGNOSIS — N170 Acute kidney failure with tubular necrosis: Secondary | ICD-10-CM

## 2023-07-03 LAB — FERRITIN: Ferritin: 145 ng/mL (ref 11–307)

## 2023-07-03 LAB — IRON AND TIBC
Iron: 69 ug/dL (ref 28–170)
Saturation Ratios: 19 % (ref 10.4–31.8)
TIBC: 361 ug/dL (ref 250–450)
UIBC: 292 ug/dL

## 2023-07-03 LAB — POCT HEMOGLOBIN-HEMACUE: Hemoglobin: 9.3 g/dL — ABNORMAL LOW (ref 12.0–15.0)

## 2023-07-03 MED ORDER — DARBEPOETIN ALFA 100 MCG/0.5ML IJ SOSY
PREFILLED_SYRINGE | INTRAMUSCULAR | Status: AC
  Administered 2023-07-03: 100 ug via SUBCUTANEOUS
  Filled 2023-07-03: qty 0.5

## 2023-07-03 MED ORDER — DARBEPOETIN ALFA 100 MCG/0.5ML IJ SOSY: 100.0000 ug | PREFILLED_SYRINGE | INTRAMUSCULAR | Status: DC

## 2023-07-08 ENCOUNTER — Ambulatory Visit: Payer: 59 | Admitting: Internal Medicine

## 2023-07-11 ENCOUNTER — Other Ambulatory Visit: Payer: Self-pay | Admitting: Internal Medicine

## 2023-07-12 DIAGNOSIS — H25812 Combined forms of age-related cataract, left eye: Secondary | ICD-10-CM | POA: Diagnosis not present

## 2023-07-19 ENCOUNTER — Ambulatory Visit
Admission: RE | Admit: 2023-07-19 | Discharge: 2023-07-19 | Disposition: A | Discharge status: Home / Self Care | Payer: 59 | Source: Ambulatory Visit | Attending: Family Medicine | Admitting: Family Medicine

## 2023-07-19 DIAGNOSIS — Z1231 Encounter for screening mammogram for malignant neoplasm of breast: Secondary | ICD-10-CM | POA: Diagnosis not present

## 2023-07-25 ENCOUNTER — Other Ambulatory Visit: Payer: Self-pay | Admitting: Student

## 2023-07-25 DIAGNOSIS — E119 Type 2 diabetes mellitus without complications: Secondary | ICD-10-CM

## 2023-07-25 MED ORDER — INSULIN PEN NEEDLE 31G X 5 MM MISC: 1.0000 | Freq: Every day | 0 refills | Status: DC

## 2023-07-26 ENCOUNTER — Encounter: Payer: Self-pay | Admitting: Internal Medicine

## 2023-07-26 ENCOUNTER — Ambulatory Visit (INDEPENDENT_AMBULATORY_CARE_PROVIDER_SITE_OTHER): Payer: 59 | Admitting: Internal Medicine

## 2023-07-26 VITALS — BP 126/80 | HR 51 | Ht 67.0 in | Wt 213.8 lb

## 2023-07-26 DIAGNOSIS — Z79620 Long term (current) use of immunosuppressive biologic: Secondary | ICD-10-CM

## 2023-07-26 DIAGNOSIS — Z5181 Encounter for therapeutic drug level monitoring: Secondary | ICD-10-CM

## 2023-07-26 DIAGNOSIS — F1210 Cannabis abuse, uncomplicated: Secondary | ICD-10-CM

## 2023-07-26 DIAGNOSIS — J439 Emphysema, unspecified: Secondary | ICD-10-CM | POA: Diagnosis not present

## 2023-07-26 DIAGNOSIS — N184 Chronic kidney disease, stage 4 (severe): Secondary | ICD-10-CM | POA: Diagnosis not present

## 2023-07-26 DIAGNOSIS — M3131 Wegener's granulomatosis with renal involvement: Secondary | ICD-10-CM

## 2023-07-26 DIAGNOSIS — Z23 Encounter for immunization: Secondary | ICD-10-CM | POA: Diagnosis not present

## 2023-07-26 LAB — BASIC METABOLIC PANEL
BUN: 82 mg/dL — ABNORMAL HIGH (ref 6–23)
CO2: 22 meq/L (ref 19–32)
Calcium: 9.6 mg/dL (ref 8.4–10.5)
Chloride: 106 meq/L (ref 96–112)
Creatinine, Ser: 8.49 mg/dL (ref 0.40–1.20)
GFR: 4.45 mL/min — CL (ref >60.00)
Glucose, Bld: 114 mg/dL — ABNORMAL HIGH (ref 70–99)
Potassium: 5 meq/L (ref 3.5–5.1)
Sodium: 138 meq/L (ref 135–145)

## 2023-07-26 LAB — CBC WITH DIFFERENTIAL/PLATELET
Basophils Absolute: 0 10*3/uL (ref 0.0–0.1)
Basophils Relative: 0.5 % (ref 0.0–3.0)
Eosinophils Absolute: 0.1 10*3/uL (ref 0.0–0.7)
Eosinophils Relative: 2 % (ref 0.0–5.0)
HCT: 32.8 % — ABNORMAL LOW (ref 36.0–46.0)
Hemoglobin: 10.3 g/dL — ABNORMAL LOW (ref 12.0–15.0)
Lymphocytes Relative: 21.6 % (ref 12.0–46.0)
Lymphs Abs: 1.2 10*3/uL (ref 0.7–4.0)
MCHC: 31.4 g/dL (ref 30.0–36.0)
MCV: 83.1 fL (ref 78.0–100.0)
Monocytes Absolute: 0.4 10*3/uL (ref 0.1–1.0)
Monocytes Relative: 8.1 % (ref 3.0–12.0)
Neutro Abs: 3.7 10*3/uL (ref 1.4–7.7)
Neutrophils Relative %: 67.8 % (ref 43.0–77.0)
Platelets: 193 10*3/uL (ref 150.0–400.0)
RBC: 3.95 Mil/uL (ref 3.87–5.11)
RDW: 16.2 % — ABNORMAL HIGH (ref 11.5–15.5)
WBC: 5.5 10*3/uL (ref 4.0–10.5)

## 2023-07-26 LAB — HEPATIC FUNCTION PANEL
ALT: 22 U/L (ref 0–35)
AST: 17 U/L (ref 0–37)
Albumin: 4.3 g/dL (ref 3.5–5.2)
Alkaline Phosphatase: 75 U/L (ref 39–117)
Bilirubin, Direct: 0 mg/dL (ref 0.0–0.3)
Total Bilirubin: 0.3 mg/dL (ref 0.2–1.2)
Total Protein: 6.9 g/dL (ref 6.0–8.3)

## 2023-07-26 NOTE — Progress Notes (Signed)
OV 05/26/2018  Subjective:  Patient ID: Catherine Aguilar, female , DOB: 06-Jan-1956 , age 67 y.o. , MRN: [id number] , ADDRESS: [address] [place] [address]   OV 02/12/2017  Chief Complaint  Patient presents with   Follow-up    Pt states her breathing is doing well. Pt denies any cough, SOB, CP/tightness, and f/c/s.    Follow-up severe mid vasculitis syndrome with life-threatening alveolar hemorrhage and acute renal failure is easter weekend 2018  Currently she is doing well. She has finished 4 doses of Rituxan. Last dose was 01/31/2017. There was significant delay between the first 2 doses which were on schedule in the third and fourth doses delayed. Still on prednisone 60 mg per day. She is accompanied by her daughter [name]. Overall she's doing well. This no hemoptysis anymore. This no shortness of breath. Her G6PD was normal in May 2018. Her Quantiferon Gold was normal. Her most recent chemistries were in May 2018 and a creatinine that improved with 3 mg percent approximately.  OV 05/03/2017  Chief Complaint  Patient presents with   Follow-up    Pt states her breathing is unchanged since last OV. Pt denies cough, CP/tightness, f/c/s.       67 year old female with systemic vasculitis syndrome. Last dose of Rituxan was in May 2018. She is currently tapered her prednisone down to 10 mg per day. She is on Bactrim prophylaxis. She has visited with nephrology Dr. Domenic Schwab in the last 1 month. According to her history blood tests have improved. She does not want to have repeat blood test today. Last blood test was within a month. She is getting iron infusions at short stay. According to the daughter blood pressure was high and one of the infusions was held off. There is no hemoptysis chest pain or hematuria or any clinical evidence of vasculitis. She is due for flu shot.   OV 09/19/2017  Chief Complaint  Patient presents with   Follow-up    Pt was seen at the hospital  09/02/17 due to breathing problems and after doing breathing treatments, was sent home. Other than that, pt states that she has been doing good.     67 year old female with systemic vasculitis syndrome.  Last dose of Rituxan May 2018.  She had life-threatening hemoptysis and respiratory failure and renal failure back in early 2018.  Last seen in August 2018.  After that she did not follow-up.  She is here with her daughter.  It appears that Christmas Eve 2018 she decided to smoke cigarettes because of the holidays when she got acute respiratory exacerbation with wheezing and went to the emergency department.  I personally reviewed the chart and the chest x-ray that was clear.  She was discharged with a steroid burst.  Now she is back to baseline prednisone 10 mg/day and her baseline Bactrim for her Wegener's vasculitis.  She tells me that she is getting Rituxan shots once a month at the short stay hospital at [facility] but when I reviewed the chart this is actually Aranesp.  She is due for a B-cell flow cytometry test today because it has been 9-10 months.  Overall she is feeling fine except fatigue.  She tells me that she does not have any eye issues or neuritis of mononeuritis multiplex or renal issues although most recent creatinine continues to be on 3 mg percent.  There is no hemoptysis or shortness of breath currently.   OV 01/21/2018  Chief Complaint  Patient  presents with   Follow-up    Pt states she has been doing good since last visit but states she has an uncontrollable cough that she has had since March 2018 that will not go away. Pt is not coughing up any mucus but does have postnasal drainage that is clear.    67 year old female with systemic vasculitis syndrome with life-threatening hemoptysis and respiratory and renal failure in early 2018.Marland Kitchen  Last dose of Rituxan May 2018.  Since then on daily prednisone and Bactrim prophylaxis.  Last B-cell flow cytometry January 2019 showing  suppression of B cells.  She presents with her daughter.  She as usual is a poor historian.  As best as I can gather it appears since her last visit she is having recurrent episodes of bronchitis and has been treated with 2 rounds of antibiotics/2 rounds of prednisone burst including a February 2019 visit to the emergency department where she had a chest x-ray that I personally visualized and it looks clear.  She also feels that she is having a chronic cough that is worse than her baseline.  She tells me now that she has had this cough since March 2018 when she suffered from vasculitis syndrome but the daughter says that it is certainly worse in the last few months since the last visit associated with chest tightness.  There is no hemoptysis or edema.  In the last week or so she feels like she has had an acute sinusitis with yellow sputum and she wants a different antibiotic.  There is no fever or hemoptysis or edema orthopnea proximal nocturnal dyspnea or weight loss. FeNO 8ppb and does not suggest asthma    05/26/2018 -   Chief Complaint  Patient presents with   Follow-up    Pt states she has been doing well since last visit except she states still has the uncontrollable cough that she is unable to get rid of. States she is coughing up clear to light yellow to green mucus and has some occ SOB when exerting herself.    67 year old female with systemic vasculitis syndrome with life-threatening hemoptysis and respiratory and renal failure in early 2018.Marland Kitchen  Last dose of Rituxan May 2018.  Since then on daily prednisone and Bactrim prophylaxis.  Last B-cell flow cytometry January 2019 showing suppression of B cells but in summer 2019 emergence with 3% B cells associated with rising MPO tites in renal clinic summer 2019. Rx for relapose 1000mg  IV rituxan - May 08, 2018. Prednison not in [facility]  Sept 2019   Catherine Aguilar 67 y.o. -presents for follow-up. Last seen in May 2019. At that time B-cell flow  cytometry was repeated and showed a 3% B-cell population which was the beginning of emergence following Rituxan a year earlier. At that point in her she was feeling okay but she was still smoking. She also had chronic cough which was controlled by  trelegy inhaler. She subsequently saw Dr. Eliott Nine in nephrology and it was noticed that her MPO titer was coming back and clinically Dr. Eliott Nine felt that she was relapsing. Therefore we arranged for single dose Rituxan thousand milligrams which was administered end of August 2019 without any problems. At this point in time her main issues that she continues to smoke. She has a chronic cough which she says is severe but happens only every few weeks or every several days. She is not taking her inhaler regularly. Review of radiology showed last CT scan of the chestwas early in 2018 and  a chest x-ray in February 2019 that was clear. She continues on Bactrim. I do not see prednisone thought at last viit instructed to take pred 5mg  per day but she says she "finished it" which was not her instruction    OV 09/23/2018  Subjective:  Patient ID: Catherine Aguilar, female , DOB: 08/29/1956 , age 49 y.o. , MRN: [id number] , ADDRESS: [address] [place] [address]   67 year old female with systemic vasculitis syndrome with life-threatening hemoptysis and respiratory and renal failure in early 2018.Marland Kitchen  Last dose of Rituxan May 2018.  Since then on daily prednisone and Bactrim prophylaxis.  Last B-cell flow cytometry January 2019 showing suppression of B cells but in summer 2019 emergence with 3% B cells associated with rising MPO tites in renal clinic summer 2019. Rx for relapose (based on b cells and creat rise) 1000mg  IV rituxan - May 08, 2018. Prednison not in [facility]  Sept 2019, Jan 2020 but she is on bactrim for prophylaxis   09/23/2018 -   Chief Complaint  Patient presents with   Follow-up    Pt states she has been doing well since last visit and denies any complaints.      HPI Catherine Aguilar 67 y.o. -presents for follow-up of the above issue.  She presents by herself.  As usual she is a poor historian.  This is evidenced by the fact that she does not know that she suffers from vasculitis.  She has no idea what this time even means.  She has no idea that she received Rituxan although she recollects receiving infusion.  Overall she tells me that she is well except for mild chronic cough.  She continues to smoke.  Her last Rituxan infusion was in August 2019.  Then in November 2019 she had a CT scan of the chest that shows new findings of coronary artery calcification.  In addition she is noted to have emphysema.  No obvious evidence of any Wegener's vasculitis.  She is up-to-date with her respiratory vaccines.  She continues on Bactrim prophylaxis but is not on any inhaler therapy other than pro-air.  She is not sure about quitting smoking.    OV 05/07/2019  Subjective:  Patient ID: Catherine Aguilar, female , DOB: 07/16/1956 , age 44 y.o. , MRN: [id number] , ADDRESS: [address] [place] [address]   05/07/2019 -   Chief Complaint  Patient presents with   Systemic Vaculitis Syndrome    No changes in breathing since last visit in January 2020. Medication usage still the same.     67 year old female with systemic vasculitis syndrome with life-threatening hemoptysis and respiratory and renal failure in early 2018.s/p induction Rituxan ending  May 2018.  Tthen on daily prednisone and Bactrim prophylaxis.  Last B-cell flow cytometry January 2019 showing suppression of B cells but in summer 2019 emergence with 3% B cells associated with rising MPO tites in renal clinic summer 2019. Rx for relapose (based on b cells and creat rise) 1000mg  IV rituxan - May 08, 2018. Prednison not in [facility]  Sept 2019, Jan 2020 and Aug 2020 but she is on bactrim for prophylaxis  Follow-up vasculitis Follow-up emphysema Follow-up smoking  HPI Lateshia Wenk 67 y.o. -presents for  follow-up.  Last visit was in January 2020 at which time she was in clinical remission from a vasculitis.  I recommended cardiology follow-up for pulmonary artery calcification.  She had a myocardial perfusion test in February 2020 and this was normal.  She continues  to do well.  Her last Rituxan was in August 2019.  She is now worried that her vasculitis is back because she had one episode of epistaxis when she woke up in the middle of the night this was just brief when she blew her nose.  Other than that she is not had any other episode.  She not having any sinus issues.  No respiratory issues.  She says she is renal and her kidney numbers are actually doing better.  She has chronic kidney disease with a creatinine of over 2 in our health system.  She continues with Bactrim but she is not on prednisone.   For emphysema: Last visit I started on Spiriva but I do not see this:  For smoking: She continues to smoke cigarettes.     OV 02/16/2020  Subjective:  Patient ID: Catherine Aguilar, female , DOB: 07/17/1956 , age 5 y.o. , MRN: [id number] , ADDRESS: [address] [place] [address]   02/16/2020 -   Chief Complaint  Patient presents with   Follow-up    Pt states she has been doing okay since last visit. States she still becomes SOB and has been coughing a lot. Pt states she will occ cough up beige to light green phlegm.   Follow-up emphysema: On Spiriva Follow-up smoking: Quit summer 2020 Follow-up immunosuppressed status due to vasculitis: On Bactrim prophylaxis Follow-up anemia: On Aranesp monitored by renal. Follow-up vasculitis  - systemic vasculitis syndrome with life-threatening hemoptysis and respiratory and renal failure in early 2018.s/p induction Rituxan ending  May 2018.  Tthen on daily prednisone and Bactrim prophylaxis.  Last B-cell flow cytometry January 2019 showing suppression of B cells but in summer 2019 emergence with 3% B cells associated with rising MPO tites in renal  clinic summer 2019.   -  Rx for relapose (based on b cells and creat rise) 1000mg  IV rituxan - May 08, 2018.,  80 mg IV x1 October 21, 2019  -Off prednisone because she could not get a refill [Prednison not in [facility]  Sept 2019, Jan 2020 and Aug 2020 and June 2021)  -  On bactrim for prophylaxis  HPI Catherine Aguilar 67 y.o. -returns for follow-up.  Last seen in the summer 2020.  Reviewed by myself.  In the interim she tells me that she has quit smoking.  She says dyspnea stable.  Present on exertion relieved by rest.  No hemoptysis no vasculitis symptoms.  She maintains herself on Spiriva.  Initially she said her last Rituxan was in September 2020.  I question the several times to her and she kept repeating it was September 2020.  Later we reviewed the records from Dr. Bufford Buttner her nephrologist and her last Rituxan was in February 2021.  Moving forward I believe Dr. Signe Colt wants me to be in charge of the Rituxan.  Earlier this year she had sinusitis symptoms with bloody nasal clots and shortness of breath and wheezing.  She was treated with conventional antibiotics and prednisone by our nurse practitioner.  Dr. Signe Colt did give 1 dose of Rituxan 800 mg on October 21, 2019.  Since this Rituxan dosage her sinus symptoms and her respiratory symptoms have improved.  In April 2020 when she had MPO antibody tested by Dr. Signe Colt and this was elevated.     OV 06/07/2020   Subjective:  Patient ID: Catherine Aguilar, female , DOB: 1956/08/30, age 78 y.o. years. , MRN: [id number],  ADDRESS: [address] [place] [address] PCP  Dollene Cleveland, DO Providers : Treatment Team:  Attending Provider: Kalman Shan, MD   Chief Complaint  Patient presents with   Follow-up    no complaints     Follow-up emphysema: On Spiriva Follow-up smoking: Quit summer 2020 Followup MJ smoking: admitted this to Sept 2021 (+ve urine march2018) Follow-up immunosuppressed status due to vasculitis: On Bactrim  prophylaxis Follow-up anemia: On Aranesp monitored by renal. Follow-up vasculitis  - systemic vasculitis syndrome with life-threatening hemoptysis and respiratory and renal failure in early 2018.s/p induction Rituxan ending  May 2018.  Tthen on daily prednisone and Bactrim prophylaxis.  Last B-cell flow cytometry January 2019 showing suppression of B cells but in summer 2019 emergence with 3% B cells associated with rising MPO tites in renal clinic summer 2019.   -  Rx for relapose (based on b cells and creat rise) 1000mg  IV rituxan - May 08, 2018.,  80 mg IV x1 October 21, 2019  -Off prednisone because she could not get a refill [Prednison not in [facility]  Sept 2019, Jan 2020 and Aug 2020 and June 2021 and sept 2021)  -  On bactrim for prophylaxis   HPI Catherine Aguilar 67 y.o. -returns for routine follow-up. In the interim she called in for an exacerbation and we gave her prednisone antibiotic. At this point in time she is not on chronic prednisone. She is on Spiriva. She feels well. No hemoptysis no respiratory complaints. She says she has had a Covid vaccine but is not documented. She says she had Pfizer. She also states that the season she has had her flu and pneumonia vaccines. She is not had a shingles vaccine. She does not realize that she is immunosuppressed and needs a Covid booster. She think she has to wait 6 months for Covid booster but it explained to her the literature about getting a booster on immunosuppression. Her last Rituxan was in Feb 2021. She is willing to get monitoring test labs. She is immunosuppressed.  CT Chest  IMPRESSION: 1. No compelling findings of interstitial lung disease at this time. 2. Small parenchymal bands scattered in the mid to lower lungs bilaterally, left greater than right, most compatible with mild nonspecific postinfectious/postinflammatory scarring. 3. Mild centrilobular emphysema with mild diffuse bronchial wall thickening, suggesting COPD. 4.  Stable mild cardiomegaly and small pericardial effusion. 5. Three-vessel coronary atherosclerosis.   Aortic Atherosclerosis (ICD10-I70.0) and Emphysema (ICD10-J43.9).     Electronically Signed   By: Delbert Phenix M.D.   On: 08/01/2018 13:  ROS - per HPI  Results for JERZEE, DANZY (MRN [id number]) as of 02/16/2020 09:19  Ref. Range 05/27/2019 08:46  December 10, 2019 done by renal  Myeloperoxidase Abs Latest Units: AI 2.3 (H)  50.5  Serine Protease 3 Latest Units: AI <1.0  less than 3.5  P-ANCA    1: 640  C ANCA    less than 1: 20   ROS - per HPI   Results for ALAYNA, SVEUM (MRN [id number]) as of 06/07/2020 13:38  Ref. Range 12/05/2017 11:15 01/02/2018 12:17 01/21/2018 10:54 01/30/2018 11:30 03/04/2018 10:31 04/03/2018 10:36 05/08/2018 09:16 05/26/2018 10:50 06/05/2018 10:52 07/03/2018 11:08 07/31/2018 11:30 08/28/2018 11:15 10/16/2018 10:56 11/13/2018 12:00 01/15/2019 10:46 02/12/2019 11:00 04/02/2019 12:07 05/07/2019 10:01 05/27/2019 08:46 05/28/2019 13:09 06/19/2019 12:21 07/31/2019 09:54 08/19/2019 15:17 09/15/2019 10:14 10/13/2019 10:30 11/10/2019 10:22 01/05/2020 10:12 02/02/2020 10:07 02/16/2020 09:19 03/21/2020 09:51 04/18/2020 08:34 05/30/2020 10:45  Creatinine Latest Ref Range: 0.40 - 1.20 mg/dL   6.43 (H)  2.98 (H)          2.66 (H) 3.29 (H)    2.40 (H)      2.76 (H)       OV 11/11/20 with APP Tammy Parerentt  Per Dr. Marchelle Gearing : Discussed with Dr. Signe Colt of renal.  She says the patient's kidney function is normal.  Blood labs are also okay.  Patient is feeling okay.  However the ANCA vasculitis antibody MPO was going up significantly.  It has been 1 year since patient's last dose of Rituxan.  She feels that the disease is going to get active.  She is recommending another dose of Rituxan within the next 1 month  Case discussed with Dr. Marchelle Gearing.  Dr. Marchelle Gearing spoke with nephrology.  Recent lab work from renal showed MPO antibody is trending up indicative of active vasculitis.  With recommendations to set up a  Rituxan infusion .  Advised to use Rituxan protocol. Reach out to pharmacy Desma Mcgregor for protocol questions.   We will set up as previously done last in February 2021. Renal notes from October 24, 2020 reviewed and showed MPO antibody greater than 100 P ANCA 1: 640 ESR 40 serum creatinine 2.67 GFR 21 hemoglobin 11.8  Patient will be set up for Rituxan  infusion per protocol with Rituxan 800 mg (375 mg/M2 ) IV x1. Predose with Tylenol and Benadryl . Continue on Bactrim DS for PJP proph. 3d/wk  Patient is to follow-up in 4 weeks with Dr. Marchelle Gearing in the office for a follow-up visit.    OV 01/30/2021  Subjective:  Patient ID: Catherine Aguilar, female , DOB: [birth year] , age 71 y.o. , MRN: [id number] , ADDRESS: [address] [place] [address] PCP Dollene Cleveland, DO Patient Care Team: Dollene Cleveland, DO as PCP - General (Family Medicine)  This Provider for this visit: Treatment Team:  Attending Provider: Kalman Shan, MD    01/30/2021 -   Chief Complaint  Patient presents with   Follow-up    Pt states she has been doing okay since last visit. States if she gets hot, she will have an uncontrollable cough and will occ cough up phlegm.    Follow-up emphysema: On Spiriva Follow-up smoking: Quit summer 2020 Followup MJ smoking: admitted this to Sept 2021 (+ve urine march2018) Follow-up immunosuppressed status due to vasculitis: On Bactrim prophylaxis Follow-up anemia: On Aranesp monitored by renal. Follow-up vasculitis  - systemic vasculitis syndrome with life-threatening hemoptysis and respiratory and renal failure in early 2018.s/p induction Rituxan ending  May 2018.  Tthen on daily prednisone and Bactrim prophylaxis.  Last B-cell flow cytometry January 2019 showing suppression of B cells but in summer 2019 emergence with 3% B cells associated with rising MPO tites in renal clinic summer 2019.   -  Rx for relapose (based on b cells and creat rise) 1000mg  IV rituxan -  May 08, 2018.,  80 mg IV x1 October 21, 2019 -> repeat March 2022  -Off prednisone because she could not get a refill [Prednison not in [facility]  Sept 2019, Jan 2020 and Aug 2020 and June 2021,  sept 2021, may 2022)  -  On bactrim for prophylaxis   Quantiferon gold - negtive Sept 2021  HPI Catherine Aguilar 68 y.o. -returns for follow-up.  Last seen by myself in September 2021.  After that she was seen by nurse practitioner in March 2022.  The spring 2022 visit was following nephrology concerns of the vasculitis was reemerging.  Patient  was able to see nurse practitioner and we set her up for Rituxan infusion.  She says she has gotten the Rituxan infusion mid March 2022.  After that her sinus symptoms have improved a lot.  She says she feels good except when she exerts the summer heat gets hot then she starts coughing.  She continues to smoke marijuana.  She is refusing to quit.  She uses albuterol as needed.  She is not using her Spiriva on a regular basis.  She is unclear why she is not using Spiriva on a regular basis.  She says she has a lot of inhalers at home and she does not know them other than albuterol.  She does not recollect the fact she has been prescribed Spiriva in the past and instructed to take it on a regular basis.  She is willing to give this a try.  There are no other new issues.  Overall health status is stable.  In terms of vaccination: She says she has had of 4 shots of COVID-vaccine.  Unclear if she got monoclonal antibody prophylaxis.  Last time when she was here we checked her COVID IgG and it was negative and I recommended monoclonal antibody prophylaxis but she does not remember this.  She is willing to have a COVID IgG checked.  Also unclear if she has had a shingles vaccine.  Gave instructions about it last time.      OV 06/01/2021  Subjective:  Patient ID: Catherine Aguilar, female , DOB: Feb 08, 1956 , age 57 y.o. , MRN: [id number] , ADDRESS: [address] [place]  [address] PCP Reece Leader, DO Patient Care Team: Reece Leader, DO as PCP - General (Family Medicine)  This Provider for this visit: Treatment Team:  Attending Provider: Kalman Shan, MD    06/01/2021 -   Chief Complaint  Patient presents with   Follow-up    Pt states she has been doing okay since last visit and denies any complaints.     HPI Catherine Aguilar 67 y.o. -returns for follow-up 6 months.  She is doing well.  She occasionally uses marijuana because it helps sensory symptoms in her right lower extremity.  Otherwise she is feeling well no complaints.  She has not had a COVID booster.  She will have a high-dose flu shot today.  She believes she gets Rituxan once a year.  Last one was in spring 2022.  At this point in time no hemoptysis or dysuria or hematuria.  No arthritis no rashes.  Last set of labs was in spring 2022.  She does not feel inclination to quit marijuana.  She continues with Spiriva for the emphysema.  OV 07/26/2023  Subjective:  Patient ID: Catherine Aguilar, female , DOB: Aug 09, 1956 , age 67 y.o. , MRN: [id number] , ADDRESS: [address] [place] [address] PCP Levin Erp, MD Patient Care Team: Levin Erp, MD as PCP - General (Family Medicine)  This Provider for this visit: Treatment Team:  Attending Provider: Kalman Shan, MD    07/26/2023 -   Chief Complaint  Patient presents with   Follow-up    Breathing is unchanged since the last visit. She has occ coughing spell- non prod.     Follow-up emphysema: On Spiriva Follow-up smoking: Quit summer 2020 Followup MJ smoking: admitted this to Sept 2021 and may 2022 (+ve urine march2018) Follow-up immunosuppressed status due to vasculitis: On Bactrim prophylaxis Follow-up anemia: On Aranesp monitored by renal. Follow-up vasculitis  - systemic vasculitis syndrome with  life-threatening hemoptysis and respiratory and renal failure in early 2018.s/p induction Rituxan ending  May  2018.  Tthen on daily prednisone and Bactrim prophylaxis.  Last B-cell flow cytometry January 2019 showing suppression of B cells but in summer 2019 emergence with 3% B cells associated with rising MPO tites in renal clinic summer 2019.   -  Rx for relapose (based on b cells and creat rise) 1000mg  IV rituxan - May 08, 2018.,  80 mg IV x1 October 21, 2019 -> repeat March 2022  -Off prednisone because she could not get a refill [Prednison not in [facility]  Sept 2019, Jan 2020 and Aug 2020 and June 2021,  sept 2021, may 2022), late 2023 by DR Signe Colt per her history   -  On bactrim for prophylaxis  - > stopped 07/26/23 after d/w Dr Signe Colt   Quantiferon gold - negtive Sept 2021  HPI Catherine Aguilar 67 y.o. -returns for follow-up.  Last seen 2 years ago in September 2022.  She had called the office because she wanted her Bactrim refilled.  And because it is been 2 years she was scheduled.  She tells me overall her health status has been stable and there have been no new medical problems.  No admissions no ER visits no urgent care visits no surgeries no medication changes.  But review of the external medical record indicates this is all incorrect.  She continues to have significant anemia.  In addition in the summer of this year creatinine went up to 7 mg percent.  She then had a renal biopsy in September 2024 that did not show any active vasculitis but showed fibrosis.  I discussed with Dr. Bufford Buttner her nephrologist on the phone.  She says there is no active vasculitis but patient has progressed to CKD and is heading towards end-stage renal disease.  And ultimately dialysis at some point.  Patient continues to take Bactrim.  I discussed with Dr. Signe Colt the last Rituxan was late 2023.  Patient also concerned this by history.  We both collectively felt there is no need for Bactrim given the fact it has been over a year since Rituxan.  Have communicated this with the patient.  In terms of her respiratory status  she continues to take Spiriva she is denying any shortness of breath cough or wheezing but she continues to smoke marijuana on an as-needed basis.  When asked if as needed this every day she did not deny this.  Last CT scan of the chest was in 2019.      PFT      No data to display            LAB RESULTS last 96 hours No results found.  LAB RESULTS last 90 days Recent Results (from the past 2160 hour(s))  Ferritin     Status: None   Collection Time: 05/08/23 12:13 PM  Result Value Ref Range   Ferritin 140 11 - 307 ng/mL    Comment: Performed at [facility], [address], [place], [place] [address]  Iron and TIBC     Status: None   Collection Time: 05/08/23 12:13 PM  Result Value Ref Range   Iron 57 28 - 170 ug/dL   TIBC 604 540 - 981 ug/dL   Saturation Ratios 17 10.4 - 31.8 %   UIBC 280 ug/dL    Comment: Performed at [facility], [address] [place], [place] [address]  Hemoglobin-hemacue, POC  Status: Abnormal   Collection Time: 05/08/23 12:17 PM  Result Value Ref Range   Hemoglobin 10.6 (L) 12.0 - 15.0 g/dL  CBC upon arrival     Status: Abnormal   Collection Time: 05/17/23  7:23 AM  Result Value Ref Range   WBC 6.3 4.0 - 10.5 K/uL   RBC 3.86 (L) 3.87 - 5.11 MIL/uL   Hemoglobin 10.1 (L) 12.0 - 15.0 g/dL   HCT 13.0 (L) 86.5 - 78.4 %   MCV 84.5 80.0 - 100.0 fL   MCH 26.2 26.0 - 34.0 pg   MCHC 31.0 30.0 - 36.0 g/dL   RDW 69.6 (H) 29.5 - 28.4 %   Platelets 232 150 - 400 K/uL   nRBC 0.0 0.0 - 0.2 %    Comment: Performed at [facility], [address] [place], [place] [address]  Protime-INR upon arrival     Status: None   Collection Time: 05/17/23  7:23 AM  Result Value Ref Range   Prothrombin Time 12.7 11.4 - 15.2 seconds   INR 0.9 0.8 - 1.2    Comment: (NOTE) INR goal varies based on device and disease states. Performed at [facility], [address], [place], [place] [address]   Glucose, capillary     Status: Abnormal    Collection Time: 05/17/23  7:49 AM  Result Value Ref Range   Glucose-Capillary 117 (H) 70 - 99 mg/dL    Comment: Glucose reference range applies only to samples taken after fasting for at least 8 hours.  Surgical pathology     Status: None   Collection Time: 05/17/23  9:36 AM  Result Value Ref Range   SURGICAL PATHOLOGY      SURGICAL PATHOLOGY CASE: [id number] PATIENT: Catherine Aguilar Surgical Pathology Report     Clinical History: worsening CKI (cm)     FINAL MICROSCOPIC DIAGNOSIS:  A. KIDNEY, LEFT LOWER POLE, NEEDLE CORE BIOPSY: - See OUTSIDE PATHOLOGY REPORT under RESULTS REVIEW or the LABS TAB under "[facility] KIDNEY BIOPSY SEND OUT" in [facility].   GROSS DESCRIPTION:  Received in saline are 2 cores of tan-pink soft tissue, each 1.5 cm in length and 0.1 cm in diameter. One is submitted in formalin for light microscopy and possible EM studies, and remaining core is submitted in Michel's media for immunofluorescence studies.  The entire specimen is sent to [facility] for testing.  SW 05/17/2023  Final Diagnosis performed by Orene Desanctis DO.   Electronically signed 05/28/2023 Technical component performed at [employer], [address] [place], [place] [address].  Professional component performed at [facility], [address] [place], [place] [address].  Immunohistochemistry Technical component (if applicable) was performed at [facility]. [address], [place], [place] [address].   IMMUNOHISTOCHEMISTRY DISCLAIMER (if applicable): Some of these immunohistochemical stains may have been developed and the performance characteristics determine by [facility]. Some may not have been cleared or approved by the U.S. Food and Drug Administration. The FDA has determined that such clearance or approval is not necessary. This test is used for clinical purposes. It should not be regarded  as investigational or for research. This laboratory is certified under the Clinical Laboratory Improvement Amendments of 1988 (CLIA-88) as qualified to perform high complexity clinical laboratory testing.  The controls stained appropriately.   IHC stains are performed on formalin fixed, paraffin embedded tissue using a 3,3"diaminobenzidine (DAB) chromogen and Leica Bond Autostainer  System. The  staining intensity of the nucleus is score manually and is reported as the percentage of tumor cell nuclei demonstrating specific nuclear staining. The specimens are fixed in 10% Neutral Formalin for at least 6 hours and up to 72hrs. These tests are validated on decalcified tissue. Results should be interpreted with caution given the possibility of false negative results on decalcified specimens. Antibody Clones are as follows ER-clone 90F, PR-clone 16, Ki67- clone MM1. Some of these immunohistochemical stains may have been developed and the performance characteristics determined by [facility].   Ferritin     Status: None   Collection Time: 06/05/23 11:58 AM  Result Value Ref Range   Ferritin 118 11 - 307 ng/mL    Comment: Performed at [facility], [address], [place], [place] [address]  Iron and TIBC     Status: None   Collection Time: 06/05/23 11:58 AM  Result Value Ref Range   Iron 58 28 - 170 ug/dL   TIBC 604 540 - 981 ug/dL   Saturation Ratios 17 10.4 - 31.8 %   UIBC 284 ug/dL    Comment: Performed at [facility], [address], [place], [place] [address]  Hemoglobin-hemacue, POC     Status: Abnormal   Collection Time: 06/05/23 12:00 PM  Result Value Ref Range   Hemoglobin 10.0 (L) 12.0 - 15.0 g/dL  HM DIABETES EYE EXAM     Status: None   Collection Time: 06/26/23  8:48 AM  Result Value Ref Range   HM Diabetic Eye Exam No Retinopathy No Retinopathy  Ferritin     Status: None   Collection Time: 07/03/23 12:19 PM  Result Value Ref Range   Ferritin 145 11 -  307 ng/mL    Comment: Performed at [facility], [address] [place], [place] [address]  Iron and TIBC     Status: None   Collection Time: 07/03/23 12:19 PM  Result Value Ref Range   Iron 69 28 - 170 ug/dL   TIBC 213 086 - 578 ug/dL   Saturation Ratios 19 10.4 - 31.8 %   UIBC 292 ug/dL    Comment: Performed at [facility], [address], [place], [place] [address]  Hemoglobin-hemacue, POC     Status: Abnormal   Collection Time: 07/03/23 12:20 PM  Result Value Ref Range   Hemoglobin 9.3 (L) 12.0 - 15.0 g/dL         has a past medical history of Acute renal failure (ARF) (HCC) (12/08/2016), Acute respiratory failure with hypoxia (HCC) (12/08/2016), AKI (acute kidney injury) (HCC) (12/08/2016), Allergy, Anemia, Asthma with COPD (chronic obstructive pulmonary disease) (HCC) (11/25/2017), CAP (community acquired pneumonia) (12/08/2016), Chronic kidney disease, CKD (chronic kidney disease), stage IV (HCC) (02/11/2017), 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), CVA (cerebral vascular accident) (HCC) (07/03/2012), Diffuse pulmonary alveolar hemorrhage, Edema of right lower extremity (03/25/2017), Essential hypertension (12/08/2016), Hemoptysis (12/08/2016), High cholesterol, HLD (hyperlipidemia) (12/08/2016), adenomatous polyp of colon (04/16/2017), Hypertension, Hypokalemia (07/02/2012), 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   reports that she quit smoking about 4 years ago. Her smoking use included cigarettes. She started smoking about 54 years ago. She has a 10.1 pack-year smoking history. She has never used  smokeless tobacco.  Past Surgical History:  Procedure Laterality Date   ABDOMINAL HYSTERECTOMY  1985    No Known Allergies  Immunization History  Administered Date(s) Administered   Fluad Quad(high Dose 65+) 06/01/2021   Fluad Trivalent(High Dose 65+) 07/26/2023   Influenza Split 07/02/2012   Influenza,inj,Quad PF,6+ Mos 05/03/2017, 05/13/2018, 06/08/2019   PFIZER Comirnaty(Gray Top)Covid-19 Tri-Sucrose Vaccine 03/05/2020, 03/26/2020, 10/10/2020, 01/23/2021   Pfizer Covid-19 Vaccine Bivalent Booster 73yrs & up 06/26/2021   Pneumococcal Polysaccharide-23 07/02/2012, 11/22/2017, 05/30/2020    Family History  Problem Relation Age of Onset   Heart attack Mother    Diabetes Mother    Hypertension Mother    Stroke Mother    Cancer Father    Alcohol abuse Father    Diabetes Father    Hypertension Father    Breast cancer Neg Hx    Colon cancer Neg Hx    Esophageal cancer Neg Hx    Rectal cancer Neg Hx    Stomach cancer Neg Hx    Pancreatic cancer Neg Hx    BRCA 1/2 Neg Hx      Current Outpatient Medications:    albuterol (PROVENTIL) (2.5 MG/3ML) 0.083% nebulizer solution, Take 3 mLs (2.5 mg total) by nebulization every 6 (six) hours as needed for wheezing or shortness of breath., Disp: 150 mL, Rfl: 1   albuterol (VENTOLIN HFA) 108 (90 Base) MCG/ACT inhaler, INHALE 2 PUFFS BY MOUTH EVERY 4-6 HOURS, Disp: 8.5 each, Rfl: 2   aspirin 81 MG EC tablet, TAKE 1 TABLET BY MOUTH EVERY DAY (Patient taking differently: Take 81 mg by mouth daily.), Disp: 32 tablet, Rfl: 12   cetirizine (ZYRTEC) 10 MG tablet, Take 1 tablet (10 mg total) by mouth daily., Disp: 90 tablet, Rfl: 2   fluticasone (FLONASE) 50 MCG/ACT nasal spray, Place 2 sprays into both nostrils daily., Disp: 16 g, Rfl: 2   glucose blood (ACCU-CHEK GUIDE) test strip, Check blood glucose prior to lantus administration, Disp: 100 each, Rfl: 12   insulin glargine (LANTUS SOLOSTAR) 100 UNIT/ML Solostar Pen, Inject 5 Units into the  skin daily., Disp: 15 mL, Rfl: 1   Insulin Pen Needle 31G X 5 MM MISC, 1 each by Does not apply route daily., Disp: 100 each, Rfl: 0   Lancets Misc. (ACCU-CHEK SOFTCLIX LANCET DEV) KIT, Check BG prior to administering insulin, Disp: 1 kit, Rfl: 3   linaclotide (LINZESS) 72 MCG capsule, TAKE 1 CAPSULE (72 MCG TOTAL) BY MOUTH DAILY BEFORE BREAKFAST. Strength: 72 mcg TAKE 1 CAPSULE (72 MCG TOTAL) BY MOUTH DAILY BEFORE BREAKFAST. Strength: 72 mcg, Disp: 30 capsule, Rfl: 0   pravastatin (PRAVACHOL) 40 MG tablet, TAKE 1 TABLET BY MOUTH EVERY DAY AT BEDTIME FOR CHOLESTEROL, Disp: 90 tablet, Rfl: 3   sodium bicarbonate 650 MG tablet, Take 2 tablets (1,300 mg total) by mouth 2 (two) times daily., Disp: 60 tablet, Rfl: 1   sulfamethoxazole-trimethoprim (BACTRIM DS) 800-160 MG tablet, TAKE 1 TABLET BY MOUTH 3 (THREE) TIMES A WEEK. TAKE 1 TABLET BY MOUTH THREE TIMES A WEEK., Disp: 6 tablet, Rfl: 0   Tiotropium Bromide Monohydrate (SPIRIVA RESPIMAT) 1.25 MCG/ACT AERS, Inhale 2 puffs into the lungs daily., Disp: 4 g, Rfl: 6      Objective:   Vitals:   07/26/23 0852  BP: 126/80  Pulse: (!) 51  SpO2: 96%  Weight: 213 lb 12.8 oz (97 kg)  Height: 5\' 7"  (1.702 m)    Estimated body mass index is 33.49 kg/m as calculated from the following:   Height  as of this encounter: 5\' 7"  (1.702 m).   Weight as of this encounter: 213 lb 12.8 oz (97 kg).  @WEIGHTCHANGE @  Filed Weights   07/26/23 0852  Weight: 213 lb 12.8 oz (97 kg)     Physical Exam   General: No distress. Looks well O2 at rest: no Cane present: no Sitting in wheel chair: no Frail: no Obese: no Neuro: Alert and Oriented x 3. GCS 15. Speech normal Psych: Pleasant Resp:  Barrel Chest - no.  Wheeze - no, Crackles - no, No overt respiratory distress CVS: Normal heart sounds. Murmurs - no Ext: Stigmata of Connective Tissue Disease - no HEENT: Normal upper airway. PEERL +. No post nasal drip        Assessment:       ICD-10-CM   1.  Wegener's granulomatosis with renal involvement (HCC)  M31.31 CBC w/Diff    Basic Metabolic Panel (BMET)    Hepatic function panel    CT Chest High Resolution    2. Need for influenza vaccination  Z23 Flu Vaccine Trivalent High Dose (Fluad)    3. CKD (chronic kidney disease), stage IV (HCC)  N18.4     4. Pulmonary emphysema, unspecified emphysema type (HCC)  J43.9     5. Marijuana abuse  F12.10     6. Visit for monitoring Rituxan therapy  Z51.81    Z79.620          Plan:     Patient Instructions  VAsculitis Immunosuppressed status Sp Rituxan repeat March 2022  versus sometime in 2023 (per your history)  Clinically vasculitis in remission as of 07/26/2023 but maybe not given rcent rise in creatinine  Dyspne is mild and stable and likely due to weight, deconditioning and mild associated emphysema  Plan  - check bmet, lft, cbc 07/26/2023 - get HRCT next few weeks or prior to next month - will call with results - stop Bactrim  because it has been > 1 year since Rituxan  Emphysema   - stable  plan - continue spiriva respimat daily -albuterolas needed  CKD -   Per Dr Signe Colt 07/26/2023 -    Marijuana use  - this is hurting your lung in long run thought seems to help sensation in leg  plan  - please quit aSAP  Followup  -6 months or sooner If neede; 15 min slot   FOLLOWUP Return in about 3 months (around 10/26/2023) for 15 min visit, Face to Face Visit, after HRCT chest, with Dr Marchelle Gearing.    SIGNATURE    Dr. Kalman Shan, M.D., F.C.C.P,  Pulmonary and Critical Care Medicine Staff Physician, [facility] Center Director - Interstitial Lung Disease  Program  Pulmonary Fibrosis [facility] at [facility] [place], [place], [address]  Pager: [phone], If no answer or between  15:00h - 7:00h: call [phone] Telephone: [phone]  9:32 AM 07/26/2023  HIGh Complexity  OFFICE   2021 E/M guidelines, first  released in 2021, with minor revisions added in 2023. Must meet the requirements for 2 out of 3 dimensions to qualify.    Number and complexity of problems addressed Amount and/or complexity of data reviewed Risk of complications and/or morbidity  Severe exacerbation of chronic illness  Acute or chronic illnesses that may pose a threat to life or bodily function, e.g., multiple trauma, acute MI, pulmonary embolus, severe respiratory distress, progressive rheumatoid arthritis, psychiatric illness with potential threat to self or others, peritonitis,  acute renal failure, abrupt change in neurological status Must meet the requirements for 2 of 3 of the categories)  Category 1: Tests and documents, historian  Any combination of 3 of the following:  Assessment requiring an independent historian  Review of prior external note(s) from each unique source  Review of results of each unique test  Ordering of each unique test    Category 2: Interpretation of tests    Independent interpretation of a test performed by another physician/other qualified health care professional (not separately reported)  Category 3: Discuss management/tests  Discussion of management or test interpretation with external physician/other qualified health care professional/appropriate source (not separately reported) - Dr Signe Colt  HIGH risk of morbidity from additional diagnostic testing or treatment Examples only:  Drug therapy requiring intensive monitoring for toxicity - RITUXAN  Decision for elective major surgery with identified pateint or procedure risk factors  Decision regarding hospitalization or escalation of level of care  Decision for DNR or to de-escalate care   Parenteral controlled  substances

## 2023-07-26 NOTE — Patient Instructions (Addendum)
VAsculitis Immunosuppressed status Sp Rituxan repeat March 2022  versus sometime in 2023 (per your history)  Clinically vasculitis in remission as of 07/26/2023 but maybe not given rcent rise in creatinine  Dyspne is mild and stable and likely due to weight, deconditioning and mild associated emphysema  Plan  - check bmet, lft, cbc 07/26/2023 - get HRCT next few weeks or prior to next month - will call with results - stop Bactrim  because it has been > 1 year since Rituxan  Emphysema   - stable  plan - continue spiriva respimat daily -albuterolas needed  CKD -   Per Dr Signe Colt 07/26/2023 -    Marijuana use  - this is hurting your lung in long run thought seems to help sensation in leg  plan  - please quit aSAP  Followup  -6 months or sooner If neede; 15 min slot

## 2023-07-31 ENCOUNTER — Ambulatory Visit (HOSPITAL_COMMUNITY)
Admission: RE | Admit: 2023-07-31 | Discharge: 2023-07-31 | Disposition: A | Discharge status: Home / Self Care | Payer: 59 | Source: Ambulatory Visit | Attending: Nephrology | Admitting: Nephrology

## 2023-07-31 VITALS — BP 168/80 | HR 56 | Temp 97.5°F | Resp 18

## 2023-07-31 DIAGNOSIS — N184 Chronic kidney disease, stage 4 (severe): Secondary | ICD-10-CM | POA: Insufficient documentation

## 2023-07-31 DIAGNOSIS — N170 Acute kidney failure with tubular necrosis: Secondary | ICD-10-CM | POA: Diagnosis not present

## 2023-07-31 DIAGNOSIS — Z79890 Hormone replacement therapy (postmenopausal): Secondary | ICD-10-CM | POA: Insufficient documentation

## 2023-07-31 DIAGNOSIS — D631 Anemia in chronic kidney disease: Secondary | ICD-10-CM | POA: Insufficient documentation

## 2023-07-31 LAB — IRON AND TIBC
Iron: 80 ug/dL (ref 28–170)
Saturation Ratios: 23 % (ref 10.4–31.8)
TIBC: 349 ug/dL (ref 250–450)
UIBC: 269 ug/dL

## 2023-07-31 LAB — POCT HEMOGLOBIN-HEMACUE: Hemoglobin: 10.1 g/dL — ABNORMAL LOW (ref 12.0–15.0)

## 2023-07-31 LAB — FERRITIN: Ferritin: 132 ng/mL (ref 11–307)

## 2023-07-31 MED ORDER — DARBEPOETIN ALFA 100 MCG/0.5ML IJ SOSY
PREFILLED_SYRINGE | INTRAMUSCULAR | Status: AC
  Administered 2023-07-31: 100 ug via SUBCUTANEOUS
  Filled 2023-07-31: qty 0.5

## 2023-07-31 MED ORDER — DARBEPOETIN ALFA 100 MCG/0.5ML IJ SOSY
100.0000 ug | PREFILLED_SYRINGE | INTRAMUSCULAR | Status: DC
Start: 2023-07-31 — End: 2024-08-26

## 2023-08-01 ENCOUNTER — Telehealth: Payer: Self-pay | Admitting: Internal Medicine

## 2023-08-01 DIAGNOSIS — I639 Cerebral infarction, unspecified: Secondary | ICD-10-CM | POA: Diagnosis not present

## 2023-08-01 DIAGNOSIS — E785 Hyperlipidemia, unspecified: Secondary | ICD-10-CM | POA: Diagnosis not present

## 2023-08-01 DIAGNOSIS — E1122 Type 2 diabetes mellitus with diabetic chronic kidney disease: Secondary | ICD-10-CM | POA: Diagnosis not present

## 2023-08-01 DIAGNOSIS — M3131 Wegener's granulomatosis with renal involvement: Secondary | ICD-10-CM | POA: Diagnosis not present

## 2023-08-01 DIAGNOSIS — I120 Hypertensive chronic kidney disease with stage 5 chronic kidney disease or end stage renal disease: Secondary | ICD-10-CM | POA: Diagnosis not present

## 2023-08-01 DIAGNOSIS — D631 Anemia in chronic kidney disease: Secondary | ICD-10-CM | POA: Diagnosis not present

## 2023-08-01 DIAGNOSIS — N185 Chronic kidney disease, stage 5: Secondary | ICD-10-CM | POA: Diagnosis not present

## 2023-08-01 NOTE — Telephone Encounter (Signed)
Catherine Aguilar  Any issues taking Bactrim 1 double strength 3 times a week with a creatinine of 8 mg percent?

## 2023-08-01 NOTE — Progress Notes (Signed)
She needs tot alk to Dr Signe Colt about her rising creatinine

## 2023-08-05 ENCOUNTER — Other Ambulatory Visit: Payer: Self-pay

## 2023-08-05 ENCOUNTER — Telehealth: Payer: Self-pay

## 2023-08-05 MED ORDER — PRAVASTATIN SODIUM 40 MG PO TABS: ORAL_TABLET | ORAL | 3 refills | Status: DC

## 2023-08-05 NOTE — Telephone Encounter (Signed)
-----   Message from [facility] sent at 08/01/2023  5:20 PM EST ----- She needs tot alk to Dr Signe Colt about her rising creatinine

## 2023-08-05 NOTE — Telephone Encounter (Signed)
Left message advising patient to follow up with Dr Signe Colt per Dr Marchelle Gearing note.

## 2023-08-06 NOTE — Telephone Encounter (Signed)
Based on current guidelines it is recommended to dose reduce patients with CrCl <15 by 25-50%. In this case this could be Bactrim 1 SS tablet three times weekly.   Sofie Rower, PharmD [facility] PGY-1

## 2023-08-06 NOTE — Telephone Encounter (Signed)
Traiage   Pls reduce bactrim dosage as below

## 2023-08-07 NOTE — Telephone Encounter (Signed)
Per chart Bactrim already dosed with these directions.   sulfamethoxazole-trimethoprim (BACTRIM DS) 800-160 MG tablet  Sig: TAKE 1 TABLET BY MOUTH 3 (THREE) TIMES A WEEK.

## 2023-08-15 ENCOUNTER — Other Ambulatory Visit: Payer: 59

## 2023-08-28 ENCOUNTER — Ambulatory Visit (HOSPITAL_COMMUNITY)
Admission: RE | Admit: 2023-08-28 | Discharge: 2023-08-28 | Disposition: A | Discharge status: Home / Self Care | Payer: 59 | Source: Ambulatory Visit | Attending: Nephrology | Admitting: Nephrology

## 2023-08-28 VITALS — BP 162/78 | HR 57 | Temp 97.5°F | Resp 18

## 2023-08-28 DIAGNOSIS — Z79890 Hormone replacement therapy (postmenopausal): Secondary | ICD-10-CM | POA: Diagnosis not present

## 2023-08-28 DIAGNOSIS — N185 Chronic kidney disease, stage 5: Secondary | ICD-10-CM | POA: Insufficient documentation

## 2023-08-28 DIAGNOSIS — N170 Acute kidney failure with tubular necrosis: Secondary | ICD-10-CM | POA: Insufficient documentation

## 2023-08-28 DIAGNOSIS — D631 Anemia in chronic kidney disease: Secondary | ICD-10-CM | POA: Diagnosis not present

## 2023-08-28 LAB — FERRITIN: Ferritin: 127 ng/mL (ref 11–307)

## 2023-08-28 LAB — IRON AND TIBC
Iron: 59 ug/dL (ref 28–170)
Saturation Ratios: 17 % (ref 10.4–31.8)
TIBC: 347 ug/dL (ref 250–450)
UIBC: 288 ug/dL

## 2023-08-28 LAB — POCT HEMOGLOBIN-HEMACUE: Hemoglobin: 9.5 g/dL — ABNORMAL LOW (ref 12.0–15.0)

## 2023-08-28 MED ORDER — DARBEPOETIN ALFA 100 MCG/0.5ML IJ SOSY
100.0000 ug | PREFILLED_SYRINGE | INTRAMUSCULAR | Status: DC
Start: 2023-08-28 — End: 2024-09-23
  Administered 2023-08-28: 100 ug via SUBCUTANEOUS

## 2023-08-28 MED ORDER — DARBEPOETIN ALFA 100 MCG/0.5ML IJ SOSY
PREFILLED_SYRINGE | INTRAMUSCULAR | Status: AC
  Filled 2023-08-28: qty 0.5

## 2023-09-25 ENCOUNTER — Encounter (HOSPITAL_COMMUNITY): Payer: Self-pay | Admitting: *Deleted

## 2023-09-25 ENCOUNTER — Ambulatory Visit (HOSPITAL_COMMUNITY)
Admission: RE | Admit: 2023-09-25 | Discharge: 2023-09-25 | Disposition: A | Discharge status: Home / Self Care | Payer: 59 | Source: Ambulatory Visit | Attending: Nephrology | Admitting: Nephrology

## 2023-09-25 VITALS — BP 159/79 | HR 55 | Temp 97.1°F | Resp 18

## 2023-09-25 DIAGNOSIS — Z79890 Hormone replacement therapy (postmenopausal): Secondary | ICD-10-CM | POA: Insufficient documentation

## 2023-09-25 DIAGNOSIS — D631 Anemia in chronic kidney disease: Secondary | ICD-10-CM | POA: Diagnosis not present

## 2023-09-25 DIAGNOSIS — N185 Chronic kidney disease, stage 5: Secondary | ICD-10-CM | POA: Diagnosis not present

## 2023-09-25 DIAGNOSIS — N170 Acute kidney failure with tubular necrosis: Secondary | ICD-10-CM | POA: Diagnosis not present

## 2023-09-25 LAB — IRON AND TIBC
Iron: 75 ug/dL (ref 28–170)
Saturation Ratios: 21 % (ref 10.4–31.8)
TIBC: 351 ug/dL (ref 250–450)
UIBC: 276 ug/dL

## 2023-09-25 LAB — POCT HEMOGLOBIN-HEMACUE: Hemoglobin: 9.8 g/dL — ABNORMAL LOW (ref 12.0–15.0)

## 2023-09-25 LAB — FERRITIN: Ferritin: 141 ng/mL (ref 11–307)

## 2023-09-25 MED ORDER — DARBEPOETIN ALFA 100 MCG/0.5ML IJ SOSY
100.0000 ug | PREFILLED_SYRINGE | INTRAMUSCULAR | Status: DC
  Administered 2023-09-25: 100 ug via SUBCUTANEOUS

## 2023-09-25 MED ORDER — DARBEPOETIN ALFA 100 MCG/0.5ML IJ SOSY
PREFILLED_SYRINGE | INTRAMUSCULAR | Status: AC
  Filled 2023-09-25: qty 0.5

## 2023-10-16 ENCOUNTER — Encounter (HOSPITAL_COMMUNITY): Payer: Self-pay | Admitting: *Deleted

## 2023-10-23 ENCOUNTER — Ambulatory Visit (HOSPITAL_COMMUNITY)
Admission: RE | Admit: 2023-10-23 | Discharge: 2023-10-23 | Disposition: A | Discharge status: Home / Self Care | Payer: 59 | Source: Ambulatory Visit | Attending: Nephrology | Admitting: Nephrology

## 2023-10-23 VITALS — BP 162/80 | HR 60 | Temp 97.9°F | Resp 18

## 2023-10-23 DIAGNOSIS — Z79890 Hormone replacement therapy (postmenopausal): Secondary | ICD-10-CM | POA: Insufficient documentation

## 2023-10-23 DIAGNOSIS — D631 Anemia in chronic kidney disease: Secondary | ICD-10-CM | POA: Insufficient documentation

## 2023-10-23 DIAGNOSIS — N184 Chronic kidney disease, stage 4 (severe): Secondary | ICD-10-CM | POA: Diagnosis not present

## 2023-10-23 DIAGNOSIS — N170 Acute kidney failure with tubular necrosis: Secondary | ICD-10-CM | POA: Diagnosis present

## 2023-10-23 LAB — FERRITIN: Ferritin: 135 ng/mL (ref 11–307)

## 2023-10-23 LAB — IRON AND TIBC
Iron: 56 ug/dL (ref 28–170)
Saturation Ratios: 16 % (ref 10.4–31.8)
TIBC: 343 ug/dL (ref 250–450)
UIBC: 287 ug/dL

## 2023-10-23 LAB — POCT HEMOGLOBIN-HEMACUE: Hemoglobin: 9.7 g/dL — ABNORMAL LOW (ref 12.0–15.0)

## 2023-10-23 MED ORDER — DARBEPOETIN ALFA 100 MCG/0.5ML IJ SOSY
PREFILLED_SYRINGE | INTRAMUSCULAR | Status: DC
Start: 2023-10-23 — End: 2023-10-24
  Filled 2023-10-23: qty 0.5

## 2023-10-23 MED ORDER — DARBEPOETIN ALFA 100 MCG/0.5ML IJ SOSY
100.0000 ug | PREFILLED_SYRINGE | INTRAMUSCULAR | Status: DC
  Administered 2023-10-23: 100 ug via SUBCUTANEOUS

## 2023-11-03 ENCOUNTER — Other Ambulatory Visit: Payer: Self-pay | Admitting: Student

## 2023-11-03 DIAGNOSIS — E119 Type 2 diabetes mellitus without complications: Secondary | ICD-10-CM

## 2023-11-20 ENCOUNTER — Ambulatory Visit (HOSPITAL_COMMUNITY)
Admission: RE | Admit: 2023-11-20 | Discharge: 2023-11-20 | Disposition: A | Discharge status: Home / Self Care | Payer: 59 | Source: Ambulatory Visit | Attending: Nephrology | Admitting: Nephrology

## 2023-11-20 VITALS — BP 151/74 | HR 61 | Temp 97.8°F | Resp 18

## 2023-11-20 DIAGNOSIS — N170 Acute kidney failure with tubular necrosis: Secondary | ICD-10-CM | POA: Diagnosis present

## 2023-11-20 DIAGNOSIS — N184 Chronic kidney disease, stage 4 (severe): Secondary | ICD-10-CM | POA: Insufficient documentation

## 2023-11-20 DIAGNOSIS — D631 Anemia in chronic kidney disease: Secondary | ICD-10-CM | POA: Insufficient documentation

## 2023-11-20 LAB — IRON AND TIBC
Iron: 62 ug/dL (ref 28–170)
Saturation Ratios: 19 % (ref 10.4–31.8)
TIBC: 321 ug/dL (ref 250–450)
UIBC: 259 ug/dL

## 2023-11-20 LAB — POCT HEMOGLOBIN-HEMACUE: Hemoglobin: 8.8 g/dL — ABNORMAL LOW (ref 12.0–15.0)

## 2023-11-20 LAB — FERRITIN: Ferritin: 122 ng/mL (ref 11–307)

## 2023-11-20 MED ORDER — DARBEPOETIN ALFA 100 MCG/0.5ML IJ SOSY
PREFILLED_SYRINGE | INTRAMUSCULAR | Status: AC
  Filled 2023-11-20: qty 0.5

## 2023-11-20 MED ORDER — DARBEPOETIN ALFA 100 MCG/0.5ML IJ SOSY
100.0000 ug | PREFILLED_SYRINGE | INTRAMUSCULAR | Status: DC
  Administered 2023-11-20: 100 ug via SUBCUTANEOUS

## 2023-12-10 ENCOUNTER — Other Ambulatory Visit: Payer: Self-pay

## 2023-12-10 ENCOUNTER — Inpatient Hospital Stay (HOSPITAL_COMMUNITY)
Admission: EM | Admit: 2023-12-10 | Discharge: 2023-12-12 | DRG: 291 | Disposition: A | Discharge status: Home / Self Care | Attending: Family Medicine | Admitting: Family Medicine

## 2023-12-10 ENCOUNTER — Encounter (HOSPITAL_COMMUNITY): Payer: Self-pay

## 2023-12-10 ENCOUNTER — Emergency Department (HOSPITAL_COMMUNITY)

## 2023-12-10 DIAGNOSIS — R042 Hemoptysis: Secondary | ICD-10-CM | POA: Diagnosis not present

## 2023-12-10 DIAGNOSIS — Z811 Family history of alcohol abuse and dependence: Secondary | ICD-10-CM

## 2023-12-10 DIAGNOSIS — D631 Anemia in chronic kidney disease: Secondary | ICD-10-CM | POA: Diagnosis present

## 2023-12-10 DIAGNOSIS — Z809 Family history of malignant neoplasm, unspecified: Secondary | ICD-10-CM

## 2023-12-10 DIAGNOSIS — A530 Latent syphilis, unspecified as early or late: Secondary | ICD-10-CM | POA: Diagnosis present

## 2023-12-10 DIAGNOSIS — Z794 Long term (current) use of insulin: Secondary | ICD-10-CM | POA: Diagnosis not present

## 2023-12-10 DIAGNOSIS — E7800 Pure hypercholesterolemia, unspecified: Secondary | ICD-10-CM | POA: Diagnosis present

## 2023-12-10 DIAGNOSIS — R0989 Other specified symptoms and signs involving the circulatory and respiratory systems: Principal | ICD-10-CM

## 2023-12-10 DIAGNOSIS — I2510 Atherosclerotic heart disease of native coronary artery without angina pectoris: Secondary | ICD-10-CM | POA: Diagnosis present

## 2023-12-10 DIAGNOSIS — M3131 Wegener's granulomatosis with renal involvement: Secondary | ICD-10-CM | POA: Diagnosis present

## 2023-12-10 DIAGNOSIS — E1141 Type 2 diabetes mellitus with diabetic mononeuropathy: Secondary | ICD-10-CM | POA: Diagnosis present

## 2023-12-10 DIAGNOSIS — N184 Chronic kidney disease, stage 4 (severe): Secondary | ICD-10-CM | POA: Diagnosis present

## 2023-12-10 DIAGNOSIS — Z833 Family history of diabetes mellitus: Secondary | ICD-10-CM

## 2023-12-10 DIAGNOSIS — M3130 Wegener's granulomatosis without renal involvement: Secondary | ICD-10-CM | POA: Diagnosis present

## 2023-12-10 DIAGNOSIS — Z8249 Family history of ischemic heart disease and other diseases of the circulatory system: Secondary | ICD-10-CM

## 2023-12-10 DIAGNOSIS — J441 Chronic obstructive pulmonary disease with (acute) exacerbation: Secondary | ICD-10-CM | POA: Diagnosis present

## 2023-12-10 DIAGNOSIS — N185 Chronic kidney disease, stage 5: Secondary | ICD-10-CM | POA: Diagnosis present

## 2023-12-10 DIAGNOSIS — Z87891 Personal history of nicotine dependence: Secondary | ICD-10-CM

## 2023-12-10 DIAGNOSIS — R058 Other specified cough: Secondary | ICD-10-CM | POA: Diagnosis not present

## 2023-12-10 DIAGNOSIS — E1122 Type 2 diabetes mellitus with diabetic chronic kidney disease: Secondary | ICD-10-CM | POA: Diagnosis present

## 2023-12-10 DIAGNOSIS — Z862 Personal history of diseases of the blood and blood-forming organs and certain disorders involving the immune mechanism: Secondary | ICD-10-CM | POA: Insufficient documentation

## 2023-12-10 DIAGNOSIS — N189 Chronic kidney disease, unspecified: Secondary | ICD-10-CM | POA: Insufficient documentation

## 2023-12-10 DIAGNOSIS — I5033 Acute on chronic diastolic (congestive) heart failure: Secondary | ICD-10-CM | POA: Diagnosis present

## 2023-12-10 DIAGNOSIS — Z79899 Other long term (current) drug therapy: Secondary | ICD-10-CM

## 2023-12-10 DIAGNOSIS — Z1152 Encounter for screening for COVID-19: Secondary | ICD-10-CM

## 2023-12-10 DIAGNOSIS — I132 Hypertensive heart and chronic kidney disease with heart failure and with stage 5 chronic kidney disease, or end stage renal disease: Principal | ICD-10-CM | POA: Diagnosis present

## 2023-12-10 DIAGNOSIS — Z90710 Acquired absence of both cervix and uterus: Secondary | ICD-10-CM

## 2023-12-10 DIAGNOSIS — I69341 Monoplegia of lower limb following cerebral infarction affecting right dominant side: Secondary | ICD-10-CM | POA: Diagnosis not present

## 2023-12-10 DIAGNOSIS — Z823 Family history of stroke: Secondary | ICD-10-CM | POA: Diagnosis not present

## 2023-12-10 DIAGNOSIS — J439 Emphysema, unspecified: Secondary | ICD-10-CM | POA: Diagnosis present

## 2023-12-10 DIAGNOSIS — R0609 Other forms of dyspnea: Secondary | ICD-10-CM | POA: Diagnosis not present

## 2023-12-10 DIAGNOSIS — Z860101 Personal history of adenomatous and serrated colon polyps: Secondary | ICD-10-CM

## 2023-12-10 DIAGNOSIS — I10 Essential (primary) hypertension: Secondary | ICD-10-CM | POA: Insufficient documentation

## 2023-12-10 DIAGNOSIS — R059 Cough, unspecified: Secondary | ICD-10-CM | POA: Diagnosis present

## 2023-12-10 DIAGNOSIS — Z7982 Long term (current) use of aspirin: Secondary | ICD-10-CM | POA: Diagnosis not present

## 2023-12-10 DIAGNOSIS — E8770 Fluid overload, unspecified: Secondary | ICD-10-CM | POA: Diagnosis present

## 2023-12-10 DIAGNOSIS — E119 Type 2 diabetes mellitus without complications: Secondary | ICD-10-CM

## 2023-12-10 LAB — IRON AND TIBC
Iron: 29 ug/dL (ref 28–170)
Saturation Ratios: 11 % (ref 10.4–31.8)
TIBC: 274 ug/dL (ref 250–450)
UIBC: 245 ug/dL

## 2023-12-10 LAB — URINALYSIS, ROUTINE W REFLEX MICROSCOPIC
Bilirubin Urine: NEGATIVE
Glucose, UA: NEGATIVE mg/dL
Ketones, ur: NEGATIVE mg/dL
Nitrite: NEGATIVE
Protein, ur: 100 mg/dL — AB
Specific Gravity, Urine: 1.009 (ref 1.005–1.030)
pH: 6 (ref 5.0–8.0)

## 2023-12-10 LAB — GLUCOSE, CAPILLARY
Glucose-Capillary: 136 mg/dL — ABNORMAL HIGH (ref 70–99)
Glucose-Capillary: 139 mg/dL — ABNORMAL HIGH (ref 70–99)
Glucose-Capillary: 168 mg/dL — ABNORMAL HIGH (ref 70–99)

## 2023-12-10 LAB — COMPREHENSIVE METABOLIC PANEL WITH GFR
ALT: 17 U/L (ref 0–44)
AST: 17 U/L (ref 15–41)
Albumin: 3.3 g/dL — ABNORMAL LOW (ref 3.5–5.0)
Alkaline Phosphatase: 71 U/L (ref 38–126)
Anion gap: 17 — ABNORMAL HIGH (ref 5–15)
BUN: 77 mg/dL — ABNORMAL HIGH (ref 8–23)
CO2: 18 mmol/L — ABNORMAL LOW (ref 22–32)
Calcium: 8.7 mg/dL — ABNORMAL LOW (ref 8.9–10.3)
Chloride: 104 mmol/L (ref 98–111)
Creatinine, Ser: 8.75 mg/dL — ABNORMAL HIGH (ref 0.44–1.00)
GFR, Estimated: 5 mL/min — ABNORMAL LOW (ref >60)
Glucose, Bld: 95 mg/dL (ref 70–99)
Potassium: 3.9 mmol/L (ref 3.5–5.1)
Sodium: 139 mmol/L (ref 135–145)
Total Bilirubin: 0.5 mg/dL (ref 0.0–1.2)
Total Protein: 6.3 g/dL — ABNORMAL LOW (ref 6.5–8.1)

## 2023-12-10 LAB — CBC WITH DIFFERENTIAL/PLATELET
Abs Immature Granulocytes: 0.06 10*3/uL (ref 0.00–0.07)
Basophils Absolute: 0.1 10*3/uL (ref 0.0–0.1)
Basophils Relative: 1 %
Eosinophils Absolute: 0.2 10*3/uL (ref 0.0–0.5)
Eosinophils Relative: 3 %
HCT: 26.2 % — ABNORMAL LOW (ref 36.0–46.0)
Hemoglobin: 8.1 g/dL — ABNORMAL LOW (ref 12.0–15.0)
Immature Granulocytes: 1 %
Lymphocytes Relative: 17 %
Lymphs Abs: 1.3 10*3/uL (ref 0.7–4.0)
MCH: 25.6 pg — ABNORMAL LOW (ref 26.0–34.0)
MCHC: 30.9 g/dL (ref 30.0–36.0)
MCV: 82.6 fL (ref 80.0–100.0)
Monocytes Absolute: 0.6 10*3/uL (ref 0.1–1.0)
Monocytes Relative: 7 %
Neutro Abs: 5.3 10*3/uL (ref 1.7–7.7)
Neutrophils Relative %: 71 %
Platelets: 232 10*3/uL (ref 150–400)
RBC: 3.17 MIL/uL — ABNORMAL LOW (ref 3.87–5.11)
RDW: 15.8 % — ABNORMAL HIGH (ref 11.5–15.5)
WBC: 7.5 10*3/uL (ref 4.0–10.5)
nRBC: 0 % (ref 0.0–0.2)

## 2023-12-10 LAB — RESP PANEL BY RT-PCR (RSV, FLU A&B, COVID)  RVPGX2
Influenza A by PCR: NEGATIVE
Influenza B by PCR: NEGATIVE
Resp Syncytial Virus by PCR: NEGATIVE
SARS Coronavirus 2 by RT PCR: NEGATIVE

## 2023-12-10 LAB — TROPONIN I (HIGH SENSITIVITY)
Troponin I (High Sensitivity): 17 ng/L (ref <18)
Troponin I (High Sensitivity): 16 ng/L (ref <18)

## 2023-12-10 LAB — BRAIN NATRIURETIC PEPTIDE: B Natriuretic Peptide: 245.6 pg/mL — ABNORMAL HIGH (ref 0.0–100.0)

## 2023-12-10 LAB — FERRITIN: Ferritin: 129 ng/mL (ref 11–307)

## 2023-12-10 LAB — HEMOGLOBIN A1C
Hgb A1c MFr Bld: 5.7 % — ABNORMAL HIGH (ref 4.8–5.6)
Mean Plasma Glucose: 116.89 mg/dL

## 2023-12-10 LAB — HIV ANTIBODY (ROUTINE TESTING W REFLEX): HIV Screen 4th Generation wRfx: NONREACTIVE

## 2023-12-10 MED ORDER — FUROSEMIDE 10 MG/ML IJ SOLN
120.0000 mg | Freq: Once | INTRAVENOUS | Status: AC
  Administered 2023-12-10: 120 mg via INTRAVENOUS
  Filled 2023-12-10: qty 2

## 2023-12-10 MED ORDER — DOXAZOSIN MESYLATE 2 MG PO TABS
2.0000 mg | ORAL_TABLET | Freq: Every day | ORAL | Status: DC
  Administered 2023-12-10 – 2023-12-11 (×2): 2 mg via ORAL
  Filled 2023-12-10 (×3): qty 1

## 2023-12-10 MED ORDER — ASPIRIN 81 MG PO TBEC
81.0000 mg | DELAYED_RELEASE_TABLET | Freq: Every day | ORAL | Status: DC
  Administered 2023-12-11 – 2023-12-12 (×2): 81 mg via ORAL
  Filled 2023-12-10 (×2): qty 1

## 2023-12-10 MED ORDER — LORATADINE 10 MG PO TABS
10.0000 mg | ORAL_TABLET | Freq: Every day | ORAL | Status: DC
  Administered 2023-12-11 – 2023-12-12 (×2): 10 mg via ORAL
  Filled 2023-12-10 (×2): qty 1

## 2023-12-10 MED ORDER — PREDNISONE 20 MG PO TABS
40.0000 mg | ORAL_TABLET | Freq: Every day | ORAL | Status: DC
Start: 2023-12-10 — End: 2023-12-15
  Administered 2023-12-10 – 2023-12-12 (×3): 40 mg via ORAL
  Filled 2023-12-10 (×3): qty 2

## 2023-12-10 MED ORDER — HEPARIN SODIUM (PORCINE) 5000 UNIT/ML IJ SOLN
5000.0000 [IU] | Freq: Three times a day (TID) | INTRAMUSCULAR | Status: DC
  Administered 2023-12-10 – 2023-12-11 (×4): 5000 [IU] via SUBCUTANEOUS
  Filled 2023-12-10 (×4): qty 1

## 2023-12-10 MED ORDER — SODIUM BICARBONATE 650 MG PO TABS
1300.0000 mg | ORAL_TABLET | Freq: Two times a day (BID) | ORAL | Status: DC
  Administered 2023-12-10 – 2023-12-12 (×4): 1300 mg via ORAL
  Filled 2023-12-10 (×4): qty 2

## 2023-12-10 MED ORDER — LABETALOL HCL 200 MG PO TABS: 300.0000 mg | ORAL_TABLET | Freq: Two times a day (BID) | ORAL | Status: DC

## 2023-12-10 MED ORDER — IPRATROPIUM-ALBUTEROL 0.5-2.5 (3) MG/3ML IN SOLN
3.0000 mL | RESPIRATORY_TRACT | Status: DC
  Administered 2023-12-10: 3 mL via RESPIRATORY_TRACT
  Filled 2023-12-10: qty 3

## 2023-12-10 MED ORDER — IPRATROPIUM-ALBUTEROL 0.5-2.5 (3) MG/3ML IN SOLN
3.0000 mL | Freq: Three times a day (TID) | RESPIRATORY_TRACT | Status: DC
  Administered 2023-12-10 – 2023-12-11 (×2): 3 mL via RESPIRATORY_TRACT
  Filled 2023-12-10 (×2): qty 3

## 2023-12-10 MED ORDER — AMLODIPINE BESYLATE 10 MG PO TABS
10.0000 mg | ORAL_TABLET | Freq: Every morning | ORAL | Status: DC
  Administered 2023-12-11 – 2023-12-12 (×2): 10 mg via ORAL
  Filled 2023-12-10 (×2): qty 1

## 2023-12-10 MED ORDER — INSULIN ASPART 100 UNIT/ML IJ SOLN
0.0000 [IU] | Freq: Three times a day (TID) | INTRAMUSCULAR | Status: DC
  Administered 2023-12-11 (×2): 1 [IU] via SUBCUTANEOUS
  Administered 2023-12-11: 3 [IU] via SUBCUTANEOUS
  Administered 2023-12-12: 1 [IU] via SUBCUTANEOUS

## 2023-12-10 MED ORDER — LEVOFLOXACIN 750 MG PO TABS
750.0000 mg | ORAL_TABLET | Freq: Once | ORAL | Status: AC
  Administered 2023-12-10: 750 mg via ORAL
  Filled 2023-12-10: qty 1

## 2023-12-10 MED ORDER — IPRATROPIUM-ALBUTEROL 0.5-2.5 (3) MG/3ML IN SOLN
3.0000 mL | Freq: Once | RESPIRATORY_TRACT | Status: AC
  Administered 2023-12-10: 3 mL via RESPIRATORY_TRACT
  Filled 2023-12-10: qty 3

## 2023-12-10 MED ORDER — PRAVASTATIN SODIUM 40 MG PO TABS
40.0000 mg | ORAL_TABLET | Freq: Every day | ORAL | Status: DC
  Administered 2023-12-11 – 2023-12-12 (×2): 40 mg via ORAL
  Filled 2023-12-10 (×2): qty 1

## 2023-12-10 NOTE — ED Provider Notes (Signed)
 [place] EMERGENCY DEPARTMENT AT [facility] Provider Note   CSN: [id number] Arrival date & time: 12/10/23  0849     History  Chief Complaint  Patient presents with   Cough    Catherine Aguilar is a 68 y.o. female.  HPI 68 year old female presents with shortness of breath and cough.  She reports that she called EMS today because of acute dyspnea that was severe.  They placed her on oxygen and she felt better.  She reports no fever or chest pain or leg swelling.  The cough she estimates has been for about 6 months and has yellow sputum for the past 3 weeks or so.  The shortness of breath seems to come and go but today it was particularly bad.  She denies current smoking.  Home Medications Prior to Admission medications   Medication Sig Start Date End Date Taking? Authorizing Provider  albuterol (PROVENTIL) (2.5 MG/3ML) 0.083% nebulizer solution Take 3 mLs (2.5 mg total) by nebulization every 6 (six) hours as needed for wheezing or shortness of breath. 11/06/20   Dollene Cleveland, DO  albuterol (VENTOLIN HFA) 108 (90 Base) MCG/ACT inhaler INHALE 2 PUFFS BY MOUTH EVERY 4-6 HOURS 04/18/23   Levin Erp, MD  amLODipine (NORVASC) 10 MG tablet Take 10 mg by mouth daily. 12/06/23   [provider]  aspirin 81 MG EC tablet TAKE 1 TABLET BY MOUTH EVERY DAY Patient taking differently: Take 81 mg by mouth daily. 08/09/20   Dollene Cleveland, DO  B-D UF III MINI PEN NEEDLES 31G X 5 MM MISC USE AS DIRECTED 11/04/23   Levin Erp, MD  cetirizine (ZYRTEC) 10 MG tablet Take 1 tablet (10 mg total) by mouth daily. 04/18/23   Levin Erp, MD  doxazosin (CARDURA) 2 MG tablet Take 2 mg by mouth at bedtime. 10/31/23   [provider]  fluticasone (FLONASE) 50 MCG/ACT nasal spray Place 2 sprays into both nostrils daily. 09/14/19   Glenford Bayley, NP  glucose blood (ACCU-CHEK GUIDE) test strip Check blood glucose prior to lantus administration 04/18/23   Levin Erp,  MD  insulin glargine (LANTUS SOLOSTAR) 100 UNIT/ML Solostar Pen Inject 5 Units into the skin daily. 05/14/23   Levin Erp, MD  labetalol (NORMODYNE) 300 MG tablet Take 300 mg by mouth 2 (two) times daily. 12/06/23   [provider]  Lancets Misc. (ACCU-CHEK SOFTCLIX LANCET DEV) KIT Check BG prior to administering insulin 04/18/23   Levin Erp, MD  linaclotide (LINZESS) 72 MCG capsule TAKE 1 CAPSULE (72 MCG TOTAL) BY MOUTH DAILY BEFORE BREAKFAST. Strength: 72 mcg TAKE 1 CAPSULE (72 MCG TOTAL) BY MOUTH DAILY BEFORE BREAKFAST. Strength: 72 mcg 04/18/23   Levin Erp, MD  pravastatin (PRAVACHOL) 40 MG tablet TAKE 1 TABLET BY MOUTH EVERY DAY AT BEDTIME FOR CHOLESTEROL 08/05/23   Levin Erp, MD  sodium bicarbonate 650 MG tablet Take 2 tablets (1,300 mg total) by mouth 2 (two) times daily. 11/06/20   Dollene Cleveland, DO  sulfamethoxazole-trimethoprim (BACTRIM DS) 800-160 MG tablet TAKE 1 TABLET BY MOUTH 3 (THREE) TIMES A WEEK. TAKE 1 TABLET BY MOUTH THREE TIMES A WEEK. 07/12/23   Kalman Shan, MD  Tiotropium Bromide Monohydrate (SPIRIVA RESPIMAT) 1.25 MCG/ACT AERS Inhale 2 puffs into the lungs daily. 03/31/20   Kalman Shan, MD      Allergies    Patient has no known allergies.    Review of Systems   Review of Systems  Constitutional:  Negative for fever.  Respiratory:  Positive for cough and shortness of breath.   Cardiovascular:  Negative for chest pain and leg swelling.  Gastrointestinal:  Negative for abdominal distention and abdominal pain.    Physical Exam Updated Vital Signs BP (!) 169/66 (BP Location: Right Arm)   Pulse (!) 56   Temp 98 F (36.7 C) (Oral)   Resp (!) 22   Ht 5\' 7"  (1.702 m)   Wt 92.6 kg   SpO2 98%   BMI 31.97 kg/m  Physical Exam Vitals and nursing note reviewed.  Constitutional:      General: She is not in acute distress.    Appearance: She is well-developed. She is not ill-appearing or diaphoretic.  HENT:     Head:  Normocephalic and atraumatic.  Cardiovascular:     Rate and Rhythm: Normal rate and regular rhythm.     Heart sounds: Normal heart sounds.  Pulmonary:     Effort: Pulmonary effort is normal. No tachypnea or accessory muscle usage.     Breath sounds: Wheezing (mild, diffuse) present.  Abdominal:     Palpations: Abdomen is soft.     Tenderness: There is no abdominal tenderness.  Musculoskeletal:     Right lower leg: No edema.     Left lower leg: No edema.  Skin:    General: Skin is warm and dry.  Neurological:     Mental Status: She is alert.     ED Results / Procedures / Treatments   Labs (all labs ordered are listed, but only abnormal results are displayed) Labs Reviewed  COMPREHENSIVE METABOLIC PANEL WITH GFR - Abnormal; Notable for the following components:      Result Value   CO2 18 (*)    BUN 77 (*)    Creatinine, Ser 8.75 (*)    Calcium 8.7 (*)    Total Protein 6.3 (*)    Albumin 3.3 (*)    GFR, Estimated 5 (*)    Anion gap 17 (*)    All other components within normal limits  BRAIN NATRIURETIC PEPTIDE - Abnormal; Notable for the following components:   B Natriuretic Peptide 245.6 (*)    All other components within normal limits  CBC WITH DIFFERENTIAL/PLATELET - Abnormal; Notable for the following components:   RBC 3.17 (*)    Hemoglobin 8.1 (*)    HCT 26.2 (*)    MCH 25.6 (*)    RDW 15.8 (*)    All other components within normal limits  RESP PANEL BY RT-PCR (RSV, FLU A&B, COVID)  RVPGX2  URINALYSIS, ROUTINE W REFLEX MICROSCOPIC  FERRITIN  IRON AND TIBC  HEMOGLOBIN A1C  HIV ANTIBODY (ROUTINE TESTING W REFLEX)  TROPONIN I (HIGH SENSITIVITY)  TROPONIN I (HIGH SENSITIVITY)    EKG EKG Interpretation Date/Time:  Tuesday December 10 2023 09:01:11 EDT Ventricular Rate:  59 PR Interval:  138 QRS Duration:  101 QT Interval:  463 QTC Calculation: 459 R Axis:   -28  Text Interpretation: Sinus rhythm Left atrial enlargement Borderline left axis deviation Abnormal  R-wave progression, late transition no significant change since June 2022 Confirmed by Pricilla Loveless [phone]) on 12/10/2023 9:16:27 AM  Radiology DG Chest 2 View Result Date: 12/10/2023 CLINICAL DATA:  Cough and dyspnea. EXAM: CHEST - 2 VIEW COMPARISON:  11/11/2020 FINDINGS: Low volume film. The cardio pericardial silhouette is enlarged. There is pulmonary vascular congestion without overt pulmonary edema. Basilar atelectasis noted without overt edema or focal dense consolidative airspace disease. No acute bony abnormality. Telemetry leads overlie the chest. IMPRESSION:  Low volume film with pulmonary vascular congestion and basilar atelectasis. Electronically Signed   By: Kennith Center M.D.   On: 12/10/2023 10:58    Procedures Procedures    Medications Ordered in ED Medications  ipratropium-albuterol (DUONEB) 0.5-2.5 (3) MG/3ML nebulizer solution 3 mL (has no administration in time range)  aspirin EC tablet 81 mg (has no administration in time range)  pravastatin (PRAVACHOL) tablet 40 mg (has no administration in time range)  sodium bicarbonate tablet 1,300 mg (has no administration in time range)  loratadine (CLARITIN) tablet 10 mg (has no administration in time range)  insulin aspart (novoLOG) injection 0-6 Units (has no administration in time range)  predniSONE (DELTASONE) tablet 40 mg (has no administration in time range)  levofloxacin (LEVAQUIN) tablet 750 mg (has no administration in time range)  heparin injection 5,000 Units (has no administration in time range)  ipratropium-albuterol (DUONEB) 0.5-2.5 (3) MG/3ML nebulizer solution 3 mL (3 mLs Nebulization Given 12/10/23 0912)  furosemide (LASIX) 120 mg in dextrose 5 % 50 mL IVPB (120 mg Intravenous New Bag/Given 12/10/23 1336)    ED Course/ Medical Decision Making/ A&P                                 Medical Decision Making Amount and/or Complexity of Data Reviewed External Data Reviewed: labs.    Details: Chronic kidney disease,  similar to November. Labs: ordered. Radiology: ordered and independent interpretation performed.    Details: Congestion ECG/medicine tests: ordered and independent interpretation performed.    Details: No ischemia  Risk Prescription drug management. Decision regarding hospitalization.   I suspect her shortness of breath and cough is from volume overload.  She reports she has had good urine output but has significant kidney disease.  No emergent indication for dialysis.  She is not hypoxic or in distress but I suspect she will need diuresis.  I did discuss with Dr. Glenna Fellows of nephrology who recommends 120 mg Lasix and nephrology will consult.  Will need an admission and discussed with family practice for admission.        Final Clinical Impression(s) / ED Diagnoses Final diagnoses:  Pulmonary vascular congestion    Rx / DC Orders ED Discharge Orders     None         Pricilla Loveless, MD 12/10/23 1458

## 2023-12-10 NOTE — ED Notes (Signed)
 EDP at bedside

## 2023-12-10 NOTE — ED Notes (Signed)
 IV team at bedside

## 2023-12-10 NOTE — Assessment & Plan Note (Addendum)
 BNP 245. Wheezing noted on exam. Potential CHF exacerbation component. Last echocardiogram 2013, EF 60-65%.  - Order new echo for 4/1 - Strict I/Os

## 2023-12-10 NOTE — Hospital Course (Signed)
 Catherine Aguilar is a 68 y.o. 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 Hospital course is outlined below by problem.   Acute on chronic cough Worsening cough and shortness of breath, most likely secondary to COPD exacerbation versus CHF exacerbation.  Received steroids and Levaquin with symptomatic improvement. Anoro added to regimen.  Overall appeared euvolemic but CXR showed pulmonary congestion and mildly elevated BNP therefore received Lasix 120 mg IV x 1.  Echo showed EF 60 to 65% with moderate LVH, no indication for further diuresis. Developed one episode of hemoptysis 4/2, no acute concern. Ordered symptomatic management, no further episodes over the next 24 hours. Will follow with   Anemia in chronic kidney disease HgB 8.1 on admission, with transfusion threshold of <8.0 due to CAD. Did not require pRBC transfusion. Received scheduled, monthly IV iron while inpatient. HgB stable at 8.3. ESA was not necessary.   RPR positive Likely false positive -- titer 1:1, negative. Awaiting T.palladum ab at discharge.   CKD, stage V Progressive in the setting of granulomatosis with polyangiitis. Cr elevated to 8.7 upon admission with mild uremic symptoms, nephrology consulted.  No urgent indication for dialysis, recommend continue nephrology follow-up at discharge.   Statin change Started atorvastatin 20 mg in setting of renal failure in place of pravastatin 40 mg (which is renally cleared).   Other conditions that were chronic and stable: HLD, CVA  Issues for follow up: Ensure continued nephrology follow-up for discussion of dialysis 2.   Ensure continued pulmonology follow-up for management/repeat PFTs

## 2023-12-10 NOTE — ED Notes (Signed)
 Phlebotomy at bedside.

## 2023-12-10 NOTE — ED Notes (Signed)
 Patient transported to X-ray

## 2023-12-10 NOTE — Plan of Care (Signed)
 FMTS Interim Progress Note  S: Feeling much better. Notes continuing to pee a lot. Still coughing. Has been able to ambulate to and from bathroom. No chest pain or any other pain. No HA. No extremity swelling reported. Reports taking amlodipine, labetalol, and doxazosin daily at home.   O: BP (!) 175/60 (BP Location: Right Arm)   Pulse 62   Temp 98.4 F (36.9 C) (Oral)   Resp 19   Ht 5\' 7"  (1.702 m)   Wt 92.6 kg   SpO2 98%   BMI 31.97 kg/m   General: Well-appearing. Resting comfortably in room. CV: Normal S1/S2. No extra heart sounds. Warm and well-perfused. Pulm: Breathing comfortably on room air. CTAB, no crackles or wheezes. No increased WOB. Abd: Soft, non-tender, non-distended. Skin/Ext:  Warm, dry. No LE edema.   A/P: AHRF Patient well-appearing and satting well on room air. Euvolemic on exam.  - S/p 129 mg IV lasix - consider additional lasix as indicated   - Cont Duonebs TID - consider transition to LABA/LAMA tomorrow  - Cont Prednisone 40 x 5 days - S/p Levofloxacin 750 mg x1 - consider 500 mg redose after 48h  - Repeat echo pending    HTN Elevated SBP to 170s. Patient asymptomatic at this time. Will add on some of her home BP meds.  - Home amlodipine 10 mg daily  - Home doxazosin 2 mg daily  - Holding home labetalol in setting of AHRF   Continue treatment plan as otherwise indicated in FMTS H&P.   Catherine Quale, MD 12/10/2023, 8:45 PM PGY-1, [facility] Medicine Service pager [phone]

## 2023-12-10 NOTE — ED Notes (Signed)
 Awaiting lasix infusion from main pharmacy

## 2023-12-10 NOTE — Assessment & Plan Note (Addendum)
 Chronic, but has worsened over past six months and become debilitating over past few weeks. Previous dx of COPD. Slightly increased work of breathing on exam, patient says is baseline. Denies current tobacco use, (but endorses smoking cannabis occasionally.) Cough is dry, without subjective fever or hemoptysis. Notes new yellow sputum. No frank hemoptysis similar to 2018 hospitalization. Cr 8.75 from 8.49 two weeks ago. Equivocally volume up on exam. Previous baseline two years ago ~3.5. CXR: some pulmonary vascular congestion, BNP: 245. (Somewhat elevated BNP is expected in chronic renal disease). Resp panel negative. No indication of infectious etiology. Afebrile. WBC 7.5 WNL. Will treat empirically for COPD exacerbation given this could be cause of cough and symptoms though CHF exacerbation remains on differential (below). - Admit to hospital medicine service, attending physician Dr. McDiarmid.  s/p Lasix 120 mg x1 in ED. - Start levofloxacin 750 mg x1, will consider redosing with 500 mg q48h tomorrow - Start Prednisone 40 mg qAM - Start Duonebs q4h, consider transition to LABA/LAMA qday  - Start claritin 10 mg qday - PT/OT  - Outpatient PFTs - AM RFP, CBC,TSH, fasting lipid, RPR

## 2023-12-10 NOTE — Assessment & Plan Note (Deleted)
 Possibly secondary to worsening renal function. Cr 8.75 from 8.49 two weeks ago. Previous baseline two years ago ~3.5. CXR: pulmonary vascular congestion.  - Admit to hospital medicine service, attending physician Dr. McDiarmid.  s/p Lasix 120 mg x1 in ED. - Start Lasix ***80 mg *** qAM

## 2023-12-10 NOTE — ED Notes (Signed)
Phlebotomy to obtain labs. 

## 2023-12-10 NOTE — Assessment & Plan Note (Addendum)
 Hgb: 8.1 from 8.8 two weeks ago. No evidence of bleeding.  - Agree with iron/TIBC/ferritin labs.  - Replete with 1 uPRBC if HgB <8.0 given three-vessel CAD - AM CBCs

## 2023-12-10 NOTE — Assessment & Plan Note (Addendum)
 Vasculitis in remission as of 07/26/2023. Was previously on bactrim for prophylaxis, last rituxan given 10/2022. - CTM, continue prednisone as above

## 2023-12-10 NOTE — Assessment & Plan Note (Signed)
 A1c 6.3% in August 2024. - Very sensitive SSI  - Repeat A1c

## 2023-12-10 NOTE — H&P (Addendum)
 Hospital Admission History and Physical Service Pager: [phone]  Patient name: Catherine Aguilar Medical record number: [id number] Date of Birth: February 22, 1956 Age: 68 y.o. Gender: female  Primary Care Provider: Levin Erp, MD Consultants: Nephrology  Code Status: FULL Preferred Emergency Contact: Karren Cobble [phone]  Chief Complaint: Shortness of breath  Assessment and Plan: Catherine Aguilar is a 68 y.o. female with presenting with shortness of breath and worsening cough. Differential for this patient's presentation of this includes:  COPD exacerbation (most likely): previous hacking dry cough over extended period of time, expiratory wheezing, no infectious symptomatology, only mild pulmonary edema component.  Pulmonary embolism (less likely): No acute chest pain, mild increased work of breathing, satting well on room air.  Pneumonia (less likely): afebrile, normal WBC count, no subjective fevers/chills.  Pulmonary edema (less likely): likely component, but not very volume up on physical exam, bilateral lower extremities had no appreciable edema, patient denies gaining weight over time of cough Assessment & Plan Cough Chronic, but has worsened over past six months and become debilitating over past few weeks. Previous dx of COPD. Slightly increased work of breathing on exam, patient says is baseline. Denies current tobacco use, (but endorses smoking cannabis occasionally.) Cough is dry, without subjective fever or hemoptysis. Notes new yellow sputum. No frank hemoptysis similar to 2018 hospitalization. Cr 8.75 from 8.49 two weeks ago. Equivocally volume up on exam. Previous baseline two years ago ~3.5. CXR: some pulmonary vascular congestion, BNP: 245. (Somewhat elevated BNP is expected in chronic renal disease). Resp panel negative. No indication of infectious etiology. Afebrile. WBC 7.5 WNL. Will treat empirically for COPD exacerbation given this could be cause of cough and  symptoms though CHF exacerbation remains on differential (below). - Admit to hospital medicine service, attending physician Dr. McDiarmid.  s/p Lasix 120 mg x1 in ED. - Start levofloxacin 750 mg x1, will consider redosing with 500 mg q48h tomorrow - Start Prednisone 40 mg qAM - Start Duonebs q4h, consider transition to LABA/LAMA qday  - Start claritin 10 mg qday - PT/OT  - Outpatient PFTs - AM RFP, CBC,TSH, fasting lipid, RPR Volume overload BNP 245. Wheezing noted on exam. Potential CHF exacerbation component. Last echocardiogram 2013, EF 60-65%.  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 No immediate indication for dialysis per nephrology. Discussed with patient possible need to start dialysis this hospitalization under certain conditions.  - Nephrology following, appreciate recs.  - Conitnue sodium bicarbonate 1,300 mg BID  - Follow up PTH, UA pending - Will need nephrology follow-up outpatient Anemia in chronic kidney disease Hgb: 8.1 from 8.8 two weeks ago. No evidence of bleeding.  - Agree with iron/TIBC/ferritin labs.  - Replete with 1 uPRBC if HgB <8.0 given three-vessel CAD - AM CBCs  T2DM (type 2 diabetes mellitus) (HCC) A1c 6.3% in August 2024. - Very sensitive SSI  - Repeat A1c HTN (hypertension) 169/66. Unclear what blood pressure medications she is taking at home. - Consider adding back home medication depending on control here in hospital and nephrology recommendations Granulomatosis with polyangiitis (HCC) Vasculitis "in remission" as of 07/26/2023. Was previously on bactrim for prophylaxis, last rituxan given 10/2022. - CTM, continue prednisone as above  Chronic and Stable Conditions: HLD: Continue Aspirin 81 mg qday.  Routine screening: HIV antibody CVA: Some residual right sided lower limb neuropathy  FEN/GI: Heart  healthy VTE Prophylaxis: Heparin  Disposition: med-surg  History  of Present Illness:  Catherine Aguilar is a 68 y.o. female presenting with worsening intermittent cough. Had episode of life-threatening alveolar hemorrhage and acute renal failure in 2018.   She has had an uncontrollable cough daily worsening over the last 6 months. This has been going on for the last year; when it first started, she also had nosebleeds and questionable hemoptysis. Today, the cough was so bad that she could not catch her breath which prompted her arrival to the ED. She has tried using her albuterol inhaler without much help.   No blood in the sputum or with coughing now; it is just yellow. No fevers, chills, nausea. She did vomit once after taking her medications, but this was 2 weeks ago and without blood. No changes in bowel movements or urine habits.  She denies increased swelling in her legs or on her body. No worsening SOB in the bed. No trouble sleeping. She stopped smoking 5 years. She occasionally will smoke weed, but this does not induce her cough. She denies chest pain. She has had some wheezing when she breathes, but this has not worsened with the cough. Denied orthopnea.   She is aware of her GPA diagnosis and sees her nephrologist Dr. Signe Colt regularly for ESRD.  In the ED, she was given IV Lasix 120 mg x1 for overload   Review Of Systems: Per HPI.  Pertinent Past Medical History: Granulomatosis with polyangiitis with pulmonary and renal involvement  CKD stage VI HLD HTN SOB CVA (2013, left sided deficits.  Emphysema T2DM Remainder reviewed in history tab.   Pertinent Past Surgical History: Hysterectomy (1985)  Remainder reviewed in history tab.   Pertinent Social History: Tobacco use: Former, 10 year  Alcohol use: Endorses Other Substance use: Cannabis Lives with husband at home  Pertinent Family History: Mother: MI, T2DM, HTN, CVA Father: Cancer, EtOH, T2DM, HTN Remainder reviewed  in history tab.   Important Outpatient Medications: Albuterol q6h PRN  Asipirin 81 mg qday Zyrtec 10 mg qday Flonase prn Glargine 5U into skin at bedtime Pravastatin 40 mg qday Sodium bicarbonate 650 mg BID Labetalol and hydralazine ?  Remainder reviewed in medication history.   Objective: BP (!) 152/70   Pulse (!) 55   Temp 98 F (36.7 C) (Oral)   Resp (!) 26   Ht 5\' 7"  (1.702 m)   Wt 96.6 kg   SpO2 100%   BMI 33.36 kg/m  Exam: General: older woman laying in bed on left side, no acute distress,  Cardiovascular: RRR no MRG Respiratory: some increased work of breathing, expiratory/inspiratory wheezing in all lung fields.  Gastrointestinal: no tenderness in 4 quadrants, BD+ Neuro: Visual fields intact Psych: Appropriate mood and affect  Labs:  CBC BMET  Recent Labs  Lab 12/10/23 0859  WBC 7.5  HGB 8.1*  HCT 26.2*  PLT 232   Recent Labs  Lab 12/10/23 0859  NA 139  K 3.9  CL 104  CO2 18*  BUN 77*  CREATININE 8.75*  GLUCOSE 95  CALCIUM 8.7*    Pertinent additional labs:  Troponins: 17 --> 16 Resp panel: negative BNP: 245.6 Cr: 8.75 (Bl ~3.5) Albumin: 3.3 PTH: Awaiting  Ferritin: Awaiting  Iron/TIBC: Awaiting   EKG: My own interpretation (not copied from electronic read) Sinus. Qtc: 459    Imaging Studies Performed:  CXR (4/1): Low volume film with pulmonary vascular congestion and basilar atelectasis.  Tomie China, MD 12/10/2023, 1:27 PM PGY-1, [facility]  FPTS Intern pager: [phone], text  pages welcome Secure chat group [facility] Teaching Service   I agree with the assessment and plan as documented above.  Janeal Holmes, MD PGY-2, [facility]

## 2023-12-10 NOTE — ED Notes (Signed)
 Phlebotomy to obtain second trop.

## 2023-12-10 NOTE — ED Triage Notes (Addendum)
 Pt to ED via [facility] from home. Pt c/o cough, congestion, and shortness of breath for past few months which has worsened. Pt has had yellow sputum for past few weeks.   152/82 76 96% RA

## 2023-12-10 NOTE — Assessment & Plan Note (Addendum)
 169/66. Unclear what blood pressure medications she is taking at home. - Consider adding back home medication depending on control here in hospital and nephrology recommendations

## 2023-12-10 NOTE — Assessment & Plan Note (Addendum)
 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 No immediate indication for dialysis per nephrology. Discussed with patient possible need to start dialysis this hospitalization under certain conditions.  - Nephrology following, appreciate recs.  - Conitnue sodium bicarbonate 1,300 mg BID  - Follow up PTH, UA pending - Will need nephrology follow-up outpatient

## 2023-12-10 NOTE — Plan of Care (Signed)

## 2023-12-10 NOTE — Consult Note (Signed)
 [facility]  INPATIENT CONSULTATION  Reason for Consultation: CKD 5 Requesting Provider: Dr. Criss Alvine  HPI: Catherine Aguilar is an 68 y.o. female with CKD 5 secondary GPA (dx 2018, pulm and renal involvement), HTN, HL, tobacco, emphysema, DM presenting with cough and nephrology is consulted for assistance in management of diuretics and CKD.   Presenting a few days of dry cough without fevers, chills or hemoptysis.  In ED afebrile with BP 150s, HR 50s, O2 sats normal on RA.  CXR with congestion.  Labs showing BUN 77, Cr 8.7 similar to 07/2023 (8.5)< K 3.9, Bicarb 18, Hb 8.1, WBC 7.5, Plt 232.  She has advanced CKD followed by Dr. Signe Colt but last OV 07/2023.  05/2023 renal biopsy - advanced CKD nothing active. She was advised re: vascular access but did not follow through.   She's very reserved in any discussions re: dialysis/prep today.  Admits to mild dysgeusia (which she did in 07/2023) as well but denies lethargy, pruritus, hiccups, N/V/wt loss.   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   CVA (cerebral vascular accident) (HCC) 07/03/2012   Diffuse pulmonary alveolar hemorrhage    Edema of right lower extremity 03/25/2017   Essential hypertension 12/08/2016   Hemoptysis 12/08/2016   High cholesterol    HLD (hyperlipidemia) 12/08/2016   Hx of adenomatous polyp of colon 04/16/2017   Hypertension    stopped taking medications 10 years ago   Hypokalemia 07/02/2012   Intra-alveolar hemorrhage 12/08/2016   Left facial numbness 07/02/2012   Sepsis (HCC) 12/08/2016   Shortness of  breath 12/02/2017   Stroke (HCC)    Substance abuse (HCC) 12/08/2016   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   Tobacco abuse 12/08/2016   Type 2 diabetes mellitus without complication, with long-term current use of insulin (HCC) 02/11/2017   Wegener's granulomatosis with renal involvement (HCC) 12/08/2016   PSH: Past Surgical History:  Procedure Laterality Date   ABDOMINAL HYSTERECTOMY  1985    Past Medical History:  Diagnosis Date   Acute renal failure (ARF) (HCC) 12/08/2016   Acute respiratory failure with hypoxia (HCC) 12/08/2016   AKI (acute kidney injury) (HCC) 12/08/2016   Allergy    Anemia    Asthma with COPD (chronic obstructive pulmonary disease) (HCC) 11/25/2017   CAP (community acquired pneumonia) 12/08/2016   Chronic kidney disease    CKD (chronic kidney disease), stage IV (HCC) 02/11/2017   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   CVA (cerebral vascular accident) (HCC) 07/03/2012   Diffuse pulmonary alveolar hemorrhage    Edema of right lower extremity 03/25/2017   Essential hypertension 12/08/2016   Hemoptysis 12/08/2016   High cholesterol    HLD (hyperlipidemia) 12/08/2016   Hx of adenomatous polyp of colon 04/16/2017   Hypertension    stopped taking medications 10 years ago   Hypokalemia 07/02/2012   Intra-alveolar hemorrhage 12/08/2016   Left facial numbness 07/02/2012   Sepsis (HCC) 12/08/2016   Shortness of breath 12/02/2017   Stroke (HCC)    Substance abuse (HCC) 12/08/2016   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   Tobacco abuse 12/08/2016  Type 2 diabetes mellitus without complication, with long-term current use of insulin (HCC) 02/11/2017   Wegener's granulomatosis with renal involvement (HCC) 12/08/2016    Medications:  I have reviewed the patient's current medications.  (Not in a hospital admission)   ALLERGIES:  No Known Allergies  FAM HX: Family History  Problem Relation Age of Onset   Heart attack Mother    Diabetes Mother    Hypertension Mother     Stroke Mother    Cancer Father    Alcohol abuse Father    Diabetes Father    Hypertension Father    Breast cancer Neg Hx    Colon cancer Neg Hx    Esophageal cancer Neg Hx    Rectal cancer Neg Hx    Stomach cancer Neg Hx    Pancreatic cancer Neg Hx    BRCA 1/2 Neg Hx     Social History:   reports that she quit smoking about 4 years ago. Her smoking use included cigarettes. She started smoking about 55 years ago. She has a 10.1 pack-year smoking history. She has never used smokeless tobacco. She reports current alcohol use. She reports current drug use. Drug: Marijuana.  ROS: 12 system ROS neg except per HPI above  Blood pressure (!) 152/70, pulse (!) 55, temperature 98.2 F (36.8 C), temperature source Oral, resp. rate (!) 26, height 5\' 7"  (1.702 m), weight 96.6 kg, SpO2 100%. PHYSICAL EXAM: Gen: nontoxic appearing on stretcher Eyes: EOMI ENT:MMM Neck: JVD to 10cm at 45 deg CV: RRR, no rub Abd: soft Back: rales in bases, no wheezes appreciated, occ dry cogh GU: no foley Extr:  no edema Neuro: no asterixis, conversant, awake and alert Psych: guarded affect Skin:no rashes noted   Results for orders placed or performed during the hospital encounter of 12/10/23 (from the past 48 hours)  Comprehensive metabolic panel     Status: Abnormal   Collection Time: 12/10/23  8:59 AM  Result Value Ref Range   Sodium 139 135 - 145 mmol/L   Potassium 3.9 3.5 - 5.1 mmol/L   Chloride 104 98 - 111 mmol/L   CO2 18 (L) 22 - 32 mmol/L   Glucose, Bld 95 70 - 99 mg/dL    Comment: Glucose reference range applies only to samples taken after fasting for at least 8 hours.   BUN 77 (H) 8 - 23 mg/dL   Creatinine, Ser 8.41 (H) 0.44 - 1.00 mg/dL   Calcium 8.7 (L) 8.9 - 10.3 mg/dL   Total Protein 6.3 (L) 6.5 - 8.1 g/dL   Albumin 3.3 (L) 3.5 - 5.0 g/dL   AST 17 15 - 41 U/L   ALT 17 0 - 44 U/L   Alkaline Phosphatase 71 38 - 126 U/L   Total Bilirubin 0.5 0.0 - 1.2 mg/dL   GFR, Estimated 5 (L) >60  mL/min    Comment: (NOTE) Calculated using the CKD-EPI Creatinine Equation (2021)    Anion gap 17 (H) 5 - 15    Comment: Performed at [facility], [address], [place], [place] [address]  Troponin I (High Sensitivity)     Status: None   Collection Time: 12/10/23  8:59 AM  Result Value Ref Range   Troponin I (High Sensitivity) 17 <18 ng/L    Comment: (NOTE) Elevated high sensitivity troponin I (hsTnI) values and significant  changes across serial measurements may suggest ACS but many other  chronic and acute conditions are known to elevate hsTnI results.  Refer  to the "Links" section for chest pain algorithms and additional  guidance. Performed at [facility], [address], [place], [place] [address]   Brain natriuretic peptide     Status: Abnormal   Collection Time: 12/10/23  8:59 AM  Result Value Ref Range   B Natriuretic Peptide 245.6 (H) 0.0 - 100.0 pg/mL    Comment: Performed at [facility], [address], [place], [place] [address]  CBC with Differential     Status: Abnormal   Collection Time: 12/10/23  8:59 AM  Result Value Ref Range   WBC 7.5 4.0 - 10.5 K/uL   RBC 3.17 (L) 3.87 - 5.11 MIL/uL   Hemoglobin 8.1 (L) 12.0 - 15.0 g/dL   HCT 62.9 (L) 52.8 - 41.3 %   MCV 82.6 80.0 - 100.0 fL   MCH 25.6 (L) 26.0 - 34.0 pg   MCHC 30.9 30.0 - 36.0 g/dL   RDW 24.4 (H) 01.0 - 27.2 %   Platelets 232 150 - 400 K/uL   nRBC 0.0 0.0 - 0.2 %   Neutrophils Relative % 71 %   Neutro Abs 5.3 1.7 - 7.7 K/uL   Lymphocytes Relative 17 %   Lymphs Abs 1.3 0.7 - 4.0 K/uL   Monocytes Relative 7 %   Monocytes Absolute 0.6 0.1 - 1.0 K/uL   Eosinophils Relative 3 %   Eosinophils Absolute 0.2 0.0 - 0.5 K/uL   Basophils Relative 1 %   Basophils Absolute 0.1 0.0 - 0.1 K/uL   Immature Granulocytes 1 %   Abs Immature Granulocytes 0.06 0.00 - 0.07 K/uL    Comment: Performed at [facility], [address], [place], [place] [address]    DG Chest 2 View Result  Date: 12/10/2023 CLINICAL DATA:  Cough and dyspnea. EXAM: CHEST - 2 VIEW COMPARISON:  11/11/2020 FINDINGS: Low volume film. The cardio pericardial silhouette is enlarged. There is pulmonary vascular congestion without overt pulmonary edema. Basilar atelectasis noted without overt edema or focal dense consolidative airspace disease. No acute bony abnormality. Telemetry leads overlie the chest. IMPRESSION: Low volume film with pulmonary vascular congestion and basilar atelectasis. Electronically Signed   By: Kennith Center M.D.   On: 12/10/2023 10:58    Assessment/PlanSusie Aguilar is an 68 y.o. female with CKD 5 secondary GPA (dx 2018, pulm and renal involvement), HTN, HL, tobacco, DM presenting with cough and nephrology is consulted for assistance in management of diuretics and CKD.   **Cough:  presenting with a few days of non productive cough, CXR suggests mild congestion.  Getting IV lasix and follow.  Afebrile, no leukocytosis so doesn't seem infection.  Has significant h/o pulm GPA but no hemoptysis and CXR now consistent with DAH.    **CKD 5:  advanced CKD at baseline 07/2023 Cr 8.5, now 8.7.  Very mild uremic symptoms but doesn't appear to have clear indications for dialysis.  Attempted to discuss planning for likely RRT in near future and she is unwilling to discuss; admits she never went to [facility] consultation.  Will need nephrology f/u arranged at d/c.  **Anemia of CKD:  Hb trending down gradually, now into 8s without e/o bleeding.  Will check iron now.  Consider ESA while in if iron ok.   **h/o GPA: pulm and renal involvement.  No clear active dz currently.  Last OV with pulm 07/2023 with 57mo f/u rec.  Her last dose of rituxan was 10/2022 per notes.   **HTN: follow with diuresis.  **  DM  Will follow, please reach out with concerns.   Tyler Pita 12/10/2023, 12:05 PM

## 2023-12-11 ENCOUNTER — Inpatient Hospital Stay (HOSPITAL_COMMUNITY)

## 2023-12-11 ENCOUNTER — Other Ambulatory Visit (HOSPITAL_COMMUNITY): Payer: Self-pay

## 2023-12-11 ENCOUNTER — Encounter (HOSPITAL_COMMUNITY): Payer: Self-pay | Admitting: *Deleted

## 2023-12-11 ENCOUNTER — Telehealth (HOSPITAL_COMMUNITY): Payer: Self-pay | Admitting: Pharmacy Technician

## 2023-12-11 DIAGNOSIS — J441 Chronic obstructive pulmonary disease with (acute) exacerbation: Secondary | ICD-10-CM | POA: Diagnosis not present

## 2023-12-11 DIAGNOSIS — R042 Hemoptysis: Secondary | ICD-10-CM

## 2023-12-11 DIAGNOSIS — R0609 Other forms of dyspnea: Secondary | ICD-10-CM | POA: Diagnosis not present

## 2023-12-11 DIAGNOSIS — A530 Latent syphilis, unspecified as early or late: Secondary | ICD-10-CM | POA: Insufficient documentation

## 2023-12-11 LAB — ECHOCARDIOGRAM COMPLETE
AR max vel: 2.5 cm2
AV Area VTI: 2.46 cm2
AV Area mean vel: 2.47 cm2
AV Mean grad: 8 mmHg
AV Peak grad: 14.7 mmHg
Ao pk vel: 1.92 m/s
Area-P 1/2: 2.39 cm2
Calc EF: 72.1 %
Height: 67 in
MV VTI: 5.12 cm2
S' Lateral: 3.5 cm
Single Plane A2C EF: 67.4 %
Single Plane A4C EF: 73.6 %
Weight: 3287.5 [oz_av]

## 2023-12-11 LAB — LIPID PANEL
Cholesterol: 99 mg/dL (ref 0–200)
HDL: 32 mg/dL — ABNORMAL LOW (ref >40)
LDL Cholesterol: 48 mg/dL (ref 0–99)
Total CHOL/HDL Ratio: 3.1 ratio
Triglycerides: 97 mg/dL (ref <150)
VLDL: 19 mg/dL (ref 0–40)

## 2023-12-11 LAB — GLUCOSE, CAPILLARY
Glucose-Capillary: 170 mg/dL — ABNORMAL HIGH (ref 70–99)
Glucose-Capillary: 170 mg/dL — ABNORMAL HIGH (ref 70–99)
Glucose-Capillary: 277 mg/dL — ABNORMAL HIGH (ref 70–99)
Glucose-Capillary: 158 mg/dL — ABNORMAL HIGH (ref 70–99)
Glucose-Capillary: 152 mg/dL — ABNORMAL HIGH (ref 70–99)

## 2023-12-11 LAB — RENAL FUNCTION PANEL
Albumin: 3.1 g/dL — ABNORMAL LOW (ref 3.5–5.0)
Anion gap: 15 (ref 5–15)
BUN: 89 mg/dL — ABNORMAL HIGH (ref 8–23)
CO2: 16 mmol/L — ABNORMAL LOW (ref 22–32)
Calcium: 8.8 mg/dL — ABNORMAL LOW (ref 8.9–10.3)
Chloride: 104 mmol/L (ref 98–111)
Creatinine, Ser: 9.25 mg/dL — ABNORMAL HIGH (ref 0.44–1.00)
GFR, Estimated: 4 mL/min — ABNORMAL LOW (ref >60)
Glucose, Bld: 145 mg/dL — ABNORMAL HIGH (ref 70–99)
Phosphorus: 5 mg/dL — ABNORMAL HIGH (ref 2.5–4.6)
Potassium: 4.2 mmol/L (ref 3.5–5.1)
Sodium: 135 mmol/L (ref 135–145)

## 2023-12-11 LAB — CBC
HCT: 24.9 % — ABNORMAL LOW (ref 36.0–46.0)
Hemoglobin: 8 g/dL — ABNORMAL LOW (ref 12.0–15.0)
MCH: 25.4 pg — ABNORMAL LOW (ref 26.0–34.0)
MCHC: 32.1 g/dL (ref 30.0–36.0)
MCV: 79 fL — ABNORMAL LOW (ref 80.0–100.0)
Platelets: 228 10*3/uL (ref 150–400)
RBC: 3.15 MIL/uL — ABNORMAL LOW (ref 3.87–5.11)
RDW: 15.5 % (ref 11.5–15.5)
WBC: 8.9 10*3/uL (ref 4.0–10.5)
nRBC: 0 % (ref 0.0–0.2)

## 2023-12-11 LAB — RPR
RPR Ser Ql: REACTIVE — AB
RPR Titer: 1:1 {titer}

## 2023-12-11 LAB — TSH: TSH: 0.607 u[IU]/mL (ref 0.350–4.500)

## 2023-12-11 MED ORDER — GUAIFENESIN 100 MG/5ML PO LIQD: 5.0000 mL | ORAL | Status: DC | PRN

## 2023-12-11 MED ORDER — UMECLIDINIUM-VILANTEROL 62.5-25 MCG/ACT IN AEPB
1.0000 | INHALATION_SPRAY | Freq: Every day | RESPIRATORY_TRACT | Status: DC
  Administered 2023-12-11 – 2023-12-12 (×2): 1 via RESPIRATORY_TRACT
  Filled 2023-12-11: qty 14

## 2023-12-11 MED ORDER — IRON SUCROSE 200 MG IVPB - SIMPLE MED
200.0000 mg | Status: DC
  Administered 2023-12-11: 200 mg via INTRAVENOUS
  Filled 2023-12-11 (×2): qty 110

## 2023-12-11 MED ORDER — LEVOFLOXACIN 500 MG PO TABS
500.0000 mg | ORAL_TABLET | ORAL | Status: DC
  Administered 2023-12-12: 500 mg via ORAL
  Filled 2023-12-11: qty 1

## 2023-12-11 NOTE — Assessment & Plan Note (Addendum)
 Cr worsened overnight 8.75 --> 9.25.  - Nephrology following, appreciate recs.  - Hold redosing lasix for now. - Conitnue sodium bicarbonate 1,300 mg BID  - Follow up PTH, UA pending - Will need nephrology follow-up outpatient

## 2023-12-11 NOTE — Assessment & Plan Note (Addendum)
 169/66. Unclear what blood pressure medications she is taking at home. - Continue Amlodipine 10 mg  - Continue Doxazosin 2 mg   - Will consider adding Coreg in place of home labetalol

## 2023-12-11 NOTE — Progress Notes (Signed)
 Daily Progress Note Intern Pager: [phone]  Patient name: Catherine Aguilar Medical record number: [id number] Date of birth: [birth year] Age: 68 y.o. Gender: female  Primary Care Provider: Levin Erp, MD Consultants: Nephrology Code Status: Full  Pt Overview and Major Events to Date:   68 Y/O F with PMHx of CKD, CVA, HLD, HTN, DM2, hx of tobacco use (not current) and Wegener granulomatosis presents with SOB and coughing x a few months. She endorsed orthopnea and dyspnea on exertion; she wheezes here and there. Her cough was productive of greenish-yellow sputum with no blood in it, which developed into hemoptysis in AM of 4/2.   4/2: Homero Fellers hemoptysis noted on AM 4/2 Assessment & Plan COPD exacerbation (HCC) Started exhibiting frank hemoptysis this morning. Will consider CT chest if this does not abate. Otherwise feels as if she is breathing easier. Will add LABA/LAMA and continue COPD exacerbation treatment.   - Will start Levofloxacin 500 mg q48h tomorrow - Continue Prednisone 40 mg qAM - Start Anoro Ellipta 62.5-25 one puff qday  - Start Robitussin  - Will consider CT if cough worsens  - Continue claritin 10 mg qday - PT/OT  - Outpatient pulm/PFTs  Volume overload Echo 4/2: LVEF 60-65%. Physical exam is unconvincing for volume overload.  - Strict I/Os Positive RPR test - Await RPR titers and T. Palladium AB Chronic kidney disease (CKD), stage IV (severe) (HCC) Cr worsened overnight 8.75 --> 9.25.  - Nephrology following, appreciate recs.  - Hold redosing lasix for now. - Conitnue sodium bicarbonate 1,300 mg BID  - Follow up PTH, UA pending - Will need nephrology follow-up outpatient HTN (hypertension) 169/66. Unclear what blood pressure medications she is taking at home. - Continue Amlodipine 10 mg  - Continue Doxazosin 2 mg   - Will consider adding Coreg in place of home labetalol  Anemia in chronic kidney disease Hgb: 8.1 --> 8.0 two weeks ago. No evidence of  bleeding.  - Iron infusion, receives monthly  - Hold ESA pending iron repletion per nephro recs - Replete with 1 uPRBC if HgB <8.0 given three-vessel CAD - AM CBCs  T2DM (type 2 diabetes mellitus) (HCC) A1c 6.3% in August 2024. - Very sensitive SSI  - Repeat A1c Granulomatosis with polyangiitis (HCC) Vasculitis in remission as of 07/26/2023. Was previously on bactrim for prophylaxis, last rituxan given 10/2022. - CTM, continue prednisone as above  Chronic and Stable Issues:  HLD: Continue Aspirin 81 mg qday.  CVA: Some residual right sided lower limb neuropathy, baseline   FEN/GI: Heart healthy PPx: Heparin Dispo:Pending clinical improvement.   Subjective:   On interview, patient in good spirits despite repeated wet cough which produces blood. Otherwise breathing is similar to yesterday.   Objective:  BP: 182/73 HR: 67 RR: 19 T: 98.6 O2sat: 98%  Significant vitals over past 24 hours:   Physical Exam:  General: Older female, who intermittently produces a wet, hacking cough Cardiovascular: RRR no m/r/g. Respiratory: CTAB. No crackles noted. Increase work of breathing similar to yesterday.  Abdomen: No tenderness in 4 quadrants. BS+.  Extremities: ROM intact.  Nonedematous in bilateral lower extremities.   Basic labs:  Most recent CBC Lab Results  Component Value Date   WBC 8.9 12/11/2023   HGB 8.0 (L) 12/11/2023   HCT 24.9 (L) 12/11/2023   MCV 79.0 (L) 12/11/2023   PLT 228 12/11/2023   Most recent BMP    Latest Ref Rng & Units 12/11/2023    6:23 AM  BMP  Glucose 70 -  99 mg/dL 413   BUN 8 - 23 mg/dL 89   Creatinine 2.44 - 1.00 mg/dL 0.10   Sodium 272 - 536 mmol/L 135   Potassium 3.5 - 5.1 mmol/L 4.2   Chloride 98 - 111 mmol/L 104   CO2 22 - 32 mmol/L 16   Calcium 8.9 - 10.3 mg/dL 8.8     Other pertinent labs:  AM Lipid panel: WNL RPR: positive T. Pallidum: awaiting TSH: 0.607 WNL PTH: Awaiting HIV: WNL A1c: 5.7% UA: protein, moderate  leuks Iron/ferritin/TIBC: WNL  Imaging/Diagnostic Tests:  Echocardiogram (4/2):   1. Left ventricular ejection fraction, by estimation, is 60 to 65%. The  left ventricle has normal function. The left ventricle has no regional  wall motion abnormalities. There is moderate left ventricular hypertrophy.  Left ventricular diastolic  parameters are indeterminate.   2. Right ventricular systolic function is normal. The right ventricular  size is normal.   3. Left atrial size was mildly dilated.   4. A small pericardial effusion is present.   5. The mitral valve is normal in structure. Trivial mitral valve  regurgitation. No evidence of mitral stenosis.   6. The aortic valve is tricuspid. Aortic valve regurgitation is not  visualized. Aortic valve sclerosis/calcification is present, without any  evidence of aortic stenosis.   7. The inferior vena cava is normal in size with greater than 50%  respiratory variability, suggesting right atrial pressure of 3 mmHg.   Tomie China, MD 12/11/2023, 8:44 AM  PGY-1, [facility] FPTS Intern pager: [phone], text pages welcome Secure chat group [facility] Teaching Service

## 2023-12-11 NOTE — Assessment & Plan Note (Addendum)
 Vasculitis in remission as of 07/26/2023. Was previously on bactrim for prophylaxis, last rituxan given 10/2022. - CTM, continue prednisone as above

## 2023-12-11 NOTE — Telephone Encounter (Signed)
 Patient Product/process development scientist completed.    The patient is insured through [facility]. Patient has Medicare and is not eligible for a copay card, but may be able to apply for patient assistance or Medicare RX Payment Plan (Patient Must reach out to their plan, if eligible for payment plan), if available.    Ran test claim for Anoro Ellipta  and the current 30 day co-pay is $0.00.   This test claim was processed through [facility]- copay amounts may vary at other pharmacies due to pharmacy/plan contracts, or as the patient moves through the different stages of their insurance plan.     Roland Earl, CPHT Pharmacy Technician III Certified Patient Advocate [facility] Patient Advocate Team Direct Number: [phone]  Fax: [phone]

## 2023-12-11 NOTE — Progress Notes (Signed)
  [facility] Progress Note   Subjective:   Feeling improved but continued cough.  No edema, N/V. Didn't eat dinner last night but planning to eat breakfast this AM.   I/Os 680 / 1300  Objective Vitals:   12/10/23 1635 12/10/23 2020 12/10/23 2032 12/11/23 0445  BP: (!) 176/74 (!) 175/60  (!) 160/77  Pulse: 64 62 64 73  Resp: 17 19 18 19   Temp: 98 F (36.7 C) 98.4 F (36.9 C)  98 F (36.7 C)  TempSrc: Oral Oral  Oral  SpO2: 100% 98% 99% 95%  Weight:    93.2 kg  Height:       Physical Exam Gen: Comfortable in bed ENT:MMM CV: RRR, no rub Abd: soft Back: no rales, occ wheeze - frequent dry hacking cough GU: no foley Extr:  no edema Neuro: no asterixis, conversant, awake and alert Psych: guarded affect Skin:no rashes noted  Additional Objective Labs: Basic Metabolic Panel: Recent Labs  Lab 12/10/23 0859  NA 139  K 3.9  CL 104  CO2 18*  GLUCOSE 95  BUN 77*  CREATININE 8.75*  CALCIUM 8.7*   Liver Function Tests: Recent Labs  Lab 12/10/23 0859  AST 17  ALT 17  ALKPHOS 71  BILITOT 0.5  PROT 6.3*  ALBUMIN 3.3*   No results for input(s): "LIPASE", "AMYLASE" in the last 168 hours. CBC: Recent Labs  Lab 12/10/23 0859 12/11/23 0623  WBC 7.5 8.9  NEUTROABS 5.3  --   HGB 8.1* 8.0*  HCT 26.2* 24.9*  MCV 82.6 79.0*  PLT 232 228   Blood Culture    Component Value Date/Time   SDES EXPECTORATED SPUTUM 12/08/2016 0108   SDES EXPECTORATED SPUTUM 12/08/2016 0108   SPECREQUEST NONE 12/08/2016 0108   SPECREQUEST NONE Reflexed from [id number] 12/08/2016 0108   CULT  12/08/2016 0108    Consistent with normal respiratory flora. Performed at [facility], [address] [place], [place] [address]    REPTSTATUS 12/08/2016 FINAL 12/08/2016 0108   REPTSTATUS 12/10/2016 FINAL 12/08/2016 0108    Cardiac Enzymes: No results for input(s): "CKTOTAL", "CKMB", "CKMBINDEX", "TROPONINI" in the last 168 hours. CBG: Recent Labs  Lab 12/10/23 1538  12/10/23 2031 12/10/23 2353 12/11/23 0447  GLUCAP 139* 136* 168* 170*   Iron Studies:  Recent Labs    12/10/23 1344  IRON 29  TIBC 274  FERRITIN 129   @[email or web address] @ Studies/Results: DG Chest 2 View Result Date: 12/10/2023 CLINICAL DATA:  Cough and dyspnea. EXAM: CHEST - 2 VIEW COMPARISON:  11/11/2020 FINDINGS: Low volume film. The cardio pericardial silhouette is enlarged. There is pulmonary vascular congestion without overt pulmonary edema. Basilar atelectasis noted without overt edema or focal dense consolidative airspace disease. No acute bony abnormality. Telemetry leads overlie the chest. IMPRESSION: Low volume film with pulmonary vascular congestion and basilar atelectasis. Electronically Signed   By: Kennith Center M.D.   On: 12/10/2023 10:58   Medications:   amLODipine  10 mg Oral q AM   aspirin EC  81 mg Oral Daily   doxazosin  2 mg Oral QHS   heparin  5,000 Units Subcutaneous Q8H   insulin aspart  0-6 Units Subcutaneous TID WC   ipratropium-albuterol  3 mL Nebulization TID   loratadine  10 mg Oral Daily   pravastatin  40 mg Oral Daily   predniSONE  40 mg Oral Q breakfast   sodium bicarbonate  1,300 mg Oral BID    Assessment/PlanSusie Aguilar is an 68 y.o. female with  CKD 5 secondary GPA (dx 2018, pulm and renal involvement), HTN, HL, tobacco, DM presenting with cough and nephrology is consulted for assistance in management of diuretics and CKD.    **Cough:  presenting with a few days of non productive cough, CXR suggested mild congestion. S/p 1 dose of IV lasix and is also being treated with steroids and nebs for poss COPD exacerbation.  Appearing euvolemic, don't see need for more diuresis at this time.   Afebrile, no leukocytosis so doesn't seem infection.  Has significant h/o pulm GPA but no hemoptysis and CXR not consistent with DAH.     **CKD 5:  advanced CKD at baseline 07/2023 Cr 8.5, now 8.7.  AM labs haven't resulted, will f/u.  Very mild uremic symptoms but  doesn't appear to have clear indications for dialysis.  Attempted to discuss planning for likely RRT in near future and she is unwilling to discuss; admits she never went to [facility] consultation.  Will need nephrology f/u arranged at d/c.   **Anemia of CKD:  Hb trending down gradually, now into 8s without e/o bleeding.  Iron sat 11%, will dose with IV iron while in given poor f/u in recent past.  Hold ESA pending iron repletion.    **h/o GPA: pulm and renal involvement.  No clear active dz currently.  Last OV with pulm 07/2023 with 20mo f/u rec.  Her last dose of rituxan was 10/2022 per notes.    **HTN: home meds have been resumed, titrate as needed.    **DM   Will follow, please reach out with concerns.   Estill Bakes MD 12/11/2023, 7:23 AM  [facility] Pager: [phone]

## 2023-12-11 NOTE — Assessment & Plan Note (Addendum)
 Started exhibiting frank hemoptysis this morning. Will consider CT chest if this does not abate. Otherwise feels as if she is breathing easier. Will add LABA/LAMA and continue COPD exacerbation treatment.   - Will start Levofloxacin 500 mg q48h tomorrow - Continue Prednisone 40 mg qAM - Start Anoro Ellipta 62.5-25 one puff qday  - Start Robitussin  - Will consider CT if cough worsens  - Continue claritin 10 mg qday - PT/OT  - Outpatient pulm/PFTs

## 2023-12-11 NOTE — Assessment & Plan Note (Signed)
-   Await RPR titers and T. Palladium AB

## 2023-12-11 NOTE — Progress Notes (Signed)
  Echocardiogram 2D Echocardiogram has been performed.  Ocie Doyne RDCS 12/11/2023, 9:13 AM

## 2023-12-11 NOTE — Assessment & Plan Note (Addendum)
 Echo 4/2: LVEF 60-65%. Physical exam is unconvincing for volume overload.  - Strict I/Os

## 2023-12-11 NOTE — Assessment & Plan Note (Signed)
 A1c 6.3% in August 2024. - Very sensitive SSI  - Repeat A1c

## 2023-12-11 NOTE — Assessment & Plan Note (Addendum)
 Hgb: 8.1 --> 8.0 two weeks ago. No evidence of bleeding.  - Iron infusion, receives monthly  - Hold ESA pending iron repletion per nephro recs - Replete with 1 uPRBC if HgB <8.0 given three-vessel CAD - AM CBCs

## 2023-12-11 NOTE — Discharge Instructions (Addendum)
 Dear Peggye Pitt,   Thank you so much for allowing [place] to be part of your care!  You were admitted to [facility] for difficulty breathing. You received steroids and antibiotics for probable COPD exacerbation. You also received medications to help remove extra fluid from your body.   POST-HOSPITAL & CARE INSTRUCTIONS Please follow up at your outpatient doctors appointments outlined below, as well as with Nephrology, who will schedule their appointment separately.  Please let PCP/Specialists know of any changes that were made.  Please see medications section of this packet for any medication changes.   DOCTOR'S APPOINTMENT & FOLLOW UP CARE INSTRUCTIONS  Future Appointments  Date Time Provider Department Center  12/17/2023  2:50 PM Levin Erp, MD [facility]  12/18/2023 12:45 PM [facility]-INJECTION ROOM [facility] None  01/21/2024 12:00 PM GI-[facility] DX DEXA 1 GI-[facility] GI-BREAST CE    RETURN PRECAUTIONS: - Difficulty breathing - Sharp chest pain - Swelling in your extremities  Take care and be well!  Family Medicine Teaching Service  [place]  [facility]  [address] [place], [place] [address] [phone]

## 2023-12-12 ENCOUNTER — Other Ambulatory Visit (HOSPITAL_COMMUNITY): Payer: Self-pay

## 2023-12-12 DIAGNOSIS — R058 Other specified cough: Secondary | ICD-10-CM | POA: Diagnosis not present

## 2023-12-12 LAB — RENAL FUNCTION PANEL
Albumin: 3.2 g/dL — ABNORMAL LOW (ref 3.5–5.0)
Anion gap: 18 — ABNORMAL HIGH (ref 5–15)
BUN: 99 mg/dL — ABNORMAL HIGH (ref 8–23)
CO2: 17 mmol/L — ABNORMAL LOW (ref 22–32)
Calcium: 9 mg/dL (ref 8.9–10.3)
Chloride: 102 mmol/L (ref 98–111)
Creatinine, Ser: 8.97 mg/dL — ABNORMAL HIGH (ref 0.44–1.00)
GFR, Estimated: 4 mL/min — ABNORMAL LOW (ref >60)
Glucose, Bld: 118 mg/dL — ABNORMAL HIGH (ref 70–99)
Phosphorus: 5.5 mg/dL — ABNORMAL HIGH (ref 2.5–4.6)
Potassium: 3.5 mmol/L (ref 3.5–5.1)
Sodium: 137 mmol/L (ref 135–145)

## 2023-12-12 LAB — CBC
HCT: 25.6 % — ABNORMAL LOW (ref 36.0–46.0)
Hemoglobin: 8.3 g/dL — ABNORMAL LOW (ref 12.0–15.0)
MCH: 25.4 pg — ABNORMAL LOW (ref 26.0–34.0)
MCHC: 32.4 g/dL (ref 30.0–36.0)
MCV: 78.3 fL — ABNORMAL LOW (ref 80.0–100.0)
Platelets: 266 10*3/uL (ref 150–400)
RBC: 3.27 MIL/uL — ABNORMAL LOW (ref 3.87–5.11)
RDW: 15.5 % (ref 11.5–15.5)
WBC: 10.8 10*3/uL — ABNORMAL HIGH (ref 4.0–10.5)
nRBC: 0 % (ref 0.0–0.2)

## 2023-12-12 LAB — PTH, INTACT AND CALCIUM
Calcium, Total (PTH): 8.7 mg/dL (ref 8.7–10.3)
PTH: 606 pg/mL — ABNORMAL HIGH (ref 15–65)

## 2023-12-12 LAB — T.PALLIDUM AB, TOTAL: T Pallidum Abs: REACTIVE — AB

## 2023-12-12 LAB — GLUCOSE, CAPILLARY
Glucose-Capillary: 137 mg/dL — ABNORMAL HIGH (ref 70–99)
Glucose-Capillary: 198 mg/dL — ABNORMAL HIGH (ref 70–99)
Glucose-Capillary: 111 mg/dL — ABNORMAL HIGH (ref 70–99)

## 2023-12-12 MED ORDER — IPRATROPIUM-ALBUTEROL 0.5-2.5 (3) MG/3ML IN SOLN: 3.0000 mL | RESPIRATORY_TRACT | Status: DC | PRN

## 2023-12-12 MED ORDER — UMECLIDINIUM-VILANTEROL 62.5-25 MCG/ACT IN AEPB
1.0000 | INHALATION_SPRAY | Freq: Every day | RESPIRATORY_TRACT | 0 refills | Status: DC
  Filled 2023-12-12: qty 60, 30d supply, fill #0

## 2023-12-12 MED ORDER — ALBUTEROL SULFATE (2.5 MG/3ML) 0.083% IN NEBU
INHALATION_SOLUTION | RESPIRATORY_TRACT | Status: AC
  Administered 2023-12-12: 2.5 mg
  Filled 2023-12-12: qty 3

## 2023-12-12 MED ORDER — LEVOFLOXACIN 500 MG PO TABS
500.0000 mg | ORAL_TABLET | ORAL | 0 refills | Status: DC
  Filled 2023-12-12: qty 1, 2d supply, fill #0

## 2023-12-12 MED ORDER — SODIUM CHLORIDE 0.9 % IV SOLN
200.0000 mg | INTRAVENOUS | Status: DC
  Administered 2023-12-12: 200 mg via INTRAVENOUS
  Filled 2023-12-12: qty 10

## 2023-12-12 MED ORDER — PREDNISONE 20 MG PO TABS
40.0000 mg | ORAL_TABLET | Freq: Every day | ORAL | 0 refills | Status: AC
  Filled 2023-12-12: qty 4, 2d supply, fill #0

## 2023-12-12 MED ORDER — ATORVASTATIN CALCIUM 20 MG PO TABS
20.0000 mg | ORAL_TABLET | Freq: Every day | ORAL | 0 refills | Status: DC
  Filled 2023-12-12: qty 30, 30d supply, fill #0

## 2023-12-12 MED ORDER — GUAIFENESIN 100 MG/5ML PO LIQD: 5.0000 mL | ORAL | Status: AC | PRN

## 2023-12-12 NOTE — Progress Notes (Signed)
 [facility] Progress Note   Subjective:   Feeling improved but continued cough.  No edema, N/V.  No uremic symptoms.     I/Os 660 / 400 ( no diuretic)  Objective Vitals:   12/11/23 1706 12/11/23 2045 12/12/23 0446 12/12/23 0510  BP: (!) 159/76 (!) 183/90 (!) 172/79   Pulse: 66 74 64   Resp:  19 19   Temp: 98.1 F (36.7 C) 99.1 F (37.3 C) 98.7 F (37.1 C)   TempSrc: Oral Oral Oral   SpO2: 97% 100% 98%   Weight:    91.4 kg  Height:       Physical Exam Gen: Comfortable in bed ENT:MMM CV: RRR, no rub Abd: soft Back: no rales, occ wheeze - much less coughing today GU: no foley Extr:  no edema Neuro: no asterixis, conversant, awake and alert Psych: guarded affect Skin:no rashes noted  Additional Objective Labs: Basic Metabolic Panel: Recent Labs  Lab 12/10/23 0859 12/11/23 0623  NA 139 135  K 3.9 4.2  CL 104 104  CO2 18* 16*  GLUCOSE 95 145*  BUN 77* 89*  CREATININE 8.75* 9.25*  CALCIUM 8.7* 8.8*  PHOS  --  5.0*   Liver Function Tests: Recent Labs  Lab 12/10/23 0859 12/11/23 0623  AST 17  --   ALT 17  --   ALKPHOS 71  --   BILITOT 0.5  --   PROT 6.3*  --   ALBUMIN 3.3* 3.1*   No results for input(s): "LIPASE", "AMYLASE" in the last 168 hours. CBC: Recent Labs  Lab 12/10/23 0859 12/11/23 0623  WBC 7.5 8.9  NEUTROABS 5.3  --   HGB 8.1* 8.0*  HCT 26.2* 24.9*  MCV 82.6 79.0*  PLT 232 228   Blood Culture    Component Value Date/Time   SDES EXPECTORATED SPUTUM 12/08/2016 0108   SDES EXPECTORATED SPUTUM 12/08/2016 0108   SPECREQUEST NONE 12/08/2016 0108   SPECREQUEST NONE Reflexed from [id number] 12/08/2016 0108   CULT  12/08/2016 0108    Consistent with normal respiratory flora. Performed at [facility], [address], [place], [place] [address]    REPTSTATUS 12/08/2016 FINAL 12/08/2016 0108   REPTSTATUS 12/10/2016 FINAL 12/08/2016 0108    Cardiac Enzymes: No results for input(s): "CKTOTAL", "CKMB", "CKMBINDEX",  "TROPONINI" in the last 168 hours. CBG: Recent Labs  Lab 12/11/23 0807 12/11/23 1209 12/11/23 1708 12/11/23 2048 12/12/23 0741  GLUCAP 170* 277* 158* 152* 137*   Iron Studies:  Recent Labs    12/10/23 1344  IRON 29  TIBC 274  FERRITIN 129   @[email or web address] @ Studies/Results: ECHOCARDIOGRAM COMPLETE Result Date: 12/11/2023    ECHOCARDIOGRAM REPORT   Patient Name:   Catherine Aguilar Date of Exam: 12/11/2023 Medical Rec #:  [id number]       Height:       67.0 in Accession #:    [id number]      Weight:       205.5 lb Date of Birth:  09-30-1955       BSA:          2.046 m Patient Age:    67 years        BP:           182/73 mmHg Patient Gender: F               HR:           71 bpm. Exam Location:  Inpatient Procedure: 2D Echo, Cardiac Doppler and  Color Doppler (Both Spectral and Color            Flow Doppler were utilized during procedure). Indications:     Dyspnea  History:         Patient has prior history of Echocardiogram examinations, most                  recent 07/02/2012. Stroke and COPD; Risk Factors:Dyslipidemia,                  Hypertension and Diabetes. CKD stage 4.  Sonographer:     Vern Claude Referring Phys:  13 TODD D MCDIARMID Diagnosing Phys: Epifanio Lesches MD IMPRESSIONS  1. Left ventricular ejection fraction, by estimation, is 60 to 65%. The left ventricle has normal function. The left ventricle has no regional wall motion abnormalities. There is moderate left ventricular hypertrophy. Left ventricular diastolic parameters are indeterminate.  2. Right ventricular systolic function is normal. The right ventricular size is normal.  3. Left atrial size was mildly dilated.  4. A small pericardial effusion is present.  5. The mitral valve is normal in structure. Trivial mitral valve regurgitation. No evidence of mitral stenosis.  6. The aortic valve is tricuspid. Aortic valve regurgitation is not visualized. Aortic valve sclerosis/calcification is present, without any evidence of  aortic stenosis.  7. The inferior vena cava is normal in size with greater than 50% respiratory variability, suggesting right atrial pressure of 3 mmHg. FINDINGS  Left Ventricle: Left ventricular ejection fraction, by estimation, is 60 to 65%. The left ventricle has normal function. The left ventricle has no regional wall motion abnormalities. The left ventricular internal cavity size was normal in size. There is  moderate left ventricular hypertrophy. Left ventricular diastolic parameters are indeterminate. Right Ventricle: The right ventricular size is normal. No increase in right ventricular wall thickness. Right ventricular systolic function is normal. Left Atrium: Left atrial size was mildly dilated. Right Atrium: Right atrial size was normal in size. Pericardium: A small pericardial effusion is present. Mitral Valve: The mitral valve is normal in structure. Trivial mitral valve regurgitation. No evidence of mitral valve stenosis. MV peak gradient, 4.0 mmHg. The mean mitral valve gradient is 1.0 mmHg. Tricuspid Valve: The tricuspid valve is normal in structure. Tricuspid valve regurgitation is trivial. Aortic Valve: The aortic valve is tricuspid. Aortic valve regurgitation is not visualized. Aortic valve sclerosis/calcification is present, without any evidence of aortic stenosis. Aortic valve mean gradient measures 8.0 mmHg. Aortic valve peak gradient measures 14.7 mmHg. Aortic valve area, by VTI measures 2.46 cm. Pulmonic Valve: The pulmonic valve was not well visualized. Pulmonic valve regurgitation is not visualized. Aorta: The aortic root and ascending aorta are structurally normal, with no evidence of dilitation. Venous: The inferior vena cava is normal in size with greater than 50% respiratory variability, suggesting right atrial pressure of 3 mmHg. IAS/Shunts: The interatrial septum was not well visualized.  LEFT VENTRICLE PLAX 2D LVIDd:         5.00 cm      Diastology LVIDs:         3.50 cm      LV e'  medial:    6.57 cm/s LV PW:         1.40 cm      LV E/e' medial:  11.9 LV IVS:        1.40 cm      LV e' lateral:   5.45 cm/s LVOT diam:     1.80 cm  LV E/e' lateral: 14.3 LV SV:         106 LV SV Index:   52 LVOT Area:     2.54 cm  LV Volumes (MOD) LV vol d, MOD A2C: 101.0 ml LV vol d, MOD A4C: 180.0 ml LV vol s, MOD A2C: 32.9 ml LV vol s, MOD A4C: 47.6 ml LV SV MOD A2C:     68.1 ml LV SV MOD A4C:     180.0 ml LV SV MOD BP:      102.7 ml RIGHT VENTRICLE             IVC RV Basal diam:  3.70 cm     IVC diam: 1.80 cm RV Mid diam:    2.60 cm RV S prime:     15.50 cm/s TAPSE (M-mode): 2.3 cm LEFT ATRIUM              Index        RIGHT ATRIUM           Index LA diam:        4.70 cm  2.30 cm/m   RA Area:     18.30 cm LA Vol (A2C):   107.0 ml 52.31 ml/m  RA Volume:   48.20 ml  23.56 ml/m LA Vol (A4C):   53.4 ml  26.11 ml/m LA Biplane Vol: 82.8 ml  40.48 ml/m  AORTIC VALVE                     PULMONIC VALVE AV Area (Vmax):    2.50 cm      PV Vmax:       1.70 m/s AV Area (Vmean):   2.47 cm      PV Peak grad:  11.6 mmHg AV Area (VTI):     2.46 cm AV Vmax:           192.00 cm/s AV Vmean:          133.000 cm/s AV VTI:            0.429 m AV Peak Grad:      14.7 mmHg AV Mean Grad:      8.0 mmHg LVOT Vmax:         189.00 cm/s LVOT Vmean:        129.000 cm/s LVOT VTI:          0.415 m LVOT/AV VTI ratio: 0.97  AORTA Ao Root diam: 2.90 cm Ao Asc diam:  3.00 cm MITRAL VALVE MV Area (PHT): 2.39 cm     SHUNTS MV Area VTI:   5.12 cm     Systemic VTI:  0.42 m MV Peak grad:  4.0 mmHg     Systemic Diam: 1.80 cm MV Mean grad:  1.0 mmHg MV Vmax:       1.00 m/s MV Vmean:      52.4 cm/s MV Decel Time: 317 msec MV E velocity: 77.90 cm/s MV A velocity: 122.00 cm/s MV E/A ratio:  0.64 Epifanio Lesches MD Electronically signed by Epifanio Lesches MD Signature Date/Time: 12/11/2023/12:29:11 PM    Final (Updated)    DG Chest 2 View Result Date: 12/10/2023 CLINICAL DATA:  Cough and dyspnea. EXAM: CHEST - 2 VIEW COMPARISON:   11/11/2020 FINDINGS: Low volume film. The cardio pericardial silhouette is enlarged. There is pulmonary vascular congestion without overt pulmonary edema. Basilar atelectasis noted without overt edema or focal dense consolidative airspace disease. No acute bony abnormality. Telemetry leads overlie the chest. IMPRESSION:  Low volume film with pulmonary vascular congestion and basilar atelectasis. Electronically Signed   By: Kennith Center M.D.   On: 12/10/2023 10:58   Medications:  iron sucrose      amLODipine  10 mg Oral q AM   aspirin EC  81 mg Oral Daily   doxazosin  2 mg Oral QHS   heparin  5,000 Units Subcutaneous Q8H   insulin aspart  0-6 Units Subcutaneous TID WC   levofloxacin  500 mg Oral Q48H   loratadine  10 mg Oral Daily   pravastatin  40 mg Oral Daily   predniSONE  40 mg Oral Q breakfast   sodium bicarbonate  1,300 mg Oral BID   umeclidinium-vilanterol  1 puff Inhalation Daily    Assessment/PlanSusie Aguilar is an 68 y.o. female with CKD 5 secondary GPA (dx 2018, pulm and renal involvement), HTN, HL, tobacco, DM presenting with cough and nephrology is consulted for assistance in management of diuretics and CKD.    **Cough:  presenting with a few days of non productive cough, CXR suggested mild congestion. S/p 1 dose of IV lasix and is also being treated with steroids and nebs for poss COPD exacerbation.  Improving.  Appearing euvolemic, don't see need for more diuresis at this time.   Afebrile, no leukocytosis so doesn't seem infection.  Has significant h/o pulm GPA but no hemoptysis and CXR not consistent with DAH.     **CKD 5:  advanced CKD at baseline 07/2023 Cr 8.5, now 8.7.  labs stble.  Very mild uremic symptoms but doesn't appear to have clear indications for dialysis.  Attempted to discuss planning for likely RRT in near future and she is unwilling to discuss; admits she never went to [facility] consultation.  Will need nephrology f/u arranged at d/c --> she's very apprehensive.   We talked for a while and she's willing to see me in clinic in 4-6 weeks. I assured her we won't do dialysis if needed and will meet her where she is while advising of medical recommendations.  She would do dialysis if needed but isn't really willing to make advanced preps at this time.    **Anemia of CKD:  Hb trending down gradually, now into 8s without e/o bleeding.  Iron sat 11%, will dose with IV iron while in given poor f/u in recent past.  Holding ESA pending iron repletion - can arrange outpt.    **h/o GPA: pulm and renal involvement.  No clear active dz currently.  Last OV with pulm 07/2023 with 74mo f/u rec.  Her last dose of rituxan was 10/2022 per notes.    **HTN: home meds have been resumed, titrate as needed.    **DM    Estill Bakes MD 12/12/2023, 7:44 AM  [facility] Pager: [phone]

## 2023-12-12 NOTE — Discharge Summary (Addendum)
 Family [facility] Discharge Summary  Patient name: Catherine Aguilar Medical record number: [id number] Date of birth: 02/28/1956 Age: 68 y.o. Gender: female Date of Admission: 12/10/2023  Date of Discharge: 12/12/2023 Admitting Physician: Tomie China, MD  Primary Care Provider: Levin Erp, MD Consultants: Nephrology  Indication for Hospitalization: COPD exacerbation  Discharge Diagnoses/Problem List:  Principal Problem for Admission: COPD exacerbation Other Problems addressed during stay:  Principal Problem:   Cough Active Problems:   Hemoptysis   Granulomatosis with polyangiitis (HCC)   Chronic kidney disease (CKD), stage IV (severe) (HCC)   T2DM (type 2 diabetes mellitus) (HCC)   Volume overload   Anemia in chronic kidney disease   HTN (hypertension)   COPD exacerbation (HCC)   Positive RPR test  Brief Hospital Course:  Catherine Aguilar is a 68 y.o. female with PMHx CKDV, CVA, HLD, HTN, DM2, emphysema and granulomatosis with polyangiitis with pulmonary and renal involvement who was admitted to the [facility] Teaching Service at [facility] for dyspnea. Hospital course is outlined below by problem.   Acute on chronic cough Worsening cough and shortness of breath, most likely secondary to COPD exacerbation versus CHF exacerbation.  Received steroids and Levaquin with symptomatic improvement. Anoro added to regimen.  Overall appeared euvolemic but CXR showed pulmonary congestion and mildly elevated BNP therefore received Lasix 120 mg IV x 1.  Echo showed EF 60 to 65% with moderate LVH, no indication for further diuresis. Developed one small episode of hemoptysis 4/2 without hemodynamic changes. Ordered symptomatic management, no further episodes over the next 24 hours. By discharge, pulmonary status had improved.  Anemia in chronic kidney disease HgB 8.1 on admission, with transfusion threshold of <8.0 due to CAD. Did not require pRBC transfusion.  Received scheduled, monthly IV iron while inpatient. HgB stable at 8.3. ESA was not necessary.   RPR positive Likely false positive -- titer 1:1, negative. Awaiting T.palladum ab at discharge.  CKD, stage V Progressive in the setting of granulomatosis with polyangiitis. Cr elevated to 8.7 upon admission with mild uremic symptoms, nephrology consulted.  No urgent indication for dialysis, recommend continue nephrology follow-up at discharge.   Statin change Started atorvastatin 20 mg in setting of renal failure in place of pravastatin 40 mg (renally cleared).   Other conditions that were chronic and stable: HLD, CVA  Issues for follow up: Ensure continued nephrology follow-up for renal function/discussion of dialysis 2.   Ensure continued pulmonology follow-up for management/repeat PFTs 3. Follow up T. Pallidum ab 4. Follow up BP management  Disposition: home  Discharge Condition: Stable  Discharge Exam:  Vitals:   12/12/23 0446 12/12/23 0800  BP: (!) 172/79 (!) 155/82  Pulse: 64 79  Resp: 19 20  Temp: 98.7 F (37.1 C)   SpO2: 98% 100%   On interview, patient feels much better today than on admission. No new concerns. Had an episode of coughing this morning, resolved on albuterol x1 and AM meds. No hemoptysis. Feels fine now. Will follow up with nephrology outpatient.  Constitutional: older woman laying in bed, affable, no acute distress CV: RRR no MRG Resp: No increased work of breathing. RRR, no MRG) GI: No tenderness in four quadrants, BS+ Neuro: Grossly intact Psych: Appropriate mood and affect.  Significant Procedures: None  Significant Labs and Imaging:  Recent Labs  Lab 12/11/23 0623 12/12/23 0735  WBC 8.9 10.8*  HGB 8.0* 8.3*  HCT 24.9* 25.6*  PLT 228 266   Recent Labs  Lab 12/11/23 0623 12/12/23 0735  NA 135 137  K 4.2 3.5  CL 104 102  CO2 16* 17*  GLUCOSE 145* 118*  BUN 89* 99*  CREATININE 9.25* 8.97*  CALCIUM 8.8* 9.0  PHOS 5.0* 5.5*   ALBUMIN 3.1* 3.2*   XR chest (4/1):   IMPRESSION: Low volume film with pulmonary vascular congestion and basilar atelectasis.  Echocardiogram (4/2):  IMPRESSIONS    1. Left ventricular ejection fraction, by estimation, is 60 to 65%. The  left ventricle has normal function. The left ventricle has no regional  wall motion abnormalities. There is moderate left ventricular hypertrophy.  Left ventricular diastolic  parameters are indeterminate.   2. Right ventricular systolic function is normal. The right ventricular  size is normal.   3. Left atrial size was mildly dilated.   4. A small pericardial effusion is present.   5. The mitral valve is normal in structure. Trivial mitral valve  regurgitation. No evidence of mitral stenosis.   6. The aortic valve is tricuspid. Aortic valve regurgitation is not  visualized. Aortic valve sclerosis/calcification is present, without any  evidence of aortic stenosis.   7. The inferior vena cava is normal in size with greater than 50%  respiratory variability, suggesting right atrial pressure of 3 mmHg.   Results/Tests Pending at Time of Discharge: None  Discharge Medications:  Allergies as of 12/12/2023   No Known Allergies      Medication List     STOP taking these medications    pravastatin 40 MG tablet Commonly known as: PRAVACHOL       TAKE these medications    Accu-Chek Guide test strip Generic drug: glucose blood Check blood glucose prior to lantus administration   Accu-Chek Softclix Lancet Dev Kit Check BG prior to administering insulin   albuterol (2.5 MG/3ML) 0.083% nebulizer solution Commonly known as: PROVENTIL Take 3 mLs (2.5 mg total) by nebulization every 6 (six) hours as needed for wheezing or shortness of breath.   albuterol 108 (90 Base) MCG/ACT inhaler Commonly known as: VENTOLIN HFA INHALE 2 PUFFS BY MOUTH EVERY 4-6 HOURS   amLODipine 10 MG tablet Commonly known as: NORVASC Take 10 mg by mouth in the  morning.   aspirin EC 81 MG tablet TAKE 1 TABLET BY MOUTH EVERY DAY What changed: when to take this   atorvastatin 20 MG tablet Commonly known as: Lipitor Take 1 tablet (20 mg total) by mouth daily.   B-D UF III MINI PEN NEEDLES 31G X 5 MM Misc Generic drug: Insulin Pen Needle USE AS DIRECTED   doxazosin 2 MG tablet Commonly known as: CARDURA Take 2 mg by mouth at bedtime.   fluticasone 50 MCG/ACT nasal spray Commonly known as: FLONASE Place 2 sprays into both nostrils daily. What changed:  when to take this reasons to take this   guaiFENesin 100 MG/5ML liquid Commonly known as: ROBITUSSIN Take 5 mLs by mouth every 4 (four) hours as needed for cough or to loosen phlegm.   labetalol 300 MG tablet Commonly known as: NORMODYNE Take 300 mg by mouth 2 (two) times daily.   Lantus SoloStar 100 UNIT/ML Solostar Pen Generic drug: insulin glargine Inject 5 Units into the skin daily. What changed: when to take this   levofloxacin 500 MG tablet Commonly known as: LEVAQUIN Take 1 tablet (500 mg total) by mouth every other day. Start taking on: December 14, 2023   predniSONE 20 MG tablet Commonly known as: DELTASONE Take 2 tablets (40 mg total) by mouth daily with breakfast for  2 days. Start taking on: December 13, 2023   sodium bicarbonate 650 MG tablet Take 2 tablets (1,300 mg total) by mouth 2 (two) times daily. What changed:  how much to take when to take this additional instructions   umeclidinium-vilanterol 62.5-25 MCG/ACT Aepb Commonly known as: ANORO ELLIPTA Inhale 1 puff into the lungs daily.        Discharge Instructions: Please refer to Patient Instructions section of EMR for full details.  Patient was counseled important signs and symptoms that should prompt return to medical care, changes in medications, dietary instructions, activity restrictions, and follow up appointments.   Follow-Up Appointments:  Future Appointments  Date Time Provider Department  Center  12/17/2023  2:50 PM Levin Erp, MD [facility]  12/18/2023 12:45 PM [facility]-INJECTION ROOM [facility] None  01/21/2024 12:00 PM GI-[facility] DX DEXA 1 GI-[facility] GI-BREAST CE    Tomie China, MD 12/12/2023, 1:20 PM PGY-1, [facility]   I agree with the assessment and plan as documented above.  Janeal Holmes, MD PGY-2, [facility]

## 2023-12-14 ENCOUNTER — Telehealth: Payer: Self-pay | Admitting: Family Medicine

## 2023-12-14 NOTE — Telephone Encounter (Signed)
 Called patient to discuss RPR and T. pallidum antibodies collected in the hospital.  Confirmed date of birth.  RPR came back 1-1 titer.  Considered false positive of RPR in the context of her autoimmune disease (GPA); however, T. pallidum antibodies were also reactive.  Patient endorses no history or treatment of syphilis or other STIs.  No sexual partners with STIs that she knows of.  She has had recurrent boils around the genitalia though no ulcerations.  Discussed treatment with IM penicillin at next appointment with Dr. Laroy Apple on 12/17/23.  Patient understanding and would like treatment at that time.

## 2023-12-16 ENCOUNTER — Other Ambulatory Visit: Payer: Self-pay | Admitting: Student

## 2023-12-16 DIAGNOSIS — E119 Type 2 diabetes mellitus without complications: Secondary | ICD-10-CM

## 2023-12-17 ENCOUNTER — Encounter: Payer: Self-pay | Admitting: Student

## 2023-12-17 ENCOUNTER — Ambulatory Visit: Payer: Self-pay | Admitting: Student

## 2023-12-17 ENCOUNTER — Telehealth: Payer: Self-pay

## 2023-12-17 VITALS — BP 140/75 | HR 66 | Ht 67.0 in | Wt 202.8 lb

## 2023-12-17 DIAGNOSIS — N184 Chronic kidney disease, stage 4 (severe): Secondary | ICD-10-CM

## 2023-12-17 DIAGNOSIS — L0292 Furuncle, unspecified: Secondary | ICD-10-CM

## 2023-12-17 DIAGNOSIS — J4489 Other specified chronic obstructive pulmonary disease: Secondary | ICD-10-CM

## 2023-12-17 DIAGNOSIS — A530 Latent syphilis, unspecified as early or late: Secondary | ICD-10-CM

## 2023-12-17 DIAGNOSIS — I10 Essential (primary) hypertension: Secondary | ICD-10-CM | POA: Diagnosis not present

## 2023-12-17 MED ORDER — DOXYCYCLINE HYCLATE 100 MG PO TABS: 100.0000 mg | ORAL_TABLET | Freq: Two times a day (BID) | ORAL | 0 refills | Status: AC

## 2023-12-17 MED ORDER — PENICILLIN G BENZATHINE 1200000 UNIT/2ML IM SUSY
2.4000 10*6.[IU] | PREFILLED_SYRINGE | Freq: Once | INTRAMUSCULAR | Status: AC
  Administered 2023-12-17: 2.4 10*6.[IU] via INTRAMUSCULAR

## 2023-12-17 NOTE — Assessment & Plan Note (Signed)
 Positive RPR and treponemal antibody. RPR titer 1:1. Lesions do not appear to be chancres on exam and are painful. No prior treatment for syphilis. Informed consent obtained for precautionary treatment. - Administer penicillin G benzathine 2.4 million units intramuscularly once weekly for 3 weeks. - Recheck RPR titers at 6 months and 12 months and 24 months - Set up nurse visits for penicillin injections x 2 more weeks

## 2023-12-17 NOTE — Assessment & Plan Note (Signed)
 Respiratory status much improved.  -Patient informed to set follow up with pulmonology

## 2023-12-17 NOTE — Telephone Encounter (Signed)
-----   Message from [facility] sent at 12/17/2023  4:33 PM EDT ----- Regarding: Penicillin Pt will need Penicillin G 2.4 mil units IM for 2 doses (1 next week and 1 the week after that)  [facility]

## 2023-12-17 NOTE — Progress Notes (Signed)
    SUBJECTIVE:   CHIEF COMPLAINT / HPI: hospital fu + syphilis treatment  Discussed the use of AI scribe software for clinical note transcription with the patient, who gave verbal consent to proceed.  Admitted 4/1-4/3 for COPD vs CHF exacerbation. Tx with IV diuresis and steroids + levaquin.   Follow Up Recs: Ensure continued nephrology follow-up for renal function/discussion of dialysis 2.   Ensure continued pulmonology follow-up for management/repeat PFTs 3.   Follow up T. Pallidum ab (positive, will treat) 4.   Follow up BP management (on amlodipine 10 mg, labetalol 300 mg BID)  History of Present Illness The patient, with a history of chronic kidney disease and hypertension, presents with long-standing painful lesions in the genital area. The lesions have been present for years and cause significant discomfort. The patient also reports a history of similar lesions in the armpit area, which occurred approximately thirty years ago. The patient denies any other bumps or lesions elsewhere on the body. The patient's blood pressure is slightly elevated, despite being on amlodipine and labetalol. The patient also has a history of lung disease and is currently under the care of a pulmonologist and a nephrologist.  Feeling much improved after hospitalization.  PERTINENT  PMH / PSH: CVA, granulomatosis with polyangiitis, systemic vasculitis, COPD  OBJECTIVE:   BP (!) 140/75   Pulse 66   Ht 5\' 7"  (1.702 m)   Wt 202 lb 12.8 oz (92 kg)   SpO2 100%   BMI 31.76 kg/m   General: Well appearing, NAD, awake, alert, responsive to questions Head: Normocephalic atraumatic CV: Regular rate and rhythm no murmurs rubs or gallops Respiratory: Clear to ausculation bilaterally, no wheezes rales or crackles, chest rises symmetrically,  no increased work of breathing on RA Pelvic: VULVA: vulva with painful boils no overlyign erythema, VAGINA: Normal appearing vagina Chaperone Cleatrice Burke CMA present  for pelvic exam   ASSESSMENT/PLAN:   Assessment & Plan Positive RPR test Positive RPR and treponemal antibody. RPR titer 1:1. Lesions do not appear to be chancres on exam and are painful. No prior treatment for syphilis. Informed consent obtained for precautionary treatment. - Administer penicillin G benzathine 2.4 million units intramuscularly once weekly for 3 weeks. - Recheck RPR titers at 6 months and 12 months and 24 months - Set up nurse visits for penicillin injections x 2 more weeks Boils Chronic painful lesions in the genital area consistent with possible hidradenitis suppurativa. Differential diagnosis considered syphilis. - Prescribe doxycycline for 5 days Primary hypertension Blood pressure slightly elevated with chronic kidney disease complicating management. Currently on amlodipine 10 mg and labetalol 200 mg BID. - Consider adding hydralazine if blood pressure remains elevated. Asthma with COPD (chronic obstructive pulmonary disease) (HCC) Respiratory status much improved.  -Patient informed to set follow up with pulmonology Chronic kidney disease (CKD), stage IV (severe) (HCC) Chronic kidney disease approaching dialysis.  - Discussed follow up with nephrology for management   Levin Erp, MD [facility]

## 2023-12-17 NOTE — Patient Instructions (Addendum)
 It was great to see you! Thank you for allowing me to participate in your care!   Our plans for today:  - We will do penicillin weekly shots for 3 weeks and then recheck your RPR titer at certain times during the year (6, 12, and 24 months) - We will prescribe doxycycline for the boils for 5 days - nephrology follow-up for renal function/discussion of dialysis - pulmonology follow-up for management/repeat PFTs  Take care and seek immediate care sooner if you develop any concerns.  Levin Erp, MD

## 2023-12-17 NOTE — Telephone Encounter (Signed)
 Patient has been scheduled for 2 appt to receive pencillin injections.

## 2023-12-17 NOTE — Assessment & Plan Note (Signed)
 Chronic kidney disease approaching dialysis.  - Discussed follow up with nephrology for management

## 2023-12-18 ENCOUNTER — Encounter (HOSPITAL_COMMUNITY)

## 2023-12-20 ENCOUNTER — Other Ambulatory Visit: Payer: Self-pay

## 2023-12-20 ENCOUNTER — Encounter (HOSPITAL_COMMUNITY): Payer: Self-pay

## 2023-12-20 ENCOUNTER — Emergency Department (HOSPITAL_COMMUNITY)
Admission: EM | Admit: 2023-12-20 | Discharge: 2023-12-20 | Disposition: A | Discharge status: Home / Self Care | Attending: Emergency Medicine | Admitting: Emergency Medicine

## 2023-12-20 ENCOUNTER — Emergency Department (HOSPITAL_COMMUNITY)

## 2023-12-20 DIAGNOSIS — Z79899 Other long term (current) drug therapy: Secondary | ICD-10-CM | POA: Insufficient documentation

## 2023-12-20 DIAGNOSIS — J441 Chronic obstructive pulmonary disease with (acute) exacerbation: Secondary | ICD-10-CM | POA: Diagnosis not present

## 2023-12-20 DIAGNOSIS — R0602 Shortness of breath: Secondary | ICD-10-CM | POA: Diagnosis present

## 2023-12-20 DIAGNOSIS — Z794 Long term (current) use of insulin: Secondary | ICD-10-CM | POA: Insufficient documentation

## 2023-12-20 DIAGNOSIS — Z7982 Long term (current) use of aspirin: Secondary | ICD-10-CM | POA: Diagnosis not present

## 2023-12-20 DIAGNOSIS — Z7951 Long term (current) use of inhaled steroids: Secondary | ICD-10-CM | POA: Diagnosis not present

## 2023-12-20 LAB — COMPREHENSIVE METABOLIC PANEL WITH GFR
ALT: 21 U/L (ref 0–44)
AST: 21 U/L (ref 15–41)
Albumin: 3.1 g/dL — ABNORMAL LOW (ref 3.5–5.0)
Alkaline Phosphatase: 56 U/L (ref 38–126)
Anion gap: 16 — ABNORMAL HIGH (ref 5–15)
BUN: 139 mg/dL — ABNORMAL HIGH (ref 8–23)
CO2: 17 mmol/L — ABNORMAL LOW (ref 22–32)
Calcium: 8.6 mg/dL — ABNORMAL LOW (ref 8.9–10.3)
Chloride: 104 mmol/L (ref 98–111)
Creatinine, Ser: 9.24 mg/dL — ABNORMAL HIGH (ref 0.44–1.00)
GFR, Estimated: 4 mL/min — ABNORMAL LOW (ref >60)
Glucose, Bld: 98 mg/dL (ref 70–99)
Potassium: 5.4 mmol/L — ABNORMAL HIGH (ref 3.5–5.1)
Sodium: 137 mmol/L (ref 135–145)
Total Bilirubin: 0.6 mg/dL (ref 0.0–1.2)
Total Protein: 6.1 g/dL — ABNORMAL LOW (ref 6.5–8.1)

## 2023-12-20 LAB — CBC WITH DIFFERENTIAL/PLATELET
Abs Immature Granulocytes: 0.11 10*3/uL — ABNORMAL HIGH (ref 0.00–0.07)
Basophils Absolute: 0 10*3/uL (ref 0.0–0.1)
Basophils Relative: 0 %
Eosinophils Absolute: 0.2 10*3/uL (ref 0.0–0.5)
Eosinophils Relative: 2 %
HCT: 25.1 % — ABNORMAL LOW (ref 36.0–46.0)
Hemoglobin: 8 g/dL — ABNORMAL LOW (ref 12.0–15.0)
Immature Granulocytes: 1 %
Lymphocytes Relative: 15 %
Lymphs Abs: 1.7 10*3/uL (ref 0.7–4.0)
MCH: 25.8 pg — ABNORMAL LOW (ref 26.0–34.0)
MCHC: 31.9 g/dL (ref 30.0–36.0)
MCV: 81 fL (ref 80.0–100.0)
Monocytes Absolute: 0.7 10*3/uL (ref 0.1–1.0)
Monocytes Relative: 7 %
Neutro Abs: 8.4 10*3/uL — ABNORMAL HIGH (ref 1.7–7.7)
Neutrophils Relative %: 75 %
Platelets: 218 10*3/uL (ref 150–400)
RBC: 3.1 MIL/uL — ABNORMAL LOW (ref 3.87–5.11)
RDW: 16.9 % — ABNORMAL HIGH (ref 11.5–15.5)
WBC: 11.2 10*3/uL — ABNORMAL HIGH (ref 4.0–10.5)
nRBC: 0 % (ref 0.0–0.2)

## 2023-12-20 LAB — RESP PANEL BY RT-PCR (RSV, FLU A&B, COVID)  RVPGX2
Influenza A by PCR: NEGATIVE
Influenza B by PCR: NEGATIVE
Resp Syncytial Virus by PCR: NEGATIVE
SARS Coronavirus 2 by RT PCR: NEGATIVE

## 2023-12-20 LAB — BRAIN NATRIURETIC PEPTIDE: B Natriuretic Peptide: 96.7 pg/mL (ref 0.0–100.0)

## 2023-12-20 LAB — TROPONIN I (HIGH SENSITIVITY): Troponin I (High Sensitivity): 15 ng/L (ref <18)

## 2023-12-20 MED ORDER — IPRATROPIUM-ALBUTEROL 0.5-2.5 (3) MG/3ML IN SOLN: 3.0000 mL | RESPIRATORY_TRACT | Status: AC

## 2023-12-20 MED ORDER — PREDNISONE 20 MG PO TABS: ORAL_TABLET | ORAL | 0 refills | Status: DC

## 2023-12-20 MED ORDER — MAGNESIUM SULFATE 2 GM/50ML IV SOLN
2.0000 g | Freq: Once | INTRAVENOUS | Status: AC
  Administered 2023-12-20: 2 g via INTRAVENOUS
  Filled 2023-12-20: qty 50

## 2023-12-20 NOTE — ED Provider Notes (Signed)
 [place] EMERGENCY DEPARTMENT AT [facility] Provider Note   CSN: [id number] Arrival date & time: 12/20/23  0325     History  Chief Complaint  Patient presents with   Shortness of Breath    Catherine Aguilar is a 68 y.o. female.  68 yo F with a cc of sob.  Patient has been coughing for the past few months.  She feels like her symptoms got a little bit worse over the past week.  Feels like she struggles to breathe at times.  She does feel a bit better after breathing medicine with EMS and steroids.  Has some chest pain with coughing.   Shortness of Breath      Home Medications Prior to Admission medications   Medication Sig Start Date End Date Taking? Authorizing Provider  albuterol (VENTOLIN HFA) 108 (90 Base) MCG/ACT inhaler INHALE 2 PUFFS BY MOUTH EVERY 4-6 HOURS 04/18/23  Yes Levin Erp, MD  amLODipine (NORVASC) 10 MG tablet Take 10 mg by mouth in the morning. 12/06/23  Yes [provider]  aspirin 81 MG EC tablet TAKE 1 TABLET BY MOUTH EVERY DAY Patient taking differently: Take 81 mg by mouth in the morning. 08/09/20  Yes Peggyann Shoals C, DO  atorvastatin (LIPITOR) 20 MG tablet Take 1 tablet (20 mg total) by mouth daily. 12/12/23  Yes Mabe, Earvin Hansen, MD  doxazosin (CARDURA) 2 MG tablet Take 2 mg by mouth at bedtime. 10/31/23  Yes [provider]  doxycycline (VIBRA-TABS) 100 MG tablet Take 1 tablet (100 mg total) by mouth 2 (two) times daily for 5 days. 12/17/23 12/22/23 Yes Levin Erp, MD  fluticasone (FLONASE) 50 MCG/ACT nasal spray Place 2 sprays into both nostrils daily. Patient taking differently: Place 2 sprays into both nostrils as needed for allergies. 09/14/19  Yes Glenford Bayley, NP  guaiFENesin (ROBITUSSIN) 100 MG/5ML liquid Take 5 mLs by mouth every 4 (four) hours as needed for cough or to loosen phlegm. 12/12/23  Yes Mabe, Earvin Hansen, MD  insulin glargine (LANTUS SOLOSTAR) 100 UNIT/ML Solostar Pen Inject 5 Units into the skin  daily. Patient taking differently: Inject 5 Units into the skin at bedtime. 05/14/23  Yes Levin Erp, MD  labetalol (NORMODYNE) 300 MG tablet Take 300 mg by mouth 2 (two) times daily. 12/06/23  Yes [provider]  predniSONE (DELTASONE) 20 MG tablet 2 tabs po daily x 4 days 12/20/23  Yes Melene Plan, DO  umeclidinium-vilanterol (ANORO ELLIPTA) 62.5-25 MCG/ACT AEPB Inhale 1 puff into the lungs daily. 12/12/23  Yes Mabe, Earvin Hansen, MD  albuterol (PROVENTIL) (2.5 MG/3ML) 0.083% nebulizer solution Take 3 mLs (2.5 mg total) by nebulization every 6 (six) hours as needed for wheezing or shortness of breath. Patient not taking: Reported on 12/20/2023 11/06/20   Peggyann Shoals C, DO  B-D UF III MINI PEN NEEDLES 31G X 5 MM MISC USE AS DIRECTED 12/16/23   Levin Erp, MD  glucose blood (ACCU-CHEK GUIDE) test strip Check blood glucose prior to lantus administration 04/18/23   Levin Erp, MD  Lancets Misc. (ACCU-CHEK SOFTCLIX LANCET DEV) KIT Check BG prior to administering insulin 04/18/23   Levin Erp, MD  levofloxacin (LEVAQUIN) 500 MG tablet Take 1 tablet (500 mg total) by mouth every other day. Patient not taking: Reported on 12/20/2023 12/14/23   Evette Georges, MD  sodium bicarbonate 650 MG tablet Take 2 tablets (1,300 mg total) by mouth 2 (two) times daily. Patient taking differently: Take 650-1,300 mg by mouth See admin instructions. Take 1 tablet (  650mg ) by mouth every morning, then take 1 tablet (650mg ) by mouth at noon, then take 2 tablets (1300mg ) by mouth everyday at bedtime. 11/06/20   Dollene Cleveland, DO      Allergies    Patient has no known allergies.    Review of Systems   Review of Systems  Respiratory:  Positive for shortness of breath.     Physical Exam Updated Vital Signs BP (!) 143/69 (BP Location: Right Arm)   Pulse (!) 56   Resp (!) 22   Ht 5\' 7"  (1.702 m)   Wt 90.3 kg   SpO2 100%   BMI 31.17 kg/m  Physical Exam Vitals and nursing note reviewed.   Constitutional:      General: She is not in acute distress.    Appearance: She is well-developed. She is not diaphoretic.  HENT:     Head: Normocephalic and atraumatic.  Eyes:     Pupils: Pupils are equal, round, and reactive to light.  Cardiovascular:     Rate and Rhythm: Normal rate and regular rhythm.     Heart sounds: No murmur heard.    No friction rub. No gallop.  Pulmonary:     Effort: Pulmonary effort is normal.     Breath sounds: No wheezing or rales.     Comments: Coarse breath sounds in all fields with prolonged expiratory effort. Abdominal:     General: There is no distension.     Palpations: Abdomen is soft.     Tenderness: There is no abdominal tenderness.  Musculoskeletal:        General: No tenderness.     Cervical back: Normal range of motion and neck supple.  Skin:    General: Skin is warm and dry.  Neurological:     Mental Status: She is alert and oriented to person, place, and time.  Psychiatric:        Behavior: Behavior normal.     ED Results / Procedures / Treatments   Labs (all labs ordered are listed, but only abnormal results are displayed) Labs Reviewed  CBC WITH DIFFERENTIAL/PLATELET - Abnormal; Notable for the following components:      Result Value   WBC 11.2 (*)    RBC 3.10 (*)    Hemoglobin 8.0 (*)    HCT 25.1 (*)    MCH 25.8 (*)    RDW 16.9 (*)    Neutro Abs 8.4 (*)    Abs Immature Granulocytes 0.11 (*)    All other components within normal limits  COMPREHENSIVE METABOLIC PANEL WITH GFR - Abnormal; Notable for the following components:   Potassium 5.4 (*)    CO2 17 (*)    BUN 139 (*)    Creatinine, Ser 9.24 (*)    Calcium 8.6 (*)    Total Protein 6.1 (*)    Albumin 3.1 (*)    GFR, Estimated 4 (*)    Anion gap 16 (*)    All other components within normal limits  RESP PANEL BY RT-PCR (RSV, FLU A&B, COVID)  RVPGX2  BRAIN NATRIURETIC PEPTIDE  TROPONIN I (HIGH SENSITIVITY)    EKG EKG Interpretation Date/Time:  Friday December 20 2023 03:31:39 EDT Ventricular Rate:  57 PR Interval:  150 QRS Duration:  103 QT Interval:  448 QTC Calculation: 437 R Axis:   -22  Text Interpretation: Sinus rhythm Left atrial enlargement Left ventricular hypertrophy No significant change since last tracing Confirmed by Melene Plan [phone]) on 12/20/2023 4:24:28 AM  Radiology  DG Chest Port 1 View Result Date: 12/20/2023 CLINICAL DATA:  Shortness of breath EXAM: PORTABLE CHEST 1 VIEW COMPARISON:  Ten days prior FINDINGS: Chronic cardiomegaly. Generalized prominence of lung markings, suspect vascular congestion. No Kerley lines, air bronchogram, effusion, or pneumothorax. IMPRESSION: Cardiomegaly and vascular congestion which is stable compared to recent prior. Electronically Signed   By: Tiburcio Pea M.D.   On: 12/20/2023 04:32    Procedures Procedures    Medications Ordered in ED Medications  ipratropium-albuterol (DUONEB) 0.5-2.5 (3) MG/3ML nebulizer solution 3 mL (3 mLs Nebulization Not Given 12/20/23 0435)  magnesium sulfate IVPB 2 g 50 mL (2 g Intravenous New Bag/Given 12/20/23 0418)    ED Course/ Medical Decision Making/ A&P                                 Medical Decision Making Amount and/or Complexity of Data Reviewed Labs: ordered. Radiology: ordered.  Risk Prescription drug management.   68 yo F with a chief complaints of cough and difficulty breathing.  Ongoing for a long time per the patient.  Worsening over the past week or so.  Will give DuoNebs here.  Magnesium.  Lab eval, chest x-ray reassess.  Renal function slightly worse than baseline.  BUN has increased.  Potassium mildly elevated at 5.4.  No acute anemia.  Chest x-ray independently interpreted by me without focal infiltrate or pneumothorax.  COVID flu and RSV are negative.  Troponin negative.  BNP negative.  I discussed results with patient. She is feeling better and would like to go home.  PCP follow-up.  5:53 AM:  I have discussed the  diagnosis/risks/treatment options with the patient.  Evaluation and diagnostic testing in the emergency department does not suggest an emergent condition requiring admission or immediate intervention beyond what has been performed at this time.  They will follow up with PCP. We also discussed returning to the ED immediately if new or worsening sx occur. We discussed the sx which are most concerning (e.g., sudden worsening pain, fever, inability to tolerate by mouth, worsening sob, need to use home beta agonist therapy more often than every 4 hours) that necessitate immediate return. Medications administered to the patient during their visit and any new prescriptions provided to the patient are listed below.  Medications given during this visit Medications  ipratropium-albuterol (DUONEB) 0.5-2.5 (3) MG/3ML nebulizer solution 3 mL (3 mLs Nebulization Not Given 12/20/23 0435)  magnesium sulfate IVPB 2 g 50 mL (2 g Intravenous New Bag/Given 12/20/23 0418)     The patient appears reasonably screen and/or stabilized for discharge and I doubt any other medical condition or other [facility] requiring further screening, evaluation, or treatment in the ED at this time prior to discharge.          Final Clinical Impression(s) / ED Diagnoses Final diagnoses:  COPD exacerbation (HCC)    Rx / DC Orders ED Discharge Orders          Ordered    predniSONE (DELTASONE) 20 MG tablet        12/20/23 0539              Melene Plan, DO 12/20/23 [phone]

## 2023-12-20 NOTE — Discharge Instructions (Signed)
 Use your inhaler every 4 hours(6 puffs) while awake, return for sudden worsening shortness of breath, or if you need to use your inhaler more often.   Follow up with your family doc in the office.

## 2023-12-20 NOTE — ED Triage Notes (Signed)
 Pt complaining of shortness of breath for the last week since being released from the hospital. Today she got worse. Ems gave 125 solumedrol and a duo neb

## 2023-12-23 ENCOUNTER — Telehealth: Payer: Self-pay

## 2023-12-23 NOTE — Telephone Encounter (Signed)
 Patient calls nurse line requesting a prescription for Lidocaine Patches.   She reports she will be uses these on her back to help alleviate the back pain.   Lidocaine 5% can be sent in as a prescription.   Will forward to PCP.

## 2023-12-24 ENCOUNTER — Ambulatory Visit

## 2023-12-24 ENCOUNTER — Telehealth: Payer: Self-pay

## 2023-12-24 VITALS — HR 62

## 2023-12-24 DIAGNOSIS — A530 Latent syphilis, unspecified as early or late: Secondary | ICD-10-CM

## 2023-12-24 MED ORDER — PENICILLIN G BENZATHINE 1200000 UNIT/2ML IM SUSY
1.2000 10*6.[IU] | PREFILLED_SYRINGE | Freq: Once | INTRAMUSCULAR | Status: AC
  Administered 2023-12-24: 1.2 10*6.[IU] via INTRAMUSCULAR

## 2023-12-24 MED ORDER — LIDOCAINE 5 % EX PTCH: 1.0000 | MEDICATED_PATCH | CUTANEOUS | 0 refills | Status: DC

## 2023-12-24 NOTE — Telephone Encounter (Signed)
 Pharmacy Patient Advocate Encounter   Received notification from CoverMyMeds that prior authorization for LIDOCAINE 5% PATCHES is required/requested.   Insurance verification completed.   The patient is insured through [facility] .   PA required; PA submitted to above mentioned insurance via CoverMyMeds Key/confirmation #/EOC [id number]. Status is pending

## 2023-12-24 NOTE — Progress Notes (Signed)
 Patient presents to nurse clinic for treatment of Syphilis. Upon arrival to room, patient had audible wheezing. Patient seen in the ED for COPD exacerbation on 12/20/23. She was started on prednisone at this visit.    O2 saturation 95%.   Proceeded with administration of Penicillin 1.2 million units of penicillin in RUOQ and 1.2 million units of penicillin in LUOQ. Patient tolerated well, sites unremarkable. Patient observed 15 minutes post injection, no adverse reaction noted.   We do not have any same day clinic availability for patient to see provider. Spoke with Dr. Rumball regarding patient. Recommended scheduling follow up in office for this week. Patient requests to schedule for next week as she needs to schedule transportation.   Repeat O2 saturation was 96%. Patient's wheezing had improved with rest. Also advised patient that per Dr. Rumball, she could be taking albuterol inhaler scheduled every 4-6 hours.   Scheduled patient with Dr. Leocadia Rains on 12/30/23. ED precautions discussed.   Elsie Halo, RN

## 2023-12-25 NOTE — Telephone Encounter (Signed)
 Pharmacy Patient Advocate Encounter  Received notification from [facility] that Prior Authorization for LIDOCAINE 5% PATCHES has been DENIED.  Full denial letter will be uploaded to the media tab. See denial reason below.  LIDOCAINE PAD 5% is not FDA approved for your medical condition(s): Back pain. These condition(s) are not supported by one of the accepted references. Therefore your drug is denied because it is not being used for a "medically accepted indication."  4% PATCHES AVAILABLE OTC  PA #/Case ID/Reference #: [id number]

## 2023-12-30 ENCOUNTER — Ambulatory Visit (INDEPENDENT_AMBULATORY_CARE_PROVIDER_SITE_OTHER): Admitting: Student

## 2023-12-30 ENCOUNTER — Encounter: Payer: Self-pay | Admitting: Student

## 2023-12-30 VITALS — BP 148/72 | HR 61 | Temp 98.0°F | Ht 67.0 in | Wt 200.4 lb

## 2023-12-30 DIAGNOSIS — R1011 Right upper quadrant pain: Secondary | ICD-10-CM

## 2023-12-30 NOTE — Progress Notes (Signed)
    SUBJECTIVE:   CHIEF COMPLAINT / HPI: Right upper abdominal pain  Discussed the use of AI scribe software for clinical note transcription with the patient, who gave verbal consent to proceed.  History of Present Illness The patient presents with a 3-4 week history of right-sided abdominal pain that radiates from the right upper. The pain is described as sharp and is worse at night when lying down flat. The pain comes and goes and does not seem to be related to eating. The patient denies any previous abdominal surgeries. There is no associated nausea or vomiting. The patient also reports no BM changes (last BM yesterday normal). The patient denies any recent trauma or fever.   PERTINENT  PMH / PSH: CVA, HTN, granulomatosis polyangiitis  OBJECTIVE:   BP (!) 148/72   Pulse 61   Temp 98 F (36.7 C)   Ht 5\' 7"  (1.702 m)   Wt 200 lb 6 oz (90.9 kg)   SpO2 99%   BMI 31.38 kg/m   General: Well appearing, NAD, awake, alert, responsive to questions Head: Normocephalic atraumatic CV: Regular rate and rhythm no murmurs rubs or gallops Respiratory: Clear to ausculation bilaterally, intermittent wheeze, chest rises symmetrically,  no increased work of breathing Abdomen: Soft, distended, no fluid wave, +murphys sign, unable to lay flat in bed due to RUQ pain, normoactive bowel sounds, no epigastric tenderness Extremities: Moves upper and lower extremities freely  ASSESSMENT/PLAN:   Assessment & Plan RUQ pain Persistent sharp right upper quadrant pain with positive Murphy's sign. Differential includes gallstones, abscess, MSK sxs.  - Order STAT ultrasound of RUQ - Order CMP and CBC - Refer to general surgery for evaluation and potential cholecystectomy if indicated. - Advised emergency care for fevers, vomiting, or worsening pain.   Catherine Kidd, MD [facility]

## 2023-12-30 NOTE — Patient Instructions (Signed)
 It was great to see you! Thank you for allowing me to participate in your care!   Our plans for today:  - I am ordering a STAT ultrasound of your gallbladder/liver - I am ordering labs - I am placing referral to general surgery in meantime - If any fevers, vomiting, shortness of breath please return to care earlier  Take care and seek immediate care sooner if you develop any concerns.  Genora Kidd, MD

## 2023-12-31 ENCOUNTER — Ambulatory Visit (HOSPITAL_BASED_OUTPATIENT_CLINIC_OR_DEPARTMENT_OTHER)
Admission: RE | Admit: 2023-12-31 | Discharge: 2023-12-31 | Disposition: A | Discharge status: Home / Self Care | Source: Ambulatory Visit | Attending: Family Medicine | Admitting: Family Medicine

## 2023-12-31 ENCOUNTER — Ambulatory Visit

## 2023-12-31 ENCOUNTER — Telehealth: Payer: Self-pay | Admitting: Student

## 2023-12-31 DIAGNOSIS — N184 Chronic kidney disease, stage 4 (severe): Secondary | ICD-10-CM

## 2023-12-31 DIAGNOSIS — R1011 Right upper quadrant pain: Secondary | ICD-10-CM | POA: Diagnosis present

## 2023-12-31 DIAGNOSIS — A530 Latent syphilis, unspecified as early or late: Secondary | ICD-10-CM

## 2023-12-31 LAB — CBC
Hematocrit: 25.7 % — ABNORMAL LOW (ref 34.0–46.6)
Hemoglobin: 8.2 g/dL — ABNORMAL LOW (ref 11.1–15.9)
MCH: 25.7 pg — ABNORMAL LOW (ref 26.6–33.0)
MCHC: 31.9 g/dL (ref 31.5–35.7)
MCV: 81 fL (ref 79–97)
Platelets: 235 10*3/uL (ref 150–450)
RBC: 3.19 x10E6/uL — ABNORMAL LOW (ref 3.77–5.28)
RDW: 16.4 % — ABNORMAL HIGH (ref 11.7–15.4)
WBC: 7.8 10*3/uL (ref 3.4–10.8)

## 2023-12-31 LAB — COMPREHENSIVE METABOLIC PANEL WITH GFR
ALT: 34 IU/L — ABNORMAL HIGH (ref 0–32)
AST: 18 IU/L (ref 0–40)
Albumin: 3.9 g/dL (ref 3.9–4.9)
Alkaline Phosphatase: 89 IU/L (ref 44–121)
BUN/Creatinine Ratio: 11 — ABNORMAL LOW (ref 12–28)
BUN: 105 mg/dL (ref 8–27)
Bilirubin Total: 0.2 mg/dL (ref 0.0–1.2)
CO2: 16 mmol/L — ABNORMAL LOW (ref 20–29)
Calcium: 9 mg/dL (ref 8.7–10.3)
Chloride: 106 mmol/L (ref 96–106)
Creatinine, Ser: 9.24 mg/dL — ABNORMAL HIGH (ref 0.57–1.00)
Globulin, Total: 2.3 g/dL (ref 1.5–4.5)
Glucose: 87 mg/dL (ref 70–99)
Potassium: 6.1 mmol/L — ABNORMAL HIGH (ref 3.5–5.2)
Sodium: 141 mmol/L (ref 134–144)
Total Protein: 6.2 g/dL (ref 6.0–8.5)
eGFR: 4 mL/min/{1.73_m2} — ABNORMAL LOW (ref >59)

## 2023-12-31 MED ORDER — PENICILLIN G BENZATHINE 1200000 UNIT/2ML IM SUSY
1.2000 10*6.[IU] | PREFILLED_SYRINGE | Freq: Once | INTRAMUSCULAR | Status: AC
  Administered 2023-12-31: 1.2 10*6.[IU] via INTRAMUSCULAR

## 2023-12-31 MED ORDER — LOKELMA 5 G PO PACK
10.0000 g | PACK | Freq: Every day | ORAL | 0 refills | Status: AC
Start: 2023-12-31 — End: 2024-01-03

## 2023-12-31 NOTE — Addendum Note (Signed)
 Addended by: Kandis Ormond on: 12/31/2023 04:55 PM   Modules accepted: Orders

## 2023-12-31 NOTE — Telephone Encounter (Addendum)
 Called patient and confirmed DOB. Discussed RUQ u/s is normal, will cancel general surgery referral. Continues to have significant pain, will order non contrast CT. Potassium elevated to 6.1 - I will send in lokelma  to lower and she will come back on Tuesday for recheck, ED precautions discussed. Informed her to schedule appt with her nephrologist as may be close to HD initiation. Pended future BMP.   Plan discussed with Drs. Andree Kayser and Rumball

## 2023-12-31 NOTE — Addendum Note (Signed)
 Addended by: Kashden Deboy on: 12/31/2023 06:08 PM   Modules accepted: Orders

## 2023-12-31 NOTE — Progress Notes (Signed)
 Patient presents to nurse clinic for treatment of Syphilis.   Administered 1.2 million units of penicillin  in RUOQ and 1.2 million units of penicillin  in LUOQ. Patient tolerated well, sites unremarkable. Patient observed 15 minutes post injection, no adverse reaction noted.   Advised patient to follow up in six months for repeat testing.   Patient voices understanding.   Elsie Halo, RN

## 2024-01-02 ENCOUNTER — Telehealth: Payer: Self-pay

## 2024-01-02 NOTE — Telephone Encounter (Signed)
-----   Message from [facility] sent at 01/02/2024 11:46 AM EDT -----  ----- Message ----- From: Nathan Bake, CMA Sent: 01/02/2024  11:31 AM EDT To: Genora Kidd, MD  Ok to schedule CT at [facility].  Thanks! Clovis Dar

## 2024-01-02 NOTE — Telephone Encounter (Signed)
 Scheduled patient CT for 05/02 @1030  am. Patient has been notified.

## 2024-01-07 ENCOUNTER — Other Ambulatory Visit

## 2024-01-07 DIAGNOSIS — N184 Chronic kidney disease, stage 4 (severe): Secondary | ICD-10-CM

## 2024-01-08 ENCOUNTER — Encounter: Payer: Self-pay | Admitting: Student

## 2024-01-08 LAB — BASIC METABOLIC PANEL WITH GFR
BUN/Creatinine Ratio: 9 — ABNORMAL LOW (ref 12–28)
BUN: 89 mg/dL (ref 8–27)
CO2: 16 mmol/L — ABNORMAL LOW (ref 20–29)
Calcium: 8.2 mg/dL — ABNORMAL LOW (ref 8.7–10.3)
Chloride: 102 mmol/L (ref 96–106)
Creatinine, Ser: 10.1 mg/dL — ABNORMAL HIGH (ref 0.57–1.00)
Glucose: 127 mg/dL — ABNORMAL HIGH (ref 70–99)
Potassium: 4.3 mmol/L (ref 3.5–5.2)
Sodium: 138 mmol/L (ref 134–144)
eGFR: 4 mL/min/{1.73_m2} — ABNORMAL LOW (ref >59)

## 2024-01-10 ENCOUNTER — Ambulatory Visit (HOSPITAL_COMMUNITY): Attending: Family Medicine

## 2024-01-15 ENCOUNTER — Ambulatory Visit (HOSPITAL_COMMUNITY)
Admission: RE | Admit: 2024-01-15 | Discharge: 2024-01-15 | Disposition: A | Discharge status: Home / Self Care | Source: Ambulatory Visit | Attending: Internal Medicine | Admitting: Internal Medicine

## 2024-01-15 ENCOUNTER — Inpatient Hospital Stay (HOSPITAL_COMMUNITY): Admission: RE | Admit: 2024-01-15 | Source: Ambulatory Visit

## 2024-01-15 ENCOUNTER — Other Ambulatory Visit: Payer: Self-pay

## 2024-01-15 ENCOUNTER — Emergency Department (HOSPITAL_COMMUNITY)
Admission: EM | Admit: 2024-01-15 | Discharge: 2024-01-15 | Disposition: A | Discharge status: Home / Self Care | Attending: Emergency Medicine | Admitting: Emergency Medicine

## 2024-01-15 ENCOUNTER — Encounter (HOSPITAL_COMMUNITY): Payer: Self-pay

## 2024-01-15 VITALS — BP 143/75 | HR 65 | Temp 98.9°F | Resp 20

## 2024-01-15 DIAGNOSIS — D649 Anemia, unspecified: Secondary | ICD-10-CM | POA: Diagnosis not present

## 2024-01-15 DIAGNOSIS — J449 Chronic obstructive pulmonary disease, unspecified: Secondary | ICD-10-CM | POA: Diagnosis not present

## 2024-01-15 DIAGNOSIS — Z7951 Long term (current) use of inhaled steroids: Secondary | ICD-10-CM | POA: Insufficient documentation

## 2024-01-15 DIAGNOSIS — N185 Chronic kidney disease, stage 5: Secondary | ICD-10-CM | POA: Insufficient documentation

## 2024-01-15 DIAGNOSIS — R0602 Shortness of breath: Secondary | ICD-10-CM | POA: Diagnosis present

## 2024-01-15 DIAGNOSIS — Z79899 Other long term (current) drug therapy: Secondary | ICD-10-CM | POA: Insufficient documentation

## 2024-01-15 DIAGNOSIS — I129 Hypertensive chronic kidney disease with stage 1 through stage 4 chronic kidney disease, or unspecified chronic kidney disease: Secondary | ICD-10-CM | POA: Insufficient documentation

## 2024-01-15 DIAGNOSIS — Z794 Long term (current) use of insulin: Secondary | ICD-10-CM | POA: Diagnosis not present

## 2024-01-15 DIAGNOSIS — D631 Anemia in chronic kidney disease: Secondary | ICD-10-CM | POA: Diagnosis not present

## 2024-01-15 DIAGNOSIS — N189 Chronic kidney disease, unspecified: Secondary | ICD-10-CM | POA: Insufficient documentation

## 2024-01-15 DIAGNOSIS — Z7982 Long term (current) use of aspirin: Secondary | ICD-10-CM | POA: Insufficient documentation

## 2024-01-15 LAB — CBC WITH DIFFERENTIAL/PLATELET
Abs Immature Granulocytes: 0.08 10*3/uL — ABNORMAL HIGH (ref 0.00–0.07)
Basophils Absolute: 0 10*3/uL (ref 0.0–0.1)
Basophils Relative: 0 %
Eosinophils Absolute: 0.2 10*3/uL (ref 0.0–0.5)
Eosinophils Relative: 2 %
HCT: 21.7 % — ABNORMAL LOW (ref 36.0–46.0)
Hemoglobin: 6.7 g/dL — CL (ref 12.0–15.0)
Immature Granulocytes: 1 %
Lymphocytes Relative: 15 %
Lymphs Abs: 1.2 10*3/uL (ref 0.7–4.0)
MCH: 25.4 pg — ABNORMAL LOW (ref 26.0–34.0)
MCHC: 30.9 g/dL (ref 30.0–36.0)
MCV: 82.2 fL (ref 80.0–100.0)
Monocytes Absolute: 0.8 10*3/uL (ref 0.1–1.0)
Monocytes Relative: 10 %
Neutro Abs: 5.5 10*3/uL (ref 1.7–7.7)
Neutrophils Relative %: 72 %
Platelets: 375 10*3/uL (ref 150–400)
RBC: 2.64 MIL/uL — ABNORMAL LOW (ref 3.87–5.11)
RDW: 16.1 % — ABNORMAL HIGH (ref 11.5–15.5)
WBC: 7.8 10*3/uL (ref 4.0–10.5)
nRBC: 0 % (ref 0.0–0.2)

## 2024-01-15 LAB — IRON AND TIBC
Iron: 31 ug/dL (ref 28–170)
Iron: 26 ug/dL — ABNORMAL LOW (ref 28–170)
Saturation Ratios: 12 % (ref 10.4–31.8)
Saturation Ratios: 11 % (ref 10.4–31.8)
TIBC: 252 ug/dL (ref 250–450)
TIBC: 239 ug/dL — ABNORMAL LOW (ref 250–450)
UIBC: 221 ug/dL
UIBC: 213 ug/dL

## 2024-01-15 LAB — I-STAT CHEM 8, ED
BUN: 104 mg/dL — ABNORMAL HIGH (ref 8–23)
Calcium, Ion: 1.05 mmol/L — ABNORMAL LOW (ref 1.15–1.40)
Chloride: 112 mmol/L — ABNORMAL HIGH (ref 98–111)
Creatinine, Ser: 11 mg/dL — ABNORMAL HIGH (ref 0.44–1.00)
Glucose, Bld: 95 mg/dL (ref 70–99)
HCT: 19 % — ABNORMAL LOW (ref 36.0–46.0)
Hemoglobin: 6.5 g/dL — CL (ref 12.0–15.0)
Potassium: 4.6 mmol/L (ref 3.5–5.1)
Sodium: 136 mmol/L (ref 135–145)
TCO2: 21 mmol/L — ABNORMAL LOW (ref 22–32)

## 2024-01-15 LAB — URINALYSIS, ROUTINE W REFLEX MICROSCOPIC
Bilirubin Urine: NEGATIVE
Glucose, UA: NEGATIVE mg/dL
Ketones, ur: NEGATIVE mg/dL
Nitrite: NEGATIVE
Protein, ur: >300 mg/dL — AB
Specific Gravity, Urine: 1.015 (ref 1.005–1.030)
pH: 6.5 (ref 5.0–8.0)

## 2024-01-15 LAB — VITAMIN B12: Vitamin B-12: 505 pg/mL (ref 180–914)

## 2024-01-15 LAB — RETICULOCYTES
Immature Retic Fract: 15.1 % (ref 2.3–15.9)
RBC.: 2.65 MIL/uL — ABNORMAL LOW (ref 3.87–5.11)
Retic Count, Absolute: 50.4 10*3/uL (ref 19.0–186.0)
Retic Ct Pct: 1.9 % (ref 0.4–3.1)

## 2024-01-15 LAB — FERRITIN
Ferritin: 277 ng/mL (ref 11–307)
Ferritin: 261 ng/mL (ref 11–307)

## 2024-01-15 LAB — URINALYSIS, MICROSCOPIC (REFLEX)

## 2024-01-15 LAB — FOLATE: Folate: 5.9 ng/mL — ABNORMAL LOW (ref >5.9)

## 2024-01-15 LAB — POCT HEMOGLOBIN-HEMACUE: Hemoglobin: 6.9 g/dL — CL (ref 12.0–15.0)

## 2024-01-15 LAB — CBG MONITORING, ED: Glucose-Capillary: 91 mg/dL (ref 70–99)

## 2024-01-15 MED ORDER — DARBEPOETIN ALFA 150 MCG/0.3ML IJ SOSY: 150.0000 ug | PREFILLED_SYRINGE | INTRAMUSCULAR | Status: DC

## 2024-01-15 MED ORDER — DARBEPOETIN ALFA 150 MCG/0.3ML IJ SOSY
PREFILLED_SYRINGE | INTRAMUSCULAR | Status: AC
  Administered 2024-01-15: 150 ug via SUBCUTANEOUS
  Filled 2024-01-15: qty 0.3

## 2024-01-15 MED ORDER — EPOETIN ALFA 40000 UNIT/ML IJ SOLN
40000.0000 [IU] | Freq: Once | INTRAMUSCULAR | Status: DC
Start: 2024-01-15 — End: 2024-01-15

## 2024-01-15 MED ORDER — SODIUM CHLORIDE 0.9% IV SOLUTION: Freq: Once | INTRAVENOUS | Status: AC

## 2024-01-15 MED ORDER — EPOETIN ALFA 40000 UNIT/ML IJ SOLN
INTRAMUSCULAR | Status: AC
  Filled 2024-01-15: qty 1

## 2024-01-15 NOTE — ED Notes (Signed)
 Discharge instructions reviewed with patient. Follow-up care reviewed with patient.  Pt verbalized understanding and denies any questions regarding discharge.  Pt in no acute distress at time of discharge. Pt denies pain. Pt respirations regular, unlabored. GCS 15. Pt ambulatory upon discharge.

## 2024-01-15 NOTE — ED Notes (Signed)
 No adverse reaction upon completion of blood administration. Pt denies shortness of breath, itching.

## 2024-01-15 NOTE — Discharge Instructions (Addendum)
 Your hemoglobin is low at 6.5.  You received blood transfusion in the ER.  Please follow-up closely with your doctor for further evaluation and management of your anemia.

## 2024-01-15 NOTE — Progress Notes (Signed)
 Hemocue 6.9, called [facility] and informed of results and shortness of breath. Denies seeing blood in toilet or vomiting blood. Increased Aranesp  to 150mcg monthly, that dose given today. Per Dr. Higinio Love patient taken to ED for blood transfusion. Report called to charge RN. Patient taken to ED via wheelchair.

## 2024-01-15 NOTE — ED Triage Notes (Signed)
 Pt here for an iron  shot and pt states she has been tired for 6 months. C/O [facility] for 6 months. Axox4. Hx of anemia.

## 2024-01-15 NOTE — ED Notes (Signed)
 Pt sustained no adverse reaction 15 minutes post blood administration.

## 2024-01-15 NOTE — ED Provider Notes (Signed)
 [place] EMERGENCY DEPARTMENT AT [facility] Provider Note   CSN: [id number] Arrival date & time: 01/15/24  1248     History  Chief Complaint  Patient presents with   Weakness    Catherine Aguilar is a 68 y.o. female.  The history is provided by the patient and medical records. No language interpreter was used.  Weakness    68 year old female history of anemia secondary to chronic kidney disease, hypertension, COPD, substance abuse, prior stroke presenting for anemia.  Patient states she normally goes to the clinic to get Aranesp  transfusion on a monthly basis.  She went to get her transfusion today and blood work was done and patient was told that her blood count is low and she needs to come to the ER for blood transfusion.  Patient does endorse chronic fatigue and shortness of breath which is not new.  She denies having any fever or chills denies any rectal bleeding denies vomiting blood and she is without any other complaint.  Home Medications Prior to Admission medications   Medication Sig Start Date End Date Taking? Authorizing Provider  albuterol  (PROVENTIL ) (2.5 MG/3ML) 0.083% nebulizer solution Take 3 mLs (2.5 mg total) by nebulization every 6 (six) hours as needed for wheezing or shortness of breath. Patient not taking: Reported on 12/20/2023 11/06/20   Siegfried Dress C, DO  albuterol  (VENTOLIN  HFA) 108 (90 Base) MCG/ACT inhaler INHALE 2 PUFFS BY MOUTH EVERY 4-6 HOURS 04/18/23   Genora Kidd, MD  amLODipine  (NORVASC ) 10 MG tablet Take 10 mg by mouth in the morning. 12/06/23   [provider]  aspirin  81 MG EC tablet TAKE 1 TABLET BY MOUTH EVERY DAY Patient taking differently: Take 81 mg by mouth in the morning. 08/09/20   America Just, DO  atorvastatin  (LIPITOR) 20 MG tablet Take 1 tablet (20 mg total) by mouth daily. 12/12/23   Dema Filler, MD  B-D UF III MINI PEN NEEDLES 31G X 5 MM MISC USE AS DIRECTED 12/16/23   Genora Kidd, MD  doxazosin   (CARDURA ) 2 MG tablet Take 2 mg by mouth at bedtime. 10/31/23   [provider]  fluticasone  (FLONASE ) 50 MCG/ACT nasal spray Place 2 sprays into both nostrils daily. Patient taking differently: Place 2 sprays into both nostrils as needed for allergies. 09/14/19   Antonio Baumgarten, NP  glucose blood (ACCU-CHEK GUIDE) test strip Check blood glucose prior to lantus  administration 04/18/23   Jagadish, Mayuri, MD  guaiFENesin  (ROBITUSSIN) 100 MG/5ML liquid Take 5 mLs by mouth every 4 (four) hours as needed for cough or to loosen phlegm. 12/12/23   Dema Filler, MD  insulin  glargine (LANTUS  SOLOSTAR) 100 UNIT/ML Solostar Pen Inject 5 Units into the skin daily. Patient taking differently: Inject 5 Units into the skin at bedtime. 05/14/23   Genora Kidd, MD  labetalol  (NORMODYNE ) 300 MG tablet Take 300 mg by mouth 2 (two) times daily. 12/06/23   [provider]  Lancets Misc. (ACCU-CHEK SOFTCLIX LANCET DEV) KIT Check BG prior to administering insulin  04/18/23   Genora Kidd, MD  levofloxacin  (LEVAQUIN ) 500 MG tablet Take 1 tablet (500 mg total) by mouth every other day. Patient not taking: Reported on 12/20/2023 12/14/23   Dema Filler, MD  lidocaine  (LIDODERM ) 5 % Place 1 patch onto the skin daily. Remove & Discard patch within 12 hours or as directed by MD 12/24/23   Genora Kidd, MD  predniSONE  (DELTASONE ) 20 MG tablet 2 tabs po daily x 4 days 12/20/23  Albertus Hughs, DO  sodium bicarbonate  650 MG tablet Take 2 tablets (1,300 mg total) by mouth 2 (two) times daily. Patient taking differently: Take 650-1,300 mg by mouth See admin instructions. Take 1 tablet (650mg ) by mouth every morning, then take 1 tablet (650mg ) by mouth at noon, then take 2 tablets (1300mg ) by mouth everyday at bedtime. 11/06/20   America Just, DO  umeclidinium-vilanterol (ANORO ELLIPTA ) 62.5-25 MCG/ACT AEPB Inhale 1 puff into the lungs daily. 12/12/23   Dema Filler, MD      Allergies    Patient has no known  allergies.    Review of Systems   Review of Systems  Neurological:  Positive for weakness.  All other systems reviewed and are negative.   Physical Exam Updated Vital Signs BP 139/62   Pulse 66   Temp 98.7 F (37.1 C) (Oral)   Resp 20   Ht 5\' 7"  (1.702 m)   Wt 91.6 kg   SpO2 98%   BMI 31.64 kg/m  Physical Exam Vitals and nursing note reviewed.  Constitutional:      General: She is not in acute distress.    Appearance: She is well-developed.  HENT:     Head: Atraumatic.  Eyes:     Conjunctiva/sclera: Conjunctivae normal.  Cardiovascular:     Rate and Rhythm: Normal rate and regular rhythm.     Pulses: Normal pulses.     Heart sounds: Normal heart sounds.  Pulmonary:     Effort: Pulmonary effort is normal.  Abdominal:     Palpations: Abdomen is soft.     Tenderness: There is no abdominal tenderness.  Musculoskeletal:     Cervical back: Neck supple.  Skin:    Findings: No rash.  Neurological:     Mental Status: She is alert.  Psychiatric:        Mood and Affect: Mood normal.     ED Results / Procedures / Treatments   Labs (all labs ordered are listed, but only abnormal results are displayed) Labs Reviewed  RETICULOCYTES - Abnormal; Notable for the following components:      Result Value   RBC. 2.65 (*)    All other components within normal limits  CBC WITH DIFFERENTIAL/PLATELET - Abnormal; Notable for the following components:   RBC 2.64 (*)    Hemoglobin 6.7 (*)    HCT 21.7 (*)    MCH 25.4 (*)    RDW 16.1 (*)    Abs Immature Granulocytes 0.08 (*)    All other components within normal limits  I-STAT CHEM 8, ED - Abnormal; Notable for the following components:   Chloride 112 (*)    BUN 104 (*)    Creatinine, Ser 11.00 (*)    Calcium , Ion 1.05 (*)    TCO2 21 (*)    Hemoglobin 6.5 (*)    HCT 19.0 (*)    All other components within normal limits  VITAMIN B12  FOLATE  IRON  AND TIBC  FERRITIN  URINALYSIS, ROUTINE W REFLEX MICROSCOPIC  CBG  MONITORING, ED  POC OCCULT BLOOD, ED  TYPE AND SCREEN  PREPARE RBC (CROSSMATCH)    EKG None  Radiology No results found.  Procedures .Critical Care  Performed by: Debbra Fairy, PA-C Authorized by: Debbra Fairy, PA-C   Critical care provider statement:    Critical care time (minutes):  30   Critical care was time spent personally by me on the following activities:  Development of treatment plan with patient or surrogate, discussions with consultants,  evaluation of patient's response to treatment, examination of patient, ordering and review of laboratory studies, ordering and review of radiographic studies, ordering and performing treatments and interventions, pulse oximetry, re-evaluation of patient's condition and review of old charts     Medications Ordered in ED Medications  0.9 %  sodium chloride  infusion (Manually program via Guardrails IV Fluids) (has no administration in time range)    ED Course/ Medical Decision Making/ A&P                                 Medical Decision Making Amount and/or Complexity of Data Reviewed Labs: ordered.  Risk Prescription drug management.   BP 139/62   Pulse 66   Temp 98.7 F (37.1 C) (Oral)   Resp 20   Ht 5\' 7"  (1.702 m)   Wt 91.6 kg   SpO2 98%   BMI 31.64 kg/m   64:6 PM   68 year old female history of anemia secondary to chronic kidney disease, hypertension, COPD, substance abuse, prior stroke presenting for anemia.  Patient states she normally goes to the clinic to get Aranesp  transfusion on a monthly basis.  She went to get her transfusion today and blood work was done and patient was told that her blood count is low and she needs to come to the ER for blood transfusion.  Patient does endorse chronic fatigue and shortness of breath which is not new.  She denies having any fever or chills denies any rectal bleeding denies vomiting blood and she is without any other complaint.  Exam overall reassuring, heart with normal  rate and rhythm, lungs clear abdomen soft nontender strength equal throughout and patient mentating appropriately.  -Labs ordered, independently viewed and interpreted by me.  Labs remarkable for hemoglobin of 6.5.  I have ordered for 1 unit of blood transfusion. -The patient was maintained on a cardiac monitor.  I personally viewed and interpreted the cardiac monitored which showed an underlying rhythm of: Normal sinus rhythm -Imaging not considered at this time -This patient presents to the ED for concern of weakness, this involves an extensive number of treatment options, and is a complaint that carries with it a high risk of complications and morbidity.  The differential diagnosis includes anemia, hypovolemia, electrolyte imbalance, cardiac arrhythmia, infection -Co morbidities that complicate the patient evaluation includes chronic kidney disease, anemia, hypertension, COPD -Treatment includes blood transfusion -Reevaluation of the patient after these medicines showed that the patient improved -PCP office notes or outside notes reviewed -Discussion with oncoming team however discharge patient was blood transfusion is done -Escalation to admission/observation considered: patients feels much better, is comfortable with discharge, and will follow up with PCP -Prescription medication considered, patient comfortable with outpatient follow-up for her monthly Aranesp  infusion -Social Determinant of Health considered which includes physical inactivity         Final Clinical Impression(s) / ED Diagnoses Final diagnoses:  Symptomatic anemia    Rx / DC Orders ED Discharge Orders     None         Debbra Fairy, PA-C 01/17/24 0910    Deatra Face, MD 01/20/24 1544

## 2024-01-15 NOTE — ED Notes (Signed)
 Blood consent signed by patient. Pt denies any questions prior to signing form.

## 2024-01-16 LAB — TYPE AND SCREEN
ABO/RH(D): A POS
Antibody Screen: NEGATIVE
Unit division: 0

## 2024-01-16 LAB — PREPARE RBC (CROSSMATCH)

## 2024-01-16 LAB — BPAM RBC
Blood Product Expiration Date: 202506062359
ISSUE DATE / TIME: 202505071631
Unit Type and Rh: 6200

## 2024-01-21 ENCOUNTER — Other Ambulatory Visit: Payer: 59

## 2024-01-22 ENCOUNTER — Encounter (HOSPITAL_COMMUNITY)

## 2024-02-11 ENCOUNTER — Telehealth: Payer: Self-pay

## 2024-02-11 NOTE — Telephone Encounter (Signed)
 Auth Submission: NO AUTH NEEDED Site of care: Site of care: [facility] Payer: [employer] dual complete Medication & CPT/J Code(s) submitted: Aranesp  [id number] Route of submission (phone, fax, portal): portal Phone # Fax # Auth type: Buy/Bill PB Units/visits requested:  Reference number: [id number] Approval from: 02/11/24 to 09/09/24

## 2024-02-12 ENCOUNTER — Encounter (HOSPITAL_COMMUNITY)

## 2024-02-12 ENCOUNTER — Ambulatory Visit (HOSPITAL_COMMUNITY)
Admission: RE | Admit: 2024-02-12 | Discharge: 2024-02-12 | Disposition: A | Discharge status: Home / Self Care | Source: Ambulatory Visit | Attending: Internal Medicine | Admitting: Internal Medicine

## 2024-02-12 DIAGNOSIS — D631 Anemia in chronic kidney disease: Secondary | ICD-10-CM | POA: Insufficient documentation

## 2024-02-12 DIAGNOSIS — N185 Chronic kidney disease, stage 5: Secondary | ICD-10-CM | POA: Diagnosis present

## 2024-02-12 LAB — IRON AND TIBC
Iron: 42 ug/dL (ref 28–170)
Saturation Ratios: 16 % (ref 10.4–31.8)
TIBC: 262 ug/dL (ref 250–450)
UIBC: 220 ug/dL

## 2024-02-12 LAB — FERRITIN: Ferritin: 289 ng/mL (ref 11–307)

## 2024-02-12 LAB — POCT HEMOGLOBIN-HEMACUE: Hemoglobin: 7.2 g/dL — ABNORMAL LOW (ref 12.0–15.0)

## 2024-02-12 MED ORDER — DARBEPOETIN ALFA 200 MCG/0.4ML IJ SOSY
PREFILLED_SYRINGE | INTRAMUSCULAR | Status: AC
  Filled 2024-02-12: qty 0.4

## 2024-02-12 MED ORDER — DARBEPOETIN ALFA 200 MCG/0.4ML IJ SOSY
200.0000 ug | PREFILLED_SYRINGE | Freq: Once | INTRAMUSCULAR | Status: AC
  Administered 2024-02-12: 200 ug via SUBCUTANEOUS

## 2024-02-12 MED ORDER — DARBEPOETIN ALFA 150 MCG/0.3ML IJ SOSY
PREFILLED_SYRINGE | INTRAMUSCULAR | Status: AC
  Filled 2024-02-12: qty 0.3

## 2024-02-12 MED ORDER — DARBEPOETIN ALFA 150 MCG/0.3ML IJ SOSY: 150.0000 ug | PREFILLED_SYRINGE | INTRAMUSCULAR | Status: DC

## 2024-02-12 NOTE — Progress Notes (Signed)
 HGB 7.2 today, denies any evidence of bleeding, says her shortness of breath is chronic and no worse then normal.  Orders received to increase aranesp  to 200mcg every 14 days.

## 2024-02-18 ENCOUNTER — Encounter: Payer: Self-pay | Admitting: *Deleted

## 2024-02-26 ENCOUNTER — Ambulatory Visit (HOSPITAL_COMMUNITY)
Admission: RE | Admit: 2024-02-26 | Discharge: 2024-02-26 | Disposition: A | Discharge status: Home / Self Care | Source: Ambulatory Visit | Attending: Internal Medicine | Admitting: Internal Medicine

## 2024-02-26 VITALS — BP 143/79 | HR 62 | Temp 97.7°F | Resp 20

## 2024-02-26 DIAGNOSIS — D631 Anemia in chronic kidney disease: Secondary | ICD-10-CM | POA: Diagnosis present

## 2024-02-26 DIAGNOSIS — N185 Chronic kidney disease, stage 5: Secondary | ICD-10-CM | POA: Diagnosis present

## 2024-02-26 MED ORDER — DARBEPOETIN ALFA 200 MCG/0.4ML IJ SOSY
200.0000 ug | PREFILLED_SYRINGE | INTRAMUSCULAR | Status: DC
  Administered 2024-02-26: 200 ug via SUBCUTANEOUS

## 2024-02-26 MED ORDER — DARBEPOETIN ALFA 200 MCG/0.4ML IJ SOSY
PREFILLED_SYRINGE | INTRAMUSCULAR | Status: AC
Start: 2024-02-26 — End: 2024-02-26
  Filled 2024-02-26: qty 0.4

## 2024-02-26 NOTE — Progress Notes (Signed)
 Hemocye 7.2 today, no symptoms, 7.2 2 weeks ago and 2 weeks ago MD increase dose and frequency for pt.  Reported all the [place] at [facility].  Orders received to give 200mcg aranesp  today and they will send over new orders to increase her dose next visit

## 2024-02-27 LAB — POCT HEMOGLOBIN-HEMACUE: Hemoglobin: 7.2 g/dL — ABNORMAL LOW (ref 12.0–15.0)

## 2024-03-09 ENCOUNTER — Other Ambulatory Visit (HOSPITAL_COMMUNITY): Payer: Self-pay | Admitting: *Deleted

## 2024-03-11 ENCOUNTER — Other Ambulatory Visit: Payer: Self-pay | Admitting: Student

## 2024-03-11 ENCOUNTER — Encounter (HOSPITAL_COMMUNITY)

## 2024-03-11 ENCOUNTER — Ambulatory Visit (HOSPITAL_COMMUNITY)
Admission: RE | Admit: 2024-03-11 | Discharge: 2024-03-11 | Disposition: A | Discharge status: Home / Self Care | Source: Ambulatory Visit | Attending: Internal Medicine | Admitting: Internal Medicine

## 2024-03-11 VITALS — BP 172/77 | HR 60 | Temp 97.8°F | Resp 20

## 2024-03-11 DIAGNOSIS — N185 Chronic kidney disease, stage 5: Secondary | ICD-10-CM | POA: Diagnosis present

## 2024-03-11 DIAGNOSIS — D631 Anemia in chronic kidney disease: Secondary | ICD-10-CM | POA: Diagnosis present

## 2024-03-11 LAB — FERRITIN: Ferritin: 159 ng/mL (ref 11–307)

## 2024-03-11 LAB — IRON AND TIBC
Iron: 47 ug/dL (ref 28–170)
Saturation Ratios: 14 % (ref 10.4–31.8)
TIBC: 330 ug/dL (ref 250–450)
UIBC: 283 ug/dL

## 2024-03-11 LAB — POCT HEMOGLOBIN-HEMACUE: Hemoglobin: 8.3 g/dL — ABNORMAL LOW (ref 12.0–15.0)

## 2024-03-11 MED ORDER — DARBEPOETIN ALFA 150 MCG/0.3ML IJ SOSY
PREFILLED_SYRINGE | INTRAMUSCULAR | Status: AC
  Filled 2024-03-11: qty 0.6

## 2024-03-11 MED ORDER — DARBEPOETIN ALFA 300 MCG/0.6ML IJ SOSY
300.0000 ug | PREFILLED_SYRINGE | INTRAMUSCULAR | Status: DC
  Administered 2024-03-11: 300 ug via SUBCUTANEOUS
  Filled 2024-03-11: qty 0.6

## 2024-03-23 ENCOUNTER — Telehealth (HOSPITAL_COMMUNITY): Payer: Self-pay

## 2024-03-23 NOTE — Telephone Encounter (Signed)
 Auth Submission: NO AUTH NEEDED Site of care: Site of care: [facility] Payer: [employer] Medicare, [employer] Medication & CPT/J Code(s) submitted: Feraheme  (ferumoxytol ) [id number] Diagnosis Code:  Route of submission (phone, fax, portal):  Phone # Fax # Auth type: Buy/Bill HB Units/visits requested: 510mg  x 2 doses Reference number:  Approval from: 03/23/24 to 07/24/24

## 2024-03-24 ENCOUNTER — Other Ambulatory Visit (HOSPITAL_COMMUNITY): Payer: Self-pay

## 2024-03-25 ENCOUNTER — Encounter (HOSPITAL_COMMUNITY)
Admission: RE | Admit: 2024-03-25 | Discharge: 2024-03-25 | Disposition: A | Discharge status: Home / Self Care | Source: Ambulatory Visit | Attending: Internal Medicine | Admitting: Internal Medicine

## 2024-03-25 VITALS — BP 150/70 | HR 57 | Temp 97.6°F | Resp 17

## 2024-03-25 DIAGNOSIS — D631 Anemia in chronic kidney disease: Secondary | ICD-10-CM | POA: Diagnosis present

## 2024-03-25 DIAGNOSIS — N185 Chronic kidney disease, stage 5: Secondary | ICD-10-CM | POA: Insufficient documentation

## 2024-03-25 LAB — POCT HEMOGLOBIN-HEMACUE: Hemoglobin: 8.9 g/dL — ABNORMAL LOW (ref 12.0–15.0)

## 2024-03-25 MED ORDER — SODIUM CHLORIDE 0.9 % IV SOLN
510.0000 mg | INTRAVENOUS | Status: DC
  Administered 2024-03-25: 510 mg via INTRAVENOUS
  Filled 2024-03-25: qty 510

## 2024-03-25 MED ORDER — DARBEPOETIN ALFA 150 MCG/0.3ML IJ SOSY
PREFILLED_SYRINGE | INTRAMUSCULAR | Status: AC
  Filled 2024-03-25: qty 0.6

## 2024-03-25 MED ORDER — DARBEPOETIN ALFA 150 MCG/0.3ML IJ SOSY
300.0000 ug | PREFILLED_SYRINGE | INTRAMUSCULAR | Status: DC
  Administered 2024-03-25: 300 ug via SUBCUTANEOUS

## 2024-04-08 ENCOUNTER — Encounter (HOSPITAL_COMMUNITY)
Admission: RE | Admit: 2024-04-08 | Discharge: 2024-04-08 | Disposition: A | Discharge status: Home / Self Care | Source: Ambulatory Visit | Attending: Internal Medicine | Admitting: Internal Medicine

## 2024-04-08 VITALS — BP 153/68 | HR 54 | Temp 97.5°F | Resp 16

## 2024-04-08 DIAGNOSIS — N185 Chronic kidney disease, stage 5: Secondary | ICD-10-CM | POA: Diagnosis not present

## 2024-04-08 DIAGNOSIS — D631 Anemia in chronic kidney disease: Secondary | ICD-10-CM

## 2024-04-08 LAB — POCT HEMOGLOBIN-HEMACUE: Hemoglobin: 10.1 g/dL — ABNORMAL LOW (ref 12.0–15.0)

## 2024-04-08 MED ORDER — DARBEPOETIN ALFA 300 MCG/0.6ML IJ SOSY
300.0000 ug | PREFILLED_SYRINGE | INTRAMUSCULAR | Status: DC
  Filled 2024-04-08: qty 0.6

## 2024-04-08 MED ORDER — SODIUM CHLORIDE 0.9 % IV SOLN
510.0000 mg | INTRAVENOUS | Status: AC
  Administered 2024-04-08: 510 mg via INTRAVENOUS
  Filled 2024-04-08: qty 510

## 2024-04-08 MED ORDER — DARBEPOETIN ALFA 150 MCG/0.3ML IJ SOSY
PREFILLED_SYRINGE | INTRAMUSCULAR | Status: AC
  Filled 2024-04-08: qty 0.6

## 2024-04-08 MED ORDER — DARBEPOETIN ALFA 150 MCG/0.3ML IJ SOSY
300.0000 ug | PREFILLED_SYRINGE | Freq: Once | INTRAMUSCULAR | Status: AC
  Administered 2024-04-08: 300 ug via SUBCUTANEOUS

## 2024-04-22 ENCOUNTER — Ambulatory Visit (HOSPITAL_COMMUNITY)
Admission: RE | Admit: 2024-04-22 | Discharge: 2024-04-22 | Disposition: A | Discharge status: Home / Self Care | Source: Ambulatory Visit | Attending: Internal Medicine | Admitting: Internal Medicine

## 2024-04-22 VITALS — BP 157/78 | HR 65 | Temp 97.8°F | Resp 20

## 2024-04-22 DIAGNOSIS — N185 Chronic kidney disease, stage 5: Secondary | ICD-10-CM | POA: Insufficient documentation

## 2024-04-22 DIAGNOSIS — D631 Anemia in chronic kidney disease: Secondary | ICD-10-CM | POA: Insufficient documentation

## 2024-04-22 LAB — IRON AND TIBC
Iron: 53 ug/dL (ref 28–170)
Saturation Ratios: 19 % (ref 10.4–31.8)
TIBC: 274 ug/dL (ref 250–450)
UIBC: 221 ug/dL

## 2024-04-22 LAB — POCT HEMOGLOBIN-HEMACUE: Hemoglobin: 11.2 g/dL — ABNORMAL LOW (ref 12.0–15.0)

## 2024-04-22 LAB — FERRITIN: Ferritin: 331 ng/mL — ABNORMAL HIGH (ref 11–307)

## 2024-04-22 MED ORDER — DARBEPOETIN ALFA 200 MCG/0.4ML IJ SOSY
200.0000 ug | PREFILLED_SYRINGE | INTRAMUSCULAR | Status: DC
  Administered 2024-04-22 (×2): 200 ug via SUBCUTANEOUS

## 2024-04-22 MED ORDER — DARBEPOETIN ALFA 200 MCG/0.4ML IJ SOSY
PREFILLED_SYRINGE | INTRAMUSCULAR | Status: AC
  Filled 2024-04-22: qty 0.4

## 2024-05-06 ENCOUNTER — Encounter (HOSPITAL_COMMUNITY)
Admission: RE | Admit: 2024-05-06 | Discharge: 2024-05-06 | Disposition: A | Discharge status: Home / Self Care | Source: Ambulatory Visit | Attending: Internal Medicine | Admitting: Internal Medicine

## 2024-05-06 VITALS — BP 134/105 | HR 64 | Temp 97.0°F | Resp 18

## 2024-05-06 DIAGNOSIS — N185 Chronic kidney disease, stage 5: Secondary | ICD-10-CM | POA: Insufficient documentation

## 2024-05-06 DIAGNOSIS — D631 Anemia in chronic kidney disease: Secondary | ICD-10-CM | POA: Insufficient documentation

## 2024-05-06 LAB — POCT HEMOGLOBIN-HEMACUE: Hemoglobin: 12.1 g/dL (ref 12.0–15.0)

## 2024-05-06 MED ORDER — DARBEPOETIN ALFA 200 MCG/0.4ML IJ SOSY: 200.0000 ug | PREFILLED_SYRINGE | INTRAMUSCULAR | Status: DC

## 2024-05-17 ENCOUNTER — Other Ambulatory Visit (HOSPITAL_COMMUNITY): Payer: Self-pay | Admitting: Internal Medicine

## 2024-05-17 DIAGNOSIS — N185 Chronic kidney disease, stage 5: Secondary | ICD-10-CM | POA: Insufficient documentation

## 2024-05-17 DIAGNOSIS — D631 Anemia in chronic kidney disease: Secondary | ICD-10-CM | POA: Insufficient documentation

## 2024-05-20 ENCOUNTER — Emergency Department (HOSPITAL_COMMUNITY)
Admission: EM | Admit: 2024-05-20 | Discharge: 2024-05-20 | Disposition: A | Discharge status: Against Medical Advice | Attending: Emergency Medicine | Admitting: Emergency Medicine

## 2024-05-20 ENCOUNTER — Emergency Department (HOSPITAL_COMMUNITY)

## 2024-05-20 ENCOUNTER — Inpatient Hospital Stay (HOSPITAL_COMMUNITY)
Admission: RE | Admit: 2024-05-20 | Discharge: 2024-05-20 | Disposition: A | Discharge status: Home / Self Care | Source: Ambulatory Visit | Attending: Internal Medicine | Admitting: Internal Medicine

## 2024-05-20 ENCOUNTER — Encounter (HOSPITAL_COMMUNITY): Payer: Self-pay

## 2024-05-20 ENCOUNTER — Other Ambulatory Visit: Payer: Self-pay

## 2024-05-20 ENCOUNTER — Inpatient Hospital Stay (HOSPITAL_COMMUNITY): Admission: RE | Admit: 2024-05-20 | Source: Ambulatory Visit

## 2024-05-20 DIAGNOSIS — R0602 Shortness of breath: Secondary | ICD-10-CM | POA: Insufficient documentation

## 2024-05-20 DIAGNOSIS — R059 Cough, unspecified: Secondary | ICD-10-CM | POA: Diagnosis not present

## 2024-05-20 DIAGNOSIS — Z5321 Procedure and treatment not carried out due to patient leaving prior to being seen by health care provider: Secondary | ICD-10-CM | POA: Insufficient documentation

## 2024-05-20 LAB — BASIC METABOLIC PANEL WITH GFR
Anion gap: 13 (ref 5–15)
BUN: 96 mg/dL — ABNORMAL HIGH (ref 8–23)
CO2: 14 mmol/L — ABNORMAL LOW (ref 22–32)
Calcium: 6.7 mg/dL — ABNORMAL LOW (ref 8.9–10.3)
Chloride: 112 mmol/L — ABNORMAL HIGH (ref 98–111)
Creatinine, Ser: 9.71 mg/dL — ABNORMAL HIGH (ref 0.44–1.00)
GFR, Estimated: 4 mL/min — ABNORMAL LOW (ref >60)
Glucose, Bld: 86 mg/dL (ref 70–99)
Potassium: 3.5 mmol/L (ref 3.5–5.1)
Sodium: 139 mmol/L (ref 135–145)

## 2024-05-20 LAB — CBC
HCT: 31.1 % — ABNORMAL LOW (ref 36.0–46.0)
Hemoglobin: 9.7 g/dL — ABNORMAL LOW (ref 12.0–15.0)
MCH: 25.3 pg — ABNORMAL LOW (ref 26.0–34.0)
MCHC: 31.2 g/dL (ref 30.0–36.0)
MCV: 81 fL (ref 80.0–100.0)
Platelets: 112 K/uL — ABNORMAL LOW (ref 150–400)
RBC: 3.84 MIL/uL — ABNORMAL LOW (ref 3.87–5.11)
RDW: 17.1 % — ABNORMAL HIGH (ref 11.5–15.5)
WBC: 6.4 K/uL (ref 4.0–10.5)
nRBC: 0 % (ref 0.0–0.2)

## 2024-05-20 NOTE — ED Triage Notes (Addendum)
 Pt coming from home for [facility] and persistent cough for a couple of years. Hx of COPD. 93% on room air. EMS gave duoneb and 125 solumedrol.

## 2024-05-20 NOTE — ED Notes (Signed)
 Pt. Called for repeat vitals w/ no response. Moved OTF

## 2024-05-20 NOTE — ED Provider Triage Note (Signed)
 Emergency Medicine Provider Triage Evaluation Note  Catherine Aguilar , a 68 y.o. female  was evaluated in triage.  Pt complains of body pain.  Patient reports that she fell in her home 2 Sundays ago, has been having some soreness on her right side since then but when she woke up this morning her symptoms were worse.  She primarily complains of pain in her right rib cage/hip.  Denies head injury.  Review of Systems  Positive: As above Negative: As above  Physical Exam  BP (!) 157/90   Pulse 64   Temp 98.3 F (36.8 C) (Oral)   Resp (!) 24   Ht 5' 7 (1.702 m)   Wt 91.6 kg   SpO2 94%   BMI 31.64 kg/m  Gen:   Awake, no distress   Resp:  Normal effort  MSK:   Moves extremities without difficulty  Other:    Medical Decision Making  Medically screening exam initiated at 12:21 PM.  Appropriate orders placed.  Catherine Aguilar was informed that the remainder of the evaluation will be completed by another provider, this initial triage assessment does not replace that evaluation, and the importance of remaining in the ED until their evaluation is complete.     [name] SAILOR, [place] 05/20/24 1222

## 2024-05-27 ENCOUNTER — Ambulatory Visit (HOSPITAL_COMMUNITY)
Admission: RE | Admit: 2024-05-27 | Discharge: 2024-05-27 | Disposition: A | Discharge status: Home / Self Care | Source: Ambulatory Visit | Attending: Internal Medicine | Admitting: Internal Medicine

## 2024-05-27 VITALS — BP 155/80 | HR 69 | Temp 97.3°F | Resp 18

## 2024-05-27 DIAGNOSIS — D631 Anemia in chronic kidney disease: Secondary | ICD-10-CM | POA: Diagnosis present

## 2024-05-27 DIAGNOSIS — N185 Chronic kidney disease, stage 5: Secondary | ICD-10-CM | POA: Insufficient documentation

## 2024-05-27 LAB — FERRITIN: Ferritin: 273 ng/mL (ref 11–307)

## 2024-05-27 LAB — IRON AND TIBC
Iron: 103 ug/dL (ref 28–170)
Saturation Ratios: 40 % — ABNORMAL HIGH (ref 10.4–31.8)
TIBC: 259 ug/dL (ref 250–450)
UIBC: 156 ug/dL

## 2024-05-27 MED ORDER — DARBEPOETIN ALFA 200 MCG/0.4ML IJ SOSY
200.0000 ug | PREFILLED_SYRINGE | Freq: Once | INTRAMUSCULAR | Status: AC
  Administered 2024-05-27: 200 ug via SUBCUTANEOUS

## 2024-05-27 MED ORDER — DARBEPOETIN ALFA 200 MCG/0.4ML IJ SOSY
PREFILLED_SYRINGE | INTRAMUSCULAR | Status: AC
  Filled 2024-05-27: qty 0.4

## 2024-05-28 LAB — POCT HEMOGLOBIN-HEMACUE: Hemoglobin: 10.1 g/dL — ABNORMAL LOW (ref 12.0–15.0)

## 2024-05-29 ENCOUNTER — Ambulatory Visit: Payer: Self-pay | Admitting: Internal Medicine

## 2024-05-29 NOTE — Telephone Encounter (Signed)
 FYI Only or Action Required?: Action required by provider: request for appointment.  Patient is followed in Pulmonology for COPD, asthma, last seen on 07/26/2023 by [name], MD.  Called Nurse Triage reporting Cough and Shortness of Breath.  Symptoms began several years ago.  Interventions attempted: Rescue inhaler.  Symptoms are: unchanged.  Triage Disposition: See Within 3 Days in Office (overriding See HCP Within 4 Hours (Or PCP Triage))  Patient/caregiver understands and will follow disposition?: Yes                             Copied from CRM [id number]. Topic: Clinical - Red Word Triage >> May 29, 2024  2:08 PM Catherine Aguilar wrote: Red Word that prompted transfer to Nurse Triage: Patient [phone] states uncontrolable cough getting worse in the last 2 weeks, shotness of breath, wheezing, denies dizziness, pain nor fever. Patient want to schedule to see Dr. Geronimo (lov 07/26/23). Please advise.    ----------------------------------------------------------------------- From previous Reason for Contact - Scheduling: Patient/patient representative is calling to schedule an appointment. Refer to attachments for appointment information. >> May 29, 2024  2:20 PM Catherine Aguilar wrote: Patient states she cannot wait on the line for NT, please call back.  Reason for Disposition  Wheezing is present  Answer Assessment - Initial Assessment Questions E2C2 Pulmonary Triage - Initial Assessment Questions  1. Chief Complaint (e.g., cough, sob, wheezing, fever, chills, sweat or additional symptoms) *Go to specific symptom protocol after initial questions. Persistent cough and SOB upon exertion   2. How long have symptoms been present? Years  3. Have you used any OTC meds to help with symptoms? If yes, ask What medications? Denies   4. Have you used your inhalers/maintenance medication? If yes, What medications? Albuterol  as needed, states this  inhaler does not work  5. Do you wear supplemental oxygen? If yes, How many liters are you supposed to use? Denies   6. Do you monitor your oxygen levels? If yes, What is your reading (oxygen level) today? 93% today   7. What is your usual oxygen saturation reading?  (Note: Pulmonary O2 sats should be 90% or greater) Unsure    1. ONSET: When did the cough begin?      Ongoing for awhile (years), patient did not provide details when prompted, states some days it's worse, patient denied new/worsening symptoms when prompted again at the end of the call 2. SEVERITY: How bad is the cough today?      Same as everyday, one coughing spell earlier this morning  3. SPUTUM: Describe the color of your sputum (e.g., none, dry cough; clear, white, yellow, green)     Denies 4. HEMOPTYSIS: Are you coughing up any blood? If Yes, ask: How much? (e.g., flecks, streaks, tablespoons, etc.)     Denies 5. DIFFICULTY BREATHING: Are you having difficulty breathing? If Yes, ask: How bad is it? (e.g., mild, moderate, severe)      SOB upon exertion- states this is baseline for her, denies SOB at rest, patient able to speak in clear and complete sentences while on phone with this RN 6. FEVER: Do you have a fever? If Yes, ask: What is your temperature, how was it measured, and when did it start?     Denies 7. CARDIAC HISTORY: Do you have any history of heart disease? (e.g., heart attack, congestive heart failure)      Denies 8. LUNG HISTORY: Do you have any history of  lung disease?  (e.g., pulmonary embolus, asthma, emphysema)     Asthma, COPD 9. PE RISK FACTORS: Do you have a history of blood clots? (or: recent major surgery, recent prolonged travel, bedridden)     Denies 10. OTHER SYMPTOMS: Do you have any other symptoms? (e.g., runny nose, wheezing, chest pain)     Wheezing, denies chest pain, denies cold/flu symptoms    Patient called in requesting an appointment with  Dr. Geronimo. Patient denied new/worsening symptoms and stated the symptoms she is experiencing have been ongoing for years. No availability with Dr. Geronimo until 06/26/24, which is outside of recommended disposition. Please advise on scheduling. Patient would like a call back.  Protocols used: Cough - Acute Non-Productive-A-AH

## 2024-06-01 NOTE — Telephone Encounter (Signed)
 Offered pt acute ov 9/23 with Dr. Geronimo and pt was unable to schedule due to transportation.  Scheduled pt ov 10/17 with MR and advised pt to call back or ER if developing any worsening symptoms and to continue using inhalers.

## 2024-06-03 ENCOUNTER — Encounter (HOSPITAL_COMMUNITY)

## 2024-06-10 ENCOUNTER — Ambulatory Visit (HOSPITAL_COMMUNITY)
Admission: RE | Admit: 2024-06-10 | Discharge: 2024-06-10 | Disposition: A | Discharge status: Home / Self Care | Source: Ambulatory Visit | Attending: Internal Medicine | Admitting: Internal Medicine

## 2024-06-10 VITALS — BP 138/76 | HR 66 | Temp 97.4°F | Resp 17

## 2024-06-10 DIAGNOSIS — N185 Chronic kidney disease, stage 5: Secondary | ICD-10-CM | POA: Insufficient documentation

## 2024-06-10 DIAGNOSIS — D631 Anemia in chronic kidney disease: Secondary | ICD-10-CM | POA: Diagnosis present

## 2024-06-10 LAB — POCT HEMOGLOBIN-HEMACUE: Hemoglobin: 10.9 g/dL — ABNORMAL LOW (ref 12.0–15.0)

## 2024-06-10 MED ORDER — DARBEPOETIN ALFA 200 MCG/0.4ML IJ SOSY
200.0000 ug | PREFILLED_SYRINGE | Freq: Once | INTRAMUSCULAR | Status: AC
  Administered 2024-06-10: 200 ug via SUBCUTANEOUS

## 2024-06-10 MED ORDER — DARBEPOETIN ALFA 200 MCG/0.4ML IJ SOSY
PREFILLED_SYRINGE | INTRAMUSCULAR | Status: AC
Start: 2024-06-10 — End: 2024-06-10
  Filled 2024-06-10: qty 0.4

## 2024-06-10 MED ORDER — DARBEPOETIN ALFA 200 MCG/0.4ML IJ SOSY
PREFILLED_SYRINGE | INTRAMUSCULAR | Status: AC
  Filled 2024-06-10: qty 0.4

## 2024-06-11 ENCOUNTER — Ambulatory Visit

## 2024-06-11 VITALS — Ht 67.0 in | Wt 187.0 lb

## 2024-06-11 DIAGNOSIS — Z0000 Encounter for general adult medical examination without abnormal findings: Secondary | ICD-10-CM

## 2024-06-11 NOTE — Progress Notes (Signed)
 Subjective:   Catherine Aguilar is a 68 y.o. female who presents for Medicare Annual (Subsequent) preventive examination.  Visit Complete: Virtual I connected with  Catherine Aguilar on 06/11/24 by a audio enabled telemedicine application and verified that I am speaking with the correct person using two identifiers.  Patient Location: Home  Provider Location: Home Office  I discussed the limitations of evaluation and management by telemedicine. The patient expressed understanding and agreed to proceed.  Vital Signs: Because this visit was a virtual/telehealth visit, some criteria may be missing or patient reported. Any vitals not documented were not able to be obtained and vitals that have been documented are patient reported.   Cardiac Risk Factors include: advanced age (>51men, >39 women);diabetes mellitus;dyslipidemia;hypertension;obesity (BMI >30kg/m2)     Objective:    Today's Vitals   06/11/24 1650  Weight: 187 lb (84.8 kg)  Height: 5' 7 (1.702 m)   Body mass index is 29.29 kg/m.     06/11/2024    4:55 PM 12/30/2023   10:53 AM 12/20/2023    3:35 AM 12/17/2023    2:37 PM 12/12/2023    1:50 PM 12/10/2023   12:00 PM 12/10/2023    8:59 AM  Advanced Directives  Does Patient Have a Medical Advance Directive? No No No No No No No  Would patient like information on creating a medical advance directive? No - Patient declined No - Patient declined  No - Patient declined No - Patient declined No - Patient declined     Current Medications (verified) Outpatient Encounter Medications as of 06/11/2024  Medication Sig   albuterol  (VENTOLIN  HFA) 108 (90 Base) MCG/ACT inhaler INHALE 2 PUFFS BY MOUTH EVERY 4-6 HOURS   amLODipine  (NORVASC ) 10 MG tablet Take 10 mg by mouth in the morning.   atorvastatin  (LIPITOR) 20 MG tablet Take 1 tablet (20 mg total) by mouth daily.   B-D UF III MINI PEN NEEDLES 31G X 5 MM MISC USE AS DIRECTED   doxazosin  (CARDURA ) 2 MG tablet Take 2 mg by mouth at bedtime.    glucose blood (ACCU-CHEK GUIDE) test strip Check blood glucose prior to lantus  administration   guaiFENesin  (ROBITUSSIN) 100 MG/5ML liquid Take 5 mLs by mouth every 4 (four) hours as needed for cough or to loosen phlegm.   insulin  glargine (LANTUS  SOLOSTAR) 100 UNIT/ML Solostar Pen Inject 5 Units into the skin at bedtime.   labetalol  (NORMODYNE ) 300 MG tablet Take 300 mg by mouth 2 (two) times daily.   umeclidinium-vilanterol (ANORO ELLIPTA ) 62.5-25 MCG/ACT AEPB Inhale 1 puff into the lungs daily.   albuterol  (PROVENTIL ) (2.5 MG/3ML) 0.083% nebulizer solution Take 3 mLs (2.5 mg total) by nebulization every 6 (six) hours as needed for wheezing or shortness of breath. (Patient not taking: Reported on 12/20/2023)   aspirin  81 MG EC tablet TAKE 1 TABLET BY MOUTH EVERY DAY (Patient taking differently: Take 81 mg by mouth in the morning.)   fluticasone  (FLONASE ) 50 MCG/ACT nasal spray Place 2 sprays into both nostrils daily. (Patient taking differently: Place 2 sprays into both nostrils as needed for allergies.)   Lancets Misc. (ACCU-CHEK SOFTCLIX LANCET DEV) KIT Check BG prior to administering insulin    levofloxacin  (LEVAQUIN ) 500 MG tablet Take 1 tablet (500 mg total) by mouth every other day. (Patient not taking: Reported on 12/20/2023)   lidocaine  (LIDODERM ) 5 % Place 1 patch onto the skin daily. Remove & Discard patch within 12 hours or as directed by MD (Patient not taking: Reported on 06/11/2024)  predniSONE  (DELTASONE ) 20 MG tablet 2 tabs po daily x 4 days (Patient not taking: Reported on 06/11/2024)   sodium bicarbonate  650 MG tablet Take 2 tablets (1,300 mg total) by mouth 2 (two) times daily. (Patient taking differently: Take 650-1,300 mg by mouth See admin instructions. Take 1 tablet (650mg ) by mouth every morning, then take 1 tablet (650mg ) by mouth at noon, then take 2 tablets (1300mg ) by mouth everyday at bedtime.)   No facility-administered encounter medications on file as of 06/11/2024.     Allergies (verified) Patient has no known allergies.   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   CVA (cerebral vascular accident) (HCC) 07/03/2012   Diffuse pulmonary alveolar hemorrhage    Edema of right lower extremity 03/25/2017   Essential hypertension 12/08/2016   Hemoptysis 12/08/2016   High cholesterol    HLD (hyperlipidemia) 12/08/2016   Hx of adenomatous polyp of colon 04/16/2017   Hypertension    stopped taking medications 10 years ago   Hypokalemia 07/02/2012   Intra-alveolar hemorrhage 12/08/2016   Left facial numbness 07/02/2012   Sepsis (HCC) 12/08/2016   Shortness of breath 12/02/2017   Stroke (HCC)    Substance abuse (HCC) 12/08/2016   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   Tobacco abuse 12/08/2016   Type 2 diabetes mellitus without complication, with long-term current use of insulin  (HCC) 02/11/2017   Wegener's granulomatosis with renal involvement (HCC) 12/08/2016   Past Surgical History:  Procedure Laterality Date   ABDOMINAL HYSTERECTOMY  1985   Family History  Problem Relation Age of Onset   Heart attack Mother    Diabetes Mother    Hypertension Mother    Stroke Mother    Cancer Father    Alcohol abuse Father    Diabetes Father    Hypertension Father    Breast cancer Neg Hx    Colon cancer Neg Hx    Esophageal cancer Neg Hx    Rectal cancer Neg Hx    Stomach cancer Neg Hx    Pancreatic cancer Neg Hx    BRCA 1/2 Neg Hx    Social History   Socioeconomic History   Marital status: Married    Spouse name: Not on file   Number of children: Not on  file   Years of education: Not on file   Highest education level: Not on file  Occupational History   Not on file  Tobacco Use   Smoking status: Former    Current packs/day: 0.00    Average packs/day: 0.2 packs/day for 50.5 years (10.1 ttl pk-yrs)    Types: Cigarettes    Start date: 09/10/1968    Quit date: 03/25/2019    Years since quitting: 5.2   Smokeless tobacco: Never  Vaping Use   Vaping status: Never Used  Substance and Sexual Activity   Alcohol use: Yes    Comment: on the weekends - moderately per pt   Drug use: Yes    Types: Marijuana    Comment: Friday, Saturday, Sunday   Sexual activity: Not Currently  Other Topics Concern   Not on  file  Social History Narrative   Not on file   Social Drivers of Health   Financial Resource Strain: Low Risk  (06/11/2024)   Overall Financial Resource Strain (CARDIA)    Difficulty of Paying Living Expenses: Not hard at all  Food Insecurity: No Food Insecurity (06/11/2024)   Hunger Vital Sign    Worried About Running Out of Food in the Last Year: Never true    Ran Out of Food in the Last Year: Never true  Transportation Needs: No Transportation Needs (06/11/2024)   PRAPARE - Administrator, Civil Service (Medical): No    Lack of Transportation (Non-Medical): No  Physical Activity: Inactive (06/11/2024)   Exercise Vital Sign    Days of Exercise per Week: 0 days    Minutes of Exercise per Session: 0 min  Stress: No Stress Concern Present (06/11/2024)   [employer] of Occupational Health - Occupational Stress Questionnaire    Feeling of Stress: Not at all  Social Connections: Moderately Integrated (06/11/2024)   Social Connection and Isolation Panel    Frequency of Communication with Friends and Family: Three times a week    Frequency of Social Gatherings with Friends and Family: Three times a week    Attends Religious Services: More than 4 times per year    Active Member of Clubs or Organizations: No    Attends Occupational hygienist Meetings: Never    Marital Status: Married    Tobacco Counseling Counseling given: Not Answered   Clinical Intake:  Pre-visit preparation completed: Yes  Pain : No/denies pain     BMI - recorded: 31.38 Nutritional Status: BMI > 30  Obese Nutritional Risks: None Diabetes: Yes CBG done?: No Did pt. bring in CBG monitor from home?: No  How often do you need to have someone help you when you read instructions, pamphlets, or other written materials from your doctor or pharmacy?: 1 - Never What is the last grade level you completed in school?: college  Interpreter Needed?: No  Information entered by :: Arnette Hoots, CMA   Activities of Daily Living    06/11/2024    4:52 PM 12/10/2023   12:00 PM  In your present state of health, do you have any difficulty performing the following activities:  Hearing? 0 0  Vision? 0 0  Difficulty concentrating or making decisions? 0 0  Walking or climbing stairs? 0   Dressing or bathing? 0   Doing errands, shopping? 0 0  Preparing Food and eating ? N   Using the Toilet? N   In the past six months, have you accidently leaked urine? Y   Do you have problems with loss of bowel control? N   Managing your Medications? N   Managing your Finances? N   Housekeeping or managing your Housekeeping? N     Patient Care Team: Adele Song, MD as PCP - General (Family Medicine)  Indicate any recent Medical Services you may have received from other than [facility] providers in the past year (date may be approximate).     Assessment:   This is a routine wellness examination for Catherine Aguilar.  Hearing/Vision screen Hearing Screening - Comments:: No issues Vision Screening - Comments:: No issues   Goals Addressed   None    Depression Screen    06/11/2024    4:56 PM 06/11/2024    4:54 PM 12/30/2023   10:53 AM 12/17/2023    2:36 PM 05/21/2023    8:07 AM 04/18/2023  9:21 AM 06/26/2021    1:44 PM  PHQ 2/9 Scores  PHQ - 2 Score 0 0  0 0  0 0  PHQ- 9 Score    2 0 1 0  Exception Documentation   Patient refusal        Fall Risk    06/11/2024    4:56 PM 12/30/2023   10:53 AM 12/17/2023    2:36 PM 07/26/2023    8:51 AM 05/21/2023    8:03 AM  Fall Risk   Falls in the past year? 0 0 0 0 0  Number falls in past yr: 0 0 0 0 0  Injury with Fall? 0 0 0 0 0  Risk for fall due to : No Fall Risks      Follow up Falls evaluation completed;Education provided    Falls evaluation completed;Education provided;Falls prevention discussed    MEDICARE RISK AT HOME: Medicare Risk at Home Any stairs in or around the home?: Yes If so, are there any without handrails?: No Home free of loose throw rugs in walkways, pet beds, electrical cords, etc?: Yes Adequate lighting in your home to reduce risk of falls?: Yes Life alert?: No Use of a cane, walker or w/c?: No Grab bars in the bathroom?: Yes Shower chair or bench in shower?: No Elevated toilet seat or a handicapped toilet?: No  TIMED UP AND GO:  Was the test performed?  No    Cognitive Function:        06/11/2024    4:56 PM 05/21/2023    8:05 AM  6CIT Screen  What Year? 0 points 0 points  What month? 0 points 0 points  What time?  0 points  Count back from 20 0 points 0 points  Months in reverse -- 0 points  Repeat phrase 6 points 2 points  Total Score  2 points    Immunizations Immunization History  Administered Date(s) Administered   Fluad Quad(high Dose 65+) 06/01/2021   Fluad Trivalent(High Dose 65+) 07/26/2023   Influenza Split 07/02/2012   Influenza,inj,Quad PF,6+ Mos 05/03/2017, 05/13/2018, 06/08/2019   PFIZER Comirnaty(Gray Top)Covid-19 Tri-Sucrose Vaccine 03/05/2020, 03/26/2020, 10/10/2020, 01/23/2021   Pfizer Covid-19 Vaccine Bivalent Booster 19yrs & up 06/26/2021   Pneumococcal Polysaccharide-23 07/02/2012, 11/22/2017, 05/30/2020    TDAP status: Up to date  Flu Vaccine status: Up to date  Pneumococcal vaccine status: Due, Education has been provided  regarding the importance of this vaccine. Advised may receive this vaccine at local pharmacy or Health Dept. Aware to provide a copy of the vaccination record if obtained from local pharmacy or Health Dept. Verbalized acceptance and understanding.  Covid-19 vaccine status: Information provided on how to obtain vaccines.   Qualifies for Shingles Vaccine? Yes   Zostavax completed No   Shingrix Completed?: No.    Education has been provided regarding the importance of this vaccine. Patient has been advised to call insurance company to determine out of pocket expense if they have not yet received this vaccine. Advised may also receive vaccine at local pharmacy or Health Dept. Verbalized acceptance and understanding.  Screening Tests Health Maintenance  Topic Date Due   Zoster Vaccines- Shingrix (1 of 2) Never done   DEXA SCAN  Never done   Pneumococcal Vaccine: 50+ Years (3 of 3 - PCV) 05/30/2021   Colonoscopy  04/10/2022   FOOT EXAM  06/26/2022   Diabetic kidney evaluation - Urine ACR  03/28/2024   Influenza Vaccine  04/10/2024   COVID-19 Vaccine (6 - 2025-26  season) 05/11/2024   Medicare Annual Wellness (AWV)  05/20/2024   HEMOGLOBIN A1C  06/10/2024   OPHTHALMOLOGY EXAM  06/25/2024   Diabetic kidney evaluation - eGFR measurement  05/20/2025   Mammogram  07/18/2025   Hepatitis C Screening  Completed   HPV VACCINES  Aged Out   Meningococcal B Vaccine  Aged Out   DTaP/Tdap/Td  Discontinued    Health Maintenance  Health Maintenance Due  Topic Date Due   Zoster Vaccines- Shingrix (1 of 2) Never done   DEXA SCAN  Never done   Pneumococcal Vaccine: 50+ Years (3 of 3 - PCV) 05/30/2021   Colonoscopy  04/10/2022   FOOT EXAM  06/26/2022   Diabetic kidney evaluation - Urine ACR  03/28/2024   Influenza Vaccine  04/10/2024   COVID-19 Vaccine (6 - 2025-26 season) 05/11/2024   Medicare Annual Wellness (AWV)  05/20/2024   HEMOGLOBIN A1C  06/10/2024    Colorectal cancer screening: Type of  screening: Colonoscopy. Completed 04/10/17. Repeat every 5 years  Mammogram status: Completed 07/19/2023. Repeat every year  Bone Density status: Ordered PT HAS DECLINED. Pt provided with contact info and advised to call to schedule appt.  Lung Cancer Screening: (Low Dose CT Chest recommended if Age 83-80 years, 20 pack-year currently smoking OR have quit w/in 15years.) does not qualify.   Lung Cancer Screening Referral: n/a  Additional Screening:  Hepatitis C Screening: does qualify; Completed 05/30/2020  Vision Screening: Recommended annual ophthalmology exams for early detection of glaucoma and other disorders of the eye. Is the patient up to date with their annual eye exam?  Yes  Who is the provider or what is the name of the office in which the patient attends annual eye exams? [facility] If pt is not established with a provider, would they like to be referred to a provider to establish care? No .   Dental Screening: Recommended annual dental exams for proper oral hygiene  Diabetic Foot Exam: Diabetic Foot Exam: Overdue, Pt has been advised about the importance in completing this exam. Pt is scheduled for diabetic foot exam on Will call to schedule.  Community Resource Referral / Chronic Care Management: CRR required this visit?  No   CCM required this visit?  No     Plan:     I have personally reviewed and noted the following in the patient's chart:   Medical and social history Use of alcohol, tobacco or illicit drugs  Current medications and supplements including opioid prescriptions. Patient is not currently taking opioid prescriptions. Functional ability and status Nutritional status Physical activity Advanced directives List of other physicians Hospitalizations, surgeries, and ER visits in previous 12 months Vitals Screenings to include cognitive, depression, and falls Referrals and appointments  In addition, I have reviewed and discussed with patient certain  preventive protocols, quality metrics, and best practice recommendations. A written personalized care plan for preventive services as well as general preventive health recommendations were provided to patient.     Arnette LOISE Hoots, CMA   06/11/2024   After Visit Summary: (Mail) Due to this being a telephonic visit, the after visit summary with patients personalized plan was offered to patient via mail   Nurse Notes: Patient has declined to have a dexa done but will complete colonoscopy if it is ordered for her. She got her flu shot last Tuesday and will ask about the pneumonia and shingles vaccines at next visit.

## 2024-06-11 NOTE — Patient Instructions (Signed)
 [name],  Thank you for taking the time for your Medicare Wellness Visit. I appreciate your continued commitment to your health goals. Please review the care plan we discussed, and feel free to reach out if I can assist you further.  Medicare recommends these wellness visits once per year to help you and your care team stay ahead of potential health issues. These visits are designed to focus on prevention, allowing your provider to concentrate on managing your acute and chronic conditions during your regular appointments.  Please note that Annual Wellness Visits do not include a physical exam. Some assessments may be limited, especially if the visit was conducted virtually. If needed, we may recommend a separate in-person follow-up with your provider.  Ongoing Care Seeing your primary care provider every 3 to 6 months helps us  monitor your health and provide consistent, personalized care.   Referrals If a referral was made during today's visit and you haven't received any updates within two weeks, please contact the referred provider directly to check on the status.  Recommended Screenings:  Health Maintenance  Topic Date Due   Zoster (Shingles) Vaccine (1 of 2) Never done   DEXA scan (bone density measurement)  Never done   Pneumococcal Vaccine for age over 62 (3 of 3 - PCV) 05/30/2021   Colon Cancer Screening  04/10/2022   Complete foot exam   06/26/2022   Yearly kidney health urinalysis for diabetes  03/28/2024   Flu Shot  04/10/2024   COVID-19 Vaccine (6 - 2025-26 season) 05/11/2024   Hemoglobin A1C  06/10/2024   Eye exam for diabetics  06/25/2024   Yearly kidney function blood test for diabetes  05/20/2025   Medicare Annual Wellness Visit  06/11/2025   Breast Cancer Screening  07/18/2025   Hepatitis C Screening  Completed   HPV Vaccine  Aged Out   Meningitis B Vaccine  Aged Out   DTaP/Tdap/Td vaccine  Discontinued       06/11/2024    4:55 PM  Advanced Directives   Does Patient Have a Medical Advance Directive? No  Would patient like information on creating a medical advance directive? No - Patient declined   Advance Care Planning is important because it: Ensures you receive medical care that aligns with your values, goals, and preferences. Provides guidance to your family and loved ones, reducing the emotional burden of decision-making during critical moments.  Vision: Annual vision screenings are recommended for early detection of glaucoma, cataracts, and diabetic retinopathy. These exams can also reveal signs of chronic conditions such as diabetes and high blood pressure.  Dental: Annual dental screenings help detect early signs of oral cancer, gum disease, and other conditions linked to overall health, including heart disease and diabetes.  Please see the attached documents for additional preventive care recommendations.

## 2024-06-12 ENCOUNTER — Other Ambulatory Visit: Payer: Self-pay | Admitting: Family Medicine

## 2024-06-12 DIAGNOSIS — Z1211 Encounter for screening for malignant neoplasm of colon: Secondary | ICD-10-CM

## 2024-06-24 ENCOUNTER — Ambulatory Visit (HOSPITAL_COMMUNITY)
Admission: RE | Admit: 2024-06-24 | Discharge: 2024-06-24 | Disposition: A | Discharge status: Home / Self Care | Source: Ambulatory Visit | Attending: Internal Medicine | Admitting: Internal Medicine

## 2024-06-24 VITALS — BP 157/84 | HR 71 | Temp 97.3°F | Resp 17

## 2024-06-24 DIAGNOSIS — D631 Anemia in chronic kidney disease: Secondary | ICD-10-CM | POA: Diagnosis present

## 2024-06-24 DIAGNOSIS — N185 Chronic kidney disease, stage 5: Secondary | ICD-10-CM | POA: Insufficient documentation

## 2024-06-24 LAB — FERRITIN: Ferritin: 253 ng/mL (ref 11–307)

## 2024-06-24 LAB — IRON AND TIBC
Iron: 57 ug/dL (ref 28–170)
Saturation Ratios: 20 % (ref 10.4–31.8)
TIBC: 281 ug/dL (ref 250–450)
UIBC: 224 ug/dL

## 2024-06-24 LAB — POCT HEMOGLOBIN-HEMACUE: Hemoglobin: 10.7 g/dL — ABNORMAL LOW (ref 12.0–15.0)

## 2024-06-24 MED ORDER — DARBEPOETIN ALFA 200 MCG/0.4ML IJ SOSY
200.0000 ug | PREFILLED_SYRINGE | Freq: Once | INTRAMUSCULAR | Status: AC
  Administered 2024-06-24: 200 ug via SUBCUTANEOUS

## 2024-06-24 MED ORDER — DARBEPOETIN ALFA 200 MCG/0.4ML IJ SOSY
PREFILLED_SYRINGE | INTRAMUSCULAR | Status: AC
  Filled 2024-06-24: qty 0.4

## 2024-06-26 ENCOUNTER — Ambulatory Visit (INDEPENDENT_AMBULATORY_CARE_PROVIDER_SITE_OTHER): Admitting: Internal Medicine

## 2024-06-26 ENCOUNTER — Encounter: Payer: Self-pay | Admitting: Internal Medicine

## 2024-06-26 VITALS — BP 132/76 | HR 63 | Temp 98.0°F | Ht 67.0 in | Wt 189.0 lb

## 2024-06-26 DIAGNOSIS — R053 Chronic cough: Secondary | ICD-10-CM | POA: Diagnosis not present

## 2024-06-26 DIAGNOSIS — R0602 Shortness of breath: Secondary | ICD-10-CM | POA: Diagnosis not present

## 2024-06-26 DIAGNOSIS — J329 Chronic sinusitis, unspecified: Secondary | ICD-10-CM

## 2024-06-26 DIAGNOSIS — M3131 Wegener's granulomatosis with renal involvement: Secondary | ICD-10-CM

## 2024-06-26 DIAGNOSIS — R062 Wheezing: Secondary | ICD-10-CM | POA: Diagnosis not present

## 2024-06-26 DIAGNOSIS — F1290 Cannabis use, unspecified, uncomplicated: Secondary | ICD-10-CM

## 2024-06-26 LAB — CBC WITH DIFFERENTIAL/PLATELET
Basophils Absolute: 0.1 K/uL (ref 0.0–0.1)
Basophils Relative: 0.9 % (ref 0.0–3.0)
Eosinophils Absolute: 0.3 K/uL (ref 0.0–0.7)
Eosinophils Relative: 3.5 % (ref 0.0–5.0)
HCT: 34.9 % — ABNORMAL LOW (ref 36.0–46.0)
Hemoglobin: 11 g/dL — ABNORMAL LOW (ref 12.0–15.0)
Lymphocytes Relative: 14.6 % (ref 12.0–46.0)
Lymphs Abs: 1.1 K/uL (ref 0.7–4.0)
MCHC: 31.4 g/dL (ref 30.0–36.0)
MCV: 78.8 fl (ref 78.0–100.0)
Monocytes Absolute: 0.6 K/uL (ref 0.1–1.0)
Monocytes Relative: 7.9 % (ref 3.0–12.0)
Neutro Abs: 5.3 K/uL (ref 1.4–7.7)
Neutrophils Relative %: 73.1 % (ref 43.0–77.0)
Platelets: 157 K/uL (ref 150.0–400.0)
RBC: 4.43 Mil/uL (ref 3.87–5.11)
RDW: 18.7 % — ABNORMAL HIGH (ref 11.5–15.5)
WBC: 7.2 K/uL (ref 4.0–10.5)

## 2024-06-26 LAB — SEDIMENTATION RATE: Sed Rate: 60 mm/h — ABNORMAL HIGH (ref 0–30)

## 2024-06-26 MED ORDER — AZELASTINE HCL 0.1 % NA SOLN: 2.0000 | Freq: Two times a day (BID) | NASAL | 12 refills | Status: AC

## 2024-06-26 NOTE — Patient Instructions (Addendum)
 ICD-10-CM   1. Chronic cough  R05.3     2. Wheezing  R06.2     3. Shortness of breath  R06.02     4. Marijuana smoker  F12.90     5. Wegener's granulomatosis with renal involvement (HCC)  M31.31     6. Chronic sinusitis, unspecified location  J32.9       Chronic cough Wheezing Shortness of breath Marijuana smoker Wegener's granulomatosis with renal involvement (HCC) Chronic sinusitis, unspecified location  - You are describing severe chronic cough.  This could be because of sinus drainage or potential allergy issues or even related to marijuana smoking.  It has been over 6 years since he had CT scans of the chest or the sinuses  Plan - CT scan sinus - High-resolution CT scan chest - Full pulmonary function test - Do blood work for CBC with differential and allergy panel, ESR today - Aim to stop smoking marijuana - Continue Flonase  - Start Astelin nasal spray 2 squirts into each nostril twice daily  VAsculitis Immunosuppressed status Sp Rituxan  repeat March 2022  versus sometime in 2023 (per your history)   - Possible vasculitis might be acting up because of your sinus complaints  Plan  - We will see what the results are -Rest per renal  Emphysema   - stable  plan - continue spiriva  respimat daily -albuterolas needed  CKD -   Per Dr Gearline 07/26/2023 -    Marijuana use  - this is hurting your lung in long run thought seems to help sensation in leg  plan  - please quit aSAP  Followup  - 4 weeks with nurse practitioner Ebb to review results

## 2024-06-26 NOTE — Progress Notes (Signed)
 OV 05/26/2018  Subjective:  Patient ID: Catherine Aguilar, female , DOB: [birth year] , age 68 y.o. , MRN: [id number] , ADDRESS: [address] [place] [address]   OV 02/12/2017  Chief Complaint  Patient presents with   Follow-up    Pt states her breathing is doing well. Pt denies any cough, SOB, CP/tightness, and f/c/s.    Follow-up severe mid vasculitis syndrome with life-threatening alveolar hemorrhage and acute renal failure is easter weekend 2018  Currently she is doing well. She has finished 4 doses of Rituxan . Last dose was 01/31/2017. There was significant delay between the first 2 doses which were on schedule in the third and fourth doses delayed. Still on prednisone  60 mg per day. She is accompanied by her daughter Catherine Aguilar. Overall she's doing well. This no hemoptysis anymore. This no shortness of breath. Her G6PD was normal in May 2018. Her Quantiferon Gold was normal. Her most recent chemistries were in May 2018 and a creatinine that improved with 3 mg percent approximately.  OV 05/03/2017  Chief Complaint  Patient presents with   Follow-up    Pt states her breathing is unchanged since last OV. Pt denies cough, CP/tightness, f/c/s.       68 year old female with systemic vasculitis syndrome. Last dose of Rituxan  was in May 2018. She is currently tapered her prednisone  down to 10 mg per day. She is on Bactrim  prophylaxis. She has visited with nephrology Dr. Patti in the last 1 month. According to her history blood tests have improved. She does not want to have repeat blood test today. Last blood test was within a month. She is getting iron  infusions at short stay. According to the daughter blood pressure was high and one of the infusions was held off. There is no hemoptysis chest pain or hematuria or any clinical evidence of vasculitis. She is due for flu shot.   OV 09/19/2017  Chief Complaint  Patient presents with   Follow-up    Pt was seen at the hospital  09/02/17 due to breathing problems and after doing breathing treatments, was sent home. Other than that, pt states that she has been doing good.     68 year old female with systemic vasculitis syndrome.  Last dose of Rituxan  May 2018.  She had life-threatening hemoptysis and respiratory failure and renal failure back in early 2018.  Last seen in August 2018.  After that she did not follow-up.  She is here with her daughter.  It appears that Christmas Eve 2018 she decided to smoke cigarettes because of the holidays when she got acute respiratory exacerbation with wheezing and went to the emergency department.  I personally reviewed the chart and the chest x-ray that was clear.  She was discharged with a steroid burst.  Now she is back to baseline prednisone  10 mg/day and her baseline Bactrim  for her Wegener's vasculitis.  She tells me that she is getting Rituxan  shots once a month at the short stay hospital at [facility] but when I reviewed the chart this is actually Aranesp .  She is due for a B-cell flow cytometry test today because it has been 9-10 months.  Overall she is feeling fine except fatigue.  She tells me that she does not have any eye issues or neuritis of mononeuritis multiplex or renal issues although most recent creatinine continues to be on 3 mg percent.  There is no hemoptysis or shortness of breath currently.   OV 01/21/2018  Chief Complaint  Patient presents with   Follow-up    Pt states she has been doing good since last visit but states she has an uncontrollable cough that she has had since March 2018 that will not go away. Pt is not coughing up any mucus but does have postnasal drainage that is clear.    68 year old female with systemic vasculitis syndrome with life-threatening hemoptysis and respiratory and renal failure in early 2018.SABRA  Last dose of Rituxan  May 2018.  Since then on daily prednisone  and Bactrim  prophylaxis.  Last B-cell flow cytometry January 2019 showing  suppression of B cells.  She presents with her daughter.  She as usual is a poor historian.  As best as I can gather it appears since her last visit she is having recurrent episodes of bronchitis and has been treated with 2 rounds of antibiotics/2 rounds of prednisone  burst including a February 2019 visit to the emergency department where she had a chest x-ray that I personally visualized and it looks clear.  She also feels that she is having a chronic cough that is worse than her baseline.  She tells me now that she has had this cough since March 2018 when she suffered from vasculitis syndrome but the daughter says that it is certainly worse in the last few months since the last visit associated with chest tightness.  There is no hemoptysis or edema.  In the last week or so she feels like she has had an acute sinusitis with yellow sputum and she wants a different antibiotic.  There is no fever or hemoptysis or edema orthopnea proximal nocturnal dyspnea or weight loss. FeNO 8ppb and does not suggest asthma    05/26/2018 -   Chief Complaint  Patient presents with   Follow-up    Pt states she has been doing well since last visit except she states still has the uncontrollable cough that she is unable to get rid of. States she is coughing up clear to light yellow to green mucus and has some occ SOB when exerting herself.    68 year old female with systemic vasculitis syndrome with life-threatening hemoptysis and respiratory and renal failure in early 2018.SABRA  Last dose of Rituxan  May 2018.  Since then on daily prednisone  and Bactrim  prophylaxis.  Last B-cell flow cytometry January 2019 showing suppression of B cells but in summer 2019 emergence with 3% B cells associated with rising MPO tites in renal clinic summer 2019. Rx for relapose 1000mg  IV rituxan  - May 08, 2018. Prednison not in [facility]  Sept 2019   Catherine Aguilar 68 y.o. -presents for follow-up. Last seen in May 2019. At that time B-cell flow  cytometry was repeated and showed a 3% B-cell population which was the beginning of emergence following Rituxan  a year earlier. At that point in her she was feeling okay but she was still smoking. She also had chronic cough which was controlled by  trelegy inhaler. She subsequently saw Dr. Betsey in nephrology and it was noticed that her MPO titer was coming back and clinically Dr. Betsey felt that she was relapsing. Therefore we arranged for single dose Rituxan  thousand milligrams which was administered end of August 2019 without any problems. At this point in time her main issues that she continues to smoke. She has a chronic cough which she says is severe but happens only every few weeks or every several days. She is not taking her inhaler regularly. Review of radiology showed last CT scan of the chestwas early in 2018  and a chest x-ray in February 2019 that was clear. She continues on Bactrim . I do not see prednisone  thought at last viit instructed to take pred 5mg  per day but she says she finished it which was not her instruction    OV 09/23/2018  Subjective:  Patient ID: Catherine Aguilar, female , DOB: 1956/05/19 , age 6 y.o. , MRN: [id number] , ADDRESS: [address] [place] [address]   68 year old female with systemic vasculitis syndrome with life-threatening hemoptysis and respiratory and renal failure in early 2018.SABRA  Last dose of Rituxan  May 2018.  Since then on daily prednisone  and Bactrim  prophylaxis.  Last B-cell flow cytometry January 2019 showing suppression of B cells but in summer 2019 emergence with 3% B cells associated with rising MPO tites in renal clinic summer 2019. Rx for relapose (based on b cells and creat rise) 1000mg  IV rituxan  - May 08, 2018. Prednison not in [facility]  Sept 2019, Jan 2020 but she is on bactrim  for prophylaxis   09/23/2018 -   Chief Complaint  Patient presents with   Follow-up    Pt states she has been doing well since last visit and denies any complaints.      HPI Catherine Aguilar 68 y.o. -presents for follow-up of the above issue.  She presents by herself.  As usual she is a poor historian.  This is evidenced by the fact that she does not know that she suffers from vasculitis.  She has no idea what this time even means.  She has no idea that she received Rituxan  although she recollects receiving infusion.  Overall she tells me that she is well except for mild chronic cough.  She continues to smoke.  Her last Rituxan  infusion was in August 2019.  Then in November 2019 she had a CT scan of the chest that shows new findings of coronary artery calcification.  In addition she is noted to have emphysema.  No obvious evidence of any Wegener's vasculitis.  She is up-to-date with her respiratory vaccines.  She continues on Bactrim  prophylaxis but is not on any inhaler therapy other than pro-air.  She is not sure about quitting smoking.    OV 05/07/2019  Subjective:  Patient ID: Catherine Aguilar, female , DOB: [birth year] , age 21 y.o. , MRN: [id number] , ADDRESS: [address] [place] [address]   05/07/2019 -   Chief Complaint  Patient presents with   Systemic Vaculitis Syndrome    No changes in breathing since last visit in January 2020. Medication usage still the same.     68 year old female with systemic vasculitis syndrome with life-threatening hemoptysis and respiratory and renal failure in early 2018.s/p induction Rituxan  ending  May 2018.  Tthen on daily prednisone  and Bactrim  prophylaxis.  Last B-cell flow cytometry January 2019 showing suppression of B cells but in summer 2019 emergence with 3% B cells associated with rising MPO tites in renal clinic summer 2019. Rx for relapose (based on b cells and creat rise) 1000mg  IV rituxan  - May 08, 2018. Prednison not in [facility]  Sept 2019, Jan 2020 and Aug 2020 but she is on bactrim  for prophylaxis  Follow-up vasculitis Follow-up emphysema Follow-up smoking  HPI Manuella Rylander 68 y.o. -presents for  follow-up.  Last visit was in January 2020 at which time she was in clinical remission from a vasculitis.  I recommended cardiology follow-up for pulmonary artery calcification.  She had a myocardial perfusion test in February 2020 and this was normal.  She  continues to do well.  Her last Rituxan  was in August 2019.  She is now worried that her vasculitis is back because she had one episode of epistaxis when she woke up in the middle of the night this was just brief when she blew her nose.  Other than that she is not had any other episode.  She not having any sinus issues.  No respiratory issues.  She says she is renal and her kidney numbers are actually doing better.  She has chronic kidney disease with a creatinine of over 2 in our health system.  She continues with Bactrim  but she is not on prednisone .   For emphysema: Last visit I started on Spiriva  but I do not see this:  For smoking: She continues to smoke cigarettes.     OV 02/16/2020  Subjective:  Patient ID: Catherine Aguilar, female , DOB: 08-01-1956 , age 30 y.o. , MRN: [id number] , ADDRESS: [address] [place] [address]   02/16/2020 -   Chief Complaint  Patient presents with   Follow-up    Pt states she has been doing okay since last visit. States she still becomes SOB and has been coughing a lot. Pt states she will occ cough up beige to light green phlegm.   Follow-up emphysema: On Spiriva  Follow-up smoking: Quit summer 2020 Follow-up immunosuppressed status due to vasculitis: On Bactrim  prophylaxis Follow-up anemia: On Aranesp  monitored by renal. Follow-up vasculitis  - systemic vasculitis syndrome with life-threatening hemoptysis and respiratory and renal failure in early 2018.s/p induction Rituxan  ending  May 2018.  Tthen on daily prednisone  and Bactrim  prophylaxis.  Last B-cell flow cytometry January 2019 showing suppression of B cells but in summer 2019 emergence with 3% B cells associated with rising MPO tites in renal  clinic summer 2019.   -  Rx for relapose (based on b cells and creat rise) 1000mg  IV rituxan  - May 08, 2018.,  80 mg IV x1 October 21, 2019  -Off prednisone  because she could not get a refill [Prednison not in [facility]  Sept 2019, Jan 2020 and Aug 2020 and June 2021)  -  On bactrim  for prophylaxis  HPI Catherine Aguilar 68 y.o. -returns for follow-up.  Last seen in the summer 2020.  Reviewed by myself.  In the interim she tells me that she has quit smoking.  She says dyspnea stable.  Present on exertion relieved by rest.  No hemoptysis no vasculitis symptoms.  She maintains herself on Spiriva .  Initially she said her last Rituxan  was in September 2020.  I question the several times to her and she kept repeating it was September 2020.  Later we reviewed the records from Dr. Almarie Bonine her nephrologist and her last Rituxan  was in February 2021.  Moving forward I believe Dr. Bonine wants me to be in charge of the Rituxan .  Earlier this year she had sinusitis symptoms with bloody nasal clots and shortness of breath and wheezing.  She was treated with conventional antibiotics and prednisone  by our nurse practitioner.  Dr. Bonine did give 1 dose of Rituxan  800 mg on October 21, 2019.  Since this Rituxan  dosage her sinus symptoms and her respiratory symptoms have improved.  In April 2020 when she had MPO antibody tested by Dr. Bonine and this was elevated.     OV 06/07/2020   Subjective:  Patient ID: Catherine Aguilar, female , DOB: 10-24-1955, age 31 y.o. years. , MRN: [id number],  ADDRESS: [address] [place] [address]  PCP  Lenon Chiquita BROCKS, DO Providers : Treatment Team:  Attending Provider: [name], MD   Chief Complaint  Patient presents with   Follow-up    no complaints     Follow-up emphysema: On Spiriva  Follow-up smoking: Quit summer 2020 Followup MJ smoking: admitted this to Sept 2021 (+ve urine march2018) Follow-up immunosuppressed status due to vasculitis: On Bactrim   prophylaxis Follow-up anemia: On Aranesp  monitored by renal. Follow-up vasculitis  - systemic vasculitis syndrome with life-threatening hemoptysis and respiratory and renal failure in early 2018.s/p induction Rituxan  ending  May 2018.  Tthen on daily prednisone  and Bactrim  prophylaxis.  Last B-cell flow cytometry January 2019 showing suppression of B cells but in summer 2019 emergence with 3% B cells associated with rising MPO tites in renal clinic summer 2019.   -  Rx for relapose (based on b cells and creat rise) 1000mg  IV rituxan  - May 08, 2018.,  80 mg IV x1 October 21, 2019  -Off prednisone  because she could not get a refill [Prednison not in [facility]  Sept 2019, Jan 2020 and Aug 2020 and June 2021 and sept 2021)  -  On bactrim  for prophylaxis   HPI Catherine Aguilar 68 y.o. -returns for routine follow-up. In the interim she called in for an exacerbation and we gave her prednisone  antibiotic. At this point in time she is not on chronic prednisone . She is on Spiriva . She feels well. No hemoptysis no respiratory complaints. She says she has had a Covid vaccine but is not documented. She says she had Pfizer. She also states that the season she has had her flu and pneumonia vaccines. She is not had a shingles vaccine. She does not realize that she is immunosuppressed and needs a Covid booster. She think she has to wait 6 months for Covid booster but it explained to her the literature about getting a booster on immunosuppression. Her last Rituxan  was in Feb 2021. She is willing to get monitoring test labs. She is immunosuppressed.  CT Chest  IMPRESSION: 1. No compelling findings of interstitial lung disease at this time. 2. Small parenchymal bands scattered in the mid to lower lungs bilaterally, left greater than right, most compatible with mild nonspecific postinfectious/postinflammatory scarring. 3. Mild centrilobular emphysema with mild diffuse bronchial wall thickening, suggesting COPD. 4.  Stable mild cardiomegaly and small pericardial effusion. 5. Three-vessel coronary atherosclerosis.   Aortic Atherosclerosis (ICD10-I70.0) and Emphysema (ICD10-J43.9).     Electronically Signed   By: Selinda DELENA Blue M.D.   On: 08/01/2018 13:  ROS - per HPI  Results for JINNIFER, MONTEJANO (MRN [id number]) as of 02/16/2020 09:19  Ref. Range 05/27/2019 08:46  December 10, 2019 done by renal  Myeloperoxidase Abs Latest Units: AI 2.3 (H)  50.5  Serine Protease 3 Latest Units: AI <1.0  less than 3.5  P-ANCA    1: 640  C ANCA    less than 1: 20   ROS - per HPI   Results for MARNEY, TRELOAR (MRN [id number]) as of 06/07/2020 13:38  Ref. Range 12/05/2017 11:15 01/02/2018 12:17 01/21/2018 10:54 01/30/2018 11:30 03/04/2018 10:31 04/03/2018 10:36 05/08/2018 09:16 05/26/2018 10:50 06/05/2018 10:52 07/03/2018 11:08 07/31/2018 11:30 08/28/2018 11:15 10/16/2018 10:56 11/13/2018 12:00 01/15/2019 10:46 02/12/2019 11:00 04/02/2019 12:07 05/07/2019 10:01 05/27/2019 08:46 05/28/2019 13:09 06/19/2019 12:21 07/31/2019 09:54 08/19/2019 15:17 09/15/2019 10:14 10/13/2019 10:30 11/10/2019 10:22 01/05/2020 10:12 02/02/2020 10:07 02/16/2020 09:19 03/21/2020 09:51 04/18/2020 08:34 05/30/2020 10:45  Creatinine Latest Ref Range: 0.40 - 1.20 mg/dL   7.26 (H)  2.98 (H)          2.66 (H) 3.29 (H)    2.40 (H)      2.76 (H)       OV 11/11/20 with APP Tammy Parerentt  Per Dr. Geronimo : Discussed with Dr. Gearline of renal.  She says the patient's kidney function is normal.  Blood labs are also okay.  Patient is feeling okay.  However the ANCA vasculitis antibody MPO was going up significantly.  It has been 1 year since patient's last dose of Rituxan .  She feels that the disease is going to get active.  She is recommending another dose of Rituxan  within the next 1 month  Case discussed with Dr. Geronimo.  Dr. Geronimo spoke with nephrology.  Recent lab work from renal showed MPO antibody is trending up indicative of active vasculitis.  With recommendations to set up a  Rituxan  infusion .  Advised to use Rituxan  protocol. Reach out to pharmacy Millicent Blanch for protocol questions.   We will set up as previously done last in February 2021. Renal notes from October 24, 2020 reviewed and showed MPO antibody greater than 100 P ANCA 1: 640 ESR 40 serum creatinine 2.67 GFR 21 hemoglobin 11.8  Patient will be set up for Rituxan   infusion per protocol with Rituxan  800 mg (375 mg/M2 ) IV x1. Predose with Tylenol  and Benadryl  . Continue on Bactrim  DS for PJP proph. 3d/wk  Patient is to follow-up in 4 weeks with Dr. Geronimo in the office for a follow-up visit.    OV 01/30/2021  Subjective:  Patient ID: Catherine Aguilar, female , DOB: 27-Sep-1955 , age 56 y.o. , MRN: [id number] , ADDRESS: [address] [place] [address] PCP Lenon Chiquita BROCKS, DO Patient Care Team: Lenon Chiquita BROCKS, DO as PCP - General (Family Medicine)  This Provider for this visit: Treatment Team:  Attending Provider: [name], MD    01/30/2021 -   Chief Complaint  Patient presents with   Follow-up    Pt states she has been doing okay since last visit. States if she gets hot, she will have an uncontrollable cough and will occ cough up phlegm.    Follow-up emphysema: On Spiriva  Follow-up smoking: Quit summer 2020 Followup MJ smoking: admitted this to Sept 2021 (+ve urine march2018) Follow-up immunosuppressed status due to vasculitis: On Bactrim  prophylaxis Follow-up anemia: On Aranesp  monitored by renal. Follow-up vasculitis  - systemic vasculitis syndrome with life-threatening hemoptysis and respiratory and renal failure in early 2018.s/p induction Rituxan  ending  May 2018.  Tthen on daily prednisone  and Bactrim  prophylaxis.  Last B-cell flow cytometry January 2019 showing suppression of B cells but in summer 2019 emergence with 3% B cells associated with rising MPO tites in renal clinic summer 2019.   -  Rx for relapose (based on b cells and creat rise) 1000mg  IV rituxan  -  May 08, 2018.,  80 mg IV x1 October 21, 2019 -> repeat March 2022  -Off prednisone  because she could not get a refill [Prednison not in [facility]  Sept 2019, Jan 2020 and Aug 2020 and June 2021,  sept 2021, may 2022)  -  On bactrim  for prophylaxis   Quantiferon gold - negtive Sept 2021  HPI Catherine Aguilar 68 y.o. -returns for follow-up.  Last seen by myself in September 2021.  After that she was seen by nurse practitioner in March 2022.  The spring 2022 visit was following nephrology concerns of the vasculitis was reemerging.  Patient  was able to see nurse practitioner and we set her up for Rituxan  infusion.  She says she has gotten the Rituxan  infusion mid March 2022.  After that her sinus symptoms have improved a lot.  She says she feels good except when she exerts the summer heat gets hot then she starts coughing.  She continues to smoke marijuana.  She is refusing to quit.  She uses albuterol  as needed.  She is not using her Spiriva  on a regular basis.  She is unclear why she is not using Spiriva  on a regular basis.  She says she has a lot of inhalers at home and she does not know them other than albuterol .  She does not recollect the fact she has been prescribed Spiriva  in the past and instructed to take it on a regular basis.  She is willing to give this a try.  There are no other new issues.  Overall health status is stable.  In terms of vaccination: She says she has had of 4 shots of COVID-vaccine.  Unclear if she got monoclonal antibody prophylaxis.  Last time when she was here we checked her COVID IgG and it was negative and I recommended monoclonal antibody prophylaxis but she does not remember this.  She is willing to have a COVID IgG checked.  Also unclear if she has had a shingles vaccine.  Gave instructions about it last time.      OV 06/01/2021  Subjective:  Patient ID: Catherine Aguilar, female , DOB: Aug 20, 1956 , age 33 y.o. , MRN: [id number] , ADDRESS: [address] [place]  [address] PCP Hardy Pears, DO Patient Care Team: Ganta, Anupa, DO as PCP - General (Family Medicine)  This Provider for this visit: Treatment Team:  Attending Provider: [name], MD    06/01/2021 -   Chief Complaint  Patient presents with   Follow-up    Pt states she has been doing okay since last visit and denies any complaints.     HPI Catherine Aguilar 68 y.o. -returns for follow-up 6 months.  She is doing well.  She occasionally uses marijuana because it helps sensory symptoms in her right lower extremity.  Otherwise she is feeling well no complaints.  She has not had a COVID booster.  She will have a high-dose flu shot today.  She believes she gets Rituxan  once a year.  Last one was in spring 2022.  At this point in time no hemoptysis or dysuria or hematuria.  No arthritis no rashes.  Last set of labs was in spring 2022.  She does not feel inclination to quit marijuana.  She continues with Spiriva  for the emphysema.  OV 07/26/2023  Subjective:  Patient ID: Catherine Aguilar, female , DOB: [birth year] , age 54 y.o. , MRN: [id number] , ADDRESS: [address] [place] [address] PCP Christia Budds, MD Patient Care Team: Christia Budds, MD as PCP - General (Family Medicine)  This Provider for this visit: Treatment Team:  Attending Provider: [name], MD    07/26/2023 -   Chief Complaint  Patient presents with   Follow-up    Breathing is unchanged since the last visit. She has occ coughing spell- non prod.     HPI Catherine Aguilar 68 y.o. -returns for follow-up.  Last seen 2 years ago in September 2022.  She had called the office because she wanted her Bactrim  refilled.  And because it is been 2 years she was scheduled.  She tells me overall her health  status has been stable and there have been no new medical problems.  No admissions no ER visits no urgent care visits no surgeries no medication changes.  But review of the external medical record  indicates this is all incorrect.  She continues to have significant anemia.  In addition in the summer of this year creatinine went up to 7 mg percent.  She then had a renal biopsy in September 2024 that did not show any active vasculitis but showed fibrosis.  I discussed with Dr. Almarie Bonine her nephrologist on the phone.  She says there is no active vasculitis but patient has progressed to CKD and is heading towards end-stage renal disease.  And ultimately dialysis at some point.  Patient continues to take Bactrim .  I discussed with Dr. Bonine the last Rituxan  was late 2023.  Patient also concerned this by history.  We both collectively felt there is no need for Bactrim  given the fact it has been over a year since Rituxan .  Have communicated this with the patient.  In terms of her respiratory status she continues to take Spiriva  she is denying any shortness of breath cough or wheezing but she continues to smoke marijuana on an as-needed basis.  When asked if as needed this every day she did not deny this.  Last CT scan of the chest was in 2019.       OV 06/26/2024  Subjective:  Patient ID: Catherine Aguilar, female , DOB: Apr 23, 1956 , age 2 y.o. , MRN: [id number] , ADDRESS: [address]  [address] [place] [address] PCP Adele Song, MD Patient Care Team: Adele Song, MD as PCP - General (Family Medicine)  This Provider for this visit: Treatment Team:  Attending Provider: [name], MD   Follow-up emphysema: On Spiriva  Follow-up smoking: Quit summer 2020 Followup MJ smoking: admitted this to Sept 2021 and may 2022 (+ve urine march2018) Follow-up immunosuppressed status due to vasculitis: On Bactrim  prophylaxis Follow-up anemia: On Aranesp  monitored by renal. Follow-up vasculitis  - systemic vasculitis syndrome with life-threatening hemoptysis and respiratory and renal failure in early 2018.s/p induction Rituxan  ending  May 2018.  Tthen on daily prednisone  and Bactrim  prophylaxis.   Last B-cell flow cytometry January 2019 showing suppression of B cells but in summer 2019 emergence with 3% B cells associated with rising MPO tites in renal clinic summer 2019.   -  Rx for relapose (based on b cells and creat rise) 1000mg  IV rituxan  - May 08, 2018.,  80 mg IV x1 October 21, 2019 -> repeat March 2022  -Off prednisone  because she could not get a refill [Prednison not in [facility]  Sept 2019, Jan 2020 and Aug 2020 and June 2021,  sept 2021, may 2022), late 2023 by DR Bonine per her history   -  On bactrim  for prophylaxis  - > stopped 07/26/23 after d/w Dr Bonine Bending gold - negtive Sept 2021  06/26/2024 -   Chief Complaint  Patient presents with   Follow-up    Pt states since LOVE breathing is the same nothing has changed SOB w/ exertion Prod cough ( phlegm clear) also Dry cough Sinus infection still around      HPI Catherine Aguilar 68 y.o. -returns for follow-up.  Personally not seen in 11 months.  Aware to call and make this appointment.  She says overall she is stable.  She has chronic anemia and she is getting heme infusions.  Is being managed by renal.  She states the  CKD is stable and Dr. Gearline is following.  She continues to smoke marijuana.  Her major complaint is that still with sinus drainage that significant and uncontrollable cough.  She says been going on for a long time.  I indicated to her that this was news to me.  She said I lied in the past about my cough.  And is actually quite bad.  She says that she can almost fall of the floor.  Occasionally she has been found on the ground.  She is coughing every other day or every few days and episodes.  It gets a little bit better with water.  She says she has never had syncope but has come close.  There is also associated wheezing and shortness of breath.  She calls it annoying.  She is on Anoro She is on Flonase  She is not taking Astelin   She is off Bactrim  She has been off Rituxan  for 2  years.   Review of external medical records indicate it has been over 5 years since she has had CT scan sinus or CT scan chest.   Lab review most send hemoglobin 10 g% October 2025.     LAB RESULTS last 96 hours No results found.       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), CVA (cerebral vascular accident) (HCC) (07/03/2012), Diffuse pulmonary alveolar hemorrhage, Edema of right lower extremity (03/25/2017), Essential hypertension (12/08/2016), Hemoptysis (12/08/2016), High cholesterol, HLD (hyperlipidemia) (12/08/2016), adenomatous polyp of colon (04/16/2017), Hypertension, Hypokalemia (07/02/2012), 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   reports that she quit smoking about 5 years ago. Her smoking use included cigarettes. She started smoking about 55 years ago. She has a 10.1 pack-year smoking history. She has never used smokeless tobacco.  Past Surgical History:  Procedure Laterality Date   ABDOMINAL HYSTERECTOMY  1985    No Known Allergies  Immunization History  Administered Date(s) Administered   Fluad Quad(high Dose 65+) 06/01/2021   Fluad Trivalent(High Dose 65+) 07/26/2023   INFLUENZA, HIGH DOSE SEASONAL PF 05/27/2024    Influenza Split 07/02/2012   Influenza,inj,Quad PF,6+ Mos 05/03/2017, 05/13/2018, 06/08/2019   PFIZER Comirnaty(Gray Top)Covid-19 Tri-Sucrose Vaccine 03/05/2020, 03/26/2020, 10/10/2020, 01/23/2021   Pfizer Covid-19 Vaccine Bivalent Booster 54yrs & up 06/26/2021   Pneumococcal Polysaccharide-23 07/02/2012, 11/22/2017, 05/30/2020    Family History  Problem Relation Age of Onset   Heart attack Mother    Diabetes Mother    Hypertension Mother    Stroke Mother    Cancer Father    Alcohol abuse Father    Diabetes Father    Hypertension Father    Breast cancer Neg Hx    Colon cancer Neg Hx    Esophageal cancer Neg Hx    Rectal cancer Neg Hx    Stomach cancer Neg Hx    Pancreatic cancer Neg Hx    BRCA 1/2 Neg Hx      Current Outpatient Medications:    albuterol  (PROVENTIL ) (2.5 MG/3ML)  0.083% nebulizer solution, Take 3 mLs (2.5 mg total) by nebulization every 6 (six) hours as needed for wheezing or shortness of breath., Disp: 150 mL, Rfl: 1   albuterol  (VENTOLIN  HFA) 108 (90 Base) MCG/ACT inhaler, INHALE 2 PUFFS BY MOUTH EVERY 4-6 HOURS, Disp: 8.5 each, Rfl: 2   amLODipine  (NORVASC ) 10 MG tablet, Take 10 mg by mouth in the morning., Disp: , Rfl:    aspirin  81 MG EC tablet, TAKE 1 TABLET BY MOUTH EVERY DAY, Disp: 32 tablet, Rfl: 12   atorvastatin  (LIPITOR) 20 MG tablet, Take 1 tablet (20 mg total) by mouth daily., Disp: 30 tablet, Rfl: 0   azelastine (ASTELIN) 0.1 % nasal spray, Place 2 sprays into both nostrils 2 (two) times daily. Use in each nostril as directed, Disp: 30 mL, Rfl: 12   B-D UF III MINI PEN NEEDLES 31G X 5 MM MISC, USE AS DIRECTED, Disp: 100 each, Rfl: 0   doxazosin  (CARDURA ) 2 MG tablet, Take 2 mg by mouth at bedtime., Disp: , Rfl:    fluticasone  (FLONASE ) 50 MCG/ACT nasal spray, Place 2 sprays into both nostrils daily., Disp: 16 g, Rfl: 2   glucose blood (ACCU-CHEK GUIDE) test strip, Check blood glucose prior to lantus  administration, Disp: 100 each, Rfl: 12   insulin   glargine (LANTUS  SOLOSTAR) 100 UNIT/ML Solostar Pen, Inject 5 Units into the skin at bedtime., Disp: 15 mL, Rfl: 1   labetalol  (NORMODYNE ) 300 MG tablet, Take 300 mg by mouth 2 (two) times daily., Disp: , Rfl:    Lancets Misc. (ACCU-CHEK SOFTCLIX LANCET DEV) KIT, Check BG prior to administering insulin , Disp: 1 kit, Rfl: 3   lidocaine  (LIDODERM ) 5 %, Place 1 patch onto the skin daily. Remove & Discard patch within 12 hours or as directed by MD, Disp: 30 patch, Rfl: 0   pravastatin  (PRAVACHOL ) 40 MG tablet, Take 40 mg by mouth at bedtime., Disp: , Rfl:    predniSONE  (DELTASONE ) 20 MG tablet, 2 tabs po daily x 4 days, Disp: 8 tablet, Rfl: 0   sodium bicarbonate  650 MG tablet, Take 2 tablets (1,300 mg total) by mouth 2 (two) times daily., Disp: 60 tablet, Rfl: 1   torsemide (DEMADEX) 100 MG tablet, Take 50-100 mg by mouth daily as needed., Disp: , Rfl:    guaiFENesin  (ROBITUSSIN) 100 MG/5ML liquid, Take 5 mLs by mouth every 4 (four) hours as needed for cough or to loosen phlegm. (Patient not taking: Reported on 06/26/2024), Disp: , Rfl:    levofloxacin  (LEVAQUIN ) 500 MG tablet, Take 1 tablet (500 mg total) by mouth every other day. (Patient not taking: Reported on 06/26/2024), Disp: 1 tablet, Rfl: 0   umeclidinium-vilanterol (ANORO ELLIPTA ) 62.5-25 MCG/ACT AEPB, Inhale 1 puff into the lungs daily. (Patient not taking: Reported on 06/26/2024), Disp: 60 each, Rfl: 0      Objective:   Vitals:   06/26/24 1150  BP: 132/76  Pulse: 63  Temp: 98 F (36.7 C)  TempSrc: Oral  SpO2: 98%  Weight: 189 lb (85.7 kg)  Height: 5' 7 (1.702 m)    Estimated body mass index is 29.6 kg/m as calculated from the following:   Height as of this encounter: 5' 7 (1.702 m).   Weight as of this encounter: 189 lb (85.7 kg).  @WEIGHTCHANGE @  [employer]   06/26/24 1150  Weight: 189 lb (85.7 kg)     Physical Exam   General: No distress. Looks well O2 at rest: no Cane present: no Sitting in wheel chair:  no Frail: no Obese: yes Neuro: Alert and Oriented x 3. GCS 15. Speech normal Psych: Pleasant Resp:  Barrel Chest - no.  Wheeze - no, Crackles - no, No overt respiratory distress CVS: Normal heart sounds. Murmurs - no Ext: Stigmata of Connective Tissue Disease - no HEENT: Normal upper airway. PEERL +. No post nasal drip        Assessment/     Assessment & Plan Wegener's granulomatosis with renal involvement (HCC)  Chronic cough  Wheezing  Shortness of breath  Marijuana smoker  Chronic sinusitis, unspecified location    PLAN Patient Instructions     ICD-10-CM   1. Chronic cough  R05.3     2. Wheezing  R06.2     3. Shortness of breath  R06.02     4. Marijuana smoker  F12.90     5. Wegener's granulomatosis with renal involvement (HCC)  M31.31     6. Chronic sinusitis, unspecified location  J32.9       Chronic cough Wheezing Shortness of breath Marijuana smoker Wegener's granulomatosis with renal involvement (HCC) Chronic sinusitis, unspecified location  - You are describing severe chronic cough.  This could be because of sinus drainage or potential allergy issues or even related to marijuana smoking.  It has been over 6 years since he had CT scans of the chest or the sinuses  Plan - CT scan sinus - High-resolution CT scan chest - Full pulmonary function test - Do blood work for CBC with differential and allergy panel, ESR today - Aim to stop smoking marijuana - Continue Flonase  - Start Astelin nasal spray 2 squirts into each nostril twice daily  VAsculitis Immunosuppressed status Sp Rituxan  repeat March 2022  versus sometime in 2023 (per your history)   - Possible vasculitis might be acting up because of your sinus complaints  Plan  - We will see what the results are -Rest per renal  Emphysema   - stable  plan - continue spiriva  respimat daily -albuterolas needed  CKD -   Per Dr Gearline 07/26/2023 -    Marijuana use  - this is  hurting your lung in long run thought seems to help sensation in leg  plan  - please quit aSAP  Followup  - 4 weeks with nurse practitioner Ebb to review results    FOLLOWUP    Return for  - 4 weeks with nurse practitioner Ebb to review results.    SIGNATURE    Dr. Dorethia Cave, M.D., F.C.C.P,  Pulmonary and Critical Care Medicine Staff Physician, [facility] Center Director - Interstitial Lung Disease  Program  Pulmonary Fibrosis [facility] [place], [place], [address]  Pager: [phone], If no answer or between  15:00h - 7:00h: call [phone] Telephone: [phone]  1:31 PM 06/26/2024

## 2024-06-29 ENCOUNTER — Other Ambulatory Visit: Payer: Self-pay

## 2024-06-29 DIAGNOSIS — J4489 Other specified chronic obstructive pulmonary disease: Secondary | ICD-10-CM

## 2024-06-29 LAB — ALLERGEN PROFILE, PERENNIAL ALLERGEN IGE

## 2024-06-29 MED ORDER — ALBUTEROL SULFATE HFA 108 (90 BASE) MCG/ACT IN AERS
INHALATION_SPRAY | RESPIRATORY_TRACT | 2 refills | Status: AC
Start: 2024-06-29 — End: ?

## 2024-07-06 ENCOUNTER — Emergency Department (HOSPITAL_COMMUNITY): Admission: EM | Admit: 2024-07-06 | Discharge: 2024-07-06 | Discharge status: Against Medical Advice

## 2024-07-06 ENCOUNTER — Other Ambulatory Visit: Payer: Self-pay

## 2024-07-06 ENCOUNTER — Emergency Department (HOSPITAL_COMMUNITY)

## 2024-07-06 DIAGNOSIS — R059 Cough, unspecified: Secondary | ICD-10-CM | POA: Insufficient documentation

## 2024-07-06 DIAGNOSIS — J449 Chronic obstructive pulmonary disease, unspecified: Secondary | ICD-10-CM | POA: Insufficient documentation

## 2024-07-06 DIAGNOSIS — Z5321 Procedure and treatment not carried out due to patient leaving prior to being seen by health care provider: Secondary | ICD-10-CM | POA: Diagnosis not present

## 2024-07-06 DIAGNOSIS — R0789 Other chest pain: Secondary | ICD-10-CM | POA: Insufficient documentation

## 2024-07-06 LAB — BASIC METABOLIC PANEL WITH GFR
Anion gap: 19 — ABNORMAL HIGH (ref 5–15)
BUN: 115 mg/dL — ABNORMAL HIGH (ref 8–23)
CO2: 16 mmol/L — ABNORMAL LOW (ref 22–32)
Calcium: 8.4 mg/dL — ABNORMAL LOW (ref 8.9–10.3)
Chloride: 103 mmol/L (ref 98–111)
Creatinine, Ser: 12.18 mg/dL — ABNORMAL HIGH (ref 0.44–1.00)
GFR, Estimated: 3 mL/min — ABNORMAL LOW (ref >60)
Glucose, Bld: 112 mg/dL — ABNORMAL HIGH (ref 70–99)
Potassium: 4.4 mmol/L (ref 3.5–5.1)
Sodium: 138 mmol/L (ref 135–145)

## 2024-07-06 LAB — TROPONIN I (HIGH SENSITIVITY): Troponin I (High Sensitivity): 12 ng/L (ref <18)

## 2024-07-06 LAB — CBC
HCT: 34 % — ABNORMAL LOW (ref 36.0–46.0)
Hemoglobin: 10.2 g/dL — ABNORMAL LOW (ref 12.0–15.0)
MCH: 24.9 pg — ABNORMAL LOW (ref 26.0–34.0)
MCHC: 30 g/dL (ref 30.0–36.0)
MCV: 82.9 fL (ref 80.0–100.0)
Platelets: 138 K/uL — ABNORMAL LOW (ref 150–400)
RBC: 4.1 MIL/uL (ref 3.87–5.11)
RDW: 19.3 % — ABNORMAL HIGH (ref 11.5–15.5)
WBC: 8.4 K/uL (ref 4.0–10.5)
nRBC: 0 % (ref 0.0–0.2)

## 2024-07-06 MED ORDER — LIDOCAINE 5 % EX PTCH: 1.0000 | MEDICATED_PATCH | Freq: Once | CUTANEOUS | Status: DC

## 2024-07-06 MED ORDER — ACETAMINOPHEN 500 MG PO TABS: 1000.0000 mg | ORAL_TABLET | Freq: Once | ORAL | Status: DC

## 2024-07-06 NOTE — ED Notes (Signed)
NA for vitals x2 

## 2024-07-06 NOTE — ED Provider Triage Note (Signed)
 Emergency Medicine Provider Triage Evaluation Note  Catherine Aguilar , a 69 y.o. female  was evaluated in triage.  Pt complains of cough which is causing left-sided chest wall pain.  Worsened with movement, twisting and talking.  Review of Systems  Positive: Chest pain Negative: Nausea vomiting  Physical Exam  BP (!) 180/79 (BP Location: Right Arm)   Pulse (!) 58   Temp 97.8 F (36.6 C) (Oral)   Resp 17   Ht 5' 7 (1.702 m)   Wt 84.8 kg   SpO2 97%   BMI 29.29 kg/m  Gen:   Awake, no distress   Resp:  Normal effort  MSK:   Moves extremities without difficulty  Other:  Clear lungs  Medical Decision Making  Medically screening exam initiated at 8:46 PM.  Appropriate orders placed.  Catherine Aguilar was informed that the remainder of the evaluation will be completed by another provider, this initial triage assessment does not replace that evaluation, and the importance of remaining in the ED until their evaluation is complete.  68 year old with left chest wall pain in the setting of COPD and cough.  Her cough has been chronic maybe slightly worsened over the past few days.  Does not appear in respiratory distress.  Lungs are clear.  I suspect MSK etiology for symptoms   Catherine Aguilar PARAS, DO 07/06/24 2047

## 2024-07-06 NOTE — ED Triage Notes (Signed)
 Patient arrives via [facility] from home for left rib cage pain with increased cough. HX of COPD. Pain with inspiration, movement, and coughing. Decreased left lower lobes. Alert and oriented. Ambulatory on scene.   EMS vitals 158/72 HR 70 98 on room air

## 2024-07-06 NOTE — ED Notes (Signed)
 Patient transported to X-ray

## 2024-07-08 ENCOUNTER — Ambulatory Visit (HOSPITAL_COMMUNITY)
Admission: RE | Admit: 2024-07-08 | Discharge: 2024-07-08 | Disposition: A | Discharge status: Home / Self Care | Source: Ambulatory Visit | Attending: Internal Medicine | Admitting: Internal Medicine

## 2024-07-08 VITALS — BP 142/67 | HR 67 | Temp 97.3°F | Resp 17

## 2024-07-08 DIAGNOSIS — N185 Chronic kidney disease, stage 5: Secondary | ICD-10-CM | POA: Diagnosis not present

## 2024-07-08 DIAGNOSIS — D631 Anemia in chronic kidney disease: Secondary | ICD-10-CM

## 2024-07-08 LAB — POCT HEMOGLOBIN-HEMACUE: Hemoglobin: 10.2 g/dL — ABNORMAL LOW (ref 12.0–15.0)

## 2024-07-08 MED ORDER — DARBEPOETIN ALFA 200 MCG/0.4ML IJ SOSY
200.0000 ug | PREFILLED_SYRINGE | Freq: Once | INTRAMUSCULAR | Status: AC
  Administered 2024-07-08: 200 ug via SUBCUTANEOUS

## 2024-07-08 MED ORDER — DARBEPOETIN ALFA 200 MCG/0.4ML IJ SOSY
PREFILLED_SYRINGE | INTRAMUSCULAR | Status: AC
  Filled 2024-07-08: qty 0.4

## 2024-07-09 ENCOUNTER — Ambulatory Visit (HOSPITAL_BASED_OUTPATIENT_CLINIC_OR_DEPARTMENT_OTHER)
Admission: RE | Admit: 2024-07-09 | Discharge: 2024-07-09 | Disposition: A | Discharge status: Home / Self Care | Source: Ambulatory Visit | Attending: Internal Medicine | Admitting: Internal Medicine

## 2024-07-09 DIAGNOSIS — J329 Chronic sinusitis, unspecified: Secondary | ICD-10-CM | POA: Insufficient documentation

## 2024-07-09 DIAGNOSIS — F1290 Cannabis use, unspecified, uncomplicated: Secondary | ICD-10-CM | POA: Insufficient documentation

## 2024-07-09 DIAGNOSIS — R062 Wheezing: Secondary | ICD-10-CM | POA: Insufficient documentation

## 2024-07-09 DIAGNOSIS — R053 Chronic cough: Secondary | ICD-10-CM | POA: Diagnosis present

## 2024-07-09 DIAGNOSIS — M3131 Wegener's granulomatosis with renal involvement: Secondary | ICD-10-CM | POA: Insufficient documentation

## 2024-07-09 DIAGNOSIS — R0602 Shortness of breath: Secondary | ICD-10-CM | POA: Diagnosis present

## 2024-07-15 ENCOUNTER — Encounter: Payer: Self-pay | Admitting: Family Medicine

## 2024-07-15 ENCOUNTER — Ambulatory Visit (INDEPENDENT_AMBULATORY_CARE_PROVIDER_SITE_OTHER): Admitting: Family Medicine

## 2024-07-15 VITALS — BP 138/72 | HR 61 | Wt 186.6 lb

## 2024-07-15 DIAGNOSIS — Z23 Encounter for immunization: Secondary | ICD-10-CM | POA: Diagnosis not present

## 2024-07-15 DIAGNOSIS — A530 Latent syphilis, unspecified as early or late: Secondary | ICD-10-CM | POA: Diagnosis not present

## 2024-07-15 DIAGNOSIS — Z794 Long term (current) use of insulin: Secondary | ICD-10-CM | POA: Diagnosis not present

## 2024-07-15 DIAGNOSIS — J4489 Other specified chronic obstructive pulmonary disease: Secondary | ICD-10-CM

## 2024-07-15 DIAGNOSIS — E119 Type 2 diabetes mellitus without complications: Secondary | ICD-10-CM

## 2024-07-15 DIAGNOSIS — M3130 Wegener's granulomatosis without renal involvement: Secondary | ICD-10-CM

## 2024-07-15 LAB — POCT GLYCOSYLATED HEMOGLOBIN (HGB A1C): HbA1c, POC (controlled diabetic range): 5.5 % (ref 0.0–7.0)

## 2024-07-15 MED ORDER — BREZTRI AEROSPHERE 160-9-4.8 MCG/ACT IN AERO: 2.0000 | INHALATION_SPRAY | Freq: Two times a day (BID) | RESPIRATORY_TRACT | Status: DC

## 2024-07-15 MED ORDER — LEVOFLOXACIN 500 MG PO TABS
500.0000 mg | ORAL_TABLET | ORAL | Status: DC
Start: 2024-07-15 — End: 2024-07-15

## 2024-07-15 MED ORDER — SPIRIVA RESPIMAT 2.5 MCG/ACT IN AERS: 2.0000 | INHALATION_SPRAY | Freq: Every day | RESPIRATORY_TRACT | 3 refills | Status: DC

## 2024-07-15 MED ORDER — SPACER/AERO-HOLDING CHAMBERS DEVI: 0 refills | Status: AC

## 2024-07-15 NOTE — Assessment & Plan Note (Signed)
 Messaged with patient's pulmonologist, Dr. Geronimo.  He advised the patient should be using Spiriva  daily and should not be taking antibiotics anymore.  Advised follow-up appointment with pulmonology NP-provided patient with pulmonology clinic contact information.  Provided patient with Breztri inhaler sample as this is the only COPD inhaler we have in our clinic today.  Sent in prescription for Spiriva .  Provided patient with spacer and instructed to use this every time she uses an inhaled medication.  Ordered additional spacers as well.  Patient states she does have follow-up with nephrologist scheduled.

## 2024-07-15 NOTE — Assessment & Plan Note (Signed)
 A1c improved to 5.5 today.  Checking urine microalbumin creatinine ratio today.

## 2024-07-15 NOTE — Progress Notes (Signed)
    SUBJECTIVE:   CHIEF COMPLAINT / HPI:   DM2 --due for A1c and MCR  Treated for syphilis in April with penicillin  G once weekly x 3 weeks.  Will recheck RPR today  Coughing spells daily --has been using Albuterol  frequently --does not have spiriva  inhaler --does not have spacer -- Levaquin  on med list, states she was taking antibiotic every other day for a while in the past -- Saw pulmonologist, Dr. Geronimo, in mid October.    PERTINENT  PMH / PSH: CVA, HTN, GPA, systemic vasculitis, asthma with COPD, type 2 diabetes, chronic kidney disease, HLD, anemia  OBJECTIVE:   BP 138/72   Pulse 61   Wt 186 lb 9.6 oz (84.6 kg)   SpO2 96%   BMI 29.23 kg/m   - General: No acute distress. Awake and conversant. - Eyes: Normal conjunctiva, anicteric. Round symmetric pupils. - ENT: Hearing grossly intact. No nasal discharge. - Neck: Neck is supple. No masses or thyromegaly. - Respiratory: Mildly increased work of breathing on room air, no signs of respiratory distress - Skin: No visible rashes or ulcers. - Psych: Alert and oriented. Cooperative, Appropriate mood and affect, Normal judgment. - MSK: Normal ambulation. - Neuro: Sensation and CN II-XII grossly normal.   ASSESSMENT/PLAN:   Assessment & Plan Granulomatosis with polyangiitis, unspecified whether renal involvement (HCC) Asthma with COPD (chronic obstructive pulmonary disease) (HCC) Messaged with patient's pulmonologist, Dr. Geronimo.  He advised the patient should be using Spiriva  daily and should not be taking antibiotics anymore.  Advised follow-up appointment with pulmonology NP-provided patient with pulmonology clinic contact information.  Provided patient with Breztri inhaler sample as this is the only COPD inhaler we have in our clinic today.  Sent in prescription for Spiriva .  Provided patient with spacer and instructed to use this every time she uses an inhaled medication.  Ordered additional spacers as  well.  Patient states she does have follow-up with nephrologist scheduled.  Type 2 diabetes mellitus without complication, with long-term current use of insulin  (HCC) A1c improved to 5.5 today.  Checking urine microalbumin creatinine ratio today. Positive RPR test Repeating RPR today.     Rea Raring, MD [facility]

## 2024-07-15 NOTE — Patient Instructions (Addendum)
 Thank you for coming in today! Here is a summary of what we discussed:  Please contact the pulmonology clinic to schedule a follow up appointment with their NP in the next week or so [phone]  Please start using the spacer every time you use an inhaler. Remember, the Spiriva  Dory for now) is the daily medicine you should use daily. The albuterol  inhaler is only to be used if you are having more trouble breathing. I ordered spiriva  for you- please let me know if you have issues getting it filled.  You should NOT be taking antibiotics anymore. I recommend following up with your nephrologist. Almarie Bonine- [phone].   Please call the clinic at [phone] if your symptoms worsen or you have any concerns.  Best, Dr Adele

## 2024-07-15 NOTE — Assessment & Plan Note (Signed)
 Repeating RPR today

## 2024-07-17 LAB — MICROALBUMIN / CREATININE URINE RATIO
Creatinine, Urine: 59.8 mg/dL
Microalb/Creat Ratio: 1581 mg/g{creat} — ABNORMAL HIGH (ref 0–29)
Microalbumin, Urine: 945.7 ug/mL

## 2024-07-20 ENCOUNTER — Ambulatory Visit: Payer: Self-pay | Admitting: Internal Medicine

## 2024-07-20 ENCOUNTER — Ambulatory Visit: Payer: Self-pay | Admitting: Family Medicine

## 2024-07-20 DIAGNOSIS — J849 Interstitial pulmonary disease, unspecified: Secondary | ICD-10-CM

## 2024-07-20 DIAGNOSIS — J329 Chronic sinusitis, unspecified: Secondary | ICD-10-CM

## 2024-07-20 NOTE — Progress Notes (Signed)
 Chronic mild sinusitis but is worse since 2018. Referring to ENT. Order done

## 2024-07-21 NOTE — Progress Notes (Signed)
  CT shows worsening scarring left lowre lobe - aDMIN pool pls give followup after spiro/dlco. Should be with APP  Thanks  Dr R xxxxxxxxxxxxxxxx  IMPRESSION: 1. Interval worsening of chronic scarring and volume loss in the left lung base with a small, chronic appearing, loculated left pleural effusion and associated pleural thickening. 2. Mild emphysema. 3. Cardiomegaly and coronary artery disease. 4. Unchanged small pericardial effusion. 5. Subacute, partially callused fractures of the lateral right seventh and eighth ribs.   Aortic Atherosclerosis (ICD10-I70.0) and Emphysema (ICD10-J43.9).     Electronically Signed   By: Marolyn JONETTA Jaksch M.D.   On: 07/09/2024 13:10

## 2024-07-22 ENCOUNTER — Encounter (HOSPITAL_COMMUNITY)
Admission: RE | Admit: 2024-07-22 | Discharge: 2024-07-22 | Disposition: A | Discharge status: Home / Self Care | Source: Ambulatory Visit | Attending: Internal Medicine | Admitting: Internal Medicine

## 2024-07-22 VITALS — BP 149/74 | HR 65 | Temp 97.9°F | Resp 17

## 2024-07-22 DIAGNOSIS — N185 Chronic kidney disease, stage 5: Secondary | ICD-10-CM | POA: Insufficient documentation

## 2024-07-22 DIAGNOSIS — D631 Anemia in chronic kidney disease: Secondary | ICD-10-CM | POA: Diagnosis present

## 2024-07-22 LAB — POCT HEMOGLOBIN-HEMACUE: Hemoglobin: 10.5 g/dL — ABNORMAL LOW (ref 12.0–15.0)

## 2024-07-22 LAB — IRON AND TIBC
Iron: 61 ug/dL (ref 28–170)
Saturation Ratios: 22 % (ref 10.4–31.8)
TIBC: 277 ug/dL (ref 250–450)
UIBC: 216 ug/dL

## 2024-07-22 LAB — FERRITIN: Ferritin: 339 ng/mL — ABNORMAL HIGH (ref 11–307)

## 2024-07-22 MED ORDER — DARBEPOETIN ALFA 200 MCG/0.4ML IJ SOSY
200.0000 ug | PREFILLED_SYRINGE | Freq: Once | INTRAMUSCULAR | Status: AC
  Administered 2024-07-22: 200 ug via SUBCUTANEOUS

## 2024-07-22 MED ORDER — DARBEPOETIN ALFA 200 MCG/0.4ML IJ SOSY
PREFILLED_SYRINGE | INTRAMUSCULAR | Status: AC
  Filled 2024-07-22: qty 0.4

## 2024-07-24 ENCOUNTER — Ambulatory Visit (INDEPENDENT_AMBULATORY_CARE_PROVIDER_SITE_OTHER)

## 2024-07-24 DIAGNOSIS — J329 Chronic sinusitis, unspecified: Secondary | ICD-10-CM | POA: Diagnosis not present

## 2024-07-24 DIAGNOSIS — J849 Interstitial pulmonary disease, unspecified: Secondary | ICD-10-CM

## 2024-07-24 LAB — PULMONARY FUNCTION TEST
DL/VA % pred: 59 %
DL/VA: 2.41 ml/min/mmHg/L
DLCO cor % pred: 39 %
DLCO cor: 8.2 ml/min/mmHg
DLCO unc % pred: 35 %
DLCO unc: 7.36 ml/min/mmHg
FEF 25-75 Pre: 0.62 L/s
FEF2575-%Pred-Pre: 30 %
FEV1-%Pred-Pre: 45 %
FEV1-Pre: 1.15 L
FEV1FVC-%Pred-Pre: 71 %
FEV6-%Pred-Pre: 66 %
FEV6-Pre: 2.08 L
FEV6FVC-%Pred-Pre: 103 %
FVC-%Pred-Pre: 64 %
FVC-Pre: 2.1 L
Pre FEV1/FVC ratio: 54 %
Pre FEV6/FVC Ratio: 99 %

## 2024-07-24 NOTE — Patient Instructions (Signed)
Spiro/DLCO performed today. 

## 2024-07-24 NOTE — Progress Notes (Signed)
Spiro/DLCO performed today. 

## 2024-07-27 ENCOUNTER — Ambulatory Visit: Admitting: Primary Care

## 2024-07-27 DIAGNOSIS — J4489 Other specified chronic obstructive pulmonary disease: Secondary | ICD-10-CM

## 2024-07-28 ENCOUNTER — Ambulatory Visit: Admitting: Primary Care

## 2024-08-05 ENCOUNTER — Encounter (INDEPENDENT_AMBULATORY_CARE_PROVIDER_SITE_OTHER): Payer: Self-pay

## 2024-08-05 ENCOUNTER — Encounter (HOSPITAL_COMMUNITY)
Admission: RE | Admit: 2024-08-05 | Discharge: 2024-08-05 | Disposition: A | Discharge status: Home / Self Care | Source: Ambulatory Visit | Attending: Internal Medicine | Admitting: Internal Medicine

## 2024-08-05 VITALS — BP 138/69 | HR 65 | Temp 97.4°F | Resp 18

## 2024-08-05 DIAGNOSIS — N185 Chronic kidney disease, stage 5: Secondary | ICD-10-CM | POA: Diagnosis not present

## 2024-08-05 DIAGNOSIS — D631 Anemia in chronic kidney disease: Secondary | ICD-10-CM

## 2024-08-05 LAB — POCT HEMOGLOBIN-HEMACUE: Hemoglobin: 9.9 g/dL — ABNORMAL LOW (ref 12.0–15.0)

## 2024-08-05 MED ORDER — DARBEPOETIN ALFA 200 MCG/0.4ML IJ SOSY
PREFILLED_SYRINGE | INTRAMUSCULAR | Status: AC
  Filled 2024-08-05: qty 0.4

## 2024-08-05 MED ORDER — DARBEPOETIN ALFA 200 MCG/0.4ML IJ SOSY
200.0000 ug | PREFILLED_SYRINGE | Freq: Once | INTRAMUSCULAR | Status: AC
  Administered 2024-08-05: 200 ug via SUBCUTANEOUS

## 2024-08-11 ENCOUNTER — Other Ambulatory Visit: Payer: Self-pay | Admitting: Family Medicine

## 2024-08-14 ENCOUNTER — Telehealth: Admitting: Primary Care

## 2024-08-14 ENCOUNTER — Ambulatory Visit: Admitting: Primary Care

## 2024-08-14 DIAGNOSIS — F1290 Cannabis use, unspecified, uncomplicated: Secondary | ICD-10-CM

## 2024-08-14 DIAGNOSIS — J329 Chronic sinusitis, unspecified: Secondary | ICD-10-CM | POA: Diagnosis not present

## 2024-08-14 DIAGNOSIS — M3131 Wegener's granulomatosis with renal involvement: Secondary | ICD-10-CM

## 2024-08-14 DIAGNOSIS — J90 Pleural effusion, not elsewhere classified: Secondary | ICD-10-CM

## 2024-08-14 DIAGNOSIS — J449 Chronic obstructive pulmonary disease, unspecified: Secondary | ICD-10-CM | POA: Diagnosis not present

## 2024-08-14 DIAGNOSIS — J439 Emphysema, unspecified: Secondary | ICD-10-CM

## 2024-08-14 DIAGNOSIS — J8410 Pulmonary fibrosis, unspecified: Secondary | ICD-10-CM

## 2024-08-14 DIAGNOSIS — Z87891 Personal history of nicotine dependence: Secondary | ICD-10-CM

## 2024-08-14 MED ORDER — BREZTRI AEROSPHERE 160-9-4.8 MCG/ACT IN AERO: 2.0000 | INHALATION_SPRAY | Freq: Two times a day (BID) | RESPIRATORY_TRACT | 3 refills | Status: AC

## 2024-08-14 MED ORDER — BENZONATATE 200 MG PO CAPS: 200.0000 mg | ORAL_CAPSULE | Freq: Three times a day (TID) | ORAL | 1 refills | Status: DC | PRN

## 2024-08-14 NOTE — Progress Notes (Signed)
 Virtual Visit via Video Note  I connected with Catherine Aguilar on 08/14/24 at  2:00 PM EST by a video enabled telemedicine application and verified that I am speaking with the correct person using two identifiers.  Location: Patient: Home Provider: Office    I discussed the limitations of evaluation and management by telemedicine and the availability of in person appointments. The patient expressed understanding and agreed to proceed.  History of Present Illness:  OV 06/26/2024  Subjective:  Patient ID: Catherine Aguilar, female , DOB: 1955/12/03 , age 36 y.o. , MRN: [id number] , ADDRESS: [address]  [place] [id number] PCP Adele Song, MD Patient Care Team: Adele Song, MD as PCP - General (Family Medicine)  This Provider for this visit: Treatment Team:  Attending Provider: [name], MD   Follow-up emphysema: On Spiriva  Follow-up smoking: Quit summer 2020 Followup MJ smoking: admitted this to Sept 2021 and may 2022 (+ve urine march2018) Follow-up immunosuppressed status due to vasculitis: On Bactrim  prophylaxis Follow-up anemia: On Aranesp  monitored by renal. Follow-up vasculitis  - systemic vasculitis syndrome with life-threatening hemoptysis and respiratory and renal failure in early 2018.s/p induction Rituxan  ending  May 2018.  Tthen on daily prednisone  and Bactrim  prophylaxis.  Last B-cell flow cytometry January 2019 showing suppression of B cells but in summer 2019 emergence with 3% B cells associated with rising MPO tites in renal clinic summer 2019.   -  Rx for relapose (based on b cells and creat rise) 1000mg  IV rituxan  - May 08, 2018.,  80 mg IV x1 October 21, 2019 -> repeat March 2022  -Off prednisone  because she could not get a refill [Prednison not in [facility]  Sept 2019, Jan 2020 and Aug 2020 and June 2021,  sept 2021, may 2022), late 2023 by DR Gearline per her history   -  On bactrim  for prophylaxis  - > stopped 07/26/23 after d/w Dr Gearline Bending  gold - negtive Sept 2021  06/26/2024 -   Chief Complaint  Patient presents with   Follow-up    Pt states since LOVE breathing is the same nothing has changed SOB w/ exertion Prod cough ( phlegm clear) also Dry cough Sinus infection still around      HPI Catherine Aguilar 68 y.o. -returns for follow-up.  Personally not seen in 11 months.  Aware to call and make this appointment.  She says overall she is stable.  She has chronic anemia and she is getting heme infusions.  Is being managed by renal.  She states the CKD is stable and Dr. Gearline is following.  She continues to smoke marijuana.  Her major complaint is that still with sinus drainage that significant and uncontrollable cough.  She says been going on for a long time.  I indicated to her that this was news to me.  She said I lied in the past about my cough.  And is actually quite bad.  She says that she can almost fall of the floor.  Occasionally she has been found on the ground.  She is coughing every other day or every few days and episodes.  It gets a little bit better with water.  She says she has never had syncope but has come close.  There is also associated wheezing and shortness of breath.  She calls it annoying.  She is on Anoro She is on Flonase  She is not taking Astelin   She is off Bactrim  She has been off Rituxan  for 2  years.   Review of external medical records indicate it has been over 5 years since she has had CT scan sinus or CT scan chest.   Lab review most send hemoglobin 10 g% October 2025.    Chronic cough Wheezing Shortness of breath Marijuana smoker Wegener's granulomatosis with renal involvement (HCC) Chronic sinusitis, unspecified location  - You are describing severe chronic cough.  This could be because of sinus drainage or potential allergy issues or even related to marijuana smoking.  It has been over 6 years since he had CT scans of the chest or the sinuses  Plan - CT scan sinus -  High-resolution CT scan chest - Full pulmonary function test - Do blood work for CBC with differential and allergy panel, ESR today - Aim to stop smoking marijuana - Continue Flonase  - Start Astelin nasal spray 2 squirts into each nostril twice daily  VAsculitis Immunosuppressed status Sp Rituxan  repeat March 2022  versus sometime in 2023 (per your history)   - Possible vasculitis might be acting up because of your sinus complaints  Plan  - We will see what the results are -Rest per renal  Emphysema   - stable  plan - continue spiriva  respimat daily -albuterolas needed  CKD -   Per Dr Gearline 07/26/2023 -    Marijuana use  - this is hurting your lung in long run thought seems to help sensation in leg  plan  - please quit aSAP    08/14/2024- Virtual visit/ ILD  Discussed the use of AI scribe software for clinical note transcription with the patient, who gave verbal consent to proceed.  History of Present Illness Catherine Aguilar is a 68 year old female with vasculitis, Wegener's granulomatosis with renal involvement, chronic cough, and chronic sinusitis who presents with persistent cough and sinus symptoms.   She has a persistent cough and sinus symptoms, with a history of vasculitis, specifically Wegener's granulomatosis with renal involvement, and chronic sinusitis. HRCT showed interval worsening of chronic scarring left lung base with chronic left pleural effusion, mild emphysema, cardiomegaly, small unchanged pericardial effusion. CT sinuses showed chronic mild sinusitis worse since 2018, referred to ENT. Sed rate high but improved. Allergy testing normal. Anemia unchanged .  She uses a new nasal spray, Astelin, in addition to Flonase , but notes no improvement in her rhinitis or sinus symptoms. No hemoptysis or epistaxis. Her cough is described as 'uncontrolled' and has not improved with previous treatments. She has not tried Tesla pearls for the cough.  Her past  medical history includes a Rituxan  infusion in 2022 and a year-long course of Bactrim , which she has since stopped. She denies current use of oxygen and reports no issues with breathing, though she does experience wheezing. She is currently using Spiriva  and a rescue inhaler, albuterol , for her symptoms.  Social history reveals she is an ex-smoker but continues to smoke marijuana.    Observations/Objective:   PFT FVC 2.10 (64%), FEV1 1.15 (45%), ratio 54, DLCO 8.20 (39%) Severe obstructive airway disease with severe diffusion defect   Assessment and Plan:  Assessment and Plan Assessment & Plan Chronic obstructive pulmonary disease (COPD)  Severe obstructive airway disease with reduced diffusion capacity. Chronic cough and wheezing present. No current dyspnea. Smoking cessation is critical to manage symptoms. - Prescribed Breztri  Aerosphere two puffs twice daily in place of Anoro - Continue albuterol  as a rescue inhaler every four to six hours for breakthrough dyspnea. - Sent prescription for cough medication. - Will schedule follow-up  in six weeks to assess response to new inhaler and perform a walk test to evaluate oxygen levels.  Pulmonary fibrosis with chronic left pleural effusion Chronic left pleural effusion noted on CT scan. No acute changes or symptoms suggestive of active vasculitis. - Continue to monitor for any signs of hemoptysis or sinus bleeding.  Chronic sinusitis Mild sinusitis noted on CT scan, worse compared to 2018. Symptoms include cough and sinus drainage. Current treatment with Flonase  and Astelin nasal sprays has not improved symptoms. - Continue Flonase  and Astelin nasal sprays. - Referred to ear, nose, and throat specialist for further evaluation.  Granulomatosis with polyangiitis (Wegener's) with renal involvement, in remission No current signs of active vasculitis. No hemoptysis or sinus bleeding reported. - Continue follow-up with  nephrologist.  Emphysema Noted on CT scan. Smoking cessation is crucial to prevent further progression. - Emphasized the importance of smoking cessation.  Current marijuana use Continued marijuana use contributing to respiratory symptoms. Smoking cessation is imperative to improve respiratory health. - Advised immediate cessation of marijuana smoking.   Follow Up Instructions:  6 weeks with walk test    I discussed the assessment and treatment plan with the patient. The patient was provided an opportunity to ask questions and all were answered. The patient agreed with the plan and demonstrated an understanding of the instructions.   The patient was advised to call back or seek an in-person evaluation if the symptoms worsen or if the condition fails to improve as anticipated.  I provided 30 minutes of non-face-to-face time during this encounter.   Catherine LELON Ferrari, NP

## 2024-08-17 ENCOUNTER — Other Ambulatory Visit: Payer: Self-pay

## 2024-08-17 ENCOUNTER — Encounter (HOSPITAL_COMMUNITY): Payer: Self-pay | Admitting: Internal Medicine

## 2024-08-17 ENCOUNTER — Inpatient Hospital Stay (HOSPITAL_COMMUNITY)
Admission: EM | Admit: 2024-08-17 | Discharge: 2024-08-19 | DRG: 193 | Disposition: A | Attending: Family Medicine | Admitting: Family Medicine

## 2024-08-17 ENCOUNTER — Emergency Department (HOSPITAL_COMMUNITY)

## 2024-08-17 DIAGNOSIS — R6889 Other general symptoms and signs: Secondary | ICD-10-CM | POA: Diagnosis not present

## 2024-08-17 DIAGNOSIS — Z743 Need for continuous supervision: Secondary | ICD-10-CM | POA: Diagnosis not present

## 2024-08-17 DIAGNOSIS — Z862 Personal history of diseases of the blood and blood-forming organs and certain disorders involving the immune mechanism: Secondary | ICD-10-CM

## 2024-08-17 DIAGNOSIS — J189 Pneumonia, unspecified organism: Secondary | ICD-10-CM | POA: Diagnosis present

## 2024-08-17 DIAGNOSIS — E119 Type 2 diabetes mellitus without complications: Secondary | ICD-10-CM

## 2024-08-17 DIAGNOSIS — N189 Chronic kidney disease, unspecified: Secondary | ICD-10-CM | POA: Diagnosis present

## 2024-08-17 DIAGNOSIS — D631 Anemia in chronic kidney disease: Secondary | ICD-10-CM

## 2024-08-17 DIAGNOSIS — I10 Essential (primary) hypertension: Secondary | ICD-10-CM | POA: Diagnosis present

## 2024-08-17 DIAGNOSIS — Z833 Family history of diabetes mellitus: Secondary | ICD-10-CM | POA: Diagnosis not present

## 2024-08-17 DIAGNOSIS — Z8673 Personal history of transient ischemic attack (TIA), and cerebral infarction without residual deficits: Secondary | ICD-10-CM | POA: Diagnosis not present

## 2024-08-17 DIAGNOSIS — N179 Acute kidney failure, unspecified: Secondary | ICD-10-CM | POA: Diagnosis present

## 2024-08-17 DIAGNOSIS — E782 Mixed hyperlipidemia: Secondary | ICD-10-CM

## 2024-08-17 DIAGNOSIS — J441 Chronic obstructive pulmonary disease with (acute) exacerbation: Secondary | ICD-10-CM | POA: Diagnosis present

## 2024-08-17 DIAGNOSIS — Z823 Family history of stroke: Secondary | ICD-10-CM | POA: Diagnosis not present

## 2024-08-17 DIAGNOSIS — R0602 Shortness of breath: Secondary | ICD-10-CM | POA: Diagnosis not present

## 2024-08-17 DIAGNOSIS — Z8619 Personal history of other infectious and parasitic diseases: Secondary | ICD-10-CM | POA: Diagnosis not present

## 2024-08-17 DIAGNOSIS — Z90710 Acquired absence of both cervix and uterus: Secondary | ICD-10-CM | POA: Diagnosis not present

## 2024-08-17 DIAGNOSIS — Z794 Long term (current) use of insulin: Secondary | ICD-10-CM

## 2024-08-17 DIAGNOSIS — N185 Chronic kidney disease, stage 5: Secondary | ICD-10-CM | POA: Diagnosis present

## 2024-08-17 DIAGNOSIS — E785 Hyperlipidemia, unspecified: Secondary | ICD-10-CM | POA: Diagnosis present

## 2024-08-17 DIAGNOSIS — Z860101 Personal history of adenomatous and serrated colon polyps: Secondary | ICD-10-CM | POA: Diagnosis not present

## 2024-08-17 LAB — RESP PANEL BY RT-PCR (RSV, FLU A&B, COVID)  RVPGX2
Influenza A by PCR: NEGATIVE
Influenza B by PCR: NEGATIVE
Resp Syncytial Virus by PCR: NEGATIVE
SARS Coronavirus 2 by RT PCR: NEGATIVE

## 2024-08-17 LAB — PROTIME-INR
INR: 1.1 (ref 0.8–1.2)
Prothrombin Time: 14.4 s (ref 11.4–15.2)

## 2024-08-17 LAB — CBC WITH DIFFERENTIAL/PLATELET
Abs Immature Granulocytes: 0.04 K/uL (ref 0.00–0.07)
Abs Immature Granulocytes: 0.05 K/uL (ref 0.00–0.07)
Basophils Absolute: 0 K/uL (ref 0.0–0.1)
Basophils Absolute: 0 K/uL (ref 0.0–0.1)
Basophils Relative: 0 %
Basophils Relative: 0 %
Eosinophils Absolute: 0 K/uL (ref 0.0–0.5)
Eosinophils Absolute: 0.2 K/uL (ref 0.0–0.5)
Eosinophils Relative: 0 %
Eosinophils Relative: 3 %
HCT: 28.4 % — ABNORMAL LOW (ref 36.0–46.0)
HCT: 28.4 % — ABNORMAL LOW (ref 36.0–46.0)
Hemoglobin: 8.7 g/dL — ABNORMAL LOW (ref 12.0–15.0)
Hemoglobin: 8.8 g/dL — ABNORMAL LOW (ref 12.0–15.0)
Immature Granulocytes: 0 %
Immature Granulocytes: 1 %
Lymphocytes Relative: 13 %
Lymphocytes Relative: 3 %
Lymphs Abs: 0.3 K/uL — ABNORMAL LOW (ref 0.7–4.0)
Lymphs Abs: 1.2 K/uL (ref 0.7–4.0)
MCH: 25.3 pg — ABNORMAL LOW (ref 26.0–34.0)
MCH: 25.4 pg — ABNORMAL LOW (ref 26.0–34.0)
MCHC: 30.6 g/dL (ref 30.0–36.0)
MCHC: 31 g/dL (ref 30.0–36.0)
MCV: 81.6 fL (ref 80.0–100.0)
MCV: 82.8 fL (ref 80.0–100.0)
Monocytes Absolute: 0.1 K/uL (ref 0.1–1.0)
Monocytes Absolute: 0.7 K/uL (ref 0.1–1.0)
Monocytes Relative: 1 %
Monocytes Relative: 8 %
Neutro Abs: 6.8 K/uL (ref 1.7–7.7)
Neutro Abs: 8.7 K/uL — ABNORMAL HIGH (ref 1.7–7.7)
Neutrophils Relative %: 76 %
Neutrophils Relative %: 95 %
Platelets: 193 K/uL (ref 150–400)
Platelets: 194 K/uL (ref 150–400)
RBC: 3.43 MIL/uL — ABNORMAL LOW (ref 3.87–5.11)
RBC: 3.48 MIL/uL — ABNORMAL LOW (ref 3.87–5.11)
RDW: 18.6 % — ABNORMAL HIGH (ref 11.5–15.5)
RDW: 18.7 % — ABNORMAL HIGH (ref 11.5–15.5)
WBC: 8.9 K/uL (ref 4.0–10.5)
WBC: 9.1 K/uL (ref 4.0–10.5)
nRBC: 0 % (ref 0.0–0.2)
nRBC: 0 % (ref 0.0–0.2)

## 2024-08-17 LAB — BASIC METABOLIC PANEL WITH GFR
Anion gap: 20 — ABNORMAL HIGH (ref 5–15)
BUN: 119 mg/dL — ABNORMAL HIGH (ref 8–23)
CO2: 19 mmol/L — ABNORMAL LOW (ref 22–32)
Calcium: 7.4 mg/dL — ABNORMAL LOW (ref 8.9–10.3)
Chloride: 100 mmol/L (ref 98–111)
Creatinine, Ser: 12.9 mg/dL — ABNORMAL HIGH (ref 0.44–1.00)
GFR, Estimated: 3 mL/min — ABNORMAL LOW (ref >60)
Glucose, Bld: 95 mg/dL (ref 70–99)
Potassium: 4.6 mmol/L (ref 3.5–5.1)
Sodium: 139 mmol/L (ref 135–145)

## 2024-08-17 LAB — COMPREHENSIVE METABOLIC PANEL WITH GFR
ALT: 26 U/L (ref 0–44)
AST: 27 U/L (ref 15–41)
Albumin: 3.8 g/dL (ref 3.5–5.0)
Alkaline Phosphatase: 90 U/L (ref 38–126)
Anion gap: 20 — ABNORMAL HIGH (ref 5–15)
BUN: 121 mg/dL — ABNORMAL HIGH (ref 8–23)
CO2: 17 mmol/L — ABNORMAL LOW (ref 22–32)
Calcium: 7.6 mg/dL — ABNORMAL LOW (ref 8.9–10.3)
Chloride: 98 mmol/L (ref 98–111)
Creatinine, Ser: 12.6 mg/dL — ABNORMAL HIGH (ref 0.44–1.00)
GFR, Estimated: 3 mL/min — ABNORMAL LOW (ref >60)
Glucose, Bld: 250 mg/dL — ABNORMAL HIGH (ref 70–99)
Potassium: 5.3 mmol/L — ABNORMAL HIGH (ref 3.5–5.1)
Sodium: 135 mmol/L (ref 135–145)
Total Bilirubin: 0.3 mg/dL (ref 0.0–1.2)
Total Protein: 6.9 g/dL (ref 6.5–8.1)

## 2024-08-17 LAB — URINALYSIS, COMPLETE (UACMP) WITH MICROSCOPIC
Bilirubin Urine: NEGATIVE
Glucose, UA: 50 mg/dL — AB
Ketones, ur: NEGATIVE mg/dL
Nitrite: NEGATIVE
Protein, ur: >=300 mg/dL — AB
Specific Gravity, Urine: 1.011 (ref 1.005–1.030)
pH: 5 (ref 5.0–8.0)

## 2024-08-17 LAB — PROCALCITONIN: Procalcitonin: 0.49 ng/mL

## 2024-08-17 LAB — CREATININE, URINE, RANDOM: Creatinine, Urine: 86 mg/dL

## 2024-08-17 LAB — PROTEIN / CREATININE RATIO, URINE
Creatinine, Urine: 85 mg/dL
Protein Creatinine Ratio: 2.38 mg/mg{creat} — ABNORMAL HIGH (ref 0.00–0.15)
Total Protein, Urine: 202 mg/dL

## 2024-08-17 LAB — GLUCOSE, CAPILLARY: Glucose-Capillary: 186 mg/dL — ABNORMAL HIGH (ref 70–99)

## 2024-08-17 LAB — CBG MONITORING, ED
Glucose-Capillary: 244 mg/dL — ABNORMAL HIGH (ref 70–99)
Glucose-Capillary: 211 mg/dL — ABNORMAL HIGH (ref 70–99)
Glucose-Capillary: 147 mg/dL — ABNORMAL HIGH (ref 70–99)

## 2024-08-17 LAB — APTT: aPTT: 34 s (ref 24–36)

## 2024-08-17 LAB — PHOSPHORUS: Phosphorus: 9 mg/dL — ABNORMAL HIGH (ref 2.5–4.6)

## 2024-08-17 LAB — MAGNESIUM: Magnesium: 2 mg/dL (ref 1.7–2.4)

## 2024-08-17 LAB — PRO BRAIN NATRIURETIC PEPTIDE
Pro Brain Natriuretic Peptide: 2680 pg/mL — ABNORMAL HIGH (ref <300.0)
Pro Brain Natriuretic Peptide: 2404 pg/mL — ABNORMAL HIGH (ref <300.0)

## 2024-08-17 LAB — SODIUM, URINE, RANDOM: Sodium, Ur: 60 mmol/L

## 2024-08-17 MED ORDER — DOXAZOSIN MESYLATE 2 MG PO TABS
2.0000 mg | ORAL_TABLET | Freq: Every day | ORAL | Status: DC
  Administered 2024-08-17 – 2024-08-18 (×2): 2 mg via ORAL
  Filled 2024-08-17 (×4): qty 1

## 2024-08-17 MED ORDER — IPRATROPIUM-ALBUTEROL 0.5-2.5 (3) MG/3ML IN SOLN
3.0000 mL | Freq: Four times a day (QID) | RESPIRATORY_TRACT | Status: DC
  Administered 2024-08-17 – 2024-08-18 (×5): 3 mL via RESPIRATORY_TRACT
  Filled 2024-08-17 (×5): qty 3

## 2024-08-17 MED ORDER — BENZONATATE 100 MG PO CAPS
200.0000 mg | ORAL_CAPSULE | Freq: Three times a day (TID) | ORAL | Status: DC | PRN
  Administered 2024-08-18: 200 mg via ORAL
  Filled 2024-08-17: qty 2

## 2024-08-17 MED ORDER — PRAVASTATIN SODIUM 40 MG PO TABS
40.0000 mg | ORAL_TABLET | Freq: Every day | ORAL | Status: DC
  Administered 2024-08-17 – 2024-08-18 (×2): 40 mg via ORAL
  Filled 2024-08-17 (×2): qty 1

## 2024-08-17 MED ORDER — ONDANSETRON HCL 4 MG/2ML IJ SOLN: 4.0000 mg | Freq: Four times a day (QID) | INTRAMUSCULAR | Status: DC | PRN

## 2024-08-17 MED ORDER — ACETAMINOPHEN 325 MG PO TABS: 650.0000 mg | ORAL_TABLET | Freq: Four times a day (QID) | ORAL | Status: DC | PRN

## 2024-08-17 MED ORDER — ALBUTEROL SULFATE (2.5 MG/3ML) 0.083% IN NEBU
2.5000 mg | INHALATION_SOLUTION | RESPIRATORY_TRACT | Status: DC | PRN
  Administered 2024-08-18: 2.5 mg via RESPIRATORY_TRACT
  Filled 2024-08-17: qty 3

## 2024-08-17 MED ORDER — MELATONIN 3 MG PO TABS: 3.0000 mg | ORAL_TABLET | Freq: Every evening | ORAL | Status: DC | PRN

## 2024-08-17 MED ORDER — AMLODIPINE BESYLATE 10 MG PO TABS
10.0000 mg | ORAL_TABLET | Freq: Every day | ORAL | Status: DC
  Administered 2024-08-17 – 2024-08-19 (×3): 10 mg via ORAL
  Filled 2024-08-17: qty 1
  Filled 2024-08-17: qty 2
  Filled 2024-08-17: qty 1

## 2024-08-17 MED ORDER — SODIUM CHLORIDE 0.9 % IV SOLN
1.0000 g | INTRAVENOUS | Status: DC
  Administered 2024-08-17 – 2024-08-18 (×2): 1 g via INTRAVENOUS
  Filled 2024-08-17 (×2): qty 10

## 2024-08-17 MED ORDER — ACETAMINOPHEN 650 MG RE SUPP: 650.0000 mg | Freq: Four times a day (QID) | RECTAL | Status: DC | PRN

## 2024-08-17 MED ORDER — SODIUM CHLORIDE 0.9 % IV SOLN
500.0000 mg | Freq: Once | INTRAVENOUS | Status: AC
  Administered 2024-08-17: 500 mg via INTRAVENOUS
  Filled 2024-08-17: qty 5

## 2024-08-17 MED ORDER — INSULIN GLARGINE 100 UNIT/ML ~~LOC~~ SOLN
5.0000 [IU] | Freq: Every day | SUBCUTANEOUS | Status: DC
  Administered 2024-08-17 – 2024-08-19 (×3): 5 [IU] via SUBCUTANEOUS
  Filled 2024-08-17 (×3): qty 0.05

## 2024-08-17 MED ORDER — IPRATROPIUM-ALBUTEROL 0.5-2.5 (3) MG/3ML IN SOLN
3.0000 mL | Freq: Once | RESPIRATORY_TRACT | Status: AC
  Administered 2024-08-17: 3 mL via RESPIRATORY_TRACT
  Filled 2024-08-17: qty 3

## 2024-08-17 MED ORDER — METHYLPREDNISOLONE SODIUM SUCC 125 MG IJ SOLR
125.0000 mg | Freq: Once | INTRAMUSCULAR | Status: AC
  Administered 2024-08-17: 125 mg via INTRAVENOUS
  Filled 2024-08-17: qty 2

## 2024-08-17 MED ORDER — SODIUM CHLORIDE 0.9 % IV SOLN
2.0000 g | Freq: Once | INTRAVENOUS | Status: AC
  Administered 2024-08-17: 2 g via INTRAVENOUS
  Filled 2024-08-17: qty 20

## 2024-08-17 MED ORDER — METHYLPREDNISOLONE SODIUM SUCC 125 MG IJ SOLR
80.0000 mg | Freq: Two times a day (BID) | INTRAMUSCULAR | Status: DC
  Administered 2024-08-17 – 2024-08-18 (×3): 80 mg via INTRAVENOUS
  Filled 2024-08-17 (×3): qty 2

## 2024-08-17 MED ORDER — INSULIN ASPART 100 UNIT/ML IJ SOLN
0.0000 [IU] | Freq: Three times a day (TID) | INTRAMUSCULAR | Status: DC
  Administered 2024-08-17 (×2): 2 [IU] via SUBCUTANEOUS
  Administered 2024-08-18 (×3): 1 [IU] via SUBCUTANEOUS
  Filled 2024-08-17 (×2): qty 1
  Filled 2024-08-17: qty 2
  Filled 2024-08-17: qty 1
  Filled 2024-08-17: qty 2

## 2024-08-17 MED ORDER — FUROSEMIDE 10 MG/ML IJ SOLN
40.0000 mg | Freq: Once | INTRAMUSCULAR | Status: AC
  Administered 2024-08-17: 40 mg via INTRAVENOUS
  Filled 2024-08-17: qty 4

## 2024-08-17 MED ORDER — SODIUM CHLORIDE 0.9 % IV SOLN
500.0000 mg | INTRAVENOUS | Status: DC
  Administered 2024-08-18: 500 mg via INTRAVENOUS
  Filled 2024-08-17 (×2): qty 5

## 2024-08-17 MED ORDER — ASPIRIN 81 MG PO TBEC
81.0000 mg | DELAYED_RELEASE_TABLET | Freq: Every day | ORAL | Status: DC
  Administered 2024-08-17 – 2024-08-19 (×3): 81 mg via ORAL
  Filled 2024-08-17 (×3): qty 1

## 2024-08-17 NOTE — ED Triage Notes (Signed)
 Patient brought in from [facility]. Productive cough with green crud for a couple of weeks and worsening SOB. Medic reported wheezing in lower lungs. Duo neb and solumedrol and 20G left forearm. VS with medic  BP160/110 P70 100% on neb treatment. Hx COPD and asthma.

## 2024-08-17 NOTE — ED Notes (Addendum)
 Nurse to room to obtain labs on patient. Nurse noticed a large bulging area on patient back left hand below the IV in place which was place by medic. No drainage noted. Patient asked about what happened to her hand and she responded they did that referring to medic. Patient stated it kinda hurts

## 2024-08-17 NOTE — Hospital Course (Addendum)
 68 year old woman PMH including stage V CKD not on hemodialysis, Wegener's granulomatosis involving kidneys but not lungs, COPD diabetes, anemia who presented with shortness of breath, nonproductive cough.  Admitted for CAP complicated by acute COPD exacerbation, AKI superimposed on CKD stage V.  Consultants   Procedures/Events

## 2024-08-17 NOTE — Progress Notes (Addendum)
  Progress Note   Patient: Catherine Aguilar FMW:[id number] DOB: 27-Nov-1955 DOA: 08/17/2024     0 DOS: the patient was seen and examined on 08/17/2024   Brief hospital course: 68 year old woman PMH including stage V CKD not on hemodialysis, Wegener's granulomatosis involving kidneys but not lungs, COPD diabetes, anemia who presented with shortness of breath, nonproductive cough.  Admitted for CAP complicated by acute COPD exacerbation, AKI superimposed on CKD stage V.  Consultants   Procedures/Events   Assessment and Plan: Community-acquired pneumonia Acute hypoxic respiratory failure No evidence of sepsis.  Continue empiric antibiotics, supplemental oxygen, wean as tolerated.  Acute COPD exacerbation Significant wheezing noted.  Bronchodilators, steroids.  AKI CKD stage V not on hemodialysis Admission creatinine 12.9, creatinine slightly better today at 12.6; was 12.18 in October, 9.71 in September Daily BMP, given improvement hold off on nephrology consultation for now. Potassium slightly elevated secondary to hemolysis  Anemia of CKD with baseline around 9-10 Stable.  Diabetes mellitus type 2 CBG stable.  Continue sliding scale insulin .  No reason to hold long-acting insulin . Restart Lantus .     Subjective:  Feels ok today  Physical Exam: Vitals:   08/17/24 0002 08/17/24 0730  BP: (!) 160/79 (!) 182/90  Pulse: 73 63  Resp: 18 18  Temp: 98.3 F (36.8 C) (!) 97.3 F (36.3 C)  TempSrc: Oral Oral  SpO2: 95% 96%   Physical Exam Vitals reviewed.  Constitutional:      General: She is not in acute distress.    Appearance: She is not ill-appearing or toxic-appearing.  Cardiovascular:     Rate and Rhythm: Normal rate and regular rhythm.     Heart sounds: No murmur heard. Pulmonary:     Effort: No respiratory distress.     Breath sounds: Wheezing present. No rhonchi or rales.     Comments: Mild increased respiratory effort Neurological:     Mental Status: She is  alert.  Psychiatric:        Mood and Affect: Mood normal.        Behavior: Behavior normal.     Data Reviewed: Glucose 250 this a.m. Potassium 5.3 but hemolyzed Creatinine slightly better at 12.6; was 12.18 in October, 9.71 in September BNP 2404, secondary to renal disease Procalcitonin 0.49 suggesting antibiotic use in this context Hemoglobin 8.7, slightly below baseline.  Family Communication: none   Disposition: Status is: Inpatient Remains inpatient appropriate because: pneumonia, hypoxia  Planned Discharge Destination: Home    Time spent: 35 minutes  Author: Toribio Door, MD 08/17/2024 10:13 AM  For on call review [email or web address].

## 2024-08-17 NOTE — H&P (Signed)
 History and Physical      Catherine Aguilar FMW:[id number] DOB: 1955/11/17 DOA: 08/17/2024; DOS: 08/17/2024  PCP: Adele Song, MD  Patient coming from: home   I have personally briefly reviewed patient's old medical records in [facility]  Chief Complaint: Shortness of breath  HPI: Catherine Aguilar is a 68 y.o. female with medical history significant for stage V chronic kidney disease with baseline creatinine 10-12, not on hemodialysis, Wegener's granulomatosis, COPD, central pretension, type 2 diabetes mellitus, anemia of chronic kidney disease with baseline hemoglobin 9-11, who is admitted to [facility] on 08/17/2024 with community-acquired pneumonia after presenting from home to [facility] ED complaining of shortness of breath.   The patient reports to 3 days of worsening shortness of breath as well as increase in nonproductive cough relative to her baseline cough.  Denies any associated mopped assist.  She notes wheezing, but states that this is consistent with her baseline, noting no recent worsening in the degree or distribution of her wheezing.  She notes associated subjective fever, in the absence of chills, fully rigors, or generalized myalgias.  Denies any worsening of peripheral edema.  She also denies any recent chest pain, palpitations, or diaphoresis.  She has a history of stage V chronic kidney disease with baseline creatinine 10-12, not on hemodialysis.  This is in the setting of a history of Wegener's granulomatosis.  She also has a history of COPD in the setting of being a former smoker.  No known baseline supplemental oxygen requirements.  No known history of CHF, with most recent echocardiogram performed on 12/11/2023 showing evidence of LVEF 60 to 65%, and indeterminate diastolic parameters, normal right ventricular systolic function as well as trivial mitral digitation.  She conveys that she continues to produce urine and denies any recent dysuria or gross  hematuria.     ED Course:  Vital signs in the ED were notable for the following: Afebrile; rates in the 70s; systolic blood pressures in the 160s; respiratory rate 18, oxygen saturation 95% on room air.  Labs were notable for the following: BMP notable for the following: Sodium 139, potassium 4.6, bicarbonate 19, anion gap 20, creatinine 12.9 compared to 12.18 on 07/06/2024, glucose 95.  proBNP 2680, without a prior proBNP data point available for reported comparison.  CBC notable for white cell count 8900, with 76% neutrophils, hemoglobin 8.8 associated Neuraceq/Norocarp properties and relative demonstrates a prior hemoglobin data point of 9.9 on 08/05/2024, platelet count 193.  COVID, influenza, RSV PCR were all negative.  Per my interpretation, EKG in ED demonstrated the following: Sinus rhythm with heart rate 69, normal intervals, no evidence of T wave or ST changes, including no evidence of ST elevation.  Imaging in the ED, per corresponding formal radiology read, was notable for the following: 2 view chest x-ray, in comparison to chest x-ray from 07/06/2024, shows interval development of right lower lobe airspace opacity concerning for pneumonia, will demonstrating no evidence of edema, effusion, or pneumothorax.  While in the ED, the following were administered: Azithromycin , Rocephin , Lasix  40 mg IV x 1 dose, DuoNeb as a treatment x 1, Solu-Medrol  125 mg IV x 1 dose.  Subsequently, the patient was admitted to [facility] for further evaluation management of community-acquired pneumonia complicated by acute COPD exacerbation as well as acute kidney injury superimposed on CKD 5.    Review of Systems: As per HPI otherwise 10 point review of systems negative.   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   CVA (cerebral vascular accident) (HCC) 07/03/2012   Diffuse pulmonary alveolar hemorrhage    Edema of right lower extremity 03/25/2017   Essential hypertension 12/08/2016   Hemoptysis 12/08/2016   High cholesterol    HLD (hyperlipidemia) 12/08/2016   Hx of adenomatous polyp of colon 04/16/2017   Hypertension    stopped taking medications 10 years ago   Hypokalemia 07/02/2012   Intra-alveolar hemorrhage 12/08/2016   Left facial numbness 07/02/2012   Sepsis (HCC) 12/08/2016   Shortness of breath 12/02/2017   Stroke (HCC)    Substance abuse (HCC) 12/08/2016   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   Tobacco abuse 12/08/2016   Type 2 diabetes mellitus without complication, with long-term current use of insulin  (HCC) 02/11/2017   Wegener's granulomatosis with renal involvement (HCC) 12/08/2016    Past Surgical History:  Procedure Laterality Date   ABDOMINAL HYSTERECTOMY  1985    Social History:  reports that she quit smoking about 5 years ago. Her smoking use included cigarettes. She started smoking about 55 years ago. She has a 10.1 pack-year smoking history. She has never used smokeless tobacco. She reports current alcohol use. She reports current drug use. Drug: Marijuana.   No Known Allergies  Family History  Problem Relation Age of Onset   Heart attack Mother    Diabetes Mother    Hypertension Mother    Stroke Mother    Cancer Father    Alcohol abuse Father    Diabetes Father    Hypertension Father    Breast cancer Neg Hx    Colon cancer Neg Hx    Esophageal cancer Neg Hx    Rectal cancer Neg Hx    Stomach cancer Neg Hx    Pancreatic cancer Neg Hx    BRCA 1/2 Neg Hx     Family history reviewed and not pertinent    Prior to Admission medications    Medication Sig Start Date End Date Taking? Authorizing Provider  albuterol  (PROVENTIL ) (2.5 MG/3ML) 0.083% nebulizer solution Take 3 mLs (2.5 mg total) by nebulization every 6 (six) hours as needed for wheezing or shortness of breath. 11/06/20   Lenon Chiquita BROCKS, DO  albuterol  (VENTOLIN  HFA) 108 (90 Base) MCG/ACT inhaler INHALE 2 PUFFS BY MOUTH EVERY 4-6 HOURS 06/29/24   Hindel, Rea, MD  amLODipine  (NORVASC ) 10 MG tablet Take 10 mg by mouth in the morning. 12/06/23   [provider]  aspirin  81 MG EC tablet TAKE 1 TABLET BY MOUTH EVERY DAY 08/09/20   Lenon Chiquita BROCKS, DO  atorvastatin  (LIPITOR) 20 MG tablet Take 1 tablet (20 mg total) by mouth daily. 12/12/23   Tharon Lung, MD  azelastine (ASTELIN) 0.1 % nasal spray Place 2 sprays into both nostrils 2 (two) times daily. Use in each nostril as directed 06/26/24   [name], MD  B-D UF III MINI PEN NEEDLES 31G X 5 MM MISC USE AS DIRECTED 12/16/23   Christia Budds, MD  benzonatate  (TESSALON ) 200 MG capsule Take 1 capsule (200 mg total) by mouth 3 (three) times daily as needed for cough. 08/14/24   Hope,  Almarie ORN, NP  budesonide-glycopyrrolate-formoterol (BREZTRI  AEROSPHERE) 160-9-4.8 MCG/ACT AERO inhaler Inhale 2 puffs into the lungs in the morning and at bedtime. 08/14/24   Hope Almarie ORN, NP  doxazosin  (CARDURA ) 2 MG tablet Take 2 mg by mouth at bedtime. 10/31/23   [provider]  fluticasone  (FLONASE ) 50 MCG/ACT nasal spray Place 2 sprays into both nostrils daily. 09/14/19   Hope Almarie ORN, NP  glucose blood (ACCU-CHEK GUIDE) test strip Check blood glucose prior to lantus  administration 04/18/23   Christia Budds, MD  guaiFENesin  (ROBITUSSIN) 100 MG/5ML liquid Take 5 mLs by mouth every 4 (four) hours as needed for cough or to loosen phlegm. Patient not taking: Reported on 06/26/2024 12/12/23   Tharon Lung, MD  insulin  glargine (LANTUS  SOLOSTAR) 100 UNIT/ML Solostar Pen Inject 5 Units into the skin at bedtime. 03/11/24    Adele Song, MD  labetalol  (NORMODYNE ) 300 MG tablet Take 300 mg by mouth 2 (two) times daily. 12/06/23   [provider]  Lancets Misc. (ACCU-CHEK SOFTCLIX LANCET DEV) KIT Check BG prior to administering insulin  04/18/23   Christia Budds, MD  pravastatin  (PRAVACHOL ) 40 MG tablet Take 40 mg by mouth at bedtime. 05/09/24   [provider]  sodium bicarbonate  650 MG tablet Take 2 tablets (1,300 mg total) by mouth 2 (two) times daily. 11/06/20   Lenon Chiquita BROCKS, DO  Spacer/Aero-Holding Raguel DEVI Please use with all inhaled medications 07/15/24   Adele Song, MD  torsemide (DEMADEX) 100 MG tablet Take 50-100 mg by mouth daily as needed. 04/16/24   [provider]     Objective    Physical Exam: Vitals:   08/17/24 0002  BP: (!) 160/79  Pulse: 73  Resp: 18  Temp: 98.3 F (36.8 C)  TempSrc: Oral  SpO2: 95%    General: appears to be stated age; alert, oriented; mildly increased work of breathing noted Skin: warm, dry, no rash Head:  AT/[place] Mouth:  Oral mucosa membranes appear moist, normal dentition Neck: supple; trachea midline Heart:  RRR; did not appreciate any M/R/G Lungs: mild b/l expiratory wheezes noted; otherwise, CTAB, did not appreciate any wheezes, or rhonchi Abdomen: + BS; soft, ND, NT Vascular: 2+ pedal pulses b/l; 2+ radial pulses b/l Extremities: trace edema in b/l LE's, no muscle wasting       Labs on Admission: I have personally reviewed following labs and imaging studies  CBC: Recent Labs  Lab 08/17/24 0026  WBC 8.9  NEUTROABS 6.8  HGB 8.8*  HCT 28.4*  MCV 81.6  PLT 193   Basic Metabolic Panel: Recent Labs  Lab 08/17/24 0026  NA 139  K 4.6  CL 100  CO2 19*  GLUCOSE 95  BUN 119*  CREATININE 12.90*  CALCIUM  7.4*   GFR: CrCl cannot be calculated (Unknown ideal weight.). Liver Function Tests: No results for input(s): AST, ALT, ALKPHOS, BILITOT, PROT, ALBUMIN in the last 168 hours. No results for  input(s): LIPASE, AMYLASE in the last 168 hours. No results for input(s): AMMONIA in the last 168 hours. Coagulation Profile: No results for input(s): INR, PROTIME in the last 168 hours. Cardiac Enzymes: No results for input(s): CKTOTAL, CKMB, CKMBINDEX, TROPONINI in the last 168 hours. BNP (last 3 results) Recent Labs    08/17/24 0026  PROBNP 2,680.0*   HbA1C: No results for input(s): HGBA1C in the last 72 hours. CBG: No results for input(s): GLUCAP in the last 168 hours. Lipid Profile: No results for input(s): CHOL, HDL, LDLCALC, TRIG, CHOLHDL, LDLDIRECT in  the last 72 hours. Thyroid  Function Tests: No results for input(s): TSH, T4TOTAL, FREET4, T3FREE, THYROIDAB in the last 72 hours. Anemia Panel: No results for input(s): VITAMINB12, FOLATE, FERRITIN, TIBC, IRON , RETICCTPCT in the last 72 hours. Urine analysis:    Component Value Date/Time   COLORURINE YELLOW 01/15/2024 1320   APPEARANCEUR CLOUDY (A) 01/15/2024 1320   LABSPEC 1.015 01/15/2024 1320   PHURINE 6.5 01/15/2024 1320   GLUCOSEU NEGATIVE 01/15/2024 1320   GLUCOSEU NEGATIVE 08/19/2019 1517   HGBUR MODERATE (A) 01/15/2024 1320   BILIRUBINUR NEGATIVE 01/15/2024 1320   KETONESUR NEGATIVE 01/15/2024 1320   PROTEINUR >300 (A) 01/15/2024 1320   UROBILINOGEN 0.2 08/19/2019 1517   NITRITE NEGATIVE 01/15/2024 1320   LEUKOCYTESUR MODERATE (A) 01/15/2024 1320    Radiological Exams on Admission: DG Chest 2 View Result Date: 08/17/2024 EXAM: 2 VIEW(S) XRAY OF THE CHEST 08/17/2024 12:40:00 AM COMPARISON: 07/06/2024 CLINICAL HISTORY: cough, shortness of breath FINDINGS: LUNGS AND PLEURA: Low lung volumes with bronchovascular crowding. Diffuse interstitial prominence. Developing right lower lobe airspace opacity. No pleural effusion. No pneumothorax. HEART AND MEDIASTINUM: Cardiomegaly. Atherosclerotic calcifications of aorta. BONES AND SOFT TISSUES: No acute osseous  abnormality. IMPRESSION: 1. Developing right lower lobe airspace opacity.Follow-up PA and lateral chest X-ray is recommended in 3-4 weeks following therapy to ensure resolution and exclude underlying malignancy. Electronically signed by: Morgane Naveau MD 08/17/2024 12:45 AM EST RP Workstation: [id number]      Assessment/Plan   Principal Problem:   CAP (community acquired pneumonia) Active Problems:   Essential hypertension   HLD (hyperlipidemia)   Acute renal failure superimposed on stage 5 chronic kidney disease, not on chronic dialysis (HCC)   DM2 (diabetes mellitus, type 2) (HCC)   History of anemia due to chronic kidney disease   COPD with acute exacerbation (HCC)       #) Community-acquired pneumonia: Diagnosis on the basis of to 3 days of progressive shortness of breath associated with worsening of cough relative to her chronic baseline cough, associated with subjective fever, and chest x-ray showing interval amount of right lower lobe airspace opacity concerning for pneumonia.  In the absence of objective fever and in the absence of leukocytosis, SIRS criteria for sepsis are not currently met.  Consequently, no indication for checking of lactic acid or blood cultures at this time.  She was started on azithromycin  and Rocephin  in the ED, which will be continued for empiric coverage of community-acquired pneumonia.  Of note, COVID, influenza, RSV PCR were all negative.  Plan: Continue azithromycin , Rocephin .  Check procalcitonin level.  Monitor strict I's and O's and daily weights.  Incentive [place], flutter valve.  As needed Tessalon  Perles.  Repeat proBNP in the morning.  Repeat CBC in the morning.                     #) Acute COPD exacerbation: in the context of a documented history of COPD, diagnosis of acute exacerbation on the basis of 2 to 3 days of progressive shortness of breath associated with increased cough, as well as presence of wheezing, with  presenting CXR showing new right lower lobe airspace opacity, concerning for pneumonia, suspected to be precipitating factor leading to acute COPD exacerbation.  While chest x-ray shows no evidence of pulmonary edema and or any evidence of pleural effusion, he is at increased risk for ensuing development of acute volume overload in the setting of her stage V CKD.  Will closely monitor ensuing volume status, as outlined below.  Additionally, ACS appears less likely in the absence of chest pain, with EKG showing no evidence of acute ischemic changes. Clinically, acute PE also appears to be less likely at this time. COVID-19/influenza/RSV PCR are negative. Outpatient respiratory regimen appears to include Breztri  inhaler as well as as needed albuterol  inhaler.  This is on the context of the patient being a former smoker.  Plan: Monitor on telemetry. Solumedrol. Scheduled duonebs q6 hours. Prn albuterol  inhaler.  CMP in the morning. Repeat CBC in the morning.  proBNP in the morning.  Check serum Mg and Phos levels. Will attempt additional chart review to evaluate most recent PFT results.  Further evaluation management of suspected presenting commune acquired pneumonia, as above, including continuation of azithromycin  and Rocephin .  Check blood gas. Add-on procalcitonin.  Flutter valve, incentive spirometry.                     #) Acute Kidney Injury superimposed on stage V CKD:   In context of a documented history of stage V CKD with baseline creatinine 10-12, with most recent prior creatinine noted to be 12.18 on 07/06/2024, there is interval increase in creatinine to 12.9 per this evening's labs.  Unclear if this is due to progressive worsening of her stage V CKD versus treatment of AKI, with increased risk for development of such in the setting of community-acquired pneumonia as well as resultant acute COPD exacerbation.  Will pursue further diagnostic evaluation, including urinalysis with  microscopy, as outlined below.  Of note, presenting potassium level was 4.6, she has mild anion gap metabolic acidosis but no overt evidence of acute volume overload, although she is certainly at risk for ensuing development of such.  Not on hemodialysis. Will admit to [facility] for nephrology availability given slight interval worsening in renal function, as above.  She has documented history of right MERS granulomatosis, as above.  Plan: monitor strict I's & O's and daily weights. Attempt to avoid nephrotoxic agents. Refrain from NSAIDs. Repeat CMP in the morning. Check serum magnesium  level.  Check urinalysis with microscopy.  Add-on random urine sodium and random urine creatinine.  Check random urine protein to creatinine ratio.  Repeat proBNP in the morning.                       #) Essential Hypertension: documented h/o such, with outpatient antihypertensive regimen including labetalol , doxazosin , amlodipine .  SBP's in the ED today: 160s mmHg. in setting of her presenting shortness of breath, will hold next dose of labetalol  due to increased risk for beta-2 antagonism induced worsening of presenting shortness of breath.  Plan: Close monitoring of subsequent BP via routine VS. resume home amlodipine , doxazosin .  Hold next dose of labetalol , as above.  Monitor strict I's and O's and daily weights.  Monitor on telemetry.                        #) Hyperlipidemia: documented h/o such. On pravastatin  as outpatient.   Plan: continue home statin.                         #) Type 2 Diabetes Mellitus: documented history of such. Home insulin  regimen: Lantus  5 units SQ nightly. Home oral hypoglycemic agents: None. presenting blood sugar: 95. Most recent A1c noted to be 5.5% when checked on 07/15/2024.  Plan: accuchecks QAC and HS with low dose SSI.  Hold home low-dose  basal insulin  for now.                        #)  Anemia of chronic kidney disease: Documented history of such, a/w with baseline hgb range  9-11, with presenting hgb consistent with this range, in the absence of any overt evidence of active bleed.     Plan: Repeat CBC in the morning.  Check PTT, INR.      DVT prophylaxis: SCD's   Code Status: Full code Family Communication: none Disposition Plan: Per Rounding Team Consults called: none;  Admission status: inpatient to [facility]     I SPENT GREATER THAN 75  MINUTES IN CLINICAL CARE TIME/MEDICAL DECISION-MAKING IN COMPLETING THIS ADMISSION.      Eva NOVAK Julann Mcgilvray DO [facility]  From 7PM - 7AM   08/17/2024, 2:49 AM

## 2024-08-17 NOTE — ED Provider Notes (Signed)
 [place] EMERGENCY DEPARTMENT AT [facility] Provider Note   CSN: [id number] Arrival date & time: 08/16/24  2353     Patient presents with: Shortness of Breath   Catherine Aguilar is a 68 y.o. female.   The history is provided by the patient.  Shortness of Breath  She has history of hypertension, diabetes, hyperlipidemia, stroke, chronic kidney disease, Wegener's granulomatosis, COPD comes in with worsening cough and difficulty breathing tonight.  She states that she chronically has a cough productive of yellow sputum and will get short of breath during coughing paroxysms.  Symptoms got worse tonight.  She used her albuterol  inhaler without any benefit.  She denies chest pain.  She denies fever, chills, sweats.  She was evasive, but apparently had not received the influenza vaccine this year.  She is a non-smoker.    Prior to Admission medications   Medication Sig Start Date End Date Taking? Authorizing Provider  albuterol  (PROVENTIL ) (2.5 MG/3ML) 0.083% nebulizer solution Take 3 mLs (2.5 mg total) by nebulization every 6 (six) hours as needed for wheezing or shortness of breath. 11/06/20   Lenon Chiquita BROCKS, DO  albuterol  (VENTOLIN  HFA) 108 (90 Base) MCG/ACT inhaler INHALE 2 PUFFS BY MOUTH EVERY 4-6 HOURS 06/29/24   Hindel, Rea, MD  amLODipine  (NORVASC ) 10 MG tablet Take 10 mg by mouth in the morning. 12/06/23   [provider]  aspirin  81 MG EC tablet TAKE 1 TABLET BY MOUTH EVERY DAY 08/09/20   Lenon Chiquita BROCKS, DO  atorvastatin  (LIPITOR) 20 MG tablet Take 1 tablet (20 mg total) by mouth daily. 12/12/23   Tharon Lung, MD  azelastine (ASTELIN) 0.1 % nasal spray Place 2 sprays into both nostrils 2 (two) times daily. Use in each nostril as directed 06/26/24   [name], MD  B-D UF III MINI PEN NEEDLES 31G X 5 MM MISC USE AS DIRECTED 12/16/23   Christia Budds, MD  benzonatate  (TESSALON ) 200 MG capsule Take 1 capsule (200 mg total) by mouth 3 (three) times daily  as needed for cough. 08/14/24   Hope Almarie ORN, NP  budesonide-glycopyrrolate-formoterol (BREZTRI  AEROSPHERE) 160-9-4.8 MCG/ACT AERO inhaler Inhale 2 puffs into the lungs in the morning and at bedtime. 08/14/24   Hope Almarie ORN, NP  doxazosin  (CARDURA ) 2 MG tablet Take 2 mg by mouth at bedtime. 10/31/23   [provider]  fluticasone  (FLONASE ) 50 MCG/ACT nasal spray Place 2 sprays into both nostrils daily. 09/14/19   Hope Almarie ORN, NP  glucose blood (ACCU-CHEK GUIDE) test strip Check blood glucose prior to lantus  administration 04/18/23   Jagadish, Mayuri, MD  guaiFENesin  (ROBITUSSIN) 100 MG/5ML liquid Take 5 mLs by mouth every 4 (four) hours as needed for cough or to loosen phlegm. Patient not taking: Reported on 06/26/2024 12/12/23   Tharon Lung, MD  insulin  glargine (LANTUS  SOLOSTAR) 100 UNIT/ML Solostar Pen Inject 5 Units into the skin at bedtime. 03/11/24   Adele Rea, MD  labetalol  (NORMODYNE ) 300 MG tablet Take 300 mg by mouth 2 (two) times daily. 12/06/23   [provider]  Lancets Misc. (ACCU-CHEK SOFTCLIX LANCET DEV) KIT Check BG prior to administering insulin  04/18/23   Christia Budds, MD  pravastatin  (PRAVACHOL ) 40 MG tablet Take 40 mg by mouth at bedtime. 05/09/24   [provider]  sodium bicarbonate  650 MG tablet Take 2 tablets (1,300 mg total) by mouth 2 (two) times daily. 11/06/20   Lenon Chiquita BROCKS, DO  Spacer/Aero-Holding Raguel DEVI Please use with all inhaled medications  07/15/24   Adele Song, MD  torsemide (DEMADEX) 100 MG tablet Take 50-100 mg by mouth daily as needed. 04/16/24   [provider]    Allergies: Patient has no known allergies.    Review of Systems  Respiratory:  Positive for shortness of breath.   All other systems reviewed and are negative.   Updated Vital Signs BP (!) 160/79 (BP Location: Left Arm)   Pulse 73   Temp 98.3 F (36.8 C) (Oral)   Resp 18   SpO2 95%   Physical Exam Vitals and nursing note  reviewed.   68 year old female, resting comfortably and in no acute distress. Vital signs are significant for elevated blood pressure. Oxygen saturation is 95% on room air, which is normal. Head is normocephalic and atraumatic. PERRLA, EOMI. Oropharynx is clear. Neck is nontender and supple without adenopathy. Lungs have diffuse, mild expiratory wheezes, inspiratory rhonchi at the bases.  There are no rales. Chest is nontender. Heart has regular rate and rhythm without murmur. Abdomen is soft, flat, nontender. Extremities have no cyanosis or edema, full range of motion is present. Skin is warm and dry without rash. Neurologic: Mental status is normal, cranial nerves are intact, moves all extremities equally.  (all labs ordered are listed, but only abnormal results are displayed) Labs Reviewed  BASIC METABOLIC PANEL WITH GFR - Abnormal; Notable for the following components:      Result Value   CO2 19 (*)    BUN 119 (*)    Creatinine, Ser 12.90 (*)    Calcium  7.4 (*)    GFR, Estimated 3 (*)    Anion gap 20 (*)    All other components within normal limits  PRO BRAIN NATRIURETIC PEPTIDE - Abnormal; Notable for the following components:   Pro Brain Natriuretic Peptide 2,680.0 (*)    All other components within normal limits  CBC WITH DIFFERENTIAL/PLATELET - Abnormal; Notable for the following components:   RBC 3.48 (*)    Hemoglobin 8.8 (*)    HCT 28.4 (*)    MCH 25.3 (*)    RDW 18.6 (*)    All other components within normal limits  RESP PANEL BY RT-PCR (RSV, FLU A&B, COVID)  [id number]  CBC WITH DIFFERENTIAL/PLATELET  COMPREHENSIVE METABOLIC PANEL WITH GFR  MAGNESIUM   PHOSPHORUS    EKG: EKG Interpretation Date/Time:  Monday August 17 2024 00:01:09 EST Ventricular Rate:  69 PR Interval:  156 QRS Duration:  103 QT Interval:  450 QTC Calculation: 483 R Axis:   -36  Text Interpretation: Sinus rhythm Probable left atrial enlargement Left ventricular hypertrophy When compared  with ECG of 07/06/2024, No significant change was found Confirmed by Raford Lenis ([id number]) on 08/17/2024 12:07:42 AM  Radiology: DG Chest 2 View Result Date: 08/17/2024 EXAM: 2 VIEW(S) XRAY OF THE CHEST 08/17/2024 12:40:00 AM COMPARISON: 07/06/2024 CLINICAL HISTORY: cough, shortness of breath FINDINGS: LUNGS AND PLEURA: Low lung volumes with bronchovascular crowding. Diffuse interstitial prominence. Developing right lower lobe airspace opacity. No pleural effusion. No pneumothorax. HEART AND MEDIASTINUM: Cardiomegaly. Atherosclerotic calcifications of aorta. BONES AND SOFT TISSUES: No acute osseous abnormality. IMPRESSION: 1. Developing right lower lobe airspace opacity.Follow-up PA and lateral chest X-ray is recommended in 3-4 weeks following therapy to ensure resolution and exclude underlying malignancy. Electronically signed by: Morgane Naveau MD 08/17/2024 12:45 AM EST RP Workstation: [id number]    Cardiac monitor shows normal sinus rhythm, per my interpretation. Procedures   Medications Ordered in the ED  azithromycin  (ZITHROMAX ) 500 mg in  sodium chloride  0.9 % 250 mL IVPB (has no administration in time range)  acetaminophen  (TYLENOL ) tablet 650 mg (has no administration in time range)    Or  acetaminophen  (TYLENOL ) suppository 650 mg (has no administration in time range)  melatonin tablet 3 mg (has no administration in time range)  ondansetron  (ZOFRAN ) injection 4 mg (has no administration in time range)  azithromycin  (ZITHROMAX ) 500 mg in sodium chloride  0.9 % 250 mL IVPB (has no administration in time range)  cefTRIAXone  (ROCEPHIN ) 1 g in sodium chloride  0.9 % 100 mL IVPB (has no administration in time range)  benzonatate  (TESSALON ) capsule 200 mg (has no administration in time range)  ipratropium-albuterol  (DUONEB) 0.5-2.5 (3) MG/3ML nebulizer solution 3 mL (3 mLs Nebulization Given 08/17/24 0047)  cefTRIAXone  (ROCEPHIN ) 2 g in sodium chloride  0.9 % 100 mL IVPB (2 g Intravenous New  Bag/Given 08/17/24 0208)  methylPREDNISolone  sodium succinate (SOLU-MEDROL ) 125 mg/2 mL injection 125 mg (125 mg Intravenous Given 08/17/24 0209)  furosemide  (LASIX ) injection 40 mg (40 mg Intravenous Given 08/17/24 0208)                                    Medical Decision Making Amount and/or Complexity of Data Reviewed Labs: ordered. Radiology: ordered.  Risk Prescription drug management. Decision regarding hospitalization.   Chronic cough which is worse today and evidence of bronchospasm on exam.  Differential diagnosis includes, but is not limited to, pneumonia, viral respiratory infection such as influenza or COVID-19 or RSV, asthma/COPD exacerbation.  I have reviewed her past records, and her Wegener's granulomatosis is involving kidneys but not lungs.  She was hospitalized from 12/10/2023 for COPD exacerbation.  I have ordered chest x-ray to rule out pneumonia, viral respiratory PCR panel, screening labs, nebulizer treatments albuterol  and ipratropium.  I have reviewed her electrocardiogram, and my interpretation is left atrial hypertrophy and left ventricular hypertrophy unchanged from prior.  I have reviewed her laboratory tests, and my interpretation is stable renal failure, slowly declining hemoglobin but in a range that she has been at previously, normal WBC and differential, negative PCR for COVID-19 and influenza and RSV.  I ordered ceftriaxone  and azithromycin  for community-acquired pneumonia.  Because of her complicated case, I feel she needs to start treatment as an inpatient.  Following nebulizer treatment, lungs were clear.  I ordered a dose of methylprednisolone .  I discussed case with Dr. Marcene of [facility] hospitalist, who agrees to admit the patient.     Final diagnoses:  Community acquired pneumonia of right lower lobe of lung  Chronic renal failure, stage 5 (HCC)  Anemia associated with chronic renal failure    ED Discharge Orders     None          Raford Lenis, MD 08/17/24 [phone]

## 2024-08-17 NOTE — Patient Instructions (Addendum)
  VISIT SUMMARY: Today, you were seen for your persistent cough and sinus symptoms. You have a history of vasculitis (Wegener's granulomatosis with renal involvement), chronic sinusitis, and chronic obstructive pulmonary disease (COPD). A recent CT scan showed scarring in your lungs, a chronic pleural effusion on your left lung, emphysema, and mild sinusitis. We discussed your current symptoms and treatment options.  YOUR PLAN: -CHRONIC OBSTRUCTIVE PULMONARY DISEASE (COPD) WITH CHRONIC COUGH AND WHEEZING: COPD is a chronic lung disease that causes obstructed airflow from the lungs, leading to breathing difficulties. You have severe obstructive airway disease with a chronic cough and wheezing. We prescribed a new inhaler, Breztri , to be used two puffs in the morning and two puffs in the evening. Continue using your albuterol  rescue inhaler every four to six hours as needed for shortness of breath. We also sent a prescription for cough medication. We will follow up in six weeks to see how you are responding to the new inhaler and perform a walk test to check your oxygen levels.  -PULMONARY FIBROSIS WITH CHRONIC LEFT PLEURAL EFFUSION: Pulmonary fibrosis is a condition where lung tissue becomes damaged and scarred, making it difficult to breathe. You have a chronic left pleural effusion, which is an accumulation of fluid around the lungs. We will continue to monitor for any signs of coughing up blood or sinus bleeding.  -CHRONIC SINUSITIS: Chronic sinusitis is a condition where the cavities around your nasal passages become inflamed and swollen for at least 12 weeks. Your CT scan showed mild sinusitis, which is worse compared to 2018. You should continue using Flonase  and Astelin nasal sprays. We have referred you to an ear, nose, and throat specialist for further evaluation.  -GRANULOMATOSIS WITH POLYANGIITIS (WEGENER'S) WITH RENAL INVOLVEMENT, IN REMISSION: Granulomatosis with polyangiitis is a rare disease  that causes inflammation of the blood vessels, which can restrict blood flow to various organs. Your condition is currently in remission, meaning there are no active signs of the disease. Continue your follow-up with your nephrologist.  -EMPHYSEMA: Emphysema is a type of COPD that involves damage to the air sacs in the lungs, leading to shortness of breath. It is crucial that you stop smoking to prevent further damage to your lungs.  -CURRENT MARIJUANA USE: Smoking marijuana can contribute to respiratory symptoms and worsen your lung condition. It is imperative that you stop smoking marijuana immediately to improve your respiratory health.  INSTRUCTIONS: Please follow up in six weeks to assess your response to the new inhaler and to perform a walk test to evaluate your oxygen levels. Continue your current medications and follow the new prescriptions as directed. We have also referred you to an ear, nose, and throat specialist for further evaluation of your sinusitis. Make sure to continue follow-ups with your nephrologist.

## 2024-08-17 NOTE — ED Notes (Signed)
 Patient urine ordered, however patient wasn't aware it was needed and she went prior to my arrival.  Urine cup is now at bedside and patient will let us  know when she has obtained specimen.  Patient denies other needs at this time.

## 2024-08-17 NOTE — Progress Notes (Signed)
 Report received from [facility]. Lori aware that room is not clean and aware to not send pt.until notified that room is clean. This RN obtained Lori's phone number and  informed her that we will call once room is clean.

## 2024-08-17 NOTE — ED Notes (Signed)
 Patient has poor vision, will require help with reading menu and channel guide on TV.

## 2024-08-17 NOTE — ED Notes (Signed)
 [facility] called [phone], report called to [place] at [phone].

## 2024-08-17 NOTE — ED Notes (Signed)
 Patient angry that she cannot have all that she wants to drink.  Explained that she is on fluid restrictions and she yelled at RN stating well I may as well get out of the bed and get all the water I want.
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
## 2024-08-18 LAB — BASIC METABOLIC PANEL WITH GFR
Anion gap: 19 — ABNORMAL HIGH (ref 5–15)
BUN: 136 mg/dL — ABNORMAL HIGH (ref 8–23)
CO2: 17 mmol/L — ABNORMAL LOW (ref 22–32)
Calcium: 7.8 mg/dL — ABNORMAL LOW (ref 8.9–10.3)
Chloride: 100 mmol/L (ref 98–111)
Creatinine, Ser: 13.15 mg/dL — ABNORMAL HIGH (ref 0.44–1.00)
GFR, Estimated: 3 mL/min — ABNORMAL LOW (ref >60)
Glucose, Bld: 151 mg/dL — ABNORMAL HIGH (ref 70–99)
Potassium: 4.5 mmol/L (ref 3.5–5.1)
Sodium: 136 mmol/L (ref 135–145)

## 2024-08-18 LAB — GLUCOSE, CAPILLARY
Glucose-Capillary: 163 mg/dL — ABNORMAL HIGH (ref 70–99)
Glucose-Capillary: 176 mg/dL — ABNORMAL HIGH (ref 70–99)
Glucose-Capillary: 160 mg/dL — ABNORMAL HIGH (ref 70–99)
Glucose-Capillary: 177 mg/dL — ABNORMAL HIGH (ref 70–99)

## 2024-08-18 MED ORDER — IPRATROPIUM-ALBUTEROL 0.5-2.5 (3) MG/3ML IN SOLN
3.0000 mL | Freq: Three times a day (TID) | RESPIRATORY_TRACT | Status: DC
  Administered 2024-08-18 – 2024-08-19 (×3): 3 mL via RESPIRATORY_TRACT
  Filled 2024-08-18 (×3): qty 3

## 2024-08-18 MED ORDER — METHYLPREDNISOLONE SODIUM SUCC 40 MG IJ SOLR
40.0000 mg | Freq: Two times a day (BID) | INTRAMUSCULAR | Status: DC
  Administered 2024-08-18 – 2024-08-19 (×2): 40 mg via INTRAVENOUS
  Filled 2024-08-18 (×2): qty 1

## 2024-08-18 MED ORDER — AZITHROMYCIN 250 MG PO TABS
500.0000 mg | ORAL_TABLET | Freq: Every day | ORAL | Status: DC
  Administered 2024-08-19: 500 mg via ORAL
  Filled 2024-08-18: qty 2

## 2024-08-18 NOTE — TOC Initial Note (Signed)
 Transition of Care [facility]) - Initial/Assessment Note    Patient Details  Name: Catherine Aguilar MRN: [id number] Date of Birth: [birth year]  [facility] CM/SW Contact:    Sudie Erminio Deems, RN Phone Number: 08/18/2024, 12:55 PM  Clinical Narrative: Patient presented for shortness of breath. PTA patient was independent from home with spouse. Patient does not use any DME in the home. Patient states she has insurance and PCP- no issues with transportation. ICM will continue to follow for additional needs as the patient progresses.                  Expected Discharge Plan: Home/Self Care Barriers to Discharge: Continued Medical Work up   Patient Goals and CMS Choice Patient states their goals for this hospitalization and ongoing recovery are:: Plan to return home  Expected Discharge Plan and Services In-house Referral: NA Discharge Planning Services: CM Consult Post Acute Care Choice: NA Living arrangements for the past 2 months: Single Family Home                   DME Agency: NA       HH Arranged: NA  Prior Living Arrangements/Services Living arrangements for the past 2 months: Single Family Home Lives with:: Spouse Patient language and need for interpreter reviewed:: Yes Do you feel safe going back to the place where you live?: Yes      Need for Family Participation in Patient Care: No (Comment) Care giver support system in place?: No (comment)   Criminal Activity/Legal Involvement Pertinent to Current Situation/Hospitalization: No - Comment as needed  Activities of Daily Living   ADL Screening (condition at time of admission) Independently performs ADLs?: Yes (appropriate for developmental age) Is the patient deaf or have difficulty hearing?: No Does the patient have difficulty seeing, even when wearing glasses/contacts?: No Does the patient have difficulty concentrating, remembering, or making decisions?: No  Permission Sought/Granted Permission  sought to share information with : Family Supports, Case Manager                Emotional Assessment Appearance:: Appears stated age Attitude/Demeanor/Rapport: Engaged Affect (typically observed): Appropriate Orientation: : Oriented to Self, Oriented to Place, Oriented to  Time, Oriented to Situation Alcohol / Substance Use: Not Applicable Psych Involvement: No (comment)  Admission diagnosis:  CAP (community acquired pneumonia) [J18.9] Anemia associated with chronic renal failure [N18.9, D63.1] Chronic renal failure, stage 5 (HCC) [N18.5] Community acquired pneumonia of right lower lobe of lung [J18.9] Patient Active Problem List   Diagnosis Date Noted   CAP (community acquired pneumonia) 08/17/2024   Anemia due to stage 5 chronic kidney disease treated with erythropoietin (HCC) 05/17/2024   Positive RPR test 12/11/2023   History of anemia due to chronic kidney disease 12/10/2023   COPD with acute exacerbation (HCC) 12/10/2023   Chronic rhinitis 11/11/2020   Asthma with COPD (chronic obstructive pulmonary disease) (HCC) 11/25/2017   Hx of adenomatous polyp of colon 04/16/2017   Chronic kidney disease (CKD), stage IV (severe) (HCC) 02/11/2017   DM2 (diabetes mellitus, type 2) (HCC) 02/11/2017   Essential hypertension 12/08/2016   HLD (hyperlipidemia) 12/08/2016   Granulomatosis with polyangiitis (HCC) 12/08/2016   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   Acute renal failure superimposed on stage 5 chronic kidney disease, not on chronic dialysis (HCC) 12/08/2016   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 [place] [address] Phone: [phone] Fax: [phone]     Social Drivers of Health (SDOH) Social History: SDOH Screenings   Food Insecurity: No Food Insecurity  (08/18/2024)  Housing: Low Risk  (08/18/2024)  Transportation Needs: No Transportation Needs (08/18/2024)  Utilities: Not At Risk (08/18/2024)  Alcohol Screen: Low Risk  (05/21/2023)  Depression (PHQ2-9): Low Risk  (07/15/2024)  Financial Resource Strain: Low Risk  (06/11/2024)  Physical Activity: Inactive (06/11/2024)  Social Connections: Moderately Integrated (08/18/2024)  Stress: No Stress Concern Present (06/11/2024)  Tobacco Use: Medium Risk (08/17/2024)  Health Literacy: Adequate Health Literacy (06/11/2024)   SDOH Interventions:     Readmission Risk Interventions     No data to display

## 2024-08-18 NOTE — Plan of Care (Signed)

## 2024-08-18 NOTE — Progress Notes (Addendum)
 PROGRESS NOTE  Catherine Aguilar FMW:[id number] DOB: [birth year] DOA: 08/17/2024 PCP: Adele Song, MD   LOS: 1 day   Brief narrative:  68 year old female with past medical history of CKD stage V not on dialysis, Wegener's granulomatosis involving kidneys but not lungs, COPD, diabetes, anemia presented to hospital with shortness of breath nonproductive cough.  Initially in the ED patient had elevated blood pressure.  Initial CBC showed hemoglobin at 8.8.  proBNP elevated at 2680, creatinine of 12.9.  COVID influenza and RSV was negative.  Chest x-ray with right lower lobe airspace opacity.  Patient was then admitted to hospital for CAP complicated by acute COPD exacerbation, AKI superimposed on CKD stage V.     Assessment/Plan: Principal Problem:   CAP (community acquired pneumonia) Active Problems:   Essential hypertension   HLD (hyperlipidemia)   Acute renal failure superimposed on stage 5 chronic kidney disease, not on chronic dialysis (HCC)   DM2 (diabetes mellitus, type 2) (HCC)   History of anemia due to chronic kidney disease   COPD with acute exacerbation (HCC)  acute hypoxic respiratory failure secondary to Community-acquired pneumonia Currently on supplemental oxygen, on nasal cannula at 2 L/min.  Afebrile.  Currently on Rocephin  and Zithromax  continue for now.  Still has some spells of cough and wheezing.  Will continue to attempt at weaning if possible.   Acute COPD exacerbation With significant cough and wheezing.  Continue oxygen nebulizers bronchodilators and steroids.  Had significant wheezing on presentation.  Currently on 80 mg of IV Solu-Medrol  twice daily.  Continue frequent nebulizer.  Will decrease to 40 mg IV twice daily..  Continue incentive spirometry flutter valve.  On supplemental oxygen will continue to wean as able.  AKI on CKD stage V not on hemodialysis  creatinine today at 13.1.  Continue to monitor.  Creatinine a month back was 12.1.  Creatinine was 12.9 on  presentation.  Could be secondary to steroids as well.  Will continue to monitor.  Patient denies any nausea vomiting impaired appetite and has been urinating well.  Follows up with [facility] as outpatient.  Protein creatinine ratio of 2.3.  Urinalysis today was 60.  Will add strict intake and output charting Daily weights.   Anemia of CKD with baseline around 9-10. Latest hemoglobin of 8.7.  Mild hyperkalemia on presentation.  Has improved at this time.  Latest potassium of 4.5 from initial 5.3.  Check BMP in AM.   Diabetes mellitus type 2 Continue sliding scale insulin  and long-acting insulin .  Latest POC glucose of 163.  DVT prophylaxis: SCDs Start: 08/17/24 0245   Disposition: Home likely in 1 to 2 days  Status is: Inpatient Remains inpatient appropriate because: Pending clinical improvement,    Code Status:     Code Status: Full Code  Family Communication: None  Consultants: None  Procedures: None  Anti-infectives:  Rocephin  and Zithromax  IV  Anti-infectives (From admission, onward)    Start     Dose/Rate Route Frequency Ordered Stop   08/19/24 1000  azithromycin  (ZITHROMAX ) tablet 500 mg        500 mg Oral Daily 08/18/24 0717     08/18/24 0500  azithromycin  (ZITHROMAX ) 500 mg in sodium chloride  0.9 % 250 mL IVPB  Status:  Discontinued        500 mg 250 mL/hr over 60 Minutes Intravenous Every 24 hours 08/17/24 0248 08/18/24 0717   08/17/24 2200  cefTRIAXone  (ROCEPHIN ) 1 g in sodium chloride  0.9 % 100 mL IVPB  1 g 200 mL/hr over 30 Minutes Intravenous Every 24 hours 08/17/24 0248     08/17/24 0200  cefTRIAXone  (ROCEPHIN ) 2 g in sodium chloride  0.9 % 100 mL IVPB        2 g 200 mL/hr over 30 Minutes Intravenous  Once 08/17/24 0146 08/17/24 0238   08/17/24 0200  azithromycin  (ZITHROMAX ) 500 mg in sodium chloride  0.9 % 250 mL IVPB        500 mg 250 mL/hr over 60 Minutes Intravenous  Once 08/17/24 0146 08/17/24 0531        Subjective: Today, patient was seen and examined at bedside.  Patient still complains of wheezing cough and shortness of breath on supplemental oxygen.  Denies any nausea vomiting abdominal pain diarrhea.  Has good appetite.  Denies any fever chills or rigor.  Objective: Vitals:   08/18/24 0500 08/18/24 0830  BP: (!) 164/83 (!) 166/77  Pulse: 86 87  Resp: (!) 22 20  Temp: 98.3 F (36.8 C) 98.6 F (37 C)  SpO2:  99%    Intake/Output Summary (Last 24 hours) at 08/18/2024 1022 Last data filed at 08/18/2024 0800 Gross per 24 hour  Intake 1180 ml  Output --  Net 1180 ml   Filed Weights   08/17/24 1700 08/18/24 0601  Weight: 84.9 kg 84.8 kg   Body mass index is 29.27 kg/m.   Physical Exam: GENERAL: Patient is alert awake and oriented. Not in obvious distress.  On nasal cannula oxygen HENT: No scleral pallor or icterus. Pupils equally reactive to light. Oral mucosa is moist NECK: is supple, no gross swelling noted. CHEST: Diminished breath sounds bilaterally.  Diffuse expiratory wheezing noted. CVS: S1 and S2 heard, no murmur. Regular rate and rhythm.  ABDOMEN: Soft, non-tender, bowel sounds are present. EXTREMITIES: No edema. CNS: Cranial nerves are intact. No focal motor deficits. SKIN: warm and dry without rashes.  Data Review: I have personally reviewed the following laboratory data and studies,  CBC: Recent Labs  Lab 08/17/24 0026 08/17/24 0630  WBC 8.9 9.1  NEUTROABS 6.8 8.7*  HGB 8.8* 8.7*  HCT 28.4* 28.4*  MCV 81.6 82.8  PLT 193 194   Basic Metabolic Panel: Recent Labs  Lab 08/17/24 0026 08/17/24 0630 08/18/24 0319  NA 139 135 136  K 4.6 5.3* 4.5  CL 100 98 100  CO2 19* 17* 17*  GLUCOSE 95 250* 151*  BUN 119* 121* 136*  CREATININE 12.90* 12.60* 13.15*  CALCIUM  7.4* 7.6* 7.8*  MG  --  2.0  --   PHOS  --  9.0*  --    Liver Function Tests: Recent Labs  Lab 08/17/24 0630  AST 27  ALT 26  ALKPHOS 90  BILITOT 0.3  PROT 6.9  ALBUMIN 3.8    No results for input(s): LIPASE, AMYLASE in the last 168 hours. No results for input(s): AMMONIA in the last 168 hours. Cardiac Enzymes: No results for input(s): CKTOTAL, CKMB, CKMBINDEX, TROPONINI in the last 168 hours. BNP (last 3 results) Recent Labs    12/10/23 0859 12/20/23 0353  BNP 245.6* 96.7    ProBNP (last 3 results) Recent Labs    08/17/24 0026 08/17/24 0630  PROBNP 2,680.0* 2,404.0*    CBG: Recent Labs  Lab 08/17/24 0754 08/17/24 1126 08/17/24 1659 08/17/24 2053 08/18/24 0755  GLUCAP 244* 211* 147* 186* 163*   Recent Results (from the past 240 hours)  Resp panel by RT-PCR (RSV, Flu A&B, Covid) Anterior Nasal Swab     Status: None  Collection Time: 08/17/24 12:25 AM   Specimen: Anterior Nasal Swab  Result Value Ref Range Status   SARS Coronavirus 2 by RT PCR NEGATIVE NEGATIVE Final    Comment: (NOTE) SARS-CoV-2 target nucleic acids are NOT DETECTED.  The SARS-CoV-2 RNA is generally detectable in upper respiratory specimens during the acute phase of infection. The lowest concentration of SARS-CoV-2 viral copies this assay can detect is 138 copies/mL. A negative result does not preclude SARS-Cov-2 infection and should not be used as the sole basis for treatment or other patient management decisions. A negative result may occur with  improper specimen collection/handling, submission of specimen other than nasopharyngeal swab, presence of viral mutation(s) within the areas targeted by this assay, and inadequate number of viral copies(<138 copies/mL). A negative result must be combined with clinical observations, patient history, and epidemiological information. The expected result is Negative.  Fact Sheet for Patients:  bloggercourse.com  Fact Sheet for Healthcare Providers:  seriousbroker.it  This test is no t yet approved or cleared by the [place]  FDA and  has been authorized  for detection and/or diagnosis of SARS-CoV-2 by FDA under an Emergency Use Authorization (EUA). This EUA will remain  in effect (meaning this test can be used) for the duration of the COVID-19 declaration under Section 564(b)(1) of the Act, 21 U.S.C.section 360bbb-3(b)(1), unless the authorization is terminated  or revoked sooner.       Influenza A by PCR NEGATIVE NEGATIVE Final   Influenza B by PCR NEGATIVE NEGATIVE Final    Comment: (NOTE) The Xpert Xpress SARS-CoV-2/FLU/RSV plus assay is intended as an aid in the diagnosis of influenza from Nasopharyngeal swab specimens and should not be used as a sole basis for treatment. Nasal washings and aspirates are unacceptable for Xpert Xpress SARS-CoV-2/FLU/RSV testing.  Fact Sheet for Patients: bloggercourse.com  Fact Sheet for Healthcare Providers: seriousbroker.it  This test is not yet approved or cleared by the [place]  FDA and has been authorized for detection and/or diagnosis of SARS-CoV-2 by FDA under an Emergency Use Authorization (EUA). This EUA will remain in effect (meaning this test can be used) for the duration of the COVID-19 declaration under Section 564(b)(1) of the Act, 21 U.S.C. section 360bbb-3(b)(1), unless the authorization is terminated or revoked.     Resp Syncytial Virus by PCR NEGATIVE NEGATIVE Final    Comment: (NOTE) Fact Sheet for Patients: bloggercourse.com  Fact Sheet for Healthcare Providers: seriousbroker.it  This test is not yet approved or cleared by the [place]  FDA and has been authorized for detection and/or diagnosis of SARS-CoV-2 by FDA under an Emergency Use Authorization (EUA). This EUA will remain in effect (meaning this test can be used) for the duration of the COVID-19 declaration under Section 564(b)(1) of the Act, 21 U.S.C. section 360bbb-3(b)(1), unless the authorization is  terminated or revoked.  Performed at [facility], [address] [place], [place] [address]      Studies: DG Chest 2 View Result Date: 08/17/2024 EXAM: 2 VIEW(S) XRAY OF THE CHEST 08/17/2024 12:40:00 AM COMPARISON: 07/06/2024 CLINICAL HISTORY: cough, shortness of breath FINDINGS: LUNGS AND PLEURA: Low lung volumes with bronchovascular crowding. Diffuse interstitial prominence. Developing right lower lobe airspace opacity. No pleural effusion. No pneumothorax. HEART AND MEDIASTINUM: Cardiomegaly. Atherosclerotic calcifications of aorta. BONES AND SOFT TISSUES: No acute osseous abnormality. IMPRESSION: 1. Developing right lower lobe airspace opacity.Follow-up PA and lateral chest X-ray is recommended in 3-4 weeks following therapy to ensure resolution and exclude underlying malignancy. Electronically signed by:  Morgane Naveau MD 08/17/2024 12:45 AM EST RP Workstation: [id number]      Vernal Alstrom, MD  [facility] 08/18/2024  If 7PM-7AM, please contact night-coverage

## 2024-08-18 NOTE — Progress Notes (Signed)
 Patient was educated on the use of oxygen, use of  bedside commode when in resp distress. She refused both. Will continue to monitor.

## 2024-08-19 ENCOUNTER — Encounter (HOSPITAL_COMMUNITY)

## 2024-08-19 ENCOUNTER — Telehealth: Payer: Self-pay | Admitting: Primary Care

## 2024-08-19 DIAGNOSIS — J849 Interstitial pulmonary disease, unspecified: Secondary | ICD-10-CM

## 2024-08-19 DIAGNOSIS — M3131 Wegener's granulomatosis with renal involvement: Secondary | ICD-10-CM

## 2024-08-19 DIAGNOSIS — J189 Pneumonia, unspecified organism: Secondary | ICD-10-CM | POA: Diagnosis not present

## 2024-08-19 LAB — BASIC METABOLIC PANEL WITH GFR
Anion gap: 16 — ABNORMAL HIGH (ref 5–15)
BUN: 150 mg/dL — ABNORMAL HIGH (ref 8–23)
CO2: 19 mmol/L — ABNORMAL LOW (ref 22–32)
Calcium: 7.7 mg/dL — ABNORMAL LOW (ref 8.9–10.3)
Chloride: 101 mmol/L (ref 98–111)
Creatinine, Ser: 13.23 mg/dL — ABNORMAL HIGH (ref 0.44–1.00)
GFR, Estimated: 3 mL/min — ABNORMAL LOW (ref >60)
Glucose, Bld: 165 mg/dL — ABNORMAL HIGH (ref 70–99)
Potassium: 4 mmol/L (ref 3.5–5.1)
Sodium: 136 mmol/L (ref 135–145)

## 2024-08-19 LAB — CBC
HCT: 29.7 % — ABNORMAL LOW (ref 36.0–46.0)
Hemoglobin: 9.3 g/dL — ABNORMAL LOW (ref 12.0–15.0)
MCH: 25.6 pg — ABNORMAL LOW (ref 26.0–34.0)
MCHC: 31.3 g/dL (ref 30.0–36.0)
MCV: 81.8 fL (ref 80.0–100.0)
Platelets: 208 K/uL (ref 150–400)
RBC: 3.63 MIL/uL — ABNORMAL LOW (ref 3.87–5.11)
RDW: 18.4 % — ABNORMAL HIGH (ref 11.5–15.5)
WBC: 12.1 K/uL — ABNORMAL HIGH (ref 4.0–10.5)
nRBC: 0 % (ref 0.0–0.2)

## 2024-08-19 LAB — GLUCOSE, CAPILLARY
Glucose-Capillary: 138 mg/dL — ABNORMAL HIGH (ref 70–99)
Glucose-Capillary: 196 mg/dL — ABNORMAL HIGH (ref 70–99)

## 2024-08-19 LAB — MAGNESIUM: Magnesium: 2.1 mg/dL (ref 1.7–2.4)

## 2024-08-19 MED ORDER — CEFUROXIME AXETIL 250 MG PO TABS: 750.0000 mg | ORAL_TABLET | Freq: Every day | ORAL | 0 refills | Status: AC

## 2024-08-19 MED ORDER — DOXYCYCLINE HYCLATE 100 MG PO TABS: 100.0000 mg | ORAL_TABLET | Freq: Two times a day (BID) | ORAL | 0 refills | Status: AC

## 2024-08-19 MED ORDER — PREDNISONE 10 MG PO TABS: 30.0000 mg | ORAL_TABLET | Freq: Every day | ORAL | 0 refills | Status: AC

## 2024-08-19 NOTE — Telephone Encounter (Signed)
 She had blood and protein in her urine. We don't need to repeat UA Needs labs, ESR and ANCA were not drawn Please refer to nephrology re: wegener's vasculitis

## 2024-08-19 NOTE — Telephone Encounter (Signed)
 I called and spoke to pt. Pt informed of Beth's note and pt stated she was recently in the hospital and already did a UA. Pt does not think she needs to do those labs that [facility] and Dr Geronimo suggested and the hospital already drew her blood. I inform,ed pt that the hospital may not have drawn what our office is looking for. Pt would like the provider to look anyway.   Pt also needs a referral to nephrology.

## 2024-08-19 NOTE — Telephone Encounter (Signed)
 Please let patient know Dr. Geronimo is concerned for active vasculitis. He recommended checking UA and labs (ESR and ANCA). She should get urinalysis and labs done at [facility]. Needs to make sure she has an upcoming visit with nephrology soon

## 2024-08-19 NOTE — Discharge Summary (Signed)
 Physician Discharge Summary  Catherine Aguilar FMW:[id number] DOB: 1956-01-24 DOA: 08/17/2024  PCP: Adele Song, MD  Admit date: 08/17/2024 Discharge date: 08/19/2024  Admitted From: Home  Discharge disposition: Home   Recommendations for Outpatient Follow-Up:   Follow up with your primary care provider in one week.  Check CBC, BMP, magnesium  in the next visit Follow-up with [facility] as scheduled by the clinic   Discharge Diagnosis:   Principal Problem:   CAP (community acquired pneumonia) Active Problems:   Essential hypertension   HLD (hyperlipidemia)   Acute renal failure superimposed on stage 5 chronic kidney disease, not on chronic dialysis (HCC)   DM2 (diabetes mellitus, type 2) (HCC)   History of anemia due to chronic kidney disease   COPD with acute exacerbation (HCC)   Discharge Condition: Improved.  Diet recommendation: Low sodium, heart healthy.  Carbohydrate-modified.    Wound care: None.  Code status: Full.   History of Present Illness:   68 year old female with past medical history of CKD stage V not on dialysis, Wegener's granulomatosis involving kidneys but not lungs, COPD, diabetes, anemia presented to hospital with shortness of breath nonproductive cough.  Initially in the ED, patient had elevated blood pressure.  Initial CBC showed hemoglobin at 8.8.  proBNP elevated at 2680, creatinine of 12.9.  COVID influenza and RSV was negative.  Chest x-ray with right lower lobe airspace opacity.  Patient was then admitted to hospital for CAP complicated by acute COPD exacerbation, AKI superimposed on CKD stage V.    Hospital Course:   Following conditions were addressed during hospitalization as listed below,  acute hypoxic respiratory failure secondary to Community-acquired pneumonia Was initially on supplemental oxygen which she has been weaned to room air subsequently.  Patient received Rocephin  and Zithromax  IV steroids during  hospitalization for cough and wheezing and shortness of breath with improved with treatment.  Was advised to continue to complete course of antibiotic and short course of prednisone .  Acute COPD exacerbation With significant cough and wheezing on presentation..  During hospitalization patient received oxygen nebulizers bronchodilators and high-dose IV steroids.  Patient was advised incentive spirometry and short course prednisone  on discharge with antibiotics to complete course.  She was advised to continue inhalers at home.     AKI on CKD stage V not on hemodialysis Creatinine on presentation was 2.9 compared to 12.1 a month back [place] patient had mild AKI.  She however received IV steroids which could bring creatinine slightly higher.  She had no other symptoms and volume status was normal.  She was advised to follow-up with [facility] as outpatient after discharge.  Anemia of CKD with baseline around 9-10. Hemoglobin today at 9.3 from 8.7.   Mild hyperkalemia on presentation.  Improved.  Latest potassium of 4.0 from initial 5.3.     Diabetes mellitus type 2 Continue Lantus  and diabetic diet at home.   Disposition.  At this time, patient is stable for disposition home with outpatient PCP and nephrology follow-up.  Medical Consultants:   None.  Procedures:    None Subjective:   Today, patient was seen and examined at bedside.  Complains of feeling much better today.  Less wheezing and shortness of breath wants to go home.  Discharge Exam:   Vitals:   08/19/24 0835 08/19/24 1153  BP:  (!) 158/71  Pulse:  88  Resp: 16 17  Temp:  98 F (36.7 C)  SpO2:  100%   Vitals:   08/19/24 0420 08/19/24  9193 08/19/24 0835 08/19/24 1153  BP: (!) 169/81   (!) 158/71  Pulse: 92   88  Resp: 15  16 17   Temp: 98 F (36.7 C) 98.3 F (36.8 C)  98 F (36.7 C)  TempSrc: Oral Oral  Oral  SpO2: 94%   100%  Weight: 85.1 kg     Height:        General: Alert awake, not  in obvious distress, on room air HENT: pupils equally reacting to light,  No scleral pallor or icterus noted. Oral mucosa is moist.  Chest: Diminished breath sounds bilaterally.  Minimal wheezes noted. CVS: S1 &S2 heard. No murmur.  Regular rate and rhythm. Abdomen: Soft, nontender, nondistended.  Bowel sounds are heard.   Extremities: No cyanosis, clubbing or edema.  Peripheral pulses are palpable. Psych: Alert, awake and oriented, normal mood CNS:  No cranial nerve deficits.  Power equal in all extremities.   Skin: Warm and dry.  No rashes noted.  The results of significant diagnostics from this hospitalization (including imaging, microbiology, ancillary and laboratory) are listed below for reference.     Diagnostic Studies:   DG Chest 2 View Result Date: 08/17/2024 EXAM: 2 VIEW(S) XRAY OF THE CHEST 08/17/2024 12:40:00 AM COMPARISON: 07/06/2024 CLINICAL HISTORY: cough, shortness of breath FINDINGS: LUNGS AND PLEURA: Low lung volumes with bronchovascular crowding. Diffuse interstitial prominence. Developing right lower lobe airspace opacity. No pleural effusion. No pneumothorax. HEART AND MEDIASTINUM: Cardiomegaly. Atherosclerotic calcifications of aorta. BONES AND SOFT TISSUES: No acute osseous abnormality. IMPRESSION: 1. Developing right lower lobe airspace opacity.Follow-up PA and lateral chest X-ray is recommended in 3-4 weeks following therapy to ensure resolution and exclude underlying malignancy. Electronically signed by: Kate Plummer MD 08/17/2024 12:45 AM EST RP Workstation: [id number]     Labs:   Basic Metabolic Panel: Recent Labs  Lab 08/17/24 0026 08/17/24 0630 08/18/24 0319 08/19/24 0429  NA 139 135 136 136  K 4.6 5.3* 4.5 4.0  CL 100 98 100 101  CO2 19* 17* 17* 19*  GLUCOSE 95 250* 151* 165*  BUN 119* 121* 136* 150*  CREATININE 12.90* 12.60* 13.15* 13.23*  CALCIUM  7.4* 7.6* 7.8* 7.7*  MG  --  2.0  --  2.1  PHOS  --  9.0*  --   --    GFR Estimated Creatinine  Clearance: 4.5 mL/min (A) (by C-G formula based on SCr of 13.23 mg/dL (H)). Liver Function Tests: Recent Labs  Lab 08/17/24 0630  AST 27  ALT 26  ALKPHOS 90  BILITOT 0.3  PROT 6.9  ALBUMIN 3.8   No results for input(s): LIPASE, AMYLASE in the last 168 hours. No results for input(s): AMMONIA in the last 168 hours. Coagulation profile Recent Labs  Lab 08/17/24 0630  INR 1.1    CBC: Recent Labs  Lab 08/17/24 0026 08/17/24 0630 08/19/24 0429  WBC 8.9 9.1 12.1*  NEUTROABS 6.8 8.7*  --   HGB 8.8* 8.7* 9.3*  HCT 28.4* 28.4* 29.7*  MCV 81.6 82.8 81.8  PLT 193 194 208   Cardiac Enzymes: No results for input(s): CKTOTAL, CKMB, CKMBINDEX, TROPONINI in the last 168 hours. BNP: Invalid input(s): POCBNP CBG: Recent Labs  Lab 08/18/24 1144 08/18/24 1552 08/18/24 2110 08/19/24 0807 08/19/24 1153  GLUCAP 176* 160* 177* 138* 196*   D-Dimer No results for input(s): DDIMER in the last 72 hours. Hgb A1c No results for input(s): HGBA1C in the last 72 hours. Lipid Profile No results for input(s): CHOL, HDL, LDLCALC, TRIG, CHOLHDL, LDLDIRECT  in the last 72 hours. Thyroid  function studies No results for input(s): TSH, T4TOTAL, T3FREE, THYROIDAB in the last 72 hours.  Invalid input(s): FREET3 Anemia work up No results for input(s): VITAMINB12, FOLATE, FERRITIN, TIBC, IRON , RETICCTPCT in the last 72 hours. Microbiology Recent Results (from the past 240 hours)  Resp panel by RT-PCR (RSV, Flu A&B, Covid) Anterior Nasal Swab     Status: None   Collection Time: 08/17/24 12:25 AM   Specimen: Anterior Nasal Swab  Result Value Ref Range Status   SARS Coronavirus 2 by RT PCR NEGATIVE NEGATIVE Final    Comment: (NOTE) SARS-CoV-2 target nucleic acids are NOT DETECTED.  The SARS-CoV-2 RNA is generally detectable in upper respiratory specimens during the acute phase of infection. The lowest concentration of SARS-CoV-2 viral copies  this assay can detect is 138 copies/mL. A negative result does not preclude SARS-Cov-2 infection and should not be used as the sole basis for treatment or other patient management decisions. A negative result may occur with  improper specimen collection/handling, submission of specimen other than nasopharyngeal swab, presence of viral mutation(s) within the areas targeted by this assay, and inadequate number of viral copies(<138 copies/mL). A negative result must be combined with clinical observations, patient history, and epidemiological information. The expected result is Negative.  Fact Sheet for Patients:  bloggercourse.com  Fact Sheet for Healthcare Providers:  seriousbroker.it  This test is no t yet approved or cleared by the [place]  FDA and  has been authorized for detection and/or diagnosis of SARS-CoV-2 by FDA under an Emergency Use Authorization (EUA). This EUA will remain  in effect (meaning this test can be used) for the duration of the COVID-19 declaration under Section 564(b)(1) of the Act, 21 U.S.C.section 360bbb-3(b)(1), unless the authorization is terminated  or revoked sooner.       Influenza A by PCR NEGATIVE NEGATIVE Final   Influenza B by PCR NEGATIVE NEGATIVE Final    Comment: (NOTE) The Xpert Xpress SARS-CoV-2/FLU/RSV plus assay is intended as an aid in the diagnosis of influenza from Nasopharyngeal swab specimens and should not be used as a sole basis for treatment. Nasal washings and aspirates are unacceptable for Xpert Xpress SARS-CoV-2/FLU/RSV testing.  Fact Sheet for Patients: bloggercourse.com  Fact Sheet for Healthcare Providers: seriousbroker.it  This test is not yet approved or cleared by the [place]  FDA and has been authorized for detection and/or diagnosis of SARS-CoV-2 by FDA under an Emergency Use Authorization (EUA). This EUA  will remain in effect (meaning this test can be used) for the duration of the COVID-19 declaration under Section 564(b)(1) of the Act, 21 U.S.C. section 360bbb-3(b)(1), unless the authorization is terminated or revoked.     Resp Syncytial Virus by PCR NEGATIVE NEGATIVE Final    Comment: (NOTE) Fact Sheet for Patients: bloggercourse.com  Fact Sheet for Healthcare Providers: seriousbroker.it  This test is not yet approved or cleared by the [place]  FDA and has been authorized for detection and/or diagnosis of SARS-CoV-2 by FDA under an Emergency Use Authorization (EUA). This EUA will remain in effect (meaning this test can be used) for the duration of the COVID-19 declaration under Section 564(b)(1) of the Act, 21 U.S.C. section 360bbb-3(b)(1), unless the authorization is terminated or revoked.  Performed at [facility], [address] [place], [place] [address]      Discharge Instructions:   Discharge Instructions     Call MD for:  severe uncontrolled pain   Complete by: As directed  Call MD for:  temperature >100.4   Complete by: As directed    Diet - low sodium heart healthy   Complete by: As directed    Discharge instructions   Complete by: As directed    Follow-up with your primary care provider in 1 week.  Complete course of antibiotic.  Use inhalers at home.  Do not overexert.  Seek medical attention for worsening symptoms.  Follow-up with your nephrologist in 1 week.   Increase activity slowly   Complete by: As directed    No wound care   Complete by: As directed       Allergies as of 08/19/2024   No Known Allergies      Medication List     STOP taking these medications    atorvastatin  20 MG tablet Commonly known as: Lipitor       TAKE these medications    Accu-Chek Guide test strip Generic drug: glucose blood Check blood glucose prior to lantus  administration    Accu-Chek Softclix Lancet Dev Kit Check BG prior to administering insulin    albuterol  (2.5 MG/3ML) 0.083% nebulizer solution Commonly known as: PROVENTIL  Take 3 mLs (2.5 mg total) by nebulization every 6 (six) hours as needed for wheezing or shortness of breath. What changed: Another medication with the same name was changed. Make sure you understand how and when to take each.   albuterol  108 (90 Base) MCG/ACT inhaler Commonly known as: VENTOLIN  HFA INHALE 2 PUFFS BY MOUTH EVERY 4-6 HOURS What changed:  how much to take how to take this when to take this additional instructions   amLODipine  10 MG tablet Commonly known as: NORVASC  Take 10 mg by mouth at bedtime.   aspirin  EC 81 MG tablet TAKE 1 TABLET BY MOUTH EVERY DAY   azelastine 0.1 % nasal spray Commonly known as: ASTELIN Place 2 sprays into both nostrils 2 (two) times daily. Use in each nostril as directed What changed:  when to take this reasons to take this additional instructions   B-D UF III MINI PEN NEEDLES 31G X 5 MM Misc Generic drug: Insulin  Pen Needle USE AS DIRECTED   benzonatate  200 MG capsule Commonly known as: TESSALON  Take 1 capsule (200 mg total) by mouth 3 (three) times daily as needed for cough.   Breztri  Aerosphere 160-9-4.8 MCG/ACT Aero inhaler Generic drug: budesonide-glycopyrrolate-formoterol Inhale 2 puffs into the lungs in the morning and at bedtime.   cefUROXime 250 MG tablet Commonly known as: CEFTIN Take 3 tablets (750 mg total) by mouth daily for 3 days.   doxazosin  2 MG tablet Commonly known as: CARDURA  Take 2 mg by mouth in the morning and at bedtime.   doxycycline  100 MG tablet Commonly known as: VIBRA -TABS Take 1 tablet (100 mg total) by mouth 2 (two) times daily for 3 days.   fluticasone  50 MCG/ACT nasal spray Commonly known as: FLONASE  Place 2 sprays into both nostrils daily.   guaiFENesin  100 MG/5ML liquid Commonly known as: ROBITUSSIN Take 5 mLs by mouth every 4  (four) hours as needed for cough or to loosen phlegm.   labetalol  300 MG tablet Commonly known as: NORMODYNE  Take 300 mg by mouth in the morning and at bedtime.   Lantus  SoloStar 100 UNIT/ML Solostar Pen Generic drug: insulin  glargine Inject 5 Units into the skin at bedtime.   pravastatin  40 MG tablet Commonly known as: PRAVACHOL  Take 40 mg by mouth at bedtime.   predniSONE  10 MG tablet Commonly known as: DELTASONE  Take 3 tablets (  30 mg total) by mouth daily with breakfast for 3 days.   sodium bicarbonate  650 MG tablet Take 2 tablets (1,300 mg total) by mouth 2 (two) times daily.   Spacer/Aero-Holding [employer] Please use with all inhaled medications   torsemide 100 MG tablet Commonly known as: DEMADEX Take 100 mg by mouth in the morning.        Follow-up Information     Hindel, Leah, MD Follow up in 1 week(s).   Specialty: Family Medicine Contact information: [address] [place] [address] [phone]                  Time coordinating discharge: 39 minutes  Signed:  Srija Southard  [facility] 08/19/2024, 2:46 PM

## 2024-08-19 NOTE — Plan of Care (Signed)

## 2024-08-20 NOTE — Telephone Encounter (Signed)
 Patient returned my call, I provided the information per Almarie Ferrari NP, the patient was wanting to make an appointment to get her medication refilled.  I asked her what medication and she stated Pravastatin .  I let her know that she was just seen in our office and Pravastatin  is not a medication that we prescribe, however I would see who prescribed it to her.  When I looked at the history, it only said historical provider.  I let her know that and she would need to either contact her PCP or her heart doctor if she has one.  She said she really does not see a PCP.  She sees a kidney doctor at [address] In [place].  When I looked up the address, that is [facility].  It looks like she sees Dr. Manuelita Barters.  It was recommended that she vascular back in April 2025 after her d/c from the hospital, however she did not go.  I asked her is she wanted to have her labs drawn there as she was reluctant to schedule an appointment with me to have them drawn here.  She said she would like to have it drawn there.  I let her know I would make Almarie aware that she already has a kidney doctor and she would like to have her labs drawn at their office.  Beth, The patient already has a nephrologist at [facility].  She would like to have her labs drawn there if that is ok with you.  Please advise.  Thank you.

## 2024-08-20 NOTE — Telephone Encounter (Signed)
 I called [facility], [phone] and verified that she is a patient there, she is a patient of Dr. Norine.  I verified their fax # as [phone].  Faxed information needed (lab forms for specific labs needed) and note per Almarie Ferrari NP.  Received confirmation fax that it was sent successfully.

## 2024-08-20 NOTE — Telephone Encounter (Signed)
 Yes fine, as long as they fax us  the results. She needs to make a visit with them as soon as possible Concern for active wegner's vasculitis - UA showed protein and blood in her urine and CT sinuses with worsening sinusitis  May need to resume rituximab 

## 2024-08-20 NOTE — Telephone Encounter (Signed)
ATC x1.  LVM to return call. 

## 2024-08-21 ENCOUNTER — Telehealth: Payer: Self-pay | Admitting: Internal Medicine

## 2024-08-21 ENCOUNTER — Encounter (INDEPENDENT_AMBULATORY_CARE_PROVIDER_SITE_OTHER): Payer: Self-pay

## 2024-08-21 ENCOUNTER — Telehealth: Payer: Self-pay

## 2024-08-21 DIAGNOSIS — J329 Chronic sinusitis, unspecified: Secondary | ICD-10-CM

## 2024-08-21 NOTE — Telephone Encounter (Signed)
 Copied from CRM #[id number]. Topic: Referral - Question >> Aug 21, 2024  9:18 AM Corean SAUNDERS wrote: Reason for CRM: Patient states she accidentally told the ENT office that Dr. Geronimo referred her to that she didn't want to schedule and appointment but she didn't realize that it was ENT who was calling. Patient is requesting the doctor to send the referral again as the previous one was closed.

## 2024-08-21 NOTE — Telephone Encounter (Signed)
 New referral placed.Patient aware.NFN

## 2024-08-21 NOTE — Telephone Encounter (Signed)
 Copied from CRM #[id number]. Topic: General - Other >> Aug 20, 2024  9:58 AM Russell PARAS wrote: Reason for CRM:   Pt returning call from [facility]. Contacted CAL x 3, no answer  Requested call back   CB#  [phone]  Spoke with patient.NFN

## 2024-09-02 NOTE — Telephone Encounter (Signed)
 Beth  Probably worth you calling Dr Norine directly and expressing our concern that sinusitiis is probably from vasculitis and she might need Rituxan  At some point if Rituxan  and ENT consult does not address the issue, we can try Nucala (if EOS >= 200)

## 2024-09-04 ENCOUNTER — Encounter (HOSPITAL_COMMUNITY)

## 2024-09-08 ENCOUNTER — Telehealth: Payer: Self-pay

## 2024-09-08 DIAGNOSIS — M3131 Wegener's granulomatosis with renal involvement: Secondary | ICD-10-CM

## 2024-09-08 NOTE — Telephone Encounter (Signed)
 Patient followed by nephrology for ESRD and wegner's vasculitis. Protein and blood noted on recent UA along with worsening sinusitis on imaging. Dr. Geronimo concerned for active vasculitis. Spoke with Dr. Manuelita Barters, she saw patient yesterday and patient did not have any significant sinusitis complaints. She would like ANCA panel checked, if abnormal she would like to be notified and she will re-dose rituxan .   Triage can we please call and see if patient can get ANCA panel added on to lab draw on January 7th with short stay clinic at [facility] prior to epogen  infusion.   She has been referred to ENT as well.

## 2024-09-08 NOTE — Telephone Encounter (Signed)
Thank you Danelle Earthly!

## 2024-09-08 NOTE — Telephone Encounter (Signed)
 I called and spoke with the switch board operator and was given Dr. Kruska cell number [phone]. I was transferred to Dr. Norine nurse and there was no answer so I left a vm asking for Dr. Norine to call us  at [phone] and request to speak with Landry, NP or Dr. Geronimo regarding the pt.

## 2024-09-08 NOTE — Telephone Encounter (Signed)
 Called pt and there was no answer- LMTCB   I went ahead and placed ANCA lab order

## 2024-09-08 NOTE — Telephone Encounter (Signed)
 Copied from CRM [id number]. Topic: Clinical - Medical Advice >> Sep 08, 2024 11:05 AM Rilla NOVAK wrote: Reason for CRM:  Dr. Norine returning call to [facility]. No answer at CAL (x3) Please return call to Dr Norine on cell number [phone].   Forwarding to Landry Ferrari, NP at her request.

## 2024-09-08 NOTE — Telephone Encounter (Signed)
 I tried calling [facility] physician line and got a busy tone x3  Can we called Dr. Norine and ask that she return call to speak with [facility] or Dr. Geronimo regarding this patient

## 2024-09-11 ENCOUNTER — Telehealth (HOSPITAL_COMMUNITY): Payer: Self-pay

## 2024-09-11 NOTE — Telephone Encounter (Signed)
 Auth Submission: NO AUTH NEEDED Site of care: Site of care: [facility] Payer: [employer] Dual Medication & CPT/J Code(s) submitted: Aranesp  ([id number]) Diagnosis Code: N18.5/D63.1 Route of submission (phone, fax, portal): portal Phone # Fax # Auth type: Buy/Bill HB Units/visits requested: 200mcg q2weeks Reference number: [id number] Approval from: 09/10/24 to 09/09/25

## 2024-09-16 ENCOUNTER — Inpatient Hospital Stay (HOSPITAL_COMMUNITY): Admission: RE | Admit: 2024-09-16

## 2024-09-17 NOTE — Telephone Encounter (Signed)
 Patient in office today for appointment.  Nothing further needed.

## 2024-09-23 ENCOUNTER — Other Ambulatory Visit: Payer: Self-pay

## 2024-09-23 ENCOUNTER — Encounter (HOSPITAL_COMMUNITY): Payer: Self-pay

## 2024-09-23 ENCOUNTER — Emergency Department (HOSPITAL_COMMUNITY)
Admission: EM | Admit: 2024-09-23 | Discharge: 2024-09-23 | Discharge status: Against Medical Advice | Source: Ambulatory Visit | Attending: Emergency Medicine | Admitting: Emergency Medicine

## 2024-09-23 ENCOUNTER — Ambulatory Visit (HOSPITAL_COMMUNITY)
Admission: RE | Admit: 2024-09-23 | Discharge: 2024-09-23 | Disposition: A | Discharge status: Home / Self Care | Source: Ambulatory Visit | Attending: Internal Medicine | Admitting: Internal Medicine

## 2024-09-23 VITALS — BP 155/66 | HR 94 | Temp 97.0°F | Resp 20

## 2024-09-23 DIAGNOSIS — E1122 Type 2 diabetes mellitus with diabetic chronic kidney disease: Secondary | ICD-10-CM | POA: Insufficient documentation

## 2024-09-23 DIAGNOSIS — Z794 Long term (current) use of insulin: Secondary | ICD-10-CM | POA: Insufficient documentation

## 2024-09-23 DIAGNOSIS — R944 Abnormal results of kidney function studies: Secondary | ICD-10-CM | POA: Insufficient documentation

## 2024-09-23 DIAGNOSIS — D631 Anemia in chronic kidney disease: Secondary | ICD-10-CM | POA: Insufficient documentation

## 2024-09-23 DIAGNOSIS — N185 Chronic kidney disease, stage 5: Secondary | ICD-10-CM | POA: Insufficient documentation

## 2024-09-23 DIAGNOSIS — D649 Anemia, unspecified: Secondary | ICD-10-CM | POA: Insufficient documentation

## 2024-09-23 DIAGNOSIS — Z5329 Procedure and treatment not carried out because of patient's decision for other reasons: Secondary | ICD-10-CM | POA: Insufficient documentation

## 2024-09-23 DIAGNOSIS — J4489 Other specified chronic obstructive pulmonary disease: Secondary | ICD-10-CM | POA: Insufficient documentation

## 2024-09-23 DIAGNOSIS — Z79899 Other long term (current) drug therapy: Secondary | ICD-10-CM | POA: Insufficient documentation

## 2024-09-23 DIAGNOSIS — N189 Chronic kidney disease, unspecified: Secondary | ICD-10-CM | POA: Insufficient documentation

## 2024-09-23 DIAGNOSIS — Z7982 Long term (current) use of aspirin: Secondary | ICD-10-CM | POA: Insufficient documentation

## 2024-09-23 DIAGNOSIS — I129 Hypertensive chronic kidney disease with stage 1 through stage 4 chronic kidney disease, or unspecified chronic kidney disease: Secondary | ICD-10-CM | POA: Insufficient documentation

## 2024-09-23 LAB — I-STAT CHEM 8, ED
BUN: >130 mg/dL — ABNORMAL HIGH (ref 8–23)
Calcium, Ion: 0.86 mmol/L — CL (ref 1.15–1.40)
Chloride: 99 mmol/L (ref 98–111)
Creatinine, Ser: 16 mg/dL — ABNORMAL HIGH (ref 0.44–1.00)
Glucose, Bld: 95 mg/dL (ref 70–99)
HCT: 23 % — ABNORMAL LOW (ref 36.0–46.0)
Hemoglobin: 7.8 g/dL — ABNORMAL LOW (ref 12.0–15.0)
Potassium: 4 mmol/L (ref 3.5–5.1)
Sodium: 139 mmol/L (ref 135–145)
TCO2: 20 mmol/L — ABNORMAL LOW (ref 22–32)

## 2024-09-23 LAB — COMPREHENSIVE METABOLIC PANEL WITH GFR
ALT: 54 U/L — ABNORMAL HIGH (ref 0–44)
AST: 32 U/L (ref 15–41)
Albumin: 3.8 g/dL (ref 3.5–5.0)
Alkaline Phosphatase: 102 U/L (ref 38–126)
Anion gap: 24 — ABNORMAL HIGH (ref 5–15)
BUN: 145 mg/dL — ABNORMAL HIGH (ref 8–23)
CO2: 18 mmol/L — ABNORMAL LOW (ref 22–32)
Calcium: 7.3 mg/dL — ABNORMAL LOW (ref 8.9–10.3)
Chloride: 98 mmol/L (ref 98–111)
Creatinine, Ser: 14.9 mg/dL — ABNORMAL HIGH (ref 0.44–1.00)
GFR, Estimated: 2 mL/min — ABNORMAL LOW
Glucose, Bld: 98 mg/dL (ref 70–99)
Potassium: 4.1 mmol/L (ref 3.5–5.1)
Sodium: 140 mmol/L (ref 135–145)
Total Bilirubin: 0.3 mg/dL (ref 0.0–1.2)
Total Protein: 6.8 g/dL (ref 6.5–8.1)

## 2024-09-23 LAB — FERRITIN: Ferritin: 737 ng/mL — ABNORMAL HIGH (ref 11–307)

## 2024-09-23 LAB — CBC WITH DIFFERENTIAL/PLATELET
Abs Immature Granulocytes: 0.05 K/uL (ref 0.00–0.07)
Basophils Absolute: 0 K/uL (ref 0.0–0.1)
Basophils Relative: 0 %
Eosinophils Absolute: 0.2 K/uL (ref 0.0–0.5)
Eosinophils Relative: 2 %
HCT: 20.4 % — ABNORMAL LOW (ref 36.0–46.0)
Hemoglobin: 6.4 g/dL — CL (ref 12.0–15.0)
Immature Granulocytes: 1 %
Lymphocytes Relative: 15 %
Lymphs Abs: 1.3 K/uL (ref 0.7–4.0)
MCH: 25.5 pg — ABNORMAL LOW (ref 26.0–34.0)
MCHC: 31.4 g/dL (ref 30.0–36.0)
MCV: 81.3 fL (ref 80.0–100.0)
Monocytes Absolute: 0.7 K/uL (ref 0.1–1.0)
Monocytes Relative: 8 %
Neutro Abs: 6.4 K/uL (ref 1.7–7.7)
Neutrophils Relative %: 74 %
Platelets: 143 K/uL — ABNORMAL LOW (ref 150–400)
RBC: 2.51 MIL/uL — ABNORMAL LOW (ref 3.87–5.11)
RDW: 16.4 % — ABNORMAL HIGH (ref 11.5–15.5)
WBC: 8.6 K/uL (ref 4.0–10.5)
nRBC: 0 % (ref 0.0–0.2)

## 2024-09-23 LAB — PROTIME-INR
INR: 1 (ref 0.8–1.2)
Prothrombin Time: 13.6 s (ref 11.4–15.2)

## 2024-09-23 LAB — IRON AND TIBC
Iron: 98 ug/dL (ref 28–170)
Saturation Ratios: 43 % — ABNORMAL HIGH (ref 10.4–31.8)
TIBC: 231 ug/dL — ABNORMAL LOW (ref 250–450)
UIBC: 133 ug/dL

## 2024-09-23 LAB — POCT HEMOGLOBIN-HEMACUE: Hemoglobin: 6.8 g/dL — CL (ref 12.0–15.0)

## 2024-09-23 LAB — PREPARE RBC (CROSSMATCH)

## 2024-09-23 MED ORDER — SODIUM CHLORIDE 0.9% IV SOLUTION: Freq: Once | INTRAVENOUS | Status: DC

## 2024-09-23 MED ORDER — DARBEPOETIN ALFA 200 MCG/0.4ML IJ SOSY
200.0000 ug | PREFILLED_SYRINGE | Freq: Once | INTRAMUSCULAR | Status: AC
  Administered 2024-09-23: 200 ug via SUBCUTANEOUS

## 2024-09-23 MED ORDER — DARBEPOETIN ALFA 200 MCG/0.4ML IJ SOSY
PREFILLED_SYRINGE | INTRAMUSCULAR | Status: AC
  Filled 2024-09-23: qty 0.4

## 2024-09-23 NOTE — ED Provider Triage Note (Signed)
 Emergency Medicine Provider Triage Evaluation Note  Catherine Aguilar , a 69 y.o. female  was evaluated in triage.  Pt complains of low hemoglobin.  Sent from infusion center for hemoglobin of 6.8.  She does get erythropoietin infusions for chronic anemia but hemoglobin normal and not as low.  States fatigue and weakness is at baseline.  No increased shortness of breath.  She is not on blood thinners.  Denies blood in the stool or hematemesis.  Review of Systems  Positive: Fatigue, weakness Negative: Headache, dizziness, syncope, chest pain, shortness of breath  Physical Exam  BP (!) 157/65   Pulse 63   Temp 98.1 F (36.7 C)   Resp 17   SpO2 90%  Gen:   Awake, no distress   Resp:  Normal effort  MSK:   Moves extremities without difficulty  Other:    Medical Decision Making  Medically screening exam initiated at 1:15 PM.  Appropriate orders placed.  Catherine Aguilar was informed that the remainder of the evaluation will be completed by another provider, this initial triage assessment does not replace that evaluation, and the importance of remaining in the ED until their evaluation is complete.     Catherine Thersia RAMAN, PA-C 09/23/24 1316

## 2024-09-23 NOTE — Progress Notes (Signed)
 Notiied Gerrie at [facility] who stated Dr. Norine said we should give Aranesp  inj as ordered and patient can go to ED for a blood tranfusion if she will. Patient agreed and was transported via [facility] and the to ED.

## 2024-09-23 NOTE — ED Notes (Signed)
 Pt ambulated to bathroom with assistance.

## 2024-09-23 NOTE — ED Notes (Signed)
 Pt began having coughing and SHOB while this nurse observing her for 15 mins after blood administration. MD notified. Pt felt like this was from phlegm in her throat. MD came to bedside and assessed pt.

## 2024-09-23 NOTE — ED Notes (Signed)
 This RN went to check on pt and pt is no longer in room. Staff did not see pt leave. Dr. Ginger at bedside and aware pt has eloped.

## 2024-09-23 NOTE — ED Provider Notes (Signed)
 " [place] EMERGENCY DEPARTMENT AT [facility] Provider Note   CSN: [id number] Arrival date & time: 09/23/24  1255     Patient presents with: Abnormal Lab (Hgb 6.8)   Catherine Aguilar is a 69 y.o. female.   The history is provided by the patient and medical records. No language interpreter was used.  Abnormal Lab Time since result:  Low hgb Patient referred by:  PCP Resulting agency:  Internal      Prior to Admission medications  Medication Sig Start Date End Date Taking? Authorizing Provider  albuterol  (PROVENTIL ) (2.5 MG/3ML) 0.083% nebulizer solution Take 3 mLs (2.5 mg total) by nebulization every 6 (six) hours as needed for wheezing or shortness of breath. Patient not taking: Reported on 08/17/2024 11/06/20   Lenon Chiquita BROCKS, DO  albuterol  (VENTOLIN  HFA) 108 (90 Base) MCG/ACT inhaler INHALE 2 PUFFS BY MOUTH EVERY 4-6 HOURS Patient taking differently: Inhale 2 puffs into the lungs See admin instructions. INHALE 2 PUFFS INTO THE LUNGS EVERY 4-6 HOURS 06/29/24   Hindel, Rea, MD  amLODipine  (NORVASC ) 10 MG tablet Take 10 mg by mouth at bedtime. 12/06/23   [provider]  aspirin  81 MG EC tablet TAKE 1 TABLET BY MOUTH EVERY DAY Patient taking differently: Take 81 mg by mouth daily. 08/09/20   Lenon Chiquita BROCKS, DO  azelastine  (ASTELIN ) 0.1 % nasal spray Place 2 sprays into both nostrils 2 (two) times daily. Use in each nostril as directed Patient taking differently: Place 2 sprays into both nostrils 2 (two) times daily as needed for rhinitis or allergies. 06/26/24   [name], MD  B-D UF III MINI PEN NEEDLES 31G X 5 MM MISC USE AS DIRECTED 12/16/23   Christia Budds, MD  benzonatate  (TESSALON ) 200 MG capsule Take 1 capsule (200 mg total) by mouth 3 (three) times daily as needed for cough. Patient not taking: Reported on 08/17/2024 08/14/24   Hope Almarie ORN, NP  budesonide-glycopyrrolate-formoterol (BREZTRI  AEROSPHERE) 160-9-4.8 MCG/ACT AERO inhaler  Inhale 2 puffs into the lungs in the morning and at bedtime. Patient not taking: Reported on 08/17/2024 08/14/24   Hope Almarie ORN, NP  doxazosin  (CARDURA ) 2 MG tablet Take 2 mg by mouth in the morning and at bedtime. 10/31/23   [provider]  fluticasone  (FLONASE ) 50 MCG/ACT nasal spray Place 2 sprays into both nostrils daily. Patient not taking: Reported on 08/17/2024 09/14/19   Hope Almarie ORN, NP  glucose blood (ACCU-CHEK GUIDE) test strip Check blood glucose prior to lantus  administration 04/18/23   Jagadish, Mayuri, MD  guaiFENesin  (ROBITUSSIN) 100 MG/5ML liquid Take 5 mLs by mouth every 4 (four) hours as needed for cough or to loosen phlegm. Patient not taking: No sig reported 12/12/23   Tharon Lung, MD  insulin  glargine (LANTUS  SOLOSTAR) 100 UNIT/ML Solostar Pen Inject 5 Units into the skin at bedtime. 03/11/24   Adele Rea, MD  labetalol  (NORMODYNE ) 300 MG tablet Take 300 mg by mouth in the morning and at bedtime. 12/06/23   [provider]  Lancets Misc. (ACCU-CHEK SOFTCLIX LANCET DEV) KIT Check BG prior to administering insulin  04/18/23   Christia Budds, MD  pravastatin  (PRAVACHOL ) 40 MG tablet Take 40 mg by mouth at bedtime. 05/09/24   [provider]  sodium bicarbonate  650 MG tablet Take 2 tablets (1,300 mg total) by mouth 2 (two) times daily. 11/06/20   Lenon Chiquita BROCKS, DO  Spacer/Aero-Holding Raguel DEVI Please use with all inhaled medications 07/15/24   Adele Rea, MD  torsemide (  DEMADEX) 100 MG tablet Take 100 mg by mouth in the morning. 04/16/24   [provider]    Allergies: Patient has no known allergies.    Review of Systems  Constitutional:  Positive for fatigue (unahcnged from baseline). Negative for chills and fever.  HENT:  Negative for congestion.   Respiratory:  Positive for shortness of breath (chronic and unchanged). Negative for cough and chest tightness.   Cardiovascular:  Negative for chest pain.  Gastrointestinal:   Negative for abdominal pain, constipation, diarrhea, nausea and vomiting.  Genitourinary:  Negative for dysuria.  Musculoskeletal:  Negative for back pain, neck pain and neck stiffness.  Skin:  Negative for rash and wound.  Neurological:  Negative for light-headedness and headaches.  Psychiatric/Behavioral:  Negative for agitation and confusion.   All other systems reviewed and are negative.   Updated Vital Signs BP (!) 157/65   Pulse 63   Temp 98.1 F (36.7 C)   Resp 17   SpO2 90%   Physical Exam Vitals and nursing note reviewed.  Constitutional:      General: She is not in acute distress.    Appearance: She is well-developed. She is not ill-appearing, toxic-appearing or diaphoretic.  HENT:     Head: Normocephalic and atraumatic.     Nose: No congestion or rhinorrhea.  Eyes:     Extraocular Movements: Extraocular movements intact.     Conjunctiva/sclera: Conjunctivae normal.     Pupils: Pupils are equal, round, and reactive to light.  Cardiovascular:     Rate and Rhythm: Normal rate and regular rhythm.     Heart sounds: No murmur heard. Pulmonary:     Effort: Pulmonary effort is normal. No respiratory distress.     Breath sounds: Normal breath sounds. No wheezing, rhonchi or rales.  Chest:     Chest wall: No tenderness.  Abdominal:     General: Abdomen is flat.     Palpations: Abdomen is soft.     Tenderness: There is no abdominal tenderness.  Musculoskeletal:        General: No swelling or tenderness.     Cervical back: Neck supple.  Skin:    General: Skin is warm and dry.     Capillary Refill: Capillary refill takes less than 2 seconds.     Findings: No erythema or rash.  Neurological:     Mental Status: She is alert.  Psychiatric:        Mood and Affect: Mood normal.     (all labs ordered are listed, but only abnormal results are displayed) Labs Reviewed  COMPREHENSIVE METABOLIC PANEL WITH GFR - Abnormal; Notable for the following components:      Result  Value   CO2 18 (*)    BUN 145 (*)    Creatinine, Ser 14.90 (*)    Calcium  7.3 (*)    ALT 54 (*)    GFR, Estimated 2 (*)    Anion gap 24 (*)    All other components within normal limits  CBC WITH DIFFERENTIAL/PLATELET - Abnormal; Notable for the following components:   RBC 2.51 (*)    Hemoglobin 6.4 (*)    HCT 20.4 (*)    MCH 25.5 (*)    RDW 16.4 (*)    Platelets 143 (*)    All other components within normal limits  I-STAT CHEM 8, ED - Abnormal; Notable for the following components:   BUN >130 (*)    Creatinine, Ser 16.00 (*)    Calcium , Ion  0.86 (*)    TCO2 20 (*)    Hemoglobin 7.8 (*)    HCT 23.0 (*)    All other components within normal limits  PROTIME-INR  TYPE AND SCREEN  PREPARE RBC (CROSSMATCH)    EKG: EKG Interpretation Date/Time:  Wednesday September 23 2024 13:21:37 EST Ventricular Rate:  58 PR Interval:  150 QRS Duration:  94 QT Interval:  486 QTC Calculation: 477 R Axis:   -40  Text Interpretation: Sinus bradycardia Possible Left atrial enlargement Left axis deviation Left ventricular hypertrophy ( R in aVL , Cornell product ) Abnormal ECG When compared with ECG of 17-Aug-2024 00:01, PREVIOUS ECG IS PRESENT when compared to prior, similar appearance No STEMI Confirmed by Ginger Barefoot [phone]) on 09/23/2024 6:41:14 PM  Radiology: No results found.   Procedures   Medications Ordered in the ED  0.9 %  sodium chloride  infusion (Manually program via Guardrails IV Fluids) (0 mLs Intravenous Hold 09/23/24 1555)                                    Medical Decision Making   Catherine Aguilar is a 69 y.o. female with a past medical history significant for hypertension, hyperlipidemia, previous stroke, CKD, diabetes, asthma, COPD, and previous hysterectomy who was sent from her infusion center to get the blood transfusion.  Patient says that she frequently has anemia and has chronic fatigue and exertional shortness of breath.  She reports it does not feel much  worse than normal but today she had a hemoglobin was found to be 6.8 and her transfusion goal is 7 so she was sent here to get 1 unit of blood and then go home.  She otherwise specifically denies any chest pain, fevers, chills, congestion, cough, nausea, vomiting, constipation, diarrhea, or urinary changes.  She denies any vomiting blood or having any bloody bowel movements.  She says she just wants to getting her blood and then go home.  On exam, lungs clear.  Chest nontender.  Abdomen nontender.  Patient resting comfortably without distress.  Vital signs reassuring initially.  Patient was screened in triage and had some workup ordered including 1 unit of blood.    Hemoglobin was found to be 6.4 here so she will get a unit of blood.     Her metabolic panel showed a normal potassium but her creatinine is elevated at 14.9 this does appear to be worse than her baseline.  She will get transfusion of blood and we will reassess to determine if she is safe to go home or not.  When they started the blood transfusion she developed a dry mouth and started gagging but says she is feeling better now and denies any reaction.  She denies any shortness of breath, wheezing worse than baseline, or any allergic reaction symptoms.  We will continue the blood transfusion and anticipate reassessment to determine disposition after it is completed.  9:35 PM Patient completed her blood transfusion after it was slow down and when I went to check on her she appears to have left the emergency department.  Will give her a little bit longer to make sure she does not return but otherwise given her lack of symptoms and resolution of elevated blood pressure per the last documented blood pressure and her lack of other symptoms, anticipate she left after transfusion to follow-up with her outpatient team.    Suspect she stable eloped after completion  of her blood transfusion.  Patient still has not returned to room, suspect she  eloped after her blood transfusion was completed and she had no other complaints.  Anticipate PCP follow-up with her outpatient team.  She appears to have eloped in stable condition.     Final diagnoses:  Anemia, unspecified type    Clinical Impression: 1. Anemia, unspecified type     Disposition: Eloped   New Prescriptions   No medications on file    Follow Up: No follow-up provider specified.     Evyn Kooyman, Lonni PARAS, MD 09/23/24 2322  "

## 2024-09-23 NOTE — ED Triage Notes (Signed)
 Pt arrives via POV. Pt report she had labs drawn at the infusion center today and her HGB was 6.8. Pt is AxOx4. Reports some associated dizziness. Denies blood thinners and denies any obvious source of bleeding.

## 2024-09-24 LAB — TYPE AND SCREEN
ABO/RH(D): A POS
Antibody Screen: NEGATIVE
Unit division: 0

## 2024-09-24 LAB — BPAM RBC
Blood Product Expiration Date: 202601312359
ISSUE DATE / TIME: 202601141542
Unit Type and Rh: 6200

## 2024-09-25 ENCOUNTER — Other Ambulatory Visit: Payer: Self-pay | Admitting: Primary Care

## 2024-09-25 ENCOUNTER — Inpatient Hospital Stay (HOSPITAL_COMMUNITY)
Admission: EM | Admit: 2024-09-25 | Discharge: 2024-10-08 | DRG: 264 | Disposition: A | Discharge status: Home / Self Care | Attending: Family Medicine | Admitting: Family Medicine

## 2024-09-25 ENCOUNTER — Encounter (HOSPITAL_COMMUNITY): Payer: Self-pay

## 2024-09-25 ENCOUNTER — Emergency Department (HOSPITAL_COMMUNITY)

## 2024-09-25 ENCOUNTER — Institutional Professional Consult (permissible substitution) (INDEPENDENT_AMBULATORY_CARE_PROVIDER_SITE_OTHER)

## 2024-09-25 ENCOUNTER — Other Ambulatory Visit: Payer: Self-pay

## 2024-09-25 DIAGNOSIS — Z794 Long term (current) use of insulin: Secondary | ICD-10-CM

## 2024-09-25 DIAGNOSIS — I82890 Acute embolism and thrombosis of other specified veins: Secondary | ICD-10-CM | POA: Insufficient documentation

## 2024-09-25 DIAGNOSIS — Z823 Family history of stroke: Secondary | ICD-10-CM

## 2024-09-25 DIAGNOSIS — T80211A Bloodstream infection due to central venous catheter, initial encounter: Secondary | ICD-10-CM | POA: Diagnosis not present

## 2024-09-25 DIAGNOSIS — R7989 Other specified abnormal findings of blood chemistry: Secondary | ICD-10-CM | POA: Insufficient documentation

## 2024-09-25 DIAGNOSIS — J440 Chronic obstructive pulmonary disease with acute lower respiratory infection: Secondary | ICD-10-CM | POA: Diagnosis present

## 2024-09-25 DIAGNOSIS — R1319 Other dysphagia: Secondary | ICD-10-CM

## 2024-09-25 DIAGNOSIS — K2970 Gastritis, unspecified, without bleeding: Secondary | ICD-10-CM | POA: Diagnosis present

## 2024-09-25 DIAGNOSIS — J441 Chronic obstructive pulmonary disease with (acute) exacerbation: Secondary | ICD-10-CM | POA: Diagnosis present

## 2024-09-25 DIAGNOSIS — K219 Gastro-esophageal reflux disease without esophagitis: Secondary | ICD-10-CM | POA: Diagnosis present

## 2024-09-25 DIAGNOSIS — I82891 Chronic embolism and thrombosis of other specified veins: Secondary | ICD-10-CM | POA: Diagnosis present

## 2024-09-25 DIAGNOSIS — J449 Chronic obstructive pulmonary disease, unspecified: Secondary | ICD-10-CM | POA: Diagnosis present

## 2024-09-25 DIAGNOSIS — Z992 Dependence on renal dialysis: Secondary | ICD-10-CM

## 2024-09-25 DIAGNOSIS — Z8673 Personal history of transient ischemic attack (TIA), and cerebral infarction without residual deficits: Secondary | ICD-10-CM

## 2024-09-25 DIAGNOSIS — J383 Other diseases of vocal cords: Secondary | ICD-10-CM | POA: Diagnosis present

## 2024-09-25 DIAGNOSIS — R0602 Shortness of breath: Secondary | ICD-10-CM

## 2024-09-25 DIAGNOSIS — K3189 Other diseases of stomach and duodenum: Secondary | ICD-10-CM | POA: Diagnosis present

## 2024-09-25 DIAGNOSIS — B9562 Methicillin resistant Staphylococcus aureus infection as the cause of diseases classified elsewhere: Secondary | ICD-10-CM | POA: Diagnosis not present

## 2024-09-25 DIAGNOSIS — Z860101 Personal history of adenomatous and serrated colon polyps: Secondary | ICD-10-CM

## 2024-09-25 DIAGNOSIS — I132 Hypertensive heart and chronic kidney disease with heart failure and with stage 5 chronic kidney disease, or end stage renal disease: Principal | ICD-10-CM | POA: Diagnosis present

## 2024-09-25 DIAGNOSIS — D631 Anemia in chronic kidney disease: Secondary | ICD-10-CM | POA: Diagnosis present

## 2024-09-25 DIAGNOSIS — J188 Other pneumonia, unspecified organism: Secondary | ICD-10-CM | POA: Insufficient documentation

## 2024-09-25 DIAGNOSIS — Z79899 Other long term (current) drug therapy: Secondary | ICD-10-CM

## 2024-09-25 DIAGNOSIS — N185 Chronic kidney disease, stage 5: Secondary | ICD-10-CM | POA: Diagnosis present

## 2024-09-25 DIAGNOSIS — Z6828 Body mass index (BMI) 28.0-28.9, adult: Secondary | ICD-10-CM

## 2024-09-25 DIAGNOSIS — K449 Diaphragmatic hernia without obstruction or gangrene: Secondary | ICD-10-CM | POA: Diagnosis present

## 2024-09-25 DIAGNOSIS — E872 Acidosis, unspecified: Secondary | ICD-10-CM | POA: Diagnosis present

## 2024-09-25 DIAGNOSIS — I2510 Atherosclerotic heart disease of native coronary artery without angina pectoris: Secondary | ICD-10-CM | POA: Diagnosis present

## 2024-09-25 DIAGNOSIS — E119 Type 2 diabetes mellitus without complications: Secondary | ICD-10-CM

## 2024-09-25 DIAGNOSIS — I2489 Other forms of acute ischemic heart disease: Secondary | ICD-10-CM | POA: Diagnosis present

## 2024-09-25 DIAGNOSIS — Z833 Family history of diabetes mellitus: Secondary | ICD-10-CM

## 2024-09-25 DIAGNOSIS — I82812 Embolism and thrombosis of superficial veins of left lower extremities: Secondary | ICD-10-CM | POA: Diagnosis present

## 2024-09-25 DIAGNOSIS — K222 Esophageal obstruction: Secondary | ICD-10-CM | POA: Diagnosis present

## 2024-09-25 DIAGNOSIS — Z91148 Patient's other noncompliance with medication regimen for other reason: Secondary | ICD-10-CM

## 2024-09-25 DIAGNOSIS — N2581 Secondary hyperparathyroidism of renal origin: Secondary | ICD-10-CM | POA: Diagnosis present

## 2024-09-25 DIAGNOSIS — K2289 Other specified disease of esophagus: Secondary | ICD-10-CM | POA: Diagnosis present

## 2024-09-25 DIAGNOSIS — M3131 Wegener's granulomatosis with renal involvement: Secondary | ICD-10-CM | POA: Diagnosis present

## 2024-09-25 DIAGNOSIS — E1122 Type 2 diabetes mellitus with diabetic chronic kidney disease: Secondary | ICD-10-CM | POA: Diagnosis present

## 2024-09-25 DIAGNOSIS — Q399 Congenital malformation of esophagus, unspecified: Secondary | ICD-10-CM

## 2024-09-25 DIAGNOSIS — I509 Heart failure, unspecified: Secondary | ICD-10-CM

## 2024-09-25 DIAGNOSIS — F419 Anxiety disorder, unspecified: Secondary | ICD-10-CM | POA: Diagnosis present

## 2024-09-25 DIAGNOSIS — Z1152 Encounter for screening for COVID-19: Secondary | ICD-10-CM

## 2024-09-25 DIAGNOSIS — Z7982 Long term (current) use of aspirin: Secondary | ICD-10-CM

## 2024-09-25 DIAGNOSIS — I10 Essential (primary) hypertension: Secondary | ICD-10-CM | POA: Diagnosis present

## 2024-09-25 DIAGNOSIS — E7800 Pure hypercholesterolemia, unspecified: Secondary | ICD-10-CM | POA: Diagnosis present

## 2024-09-25 DIAGNOSIS — I700 Atherosclerosis of aorta: Secondary | ICD-10-CM | POA: Diagnosis present

## 2024-09-25 DIAGNOSIS — R933 Abnormal findings on diagnostic imaging of other parts of digestive tract: Secondary | ICD-10-CM

## 2024-09-25 DIAGNOSIS — I5031 Acute diastolic (congestive) heart failure: Secondary | ICD-10-CM | POA: Diagnosis present

## 2024-09-25 DIAGNOSIS — Z87891 Personal history of nicotine dependence: Secondary | ICD-10-CM

## 2024-09-25 DIAGNOSIS — Y848 Other medical procedures as the cause of abnormal reaction of the patient, or of later complication, without mention of misadventure at the time of the procedure: Secondary | ICD-10-CM | POA: Diagnosis present

## 2024-09-25 DIAGNOSIS — R7881 Bacteremia: Secondary | ICD-10-CM | POA: Diagnosis not present

## 2024-09-25 DIAGNOSIS — Z811 Family history of alcohol abuse and dependence: Secondary | ICD-10-CM

## 2024-09-25 DIAGNOSIS — Z8249 Family history of ischemic heart disease and other diseases of the circulatory system: Secondary | ICD-10-CM

## 2024-09-25 DIAGNOSIS — I1310 Hypertensive heart and chronic kidney disease without heart failure, with stage 1 through stage 4 chronic kidney disease, or unspecified chronic kidney disease: Secondary | ICD-10-CM | POA: Insufficient documentation

## 2024-09-25 DIAGNOSIS — J690 Pneumonitis due to inhalation of food and vomit: Secondary | ICD-10-CM | POA: Diagnosis present

## 2024-09-25 DIAGNOSIS — N179 Acute kidney failure, unspecified: Secondary | ICD-10-CM | POA: Diagnosis present

## 2024-09-25 DIAGNOSIS — N184 Chronic kidney disease, stage 4 (severe): Secondary | ICD-10-CM | POA: Diagnosis present

## 2024-09-25 DIAGNOSIS — N186 End stage renal disease: Secondary | ICD-10-CM | POA: Diagnosis present

## 2024-09-25 DIAGNOSIS — T501X6A Underdosing of loop [high-ceiling] diuretics, initial encounter: Secondary | ICD-10-CM | POA: Diagnosis present

## 2024-09-25 DIAGNOSIS — E662 Morbid (severe) obesity with alveolar hypoventilation: Secondary | ICD-10-CM | POA: Diagnosis present

## 2024-09-25 DIAGNOSIS — E8770 Fluid overload, unspecified: Principal | ICD-10-CM | POA: Diagnosis present

## 2024-09-25 DIAGNOSIS — J9601 Acute respiratory failure with hypoxia: Secondary | ICD-10-CM | POA: Diagnosis present

## 2024-09-25 DIAGNOSIS — J3801 Paralysis of vocal cords and larynx, unilateral: Secondary | ICD-10-CM | POA: Insufficient documentation

## 2024-09-25 DIAGNOSIS — Z90710 Acquired absence of both cervix and uterus: Secondary | ICD-10-CM

## 2024-09-25 DIAGNOSIS — Z5309 Procedure and treatment not carried out because of other contraindication: Secondary | ICD-10-CM | POA: Diagnosis not present

## 2024-09-25 DIAGNOSIS — J3802 Paralysis of vocal cords and larynx, bilateral: Secondary | ICD-10-CM | POA: Diagnosis present

## 2024-09-25 DIAGNOSIS — G9349 Other encephalopathy: Secondary | ICD-10-CM | POA: Diagnosis not present

## 2024-09-25 DIAGNOSIS — Z789 Other specified health status: Secondary | ICD-10-CM

## 2024-09-25 DIAGNOSIS — R1314 Dysphagia, pharyngoesophageal phase: Secondary | ICD-10-CM | POA: Diagnosis present

## 2024-09-25 LAB — RESP PANEL BY RT-PCR (RSV, FLU A&B, COVID)  RVPGX2
Influenza A by PCR: NEGATIVE
Influenza B by PCR: NEGATIVE
Resp Syncytial Virus by PCR: NEGATIVE
SARS Coronavirus 2 by RT PCR: NEGATIVE

## 2024-09-25 LAB — CBC WITH DIFFERENTIAL/PLATELET
Abs Immature Granulocytes: 0.11 K/uL — ABNORMAL HIGH (ref 0.00–0.07)
Basophils Absolute: 0.1 K/uL (ref 0.0–0.1)
Basophils Relative: 1 %
Eosinophils Absolute: 0.2 K/uL (ref 0.0–0.5)
Eosinophils Relative: 2 %
HCT: 24.8 % — ABNORMAL LOW (ref 36.0–46.0)
Hemoglobin: 8 g/dL — ABNORMAL LOW (ref 12.0–15.0)
Immature Granulocytes: 1 %
Lymphocytes Relative: 13 %
Lymphs Abs: 1.5 K/uL (ref 0.7–4.0)
MCH: 26.1 pg (ref 26.0–34.0)
MCHC: 32.3 g/dL (ref 30.0–36.0)
MCV: 80.8 fL (ref 80.0–100.0)
Monocytes Absolute: 0.9 K/uL (ref 0.1–1.0)
Monocytes Relative: 7 %
Neutro Abs: 9.3 K/uL — ABNORMAL HIGH (ref 1.7–7.7)
Neutrophils Relative %: 76 %
Platelets: 151 K/uL (ref 150–400)
RBC: 3.07 MIL/uL — ABNORMAL LOW (ref 3.87–5.11)
RDW: 16.8 % — ABNORMAL HIGH (ref 11.5–15.5)
WBC: 12 K/uL — ABNORMAL HIGH (ref 4.0–10.5)
nRBC: 0.2 % (ref 0.0–0.2)

## 2024-09-25 LAB — COMPREHENSIVE METABOLIC PANEL WITH GFR
ALT: 50 U/L — ABNORMAL HIGH (ref 0–44)
AST: 27 U/L (ref 15–41)
Albumin: 4.2 g/dL (ref 3.5–5.0)
Alkaline Phosphatase: 110 U/L (ref 38–126)
Anion gap: 23 — ABNORMAL HIGH (ref 5–15)
BUN: 145 mg/dL — ABNORMAL HIGH (ref 8–23)
CO2: 19 mmol/L — ABNORMAL LOW (ref 22–32)
Calcium: 7.7 mg/dL — ABNORMAL LOW (ref 8.9–10.3)
Chloride: 97 mmol/L — ABNORMAL LOW (ref 98–111)
Creatinine, Ser: 14.8 mg/dL — ABNORMAL HIGH (ref 0.44–1.00)
GFR, Estimated: 2 mL/min — ABNORMAL LOW
Glucose, Bld: 124 mg/dL — ABNORMAL HIGH (ref 70–99)
Potassium: 4 mmol/L (ref 3.5–5.1)
Sodium: 138 mmol/L (ref 135–145)
Total Bilirubin: 0.4 mg/dL (ref 0.0–1.2)
Total Protein: 7.3 g/dL (ref 6.5–8.1)

## 2024-09-25 LAB — PRO BRAIN NATRIURETIC PEPTIDE: Pro Brain Natriuretic Peptide: 4245 pg/mL — ABNORMAL HIGH

## 2024-09-25 LAB — D-DIMER, QUANTITATIVE: D-Dimer, Quant: 2.08 ug{FEU}/mL — ABNORMAL HIGH (ref 0.00–0.50)

## 2024-09-25 LAB — TROPONIN T, HIGH SENSITIVITY: Troponin T High Sensitivity: 401 ng/L (ref 0–19)

## 2024-09-25 MED ORDER — IPRATROPIUM-ALBUTEROL 0.5-2.5 (3) MG/3ML IN SOLN
3.0000 mL | Freq: Once | RESPIRATORY_TRACT | Status: AC
  Administered 2024-09-25: 3 mL via RESPIRATORY_TRACT
  Filled 2024-09-25: qty 3

## 2024-09-25 MED ORDER — HEPARIN BOLUS VIA INFUSION
2000.0000 [IU] | Freq: Once | INTRAVENOUS | Status: AC
  Administered 2024-09-26: 2000 [IU] via INTRAVENOUS
  Filled 2024-09-25: qty 2000

## 2024-09-25 MED ORDER — METHYLPREDNISOLONE SODIUM SUCC 125 MG IJ SOLR
125.0000 mg | INTRAMUSCULAR | Status: AC
  Administered 2024-09-25: 125 mg via INTRAVENOUS
  Filled 2024-09-25: qty 2

## 2024-09-25 MED ORDER — FUROSEMIDE 10 MG/ML IJ SOLN
60.0000 mg | Freq: Once | INTRAMUSCULAR | Status: AC
  Administered 2024-09-25: 60 mg via INTRAVENOUS
  Filled 2024-09-25: qty 6

## 2024-09-25 MED ORDER — HEPARIN (PORCINE) 25000 UT/250ML-% IV SOLN
950.0000 [IU]/h | INTRAVENOUS | Status: DC
  Administered 2024-09-26: 950 [IU]/h via INTRAVENOUS
  Filled 2024-09-25: qty 250

## 2024-09-25 NOTE — H&P (Incomplete)
" °  ° °  Hospital Admission History and Physical Service Pager: [phone]  Patient name: Catherine Aguilar Medical record number: [id number] Date of Birth: 02/22/1956 Age: 69 y.o. Gender: female  Primary Care Provider: Adele Song, MD Consultants: *** Code Status: *** which was confirmed with family if patient unable to confirm ***  Preferred Emergency Contact: ***  Chief Complaint: ***  Assessment and Plan: Catherine Aguilar is a 69 y.o. female presenting with *** . Differential for presentation of this includes ***.   Assessment & Plan   Chronic and Stable Problems: ***   FEN/GI: *** VTE Prophylaxis: ***  Disposition: ***  History of Present Illness:  Catherine Aguilar is a 69 y.o. female w/PMHx of hypertension, type II diabetes, hyperlipidemia, stroke, Stage 5 CKD (baseline creatinine 10-12 not on HD), anemia of chronic kidney disease (baseline hemoglobin 9-11), Wegener's granulomatosis, COPD (not on supplemental oxygen) presenting with progressively worsening SOB x1 day.   + congestion and chills x2 weeks  + dry cough  + b/l LE edema Wheezes at baseline?  Recent hospitalization at [facility] on 08/17/2024 for CAP  HF exacerbation  H/o COPD, Wegner's possibly causing her lung and renal disease, CHF Presented with SOB probably a few weeks  Dry cough, intermittent chills No CP  Volume overloaded on exam  Hypertensive  EKG with RVH and incomplete LBBB Initial trop 401 Cards said demand ischemia in the setting of terrible kidney function Normal trop in December  Cards will come see  BNP elevated  Pulm edema on CXR  Not ready for dialysis yet  Ddimer elevated and trops, heparin  drip on can stop if trops stay flat    In the ED, ***  Review Of Systems: Per HPI with the following additions: ***  Pertinent Past Medical History: *** Remainder reviewed in history tab.   Pertinent Past Surgical History: ***  Remainder reviewed in history tab.  Pertinent Social  History: Tobacco use: Former, 10.1-pack-year smoking history, quit smoking cigarettes 5 years ago Alcohol use: Yes Other Substance use: Marijuana Lives with ***  Pertinent Family History: ***  Remainder reviewed in history tab.   Important Outpatient Medications: *** Remainder reviewed in medication history.   Objective: BP (!) 123/99   Pulse 66   Temp 98.6 F (37 C) (Oral)   Resp 20   Ht 5' 7 (1.702 m)   Wt 83 kg   SpO2 100%   BMI 28.66 kg/m  Exam: General: *** Eyes: *** ENTM: *** Neck: *** Cardiovascular: *** Respiratory: *** Gastrointestinal: *** MSK: *** Derm: *** Neuro: *** Psych: ***  Labs:  CBC BMET  Recent Labs  Lab 09/25/24 2134  WBC 12.0*  HGB 8.0*  HCT 24.8*  PLT 151   Recent Labs  Lab 09/25/24 2134  NA 138  K 4.0  CL 97*  CO2 19*  BUN 145*  CREATININE 14.80*  GLUCOSE 124*  CALCIUM  7.7*    Pertinent additional labs ***.  EKG: My own interpretation (not copied from electronic read) ***    Imaging Studies Performed:  Imaging Study (ie. Chest x-ray) Impression from Radiologist: ***   My Interpretation: ***   Lupie Credit, DO 09/25/2024, 11:49 PM PGY-1, [facility]  FPTS Intern pager: [phone], text pages welcome Secure chat group [facility] Teaching Service  "

## 2024-09-25 NOTE — ED Triage Notes (Signed)
 Patient BIB [facility] from home due to SOB x24 hrs. With EMS pt o2 sat WNL on no oxygen, however 2LNC placed for comfort. Patient states she has had congestion and chills x 1-2 weeks. Denies chest pain. VSS A&Ox4.

## 2024-09-25 NOTE — H&P (Addendum)
 "    Hospital Admission History and Physical Service Pager: [phone]  Patient name: Catherine Aguilar Medical record number: [id number] Date of Birth: 1956/04/10 Age: 69 y.o. Gender: female  Primary Care Provider: Adele Song, MD Consultants: Cardiology Code Status: Full code  Preferred Emergency Contact:  Contact Information     Name Relation Home Work [place] Spouse [phone]  [phone]      Other Contacts   None on File      Chief Complaint: progressive SOB  Assessment and Plan: Catherine Aguilar is a 69 y.o. female presenting with progressive SOB in the setting of medication non-adherence. Differential for presentation of this includes but is not limited to:  Volume overload secondary to progression of ESRD and medication non-adherence: most likely with previous hospitalizations for same, no history of CHF, hesitancy to begin HD, progressively worsening CKD, medication non-adherence of torsemide due to increased urination causing urinary accidents at home, LE edema on exam, elevated proBNP. PE: possible with elevated D-dimer and greater LLE edema, less likely without evidence on imaging or hypoxia/need for supplemental oxygen. ACS: possible with elevated initial troponin however less likely without reports of Cpand STEMI/NSTEMI evidence on EKG. Patient with known demand ischemia, and downtrending troponins reassuring.   Assessment & Plan Volume overload Acute kidney injury superimposed on stage 5 chronic kidney disease, not on chronic dialysis (HCC) Cr elevated at 14.8, baseline 10-12, BUN 124. Most likely secondary to progression of renal failure in the setting of medication non-adherence. CXR with evidence of pulmonary edema. Patient states she doesn't like to take her torsemide at home because it causes her to have urinary accidents. No documented history of CHF. Stage 5 CKD with Wegner's granulomatosis, patient has had HD discussions however is  hesitant to start HD.  - Admit to telemetry, FMTS under attending Dr. Donzetta  - s/p 60mg  IV Lasix  in ED  - One time dose of 40mg  IV Lasix  on admission  - Redose as appropriate for volume overload - Echocardiogram pending  - Last echo 12/11/2023 with LVEF of 60-65%, no regional wall abnormalities, mod LVH. - s/p negative viral panel in ED - Continue home sodium bicarbonate  1300 BID - Tylenol  q6h PRNfor pain  - Consider nephrology consultation - Strict I&Os - Daily weights - AM CBC, BMP - DME orders for incontinence supplies on discharge to encourage medication adherence Elevated troponin Initial 401, repeat 351. No reports of CP, diaphoresis, palpitations. BNP elevated to 4,245. Most likely related to demand ischemia.  - Cardiology consulted by EDP, appreciate recommendations  - continue to trend troponin  - no need for heparin  gtt from cardiology standpoint  - IV Lasix  80mg  BID, monitor UOP  - recommend nephrology consultation  - repeat echocardiogram  - anemia workup  Elevated d-dimer 2.08 on admission. Limited utility with patient history. LLE edema > R, but no erythema or tenderness. Most likely edema is 2/2 volume overload, but will get DVT US  to rule out clot. - Continue heparin  gtt on admission   - Low threshold to discontinue if bilateral DVT US  comes back negative - V/Q scan pending  COPD (chronic obstructive pulmonary disease) (HCC) Patient reports wheezing at baseline, states her home inhalers don't work. Scattered wheezes on exam.  - s/p Duoneb x1 and 125mcg Solumedrol in ED   - q4h DuoNebs PRN  - Continue home Breztri  inhaler 2 puffs daily  Anemia of chronic kidney failure, stage 5 (HCC) Seen at [facility] ED 09/23/2024, received 1 PRBC transfusion and  eloped.  - Stable at 8 on admission, baseline 9-11 - Transfusion threshold of <8 - AM Iron , TIBC, ferritin, and reticulocytes Chronic health problem T2DM: hold nighttime Lantus  on admission, start moderate SSI HTN:  continue home labetolol 300mg , amlodipine  10mg , and doxazosin  2mg  daily HLD: continue home pravastatin  40mg  daily   FEN/GI: Low sodium diet VTE Prophylaxis: Heparin  gtt  Disposition: Telemetry  History of Present Illness:  Catherine Aguilar is a 69 y.o. female w/PMHx of hypertension, type II diabetes, hyperlipidemia, stroke, Stage 5 CKD (baseline creatinine 10-12 not on HD), anemia of chronic kidney disease (baseline hemoglobin 9-11), Wegener's granulomatosis, COPD (not on home oxygen) presenting with progressively worsening SOB x1 day.   Patient reports months of SOB, but felt like it got worse over past few days. She was seen at [facility] ED on 09/23/2024 for a blood transfusion for a hgb of 6.4. States she has been taking most home medications and that her her home inhalers do not help. She admits that she has not taken her torsemide for at least the last day because it makes her pee every hour and causing urinary leakage and accidents at home. She also endorses LE edema, L > R, x2 weeks. Denies fevers, CP, palpitations, n/v/d, abdominal pain, sick symptoms or sick contacts.   Recent hospitalization at [facility] from 12/8 - 08/19/2024 for CAP.  In the ED, the patient was given a DuoNeb x1, 125mcg of Solumedrol, and 60mg  IV Lasix  with some relief. Labs were significant for BUN 145, Cr 14.8, Ca 7.7, anion gap 23, ALT 50, proBNP 4245, troponins 401/351, WBC 12.0, hgb 8.0, D-dimer 2.08. She had a negative quad viral panel. CXR was significant for mild pulmonary edema, cardiomegaly and aortic atherosclerosis, and remote right rib fractures. EKG SR with LVH.   Review Of Systems: Per HPI   Pertinent Past Medical History: CKD stage V Anemia of chronic kidney disease Asthma with COPD CVA Essential hypertension HLD Type 2 diabetes Wegener's granulomatosis with renal involvement Remainder reviewed in history tab.   Pertinent Past Surgical History:  Remainder reviewed in history tab.    Pertinent Social History: Tobacco use: Former, 10.1-pack-year smoking history, quit smoking cigarettes 5 years ago Alcohol use: No alcohol in 5 years Other Substance use: Marijuana daily Lives with Reeves (husband)  Pertinent Family History: Mother-MI, diabetes, hypertension, stroke Father-cancer, alcohol abuse, diabetes, hypertension Remainder reviewed in history tab.   Important Outpatient Medications: Albuterol  nebulizer solution Albuterol    Amlodipine  10 mg  Aspirin  81 mg  Azelastine  nasal spray Breztri  Inhaler Doxazosin  2 mg Fluticasone  nasal spray Lantus  Solostar pen 5 u nightly Labetalol  300 mg Pravastatin  40 mg Sodium bicarb 650 mg Torsemide 100 mg Remainder reviewed in medication history.   Objective: BP (!) 123/99   Pulse 66   Temp 98.6 F (37 C) (Oral)   Resp 20   Ht 5' 7 (1.702 m)   Wt 83 kg   SpO2 100%   BMI 28.66 kg/m  Exam: General: chronically ill appearing female sitting up comfortably in hospital bed, in no acute distress Eyes: sclera non-icteric, EOMI, crusting around inner canthi ENTM: MMM, neck supple with FROM Cardiovascular: RRR, no m/r/g, 2+ radial pulses bilaterally Respiratory: good respiratory effort, good air movement throughout all lung fields, faint wheezes to lower lobes, mild coarse breath sounds throughout, coarse dry cough, speaks to me in full clear sentences without difficulty  Gastrointestinal: moderately distended, non-tender, BS present  MSK: moves all extremities equally, trace pitting edema to RLE, 1+  pitting edema to LLE without tenderness Derm: no rashes or lesions  Neuro: alert and oriented x3, CN II-XII grossly intact  Psych: appropriate mood and affect   Labs:  CBC BMET  Recent Labs  Lab 09/25/24 2134  WBC 12.0*  HGB 8.0*  HCT 24.8*  PLT 151   Recent Labs  Lab 09/25/24 2134  NA 138  K 4.0  CL 97*  CO2 19*  BUN 145*  CREATININE 14.80*  GLUCOSE 124*  CALCIUM  7.7*    Pertinent additional labs:  proBNP 4245, Trop 401/351, D-dimer 2.08.   EKG: SR at 65bpm, Qtc 473  Imaging Studies Performed: CXR 09/27/2023 IMPRESSION: 1. Mild pulmonary edema. 2. Cardiomegaly and aortic atherosclerosis. 3. Remote right rib fractures.   Lupie Credit, DO 09/25/2024, 11:49 PM PGY-1, [place] [facility]  FPTS Intern pager: [phone], text pages welcome Secure chat group [facility] Teaching Service  "

## 2024-09-25 NOTE — Progress Notes (Signed)
 PHARMACY - ANTICOAGULATION CONSULT NOTE  Pharmacy Consult for heparin  Indication: chest pain/ACS  Allergies[1]  Patient Measurements: Height: 5' 7 (170.2 cm) Weight: 83 kg (183 lb) IBW/kg (Calculated) : 61.6 HEPARIN  DW (KG): 78.8  Vital Signs: Temp: 98.6 F (37 C) (01/16 2125) Temp Source: Oral (01/16 2125) BP: 123/99 (01/16 2125) Pulse Rate: 66 (01/16 2125)  Labs: Recent Labs    09/23/24 1312 09/23/24 1335 09/25/24 2134  HGB 6.4* 7.8* 8.0*  HCT 20.4* 23.0* 24.8*  PLT 143*  --  151  LABPROT 13.6  --   --   INR 1.0  --   --   CREATININE 14.90* 16.00* 14.80*    Estimated Creatinine Clearance: 4 mL/min (A) (by C-G formula based on SCr of 14.8 mg/dL (H)).  Assessment: 39 yoF presented with SOB. Pharmacy consulted to dose heparin  for ACS.  -Hgb 8 (hx of anemia, bl ~11-12, recently in ED for blood transfusion given Hgb 6.4 on 1/14), plts 151 -sCr 14 (bl~10-12) -d-dimer 2, Trop 400s -No PTA anticoagulation   Goal of Therapy:  Heparin  level 0.3-0.7 units/ml Monitor platelets by anticoagulation protocol: Yes   Plan:  -Small heparin  bolus 2000 units IV x1 given anemia history and recent blood transfusion -Start heparin  infusion at 950 units/hr -8h heparin  level -CBC, heparin  level daily -Cards consult   Lynwood Poplar, PharmD, BCPS Clinical Pharmacist 09/25/2024 11:53 PM        [1] No Known Allergies

## 2024-09-25 NOTE — ED Provider Notes (Signed)
 " [place] [facility] AT [facility] Provider Note   CSN: [id number] Arrival date & time: 09/25/24  2120     Patient presents with: Shortness of Breath   Catherine Aguilar is a 69 y.o. female.  {Add pertinent medical, surgical, social history, OB history to HPI:4811} 69 year old female history of COPD, stage V CKD not on dialysis, Wegener's granulomatosis involving the kidneys and potentially the lungs who presents to the emergency department shortness of breath.  Patient reports that she has had congestion and chills for the past 2 weeks.  Shortness of breath that worsened over the past 24 hours.  Has had a dry cough.  No hemoptysis.  Denies any chest pain. No fevers. Is having bilateral leg swelling.  Says that she still makes an adequate amount of urine with no changes to that recently.        Prior to Admission medications  Medication Sig Start Date End Date Taking? Authorizing Provider  albuterol  (PROVENTIL ) (2.5 MG/3ML) 0.083% nebulizer solution Take 3 mLs (2.5 mg total) by nebulization every 6 (six) hours as needed for wheezing or shortness of breath. Patient not taking: Reported on 08/17/2024 11/06/20   Lenon Lacks C, DO  albuterol  (VENTOLIN  HFA) 108 (90 Base) MCG/ACT inhaler INHALE 2 PUFFS BY MOUTH EVERY 4-6 HOURS Patient taking differently: Inhale 2 puffs into the lungs See admin instructions. INHALE 2 PUFFS INTO THE LUNGS EVERY 4-6 HOURS 06/29/24   Hindel, Rea, MD  amLODipine  (NORVASC ) 10 MG tablet Take 10 mg by mouth at bedtime. 12/06/23   [provider]  aspirin  81 MG EC tablet TAKE 1 TABLET BY MOUTH EVERY DAY Patient taking differently: Take 81 mg by mouth daily. 08/09/20   Lenon Lacks BROCKS, DO  azelastine  (ASTELIN ) 0.1 % nasal spray Place 2 sprays into both nostrils 2 (two) times daily. Use in each nostril as directed Patient taking differently: Place 2 sprays into both nostrils 2 (two) times daily as needed for rhinitis or allergies.  06/26/24   [name], MD  B-D UF III MINI PEN NEEDLES 31G X 5 MM MISC USE AS DIRECTED 12/16/23   Christia Budds, MD  benzonatate  (TESSALON ) 200 MG capsule TAKE 1 CAPSULE (200 MG TOTAL) BY MOUTH 3 (THREE) TIMES DAILY AS NEEDED FOR COUGH. 09/25/24   Hope Almarie ORN, NP  budesonide -glycopyrrolate -formoterol  (BREZTRI  AEROSPHERE) 160-9-4.8 MCG/ACT AERO inhaler Inhale 2 puffs into the lungs in the morning and at bedtime. Patient not taking: Reported on 08/17/2024 08/14/24   Hope Almarie ORN, NP  doxazosin  (CARDURA ) 2 MG tablet Take 2 mg by mouth in the morning and at bedtime. 10/31/23   [provider]  fluticasone  (FLONASE ) 50 MCG/ACT nasal spray Place 2 sprays into both nostrils daily. Patient not taking: Reported on 08/17/2024 09/14/19   Hope Almarie ORN, NP  glucose blood (ACCU-CHEK GUIDE) test strip Check blood glucose prior to lantus  administration 04/18/23   Jagadish, Mayuri, MD  guaiFENesin  (ROBITUSSIN) 100 MG/5ML liquid Take 5 mLs by mouth every 4 (four) hours as needed for cough or to loosen phlegm. Patient not taking: No sig reported 12/12/23   Tharon Lung, MD  insulin  glargine (LANTUS  SOLOSTAR) 100 UNIT/ML Solostar Pen Inject 5 Units into the skin at bedtime. 03/11/24   Adele Rea, MD  labetalol  (NORMODYNE ) 300 MG tablet Take 300 mg by mouth in the morning and at bedtime. 12/06/23   [provider]  Lancets Misc. (ACCU-CHEK SOFTCLIX LANCET DEV) KIT Check BG prior to administering insulin  04/18/23   Christia,  Mayuri, MD  pravastatin  (PRAVACHOL ) 40 MG tablet Take 40 mg by mouth at bedtime. 05/09/24   [provider]  sodium bicarbonate  650 MG tablet Take 2 tablets (1,300 mg total) by mouth 2 (two) times daily. 11/06/20   Lenon Chiquita BROCKS, DO  Spacer/Aero-Holding Raguel DEVI Please use with all inhaled medications 07/15/24   Adele Song, MD  torsemide (DEMADEX) 100 MG tablet Take 100 mg by mouth in the morning. 04/16/24   [provider]    Allergies:  Patient has no known allergies.    Review of Systems  Updated Vital Signs BP (!) 123/99   Pulse 66   Temp 98.6 F (37 C) (Oral)   Resp 20   Ht 5' 7 (1.702 m)   Wt 83 kg   SpO2 100%   BMI 28.66 kg/m   Physical Exam Vitals and nursing note reviewed.  Constitutional:      General: She is not in acute distress.    Appearance: She is well-developed.  HENT:     Head: Normocephalic and atraumatic.     Right Ear: External ear normal.     Left Ear: External ear normal.     Nose: Nose normal.  Eyes:     Extraocular Movements: Extraocular movements intact.     Conjunctiva/sclera: Conjunctivae normal.     Pupils: Pupils are equal, round, and reactive to light.  Cardiovascular:     Rate and Rhythm: Normal rate and regular rhythm.     Heart sounds: No murmur heard. Pulmonary:     Effort: Pulmonary effort is normal. No respiratory distress.     Breath sounds: Rales present.  Musculoskeletal:     Cervical back: Normal range of motion and neck supple.     Right lower leg: Edema present.     Left lower leg: Edema present.  Skin:    General: Skin is warm and dry.  Neurological:     Mental Status: She is alert and oriented to person, place, and time. Mental status is at baseline.  Psychiatric:        Mood and Affect: Mood normal.     (all labs ordered are listed, but only abnormal results are displayed) Labs Reviewed  CBC WITH DIFFERENTIAL/PLATELET - Abnormal; Notable for the following components:      Result Value   WBC 12.0 (*)    RBC 3.07 (*)    Hemoglobin 8.0 (*)    HCT 24.8 (*)    RDW 16.8 (*)    Neutro Abs 9.3 (*)    Abs Immature Granulocytes 0.11 (*)    All other components within normal limits  COMPREHENSIVE METABOLIC PANEL WITH GFR - Abnormal; Notable for the following components:   Chloride 97 (*)    CO2 19 (*)    Glucose, Bld 124 (*)    BUN 145 (*)    Creatinine, Ser 14.80 (*)    Calcium  7.7 (*)    ALT 50 (*)    GFR, Estimated 2 (*)    Anion gap 23 (*)     All other components within normal limits  PRO BRAIN NATRIURETIC PEPTIDE - Abnormal; Notable for the following components:   Pro Brain Natriuretic Peptide 4,245.0 (*)    All other components within normal limits  TROPONIN T, HIGH SENSITIVITY - Abnormal; Notable for the following components:   Troponin T High Sensitivity 401 (*)    All other components within normal limits  RESP PANEL BY RT-PCR (RSV, FLU A&B, COVID)  RVPGX2  EKG: EKG Interpretation Date/Time:  Friday September 25 2024 21:31:21 EST Ventricular Rate:  65 PR Interval:  150 QRS Duration:  104 QT Interval:  454 QTC Calculation: 473 R Axis:   -19  Text Interpretation: Sinus rhythm Probable left ventricular hypertrophy Confirmed by Yolande Charleston [phone]) on 09/25/2024 10:19:59 PM  Radiology: DG Chest Portable 1 View Result Date: 09/25/2024 EXAM: 1 VIEW(S) XRAY OF THE CHEST 09/25/2024 09:47:00 PM COMPARISON: 08/17/2024 CLINICAL HISTORY: SOB FINDINGS: LUNGS AND PLEURA: Mild pulmonary edema. No pleural effusion. No pneumothorax. HEART AND MEDIASTINUM: Cardiomegaly. Aortic atherosclerosis. BONES AND SOFT TISSUES: Remote right rib fractures. IMPRESSION: 1. Mild pulmonary edema. 2. Cardiomegaly and aortic atherosclerosis. 3. Remote right rib fractures. Electronically signed by: Greig Pique MD 09/25/2024 09:57 PM EST RP Workstation: [id number]    {Document cardiac monitor, telemetry assessment procedure when appropriate:32947} Procedures   Medications Ordered in the ED - No data to display    {Click here for ABCD2, HEART and other calculators REFRESH Note before signing:1}                              Medical Decision Making Amount and/or Complexity of Data Reviewed Labs: ordered. Radiology: ordered.  Risk Prescription drug management.   ***  {Document critical care time when appropriate  Document review of labs and clinical decision tools ie CHADS2VASC2, etc  Document your independent review of radiology images  and any outside records  Document your discussion with family members, caretakers and with consultants  Document social determinants of health affecting pt's care  Document your decision making why or why not admission, treatments were needed:32947:::1}   Final diagnoses:  None    ED Discharge Orders     None        "

## 2024-09-26 ENCOUNTER — Encounter (HOSPITAL_COMMUNITY): Payer: Self-pay | Admitting: Family Medicine

## 2024-09-26 ENCOUNTER — Inpatient Hospital Stay (HOSPITAL_COMMUNITY)

## 2024-09-26 DIAGNOSIS — J9691 Respiratory failure, unspecified with hypoxia: Secondary | ICD-10-CM

## 2024-09-26 DIAGNOSIS — Z789 Other specified health status: Secondary | ICD-10-CM

## 2024-09-26 DIAGNOSIS — N179 Acute kidney failure, unspecified: Secondary | ICD-10-CM

## 2024-09-26 DIAGNOSIS — R7989 Other specified abnormal findings of blood chemistry: Secondary | ICD-10-CM | POA: Insufficient documentation

## 2024-09-26 DIAGNOSIS — N185 Chronic kidney disease, stage 5: Secondary | ICD-10-CM

## 2024-09-26 DIAGNOSIS — I1310 Hypertensive heart and chronic kidney disease without heart failure, with stage 1 through stage 4 chronic kidney disease, or unspecified chronic kidney disease: Secondary | ICD-10-CM | POA: Insufficient documentation

## 2024-09-26 DIAGNOSIS — I2489 Other forms of acute ischemic heart disease: Secondary | ICD-10-CM

## 2024-09-26 DIAGNOSIS — E8770 Fluid overload, unspecified: Secondary | ICD-10-CM | POA: Diagnosis present

## 2024-09-26 DIAGNOSIS — E8779 Other fluid overload: Secondary | ICD-10-CM | POA: Diagnosis not present

## 2024-09-26 HISTORY — PX: IR TUNNELED CENTRAL VENOUS CATH PLC W IMG: IMG1939

## 2024-09-26 HISTORY — PX: IR US GUIDE VASC ACCESS RIGHT: IMG2390

## 2024-09-26 HISTORY — PX: IR FLUORO GUIDE CV LINE RIGHT: IMG2283

## 2024-09-26 LAB — BASIC METABOLIC PANEL WITH GFR
Anion gap: 24 — ABNORMAL HIGH (ref 5–15)
BUN: 145 mg/dL — ABNORMAL HIGH (ref 8–23)
CO2: 16 mmol/L — ABNORMAL LOW (ref 22–32)
Calcium: 7.4 mg/dL — ABNORMAL LOW (ref 8.9–10.3)
Chloride: 96 mmol/L — ABNORMAL LOW (ref 98–111)
Creatinine, Ser: 14.4 mg/dL — ABNORMAL HIGH (ref 0.44–1.00)
GFR, Estimated: 2 mL/min — ABNORMAL LOW
Glucose, Bld: 176 mg/dL — ABNORMAL HIGH (ref 70–99)
Potassium: 4.4 mmol/L (ref 3.5–5.1)
Sodium: 136 mmol/L (ref 135–145)

## 2024-09-26 LAB — FERRITIN: Ferritin: 694 ng/mL — ABNORMAL HIGH (ref 11–307)

## 2024-09-26 LAB — CBC
HCT: 22.8 % — ABNORMAL LOW (ref 36.0–46.0)
Hemoglobin: 7.3 g/dL — ABNORMAL LOW (ref 12.0–15.0)
MCH: 26.2 pg (ref 26.0–34.0)
MCHC: 32 g/dL (ref 30.0–36.0)
MCV: 81.7 fL (ref 80.0–100.0)
Platelets: 133 K/uL — ABNORMAL LOW (ref 150–400)
RBC: 2.79 MIL/uL — ABNORMAL LOW (ref 3.87–5.11)
RDW: 16.8 % — ABNORMAL HIGH (ref 11.5–15.5)
WBC: 14.7 K/uL — ABNORMAL HIGH (ref 4.0–10.5)
nRBC: 0 % (ref 0.0–0.2)

## 2024-09-26 LAB — HEPATITIS B SURFACE ANTIGEN: Hepatitis B Surface Ag: NONREACTIVE

## 2024-09-26 LAB — TROPONIN T, HIGH SENSITIVITY
Troponin T High Sensitivity: 331 ng/L (ref 0–19)
Troponin T High Sensitivity: 347 ng/L (ref 0–19)
Troponin T High Sensitivity: 351 ng/L (ref 0–19)

## 2024-09-26 LAB — CBG MONITORING, ED
Glucose-Capillary: 173 mg/dL — ABNORMAL HIGH (ref 70–99)
Glucose-Capillary: 163 mg/dL — ABNORMAL HIGH (ref 70–99)
Glucose-Capillary: 158 mg/dL — ABNORMAL HIGH (ref 70–99)

## 2024-09-26 LAB — IRON AND TIBC
Iron: 56 ug/dL (ref 28–170)
Saturation Ratios: 26 % (ref 10.4–31.8)
TIBC: 216 ug/dL — ABNORMAL LOW (ref 250–450)
UIBC: 160 ug/dL

## 2024-09-26 LAB — GLUCOSE, CAPILLARY
Glucose-Capillary: 137 mg/dL — ABNORMAL HIGH (ref 70–99)
Glucose-Capillary: 117 mg/dL — ABNORMAL HIGH (ref 70–99)
Glucose-Capillary: 124 mg/dL — ABNORMAL HIGH (ref 70–99)

## 2024-09-26 LAB — RETICULOCYTES
Immature Retic Fract: 14.5 % (ref 2.3–15.9)
RBC.: 2.85 MIL/uL — ABNORMAL LOW (ref 3.87–5.11)
Retic Count, Absolute: 69 K/uL (ref 19.0–186.0)
Retic Ct Pct: 2.4 % (ref 0.4–3.1)

## 2024-09-26 LAB — HEMOGLOBIN A1C
Hgb A1c MFr Bld: 5.7 % — ABNORMAL HIGH (ref 4.8–5.6)
Mean Plasma Glucose: 116.89 mg/dL

## 2024-09-26 MED ORDER — FUROSEMIDE 10 MG/ML IJ SOLN
80.0000 mg | Freq: Two times a day (BID) | INTRAMUSCULAR | Status: DC
  Administered 2024-09-26 – 2024-09-27 (×3): 80 mg via INTRAVENOUS
  Filled 2024-09-26 (×4): qty 8

## 2024-09-26 MED ORDER — HYDROMORPHONE HCL 1 MG/ML IJ SOLN
0.5000 mg | INTRAMUSCULAR | Status: AC
  Administered 2024-09-26: 0.5 mg via INTRAVENOUS
  Filled 2024-09-26: qty 0.5

## 2024-09-26 MED ORDER — BUDESON-GLYCOPYRROL-FORMOTEROL 160-9-4.8 MCG/ACT IN AERO
2.0000 | INHALATION_SPRAY | Freq: Every day | RESPIRATORY_TRACT | Status: DC
  Administered 2024-09-27 – 2024-10-08 (×3): 2 via RESPIRATORY_TRACT
  Filled 2024-09-26 (×2): qty 5.9

## 2024-09-26 MED ORDER — CHLORHEXIDINE GLUCONATE CLOTH 2 % EX PADS
6.0000 | MEDICATED_PAD | Freq: Every day | CUTANEOUS | Status: DC
  Administered 2024-09-26 – 2024-10-08 (×12): 6 via TOPICAL

## 2024-09-26 MED ORDER — SODIUM BICARBONATE 650 MG PO TABS
1300.0000 mg | ORAL_TABLET | Freq: Two times a day (BID) | ORAL | Status: DC
  Administered 2024-09-26 – 2024-09-27 (×3): 1300 mg via ORAL
  Filled 2024-09-26 (×3): qty 2

## 2024-09-26 MED ORDER — BENZONATATE 100 MG PO CAPS
200.0000 mg | ORAL_CAPSULE | Freq: Three times a day (TID) | ORAL | Status: DC | PRN
  Administered 2024-09-26 – 2024-10-06 (×4): 200 mg via ORAL
  Filled 2024-09-26 (×4): qty 2

## 2024-09-26 MED ORDER — LABETALOL HCL 200 MG PO TABS: 300.0000 mg | ORAL_TABLET | Freq: Every day | ORAL | Status: DC

## 2024-09-26 MED ORDER — HEPARIN SODIUM (PORCINE) 1000 UNIT/ML DIALYSIS: 1000.0000 [IU] | INTRAMUSCULAR | Status: DC | PRN

## 2024-09-26 MED ORDER — ACETAMINOPHEN 650 MG RE SUPP: 650.0000 mg | Freq: Four times a day (QID) | RECTAL | Status: DC | PRN

## 2024-09-26 MED ORDER — CARVEDILOL 12.5 MG PO TABS
12.5000 mg | ORAL_TABLET | Freq: Two times a day (BID) | ORAL | Status: DC
  Administered 2024-09-26 – 2024-09-27 (×4): 12.5 mg via ORAL
  Filled 2024-09-26 (×4): qty 1

## 2024-09-26 MED ORDER — SODIUM CHLORIDE 0.9% FLUSH: 10.0000 mL | INTRAVENOUS | Status: DC | PRN

## 2024-09-26 MED ORDER — LIDOCAINE HCL 1 % IJ SOLN
INTRAMUSCULAR | Status: AC
  Filled 2024-09-26: qty 20

## 2024-09-26 MED ORDER — HEPARIN SODIUM (PORCINE) 1000 UNIT/ML IJ SOLN
10000.0000 [IU] | Freq: Once | INTRAMUSCULAR | Status: AC
  Administered 2024-09-26: 2.6 mL via INTRAVENOUS

## 2024-09-26 MED ORDER — SODIUM CHLORIDE 0.9% FLUSH
10.0000 mL | Freq: Two times a day (BID) | INTRAVENOUS | Status: DC
  Administered 2024-09-28: 10 mL

## 2024-09-26 MED ORDER — AMLODIPINE BESYLATE 10 MG PO TABS
10.0000 mg | ORAL_TABLET | Freq: Every day | ORAL | Status: DC
  Administered 2024-09-26 – 2024-10-07 (×10): 10 mg via ORAL
  Filled 2024-09-26 (×7): qty 1
  Filled 2024-09-26: qty 2
  Filled 2024-09-26 (×2): qty 1

## 2024-09-26 MED ORDER — PRAVASTATIN SODIUM 40 MG PO TABS
40.0000 mg | ORAL_TABLET | Freq: Every day | ORAL | Status: DC
  Administered 2024-09-27 – 2024-10-07 (×11): 40 mg via ORAL
  Filled 2024-09-26 (×11): qty 1

## 2024-09-26 MED ORDER — HEPARIN SOD (PORK) LOCK FLUSH 100 UNIT/ML IV SOLN
INTRAVENOUS | Status: AC
  Filled 2024-09-26: qty 5

## 2024-09-26 MED ORDER — DOXAZOSIN MESYLATE 2 MG PO TABS
2.0000 mg | ORAL_TABLET | Freq: Every day | ORAL | Status: DC
  Administered 2024-09-26 – 2024-10-08 (×12): 2 mg via ORAL
  Filled 2024-09-26 (×16): qty 1

## 2024-09-26 MED ORDER — ANTICOAGULANT SODIUM CITRATE 4% (200MG/5ML) IV SOLN: 5.0000 mL | Status: DC | PRN

## 2024-09-26 MED ORDER — HEPARIN SODIUM (PORCINE) 1000 UNIT/ML IJ SOLN
INTRAMUSCULAR | Status: AC
  Filled 2024-09-26: qty 10

## 2024-09-26 MED ORDER — ACETAMINOPHEN 325 MG PO TABS
650.0000 mg | ORAL_TABLET | Freq: Four times a day (QID) | ORAL | Status: DC | PRN
  Administered 2024-09-26 – 2024-10-02 (×9): 650 mg via ORAL
  Filled 2024-09-26 (×10): qty 2

## 2024-09-26 MED ORDER — IPRATROPIUM-ALBUTEROL 0.5-2.5 (3) MG/3ML IN SOLN
3.0000 mL | RESPIRATORY_TRACT | Status: DC | PRN
  Administered 2024-09-27: 3 mL via RESPIRATORY_TRACT
  Filled 2024-09-26: qty 3

## 2024-09-26 MED ORDER — ORAL CARE MOUTH RINSE: 15.0000 mL | OROMUCOSAL | Status: DC | PRN

## 2024-09-26 MED ORDER — LIDOCAINE HCL 1 % IJ SOLN
20.0000 mL | Freq: Once | INTRAMUSCULAR | Status: AC
  Administered 2024-09-26: 2 mL
  Filled 2024-09-26: qty 20

## 2024-09-26 MED ORDER — INSULIN ASPART 100 UNIT/ML IJ SOLN
0.0000 [IU] | Freq: Three times a day (TID) | INTRAMUSCULAR | Status: DC
  Administered 2024-09-26 – 2024-09-30 (×5): 3 [IU] via SUBCUTANEOUS
  Administered 2024-10-01 – 2024-10-06 (×5): 2 [IU] via SUBCUTANEOUS
  Filled 2024-09-26 (×2): qty 2
  Filled 2024-09-26: qty 3
  Filled 2024-09-26 (×2): qty 2
  Filled 2024-09-26 (×3): qty 3
  Filled 2024-09-26: qty 2
  Filled 2024-09-26: qty 3

## 2024-09-26 MED ORDER — FUROSEMIDE 10 MG/ML IJ SOLN
40.0000 mg | Freq: Once | INTRAMUSCULAR | Status: AC
  Administered 2024-09-26: 40 mg via INTRAVENOUS
  Filled 2024-09-26: qty 4

## 2024-09-26 MED ORDER — HYDRALAZINE HCL 50 MG PO TABS
100.0000 mg | ORAL_TABLET | Freq: Three times a day (TID) | ORAL | Status: DC
  Administered 2024-09-26 – 2024-10-08 (×32): 100 mg via ORAL
  Filled 2024-09-26 (×5): qty 2
  Filled 2024-09-26: qty 4
  Filled 2024-09-26 (×10): qty 2
  Filled 2024-09-26: qty 4
  Filled 2024-09-26 (×16): qty 2

## 2024-09-26 MED ORDER — ALTEPLASE 2 MG IJ SOLR: 2.0000 mg | Freq: Once | INTRAMUSCULAR | Status: DC | PRN

## 2024-09-26 NOTE — Assessment & Plan Note (Deleted)
*** ° °  Cr elevated at 14.8, baseline 10-12, BUN 124. Most likely secondary to progression of renal failure in the setting of medication non-adherence. CXR with evidence of pulmonary edema. Patient states she doesn't like to take her torsemide at home because it causes her to have urinary accidents. No documented history of CHF. Stage 5 CKD with Wegner's granulomatosis, patient has had HD discussions however is hesitant to start HD.   - s/p 60mg  IV Lasix  in ED  - One time dose of 40mg  IV Lasix  on admission  - Redose as appropriate for volume overload - Echocardiogram pending  - Last echo 12/11/2023 with LVEF of 60-65%, no regional wall abnormalities, mod LVH. - s/p negative viral panel in ED - Continue home sodium bicarbonate  1300 BID - Tylenol  q6h PRNfor pain  - Consider nephrology consultation - Strict I&Os - Daily weights - AM CBC, BMP - DME orders for incontinence supplies on discharge to encourage medication adherence

## 2024-09-26 NOTE — Assessment & Plan Note (Addendum)
 2.08 on admission; unclear the significance of this given patient history.  This morning she continues to be without erythema/tenderness to either calf.  Strongly suspect LE edema on admission secondary to volume overload as it has greatly improved today. - Will obtain DVT ultrasound, V/Q scan - Holding heparin  gtt due to decrease in Hgb 8.0>7.3

## 2024-09-26 NOTE — Assessment & Plan Note (Signed)
 Cr elevated at 14.8, baseline 10-12, BUN 124. Most likely secondary to progression of renal failure in the setting of medication non-adherence. CXR with evidence of pulmonary edema. Patient states she doesn't like to take her torsemide at home because it causes her to have urinary accidents. No documented history of CHF. Stage 5 CKD with Wegner's granulomatosis, patient has had HD discussions however is hesitant to start HD.  - Admit to telemetry, FMTS under attending Dr. Donzetta  - s/p 60mg  IV Lasix  in ED  - One time dose of 40mg  IV Lasix  on admission  - Redose as appropriate for volume overload - Echocardiogram pending  - Last echo 12/11/2023 with LVEF of 60-65%, no regional wall abnormalities, mod LVH. - s/p negative viral panel in ED - Continue home sodium bicarbonate  1300 BID - Tylenol  q6h PRNfor pain  - Consider nephrology consultation - Strict I&Os - Daily weights - AM CBC, BMP - DME orders for incontinence supplies on discharge to encourage medication adherence

## 2024-09-26 NOTE — Assessment & Plan Note (Addendum)
 T2DM: hold nighttime Lantus  on admission, start moderate SSI HTN: continue home labetolol 300mg , amlodipine  10mg , and doxazosin  2mg  daily HLD: continue home pravastatin  40mg  daily

## 2024-09-26 NOTE — Assessment & Plan Note (Signed)
 Initial 401, repeat 351. No reports of CP, diaphoresis, palpitations. BNP elevated to 4,245. Most likely related to demand ischemia.  - Cardiology consulted by EDP, appreciate recommendations  - continue to trend troponin  - no need for heparin  gtt from cardiology standpoint  - IV Lasix  80mg  BID, monitor UOP  - recommend nephrology consultation  - repeat echocardiogram  - anemia workup

## 2024-09-26 NOTE — Progress Notes (Signed)
 Request received for HD catheter placement.   Reviewed with Dr. Jennefer who recommends temp cath placement due to leukocytosis.  Nephrology notified.  Primary tem notified that the patient can have diet from IR standpoint.   Consent in IR, plan to proceed this PM.   Please call IR for questions and concerns.   Phat Dalton H Alekxander Isola PA-C 09/26/2024 10:17 AM

## 2024-09-26 NOTE — Assessment & Plan Note (Deleted)
*** ° °  Initial 401, repeat 351. No reports of CP, diaphoresis, palpitations. BNP elevated to 4,245. Most likely related to demand ischemia.  - Cardiology consulted by EDP, appreciate recommendations  - continue to trend troponin  - no need for heparin  gtt from cardiology standpoint  - IV Lasix  80mg  BID, monitor UOP  - recommend nephrology consultation  - repeat echocardiogram  - anemia workup

## 2024-09-26 NOTE — Assessment & Plan Note (Addendum)
 Trended 401>351.  Still with no reports of chest pain this morning. Continue to suspect demand ischemia, however will continue workup as appropriate. Anemia workup consistent with anemia of chronic disease. - Cardiology consulted, appreciate recommendations. - Continue to trend troponin - Repeat echocardiogram pending

## 2024-09-26 NOTE — Assessment & Plan Note (Deleted)
*** ° °  Seen at [facility] ED 09/23/2024, received 1 PRBC transfusion and eloped.  - Stable at 8 on admission, baseline 9-11 - Transfusion threshold of <8 - AM Iron , TIBC, ferritin, and reticulocytes

## 2024-09-26 NOTE — Assessment & Plan Note (Signed)
 Cr remains elevated at 14.4; baseline appears to be 10-12. Has received total 100 mg IV Lasix  since arrival to the ED. Last echo 12/2023 with LVEF of 60-65%, no regional wall motion abnormalities, moderate LVH. - Nephrology consulted, appreciate recommendations. - Patient now agreeable to starting dialysis; HD catheter to be placed with IR today with first HD session following. - IV Lasix  80 mg twice daily - Echocardiogram pending - Tylenol  q6h PRN for pain  - Strict I&Os - Daily weights - AM CBC, RFP - Continue home sodium bicarbonate  1300 BID - Avoid nephrotoxic agents

## 2024-09-26 NOTE — Assessment & Plan Note (Signed)
 Patient reports wheezing at baseline, states her home inhalers don't work. Scattered wheezes on exam.  - s/p Duoneb x1 and 125mcg Solumedrol in ED   - q4h DuoNebs PRN  - Continue home Breztri  inhaler 2 puffs daily

## 2024-09-26 NOTE — Procedures (Signed)
 Interventional Radiology Procedure Note  Procedure: Temporary hemodialysis catheter placement   Findings: Please refer to procedural dictation for full description. 16 cm Trialysis, right internal jugular.  Complications: None immediate  Estimated Blood Loss: <5 mL  Recommendations: Catheter ready for immediate use.   Ester Sides, MD

## 2024-09-26 NOTE — Assessment & Plan Note (Deleted)
 T2DM: hold nighttime Lantus  on admission, start moderate SSI HTN: continue home labetolol 300mg , amlodipine  10mg , and doxazosin  2mg  daily HLD: continue home pravastatin  40mg  daily

## 2024-09-26 NOTE — Assessment & Plan Note (Signed)
 T2DM: hold nighttime Lantus  on admission, start moderate SSI HTN: continue home labetolol 300mg , amlodipine  10mg , and doxazosin  2mg  daily HLD: continue home pravastatin  40mg  daily

## 2024-09-26 NOTE — Assessment & Plan Note (Deleted)
*** ° °  Patient reports wheezing at baseline, states her home inhalers don't work. Scattered wheezes on exam.  - s/p Duoneb x1 and 125mcg Solumedrol in ED   - q4h DuoNebs PRN  - Continue home Breztri  inhaler 2 puffs daily

## 2024-09-26 NOTE — Assessment & Plan Note (Deleted)
*** °  Cr remains elevated at***; baseline appears to be 10-12. Has received total 100 mg IV Lasix  since arrival to the ED*** Last echo 12/2023 with LVEF of 60-65%, no regional wall motion abnormalities, moderate LVH.  - Nephrology consulted, appreciate recommendations. - Lasix *** - Echocardiogram pending - Tylenol  q6h PRN for pain  - Strict I&Os - Daily weights - AM CBC, BMP - Continue home sodium bicarbonate  1300 BID  Cr elevated at 14.8, baseline 10-12, BUN 124. Most likely secondary to progression of renal failure in the setting of medication non-adherence. CXR with evidence of pulmonary edema. Patient states she doesn't like to take her torsemide at home because it causes her to have urinary accidents. No documented history of CHF. Stage 5 CKD with Wegner's granulomatosis, patient has had HD discussions however is hesitant to start HD.   - s/p 60mg  IV Lasix  in ED  - One time dose of 40mg  IV Lasix  on admission  - Redose as appropriate for volume overload - Echocardiogram pending  - Last echo 12/11/2023 with LVEF of 60-65%, no regional wall abnormalities, mod LVH. - Continue home sodium bicarbonate  1300 BID - Tylenol  q6h PRNfor pain  - Strict I&Os - Daily weights - AM CBC, BMP - DME orders for incontinence supplies on discharge to encourage medication adherence

## 2024-09-26 NOTE — ED Notes (Signed)
 Pt is ambulatory to restroom with independent steady gait.

## 2024-09-26 NOTE — Assessment & Plan Note (Deleted)
 Cr elevated at 14.8, baseline 10-12, BUN 124. Most likely secondary to progression of renal failure in the setting of medication non-adherence. CXR with evidence of pulmonary edema. Patient states Catherine Aguilar doesn't like to take her torsemide at home because it causes her to have urinary accidents. No documented history of CHF. Stage 5 CKD, patient has had HD discussions however is hesitant to start HD.  - Admit to telemetry, FMTS under attending Dr. Donzetta  - s/p 60mg  IV Lasix  in ED  - One time dose of 40mg  IV Lasix  on admission  - Redose as appropriate for volume overload - s/p negative viral panel in ED - Continue home sodium bicarbonate  1300 BID - Tylenol  q6h PRNfor pain  - Strict I&Os - Daily weights - AM CBC, BMP - DME orders for incontinence supplies on discharge to encourage medication adherence

## 2024-09-26 NOTE — Consult Note (Addendum)
 "  Cardiology Consultation   Patient ID: Catherine Aguilar MRN: [id number]; DOB: 25-Nov-1955  Admit date: 09/25/2024 Date of Consult: 09/26/2024  PCP:  Adele Song, MD   [facility] Providers Cardiologist:  None        Patient Profile: Catherine Aguilar is a 69 y.o. female with a hx of CKD stage V not on dialysis, Wegener's granulomatosis involving kidneys but not lungs, COPD, diabetes, anemia  who is being seen 09/26/2024 for the evaluation of elevated troponin, BNP at the request of Dr Donzetta.  History of Present Illness: [name] is a 69 y.o. female with a hx of CKD stage V not on dialysis, Wegener's granulomatosis involving kidneys but not lungs, COPD, diabetes, anemia  who is being seen 09/26/2024 for the evaluation of elevated troponin, BNP.  Patient was recently discharged on 08/19/2024 after she was admitted with pneumonia, COPD exacerbation and AKI creatinine at that time was 13.  She tells me she came back to the hospital for shortness of breath and fluid retention.  She is not wanting to do dialysis at this time.  Her labs do show significant kidney dysfunction.  She makes urine but wants to try Lasix .  Labs notable for hemoglobin of 7.3.  Pro BNP 4245.  Troponin 401 and trending down.  Serum creatinine 14.4.  BUN 145.  Serum bicarbonate value 16.  Chest x-ray shows mild pulmonary edema.  She had an echo on 12/11/2023 which showed normal LV function and no significant pulmonary hypertension.    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   CVA (cerebral vascular accident) (HCC)  07/03/2012   Diffuse pulmonary alveolar hemorrhage    Edema of right lower extremity 03/25/2017   Essential hypertension 12/08/2016   Hemoptysis 12/08/2016   High cholesterol    HLD (hyperlipidemia) 12/08/2016   Hx of adenomatous polyp of colon 04/16/2017   Hypertension    stopped taking medications 10 years ago   Hypokalemia 07/02/2012   Intra-alveolar hemorrhage 12/08/2016   Left facial numbness 07/02/2012   Sepsis (HCC) 12/08/2016   Shortness of breath 12/02/2017   Stroke (HCC)    Substance abuse (HCC) 12/08/2016   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   Tobacco abuse 12/08/2016   Type 2 diabetes mellitus without complication, with long-term current use of insulin  (HCC) 02/11/2017   Wegener's granulomatosis with renal involvement (HCC) 12/08/2016    Past Surgical History:  Procedure Laterality Date   ABDOMINAL HYSTERECTOMY  1985       Scheduled Meds:  amLODipine   10 mg Oral Daily   budesonide -glycopyrrolate -formoterol   2 puff Inhalation Daily   doxazosin   2 mg Oral Daily   insulin  aspart  0-15 Units Subcutaneous TID WC   labetalol   300 mg Oral Daily   pravastatin   40 mg Oral QHS   sodium bicarbonate   1,300 mg Oral BID   Continuous Infusions:  heparin  950 Units/hr (09/26/24 0057)   PRN Meds: acetaminophen  **OR** acetaminophen   Allergies:   Allergies[1]  Social History:   Social History   Socioeconomic History   Marital status: Married    Spouse name:  Not on file   Number of children: Not on file   Years of education: Not on file   Highest education level: Not on file  Occupational History   Not on file  Tobacco Use   Smoking status: Former    Current packs/day: 0.00    Average packs/day: 0.2 packs/day for 50.5 years (10.1 ttl pk-yrs)    Types: Cigarettes    Start date: 09/10/1968    Quit date: 03/25/2019    Years since quitting: 5.5   Smokeless tobacco: Never  Vaping Use   Vaping status: Never Used  Substance and Sexual Activity   Alcohol use: Yes     Comment: on the weekends - moderately per pt   Drug use: Yes    Types: Marijuana    Comment: Friday, Saturday, Sunday   Sexual activity: Not Currently  Other Topics Concern   Not on file  Social History Narrative   Not on file   Social Drivers of Health   Tobacco Use: Medium Risk (09/25/2024)   Patient History    Smoking Tobacco Use: Former    Smokeless Tobacco Use: Never    Passive Exposure: Not on Actuary Strain: Low Risk (06/11/2024)   Overall Financial Resource Strain (CARDIA)    Difficulty of Paying Living Expenses: Not hard at all  Food Insecurity: No Food Insecurity (08/18/2024)   Epic    Worried About Radiation Protection Practitioner of Food in the Last Year: Never true    Ran Out of Food in the Last Year: Never true  Transportation Needs: No Transportation Needs (08/18/2024)   Epic    Lack of Transportation (Medical): No    Lack of Transportation (Non-Medical): No  Physical Activity: Inactive (06/11/2024)   Exercise Vital Sign    Days of Exercise per Week: 0 days    Minutes of Exercise per Session: 0 min  Stress: No Stress Concern Present (06/11/2024)   [employer] of Occupational Health - Occupational Stress Questionnaire    Feeling of Stress: Not at all  Social Connections: Moderately Integrated (08/18/2024)   Social Connection and Isolation Panel    Frequency of Communication with Friends and Family: Three times a week    Frequency of Social Gatherings with Friends and Family: Three times a week    Attends Religious Services: More than 4 times per year    Active Member of Clubs or Organizations: No    Attends Banker Meetings: Never    Marital Status: Married  Catering Manager Violence: Not At Risk (08/18/2024)   Epic    Fear of Current or Ex-Partner: No    Emotionally Abused: No    Physically Abused: No    Sexually Abused: No  Depression (PHQ2-9): Low Risk (07/15/2024)   Depression (PHQ2-9)    PHQ-2 Score: 1  Alcohol Screen: Low Risk (05/21/2023)    Alcohol Screen    Last Alcohol Screening Score (AUDIT): 2  Housing: Low Risk (08/18/2024)   Epic    Unable to Pay for Housing in the Last Year: No    Number of Times Moved in the Last Year: 1    Homeless in the Last Year: No  Utilities: Not At Risk (08/18/2024)   Epic    Threatened with loss of utilities: No  Health Literacy: Adequate Health Literacy (06/11/2024)   B1300 Health Literacy    Frequency of need for help with medical instructions: Never    Family History:    Family History  Problem Relation Age  of Onset   Heart attack Mother    Diabetes Mother    Hypertension Mother    Stroke Mother    Cancer Father    Alcohol abuse Father    Diabetes Father    Hypertension Father    Breast cancer Neg Hx    Colon cancer Neg Hx    Esophageal cancer Neg Hx    Rectal cancer Neg Hx    Stomach cancer Neg Hx    Pancreatic cancer Neg Hx    BRCA 1/2 Neg Hx      ROS:  Please see the history of present illness.   All other ROS reviewed and negative.     Physical Exam/Data: Vitals:   09/25/24 2123 09/25/24 2125 09/25/24 2126  BP:  (!) 123/99   Pulse:  66   Resp:  20   Temp:  98.6 F (37 C)   TempSrc:  Oral   SpO2: 100% 100%   Weight:   83 kg  Height:   5' 7 (1.702 m)   No intake or output data in the 24 hours ending 09/26/24 0059    09/25/2024    9:26 PM 08/19/2024    4:20 AM 08/18/2024    6:01 AM  Last 3 Weights  Weight (lbs) 183 lb 187 lb 9.6 oz 186 lb 14.4 oz  Weight (kg) 83.008 kg 85.095 kg 84.777 kg     Body mass index is 28.66 kg/m.  General:  Well nourished, well developed, in no acute distress HEENT: normal Neck: JVD 10-12 cmH2O Vascular: No carotid bruits; Distal pulses 2+ bilaterally Cardiac:  normal S1, S2; RRR; no murmur  Lungs: Bilateral crackles Abd: soft, nontender, no hepatomegaly  Ext: 1+ edema Musculoskeletal:  No deformities, BUE and BLE strength normal and equal Skin: warm and dry  Neuro:  CNs 2-12 intact, no focal abnormalities  noted Psych:  Normal affect     Laboratory Data: High Sensitivity Troponin:  No results for input(s): TROPONINIHS in the last 720 hours.  Recent Labs  Lab 09/25/24 2136 09/25/24 2336  TRNPT 401* 351*      Chemistry Recent Labs  Lab 09/23/24 1312 09/23/24 1335 09/25/24 2134  NA 140 139 138  K 4.1 4.0 4.0  CL 98 99 97*  CO2 18*  --  19*  GLUCOSE 98 95 124*  BUN 145* >130* 145*  CREATININE 14.90* 16.00* 14.80*  CALCIUM  7.3*  --  7.7*  GFRNONAA 2*  --  2*  ANIONGAP 24*  --  23*    Recent Labs  Lab 09/23/24 1312 09/25/24 2134  PROT 6.8 7.3  ALBUMIN 3.8 4.2  AST 32 27  ALT 54* 50*  ALKPHOS 102 110  BILITOT 0.3 0.4   Lipids No results for input(s): CHOL, TRIG, HDL, LABVLDL, LDLCALC, CHOLHDL in the last 168 hours.  Hematology Recent Labs  Lab 09/23/24 1312 09/23/24 1335 09/25/24 2134  WBC 8.6  --  12.0*  RBC 2.51*  --  3.07*  HGB 6.4* 7.8* 8.0*  HCT 20.4* 23.0* 24.8*  MCV 81.3  --  80.8  MCH 25.5*  --  26.1  MCHC 31.4  --  32.3  RDW 16.4*  --  16.8*  PLT 143*  --  151   Thyroid  No results for input(s): TSH, FREET4 in the last 168 hours.  BNP Recent Labs  Lab 09/25/24 2136  PROBNP 4,245.0*    DDimer  Recent Labs  Lab 09/25/24 2229  DDIMER 2.08*    Radiology/Studies:  DG  Chest Portable 1 View Result Date: 09/25/2024 EXAM: 1 VIEW(S) XRAY OF THE CHEST 09/25/2024 09:47:00 PM COMPARISON: 08/17/2024 CLINICAL HISTORY: SOB FINDINGS: LUNGS AND PLEURA: Mild pulmonary edema. No pleural effusion. No pneumothorax. HEART AND MEDIASTINUM: Cardiomegaly. Aortic atherosclerosis. BONES AND SOFT TISSUES: Remote right rib fractures. IMPRESSION: 1. Mild pulmonary edema. 2. Cardiomegaly and aortic atherosclerosis. 3. Remote right rib fractures. Electronically signed by: Greig Pique MD 09/25/2024 09:57 PM EST RP Workstation: [id number]     Assessment and Plan:  # Acute hypoxic respiratory failure # CKD stage V # Volume overload - Her overall  clinical course is more consistent with worsening kidney failure.  Her echo was her normal with normal LV function in April.  We will repeat this. - I will defer diuresis to the nephrology team.  I suspect she needs to just be initiated on hemodialysis.  # Elevated troponin, demand ischemia - Secondary to volume overload.  Not an acute coronary syndrome. - She is on pravastatin .  Her most recent LDL was 48.  Recheck tomorrow. - No aspirin  in the setting of anemia requiring recurrent transfusion.  # Anemia - Likely due to kidney disease.  Required recurrent transfusion.  No aspirin .  # HTN - BPs are poorly controlled.  Add hydralazine  100 mg 3 times daily.  Continue amlodipine  10 mg daily.  Stop labetalol .  Add carvedilol  12.5 mg twice daily.  Also on doxazosin  2 mg daily.    For questions or updates, please contact [facility] Please consult [email or web address] for contact info under   Signed, Darryle T. Barbaraann, MD, [facility]  [facility]  [address] [place], [place] [address] [phone]  8:22 AM      [1] No Known Allergies  "

## 2024-09-26 NOTE — Plan of Care (Signed)
  Problem: Education: Goal: Knowledge of disease and its progression will improve Outcome: Progressing   

## 2024-09-26 NOTE — Plan of Care (Signed)

## 2024-09-26 NOTE — Hospital Course (Addendum)
 Catherine Aguilar is a 69 y.o.female with a history of HTN, T2DM, HLD, CVA, Stage 5 CKD (baseline creatinine 10-12 not on HD), anemia of chronic kidney disease (baseline hemoglobin 9-11), Wegener's granulomatosis, COPD (not on home oxygen)  who was admitted to the [facility] at [facility] for progressive SOB. Her hospital course is detailed below:  Volume overload Acute kidney injury superimposed on stage 5 chronic kidney disease, not on chronic dialysis  Patient presented to the ED with reports of months of SOB, but felt like it got worse over past few days prior to arrival.  She was previously seen at [facility] ED on 09/23/24 for blood transfusion for a Hgb of 6.4.  At this admission she reported that she had not taken her Torsemide for at least the last day because of urinary leakage/incontinence.  She also endorsed LE edema, L > R, x2 weeks.   CXR showed mild pulmonary edema, cardiomegaly, and aortic atherosclerosis, as well as remote right rib fractures.  Patient was diuresed with IV Lasix  and given steroids to help symptomatically.  Labs significant for Cr 14.8 and proBNP 4245.  Nephrology was consulted and patient was agreeable to proceeding with HD, IR placed temporary HD catheter. She had her first dialysis session 1/17 with good response. HD cath removed 1/22 due to it being the likely source of patient's bacteremia. TDC and AVF/G placed once bacteremia had resolved. New tunneled catheter has placed by Vasc Surg.  MRSA bacteremia Multifocal pneumonia Rapid response called 1/19 x2 for respiratory distress. Initiated empiric antibiotics with Vancomycin  (1/19 - 2/28) and Zosyn  (1/19-1/20) given CXR with possible PNA, leukocytosis and fever. MRSA swab positive and Bcx later grew MRSA. ID consulted who continued Vancomycin , discontinued Zosyn , and added PO Augmentin  (1/20-1/24) for PNA coverage. Most likely from temp HD catheter which was removed 1/21 to allow patient to clear infection.  Patient remained afebrile with improved leukocytosis at the time of discharge. Repeat Bcx showed clearance of MRSA. She was placed on Vancomycin  after the iHD access removal on 09/30/24 - 10/13/24 for the total of 4 weeks course. She will get IV Vanc during her HD session after discharge from the hospital.   Left vocal cord paralysis Rapid response called 1/19 while in dialysis for respiratory distress. Patient unable to tolerate BiPAP and maintained oxygen saturation with 2L [place]. CXR infiltrate concerning for pneumonia but otherwise no acute findings. Patient initiated on empiric abx with Zosyn  2.25 g q8hrs (1/19-1/20) and vancomycin  given CXR findings, leukocytosis and fever. CTA chest later did not show PE but did confirm PNA. Patient continued with increased WOB and unable to tolerate BiPAP though able to maintain oxygen saturation on 2L [place]. Rapid response called again overnight due to worsening respiratory status. ABG reassuring, showed improvement after racemic epi x1 and ativan  x2. Resting comfortably this morning with 2L . Given presentation of sx without acute findings on workup and improvement with anxiolytic, suspect vocal cord dysfunction may be playing a role. ENT was consulted and found L vocal cord paralysis. Possible this is a component of increased WOB, especially given patient's hx of Wegener's granulomatosis. CT neck with contrast showed medialization of left true vocal cord toward midline (paralysis vs palsy) w/ intact Vagus n. Per ENT no additional interventions needed at this time unless airway compromise which will likely require intubation then trach. ENT will f/u patient outpatient.   Anemia of chronic kidney failure, stage 5 Patient was seen at [facility] ED for a blood transfusion  at a Hgb of 6.4 on 1/14.  Hgb was 8 on arrival this admission.  Anemia workup was consistent with anemia of CKD. Hgb dropped to 6.6, improved to 7.7 after 1u PRBCs. Last blood transfusion was on 10/07/24  Nephrology redosed ESA. Patient was followed with daily CBCs. Dialysis was initiated as above.  Elevated troponin likely 2/2 demand ischemia Initial troponin elevated at 401, down trended to 351 > 331.  Cardiology consulted and recommended repeat echocardiogram which revealed EF 70-75%, hyperdynamic function. Consider slower volume pulls with HD given hyperdynamic function per cardiology. Suspect that troponin elevated in the setting of demand ischemia.   Superficial venous thrombosis of L small saphenous vein Elevated d-dimer D-dimer elevated at 2.08.  Patient was initially placed on a heparin  gtt, however Hgb did drop and this was held.  DVT US  performed and revealed chronic SVT of L small saphenous vein. Heparin  gtt was added back but no indication for chronic anticoagulation per VVS.  COPD (chronic obstructive pulmonary disease) Patient presented with wheezing which she stated on admission was at baseline; also reported that home albuterol  and Breztri  inhalers are ineffective.  She did receive DuoNeb in the ED with improvement and as needed throughout hospital course.  Hypertension Given elevated pressures on admission, cardiology recommended stopping home labetalol  in addition to adding hydralazine  and carvedilol . Home amlodipine  and doxazosin  were continued. Pressures remained stable thereafter.  Other chronic conditions were medically managed with home medications and formulary alternatives as necessary (T2DM, HLD, CAD)  PCP Follow-up Recommendations: Follow up on DME incontinence supplies. Encourage and reinforce importance of medication adherence. Follow up with [facility] in 6 weeks to check maturation of AVF. Consider follow up with cardiology for ischemic testing given elevated troponins 2/2 demand ischemia while admitted. Emphysema noted on CTA, consider low-dose CT for lung cancer screening Will need to reschedule her colonoscopy (missed due to hospitalization). Will need outpatient  ENT follow up outpatient for vocal cord paralysis (Dr. Roark, [facility]). May consider trach if persistent respiratory issues. May benefit from medialization procedure if persistent hoarseness.  GI recommends PPI 40 mg BID x2 months, then decrease to once daily.

## 2024-09-26 NOTE — Assessment & Plan Note (Signed)
 Seen at [facility] ED 09/23/2024, received 1 PRBC transfusion and eloped.  - Stable at 8 on admission, baseline 9-11 - Transfusion threshold of <8 - AM Iron , TIBC, ferritin, and reticulocytes

## 2024-09-26 NOTE — Assessment & Plan Note (Deleted)
*** ° °  2.08 on admission. Limited utility with patient history. LLE edema > R, but no erythema or tenderness. Most likely edema is 2/2 volume overload, but will get DVT US  to rule out clot. - Continue heparin  gtt on admission   - Low threshold to discontinue if bilateral DVT US  comes back negative - V/Q scan pending

## 2024-09-26 NOTE — Consult Note (Signed)
 Catherine Aguilar Admit Date: 09/25/2024 09/26/2024 Catherine Aguilar Requesting Physician:  Donzetta MD  Reason for Consult: CKD 5, uremia, azotemia, dyspnea HPI:  46F presented overnight to the ED with progressive exertional dyspnea.  PMH Incudes: CKD 5 followed at [facility] by Dr. Norine.  Last OV 09/07/2024 with noted ongoing progressive disease.  Patient has declined active preparation for dialysis including referrals to [facility]. Anemia of CKD with ESA order History of ANCA vasculitis treated in 2018 with steroids, rituximab  DM2 Hypertension  She noted subacute progression of exertional dyspnea prompting presentation to the ER overnight.  She has been on room air.  Portable chest x-ray demonstrated pulmonary edema, cardiomegaly.  Blood pressure is variable.  Treated with furosemide  now at 80 mg twice daily, Solu-Medrol , nebulizer.  In the ED her presenting BUN was 145, creatinine 14.8, potassium 4.0, bicarbonate 19.  Hemoglobin is 8.0.  Iron  saturation 26% with ferritin of 694.  Troponins are mildly elevated.  Cardiology consulted recommending dialysis, diuresis.  Patient acknowledges her longstanding hesitancy for active dialysis preparation for to initiate dialysis when previously recommended.  After further discussion today she is willing to begin hemodialysis.  She has fatigue, malaise, intermittent nausea.  No significant anorexia, dysgeusia, hiccups.   Creatinine, Ser (mg/dL)  Date Value  98/82/7973 14.40 (H)  09/25/2024 14.80 (H)  09/23/2024 16.00 (H)  09/23/2024 14.90 (H)  08/19/2024 13.23 (H)  08/18/2024 13.15 (H)  08/17/2024 12.60 (H)  08/17/2024 12.90 (H)  07/06/2024 12.18 (H)  05/20/2024 9.71 (H)  ]  ROS Balance of 12 systems is negative w/ exceptions as above  PMH  Past Medical History:  Diagnosis Date   Acute renal failure (ARF) 12/08/2016   Acute respiratory failure with hypoxia (HCC) 12/08/2016   AKI (acute kidney injury) 12/08/2016   Allergy    Anemia    Asthma  with COPD (chronic obstructive pulmonary disease) (HCC) 11/25/2017   CAP (community acquired pneumonia) 12/08/2016   Chronic kidney disease    CKD (chronic kidney disease), stage IV (HCC) 02/11/2017   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   CVA (cerebral vascular accident) (HCC) 07/03/2012   Diffuse pulmonary alveolar hemorrhage    Edema of right lower extremity 03/25/2017   Elevated d-dimer 09/26/2024   Essential hypertension 12/08/2016   Hemoptysis 12/08/2016   High cholesterol    HLD (hyperlipidemia) 12/08/2016   Hx of adenomatous polyp of colon 04/16/2017   Hypertension    stopped taking medications 10 years ago   Hypokalemia 07/02/2012   Intra-alveolar hemorrhage 12/08/2016   Left facial numbness 07/02/2012   Sepsis (HCC) 12/08/2016   Shortness of breath 12/02/2017   Stroke (HCC)    Substance abuse (HCC) 12/08/2016   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   Tobacco abuse 12/08/2016   Type 2 diabetes mellitus without complication, with long-term current use of insulin  (HCC) 02/11/2017   Wegener's granulomatosis with renal involvement (HCC) 12/08/2016   PSH  Past Surgical History:  Procedure Laterality Date   ABDOMINAL HYSTERECTOMY  1985   FH  Family History  Problem Relation Age of Onset   Heart attack Mother    Diabetes Mother    Hypertension Mother    Stroke Mother    Cancer Father    Alcohol abuse Father    Diabetes Father    Hypertension Father    Breast cancer Neg Hx    Colon cancer Neg Hx    Esophageal cancer Neg Hx    Rectal cancer Neg Hx    Stomach cancer Neg Hx  Pancreatic cancer Neg Hx    BRCA 1/2 Neg Hx    SH  reports that she quit smoking about 5 years ago. Her smoking use included cigarettes. She started smoking about 56 years ago. She has a 10.1 pack-year smoking history. She has never used smokeless tobacco. She reports current alcohol use. She reports current drug use. Drug: Marijuana. Allergies Allergies[1] Home medications Prior to  Admission medications  Medication Sig Start Date End Date Taking? Authorizing Provider  albuterol  (PROVENTIL ) (2.5 MG/3ML) 0.083% nebulizer solution Take 3 mLs (2.5 mg total) by nebulization every 6 (six) hours as needed for wheezing or shortness of breath. 11/06/20  Yes Lenon Lacks C, DO  albuterol  (VENTOLIN  HFA) 108 (90 Base) MCG/ACT inhaler INHALE 2 PUFFS BY MOUTH EVERY 4-6 HOURS Patient taking differently: Inhale 2 puffs into the lungs See admin instructions. INHALE 2 PUFFS INTO THE LUNGS EVERY 4-6 HOURS 06/29/24  Yes Hindel, Leah, MD  amLODipine  (NORVASC ) 10 MG tablet Take 10 mg by mouth at bedtime. 12/06/23  Yes [provider]  aspirin  81 MG EC tablet TAKE 1 TABLET BY MOUTH EVERY DAY Patient taking differently: Take 81 mg by mouth daily. 08/09/20  Yes Lenon Lacks C, DO  azelastine  (ASTELIN ) 0.1 % nasal spray Place 2 sprays into both nostrils 2 (two) times daily. Use in each nostril as directed Patient taking differently: Place 2 sprays into both nostrils 2 (two) times daily as needed for rhinitis or allergies. 06/26/24  Yes [name], MD  benzonatate  (TESSALON ) 200 MG capsule TAKE 1 CAPSULE (200 MG TOTAL) BY MOUTH 3 (THREE) TIMES DAILY AS NEEDED FOR COUGH. 09/25/24  Yes Hope Almarie ORN, NP  doxazosin  (CARDURA ) 2 MG tablet Take 2 mg by mouth in the morning and at bedtime. 10/31/23  Yes [provider]  insulin  glargine (LANTUS  SOLOSTAR) 100 UNIT/ML Solostar Pen Inject 5 Units into the skin at bedtime. 03/11/24  Yes Hindel, Leah, MD  labetalol  (NORMODYNE ) 300 MG tablet Take 300 mg by mouth in the morning and at bedtime. 12/06/23  Yes [provider]  pravastatin  (PRAVACHOL ) 40 MG tablet Take 40 mg by mouth at bedtime. 05/09/24  Yes [provider]  sodium bicarbonate  650 MG tablet Take 2 tablets (1,300 mg total) by mouth 2 (two) times daily. 11/06/20  Yes Lenon Lacks BROCKS, DO  torsemide (DEMADEX) 100 MG tablet Take 100 mg by mouth in the morning.  04/16/24  Yes [provider]  B-D UF III MINI PEN NEEDLES 31G X 5 MM MISC USE AS DIRECTED 12/16/23   Christia Budds, MD  budesonide -glycopyrrolate -formoterol  (BREZTRI  AEROSPHERE) 160-9-4.8 MCG/ACT AERO inhaler Inhale 2 puffs into the lungs in the morning and at bedtime. Patient not taking: Reported on 08/17/2024 08/14/24   Hope Almarie ORN, NP  fluticasone  (FLONASE ) 50 MCG/ACT nasal spray Place 2 sprays into both nostrils daily. Patient not taking: Reported on 08/17/2024 09/14/19   Hope Almarie ORN, NP  glucose blood (ACCU-CHEK GUIDE) test strip Check blood glucose prior to lantus  administration 04/18/23   Jagadish, Mayuri, MD  guaiFENesin  (ROBITUSSIN) 100 MG/5ML liquid Take 5 mLs by mouth every 4 (four) hours as needed for cough or to loosen phlegm. Patient not taking: No sig reported 12/12/23   Tharon Lung, MD  Lancets Misc. (ACCU-CHEK SOFTCLIX LANCET DEV) KIT Check BG prior to administering insulin  04/18/23   Christia Budds, MD  Spacer/Aero-Holding Raguel DEVI Please use with all inhaled medications 07/15/24   Adele Song, MD    Current Medications Scheduled Meds:  amLODipine   10 mg Oral Daily   budesonide -glycopyrrolate -formoterol   2 puff Inhalation Daily   carvedilol   12.5 mg Oral BID WC   doxazosin   2 mg Oral Daily   furosemide   80 mg Intravenous BID   hydrALAZINE   100 mg Oral Q8H   insulin  aspart  0-15 Units Subcutaneous TID WC   pravastatin   40 mg Oral QHS   sodium bicarbonate   1,300 mg Oral BID   Continuous Infusions: PRN Meds:.acetaminophen  **OR** acetaminophen , ipratropium-albuterol   CBC Recent Labs  Lab 09/23/24 1312 09/23/24 1335 09/25/24 2134 09/26/24 0343  WBC 8.6  --  12.0* 14.7*  NEUTROABS 6.4  --  9.3*  --   HGB 6.4* 7.8* 8.0* 7.3*  HCT 20.4* 23.0* 24.8* 22.8*  MCV 81.3  --  80.8 81.7  PLT 143*  --  151 133*   Basic Metabolic Panel Recent Labs  Lab 09/23/24 1312 09/23/24 1335 09/25/24 2134 09/26/24 0343  NA 140 139 138 136  K 4.1 4.0 4.0 4.4   CL 98 99 97* 96*  CO2 18*  --  19* 16*  GLUCOSE 98 95 124* 176*  BUN 145* >130* 145* 145*  CREATININE 14.90* 16.00* 14.80* 14.40*  CALCIUM  7.3*  --  7.7* 7.4*    Physical Exam  Blood pressure (!) 175/86, pulse 70, temperature 98.6 F (37 C), temperature source Oral, resp. rate 14, height 5' 7 (1.702 m), weight 83 kg, SpO2 98%. GEN: NAD ENT: NCAT EYES: EOMI CV: Regular, no murmur or rub PULM: Clear throughout, no crackles, normal work of breathing ABD: Soft, nontender SKIN: No rashes or lesions EXT: Trace to 1+ lower extremity edema NEURO: Conversant, oriented x3, no asterixis.  Nonfocal  Assessment 80F presenting with progressive exertional dyspnea and progressive CKD 5 with azotemia, uremia, and need for RRT.  New ESRD, driven by history of ANCA vasculitis, DM2, hypertension.  Patient now agrees to start dialysis. Exertional dyspnea likely with some acute HFpEF and hypervolemia started on furosemide  which I would continue pending dialysis. Persistent metabolic acidosis with elevated anion gap related to #1, dialysis will address.  Can continue oral bicarbonate but needs to be stopped after dialysis is started. Hypertension: Trend blood pressures with HD and UF Anemia: Relatively are replete with iron  saturation of 26%.  Start ESA with dialysis.  No indication for transfusion. DM2  Plan HD catheter ordered with interventional radiology First dialysis thereafter CLIP early next week.  She expresses interest to continue seeing her physicians at [facility]. Daily weights, Daily Renal Panel, Strict I/Os, Avoid nephrotoxins (NSAIDs, judicious IV Contrast)   Catherine Aguilar  [phone] pgr 09/26/2024, 8:36 AM        [1] No Known Allergies

## 2024-09-26 NOTE — Assessment & Plan Note (Addendum)
 As above, anemia workup consistent with anemia of chronic disease.  She has required blood transfusions in the past. Hgb dropped this AM so heparin  gtt held. - Transfusion threshold of <8 - AM CBC

## 2024-09-26 NOTE — Assessment & Plan Note (Addendum)
 2.08 on admission. Limited utility with patient history. LLE edema > R, but no erythema or tenderness. Most likely edema is 2/2 volume overload, but will get DVT US  to rule out clot. - Continue heparin  gtt on admission   - Low threshold to discontinue if bilateral DVT US  comes back negative - V/Q scan pending

## 2024-09-26 NOTE — Plan of Care (Signed)
 [facility] Brief Progress Note  S: Patient seen this morning sitting in bed comfortably.  She does continue to have some difficulty with her breathing, but denies any chest pain/pressure.  Denies pain or swelling in calves.  No other concerns.  O: BP (!) 175/86   Pulse 70   Temp 98.6 F (37 C) (Oral)   Resp 14   Ht 5' 7 (1.702 m)   Wt 83 kg   SpO2 98%   BMI 28.66 kg/m   General: Chronically ill-appearing, sitting in bed comfortably, no acute distress. HEENT: normocephalic, PERRLA, EOM grossly intact.  Nares patent. MMM. Cardio: Regular rate, regular rhythm, no murmurs on exam. Pulm: Good air movement throughout, though mild to moderate wheezing.  Speaks in full sentences without difficulty. Abdominal: bowel sounds present, soft, non-tender, non-distended. Extremities: Extremely mild edema to LLE. Moves all extremities equally. Neuro: Alert and oriented x3, speech normal in content, no facial asymmetry. Psych:  Cognition and judgment appear intact.   Medical Decision Making: Catherine Aguilar is a 69 y.o. F with PMHx of T2DM, HTN, CKD stage V, anemia of chronic disease, COPD, Wegener's granulomatosis with renal involvement who is admitted for CHF exacerbation.  She is diuresing well, and interested in starting HD. Assessment & Plan Acute kidney injury superimposed on stage 5 chronic kidney disease, not on chronic dialysis (HCC) Volume overload Cr remains elevated at 14.4; baseline appears to be 10-12. Has received total 100 mg IV Lasix  since arrival to the ED. Last echo 12/2023 with LVEF of 60-65%, no regional wall motion abnormalities, moderate LVH. - Nephrology consulted, appreciate recommendations. - Patient now agreeable to starting dialysis; HD catheter to be placed with IR today with first HD session following. - IV Lasix  80 mg twice daily - Echocardiogram pending - Tylenol  q6h PRN for pain  - Strict I&Os - Daily weights - AM CBC, RFP - Continue home sodium bicarbonate  1300  BID - Avoid nephrotoxic agents Elevated troponin Trended [id number].  Still with no reports of chest pain this morning. Continue to suspect demand ischemia, however will continue workup as appropriate. Anemia workup consistent with anemia of chronic disease. - Cardiology consulted, appreciate recommendations. - Continue to trend troponin - Repeat echocardiogram pending Elevated d-dimer 2.08 on admission; unclear the significance of this given patient history.  This morning she continues to be without erythema/tenderness to either calf.  Strongly suspect LE edema on admission secondary to volume overload as it has greatly improved today. - Will obtain DVT ultrasound, V/Q scan - Holding heparin  gtt due to decrease in Hgb 8.0>7.3 COPD (chronic obstructive pulmonary disease) (HCC) Patient reports wheezing at baseline, wheezing this AM unchanged. She is not currently requiring supplemental O2 and sats remain in high 90s. - Continue q4h DuoNebs PRN  - Continue home Breztri  inhaler 2 puffs daily  Anemia of chronic kidney failure, stage 5 (HCC) As above, anemia workup consistent with anemia of chronic disease.  She has required blood transfusions in the past. Hgb dropped this AM so heparin  gtt held. - Transfusion threshold of <8 - AM CBC Chronic health problem T2DM: hold nighttime Lantus  on admission, start moderate SSI HTN: continue home labetolol 300mg , amlodipine  10mg , and doxazosin  2mg  daily HLD: continue home pravastatin  40mg  daily    Lafe Domino, DO 09/26/2024, 9:13 AM PGY-2, [facility] Please page [phone] with questions.

## 2024-09-26 NOTE — Assessment & Plan Note (Addendum)
 Patient reports wheezing at baseline, wheezing this AM unchanged. She is not currently requiring supplemental O2 and sats remain in high 90s. - Continue q4h DuoNebs PRN  - Continue home Breztri  inhaler 2 puffs daily

## 2024-09-27 ENCOUNTER — Inpatient Hospital Stay (HOSPITAL_COMMUNITY)

## 2024-09-27 DIAGNOSIS — D72829 Elevated white blood cell count, unspecified: Secondary | ICD-10-CM | POA: Diagnosis not present

## 2024-09-27 DIAGNOSIS — N185 Chronic kidney disease, stage 5: Secondary | ICD-10-CM | POA: Diagnosis not present

## 2024-09-27 DIAGNOSIS — I2489 Other forms of acute ischemic heart disease: Secondary | ICD-10-CM | POA: Diagnosis not present

## 2024-09-27 DIAGNOSIS — Z992 Dependence on renal dialysis: Secondary | ICD-10-CM | POA: Diagnosis not present

## 2024-09-27 DIAGNOSIS — J189 Pneumonia, unspecified organism: Secondary | ICD-10-CM | POA: Diagnosis not present

## 2024-09-27 DIAGNOSIS — N179 Acute kidney failure, unspecified: Secondary | ICD-10-CM | POA: Diagnosis not present

## 2024-09-27 DIAGNOSIS — N186 End stage renal disease: Secondary | ICD-10-CM | POA: Diagnosis not present

## 2024-09-27 DIAGNOSIS — D649 Anemia, unspecified: Secondary | ICD-10-CM | POA: Diagnosis not present

## 2024-09-27 LAB — CBC
HCT: 22 % — ABNORMAL LOW (ref 36.0–46.0)
Hemoglobin: 7.3 g/dL — ABNORMAL LOW (ref 12.0–15.0)
MCH: 26 pg (ref 26.0–34.0)
MCHC: 33.2 g/dL (ref 30.0–36.0)
MCV: 78.3 fL — ABNORMAL LOW (ref 80.0–100.0)
Platelets: 164 K/uL (ref 150–400)
RBC: 2.81 MIL/uL — ABNORMAL LOW (ref 3.87–5.11)
RDW: 16.6 % — ABNORMAL HIGH (ref 11.5–15.5)
WBC: 13.6 K/uL — ABNORMAL HIGH (ref 4.0–10.5)
nRBC: 0.6 % — ABNORMAL HIGH (ref 0.0–0.2)

## 2024-09-27 LAB — GLUCOSE, CAPILLARY
Glucose-Capillary: 153 mg/dL — ABNORMAL HIGH (ref 70–99)
Glucose-Capillary: 75 mg/dL (ref 70–99)
Glucose-Capillary: 104 mg/dL — ABNORMAL HIGH (ref 70–99)
Glucose-Capillary: 123 mg/dL — ABNORMAL HIGH (ref 70–99)

## 2024-09-27 LAB — RENAL FUNCTION PANEL
Albumin: 3.9 g/dL (ref 3.5–5.0)
Anion gap: 20 — ABNORMAL HIGH (ref 5–15)
BUN: 110 mg/dL — ABNORMAL HIGH (ref 8–23)
CO2: 20 mmol/L — ABNORMAL LOW (ref 22–32)
Calcium: 8.4 mg/dL — ABNORMAL LOW (ref 8.9–10.3)
Chloride: 95 mmol/L — ABNORMAL LOW (ref 98–111)
Creatinine, Ser: 10.4 mg/dL — ABNORMAL HIGH (ref 0.44–1.00)
GFR, Estimated: 4 mL/min — ABNORMAL LOW
Glucose, Bld: 112 mg/dL — ABNORMAL HIGH (ref 70–99)
Phosphorus: 5.8 mg/dL — ABNORMAL HIGH (ref 2.5–4.6)
Potassium: 3.1 mmol/L — ABNORMAL LOW (ref 3.5–5.1)
Sodium: 135 mmol/L (ref 135–145)

## 2024-09-27 LAB — ECHOCARDIOGRAM COMPLETE
Area-P 1/2: 3.99 cm2
Height: 67 in
MV M vel: 5.05 m/s
MV Peak grad: 102 mmHg
S' Lateral: 2.9 cm
Single Plane A4C EF: 66 %
Weight: 2892.44 [oz_av]

## 2024-09-27 LAB — LIPID PANEL
Cholesterol: 109 mg/dL (ref 0–200)
HDL: 51 mg/dL
LDL Cholesterol: 35 mg/dL (ref 0–99)
Total CHOL/HDL Ratio: 2.1 ratio
Triglycerides: 115 mg/dL
VLDL: 23 mg/dL (ref 0–40)

## 2024-09-27 MED ORDER — HYDROMORPHONE HCL 1 MG/ML IJ SOLN
0.5000 mg | Freq: Once | INTRAMUSCULAR | Status: AC
  Administered 2024-09-27: 0.5 mg via INTRAVENOUS
  Filled 2024-09-27: qty 0.5

## 2024-09-27 MED ORDER — HEPARIN SODIUM (PORCINE) 5000 UNIT/ML IJ SOLN
5000.0000 [IU] | Freq: Three times a day (TID) | INTRAMUSCULAR | Status: DC
  Administered 2024-09-27 – 2024-09-28 (×3): 5000 [IU] via SUBCUTANEOUS
  Filled 2024-09-27 (×3): qty 1

## 2024-09-27 MED ORDER — HEPARIN SODIUM (PORCINE) 1000 UNIT/ML IJ SOLN
INTRAMUSCULAR | Status: AC
  Filled 2024-09-27: qty 4

## 2024-09-27 MED ORDER — IPRATROPIUM-ALBUTEROL 0.5-2.5 (3) MG/3ML IN SOLN
3.0000 mL | RESPIRATORY_TRACT | Status: DC
  Administered 2024-09-27 – 2024-09-30 (×14): 3 mL via RESPIRATORY_TRACT
  Filled 2024-09-27 (×13): qty 3

## 2024-09-27 MED ORDER — IPRATROPIUM-ALBUTEROL 0.5-2.5 (3) MG/3ML IN SOLN
3.0000 mL | RESPIRATORY_TRACT | Status: DC | PRN
  Administered 2024-09-28 – 2024-09-29 (×2): 3 mL via RESPIRATORY_TRACT
  Filled 2024-09-27: qty 3

## 2024-09-27 NOTE — Assessment & Plan Note (Addendum)
 Wheezing in all 4 quadrants on exam (present at baseline per patient). Currently satting well on RA. - Continue home Breztri  inhaler 2 puffs daily  - Continue DuoNebs q4hrs PRN  - Benzonatate  200 mg TID PRN for cough

## 2024-09-27 NOTE — Plan of Care (Signed)
" °  Problem: Education: Goal: Ability to describe self-care measures that may prevent or decrease complications (Diabetes Survival Skills Education) will improve Outcome: Progressing Goal: Individualized Educational Video(s) Outcome: Progressing   Problem: Coping: Goal: Ability to adjust to condition or change in health will improve Outcome: Progressing   Problem: Fluid Volume: Goal: Ability to maintain a balanced intake and output will improve Outcome: Progressing   Problem: Health Behavior/Discharge Planning: Goal: Ability to identify and utilize available resources and services will improve Outcome: Progressing Goal: Ability to manage health-related needs will improve Outcome: Progressing   Problem: Metabolic: Goal: Ability to maintain appropriate glucose levels will improve Outcome: Progressing   Problem: Nutritional: Goal: Maintenance of adequate nutrition will improve Outcome: Progressing Goal: Progress toward achieving an optimal weight will improve Outcome: Progressing   Problem: Skin Integrity: Goal: Risk for impaired skin integrity will decrease Outcome: Progressing   Problem: Tissue Perfusion: Goal: Adequacy of tissue perfusion will improve Outcome: Progressing   Problem: Education: Goal: Knowledge of disease and its progression will improve Outcome: Progressing   Problem: Health Behavior/Discharge Planning: Goal: Ability to manage health-related needs will improve Outcome: Progressing   Problem: Clinical Measurements: Goal: Complications related to the disease process or treatment will be avoided or minimized Outcome: Progressing Goal: Dialysis access will remain free of complications Outcome: Progressing   Problem: Activity: Goal: Activity intolerance will improve Outcome: Progressing   Problem: Fluid Volume: Goal: Fluid volume balance will be maintained or improved Outcome: Progressing   Problem: Nutritional: Goal: Ability to make appropriate  dietary choices will improve Outcome: Progressing   Problem: Respiratory: Goal: Respiratory symptoms related to disease process will be avoided Outcome: Progressing   Problem: Self-Concept: Goal: Body image disturbance will be avoided or minimized Outcome: Progressing   Problem: Urinary Elimination: Goal: Progression of disease will be identified and treated Outcome: Progressing   Problem: Education: Goal: Knowledge of General Education information will improve Description: Including pain rating scale, medication(s)/side effects and non-pharmacologic comfort measures Outcome: Progressing   Problem: Health Behavior/Discharge Planning: Goal: Ability to manage health-related needs will improve Outcome: Progressing   Problem: Clinical Measurements: Goal: Ability to maintain clinical measurements within normal limits will improve Outcome: Progressing Goal: Will remain free from infection Outcome: Progressing Goal: Diagnostic test results will improve Outcome: Progressing Goal: Respiratory complications will improve Outcome: Progressing Goal: Cardiovascular complication will be avoided Outcome: Progressing   Problem: Activity: Goal: Risk for activity intolerance will decrease Outcome: Progressing   Problem: Nutrition: Goal: Adequate nutrition will be maintained Outcome: Progressing   Problem: Coping: Goal: Level of anxiety will decrease Outcome: Progressing   Problem: Elimination: Goal: Will not experience complications related to bowel motility Outcome: Progressing Goal: Will not experience complications related to urinary retention Outcome: Progressing   Problem: Pain Managment: Goal: General experience of comfort will improve and/or be controlled Outcome: Progressing   Problem: Safety: Goal: Ability to remain free from injury will improve Outcome: Progressing   Problem: Skin Integrity: Goal: Risk for impaired skin integrity will decrease Outcome:  Progressing   "

## 2024-09-27 NOTE — Consult Note (Signed)
 "                                   Vascular and Vein Specialist of [facility]  Patient name: Catherine Aguilar MRN: [id number] DOB: 1956-09-02 Sex: female   REQUESTING PROVIDER:    Dr. Marlee   REASON FOR CONSULT:    Dialysis access  HISTORY OF PRESENT ILLNESS:   Catherine Aguilar is a 69 y.o. female, who presented to the hospital with increasing shortness of breath..  She was having volume overload secondary to end-stage renal disease and medication noncompliance.  She has not had any prior access.  She is have some type 2 diabetes as well as hypertension.  She is right-handed.  She denies any upper extremity edema.  She has not had any medical devices placed including pacemaker or defibrillator.  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   CVA (cerebral vascular accident) (HCC) 07/03/2012   Diffuse pulmonary alveolar hemorrhage    Edema of right lower extremity 03/25/2017   Elevated d-dimer 09/26/2024   Essential hypertension 12/08/2016   Hemoptysis 12/08/2016   High cholesterol    HLD (hyperlipidemia) 12/08/2016   Hx of adenomatous polyp of colon 04/16/2017   Hypertension    stopped taking medications 10 years ago   Hypokalemia 07/02/2012   Intra-alveolar hemorrhage 12/08/2016   Left facial numbness 07/02/2012   Sepsis (HCC) 12/08/2016   Shortness of breath 12/02/2017   Stroke (HCC)    Substance abuse (HCC) 12/08/2016   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   Tobacco abuse 12/08/2016   Type 2 diabetes mellitus without complication, with  long-term current use of insulin  (HCC) 02/11/2017   Wegener's granulomatosis with renal involvement (HCC) 12/08/2016     FAMILY HISTORY   Family History  Problem Relation Age of Onset   Heart attack Mother    Diabetes Mother    Hypertension Mother    Stroke Mother    Cancer Father    Alcohol abuse Father    Diabetes Father    Hypertension Father    Breast cancer Neg Hx    Colon cancer Neg Hx    Esophageal cancer Neg Hx    Rectal cancer Neg Hx    Stomach cancer Neg Hx    Pancreatic cancer Neg Hx    BRCA 1/2 Neg Hx     SOCIAL HISTORY:   Social History   Socioeconomic History   Marital status: Married    Spouse name: Not on file   Number of children: Not on file   Years of education: Not on file   Highest education level: Not on file  Occupational History   Not on file  Tobacco Use   Smoking status: Former    Current packs/day: 0.00    Average packs/day: 0.2 packs/day for 50.5 years (10.1 ttl pk-yrs)    Types: Cigarettes    Start date: 09/10/1968    Quit date: 03/25/2019    Years since quitting: 5.5   Smokeless tobacco: Never  Vaping Use   Vaping status: Never Used  Substance and Sexual Activity   Alcohol use: Yes    Comment: on the weekends - moderately per pt   Drug use: Yes    Types: Marijuana    Comment: Friday, Saturday, Sunday   Sexual activity: Not Currently  Other Topics Concern   Not on file  Social History Narrative   Not on file   Social Drivers of Health   Tobacco Use: Medium Risk (09/25/2024)   Patient History    Smoking Tobacco Use: Former    Smokeless Tobacco Use: Never    Passive Exposure: Not on file  Financial Resource Strain: Low Risk (06/11/2024)   Overall Financial Resource Strain (CARDIA)    Difficulty of Paying Living Expenses: Not hard at all  Food Insecurity: No Food Insecurity (09/26/2024)   Epic    Worried About Radiation Protection Practitioner of Food in the Last Year: Never true    Ran Out of Food in the Last Year: Never true  Transportation  Needs: No Transportation Needs (09/26/2024)   Epic    Lack of Transportation (Medical): No    Lack of Transportation (Non-Medical): No  Physical Activity: Inactive (06/11/2024)   Exercise Vital Sign    Days of Exercise per Week: 0 days    Minutes of Exercise per Session: 0 min  Stress: No Stress Concern Present (06/11/2024)   [employer] of Occupational Health - Occupational Stress Questionnaire    Feeling of Stress: Not at all  Social Connections: Moderately Integrated (09/26/2024)   Social Connection and Isolation Panel    Frequency of Communication with Friends and Family: Three times a week    Frequency of Social Gatherings with Friends and Family: Three times a week    Attends Religious Services: More than 4 times per year    Active Member of Clubs or Organizations: No    Attends Banker Meetings: Never    Marital Status: Married  Catering Manager Violence: Not At Risk (09/26/2024)   Epic    Fear of Current or Ex-Partner: No    Emotionally Abused: No    Physically Abused: No    Sexually Abused: No  Depression (PHQ2-9): Low Risk (07/15/2024)   Depression (PHQ2-9)    PHQ-2 Score: 1  Alcohol Screen: Low Risk (05/21/2023)   Alcohol Screen    Last Alcohol Screening Score (AUDIT): 2  Housing: Low Risk (09/26/2024)   Epic    Unable to Pay for Housing in the Last Year: No    Number of Times Moved in the Last Year: 1    Homeless in the Last Year: No  Utilities: Not At Risk (09/26/2024)   Epic    Threatened with loss of utilities: No  Health Literacy: Adequate Health Literacy (06/11/2024)   B1300 Health Literacy    Frequency of need for help with medical instructions: Never    ALLERGIES:    Allergies[1]  CURRENT MEDICATIONS:    Current Facility-Administered Medications  Medication Dose Route Frequency Provider Last Rate Last Admin   acetaminophen  (TYLENOL ) tablet 650 mg  650 mg Oral Q6H PRN Lupie Credit, DO   650 mg at 09/27/24 0935   Or   acetaminophen   (TYLENOL ) suppository 650 mg  650 mg Rectal Q6H PRN Lupie Credit, DO       amLODipine  (NORVASC ) tablet 10 mg  10 mg Oral Daily Majeed, Sara, DO   10 mg at 09/27/24 9072   benzonatate  (TESSALON ) capsule 200 mg  200 mg Oral  TID PRN Adele Song, MD   200 mg at 09/26/24 2037   budesonide -glycopyrrolate -formoterol  (BREZTRI ) 160-9-4.8 MCG/ACT inhaler 2 puff  2 puff Inhalation Daily Lupie Credit, DO   2 puff at 09/27/24 9261   carvedilol  (COREG ) tablet 12.5 mg  12.5 mg Oral BID WC Barbaraann Darryle Ned, MD   12.5 mg at 09/27/24 9072   Chlorhexidine  Gluconate Cloth 2 % PADS 6 each  6 each Topical Daily Donzetta Rollene BRAVO, MD   6 each at 09/27/24 1001   doxazosin  (CARDURA ) tablet 2 mg  2 mg Oral Daily Majeed, Sara, DO   2 mg at 09/27/24 9072   heparin  injection 5,000 Units  5,000 Units Subcutaneous Q8H Bhagat, Virali, DO   5,000 Units at 09/27/24 1449   hydrALAZINE  (APRESOLINE ) tablet 100 mg  100 mg Oral Q8H Barbaraann Darryle Ned, MD   100 mg at 09/27/24 1449   insulin  aspart (novoLOG ) injection 0-15 Units  0-15 Units Subcutaneous TID WC Lupie Credit, DO   3 Units at 09/27/24 0932   ipratropium-albuterol  (DUONEB) 0.5-2.5 (3) MG/3ML nebulizer solution 3 mL  3 mL Nebulization Q2H PRN Bhagat, Virali, DO       ipratropium-albuterol  (DUONEB) 0.5-2.5 (3) MG/3ML nebulizer solution 3 mL  3 mL Nebulization Q4H Bhagat, Virali, DO   3 mL at 09/27/24 1254   Oral care mouth rinse  15 mL Mouth Rinse PRN Donzetta Rollene BRAVO, MD       pravastatin  (PRAVACHOL ) tablet 40 mg  40 mg Oral QHS Majeed, Sara, DO       sodium chloride  flush (NS) 0.9 % injection 10-40 mL  10-40 mL Intracatheter Q12H Pray, Rollene BRAVO, MD       sodium chloride  flush (NS) 0.9 % injection 10-40 mL  10-40 mL Intracatheter PRN Pray, Rollene BRAVO, MD        REVIEW OF SYSTEMS:   [X]  denotes positive finding, [ ]  denotes negative finding Cardiac  Comments:  Chest pain or chest pressure:    Shortness of breath upon exertion: x   Short of breath when lying  flat: x   Irregular heart rhythm:        Vascular    Pain in calf, thigh, or hip brought on by ambulation:    Pain in feet at night that wakes you up from your sleep:     Blood clot in your veins:    Leg swelling:         Pulmonary    Oxygen at home:    Productive cough:     Wheezing:         Neurologic    Sudden weakness in arms or legs:     Sudden numbness in arms or legs:     Sudden onset of difficulty speaking or slurred speech:    Temporary loss of vision in one eye:     Problems with dizziness:         Gastrointestinal    Blood in stool:      Vomited blood:         Genitourinary    Burning when urinating:     Blood in urine:        Psychiatric    Major depression:         Hematologic    Bleeding problems:    Problems with blood clotting too easily:        Skin    Rashes or ulcers:        Constitutional  Fever or chills:     PHYSICAL EXAM:   Vitals:   09/27/24 0927 09/27/24 1200 09/27/24 1449 09/27/24 1619  BP: (!) 141/64  (!) 141/64 128/65  Pulse: 62   67  Resp:    18  Temp:  98.5 F (36.9 C)  98.1 F (36.7 C)  TempSrc:      SpO2:    95%  Weight:      Height:        GENERAL: The patient is a well-nourished female, in no acute distress. The vital signs are documented above. CARDIAC: There is a regular rate and rhythm.  VASCULAR: Palpable radial pulse bilaterally PULMONARY: Nonlabored respirations ABDOMEN: Soft and non-tender  MUSCULOSKELETAL: There are no major deformities or cyanosis. NEUROLOGIC: No focal weakness or paresthesias are detected. SKIN: There are no ulcers or rashes noted. PSYCHIATRIC: The patient has a normal affect.  STUDIES:   Vein mapping has been ordered  ASSESSMENT and PLAN   ESRD: The patient just completed her first dialysis.  She has a temporary catheter placed in the right neck.  She is right-handed.  We discussed proceeding with left arm access.  This will either be a graft or fistula pending her vein mapping.   I will have further discussions with her once vein mapping has been resulted.  She will also need conversion of her temporary catheter to a tunneled catheter.  I anticipate her surgery being on Tuesday or Wednesday.  She does have an IV in her left arm which will need to be moved   Catherine Aguilar, IV, MD, FACS [facility] Specialists of [facility] [phone] Pager [phone]     [1] No Known Allergies  "

## 2024-09-27 NOTE — Assessment & Plan Note (Addendum)
 Improving, BP today 141/64. - Cardiology consulted, appreciate recommendations - Stopped home labetalol  300mg  daily given poor BP control - Started Carvedilol  12.5 mg BID and Hydralazine  100 mg TID - Continue home Amlodipine  10 mg daily and Doxazosin  2 mg daily

## 2024-09-27 NOTE — Progress Notes (Addendum)
 " Cardiology Progress Note  Patient ID: Catherine Aguilar MRN: [id number] DOB: 1956-03-28 Date of Encounter: 09/27/2024 Primary Cardiologist: None  Subjective   Chief Complaint: Started on HD.   HPI: She was upset she started dialysis.  Volume status is improved.  Blood pressures improved.  ROS:  All other ROS reviewed and negative. Pertinent positives noted in the HPI.     Telemetry  Overnight telemetry shows sinus rhythm 70s, which I personally reviewed.     Physical Exam   Vitals:   09/27/24 0200 09/27/24 0247 09/27/24 0526 09/27/24 0927  BP: (!) 177/79 (!) 173/72 (!) 146/66 (!) 141/64  Pulse: (!) 55 70 63 62  Resp: 12 20 18    Temp: 97.7 F (36.5 C) 97.6 F (36.4 C) 98.2 F (36.8 C)   TempSrc: Oral     SpO2: 97% 96% 98%   Weight: 82 kg     Height:        Intake/Output Summary (Last 24 hours) at 09/27/2024 1015 Last data filed at 09/27/2024 0644 Gross per 24 hour  Intake 180 ml  Output 1300 ml  Net -1120 ml       09/27/2024    2:00 AM 09/26/2024   11:45 PM 09/25/2024    9:26 PM  Last 3 Weights  Weight (lbs) 180 lb 12.4 oz 182 lb 15.7 oz 183 lb  Weight (kg) 82 kg 83 kg 83.008 kg    Body mass index is 28.31 kg/m.  General: Well nourished, well developed, in no acute distress Head: Atraumatic, normal size  Eyes: PEERLA, EOMI  Neck: Supple, no JVD Endocrine: No thryomegaly Cardiac: Normal S1, S2; RRR; no murmurs, rubs, or gallops Lungs: Clear to auscultation bilaterally, no wheezing, rhonchi or rales  Abd: Soft, nontender, no hepatomegaly  Ext: Trace edema Musculoskeletal: No deformities, BUE and BLE strength normal and equal Skin: Warm and dry, no rashes   Neuro: Alert and oriented to person, place, time, and situation, CNII-XII grossly intact, no focal deficits  Psych: Normal mood and affect   Cardiac Studies  TTE 12/11/2023  1. Left ventricular ejection fraction, by estimation, is 60 to 65%. The  left ventricle has normal function. The left ventricle has no  regional  wall motion abnormalities. There is moderate left ventricular hypertrophy.  Left ventricular diastolic  parameters are indeterminate.   2. Right ventricular systolic function is normal. The right ventricular  size is normal.   3. Left atrial size was mildly dilated.   4. A small pericardial effusion is present.   5. The mitral valve is normal in structure. Trivial mitral valve  regurgitation. No evidence of mitral stenosis.   6. The aortic valve is tricuspid. Aortic valve regurgitation is not  visualized. Aortic valve sclerosis/calcification is present, without any  evidence of aortic stenosis.   7. The inferior vena cava is normal in size with greater than 50%  respiratory variability, suggesting right atrial pressure of 3 mmHg.   Patient Profile  Donella Pascarella is a 69 y.o. female with CKD stage V, Wegener's granulomatosis, diabetes, COPD admitted on 09/26/2024 for acute hypoxic respiratory failure and volume overload secondary to likely worsening kidney dysfunction.  She has been started on hemodialysis.  Assessment & Plan   # Acute hypoxic respiratory failure secondary to pulmonary edema # ESRD now on hemodialysis - Volume status much improved with hemodialysis.  Will continue further management per nephrology.  Her echo is pending.  If her EF is normal she will need no further cardiology follow-up.  Cardiology to follow-up her echo remotely.  # Elevated troponin, demand - Secondary to volume overload.  No CP reported. EKG non-ischemic. Not ACS.  - On pravastatin .  LDL at goal.  No aspirin  given anemia requiring recurrent transfusion.  # HTN - BPs are much improved.  Continue hydralazine  100 mg 3 times daily, amlodipine  10 mg daily, carvedilol  12.5 mg twice daily.  Further titration per medical team.  # Anemia - Secondary to kidney disease.   For questions or updates, please contact [facility] Please consult [email or web address] for contact info under         Signed, Darryle T. Barbaraann, MD, [facility] [place]  [facility]  09/27/2024 10:15 AM   "

## 2024-09-27 NOTE — Progress Notes (Signed)
" °  Echocardiogram 2D Echocardiogram has been performed.  Koleen KANDICE Popper, RDCS 09/27/2024, 11:01 AM "

## 2024-09-27 NOTE — Progress Notes (Signed)
 "    Daily Progress Note Intern Pager: [phone]  Patient name: Aracelys Glade Medical record number: [id number] Date of birth: 11-23-1955 Age: 69 y.o. Gender: female  Primary Care Provider: Adele Song, MD Consultants: Cardiology Code Status: Full code  Pt Overview and Major Events to Date:  1/17 - admitted, temp HD catheter placed 1/18 - initiated dialysis  Assessment and Plan: Taleigha Pinson is a 69 y.o. F with PMHx of T2DM, HTN, CKD stage V, anemia of chronic disease, COPD, Wegener's granulomatosis with renal involvement who is admitted for CHF exacerbation.  She is diuresing well, and now s/p dialysis x1.  Assessment & Plan Acute kidney injury superimposed on stage 5 chronic kidney disease, not on chronic dialysis (HCC) Volume overload Cr 14.4 > 10.4 after dialysis x1 (baseline 10-12). 1300 total output, weight down 1kg. Still complaining of pain at HD cath site. - Nephrology consulted, appreciate recommendations. - IV Lasix  80 mg BID - Echocardiogram pending - Tylenol  650 mg q6hrs PRN PO or rectal for pain - Continue home sodium bicarbonate  1300 mg BID - Avoid nephrotoxic agents - Strict I&Os, daily weights - AM CBC, RFP Elevated troponin Elevated to 401 on admission, though trended flat ~350. LDL 35. Likely 2/2 demand ischemia from volume overload. Denies CP.  - Cardiology consulted, appreciate recommendations. - Holding off on ASA in the setting of anemia requiring recurrent transfusions per cardiology Elevated d-dimer 2.08 on admission; unclear the significance of this given patient history. No erythema, pain or edema in either LE on exam. Likely 2/2 volume overload but will workup as below.  - Pending DVT ultrasound, V/Q scan - Heparin  added back given low suspicion for bleed COPD (chronic obstructive pulmonary disease) (HCC) Wheezing in all 4 quadrants on exam (present at baseline per patient). Currently satting well on RA. - Continue home Breztri  inhaler 2 puffs  daily  - Continue DuoNebs q4hrs PRN  - Benzonatate  200 mg TID PRN for cough Anemia of chronic kidney failure, stage 5 (HCC) Hgb stable at 7.3. HShe has required blood transfusions in the past. - Transfusion threshold of <8 - AM CBC Essential hypertension Improving, BP today 141/64. - Cardiology consulted, appreciate recommendations - Stopped home labetalol  300mg  daily given poor BP control - Started Carvedilol  12.5 mg BID and Hydralazine  100 mg TID - Continue home Amlodipine  10 mg daily and Doxazosin  2 mg daily Chronic health problem T2DM: hold nighttime Lantus  on admission, start moderate SSI HLD: continue home Pravastatin  40 mg daily   FEN/GI: Renal with fluid restriction PPx: Heparin  added back Dispo: Pending clinical improvement   Subjective:  Patient reports improvement after admission. Doing well after dialysis overnight. Still complaining of pain at HD cath site.   IV team at bedside at time of my exam. Patient with bleeding around dressing at site of HD cath. Dressing needs to remain in place 48hrs but considering dressing change.  Objective: Temp:  [97 F (36.1 C)-98.6 F (37 C)] 98.2 F (36.8 C) (01/18 0526) Pulse Rate:  [55-80] 63 (01/18 0526) Resp:  [11-25] 18 (01/18 0526) BP: (118-186)/(58-100) 146/66 (01/18 0526) SpO2:  [95 %-100 %] 98 % (01/18 0526) Weight:  [82 kg-83 kg] 82 kg (01/18 0200)  Physical Exam: General: Alert, in NAD.  Neck: Temp HD catheter in place with bleeding noted around dressing Cardiovascular: RRR, systolic murmur appreciated Pulmonary: Good air movement though mild wheezing heard in all four quadrants.  Abdomen: Soft, non-tender, non-distended. Extremities: Warm and well-perfused, without cyanosis or edema. Neurologic: No focal deficits  Laboratory: Most recent CBC Lab Results  Component Value Date   WBC 13.6 (H) 09/27/2024   HGB 7.3 (L) 09/27/2024   HCT 22.0 (L) 09/27/2024   MCV 78.3 (L) 09/27/2024   PLT 164 09/27/2024    Most recent BMP    Latest Ref Rng & Units 09/27/2024    4:01 AM  BMP  Glucose 70 - 99 mg/dL 887   BUN 8 - 23 mg/dL 889   Creatinine 9.55 - 1.00 mg/dL 89.59   Sodium 864 - 854 mmol/L 135   Potassium 3.5 - 5.1 mmol/L 3.1   Chloride 98 - 111 mmol/L 95   CO2 22 - 32 mmol/L 20   Calcium  8.9 - 10.3 mg/dL 8.4    Other pertinent labs: LDL 35  [name], DO 09/27/2024, 7:16 AM  PGY-1, [facility] FPTS Intern pager: [phone], text pages welcome Secure chat group [facility] Teaching Service   "

## 2024-09-27 NOTE — Assessment & Plan Note (Addendum)
 2.08 on admission; unclear the significance of this given patient history. No erythema, pain or edema in either LE on exam. Likely 2/2 volume overload but will workup as below.  - Pending DVT ultrasound, V/Q scan - Heparin  added back given low suspicion for bleed

## 2024-09-27 NOTE — Assessment & Plan Note (Addendum)
 Elevated to 401 on admission, though trended flat ~350. LDL 35. Likely 2/2 demand ischemia from volume overload. Denies CP.  - Cardiology consulted, appreciate recommendations. - Holding off on ASA in the setting of anemia requiring recurrent transfusions per cardiology

## 2024-09-27 NOTE — Progress Notes (Signed)
 Received patient in bed to unit.  Alert and oriented.  Informed consent signed and in chart.   TX duration: 2  Patient tolerated well.  Transported back to the room  Alert, without acute distress.  Hand-off given to patient's nurse.   Access used: Dialysis Catheter Access issues: None  Total UF removed: 1000 Medication(s) given: None Post HD VS: T97.7-HR55-B/p177/79 Post HD weight: 82kg  Neville Seip, RN Kid[facility] Dialysis Unit   09/27/24 0200  Vitals  Temp 97.7 F (36.5 C)  Temp Source Oral  BP (!) 177/79  MAP (mmHg) 105  BP Location Right Arm  BP Method Automatic  Patient Position (if appropriate) Lying  Pulse Rate (!) 55  Pulse Rate Source Monitor  ECG Heart Rate (!) 59  Resp 12  Weight 82 kg  Type of Weight Post-Dialysis  Oxygen Therapy  SpO2 97 %  O2 Device Room Air  Patient Activity (if Appropriate) In bed  Pulse Oximetry Type Continuous  During Treatment Monitoring  Blood Flow Rate (mL/min) 0 mL/min  Arterial Pressure (mmHg) -45.86 mmHg  Venous Pressure (mmHg) 34.14 mmHg  TMP (mmHg) 12.32 mmHg  Ultrafiltration Rate (mL/min) 800 mL/min  Dialysate Flow Rate (mL/min) 299 ml/min  Dialysate Potassium Concentration 3  Dialysate Calcium  Concentration 2.5  Duration of HD Treatment -hour(s) 2 hour(s)  Cumulative Fluid Removed (mL) per Treatment  1000.15  HD Safety Checks Performed Yes  Intra-Hemodialysis Comments Tolerated well;Tx completed  Post Treatment  Dialyzer Clearance Clear  Liters Processed 30.7  Fluid Removed (mL) 1000 mL  Tolerated HD Treatment Yes  Hemodialysis Catheter Right Internal jugular Triple lumen Temporary (Non-Tunneled)  Placement Date/Time: 09/26/24 1808   Placed prior to admission: No  Serial / Lot #: [id number]  Expiration Date: 04/09/28  Time Out: Correct patient;Correct site;Correct procedure  Maximum sterile barrier precautions: Cap;Mask;Hand hygiene;Sterile gown;...  Site Condition No complications  Blue Lumen Status  Flushed;Antimicrobial dead end cap;Heparin  locked  Red Lumen Status Flushed;Antimicrobial dead end cap;Heparin  locked  Purple Lumen Status N/A  Catheter fill solution Heparin  1000 units/ml  Catheter fill volume (Arterial) 1.3 cc  Catheter fill volume (Venous) 1.3  Dressing Type Transparent  Dressing Status Antimicrobial disc/dressing in place;Clean, Dry, Intact  Interventions Dressing reinforced  Drainage Description None  Dressing Change Due 10/03/24  Post treatment catheter status Capped and Clamped

## 2024-09-27 NOTE — Plan of Care (Signed)
  Problem: Education: Goal: Ability to describe self-care measures that may prevent or decrease complications (Diabetes Survival Skills Education) will improve Outcome: Progressing Goal: Individualized Educational Video(s) Outcome: Not Applicable

## 2024-09-27 NOTE — Assessment & Plan Note (Addendum)
 Cr 14.4 > 10.4 after dialysis x1 (baseline 10-12). 1300 total output, weight down 1kg. Still complaining of pain at HD cath site. - Nephrology consulted, appreciate recommendations. - IV Lasix  80 mg BID - Echocardiogram pending - Tylenol  650 mg q6hrs PRN PO or rectal for pain - Continue home sodium bicarbonate  1300 mg BID - Avoid nephrotoxic agents - Strict I&Os, daily weights - AM CBC, RFP

## 2024-09-27 NOTE — Assessment & Plan Note (Addendum)
 Hgb stable at 7.3. HShe has required blood transfusions in the past. - Transfusion threshold of <8 - AM CBC

## 2024-09-27 NOTE — Progress Notes (Signed)
 Admit: 09/25/2024 LOS: 1  56F presenting with progressive exertional dyspnea and progressive CKD 5 with azotemia, uremia, and need for RRT.   Subjective:  Temp cath with IR yesterday, appreciated First HD treatment yesterday evening, tolerated well No complaints this morning On room air, blood pressure stable  01/17 0701 - 01/18 0700 In: 180 [P.O.:180] Out: 1300 [Urine:300]  Filed Weights   09/25/24 2126 09/26/24 2345 09/27/24 0200  Weight: 83 kg 83 kg 82 kg    Scheduled Meds:  amLODipine   10 mg Oral Daily   budesonide -glycopyrrolate -formoterol   2 puff Inhalation Daily   carvedilol   12.5 mg Oral BID WC   Chlorhexidine  Gluconate Cloth  6 each Topical Daily   doxazosin   2 mg Oral Daily   furosemide   80 mg Intravenous BID   heparin   5,000 Units Subcutaneous Q8H   hydrALAZINE   100 mg Oral Q8H   insulin  aspart  0-15 Units Subcutaneous TID WC   pravastatin   40 mg Oral QHS   sodium bicarbonate   1,300 mg Oral BID   sodium chloride  flush  10-40 mL Intracatheter Q12H   Continuous Infusions: PRN Meds:.acetaminophen  **OR** acetaminophen , benzonatate , ipratropium-albuterol , mouth rinse, sodium chloride  flush  Current Labs: reviewed    Physical Exam:  Blood pressure (!) 141/64, pulse 62, temperature 98.2 F (36.8 C), resp. rate 18, height 5' 7 (1.702 m), weight 82 kg, SpO2 97%. GEN: NAD ENT: NCAT EYES: EOMI CV: Regular, no murmur or rub PULM: Clear throughout, no crackles, normal work of breathing ABD: Soft, nontender SKIN: No rashes or lesions EXT: Trace to 1+ lower extremity edema NEURO: Conversant, oriented x3, no asterixis.  Nonfocal R internal jugular Temp HD cath noted  A New ESRD, driven by distant history of ANCA vasculitis, DM2, hypertension.  HD started 1/17 using temp HD catheter. Exertional dyspnea likely with some acute HFpEF and hypervolemia; improved today.  Stop furosemide  Persistent metabolic acidosis with elevated anion gap related to #1, dialysis will  address.  Stop sodium bicarbonate  Hypertension: Better today Anemia: Relatively are replete with iron  saturation of 26%.  Received ESA 1/14 as outpatient DM2  P VVS consult for [facility] and AVF/G Initiate outpatient HD clinic tomorrow; patient wishes to stay with her [facility] doctors HD #2 tomorrow Medication Issues; Preferred narcotic agents for pain control are hydromorphone , fentanyl , and methadone. Morphine  should not be used.  Baclofen should be avoided Avoid oral sodium phosphate  and magnesium  citrate based laxatives / bowel preps    Bernardino Gasman MD 09/27/2024, 11:31 AM  Recent Labs  Lab 09/25/24 2134 09/26/24 0343 09/27/24 0401  NA 138 136 135  K 4.0 4.4 3.1*  CL 97* 96* 95*  CO2 19* 16* 20*  GLUCOSE 124* 176* 112*  BUN 145* 145* 110*  CREATININE 14.80* 14.40* 10.40*  CALCIUM  7.7* 7.4* 8.4*  PHOS  --   --  5.8*   Recent Labs  Lab 09/23/24 1312 09/23/24 1335 09/25/24 2134 09/26/24 0343 09/27/24 0401  WBC 8.6  --  12.0* 14.7* 13.6*  NEUTROABS 6.4  --  9.3*  --   --   HGB 6.4*   < > 8.0* 7.3* 7.3*  HCT 20.4*   < > 24.8* 22.8* 22.0*  MCV 81.3  --  80.8 81.7 78.3*  PLT 143*  --  151 133* 164   < > = values in this interval not displayed.

## 2024-09-27 NOTE — Plan of Care (Signed)
 " Problem: Education: Goal: Ability to describe self-care measures that may prevent or decrease complications (Diabetes Survival Skills Education) will improve 09/27/2024 1659 by Sheran Loving, LPN Outcome: Progressing 09/27/2024 1052 by Sheran Loving, LPN Outcome: Progressing Goal: Individualized Educational Video(s) 09/27/2024 1659 by Sheran Loving, LPN Outcome: Progressing 09/27/2024 1052 by Sheran Loving, LPN Outcome: Progressing   Problem: Coping: Goal: Ability to adjust to condition or change in health will improve 09/27/2024 1659 by Sheran Loving, LPN Outcome: Progressing 09/27/2024 1052 by Sheran Loving, LPN Outcome: Progressing   Problem: Fluid Volume: Goal: Ability to maintain a balanced intake and output will improve 09/27/2024 1659 by Sheran Loving, LPN Outcome: Progressing 09/27/2024 1052 by Sheran Loving, LPN Outcome: Progressing   Problem: Health Behavior/Discharge Planning: Goal: Ability to identify and utilize available resources and services will improve 09/27/2024 1659 by Sheran Loving, LPN Outcome: Progressing 09/27/2024 1052 by Sheran Loving, LPN Outcome: Progressing Goal: Ability to manage health-related needs will improve 09/27/2024 1659 by Sheran Loving, LPN Outcome: Progressing 09/27/2024 1052 by Sheran Loving, LPN Outcome: Progressing   Problem: Metabolic: Goal: Ability to maintain appropriate glucose levels will improve 09/27/2024 1659 by Sheran Loving, LPN Outcome: Progressing 09/27/2024 1052 by Sheran Loving, LPN Outcome: Progressing   Problem: Nutritional: Goal: Maintenance of adequate nutrition will improve 09/27/2024 1659 by Sheran Loving, LPN Outcome: Progressing 09/27/2024 1052 by Sheran Loving, LPN Outcome: Progressing Goal: Progress toward achieving an optimal weight will improve 09/27/2024 1659 by Sheran Loving,  LPN Outcome: Progressing 09/27/2024 1052 by Sheran Loving, LPN Outcome: Progressing   Problem: Skin Integrity: Goal: Risk for impaired skin integrity will decrease 09/27/2024 1659 by Sheran Loving, LPN Outcome: Progressing 09/27/2024 1052 by Sheran Loving, LPN Outcome: Progressing   Problem: Tissue Perfusion: Goal: Adequacy of tissue perfusion will improve 09/27/2024 1659 by Sheran Loving, LPN Outcome: Progressing 09/27/2024 1052 by Sheran Loving, LPN Outcome: Progressing   Problem: Education: Goal: Knowledge of disease and its progression will improve 09/27/2024 1659 by Sheran Loving, LPN Outcome: Progressing 09/27/2024 1052 by Sheran Loving, LPN Outcome: Progressing   Problem: Health Behavior/Discharge Planning: Goal: Ability to manage health-related needs will improve 09/27/2024 1659 by Sheran Loving, LPN Outcome: Progressing 09/27/2024 1052 by Sheran Loving, LPN Outcome: Progressing   Problem: Clinical Measurements: Goal: Complications related to the disease process or treatment will be avoided or minimized 09/27/2024 1659 by Sheran Loving, LPN Outcome: Progressing 09/27/2024 1052 by Sheran Loving, LPN Outcome: Progressing Goal: Dialysis access will remain free of complications 09/27/2024 1659 by Sheran Loving, LPN Outcome: Progressing 09/27/2024 1052 by Sheran Loving, LPN Outcome: Progressing   Problem: Activity: Goal: Activity intolerance will improve 09/27/2024 1659 by Sheran Loving, LPN Outcome: Progressing 09/27/2024 1052 by Sheran Loving, LPN Outcome: Progressing   Problem: Fluid Volume: Goal: Fluid volume balance will be maintained or improved 09/27/2024 1659 by Sheran Loving, LPN Outcome: Progressing 09/27/2024 1052 by Sheran Loving, LPN Outcome: Progressing   Problem: Nutritional: Goal: Ability to make appropriate dietary  choices will improve 09/27/2024 1659 by Sheran Loving, LPN Outcome: Progressing 09/27/2024 1052 by Sheran Loving, LPN Outcome: Progressing   Problem: Respiratory: Goal: Respiratory symptoms related to disease process will be avoided 09/27/2024 1659 by Sheran Loving, LPN Outcome: Progressing 09/27/2024 1052 by Sheran Loving, LPN Outcome: Progressing   Problem: Self-Concept: Goal: Body image disturbance will be avoided or minimized 09/27/2024 1659 by Sheran Loving, LPN Outcome: Progressing 09/27/2024 1052 by Sheran Loving, LPN Outcome: Progressing   Problem: Urinary Elimination: Goal: Progression of disease will be identified and treated 09/27/2024 1659 by Sheran Loving, LPN  Outcome: Progressing 09/27/2024 1052 by Sheran Loving, LPN Outcome: Progressing   Problem: Education: Goal: Knowledge of General Education information will improve Description: Including pain rating scale, medication(s)/side effects and non-pharmacologic comfort measures 09/27/2024 1659 by Sheran Loving, LPN Outcome: Progressing 09/27/2024 1052 by Sheran Loving, LPN Outcome: Progressing   Problem: Health Behavior/Discharge Planning: Goal: Ability to manage health-related needs will improve 09/27/2024 1659 by Sheran Loving, LPN Outcome: Progressing 09/27/2024 1052 by Sheran Loving, LPN Outcome: Progressing   Problem: Clinical Measurements: Goal: Ability to maintain clinical measurements within normal limits will improve 09/27/2024 1659 by Sheran Loving, LPN Outcome: Progressing 09/27/2024 1052 by Sheran Loving, LPN Outcome: Progressing Goal: Will remain free from infection 09/27/2024 1659 by Sheran Loving, LPN Outcome: Progressing 09/27/2024 1052 by Sheran Loving, LPN Outcome: Progressing Goal: Diagnostic test results will improve 09/27/2024 1659 by Sheran Loving, LPN Outcome:  Progressing 09/27/2024 1052 by Sheran Loving, LPN Outcome: Progressing Goal: Respiratory complications will improve 09/27/2024 1659 by Sheran Loving, LPN Outcome: Progressing 09/27/2024 1052 by Sheran Loving, LPN Outcome: Progressing Goal: Cardiovascular complication will be avoided 09/27/2024 1659 by Sheran Loving, LPN Outcome: Progressing 09/27/2024 1052 by Sheran Loving, LPN Outcome: Progressing   Problem: Activity: Goal: Risk for activity intolerance will decrease 09/27/2024 1659 by Sheran Loving, LPN Outcome: Progressing 09/27/2024 1052 by Sheran Loving, LPN Outcome: Progressing   Problem: Nutrition: Goal: Adequate nutrition will be maintained 09/27/2024 1659 by Sheran Loving, LPN Outcome: Progressing 09/27/2024 1052 by Sheran Loving, LPN Outcome: Progressing   Problem: Coping: Goal: Level of anxiety will decrease 09/27/2024 1659 by Sheran Loving, LPN Outcome: Progressing 09/27/2024 1052 by Sheran Loving, LPN Outcome: Progressing   Problem: Elimination: Goal: Will not experience complications related to bowel motility 09/27/2024 1659 by Sheran Loving, LPN Outcome: Progressing 09/27/2024 1052 by Sheran Loving, LPN Outcome: Progressing Goal: Will not experience complications related to urinary retention 09/27/2024 1659 by Sheran Loving, LPN Outcome: Progressing 09/27/2024 1052 by Sheran Loving, LPN Outcome: Progressing   Problem: Pain Managment: Goal: General experience of comfort will improve and/or be controlled 09/27/2024 1659 by Sheran Loving, LPN Outcome: Progressing 09/27/2024 1052 by Sheran Loving, LPN Outcome: Progressing   Problem: Safety: Goal: Ability to remain free from injury will improve 09/27/2024 1659 by Sheran Loving, LPN Outcome: Progressing 09/27/2024 1052 by Sheran Loving, LPN Outcome: Progressing    Problem: Skin Integrity: Goal: Risk for impaired skin integrity will decrease 09/27/2024 1659 by Sheran Loving, LPN Outcome: Progressing 09/27/2024 1052 by Sheran Loving, LPN Outcome: Progressing   "

## 2024-09-27 NOTE — Assessment & Plan Note (Signed)
 T2DM: hold nighttime Lantus  on admission, start moderate SSI HLD: continue home Pravastatin  40 mg daily

## 2024-09-28 ENCOUNTER — Inpatient Hospital Stay (HOSPITAL_COMMUNITY)

## 2024-09-28 ENCOUNTER — Encounter (HOSPITAL_COMMUNITY): Payer: Self-pay | Admitting: Radiology

## 2024-09-28 ENCOUNTER — Other Ambulatory Visit (HOSPITAL_COMMUNITY): Payer: Self-pay

## 2024-09-28 ENCOUNTER — Telehealth (HOSPITAL_COMMUNITY): Payer: Self-pay | Admitting: Pharmacy Technician

## 2024-09-28 ENCOUNTER — Encounter (HOSPITAL_COMMUNITY): Payer: Self-pay | Admitting: Internal Medicine

## 2024-09-28 ENCOUNTER — Other Ambulatory Visit: Payer: Self-pay

## 2024-09-28 DIAGNOSIS — R7881 Bacteremia: Secondary | ICD-10-CM | POA: Diagnosis not present

## 2024-09-28 DIAGNOSIS — J188 Other pneumonia, unspecified organism: Secondary | ICD-10-CM | POA: Diagnosis not present

## 2024-09-28 DIAGNOSIS — N179 Acute kidney failure, unspecified: Secondary | ICD-10-CM | POA: Diagnosis not present

## 2024-09-28 DIAGNOSIS — E1122 Type 2 diabetes mellitus with diabetic chronic kidney disease: Secondary | ICD-10-CM

## 2024-09-28 DIAGNOSIS — N186 End stage renal disease: Secondary | ICD-10-CM | POA: Diagnosis not present

## 2024-09-28 DIAGNOSIS — I120 Hypertensive chronic kidney disease with stage 5 chronic kidney disease or end stage renal disease: Secondary | ICD-10-CM | POA: Diagnosis not present

## 2024-09-28 DIAGNOSIS — J449 Chronic obstructive pulmonary disease, unspecified: Secondary | ICD-10-CM | POA: Diagnosis not present

## 2024-09-28 DIAGNOSIS — B9562 Methicillin resistant Staphylococcus aureus infection as the cause of diseases classified elsewhere: Secondary | ICD-10-CM | POA: Diagnosis not present

## 2024-09-28 DIAGNOSIS — I509 Heart failure, unspecified: Secondary | ICD-10-CM

## 2024-09-28 DIAGNOSIS — N185 Chronic kidney disease, stage 5: Secondary | ICD-10-CM | POA: Diagnosis not present

## 2024-09-28 LAB — BLOOD GAS, VENOUS
Acid-Base Excess: 2.8 mmol/L — ABNORMAL HIGH (ref 0.0–2.0)
Bicarbonate: 26.1 mmol/L (ref 20.0–28.0)
Drawn by: 68626
O2 Saturation: 75.4 %
Patient temperature: 36.7
pCO2, Ven: 34 mmHg — ABNORMAL LOW (ref 44–60)
pH, Ven: 7.49 — ABNORMAL HIGH (ref 7.25–7.43)
pO2, Ven: 40 mmHg (ref 32–45)

## 2024-09-28 LAB — RENAL FUNCTION PANEL
Albumin: 3.9 g/dL (ref 3.5–5.0)
Anion gap: 19 — ABNORMAL HIGH (ref 5–15)
BUN: 117 mg/dL — ABNORMAL HIGH (ref 8–23)
CO2: 22 mmol/L (ref 22–32)
Calcium: 7.5 mg/dL — ABNORMAL LOW (ref 8.9–10.3)
Chloride: 92 mmol/L — ABNORMAL LOW (ref 98–111)
Creatinine, Ser: 11.3 mg/dL — ABNORMAL HIGH (ref 0.44–1.00)
GFR, Estimated: 3 mL/min — ABNORMAL LOW
Glucose, Bld: 105 mg/dL — ABNORMAL HIGH (ref 70–99)
Phosphorus: 7.4 mg/dL — ABNORMAL HIGH (ref 2.5–4.6)
Potassium: 3.6 mmol/L (ref 3.5–5.1)
Sodium: 133 mmol/L — ABNORMAL LOW (ref 135–145)

## 2024-09-28 LAB — BLOOD GAS, ARTERIAL
Acid-Base Excess: 0.1 mmol/L (ref 0.0–2.0)
Bicarbonate: 23.6 mmol/L (ref 20.0–28.0)
Drawn by: 744171
O2 Saturation: 98.3 %
Patient temperature: 36.9
pCO2 arterial: 34 mmHg (ref 32–48)
pH, Arterial: 7.45 (ref 7.35–7.45)
pO2, Arterial: 92 mmHg (ref 83–108)

## 2024-09-28 LAB — RESPIRATORY PANEL BY PCR

## 2024-09-28 LAB — GLUCOSE, CAPILLARY
Glucose-Capillary: 125 mg/dL — ABNORMAL HIGH (ref 70–99)
Glucose-Capillary: 112 mg/dL — ABNORMAL HIGH (ref 70–99)
Glucose-Capillary: 172 mg/dL — ABNORMAL HIGH (ref 70–99)
Glucose-Capillary: 182 mg/dL — ABNORMAL HIGH (ref 70–99)

## 2024-09-28 LAB — BASIC METABOLIC PANEL WITH GFR
Anion gap: 16 — ABNORMAL HIGH (ref 5–15)
BUN: 69 mg/dL — ABNORMAL HIGH (ref 8–23)
CO2: 24 mmol/L (ref 22–32)
Calcium: 8.1 mg/dL — ABNORMAL LOW (ref 8.9–10.3)
Chloride: 93 mmol/L — ABNORMAL LOW (ref 98–111)
Creatinine, Ser: 7.34 mg/dL — ABNORMAL HIGH (ref 0.44–1.00)
GFR, Estimated: 6 mL/min — ABNORMAL LOW
Glucose, Bld: 112 mg/dL — ABNORMAL HIGH (ref 70–99)
Potassium: 3.5 mmol/L (ref 3.5–5.1)
Sodium: 133 mmol/L — ABNORMAL LOW (ref 135–145)

## 2024-09-28 LAB — CBC
HCT: 22.8 % — ABNORMAL LOW (ref 36.0–46.0)
Hemoglobin: 7.3 g/dL — ABNORMAL LOW (ref 12.0–15.0)
MCH: 26.4 pg (ref 26.0–34.0)
MCHC: 32 g/dL (ref 30.0–36.0)
MCV: 82.6 fL (ref 80.0–100.0)
Platelets: 170 K/uL (ref 150–400)
RBC: 2.76 MIL/uL — ABNORMAL LOW (ref 3.87–5.11)
RDW: 17 % — ABNORMAL HIGH (ref 11.5–15.5)
WBC: 16.1 K/uL — ABNORMAL HIGH (ref 4.0–10.5)
nRBC: 0.6 % — ABNORMAL HIGH (ref 0.0–0.2)

## 2024-09-28 LAB — TROPONIN T, HIGH SENSITIVITY
Troponin T High Sensitivity: 316 ng/L (ref 0–19)
Troponin T High Sensitivity: 290 ng/L (ref 0–19)

## 2024-09-28 LAB — HEPATITIS B SURFACE ANTIBODY, QUANTITATIVE: Hep B S AB Quant (Post): <3.5 m[IU]/mL — ABNORMAL LOW

## 2024-09-28 MED ORDER — HEPARIN (PORCINE) 25000 UT/250ML-% IV SOLN
1400.0000 [IU]/h | INTRAVENOUS | Status: DC
  Administered 2024-09-28 – 2024-09-29 (×2): 1400 [IU]/h via INTRAVENOUS
  Filled 2024-09-28 (×2): qty 250

## 2024-09-28 MED ORDER — IPRATROPIUM-ALBUTEROL 0.5-2.5 (3) MG/3ML IN SOLN
RESPIRATORY_TRACT | Status: AC
  Filled 2024-09-28: qty 3

## 2024-09-28 MED ORDER — HYDROXYZINE HCL 25 MG PO TABS
25.0000 mg | ORAL_TABLET | Freq: Once | ORAL | Status: AC
  Administered 2024-09-29: 25 mg via ORAL
  Filled 2024-09-28: qty 1

## 2024-09-28 MED ORDER — CEFAZOLIN SODIUM-DEXTROSE 2-4 GM/100ML-% IV SOLN
2.0000 g | INTRAVENOUS | Status: AC
  Administered 2024-09-29: 2 g via INTRAVENOUS
  Filled 2024-09-28: qty 100

## 2024-09-28 MED ORDER — IOHEXOL 350 MG/ML SOLN
75.0000 mL | Freq: Once | INTRAVENOUS | Status: AC | PRN
  Administered 2024-09-28: 75 mL via INTRAVENOUS

## 2024-09-28 MED ORDER — PIPERACILLIN-TAZOBACTAM IN DEX 2-0.25 GM/50ML IV SOLN
2.2500 g | Freq: Three times a day (TID) | INTRAVENOUS | Status: DC
  Administered 2024-09-28 – 2024-09-29 (×4): 2.25 g via INTRAVENOUS
  Filled 2024-09-28 (×5): qty 50

## 2024-09-28 MED ORDER — LORAZEPAM 0.5 MG PO TABS
0.2500 mg | ORAL_TABLET | Freq: Once | ORAL | Status: AC
  Administered 2024-09-28: 0.25 mg via ORAL
  Filled 2024-09-28: qty 1

## 2024-09-28 MED ORDER — HEPARIN SODIUM (PORCINE) 1000 UNIT/ML IJ SOLN
INTRAMUSCULAR | Status: AC
  Filled 2024-09-28: qty 3

## 2024-09-28 MED ORDER — CHLORHEXIDINE GLUCONATE 4 % EX SOLN
60.0000 mL | Freq: Once | CUTANEOUS | Status: AC
  Administered 2024-09-29: 4 via TOPICAL
  Filled 2024-09-28: qty 60

## 2024-09-28 MED ORDER — HEPARIN (PORCINE) 25000 UT/250ML-% IV SOLN: 10.0000 [IU]/kg/h | INTRAVENOUS | Status: DC

## 2024-09-28 MED ORDER — HYDROXYZINE HCL 10 MG PO TABS
ORAL_TABLET | ORAL | Status: AC
  Filled 2024-09-28: qty 1

## 2024-09-28 MED ORDER — VANCOMYCIN VARIABLE DOSE PER UNSTABLE RENAL FUNCTION (PHARMACIST DOSING): Status: DC

## 2024-09-28 MED ORDER — METHYLPREDNISOLONE SODIUM SUCC 125 MG IJ SOLR: 125.0000 mg | INTRAMUSCULAR | Status: AC

## 2024-09-28 MED ORDER — HEPARIN BOLUS VIA INFUSION
6000.0000 [IU] | Freq: Once | INTRAVENOUS | Status: AC
  Administered 2024-09-28: 6000 [IU] via INTRAVENOUS
  Filled 2024-09-28: qty 6000

## 2024-09-28 MED ORDER — CARVEDILOL 25 MG PO TABS
25.0000 mg | ORAL_TABLET | Freq: Two times a day (BID) | ORAL | Status: DC
  Administered 2024-09-28 – 2024-09-29 (×2): 25 mg via ORAL
  Filled 2024-09-28 (×2): qty 1

## 2024-09-28 MED ORDER — METHYLPREDNISOLONE SODIUM SUCC 125 MG IJ SOLR
INTRAMUSCULAR | Status: AC
  Administered 2024-09-28: 125 mg via INTRAVENOUS
  Filled 2024-09-28: qty 2

## 2024-09-28 MED ORDER — CHLORHEXIDINE GLUCONATE 4 % EX SOLN
60.0000 mL | Freq: Once | CUTANEOUS | Status: AC
  Administered 2024-09-28: 4 via TOPICAL
  Filled 2024-09-28: qty 60

## 2024-09-28 MED ORDER — HYDROXYZINE HCL 10 MG PO TABS
10.0000 mg | ORAL_TABLET | Freq: Three times a day (TID) | ORAL | Status: DC | PRN
  Administered 2024-09-28 (×2): 10 mg via ORAL
  Filled 2024-09-28: qty 1

## 2024-09-28 MED ORDER — VANCOMYCIN HCL 1750 MG/350ML IV SOLN
1750.0000 mg | Freq: Once | INTRAVENOUS | Status: AC
  Administered 2024-09-28: 1750 mg via INTRAVENOUS
  Filled 2024-09-28 (×2): qty 350

## 2024-09-28 NOTE — Progress Notes (Signed)
"  °  Progress Note  Patient Name: Catherine Aguilar Date of Encounter: 09/28/2024 [facility] Cardiologist: None   Interval Summary   Cardiology asked to review around due to abnormal echocardiogram.  Looks like this afternoon she had severe COPD exacerbation and tripoding.  New concerns of PE versus COPD exacerbation.  Seems extremely dyspneic, with audible wheezing sounds and tachypneic.  Not able to talk.  Primary team notified.  Vital Signs Vitals:   09/28/24 1044 09/28/24 1153 09/28/24 1326 09/28/24 1438  BP: (!) 183/101 (!) 179/74  (!) 154/67  Pulse: (!) 101 (!) 104  92  Resp: 19 18    Temp: 99.5 F (37.5 C) 98 F (36.7 C)  (!) 101.3 F (38.5 C)  TempSrc: Oral   Oral  SpO2: 97% 95% 100% 97%  Weight:      Height:        Intake/Output Summary (Last 24 hours) at 09/28/2024 1508 Last data filed at 09/28/2024 1044 Gross per 24 hour  Intake 687 ml  Output 1700 ml  Net -1013 ml      09/27/2024    2:00 AM 09/26/2024   11:45 PM 09/25/2024    9:26 PM  Last 3 Weights  Weight (lbs) 180 lb 12.4 oz 182 lb 15.7 oz 183 lb  Weight (kg) 82 kg 83 kg 83.008 kg      Telemetry/ECG  Sinus rhythm borderline tachycardia- Personally Reviewed  Physical Exam  GEN: Respiratory distress Neck: No JVD Cardiac: RRR, 2 out of 6 murmur  respiratory: Clear to auscultation bilaterally. GI: Soft, nontender, non-distended  MS: No edema  Patient Profile Patient with past medical history significant for CKD now on HD this admission, Wegener's granulomatosis involving kidneys but not lungs, COPD, diabetes, anemia , hypertension.  1/17 we were consulted for elevated troponins felt to be demand.  1/19 reconsulted again for abnormal echocardiogram  Assessment & Plan   Elevated troponins Not felt to be ACS.  Troponins have peaked around 400 and with a downward trend.  Likely demand ischemia in the setting of acute hypoxic respiratory failure with pulmonary edema. Ischemic testing can be  considered outpatient Continue with pravastatin  40 mg daily.  Acute respiratory failure with pulmonary edema Volume status/symptoms had improved greatly after initiation of HD.  She had another episode today that seems most consistent with acute COPD exacerbation and still very much tachypneic and wheezy on exam.  Critical care now involved.  Primary team working up for possible PE/DVT.  On IV heparin  now.  Needs very close monitoring.  CKD on HD  Management per nephrology.  Volume status looks okay.  Abnormal echocardiogram Echocardiogram yesterday demonstrates hyperdynamic EF, no wall motion abnormalities.  Severe asymmetric LVH of the septal segment.  Flow murmur likely related to hyperdynamic EF and LVH.  Normal RV.  RVSP 36.9.  LVH is not new and reported on prior echocardiogram in April 2025.  Likely a result of uncontrolled hypertension although infiltrative cardiomyopathy/amyloid could be considered.  Will discuss with MD if further workup is indicated.  Hypertension History of uncontrolled blood pressure.   Blood pressure has been improved after titrating medications.  Continue with hydralazine  100 mg 3 times daily, amlodipine  10 mg, carvedilol  25 mg twice daily, doxazosin  2 mg.   For questions or updates, please contact [facility] Please consult [email or web address] for contact info under       Signed, Thom LITTIE Sluder, PA-C    "

## 2024-09-28 NOTE — Assessment & Plan Note (Addendum)
 Hgb stable at 7.3. She has required blood transfusions in the past. - Transfusion threshold of <8 - AM CBC

## 2024-09-28 NOTE — Progress Notes (Signed)
 FMTS Attending Daily Note: Catherine Keeling, MD  Team Pager [phone] Pager [phone] I have personally seen and examined this patient, reviewed their chart and results. I have discussed this patient with the resident. I agree with the resident's findings, assessment and care plan as documented in the resident's note, clarifications or changes to the resident's note are listed below.   Seen this AM around 9:30 in HD. Tearful, feeling overwhelmed that she is starting HD. Denies any chest pain or shortness of breath. Normal WOB.  Paged [phone]: rapid response patient ended dialysis early due to shortness of breath. Arrived at bedside, pt tripoding, nasal flaring, intercostal retractions, speaking in 1-2 word sentences. CXR viewed at beside with cardiomegaly, no pneomothorax, no flash pulmonary edema. EKG at bedside sinus tachycardic, no STE, similar to baseline. Lungs with decreased breath sounds at baseline, pt hyperventilating, wheezing noted. Heart tachycardic with soft murmur. Legs without edema. Extremities warm well-perfused,   Acute Onset Worsening shortness of breath Ddx PE versus COPD exac versus less likely volume overload vs ischemia.  - start IV heparin , consider CTA chest although currently she is unable to lay flat, still pending DVT sono - just had 2L out with HD, does not seem like flash pulmonary edema - trialed biPAP, pt unable to tolerate - VBG without hypercarbia - IV solumedrol given in case of COPD exacerbation - consulted PCCM in case of clinical worsening - continue to monitor respiratory status closely.  Catherine Keeling MD

## 2024-09-28 NOTE — Assessment & Plan Note (Signed)
 Elevated to 401 on admission, though trended flat ~350. LDL 35. Likely 2/2 demand ischemia from volume overload. Denies CP.  - Cardiology consulted, appreciate recommendations. - Holding off on ASA in the setting of anemia requiring recurrent transfusions per cardiology

## 2024-09-28 NOTE — Progress Notes (Signed)
 Pharmacy Antibiotic Note  Catherine Aguilar is a 69 y.o. female admitted on 09/25/2024 with leukocytosis and fever.  Pharmacy has been consulted for Vancomycin  and Zosyn  dosing.  Noted pt is new HD pt - first HD session on 1/17, received another session 1/19 (BFR x 3h)  Plan: Zosyn  2.25gm IV q8h Vancomycin  1750 mg IV now Will f/u HD schedule/tolerance for further vancomycin  dosing Will f/u micro data and pt's clinical condition   Height: 5' 7 (170.2 cm) Weight: 82 kg (180 lb 12.4 oz) IBW/kg (Calculated) : 61.6  Temp (24hrs), Avg:98.9 F (37.2 C), Min:98 F (36.7 C), Max:101.3 F (38.5 C)  Recent Labs  Lab 09/23/24 1312 09/23/24 1335 09/25/24 2134 09/26/24 0343 09/27/24 0401 09/28/24 0124 09/28/24 1323  WBC 8.6  --  12.0* 14.7* 13.6* 16.1*  --   CREATININE 14.90*   < > 14.80* 14.40* 10.40* 11.30* 7.34*   < > = values in this interval not displayed.    Estimated Creatinine Clearance: 8 mL/min (A) (by C-G formula based on SCr of 7.34 mg/dL (H)).    Allergies[1]  Antimicrobials this admission: 1/19 Zosyn  >>  1/19 Vanc >>   Microbiology results: Pending  Thank you for allowing pharmacy to be a part of this patients care.  Vito Ralph, PharmD, BCPS Please see amion for complete clinical pharmacist phone list 09/28/2024 4:10 PM     [1] No Known Allergies

## 2024-09-28 NOTE — Assessment & Plan Note (Addendum)
 Elevated to 401 on admission, though trended flat ~350. LDL 35. Likely 2/2 demand ischemia from volume overload. Denies CP.  - Cardiology consulted, appreciate recommendations. - Holding off on ASA in the setting of anemia requiring recurrent transfusions per cardiology

## 2024-09-28 NOTE — Progress Notes (Signed)
 Requested to see pt for out-pt HD needs at d/c. Attempted to meet with pt but pt receiving pt care. Per pt's RN, pt to be transferred to another floor and awaiting bed assignment. Will follow and assist with HD arrangement. Will meet with pt once that is appropriate to do.   Randine Mungo Dialysis Navigator [phone]

## 2024-09-28 NOTE — Progress Notes (Signed)
 Patient lying in the bed with audible wheezing. Respiratory notified at [phone]. Oxygen saturation 100% on 2L [place].

## 2024-09-28 NOTE — Progress Notes (Signed)
 Vascular studies attempted 3x today.  First attempt patient in HD.  Second attempt patient being moved to progressive bed on 4E.  Third attempt patient being put on bedside commode.  Will re attempt as schedule and patient availability permit, however will most like be tomorrow morning.   09/28/2024 3:36 PM Zuhayr Deeney RVT, RDMS

## 2024-09-28 NOTE — Assessment & Plan Note (Signed)
 T2DM: hold nighttime Lantus  on admission, start moderate SSI HLD: continue home Pravastatin  40 mg daily

## 2024-09-28 NOTE — Progress Notes (Signed)
 "    Daily Progress Note Intern Pager: [phone]  Patient name: Catherine Aguilar Medical record number: [id number] Date of birth: Oct 21, 1955 Age: 69 y.o. Gender: female  Primary Care Provider: Adele Song, MD Consultants: Neprology, Cardiology, VVS Code Status: Full code  Pt Overview and Major Events to Date:  1/17 - admitted, temp HD catheter placed 1/18 - initiated dialysis 1/19 - dialysis today  Assessment and Plan: Catherine Aguilar is a 69 y.o. F with PMHx of T2DM, HTN, CKD stage V, anemia of chronic disease, COPD, Wegener's granulomatosis with renal involvement who is admitted for CHF exacerbation.  She is diuresing well, and now s/p dialysis x2. Plan for [facility] and graft Tuesday/Wednesday with vascular.   Patient states she does not feel well this morning but unable to describe how. Denies SOB, CP, heart palpitations. States it is more of a feeling. Possible this is anxiety-related but will continue to monitor and work-up if appropriate. Assessment & Plan Acute kidney injury superimposed on stage 5 chronic kidney disease, not on chronic dialysis (HCC) Volume overload Patient in HD this AM. Cr 10.4 > 11.3. S/p dialysis x1 (baseline 10-12). 700 total output, weight down 1kg. Denies pain this AM. Echo with EF 70-75%, LV with hyperdynamic function and severe asymmetric LV hypertrophy of the septal segment and turbulent flow/flow accerelation in LVOT. - Nephrology consulted, appreciate recommendations. - Discontinue IV Lasix  80 mg BID and home sodium bicarbonate  1300 mg BID per nephrology - VVS consulted, appreciate recommendations - Plan for [facility] and AFV/G tomorrow - Spoke with cardiology regarding Echo; cardiology to see - Tylenol  650 mg q6hrs PRN PO or rectal for pain - Avoid nephrotoxic agents - Strict I&Os, daily weights - AM CBC, RFP - Hydroxyzine  10mg  TID PRN Elevated troponin Demand ischemia (HCC) Elevated to 401 on admission, though trended flat ~350. LDL 35. Likely 2/2 demand  ischemia from volume overload. Denies CP.  - Cardiology consulted, appreciate recommendations. - Holding off on ASA in the setting of anemia requiring recurrent transfusions per cardiology Elevated d-dimer 2.08 on admission; unclear the significance of this given patient history. No erythema, pain or edema in either LE on exam. Likely 2/2 volume overload but will workup as below.  - Pending DVT ultrasound - Will hold off on V/Q scan for now  - Heparin  added back given low suspicion for bleed COPD (chronic obstructive pulmonary disease) (HCC) Patient wheezes at baseline. - Continue home Breztri  inhaler 2 puffs daily  - Continue DuoNebs q4hrs PRN - SCH + PRN - Benzonatate  200 mg TID PRN for cough Anemia of chronic kidney failure, stage 5 (HCC) Hgb stable at 7.3. She has required blood transfusions in the past. - Transfusion threshold of <8 - AM CBC Essential hypertension Remains elevated. - Cardiology consulted, appreciate recommendations - Increase Carvedilol  to 25mg  BID, continue Hydralazine  100 mg TID - Continue home Amlodipine  10 mg daily and Doxazosin  2 mg daily Chronic health problem T2DM: hold nighttime Lantus  on admission, start moderate SSI HLD: continue home Pravastatin  40 mg daily  Elevated WBC count today 13.6 > 16.1   FEN/GI: renal with fluid restriction PPx: Heparin  Dispo:Pending PT recommendations  pending clinical improvement   Subjective:  Patient seen in HD this morning. States she has not felt well since last night but is unable to describe how she is feeling. Denies CP, SOB, heart palpitations, dizziness, abdominal pain.   Objective: Temp:  [98 F (36.7 C)-98.7 F (37.1 C)] 98 F (36.7 C) (01/19 0528) Pulse Rate:  [62-75] 73 (  01/19 0528) Resp:  [18] 18 (01/19 0528) BP: (115-141)/(60-65) 136/65 (01/19 0528) SpO2:  [90 %-98 %] 90 % (01/19 0528)  Physical Exam: General: Awake, laying in bed.  Neck: temp HD catheter in place Cardiovascular:  RRR Pulmonary: Wheezing present Abdomen: Soft, non-tender, non-distended. Extremities: without edema Neurologic: EOMI, PERRLA. Patient responding appropriately and no focal deficits on exam.   Laboratory: Most recent CBC Lab Results  Component Value Date   WBC 16.1 (H) 09/28/2024   HGB 7.3 (L) 09/28/2024   HCT 22.8 (L) 09/28/2024   MCV 82.6 09/28/2024   PLT 170 09/28/2024   Most recent BMP    Latest Ref Rng & Units 09/28/2024    1:24 AM  BMP  Glucose 70 - 99 mg/dL 894   BUN 8 - 23 mg/dL 882   Creatinine 9.55 - 1.00 mg/dL 88.69   Sodium 864 - 854 mmol/L 133   Potassium 3.5 - 5.1 mmol/L 3.6   Chloride 98 - 111 mmol/L 92   CO2 22 - 32 mmol/L 22   Calcium  8.9 - 10.3 mg/dL 7.5     Imaging: ECHOCARDIOGRAM COMPLETE Result Date: 09/27/2024 1. LV EF 70-75%. LV with hyperdynamic function, no regional wall motion abnormalities, severe asymmetric LV hypertrophy of the septal segment. Turbulent flow/flow accerelation in the LVOT likely from a combination of severe LVH and hyperdynamic LV function. Grade I diastolic dysfunction (impaired relaxation). Elevated left ventricular end-diastolic pressure. 2. Right ventricular systolic function is normal. The right ventricular size is moderately enlarged. There is mildly elevated pulmonary artery systolic pressure. The estimated right ventricular systolic pressure is 36.9 mmHg.   3. Left atrial size was mildly dilated.   4. Right atrial size was mild to moderately dilated.   5. The mitral valve is grossly normal. No evidence of mitral valve regurgitation. No evidence of mitral stenosis.   6. The aortic valve was not well visualized. Aortic valve regurgitation is not visualized. No aortic stenosis is present.   7. The inferior vena cava is dilated in size with >50% respiratory variability, suggesting right atrial pressure of 8 mmHg.   8. Increased flow velocities may be secondary to anemia, thyrotoxicosis, hyperdynamic or high flow state.   9.  Small to moderate pericardial effusion is located posterior and lateral to the left ventricle.   [name], DO 09/28/2024, 7:54 AM  PGY-1, [facility] FPTS Intern pager: [phone], text pages welcome Secure chat group [facility] Teaching Service   "

## 2024-09-28 NOTE — Assessment & Plan Note (Addendum)
 Patient in HD this AM. Cr 10.4 > 11.3. S/p dialysis x1 (baseline 10-12). 700 total output, weight down 1kg. Denies pain this AM. Echo with EF 70-75%, LV with hyperdynamic function and severe asymmetric LV hypertrophy of the septal segment and turbulent flow/flow accerelation in LVOT. - Nephrology consulted, appreciate recommendations. - Discontinue IV Lasix  80 mg BID and home sodium bicarbonate  1300 mg BID per nephrology - VVS consulted, appreciate recommendations - Plan for [facility] and AFV/G tomorrow - Spoke with cardiology regarding Echo; cardiology to see - Tylenol  650 mg q6hrs PRN PO or rectal for pain - Avoid nephrotoxic agents - Strict I&Os, daily weights - AM CBC, RFP - Hydroxyzine  10mg  TID PRN

## 2024-09-28 NOTE — Progress Notes (Addendum)
" ° ° °  PROCEDURAL EXPEDITER PROGRESS NOTE  Patient Name: Catherine Aguilar  DOB:1956/02/02 Date of Admission: 09/25/2024  Date of Assessment:09/28/24   -------------------------------------------------------------------------------------------------------------------   Brief clinical summary: patient going for surgery on 09-29-24 for Left arm arteriovenous fistula creation vs areriovenous graft placement and tunnelled dialysis catheter placement  Orders in place:   No   Communication with surgical team if no orders: IB MD  Labs, test, and orders reviewed: yes  Requires surgical clearance:   No  What type of clearance: n/a  Clearance received: n/a  Barriers noted:no preop surgical orders in place.   Intervention provided by [facility] team: sent inbox message requesting preop surgical orders  Barrier resolved: orders have been placed  -------------------------------------------------------------------------------------------------------------------  [facility] Expediter, Rexene LITTIE Kirks Please contact us  directly via secure chat (search for [facility]) or by calling us  at [phone] [facility]).  "

## 2024-09-28 NOTE — Progress Notes (Addendum)
" °  Progress Note    09/28/2024 12:39 PM Hospital Day 2  Subjective:  says yes when asked if they had done ultrasound of arms.  No complaints  Tm 99.5 now afebrile  Vitals:   09/28/24 1044 09/28/24 1153  BP: (!) 183/101 (!) 179/74  Pulse: (!) 101 (!) 104  Resp: 19 18  Temp: 99.5 F (37.5 C) 98 F (36.7 C)  SpO2: 97% 95%    Physical Exam: General:  no distress Lungs:  non labored Extremities:  IV left forearm  CBC    Component Value Date/Time   WBC 16.1 (H) 09/28/2024 0124   RBC 2.76 (L) 09/28/2024 0124   HGB 7.3 (L) 09/28/2024 0124   HGB 8.2 (L) 12/30/2023 1123   HCT 22.8 (L) 09/28/2024 0124   HCT 25.7 (L) 12/30/2023 1123   PLT 170 09/28/2024 0124   PLT 235 12/30/2023 1123   MCV 82.6 09/28/2024 0124   MCV 81 12/30/2023 1123   MCH 26.4 09/28/2024 0124   MCHC 32.0 09/28/2024 0124   RDW 17.0 (H) 09/28/2024 0124   RDW 16.4 (H) 12/30/2023 1123   LYMPHSABS 1.5 09/25/2024 2134   MONOABS 0.9 09/25/2024 2134   EOSABS 0.2 09/25/2024 2134   BASOSABS 0.1 09/25/2024 2134    BMET    Component Value Date/Time   NA 133 (L) 09/28/2024 0124   NA 138 01/07/2024 1507   K 3.6 09/28/2024 0124   CL 92 (L) 09/28/2024 0124   CO2 22 09/28/2024 0124   GLUCOSE 105 (H) 09/28/2024 0124   BUN 117 (H) 09/28/2024 0124   BUN 89 (HH) 01/07/2024 1507   CREATININE 11.30 (H) 09/28/2024 0124   CALCIUM  7.5 (L) 09/28/2024 0124   CALCIUM  8.7 12/11/2023 0623   GFRNONAA 3 (L) 09/28/2024 0124   GFRAA 17 (L) 09/02/2017 2149    INR    Component Value Date/Time   INR 1.0 09/23/2024 1312     Intake/Output Summary (Last 24 hours) at 09/28/2024 1239 Last data filed at 09/28/2024 1044 Gross per 24 hour  Intake 687 ml  Output 1700 ml  Net -1013 ml     Assessment/Plan:  69 y.o. female now with ESRD in need of dialysis access  Hospital Day 2  -vein mapping has not resulted yet, however, plan for left arm access with AVF vs AVG.  Will also do intra-op ultrasound.  Will also place TDC.   Plan this for tomorrow.  Npo after MN/consent/labs/abx ordered -order placed to restrict LUE and move IV from left arm.  Lucie Apt, PA-C [facility] [phone] 09/28/2024 12:39 PM  "

## 2024-09-28 NOTE — Plan of Care (Signed)
 FMTS Interim Progress Note  S: Catherine Aguilar is a 68 YO Female w/ PMHx of DMII, HTN, CKD stage V, anemia of chronic disease, COPD, Wegener's granulomatosis w/ renal involvement. She has been admitted for CHF exacerbation. Pt recently started iHD yesterday and had a second iHD session today. During the iHD she become Tachyapnea. CXR show no evidence of pulmonary edema. ABG shows respiratory alkalosis. RRT was called. Per critical care note concerned for Dialysis disequilibrium syndrome (DDS) possible too-rapid correction of uremia.   O: BP (!) 167/77 (BP Location: Left Arm)   Pulse 93   Temp 98.2 F (36.8 C) (Oral)   Resp 17   Ht 5' 7 (1.702 m)   Wt 82 kg   SpO2 100%   BMI 28.31 kg/m   Physical Exam Cardiovascular:     Rate and Rhythm: Normal rate.  Pulmonary:     Breath sounds: Examination of the right-upper field reveals wheezing. Examination of the left-upper field reveals wheezing. Examination of the right-lower field reveals decreased breath sounds. Examination of the left-lower field reveals decreased breath sounds. Decreased breath sounds and wheezing present.  Psychiatric:        Mood and Affect: Mood is anxious.        Behavior: Behavior is agitated.     Comments: Improved aggitation      A/P: Received 0.25 mg IV Ativan  at 20.46 and PRN Atarax  10 mg at 2107. Patient appeared less agitate compared to prior.  RT at bedside for BiPaP adjusting Hemodynamically stable. Alert and Oriented Will continue to monitor  Suzen Houston NOVAK, DO 09/28/2024, 9:47 PM PGY-1, [facility] Service pager [phone]

## 2024-09-28 NOTE — Assessment & Plan Note (Addendum)
 2.08 on admission; unclear the significance of this given patient history. No erythema, pain or edema in either LE on exam. Likely 2/2 volume overload but will workup as below.  - Pending DVT ultrasound - Will hold off on V/Q scan for now  - Heparin  added back given low suspicion for bleed

## 2024-09-28 NOTE — Plan of Care (Cosign Needed)
 FMTS Interim Progress Note  S: Ms. Oglesby is a 69 YO Female w/ PMHx of DMII, HTN, CKD stage V, anemia of chronic disease, COPD, Wegener's granulomatosis w/ renal involvement. She has been admitted for CHF exacerbation. Pt recently started iHD yesterday and had a second iHD session today. During the iHD she become Tachyapnea. CXR show no evidence of pulmonary edema. ABG shows respiratory alkalosis. RRT was called. Per critical care note c/o Dialysis disequilibrium syndrome (DDS) :possible too-rapid correction of uremia.   O: BP (!) 167/77 (BP Location: Left Arm)   Pulse 90   Temp 98.2 F (36.8 C) (Oral)   Resp 17   Ht 5' 7 (1.702 m)   Wt 82 kg   SpO2 100%   BMI 28.31 kg/m   Physical Exam Pulmonary:     Effort: Tachypnea present.     Breath sounds: Examination of the right-upper field reveals wheezing. Examination of the left-upper field reveals wheezing. Wheezing present.  Neurological:     Mental Status: She is oriented to person, place, and time.  Psychiatric:        Behavior: Behavior is agitated.      A/P: Pt very anxious during round. SOB on 4L [place]. State feel uncomfortable on BiPap so she took of the mask.  - Continue on BiPap PRN - 0.25 mg Ativan  once to help w/ Anxiety  - Continue Atarax  10 mg TID PRN for anxiety  Suzen Houston NOVAK, DO 09/28/2024, 8:37 PM PGY-1, [facility] Service pager [phone]

## 2024-09-28 NOTE — Consult Note (Signed)
 "  NAMEChenee Aguilar, MRN:  [id number], DOB:  01/14/1956, LOS: 2 ADMISSION DATE:  09/25/2024, CONSULTATION DATE:  09/28/24 REFERRING MD:  Donzetta Rollene BRAVO, MD, CHIEF COMPLAINT:  dyspnea   History of Present Illness:  69 year old female with CKD 5, type 2 diabetes, history of stroke. Admitted 2 days ago for volume overload in setting of AKI on CKD 5, treated with initiation of long-term intermittent HD. Today she had a change in clinical status characterized by dyspnea and evidence of increased work of breathing. Rapid response was called. BiPAP was attempted but the patient couldn't tolerate the mask for more than 15 minutes. CXR showed no apparent worsening of pulmonary edema, perhaps interval development of LLL atelectasis or infiltrate. VBG with low CO2. EKG with interval development of sinus tachycardia, TWI aVL. Echo from yesterday shows preserved LV EF with hyperdynamic function, grade 1 diastolic dysfunction, normal RV function, right atrial pressure of 8 mmHg. Since admission she has received 2 dialysis treatments, with the last one cut short for shortness of breath, and total of 2400 mL ultrafiltrate. Heparin  was started empirically for PE. A dose of solumedrol was ordered. PCCM was consulted for evaluation.  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   CVA (cerebral vascular accident) (HCC) 07/03/2012   Diffuse pulmonary alveolar hemorrhage    Edema of right lower extremity 03/25/2017   Elevated d-dimer 09/26/2024   Essential hypertension 12/08/2016   Hemoptysis 12/08/2016   High cholesterol     HLD (hyperlipidemia) 12/08/2016   Hx of adenomatous polyp of colon 04/16/2017   Hypertension    stopped taking medications 10 years ago   Hypokalemia 07/02/2012   Intra-alveolar hemorrhage 12/08/2016   Left facial numbness 07/02/2012   Sepsis (HCC) 12/08/2016   Shortness of breath 12/02/2017   Stroke (HCC)    Substance abuse (HCC) 12/08/2016   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   Tobacco abuse 12/08/2016   Type 2 diabetes mellitus without complication, with long-term current use of insulin  (HCC) 02/11/2017   Wegener's granulomatosis with renal involvement (HCC) 12/08/2016   Patient Active Problem List   Diagnosis Date Noted   Demand ischemia (HCC) 09/27/2024   Cardiorenal syndrome with renal failure 09/26/2024   Volume overload 09/26/2024   Chronic health problem 09/26/2024   Elevated d-dimer 09/26/2024   Elevated troponin 09/26/2024   CAP (community acquired pneumonia) 08/17/2024   Anemia of chronic kidney failure, stage 5 (HCC) 05/17/2024   Positive RPR test 12/11/2023   History of anemia due to chronic kidney disease 12/10/2023   COPD with acute exacerbation (HCC) 12/10/2023   Chronic rhinitis 11/11/2020   COPD (chronic obstructive pulmonary disease) (HCC) 11/25/2017   Hx of adenomatous polyp of colon 04/16/2017   Chronic kidney disease (CKD), stage IV (severe) (HCC) 02/11/2017   DM2 (diabetes mellitus, type 2) (HCC) 02/11/2017   Essential hypertension 12/08/2016   HLD (hyperlipidemia) 12/08/2016   Granulomatosis with polyangiitis (HCC) 12/08/2016   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  Interim History / Subjective:  She responds to yes/no questions. She reports shortness of breath. No cough.  Objective   Blood pressure (!) 179/74, pulse (!) 104, temperature 98 F (36.7 C), resp. rate 18, height 5' 7 (1.702 m), weight 82 kg, SpO2 100%.    2 liter/min nasal cannula   Intake/Output Summary (Last 24 hours) at 09/28/2024 1332 Last data filed at 09/28/2024 1044 Gross per 24 hour  Intake 687 ml  Output 1700 ml  Net -1013 ml   Filed Weights   09/25/24 2126 09/26/24 2345 09/27/24 0200  Weight: 83 kg 83 kg 82 kg    Examination: Uncomfortable-appearing Regular tachycardia, strong radial pulse, no apparent murmur, warm extremities Breathing is somewhat rapid, breath sounds are diminished throughout, scant upper airway wheeze noted, no crackles Abdomen is soft and non-tender Skin warm and dry No calf tenderness bilaterally Alert and oriented to person and place, no facial asymmetry, voice is hoarse, moving all extremities, tremor and asterixis noted  Labs: 7.49/34/40 on VBG Electrolytes okay BUN/creatinine 69/7.34 Troponin 316, stable from 2 days ago Glucose 112  Imaging: CXR per above  Resolved Hospital Problem list   Resolved Problems:   * No resolved hospital problems. *   Assessment & Plan:  Principal Problem:   Acute kidney injury superimposed on stage 5 chronic kidney disease, not on chronic dialysis (HCC) Active Problems:   Essential hypertension   Chronic kidney disease (CKD), stage IV (severe) (HCC)   DM2 (diabetes mellitus, type 2) (HCC)   COPD (chronic obstructive pulmonary disease) (HCC)   Anemia of chronic kidney failure, stage 5 (HCC)   Cardiorenal syndrome with renal failure   Volume overload   Chronic health problem   Elevated d-dimer   Elevated troponin   Demand ischemia (HCC)  Dyspnea COPD Without hypoxic or hypercapnic respiratory failure at this time. PE is possible  but lack of hypoxia makes this less likely. Volume status improving with HD, not pulmonary edema. PTX or large effusion ruled out. Considered bronchospasm given her COPD history and diminished-sounding lungs but CO2 is low on VBG. Safe to continue monitoring on floor/progressive for now. Will re-assess later this afternoon.  AKI on CKD 5 With volume overload and uremia. Improving since admission with iHD start. She has signs of uremic encephalopathy on exam. At this juncture it's safe to continue supportive management for encephalopathy on the floor. -iHD per nephrology  Will reassess this afternoon. Available as needed if her condition deteriorates.  Ozell Kung MD 09/28/2024, 2:37 PM        "

## 2024-09-28 NOTE — Progress Notes (Signed)
 PHARMACY - ANTICOAGULATION CONSULT NOTE  Pharmacy Consult for Heparin  Indication: pulmonary embolus  Allergies[1]  Patient Measurements: Height: 5' 7 (170.2 cm) Weight: 82 kg (180 lb 12.4 oz) IBW/kg (Calculated) : 61.6 HEPARIN  DW (KG): 78.8  Vital Signs: Temp: 98 F (36.7 C) (01/19 1153) Temp Source: Oral (01/19 1044) BP: 179/74 (01/19 1153) Pulse Rate: 104 (01/19 1153)  Labs: Recent Labs    09/26/24 0343 09/27/24 0401 09/28/24 0124  HGB 7.3* 7.3* 7.3*  HCT 22.8* 22.0* 22.8*  PLT 133* 164 170  CREATININE 14.40* 10.40* 11.30*    Estimated Creatinine Clearance: 5.2 mL/min (A) (by C-G formula based on SCr of 11.3 mg/dL (H)).   Medical 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   CVA (cerebral vascular accident) (HCC) 07/03/2012   Diffuse pulmonary alveolar hemorrhage    Edema of right lower extremity 03/25/2017   Elevated d-dimer 09/26/2024   Essential hypertension 12/08/2016   Hemoptysis 12/08/2016   High cholesterol    HLD (hyperlipidemia) 12/08/2016   Hx of adenomatous polyp of colon 04/16/2017   Hypertension    stopped taking medications 10 years ago   Hypokalemia 07/02/2012   Intra-alveolar hemorrhage 12/08/2016   Left facial numbness 07/02/2012   Sepsis (HCC) 12/08/2016   Shortness of breath 12/02/2017   Stroke (HCC)    Substance abuse (HCC) 12/08/2016   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   Tobacco abuse 12/08/2016   Type 2 diabetes mellitus without complication, with long-term current use of insulin  (HCC) 02/11/2017   Wegener's  granulomatosis with renal involvement (HCC) 12/08/2016    Medications:  Medications Prior to Admission  Medication Sig Dispense Refill Last Dose/Taking   albuterol  (PROVENTIL ) (2.5 MG/3ML) 0.083% nebulizer solution Take 3 mLs (2.5 mg total) by nebulization every 6 (six) hours as needed for wheezing or shortness of breath. 150 mL 1 09/25/2024 Evening   albuterol  (VENTOLIN  HFA) 108 (90 Base) MCG/ACT inhaler INHALE 2 PUFFS BY MOUTH EVERY 4-6 HOURS (Patient taking differently: Inhale 2 puffs into the lungs See admin instructions. INHALE 2 PUFFS INTO THE LUNGS EVERY 4-6 HOURS) 8.5 each 2 09/25/2024 Evening   amLODipine  (NORVASC ) 10 MG tablet Take 10 mg by mouth at bedtime.   09/25/2024 Evening   aspirin  81 MG EC tablet TAKE 1 TABLET BY MOUTH EVERY DAY (Patient taking differently: Take 81 mg by mouth daily.) 32 tablet 12 09/25/2024 Morning   azelastine  (ASTELIN ) 0.1 % nasal spray Place 2 sprays into both nostrils 2 (two) times daily. Use in each nostril as directed (Patient taking differently: Place 2 sprays into both nostrils 2 (two) times daily as needed for rhinitis or allergies.) 30 mL 12 Past Week   benzonatate  (TESSALON ) 200 MG capsule TAKE 1 CAPSULE (200 MG TOTAL) BY MOUTH 3 (THREE) TIMES DAILY AS NEEDED FOR COUGH. 30 capsule 1 09/25/2024 Evening   doxazosin  (CARDURA ) 2 MG tablet Take 2 mg by mouth in the morning and at bedtime.   09/25/2024 Evening   insulin  glargine (LANTUS  SOLOSTAR) 100 UNIT/ML Solostar Pen Inject 5 Units into the skin at bedtime. 15 mL 1 09/25/2024 Evening  labetalol  (NORMODYNE ) 300 MG tablet Take 300 mg by mouth in the morning and at bedtime.   09/25/2024 Evening   pravastatin  (PRAVACHOL ) 40 MG tablet Take 40 mg by mouth at bedtime.   09/25/2024 Evening   sodium bicarbonate  650 MG tablet Take 2 tablets (1,300 mg total) by mouth 2 (two) times daily. 60 tablet 1 09/25/2024 Evening   torsemide (DEMADEX) 100 MG tablet Take 100 mg by mouth in the morning.   09/25/2024 Morning   B-D UF III  MINI PEN NEEDLES 31G X 5 MM MISC USE AS DIRECTED 100 each 0    budesonide -glycopyrrolate -formoterol  (BREZTRI  AEROSPHERE) 160-9-4.8 MCG/ACT AERO inhaler Inhale 2 puffs into the lungs in the morning and at bedtime. (Patient not taking: Reported on 08/17/2024) 10.7 g 3 Not Taking   fluticasone  (FLONASE ) 50 MCG/ACT nasal spray Place 2 sprays into both nostrils daily. (Patient not taking: Reported on 08/17/2024) 16 g 2 Not Taking   glucose blood (ACCU-CHEK GUIDE) test strip Check blood glucose prior to lantus  administration 100 each 12    guaiFENesin  (ROBITUSSIN) 100 MG/5ML liquid Take 5 mLs by mouth every 4 (four) hours as needed for cough or to loosen phlegm. (Patient not taking: No sig reported)   Not Taking   Lancets Misc. (ACCU-CHEK SOFTCLIX LANCET DEV) KIT Check BG prior to administering insulin  1 kit 3    Spacer/Aero-Holding Chambers DEVI Please use with all inhaled medications 2 each 0    Scheduled:   amLODipine   10 mg Oral Daily   budesonide -glycopyrrolate -formoterol   2 puff Inhalation Daily   carvedilol   25 mg Oral BID WC   Chlorhexidine  Gluconate Cloth  6 each Topical Daily   doxazosin   2 mg Oral Daily   hydrALAZINE   100 mg Oral Q8H   insulin  aspart  0-15 Units Subcutaneous TID WC   ipratropium-albuterol   3 mL Nebulization Q4H   methylPREDNISolone  (SOLU-MEDROL ) injection  125 mg Intravenous NOW   methylPREDNISolone  sodium succinate       pravastatin   40 mg Oral QHS   sodium chloride  flush  10-40 mL Intracatheter Q12H   Infusions:   [START ON 09/29/2024]  ceFAZolin  (ANCEF ) IV     heparin      PRN: acetaminophen  **OR** acetaminophen , benzonatate , hydrOXYzine , ipratropium-albuterol , methylPREDNISolone  sodium succinate, mouth rinse, sodium chloride  flush  Assessment: 69 YO F with significant PMH for T2DM, HTN, CKD stage V, anemia of chronic disease, COPD, Wegener's granulomatosis with renal involvement who is admitted for CHF exacerbation. Of note patient recently on heparin  prophylaxis,  but was held due to drop in hemoglobin.  Pharmacy consulted for Heparin  for pulmonary embolism.  Baseline Hgb 7.3, Plt 170  Goal of Therapy:  Heparin  level 0.3-0.7 units/ml Monitor platelets by anticoagulation protocol: Yes   Plan:  Give 6000 units bolus x 1 Start heparin  infusion at 1400 units/hr Check anti-Xa level in 8 hours and daily while on heparin  Continue to monitor H&H and platelets  R. Samual Satterfield, PharmD PGY-1 Acute Care Pharmacy Resident [facility] Please refer to [facility] for [facility] numbers 09/28/2024 1:13 PM    [1] No Known Allergies

## 2024-09-28 NOTE — Assessment & Plan Note (Addendum)
 Remains elevated. - Cardiology consulted, appreciate recommendations - Increase Carvedilol  to 25mg  BID, continue Hydralazine  100 mg TID - Continue home Amlodipine  10 mg daily and Doxazosin  2 mg daily

## 2024-09-28 NOTE — Progress Notes (Signed)
 Patient ID: Catherine Aguilar, female   DOB: 1956/01/03, 69 y.o.   MRN: [id number] I was asked to provide written permission to allow for contrasted CT scan of the chest for evaluation of pulmonary embolism in this patient with worsening respiratory status who is new to dialysis. The benefits of this procedure outweigh the risks and I permit contrasted study.  Gordy Blanch MD [facility]. Office # [phone] Pager # [phone] 4:27 PM

## 2024-09-28 NOTE — Telephone Encounter (Signed)
 Patient Product/process Development Scientist completed.    The patient is insured through [facility]. Patient has Medicare and is not eligible for a copay card, but may be able to apply for patient assistance or Medicare RX Payment Plan (Patient Must reach out to their plan, if eligible for payment plan), if available.    Ran test claim for Eliquis 5 mg and the current 30 day co-pay is $4.90.   This test claim was processed through [facility]- copay amounts may vary at other pharmacies due to pharmacy/plan contracts, or as the patient moves through the different stages of their insurance plan.     Reyes Sharps, CPHT Pharmacy Technician Patient Advocate Specialist Lead [facility] Patient Advocate Team Direct Number: [phone]  Fax: [phone]

## 2024-09-28 NOTE — Progress Notes (Signed)
 Pt brought to 4E from 31M. Telemetry box applied, CCMD notified. Patient oriented to room and staff. Call bell in reach.   09/28/24 1438  Vitals  Temp (!) 101.3 F (38.5 C)  Temp Source Oral  BP (!) 154/67  MAP (mmHg) 92  BP Location Right Arm  BP Method Automatic  Patient Position (if appropriate) Lying  Pulse Rate 92  Pulse Rate Source Monitor  ECG Heart Rate 92  MEWS COLOR  MEWS Score Color Green  Oxygen Therapy  SpO2 97 %  O2 Device Nasal Cannula  O2 Flow Rate (L/min) 2 L/min  MEWS Score  MEWS Temp 1  MEWS Systolic 0  MEWS Pulse 0  MEWS RR 0  MEWS LOC 0  MEWS Score 1

## 2024-09-28 NOTE — Assessment & Plan Note (Addendum)
 Patient wheezes at baseline. - Continue home Breztri  inhaler 2 puffs daily  - Continue DuoNebs q4hrs PRN - SCH + PRN - Benzonatate  200 mg TID PRN for cough

## 2024-09-28 NOTE — Procedures (Signed)
 Patient seen on Hemodialysis. BP (!) 154/70 (BP Location: Right Arm)   Pulse 85   Temp 98.9 F (37.2 C) (Oral)   Resp 16   Ht 5' 7 (1.702 m)   Wt 82 kg   SpO2 97%   BMI 28.31 kg/m   QB 250, UF goal 1.5L Complains of feeling restless on HD, no pain and improving shortness of breath.   Gordy Blanch MD [facility]. Office # [phone] Pager # [phone] 9:24 AM

## 2024-09-28 NOTE — Progress Notes (Signed)
 RT took patient off bipap due to patient continuously taking mask off. Patient still having some increased WOB but is not tolerating bipap at the moment. Rapid response bedside. Patient currently on 2L [place].

## 2024-09-28 NOTE — Significant Event (Addendum)
 Rapid Response Event Note   Reason for Call :  Respiratory distress, wheezing  Initial Focused Assessment:  Patient in bed, alert. Upper airway stridor noted. Diminished breath sounds in bilateral lung bases.Tachypneic, nasal flaring, tripoding, accessory muscle use. Hoarse voice. Speaking in 1-2 word sentences. Skin is warm, dry, pink.   T 25F, BP 179/74, HR 104, RR 18, SpO2 95% on 1LNC CBG: 112  Interventions:  -STAT CXR -STAT labs - VBG: 7.49/34/ 40/ 26.1 -BiPAP, per RT -Transfer to PCU -125mg  IV Solu-medrol   Plan of Care:  -Increased work of breathing, not tolerating BiPAP. Patient tolerated BiPAP for no more than 15 minutes. She was persistently pulling at the mask causing the BiPAP to continuously alarm and pause ventilation. She was becoming increasingly distressed and tachypneic while staff was trying to coach her to keep the BiPAP on. BiPAP removed and patient placed on 2LNC, SpO2 99%. -Primary team starting Heparin  therapy for concern for PE.   Event Summary:  MD Notified: FMTS MD Call Time: 1226 Arrival Time: 1236 End Time: 1400  Leonor LITTIE Danker, RN

## 2024-09-28 NOTE — Progress Notes (Signed)
 RT assessed patient with D.O. and nurse, and explained the importance of wearing the Bipap. Patient did not want to be compliant with her Night time therapy to help with the pneumonia. D.O. is aware. Will try and attempt for her to wear it once her anxiety goes down.

## 2024-09-28 NOTE — Progress Notes (Signed)
 RT called bedside by secretary stating RN asked for RT. Upon arrival patient seemed to have increased WOB. Rapid response arrived shortly after RT as well as physician. RT placed patient on bipap per MD bedside. Patient tolerating well at this time.

## 2024-09-29 ENCOUNTER — Inpatient Hospital Stay (HOSPITAL_COMMUNITY)

## 2024-09-29 ENCOUNTER — Encounter (HOSPITAL_COMMUNITY): Payer: Self-pay | Admitting: Anesthesiology

## 2024-09-29 ENCOUNTER — Encounter (HOSPITAL_COMMUNITY): Payer: Self-pay | Admitting: Family Medicine

## 2024-09-29 DIAGNOSIS — J189 Pneumonia, unspecified organism: Secondary | ICD-10-CM | POA: Diagnosis not present

## 2024-09-29 DIAGNOSIS — R0600 Dyspnea, unspecified: Secondary | ICD-10-CM

## 2024-09-29 DIAGNOSIS — I120 Hypertensive chronic kidney disease with stage 5 chronic kidney disease or end stage renal disease: Secondary | ICD-10-CM

## 2024-09-29 DIAGNOSIS — J3801 Paralysis of vocal cords and larynx, unilateral: Secondary | ICD-10-CM | POA: Insufficient documentation

## 2024-09-29 DIAGNOSIS — J383 Other diseases of vocal cords: Secondary | ICD-10-CM | POA: Insufficient documentation

## 2024-09-29 DIAGNOSIS — J188 Other pneumonia, unspecified organism: Secondary | ICD-10-CM | POA: Diagnosis not present

## 2024-09-29 DIAGNOSIS — R609 Edema, unspecified: Secondary | ICD-10-CM

## 2024-09-29 DIAGNOSIS — B9562 Methicillin resistant Staphylococcus aureus infection as the cause of diseases classified elsewhere: Secondary | ICD-10-CM | POA: Diagnosis not present

## 2024-09-29 DIAGNOSIS — R0602 Shortness of breath: Secondary | ICD-10-CM

## 2024-09-29 DIAGNOSIS — R7881 Bacteremia: Secondary | ICD-10-CM

## 2024-09-29 DIAGNOSIS — N179 Acute kidney failure, unspecified: Secondary | ICD-10-CM | POA: Diagnosis not present

## 2024-09-29 DIAGNOSIS — N185 Chronic kidney disease, stage 5: Secondary | ICD-10-CM | POA: Diagnosis not present

## 2024-09-29 DIAGNOSIS — J449 Chronic obstructive pulmonary disease, unspecified: Secondary | ICD-10-CM | POA: Diagnosis not present

## 2024-09-29 DIAGNOSIS — N186 End stage renal disease: Secondary | ICD-10-CM | POA: Diagnosis not present

## 2024-09-29 LAB — RENAL FUNCTION PANEL
Albumin: 3.8 g/dL (ref 3.5–5.0)
Anion gap: 20 — ABNORMAL HIGH (ref 5–15)
BUN: 85 mg/dL — ABNORMAL HIGH (ref 8–23)
CO2: 21 mmol/L — ABNORMAL LOW (ref 22–32)
Calcium: 8.1 mg/dL — ABNORMAL LOW (ref 8.9–10.3)
Chloride: 90 mmol/L — ABNORMAL LOW (ref 98–111)
Creatinine, Ser: 8.79 mg/dL — ABNORMAL HIGH (ref 0.44–1.00)
GFR, Estimated: 4 mL/min — ABNORMAL LOW
Glucose, Bld: 105 mg/dL — ABNORMAL HIGH (ref 70–99)
Phosphorus: 7.2 mg/dL — ABNORMAL HIGH (ref 2.5–4.6)
Potassium: 4 mmol/L (ref 3.5–5.1)
Sodium: 131 mmol/L — ABNORMAL LOW (ref 135–145)

## 2024-09-29 LAB — URINALYSIS, ROUTINE W REFLEX MICROSCOPIC
Bilirubin Urine: NEGATIVE
Glucose, UA: NEGATIVE mg/dL
Ketones, ur: NEGATIVE mg/dL
Nitrite: NEGATIVE
Protein, ur: >=300 mg/dL — AB
Specific Gravity, Urine: 1.01 (ref 1.005–1.030)
pH: 6 (ref 5.0–8.0)

## 2024-09-29 LAB — BLOOD CULTURE ID PANEL (REFLEXED) - BCID2

## 2024-09-29 LAB — GLUCOSE, CAPILLARY
Glucose-Capillary: 125 mg/dL — ABNORMAL HIGH (ref 70–99)
Glucose-Capillary: 115 mg/dL — ABNORMAL HIGH (ref 70–99)
Glucose-Capillary: 105 mg/dL — ABNORMAL HIGH (ref 70–99)
Glucose-Capillary: 86 mg/dL (ref 70–99)

## 2024-09-29 LAB — MRSA NEXT GEN BY PCR, NASAL: MRSA by PCR Next Gen: DETECTED — AB

## 2024-09-29 LAB — CBC
HCT: 23.3 % — ABNORMAL LOW (ref 36.0–46.0)
Hemoglobin: 7.6 g/dL — ABNORMAL LOW (ref 12.0–15.0)
MCH: 26.3 pg (ref 26.0–34.0)
MCHC: 32.6 g/dL (ref 30.0–36.0)
MCV: 80.6 fL (ref 80.0–100.0)
Platelets: 166 K/uL (ref 150–400)
RBC: 2.89 MIL/uL — ABNORMAL LOW (ref 3.87–5.11)
RDW: 17.5 % — ABNORMAL HIGH (ref 11.5–15.5)
WBC: 36.8 K/uL — ABNORMAL HIGH (ref 4.0–10.5)
nRBC: 0.1 % (ref 0.0–0.2)

## 2024-09-29 MED ORDER — LORAZEPAM 2 MG/ML IJ SOLN
INTRAMUSCULAR | Status: AC
  Filled 2024-09-29: qty 1

## 2024-09-29 MED ORDER — HEPARIN SODIUM (PORCINE) 5000 UNIT/ML IJ SOLN
5000.0000 [IU] | Freq: Three times a day (TID) | INTRAMUSCULAR | Status: DC
  Administered 2024-09-29 – 2024-10-07 (×22): 5000 [IU] via SUBCUTANEOUS
  Filled 2024-09-29 (×22): qty 1

## 2024-09-29 MED ORDER — ACETAMINOPHEN 10 MG/ML IV SOLN
1000.0000 mg | Freq: Once | INTRAVENOUS | Status: DC
  Filled 2024-09-29: qty 100

## 2024-09-29 MED ORDER — BUSPIRONE HCL 5 MG PO TABS
5.0000 mg | ORAL_TABLET | Freq: Once | ORAL | Status: AC
  Administered 2024-09-29: 5 mg via ORAL
  Filled 2024-09-29: qty 1

## 2024-09-29 MED ORDER — LORAZEPAM 2 MG/ML IJ SOLN
1.0000 mg | INTRAMUSCULAR | Status: AC
  Administered 2024-09-29: 1 mg via INTRAVENOUS
  Filled 2024-09-29: qty 1

## 2024-09-29 MED ORDER — CALCIUM ACETATE (PHOS BINDER) 667 MG PO CAPS
667.0000 mg | ORAL_CAPSULE | Freq: Three times a day (TID) | ORAL | Status: DC
  Administered 2024-09-30 – 2024-10-04 (×11): 667 mg via ORAL
  Filled 2024-09-29 (×11): qty 1

## 2024-09-29 MED ORDER — CARVEDILOL 12.5 MG PO TABS
12.5000 mg | ORAL_TABLET | Freq: Two times a day (BID) | ORAL | Status: DC
  Administered 2024-09-29 – 2024-10-08 (×16): 12.5 mg via ORAL
  Filled 2024-09-29 (×16): qty 1

## 2024-09-29 MED ORDER — RACEPINEPHRINE HCL 2.25 % IN NEBU
0.5000 mL | INHALATION_SOLUTION | RESPIRATORY_TRACT | Status: AC
  Administered 2024-09-29: 0.5 mL via RESPIRATORY_TRACT
  Filled 2024-09-29: qty 15

## 2024-09-29 MED ORDER — RACEPINEPHRINE HCL 2.25 % IN NEBU
0.5000 mL | INHALATION_SOLUTION | Freq: Once | RESPIRATORY_TRACT | Status: DC
  Filled 2024-09-29: qty 15

## 2024-09-29 MED ORDER — HEPARIN SODIUM (PORCINE) 1000 UNIT/ML DIALYSIS
40.0000 [IU]/kg | INTRAMUSCULAR | Status: DC | PRN
  Administered 2024-09-29: 3300 [IU] via INTRAVENOUS_CENTRAL

## 2024-09-29 MED ORDER — HYDROXYZINE HCL 25 MG PO TABS
25.0000 mg | ORAL_TABLET | Freq: Three times a day (TID) | ORAL | Status: DC | PRN
  Administered 2024-09-29 – 2024-10-06 (×9): 25 mg via ORAL
  Filled 2024-09-29 (×10): qty 1

## 2024-09-29 MED ORDER — HEPARIN SODIUM (PORCINE) 1000 UNIT/ML IJ SOLN
INTRAMUSCULAR | Status: AC
  Filled 2024-09-29: qty 4

## 2024-09-29 MED ORDER — AMOXICILLIN-POT CLAVULANATE 250-125 MG PO TABS
1.0000 | ORAL_TABLET | Freq: Two times a day (BID) | ORAL | Status: AC
  Administered 2024-09-29 – 2024-10-03 (×9): 1 via ORAL
  Filled 2024-09-29 (×9): qty 1

## 2024-09-29 MED ORDER — LORAZEPAM 2 MG/ML IJ SOLN
0.5000 mg | INTRAMUSCULAR | Status: AC | PRN
  Administered 2024-09-29 – 2024-10-05 (×2): 0.5 mg via INTRAVENOUS
  Filled 2024-09-29: qty 1

## 2024-09-29 MED ORDER — LORAZEPAM 2 MG/ML IJ SOLN: 1.0000 mg | Freq: Once | INTRAMUSCULAR | Status: DC

## 2024-09-29 NOTE — Assessment & Plan Note (Addendum)
 Cr 11.3 > 8.79, plan for dialysis today. S/p dialysis x2. 2000 total output, weight stable at 82kg. Echo with EF 70-75%, hyperdynamic function. - Nephrology consulted, appreciate recommendations. - VVS consulted, appreciate recommendations - TDC and AFV/G surgery cancelled pending improvement - Cardiology consulted, appreciate recommendations - Consider slower volume pulls with HD given hyperdynamic LV function - Tylenol  650 mg q6hrs PRN PO or rectal for pain - Avoid nephrotoxic agents - Strict I&Os, daily weights - AM CBC, RFP

## 2024-09-29 NOTE — Progress Notes (Signed)
 "    Daily Progress Note Intern Pager: [phone]  Patient name: Catherine Aguilar Medical record number: [id number] Date of birth: [birth year] Age: 69 y.o. Gender: female  Primary Care Provider: Adele Song, MD Consultants: Nephrology, cardiology, VVS, ENT Code Status: Full code  Pt Overview and Major Events to Date:  1/17 - admitted, temp HD catheter placed 1/18 - initiated dialysis 1/19 - dialysis; rapid response called x2 due to SOB and increased WOB 1/20 - rapid response called overnight for inc WOB, dialysis today  Assessment and Plan: Catherine Aguilar is a 69 y.o. F with PMHx of T2DM, HTN, CKD stage V, anemia of chronic disease, COPD, Wegener's granulomatosis with renal involvement who is admitted for CHF exacerbation.  She is diuresing well, and now s/p dialysis x2. Plan for [facility] and AVF/G with vascular pending improvement.   Rapid response called 1/19 while in dialysis for respiratory distress. Patient unable to tolerate BiPAP and maintained oxygen saturation with 2L [place]. CXR infiltrate concerning for pneumonia but otherwise no acute findings. Patient initiated on empiric abx given CXR findings, leukocytosis and fever. CTA chest later did not show PE but did confirm PNA. Patient continued with increased WOB and unable to tolerate BiPAP though able to maintain oxygen saturation on 2L [place]. Rapid response called again overnight due to worsening respiratory status. ABG reassuring, showed improvement after racemic epi x1 and ativan  x2. Resting comfortably this morning with 2L . Given presentation of sx without acute findings on workup and improvement with anxiolytic, suspect vocal cord dysfunction may be playing a role. See notes overnight for full details. Assessment & Plan Acute kidney injury superimposed on stage 5 chronic kidney disease, not on chronic dialysis (HCC) Volume overload Acute on chronic congestive heart failure (HCC) Cr 11.3 > 8.79, plan for dialysis today. S/p dialysis x2.  2000 total output, weight stable at 82kg. Echo with EF 70-75%, hyperdynamic function. - Nephrology consulted, appreciate recommendations. - VVS consulted, appreciate recommendations - TDC and AFV/G surgery cancelled pending improvement - Cardiology consulted, appreciate recommendations - Consider slower volume pulls with HD given hyperdynamic LV function - Tylenol  650 mg q6hrs PRN PO or rectal for pain - Avoid nephrotoxic agents - Strict I&Os, daily weights - AM CBC, RFP Multifocal pneumonia Febrile to 101.3 yesterday. WBC 16.1 > 36.8 though patient did get a dose of IV solumedrol yesterday. CXR with left basilar infiltrate. CTA chest with consolidation in LLL and inferior LUL compatible with multifocal pneumonia and secretions in LLL bronchus concerning for aspiration. - Started vanc and zosyn  1/19 for empiric treatment - MRSA swab positive so will continue treatment as above for now - Bcx NG < 12hrs - F/u sputum culture Suspected vocal cord dysfunction Suspect this may be playing a component in patient's increased WOB resulting in rapid response x2. Patient with hx of Wegener's granulomatosis of which this can be a manifestation. - ENT consulted, appreciate recommendations - Hydroxyzine  10mg  TID PRN Elevated d-dimer 2.08 on admission. DVT US  showed chronic superficial vein thrombosis involving the L small saphenous vein.  - Heparin  resumed given low suspicion for bleed - Vascular surgery following, will f/u recommendations COPD (chronic obstructive pulmonary disease) (HCC) Patient wheezes at baseline. - Continue home Breztri  inhaler 2 puffs daily  - Continue DuoNebs q4hrs PRN - SCH + PRN - Benzonatate  200 mg TID PRN for cough Anemia due to stage 5 chronic kidney disease (HCC) Hgb stable at 7.6. She has required blood transfusions in the past. - Transfusion threshold of <8 - Daily  CBC Essential hypertension Remains elevated. - Cardiology consulted, appreciate recommendations -  Decrease Carvedilol  back to 12.5 BID given decreased DBP after increasing, continue Hydralazine  100 mg TID - Continue home Amlodipine  10 mg daily and Doxazosin  2 mg daily Chronic health problem T2DM: hold nighttime Lantus  on admission, moderate SSI HLD: continue home Pravastatin  40 mg daily CAD: Holding ASA in the setting of anemia requiring recurrent transfusions per cardiology  FEN/GI: NPO, sips with meds PPx: Heparin  Dispo:Pending PT recommendations  pending clinical improvement .   Subjective:  Patient reports considerable improvement this morning. Discussed possibility of vocal cord dysfunction and she is agreeable to continued work-up. Patient initially without [place] on and encouraged her to continue.  Objective: Temp:  [98 F (36.7 C)-101.3 F (38.5 C)] 99.1 F (37.3 C) (01/20 0310) Pulse Rate:  [76-104] 93 (01/19 2015) Resp:  [14-31] 16 (01/20 0500) BP: (118-183)/(67-133) 160/91 (01/20 0500) SpO2:  [91 %-100 %] 100 % (01/20 0500) FiO2 (%):  [40 %] 40 % (01/19 1306) Weight:  [83.2 kg] 83.2 kg (01/20 0500)  Physical Exam: General: Awake, sitting up in bed, NAD Cardiovascular: RRR, systolic murmur appreciated Pulmonary: Minimally increased WOB on 2L [place]. Diminished breath sounds in lower lung bases but otherwise no wheezing or rales Extremities: Warm, no edema Neurologic: No focal deficits  Laboratory: Most recent CBC Lab Results  Component Value Date   WBC 16.1 (H) 09/28/2024   HGB 7.3 (L) 09/28/2024   HCT 22.8 (L) 09/28/2024   MCV 82.6 09/28/2024   PLT 170 09/28/2024   Most recent BMP    Latest Ref Rng & Units 09/28/2024    1:23 PM  BMP  Glucose 70 - 99 mg/dL 887   BUN 8 - 23 mg/dL 69   Creatinine 9.55 - 1.00 mg/dL 2.65   Sodium 864 - 854 mmol/L 133   Potassium 3.5 - 5.1 mmol/L 3.5   Chloride 98 - 111 mmol/L 93   CO2 22 - 32 mmol/L 24   Calcium  8.9 - 10.3 mg/dL 8.1     Other pertinent labs: UA hazy, small Hgb, large leukocytes, protein > 300, rare  bacteria MRSA PCR positive RVP negative   Imaging/Diagnostic Tests: VAS US  LOWER EXTREMITY VENOUS (DVT) Result Date: 09/29/2024 - No DVT in RLE - Chronic superficial vein thrombosis involving the L small saphenous vein  VAS US  UPPER EXT VEIN MAPPING (PRE-OP  AVF) Result Date: 09/29/2024  CT Angio Chest Pulmonary Embolism (PE) W or WO Contrast Result Date: 09/28/2024 1. No evidence of pulmonary embolism.  2. Airspace consolidation in the left lower lobe and inferior left upper lobe compatible with multifocal pneumonia. There are secretions in the left lower lobe bronchus concerning for aspiration.  3. Mild emphysema; pulmonary emphysema is an independent risk factor for lung cancer, recommend consideration for evaluation for a low-dose CT lung cancer screening program.  DG Chest Port 1 View Result Date: 09/28/2024 1. Cardiomegaly with mild pulmonary vascular congestion and mild interstitial edema.  2. Mild to moderate severity left basilar atelectasis and/or infiltrate.  3. Small left pleural effusion.   [name], DO 09/29/2024, 7:47 AM  PGY-1, [facility] Family Medicine FPTS Intern pager: [phone], text pages welcome Secure chat group [facility] Teaching Service   "

## 2024-09-29 NOTE — Plan of Care (Signed)
 FMTS Interim Progress Note  S:Called to bedside by nursing and RT for continued respiratory distress. Pt is unable to tolerated bipap and was trying to get neb treatment but reported no improvement. She reports feeling tired and being unable to breath. Rapid response nurse called to bedside as well and feel her stridor-like sounds are more consistent w/ vocal cord dysfunction.   O: BP (!) 167/86   Pulse 93   Temp 99.1 F (37.3 C) (Oral)   Resp (!) 31   Ht 5' 7 (1.702 m)   Wt 82 kg   SpO2 100%   BMI 28.31 kg/m   Gen: Acutely ill appearing and anxious woman speaking only in short sentences Resp: Wheezing diffusely, using accessory muscles.  A/P:  Respiratory Distress  Continues to have increasing WOB throughout the night, unable to tolerate bipap. Pt O2 sat and ABG reassuring, although I am worried pt is getting more and more exhausted with her WOB. Stridor-like breathing could be VCD, will try a dose of racemic epi to see if this offers relief. Pt may need further sedation to tolerate Bipap, but concern that this could further depress her resp drive and nursing on unit may not be able to monitor pt closely enough. Contact PCCM PA Rahul Desai, agrees w/ epi and recommends 1mg  ativan  to help pt tolerate Bipap. They plan to come assess pt.  - 1mg  ativan  - receiving racemic epi - PCCM consulted, appreciate recs    Elicia Hamlet, MD 09/29/2024, 4:38 AM PGY-3, [facility] Family Medicine Service pager [phone]

## 2024-09-29 NOTE — Progress Notes (Addendum)
 PCCM Brief Note  Called to see pt. Had some distress earlier apparently but satting 100% on 2L [place]. Placed on BiPAP to help with WOB but pt did not tolerate. Received 0.25mg  Ativan  and 10mg  Hydroxyzine  without much improvement.  I saw pt at bedside and she is watching TV, able to talk to me, just took some PO meds, is a bit anxious and appears somewhat annoyed. When asked if she has difficulty breathing she says yes and attributes it partially to anxiety.   BP (!) 159/97   Pulse 93   Temp 98.5 F (36.9 C) (Oral)   Resp 17   Ht 5' 7 (1.702 m)   Wt 82 kg   SpO2 100%   BMI 28.31 kg/m   On exam she has awake and alert. She has very very faint expiratory wheeze but loud upper airway expiratory > inspiratory wheeze and not really true stridor. Worse with deep breaths. She is on 2L and has sats 100%.  CXR and CTA reviewed > L > R PNA.   Seems like this could be VCD related.   Primary team has ordered 25mg  Hydroxyzine  x 1. I will add 5mg  Buspirone  x 1 for anxiety.  Would not place her on BiPAP as it would likely agitate her more and cause further anxiety. Do not think she needs repeat ABG or CXR  at this time (I have cancelled) and she does not need transfer to ICU.   ADDENDUM 0430:  Notified by primary team of distress again despite above meds. Still 100% on 2L [place]. Racemic epi was being administered. Advised to give 1mg  Ativan .  I called rapid response RN to discuss. RR RN agreed anxiety primary driver. RR RN to call me directly if no improvement or feels that she needs transfer for closer monitoring (he did not feel we were at that point yet).  Much improved after Ativan . I then checked back on her again around 0615 and discussed with bedside RN. She is sleeping comfortably currently with sats 100%. She has loud snoring but upper airway tightness is no longer appreciated (verified by RN). She does have slightly increased WOB with very mild accessory muscle use though. This is  out of proportion to her vitals and imaging etc.  Query whether she has some vocal cord edema/obstruction/spasm that has now relaxed after anxiolytics and now that pt is sleeping. Would not be unreasonable to have ENT evaluate with laryngoscopy. I have relayed this to primary team.  She likely has a component of OSA/OHS as well and would benefit from nocturnal CPAP though she may not tolerate this. Possibly with PRN anxiolytics but doubt she'd comply.  Given her breathing pattern, I will keep her on our rounding list and have our day team check in today to ensure she is still stable etc.   Sammi Gore, PA - [facility] [place] For pager details, please see AMION or use Epic chat  After 1900, please call [facility] for cross coverage needs 09/29/2024, 12:20 AM

## 2024-09-29 NOTE — Assessment & Plan Note (Signed)
 Cr 11.3 > 8.79, plan for dialysis today. S/p dialysis x2. 2000 total output, weight stable at 82kg. Echo with EF 70-75%, hyperdynamic function. - Nephrology consulted, appreciate recommendations. - VVS consulted, appreciate recommendations - TDC and AFV/G surgery cancelled pending improvement - Cardiology consulted, appreciate recommendations - Consider slower volume pulls with HD given hyperdynamic LV function - Tylenol  650 mg q6hrs PRN PO or rectal for pain - Avoid nephrotoxic agents - Strict I&Os, daily weights - AM CBC, RFP

## 2024-09-29 NOTE — Assessment & Plan Note (Addendum)
 Febrile to 101.3 yesterday. WBC 16.1 > 36.8 though patient did get a dose of IV solumedrol yesterday. CXR with left basilar infiltrate. CTA chest with consolidation in LLL and inferior LUL compatible with multifocal pneumonia and secretions in LLL bronchus concerning for aspiration. - Started vanc and zosyn  1/19 for empiric treatment - MRSA swab positive so will continue treatment as above for now - Bcx NG < 12hrs - F/u sputum culture

## 2024-09-29 NOTE — Progress Notes (Signed)
 Navigator attempted to meet with pt this afternoon. Pt currently receiving HD and staff working with pt as well. Staff have also documented some confusion for pt as well. Spoke to pt 's husband via phone. Introduced self and explained role. Pt resides in [place] and husband voiced interest in HD clinic close to their home on [address] ([facility] [place]). Husband advised that navigator can submit referral and request this clinic. Pt's husband agreeable. Pt's case discussed with nephrologist. Will plan to submit referral once pt's respiratory status is improved and pt more stable per nephrologist. Will follow and assist with HD arrangements.   Randine Mungo Dialysis Navigator [phone]

## 2024-09-29 NOTE — Assessment & Plan Note (Deleted)
 Elevated to 401 on admission, though trended flat ~350. LDL 35. Likely 2/2 demand ischemia from volume overload. Denies CP.  - Cardiology consulted, appreciate recommendations. - Holding off on ASA in the setting of anemia requiring recurrent transfusions per cardiology

## 2024-09-29 NOTE — Assessment & Plan Note (Addendum)
 Hgb stable at 7.6. She has required blood transfusions in the past. - Transfusion threshold of <8 - Daily CBC

## 2024-09-29 NOTE — Progress Notes (Signed)
" °   09/29/24 1820  Vitals  Temp (!) 100.6 F (38.1 C)  Pulse Rate 98  Resp (!) 22  BP (!) 155/78  SpO2 100 %  O2 Device Nasal Cannula  Weight (S)  77.6 kg (Bed Scale)  Type of Weight Post-Dialysis  Oxygen Therapy  O2 Flow Rate (L/min) 2 L/min  Patient Activity (if Appropriate) In bed  Pulse Oximetry Type Continuous  Post Treatment  Dialyzer Clearance Clear  Hemodialysis Intake (mL) 0 mL  Liters Processed 43.2  Fluid Removed (mL) 1400 mL  Tolerated HD Treatment Yes  Post-Hemodialysis Comments Pt. had 35 mins left on machine before thw machine clotted off. RN was able to returned patient blood without difficulties. UF goal was not met. Report call to 4E bedside RN. Admin medication per. order   Received patient in bed to unit.  Alert and oriented.  Informed consent signed and in chart.   TX duration: 2.25  Patient tolerated well.  Transported back to the room  Alert, without acute distress.  Hand-off given to patient's nurse.   Access used: Yes Access issues: No  Total UF removed: 1400, pt had 35 mins left before patient clotted off Medication(s) given: Ativan  0.5 mg IV per. order Post HD VS: See Above Grid Post HD weight: 77.6 kg   [name] "

## 2024-09-29 NOTE — Consult Note (Signed)
 "  NAMEJamieson Aguilar, MRN:  [id number], DOB:  1956-02-19, LOS: 3 ADMISSION DATE:  09/25/2024, CONSULTATION DATE:  09/29/24 REFERRING MD:  Donzetta Rollene BRAVO, MD, CHIEF COMPLAINT:  dyspnea   History of Present Illness:  69 year old female with CKD 5, type 2 diabetes, history of stroke. Admitted 3 days ago for volume overload in setting of AKI on CKD 5, treated with initiation of long-term intermittent HD. Today she had a change in clinical status characterized by dyspnea and evidence of increased work of breathing. Rapid response was called. BiPAP was attempted but the patient couldn't tolerate the mask for more than 15 minutes. CXR showed no apparent worsening of pulmonary edema, perhaps interval development of LLL atelectasis or infiltrate. VBG with low CO2. EKG with interval development of sinus tachycardia, TWI aVL. Echo from yesterday shows preserved LV EF with hyperdynamic function, grade 1 diastolic dysfunction, normal RV function, right atrial pressure of 8 mmHg. Since admission she has received 2 dialysis treatments, with the last one cut short for shortness of breath, and total of 2400 mL ultrafiltrate. Heparin  was started empirically for PE. A dose of solumedrol was ordered. PCCM was consulted for evaluation.  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   CVA (cerebral vascular accident) (HCC) 07/03/2012   Diffuse pulmonary alveolar hemorrhage    Edema of right lower extremity 03/25/2017   Elevated d-dimer 09/26/2024   Essential hypertension 12/08/2016   Hemoptysis 12/08/2016   High cholesterol     HLD (hyperlipidemia) 12/08/2016   Hx of adenomatous polyp of colon 04/16/2017   Hypertension    stopped taking medications 10 years ago   Hypokalemia 07/02/2012   Intra-alveolar hemorrhage 12/08/2016   Left facial numbness 07/02/2012   Sepsis (HCC) 12/08/2016   Shortness of breath 12/02/2017   Stroke (HCC)    Substance abuse (HCC) 12/08/2016   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   Tobacco abuse 12/08/2016   Type 2 diabetes mellitus without complication, with long-term current use of insulin  (HCC) 02/11/2017   Wegener's granulomatosis with renal involvement (HCC) 12/08/2016   Patient Active Problem List   Diagnosis Date Noted   Multifocal pneumonia 09/29/2024   Suspected vocal cord dysfunction 09/29/2024   MRSA bacteremia 09/29/2024   Acute on chronic congestive heart failure (HCC) 09/28/2024   Demand ischemia (HCC) 09/27/2024   Cardiorenal syndrome with renal failure 09/26/2024   Volume overload 09/26/2024   Chronic health problem 09/26/2024   Elevated d-dimer 09/26/2024   Elevated troponin 09/26/2024   CAP (community acquired pneumonia) 08/17/2024   Anemia due to stage 5 chronic kidney disease (HCC) 05/17/2024   Positive RPR test 12/11/2023   History of anemia due to chronic kidney disease 12/10/2023   COPD with acute exacerbation (HCC) 12/10/2023   Chronic rhinitis 11/11/2020   COPD (chronic obstructive pulmonary disease) (HCC) 11/25/2017   Hx of adenomatous polyp of colon 04/16/2017   Chronic kidney disease (CKD), stage IV (severe) (HCC) 02/11/2017   DM2 (diabetes mellitus, type 2) (HCC) 02/11/2017   Essential hypertension 12/08/2016   HLD (hyperlipidemia) 12/08/2016   Granulomatosis with polyangiitis (HCC)  12/08/2016   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 1/19 antibiotics started for multifocal pneumonia  Interim History / Subjective:  Pulmonary infiltrates > vanc/augmentin  for multifocal pneumonia More dyspnea overnight, treated once for stridor/vcd Bcx positive for MRSA Seen in HD, she's confused  Objective   Blood pressure 112/68, pulse 94, temperature 100.1 F (37.8 C), temperature source Oral, resp. rate 20, height 5' 7 (1.702 m), weight (S) 80.7 kg, SpO2 98%.    2 liter/min nasal cannula   Intake/Output Summary (Last 24 hours) at 09/29/2024 1556 Last data filed at 09/29/2024 0700 Gross per 24 hour  Intake 467.84 ml  Output 600 ml  Net -132.16 ml   Filed Weights   09/28/24 0500 09/29/24 0500 09/29/24 1505  Weight: 83.2 kg 83.2 kg (S) 80.7 kg    Examination: Uncomfortable-appearing Heart rate and rhythm normal, strong radial pulse, no apparent murmur, warm extremities Normal respiratory rate, no wheezing, no crackles anteriorly No stridor sounds noted Skin warm and dry Alert, not following instructions  Labs: Per above  Imaging: Per above  Resolved Hospital Problem list   Resolved Problems:   * No resolved hospital problems. *   Assessment & Plan:  Principal Problem:   MRSA bacteremia Active Problems:   Essential hypertension   Acute kidney injury superimposed on stage 5 chronic kidney disease, not on chronic dialysis (HCC)   Chronic kidney disease (CKD), stage IV (severe) (HCC)   DM2 (diabetes mellitus, type 2) (HCC)   COPD (chronic obstructive pulmonary disease) (HCC)   Anemia due to stage 5 chronic kidney disease (HCC)   Cardiorenal syndrome with renal failure   Volume overload   Chronic health problem   Elevated d-dimer   Elevated troponin   Demand ischemia  (HCC)   Acute on chronic congestive heart failure (HCC)   Multifocal pneumonia   Suspected vocal cord dysfunction  Dyspnea In setting of multifocal pneumonia, on appropriate antibiotics. Perhaps some vocal cord dysfunction which may account for stridor without respiratory failure overnight. No hypoxic or hypercapnic respiratory failure now. No PE or pulmonary edema. Continue management per primary.  MRSA bacteremia Without frank sepsis at time of my exam. Note leukocytosis but currently afebrile with normal RR and HR. Unfortunate development in her case. Recommend ID consult.  Delirium In setting of renal failure with new HD start and MRSA bacteremia with multifocal pneumonia.  AKI on CKD 5 With volume overload and uremia. Improving since admission with iHD start. She has signs of uremic encephalopathy on exam. At this juncture it's safe to continue supportive management for encephalopathy on the floor. -iHD per nephrology  No indication for transfer to ICU at this point without respiratory failure, shock, or delirium requiring sedative infusion. MRSA bacteremia is an unfortunate development for her. Hopefully she'll improve now on vancomycin  but should her condition worsens we will reassess.  Ozell Kung MD 09/29/2024, 3:56 PM        "

## 2024-09-29 NOTE — Progress Notes (Incomplete)
 Pt has had increased WOB throughout the shift, is anxious, and complaining of new onset of upper left quadrant abdominal pain of a 10 out of 10. Pt has had

## 2024-09-29 NOTE — Plan of Care (Signed)
 FMTS Interim Progress Note  S: Catherine Aguilar is a 69 YO Female w/ PMHx of DMII, HTN, CKD stage V, anemia of chronic disease, COPD, Wegener's granulomatosis w/ renal involvement. She has been admitted for CHF exacerbation. Pt recently started iHD yesterday and had a second iHD session today. During the iHD she become Tachyapnea. CXR show no evidence of pulmonary edema. ABG shows respiratory alkalosis. RRT was called. Per critical care note concerned for Dialysis disequilibrium syndrome (DDS) possible too-rapid correction of uremia.    Patient w/ anxiety, inc WOB throughout night. She remained on 3-4L [place]. ABG result was WNL  O: BP (!) 159/97   Pulse 93   Temp 98.5 F (36.9 C) (Oral)   Resp 17   Ht 5' 7 (1.702 m)   Wt 82 kg   SpO2 100%   BMI 28.31 kg/m   Physical Exam Pulmonary:     Breath sounds: Examination of the right-upper field reveals wheezing. Examination of the left-upper field reveals wheezing. Examination of the right-lower field reveals decreased breath sounds. Examination of the left-lower field reveals decreased breath sounds. Decreased breath sounds and wheezing present.  Abdominal:     Palpations: Abdomen is soft.  Psychiatric:        Mood and Affect: Mood is anxious.        Behavior: Behavior is agitated.      A/P: C/o abdominal pain and still w/ WOB. Per RN and RT patient has been SOB, increase WOB, anxious and agitate. She is also try to get out of bed to bedside commode. However her ABG is reassuring that she is not in respiratory distress  She received PO Buspar  x 1 given by ICU physician assistant when requested critical care recommendation on patient WOB and presentation earlier.  Will continue to monitor  Catherine Houston NOVAK, DO 09/29/2024, 2:14 AM PGY-1, [facility] pager [phone]

## 2024-09-29 NOTE — Progress Notes (Addendum)
" °  Progress Note    09/29/2024 7:08 AM Hospital Day 3  Subjective:  sleeping with some work of breathing. Pt evaluated overnight by rapid response for labored breathing  Tm 101.3 now 99.1  Vitals:   09/29/24 0424 09/29/24 0500  BP:  (!) 160/91  Pulse:    Resp:  16  Temp:    SpO2: 100% 100%    Physical Exam: General:  sleeping Lungs:  some work of breathing while sleeping; on [id number]   CBC    Component Value Date/Time   WBC 16.1 (H) 09/28/2024 0124   RBC 2.76 (L) 09/28/2024 0124   HGB 7.3 (L) 09/28/2024 0124   HGB 8.2 (L) 12/30/2023 1123   HCT 22.8 (L) 09/28/2024 0124   HCT 25.7 (L) 12/30/2023 1123   PLT 170 09/28/2024 0124   PLT 235 12/30/2023 1123   MCV 82.6 09/28/2024 0124   MCV 81 12/30/2023 1123   MCH 26.4 09/28/2024 0124   MCHC 32.0 09/28/2024 0124   RDW 17.0 (H) 09/28/2024 0124   RDW 16.4 (H) 12/30/2023 1123   LYMPHSABS 1.5 09/25/2024 2134   MONOABS 0.9 09/25/2024 2134   EOSABS 0.2 09/25/2024 2134   BASOSABS 0.1 09/25/2024 2134    BMET    Component Value Date/Time   NA 133 (L) 09/28/2024 1323   NA 138 01/07/2024 1507   K 3.5 09/28/2024 1323   CL 93 (L) 09/28/2024 1323   CO2 24 09/28/2024 1323   GLUCOSE 112 (H) 09/28/2024 1323   BUN 69 (H) 09/28/2024 1323   BUN 89 (HH) 01/07/2024 1507   CREATININE 7.34 (H) 09/28/2024 1323   CALCIUM  8.1 (L) 09/28/2024 1323   CALCIUM  8.7 12/11/2023 0623   GFRNONAA 6 (L) 09/28/2024 1323   GFRAA 17 (L) 09/02/2017 2149    INR    Component Value Date/Time   INR 1.0 09/23/2024 1312     Intake/Output Summary (Last 24 hours) at 09/29/2024 0708 Last data filed at 09/29/2024 0500 Gross per 24 hour  Intake 424.53 ml  Output 2000 ml  Net -1575.47 ml   CTA chest PE protocol 09/29/2023 -no evidence of PE, there is airspace consolidation in the LLL and inferior LUL compatible with multifocal pneumonia and concerns for aspiration in the LLL bronchus.     Assessment/Plan:  69 y.o. female now with ESRD in need of  dialysis access  Hospital Day 3  -pt with respiratory distress yesterday afternoon and overnight.  Rapid response evaluated pt this morning, she was upright in tripod position with labored breathing and audible inspiratory wheezes.  She was was treated with double duoneb followed by racemic epi and ativan .  She is currently sleeping.   -CTA shows no evidence of PE but evidence of multifocal pna and had fever yesterday afternoon of 101.3.  most likely surgery will be cancelled today and will place [facility] once infection improved.  She does have a temp catheter now.  If needed, can plan for permanent access as outpatient.     Lucie Apt, PA-C [facility] [phone] 09/29/2024 7:08 AM  I agree with the above.  I have seen and evaluated the patient.  I discussed that after last night's events, I do not think it is safe to proceed with surgery today.  If she continues to improve, I will have her on the schedule for Friday for left arm fistula versus graft and tunneled dialysis catheter  Catherine Aguilar  "

## 2024-09-29 NOTE — Assessment & Plan Note (Addendum)
 Patient wheezes at baseline. - Continue home Breztri  inhaler 2 puffs daily  - Continue DuoNebs q4hrs PRN - SCH + PRN - Benzonatate  200 mg TID PRN for cough

## 2024-09-29 NOTE — Consult Note (Signed)
 "        [facility]    Date of Admission:  09/25/2024     Total days of antibiotics 2  Vanc  Zosyn                Reason for Consult: MRSA BSI    Referring Provider: ANGELYN Autoconsulation  Primary Care Provider: Adele Song, MD   Assessment: Catherine Aguilar is a 69 y.o. female admitted with:   Nosocomial Acquired MRSA Bacteremia -  HD Line Insertion 11/18, Fever, acutely worsened leukocytosis and blood cultures drawn on 1/19 with early growth of MRSA.  Unfortunately, developed high fever > 101 F within a short time of HD catheter placement and leukocytosis. She will need her HD line removed when she can afford a holiday (pending nephro's call on that)  - Continue vancomycin   - Stop Zosyn   - Repeat blood cultures in 48h.  - Will need to have her HD line removed given this most likely represents CLABSI - Will need to re-evaluate for signs of metastatic infection when she is a little clearer - she is confused today at my assessment  - TTE reviewed from 1/18 - no findings that would suggest she had endocarditis on hospital entry   ARF -  ?Asp PNA -  Volume over load favored with medication non-compliance, blood transfusion recently and progressive CKD stage 5 with new HD start.  Concern for radiographic evidence of aspiration on CT  - can do 5d course of Augmentin  nightly (after HD on HD days at least)   ESRD -  Wegner's Granulomatosis -  Started HD after some hesitancy  Scheduled surgery for 1/23 for AVF creation - will need to hold while we treat blood stream infection and ensure clearance   Plan: - Continue vancomycin   - Stop Zosyn   - Repeat blood cultures in 48h (or when line out)  - Will need to have her HD line removed given this most likely represents CLABSI - Will need to re-evaluate for signs of metastatic infection when she is a little clearer - she is confused today at my assessment  - TTE reviewed from 1/18 - no findings that  would suggest she had endocarditis on hospital entry   - can do 5d course of Augmentin  nightly (after HD on HD days at least)  - Scheduled surgery for 1/23 for AVF creation - will need to hold while we treat blood stream infection and ensure clearance  Discussed with Dr. Fleeta Rothman and FMTS  Principal Problem:   Acute kidney injury superimposed on stage 5 chronic kidney disease, not on chronic dialysis [facility]) Active Problems:   Essential hypertension   Chronic kidney disease (CKD), stage IV (severe) (HCC)   DM2 (diabetes mellitus, type 2) (HCC)   COPD (chronic obstructive pulmonary disease) (HCC)   Anemia due to stage 5 chronic kidney disease (HCC)   Cardiorenal syndrome with renal failure   Volume overload   Chronic health problem   Elevated d-dimer   Elevated troponin   Demand ischemia (HCC)   Acute on chronic congestive heart failure (HCC)   Multifocal pneumonia   Suspected vocal cord dysfunction    amLODipine   10 mg Oral Daily   budesonide -glycopyrrolate -formoterol   2 puff Inhalation Daily   calcium  acetate  667 mg Oral TID WC   carvedilol   12.5 mg Oral BID WC   Chlorhexidine  Gluconate Cloth  6 each Topical Daily   doxazosin   2 mg Oral Daily   heparin   5,000 Units Subcutaneous Q8H   hydrALAZINE   100 mg Oral Q8H   insulin  aspart  0-15 Units Subcutaneous TID WC   ipratropium-albuterol   3 mL Nebulization Q4H   pravastatin   40 mg Oral QHS   sodium chloride  flush  10-40 mL Intracatheter Q12H   vancomycin  variable dose per unstable renal function (pharmacist dosing)   Does not apply See admin instructions    HPI: Catherine Aguilar is a 69 y.o. female admitted from home for evaluation of anemia (HgB 6.43) on 1/14 where she eloped after transfusion was given. Represented on 1/16 with worseend SOB   Seen on HD session - a bit confused. Trying to scratch at face. Worried she may dislodge line so restrained at present. Says she has zero pain anywhere.    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   CVA (cerebral vascular accident) (HCC) 07/03/2012   Diffuse pulmonary alveolar hemorrhage    Edema of right lower extremity 03/25/2017   Elevated d-dimer 09/26/2024   Essential hypertension 12/08/2016   Hemoptysis 12/08/2016   High cholesterol    HLD (hyperlipidemia) 12/08/2016   Hx of adenomatous polyp of colon 04/16/2017   Hypertension    stopped taking medications 10 years ago   Hypokalemia 07/02/2012   Intra-alveolar hemorrhage 12/08/2016   Left facial numbness 07/02/2012   Sepsis (HCC) 12/08/2016   Shortness of breath 12/02/2017   Stroke (HCC)    Substance abuse (HCC) 12/08/2016   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   Tobacco abuse 12/08/2016   Type 2 diabetes mellitus without complication, with long-term current use of insulin  (HCC) 02/11/2017   Wegener's granulomatosis with renal involvement (HCC) 12/08/2016    Social History[1]  Family History  Problem Relation Age of Onset   Heart attack Mother    Diabetes Mother    Hypertension Mother    Stroke Mother    Cancer Father    Alcohol abuse Father    Diabetes Father    Hypertension Father    Breast cancer Neg Hx    Colon cancer Neg Hx    Esophageal cancer Neg Hx    Rectal cancer Neg Hx    Stomach cancer Neg Hx    Pancreatic cancer Neg Hx    BRCA 1/2 Neg Hx    Allergies[2]  OBJECTIVE: Blood pressure (!) 141/73, pulse 82, temperature 100.1 F (37.8 C), temperature source Oral, resp. rate 15, height 5' 7 (1.702 m), weight 83.2 kg, SpO2  94%.  Physical Exam Constitutional:      Appearance: Normal appearance. She is not ill-appearing.  HENT:     Mouth/Throat:     Mouth: Mucous membranes are moist.     Pharynx: Oropharynx is clear.  Eyes:     General: No scleral icterus. Cardiovascular:     Rate and Rhythm: Normal rate.     Heart sounds: No murmur heard.    Comments: CV catheter in place, itching but nothing grossly infected appearing Pulmonary:     Effort: Pulmonary effort is normal. No tachypnea.  Skin:    General: Skin is  warm and dry.     Capillary Refill: Capillary refill takes less than 2 seconds.  Neurological:     General: No focal deficit present.     Mental Status: She is alert. She is disoriented.  Psychiatric:        Mood and Affect: Mood normal.        Thought Content: Thought content normal.     Lab Results Lab Results  Component Value Date   WBC 36.8 (H) 09/29/2024   HGB 7.6 (L) 09/29/2024   HCT 23.3 (L) 09/29/2024   MCV 80.6 09/29/2024   PLT 166 09/29/2024    Lab Results  Component Value Date   CREATININE 8.79 (H) 09/29/2024   BUN 85 (H) 09/29/2024   NA 131 (L) 09/29/2024   K 4.0 09/29/2024   CL 90 (L) 09/29/2024   CO2 21 (L) 09/29/2024    Lab Results  Component Value Date   ALT 50 (H) 09/25/2024   AST 27 09/25/2024   ALKPHOS 110 09/25/2024   BILITOT 0.4 09/25/2024     Microbiology: Recent Results (from the past 240 hours)  Resp panel by RT-PCR (RSV, Flu A&B, Covid) Anterior Nasal Swab     Status: None   Collection Time: 09/25/24  9:34 PM   Specimen: Anterior Nasal Swab  Result Value Ref Range Status   SARS Coronavirus 2 by RT PCR NEGATIVE NEGATIVE Final   Influenza A by PCR NEGATIVE NEGATIVE Final   Influenza B by PCR NEGATIVE NEGATIVE Final    Comment: (NOTE) The Xpert Xpress SARS-CoV-2/FLU/RSV plus assay is intended as an aid in the diagnosis of influenza from Nasopharyngeal swab specimens and should not be used as a sole basis for treatment. Nasal washings  and aspirates are unacceptable for Xpert Xpress SARS-CoV-2/FLU/RSV testing.  Fact Sheet for Patients: bloggercourse.com  Fact Sheet for Healthcare Providers: seriousbroker.it  This test is not yet approved or cleared by the [place]  FDA and has been authorized for detection and/or diagnosis of SARS-CoV-2 by FDA under an Emergency Use Authorization (EUA). This EUA will remain in effect (meaning this test can be used) for the duration of the COVID-19 declaration under Section 564(b)(1) of the Act, 21 U.S.C. section 360bbb-3(b)(1), unless the authorization is terminated or revoked.     Resp Syncytial Virus by PCR NEGATIVE NEGATIVE Final    Comment: (NOTE) Fact Sheet for Patients: bloggercourse.com  Fact Sheet for Healthcare Providers: seriousbroker.it  This test is not yet approved or cleared by the [place]  FDA and has been authorized for detection and/or diagnosis of SARS-CoV-2 by FDA under an Emergency Use Authorization (EUA). This EUA will remain in effect (meaning this test can be used) for the duration of the COVID-19 declaration under Section 564(b)(1) of the Act, 21 U.S.C. section 360bbb-3(b)(1), unless the authorization is terminated or revoked.  Performed at [facility], [address], [place], [place] [address]   Respiratory (~20 pathogens) panel by PCR     Status: None   Collection Time: 09/28/24  3:40 PM   Specimen: Nasopharyngeal Swab; Respiratory  Result Value Ref Range Status   Adenovirus NOT DETECTED NOT DETECTED Final   Coronavirus 229E NOT DETECTED NOT DETECTED Final    Comment: (NOTE) The Coronavirus on the Respiratory Panel, DOES NOT test for the novel  Coronavirus (2019 nCoV)    Coronavirus HKU1 NOT DETECTED NOT DETECTED Final   Coronavirus NL63 NOT DETECTED NOT DETECTED Final   Coronavirus OC43 NOT DETECTED NOT DETECTED Final  Metapneumovirus NOT DETECTED NOT DETECTED Final   Rhinovirus / Enterovirus NOT DETECTED NOT DETECTED Final   Influenza A NOT DETECTED NOT DETECTED Final   Influenza B NOT DETECTED NOT DETECTED Final   Parainfluenza Virus 1 NOT DETECTED NOT DETECTED Final   Parainfluenza Virus 2 NOT DETECTED NOT DETECTED Final   Parainfluenza Virus 3 NOT DETECTED NOT DETECTED Final   Parainfluenza Virus 4 NOT DETECTED NOT DETECTED Final   Respiratory Syncytial Virus NOT DETECTED NOT DETECTED Final   Bordetella pertussis NOT DETECTED NOT DETECTED Final   Bordetella Parapertussis NOT DETECTED NOT DETECTED Final   Chlamydophila pneumoniae NOT DETECTED NOT DETECTED Final   Mycoplasma pneumoniae NOT DETECTED NOT DETECTED Final    Comment: Performed at [facility], [address], [place], [place] [address]  Culture, blood (Routine X 2) w Reflex to ID Panel     Status: None (Preliminary result)   Collection Time: 09/28/24  7:00 PM   Specimen: BLOOD  Result Value Ref Range Status   Specimen Description BLOOD SITE NOT SPECIFIED  Final   Special Requests   Final    BOTTLES DRAWN AEROBIC AND ANAEROBIC Blood Culture adequate volume   Culture  Setup Time   Final    GRAM POSITIVE COCCI AEROBIC BOTTLE ONLY CRITICAL RESULT CALLED TO, READ BACK BY AND VERIFIED WITH: MAYA Damien RAMAN on 012026 @1425  by SM Performed at [facility], [address] [place], [place] [address]    Culture GRAM POSITIVE COCCI  Final   Report Status PENDING  Incomplete  Blood Culture ID Panel (Reflexed)     Status: Abnormal   Collection Time: 09/28/24  7:00 PM  Result Value Ref Range Status   Enterococcus faecalis NOT DETECTED NOT DETECTED Final   Enterococcus Faecium NOT DETECTED NOT DETECTED Final   Listeria monocytogenes NOT DETECTED NOT DETECTED Final   Staphylococcus species DETECTED (A) NOT DETECTED Final    Comment: CRITICAL RESULT CALLED TO, READ BACK BY AND VERIFIED WITH: PHARMD Damien RAMAN on 012026 @1425  by SM     Staphylococcus aureus (BCID) DETECTED (A) NOT DETECTED Final    Comment: Methicillin (oxacillin)-resistant Staphylococcus aureus (MRSA). MRSA is predictably resistant to beta-lactam antibiotics (except ceftaroline). Preferred therapy is vancomycin  unless clinically contraindicated. Patient requires contact precautions if  hospitalized. CRITICAL RESULT CALLED TO, READ BACK BY AND VERIFIED WITH: PHARMD Damien RAMAN on 012026 @1425  by SM    Staphylococcus epidermidis NOT DETECTED NOT DETECTED Final   Staphylococcus lugdunensis NOT DETECTED NOT DETECTED Final   Streptococcus species NOT DETECTED NOT DETECTED Final   Streptococcus agalactiae NOT DETECTED NOT DETECTED Final   Streptococcus pneumoniae NOT DETECTED NOT DETECTED Final   Streptococcus pyogenes NOT DETECTED NOT DETECTED Final   A.calcoaceticus-baumannii NOT DETECTED NOT DETECTED Final   Bacteroides fragilis NOT DETECTED NOT DETECTED Final   Enterobacterales NOT DETECTED NOT DETECTED Final   Enterobacter cloacae complex NOT DETECTED NOT DETECTED Final   Escherichia coli NOT DETECTED NOT DETECTED Final   Klebsiella aerogenes NOT DETECTED NOT DETECTED Final   Klebsiella oxytoca NOT DETECTED NOT DETECTED Final   Klebsiella pneumoniae NOT DETECTED NOT DETECTED Final   Proteus species NOT DETECTED NOT DETECTED Final   Salmonella species NOT DETECTED NOT DETECTED Final   Serratia marcescens NOT DETECTED NOT DETECTED Final   Haemophilus influenzae NOT DETECTED NOT DETECTED Final   Neisseria meningitidis NOT DETECTED NOT DETECTED Final   Pseudomonas aeruginosa NOT DETECTED NOT DETECTED Final   Stenotrophomonas maltophilia NOT DETECTED  NOT DETECTED Final   Candida albicans NOT DETECTED NOT DETECTED Final   Candida auris NOT DETECTED NOT DETECTED Final   Candida glabrata NOT DETECTED NOT DETECTED Final   Candida krusei NOT DETECTED NOT DETECTED Final   Candida parapsilosis NOT DETECTED NOT DETECTED Final   Candida tropicalis NOT DETECTED NOT  DETECTED Final   Cryptococcus neoformans/gattii NOT DETECTED NOT DETECTED Final   Meth resistant mecA/C and MREJ DETECTED (A) NOT DETECTED Final    Comment: CRITICAL RESULT CALLED TO, READ BACK BY AND VERIFIED WITH: PHARMD Damien RAMAN on 012026 @1425  by SM Performed at [facility], [address], [place], [place] [address]   Culture, blood (Routine X 2) w Reflex to ID Panel     Status: None (Preliminary result)   Collection Time: 09/28/24  7:02 PM   Specimen: BLOOD  Result Value Ref Range Status   Specimen Description BLOOD SITE NOT SPECIFIED  Final   Special Requests   Final    BOTTLES DRAWN AEROBIC AND ANAEROBIC Blood Culture adequate volume   Culture   Final    NO GROWTH < 12 HOURS Performed at [facility], [address] [place], [place] [address]    Report Status PENDING  Incomplete  MRSA Next Gen by PCR, Nasal     Status: Abnormal   Collection Time: 09/29/24  7:13 AM   Specimen: Nasal Mucosa; Nasal Swab  Result Value Ref Range Status   MRSA by PCR Next Gen DETECTED (A) NOT DETECTED Final    Comment: RESULT CALLED TO, READ BACK BY AND VERIFIED WITH: RN CHRISTELLA HALEY [id number] AT 1136 BY CM (NOTE) The GeneXpert MRSA Assay (FDA approved for NASAL specimens only), is one component of a comprehensive MRSA colonization surveillance program. It is not intended to diagnose MRSA infection nor to guide or monitor treatment for MRSA infections. Test performance is not FDA approved in patients less than 64 years old. Performed at [facility], [address] [place], [place] [address]     Corean Fireman, MSN, NP-C [facility] Pager: [phone]  09/29/2024 2:41 PM        [1]  Social History Tobacco Use   Smoking status: Former    Current packs/day: 0.00    Average packs/day: 0.2 packs/day for 50.5 years (10.1 ttl pk-yrs)    Types: Cigarettes    Start date: 09/10/1968    Quit date: 03/25/2019    Years  since quitting: 5.5   Smokeless tobacco: Never  Vaping Use   Vaping status: Never Used  Substance Use Topics   Alcohol use: Yes    Comment: on the weekends - moderately per pt   Drug use: Yes    Types: Marijuana    Comment: Friday, Saturday, Sunday  [2] No Known Allergies  "

## 2024-09-29 NOTE — Progress Notes (Signed)
 Patient ID: Catherine Aguilar, female   DOB: [birth year], 69 y.o.   MRN: [id number] [facility] Progress Note   Assessment/ Plan:   1.  End-stage renal disease: From progressive underlying chronic kidney disease secondary to hypertension, type 2 diabetes mellitus and distant history of ANCA vasculitis.  Started on hemodialysis on 09/26/2024 via temporary right IJ dialysis catheter and had her second treatment completed yesterday.  She has dyspnea which in part may be from volume (and for which we will undertake dialysis again today with efforts at volume unloading).  Process underway for evaluation of permanent access/conversion to [facility] at some point. 2.  Dyspnea: With worsening shortness of breath noted overnight prompting imaging that showed mild pulmonary vascular congestion and subsequent CT angiogram that showed no evidence of PE.  Ongoing management with bronchodilator therapy, anxiolytics and efforts at volume unloading. 3.  Hypertension: Blood pressure appears to be currently under decent control with ongoing therapies and UF on HD. 4.  Anemia: Likely secondary to chronic illness, status post ESA as an outpatient on 09/23/2024.  No overt blood loss noted. 5.  Secondary hyperparathyroidism: Calcium  level marginally low when corrected for albumin.  Elevated phosphorus level, will start phosphorus binder.  Subjective:   Reports to be feeling somewhat better and states that she breathes better when propped up.   Objective:   BP 126/89 (BP Location: Right Arm)   Pulse 85   Temp 99 F (37.2 C) (Axillary)   Resp 20   Ht 5' 7 (1.702 m)   Wt 83.2 kg   SpO2 99%   BMI 28.73 kg/m   Intake/Output Summary (Last 24 hours) at 09/29/2024 1112 Last data filed at 09/29/2024 0700 Gross per 24 hour  Intake 467.84 ml  Output 600 ml  Net -132.16 ml   Weight change: 0 kg  Physical Exam: Gen: Appears somewhat comfortable in bed, still dyspneic when speaking CVS: Pulse regular rhythm, normal  rate, S1 and S2 normal Resp: Fine rales right base otherwise distant breath sounds Abd: Soft, obese, nontender, bowel sounds normal Ext: Trace lower extremity edema  Imaging: VAS US  UPPER EXT VEIN MAPPING (PRE-OP  AVF) Result Date: 09/29/2024 UPPER EXTREMITY VEIN MAPPING Patient Name:  Catherine Aguilar  Date of Exam:   09/29/2024 Medical Rec #: [id number]        Accession #:    [id number] Date of Birth: 02-03-1956        Patient Gender: F Patient Age:   53 years Exam Location:  [facility] Procedure:      VAS US  UPPER EXT VEIN MAPPING (PRE-OP  AVF) Referring Phys: GAILE NEW --------------------------------------------------------------------------------  Indications: Pre-access. History: ESRD, volume overload.  Limitations: Limited exam due to patient's uncooperativeness, refused to              continue with exam. Comparison Study: No prior exam. Performing Technologist: Edilia Elden Appl  Examination Guidelines: A complete evaluation includes B-mode imaging, spectral Doppler, color Doppler, and power Doppler as needed of all accessible portions of each vessel. Bilateral testing is considered an integral part of a complete examination. Limited examinations for reoccurring indications may be performed as noted. +-----------------+-------------+----------+--------------+ Right Cephalic   Diameter (cm)Depth (cm)   Findings    +-----------------+-------------+----------+--------------+ Shoulder             0.17        0.80                  +-----------------+-------------+----------+--------------+ Prox upper arm  not visualized +-----------------+-------------+----------+--------------+ Mid upper arm                           not visualized +-----------------+-------------+----------+--------------+ Dist upper arm                          not visualized +-----------------+-------------+----------+--------------+ Antecubital fossa    0.45        0.55       Thrombus    +-----------------+-------------+----------+--------------+ Prox forearm         0.49        0.43      Thrombus    +-----------------+-------------+----------+--------------+ Mid forearm          0.51        0.36                  +-----------------+-------------+----------+--------------+ Dist forearm         0.19        0.11                  +-----------------+-------------+----------+--------------+ Wrist                0.15        0.13                  +-----------------+-------------+----------+--------------+ +-----------------+-------------+----------+---------+ Right Basilic    Diameter (cm)Depth (cm)Findings  +-----------------+-------------+----------+---------+ Mid upper arm        0.53               branching +-----------------+-------------+----------+---------+ Dist upper arm       0.47               branching +-----------------+-------------+----------+---------+ Antecubital fossa    0.35                         +-----------------+-------------+----------+---------+ Prox forearm         0.17                         +-----------------+-------------+----------+---------+ Mid forearm          0.17                         +-----------------+-------------+----------+---------+ Distal forearm       0.19                         +-----------------+-------------+----------+---------+ Elbow                0.29                         +-----------------+-------------+----------+---------+ Wrist                0.16                         +-----------------+-------------+----------+---------+ +-----------------+-------------+----------+--------------+ Left Cephalic    Diameter (cm)Depth (cm)   Findings    +-----------------+-------------+----------+--------------+ Shoulder             0.21        2.00                  +-----------------+-------------+----------+--------------+ Prox upper arm       0.12        0.73                   +-----------------+-------------+----------+--------------+  Mid upper arm                           not visualized +-----------------+-------------+----------+--------------+ Dist upper arm                          not visualized +-----------------+-------------+----------+--------------+ Antecubital fossa    0.69        0.35                  +-----------------+-------------+----------+--------------+ Prox forearm         0.48        0.16                  +-----------------+-------------+----------+--------------+ Mid forearm          0.31        0.20                  +-----------------+-------------+----------+--------------+ Dist forearm         0.27        0.14                  +-----------------+-------------+----------+--------------+ Wrist                0.16        0.16                  +-----------------+-------------+----------+--------------+ +--------------+-------------+----------+--------+ Left Basilic  Diameter (cm)Depth (cm)Findings +--------------+-------------+----------+--------+ Mid upper arm     0.80                        +--------------+-------------+----------+--------+ Dist upper arm    0.46                        +--------------+-------------+----------+--------+ Patient refused to continue the exam, left upper extremity is limited. *See table(s) above for measurements and observations.  Diagnosing physician:    Preliminary    VAS US  LOWER EXTREMITY VENOUS (DVT) Result Date: 09/29/2024  Lower Venous DVT Study Patient Name:  Catherine Aguilar  Date of Exam:   09/29/2024 Medical Rec #: [id number]        Accession #:    [id number] Date of Birth: 1956-08-20        Patient Gender: F Patient Age:   30 years Exam Location:  [facility] Procedure:      VAS US  LOWER EXTREMITY VENOUS (DVT) Referring Phys: LAMAR PATERSON --------------------------------------------------------------------------------  Indications: Edema, and SOB.  Other Indications: Prior CVA. Limitations: Limited exam due to patient's uncooperativeness. Comparison Study: Previous study of the right lower extremity on 7.18.2018. Performing Technologist: Edilia Elden Appl  Examination Guidelines: A complete evaluation includes B-mode imaging, spectral Doppler, color Doppler, and power Doppler as needed of all accessible portions of each vessel. Bilateral testing is considered an integral part of a complete examination. Limited examinations for reoccurring indications may be performed as noted. The reflux portion of the exam is performed with the patient in reverse Trendelenburg.  +---------+---------------+---------+-----------+----------+--------------+ RIGHT    CompressibilityPhasicitySpontaneityPropertiesThrombus Aging +---------+---------------+---------+-----------+----------+--------------+ CFV      Full           No       Yes                                 +---------+---------------+---------+-----------+----------+--------------+ SFJ  Full           No       Yes                                 +---------+---------------+---------+-----------+----------+--------------+ FV Prox  Full                                                        +---------+---------------+---------+-----------+----------+--------------+ FV Mid   Full                                                        +---------+---------------+---------+-----------+----------+--------------+ FV DistalFull                                                        +---------+---------------+---------+-----------+----------+--------------+ PFV      Full                                                        +---------+---------------+---------+-----------+----------+--------------+ POP      Full           No       Yes                                 +---------+---------------+---------+-----------+----------+--------------+ PTV      Full                                                         +---------+---------------+---------+-----------+----------+--------------+ PERO     Full                                                        +---------+---------------+---------+-----------+----------+--------------+   +---------+---------------+---------+-----------+----------+--------------+ LEFT     CompressibilityPhasicitySpontaneityPropertiesThrombus Aging +---------+---------------+---------+-----------+----------+--------------+ CFV      Full           No       Yes                                 +---------+---------------+---------+-----------+----------+--------------+ SFJ      Full           No       Yes                                 +---------+---------------+---------+-----------+----------+--------------+  FV Prox  Full                                                        +---------+---------------+---------+-----------+----------+--------------+ FV Mid   Full                                                        +---------+---------------+---------+-----------+----------+--------------+ FV DistalFull                                                        +---------+---------------+---------+-----------+----------+--------------+ PFV      Full                                                        +---------+---------------+---------+-----------+----------+--------------+ POP      Full           No       Yes                                 +---------+---------------+---------+-----------+----------+--------------+ PTV      Full                                                        +---------+---------------+---------+-----------+----------+--------------+ PERO     Full                                                        +---------+---------------+---------+-----------+----------+--------------+ SSV      Full           No       Yes                  Chronic         +---------+---------------+---------+-----------+----------+--------------+ Chronic calcified thrombus noted in the left small saphenous vein.    Summary: RIGHT: - There is no evidence of deep vein thrombosis in the lower extremity.  - No cystic structure found in the popliteal fossa.  LEFT: - Findings consistent with chronic superficial vein thrombosis involving the left small saphenous vein.  - No cystic structure found in the popliteal fossa.  *See table(s) above for measurements and observations.    Preliminary    CT Angio Chest Pulmonary Embolism (PE) W or WO Contrast Result Date: 09/28/2024 EXAM: CTA CHEST 09/28/2024 06:48:36 PM TECHNIQUE: CTA of the chest was performed after the administration of intravenous contrast. Multiplanar reformatted images are provided for review. MIP images are provided for  review. Automated exposure control, iterative reconstruction, and/or weight based adjustment of the mA/kV was utilized to reduce the radiation dose to as low as reasonably achievable. COMPARISON: Chest CT 07/09/2024. CLINICAL HISTORY: Pulmonary embolism (PE) suspected, low to intermediate prob, positive D-dimer. FINDINGS: PULMONARY ARTERIES: Pulmonary arteries are adequately opacified for evaluation. No acute pulmonary embolus. Main pulmonary artery is normal in caliber. MEDIASTINUM: The heart is enlarged. Coronary atherosclerotic calcifications are noted. There is a stable small pericardial effusion. Aortic atherosclerotic calcifications are noted. There is a 6 mm hypodense isthmus nodule. No aortic dissection, intramural hematoma, aortic ulceration, or rupture. LYMPH NODES: No mediastinal, hilar or axillary lymphadenopathy. LUNGS AND PLEURA: Mild emphysema present. Airspace consolidation in the left lower lobe inferiorly and also minimally within the lingula. Additional tree in bud opacities in the inferior left upper lobe. Secretions in the left lower lobe bronchus. No evidence of pleural effusion or  pneumothorax. UPPER ABDOMEN: Cysts in the kidneys. SOFT TISSUES AND BONES: Healed right sided 7th and 8th rib fractures. No acute soft tissue abnormality. IMPRESSION: 1. No evidence of pulmonary embolism. 2. Airspace consolidation in the left lower lobe and inferior left upper lobe compatible with multifocal pneumonia. There are secretions in the left lower lobe bronchus concerning for aspiration. 3. Mild emphysema; pulmonary emphysema is an independent risk factor for lung cancer, recommend consideration for evaluation for a low-dose CT lung cancer screening program. Electronically signed by: Greig Pique MD 09/28/2024 07:03 PM EST RP Workstation: [id number]   DG Chest Port 1 View Result Date: 09/28/2024 CLINICAL DATA:  Acute respiratory distress. EXAM: PORTABLE CHEST 1 VIEW COMPARISON:  September 25, 2024 FINDINGS: Since the prior study there is been interval placement of a right-sided venous catheter with its distal tip noted at the junction of the superior vena cava and right atrium. The cardiac silhouette is enlarged and unchanged in size. There is prominence of the central pulmonary vasculature. Mild diffusely increased interstitial lung markings are also noted. Mild to moderate severity atelectasis and/or infiltrate is suspected within the retrocardiac region of the left lung base. A small left pleural effusion is also noted. No pneumothorax is identified. Chronic right rib fractures are present. IMPRESSION: 1. Cardiomegaly with mild pulmonary vascular congestion and mild interstitial edema. 2. Mild to moderate severity left basilar atelectasis and/or infiltrate. 3. Small left pleural effusion. Electronically Signed   By: Suzen Dials M.D.   On: 09/28/2024 14:19    Labs: BMET Recent Labs  Lab 09/23/24 1312 09/23/24 1335 09/25/24 2134 09/26/24 0343 09/27/24 0401 09/28/24 0124 09/28/24 1323  NA 140 139 138 136 135 133* 133*  K 4.1 4.0 4.0 4.4 3.1* 3.6 3.5  CL 98 99 97* 96* 95* 92* 93*   CO2 18*  --  19* 16* 20* 22 24  GLUCOSE 98 95 124* 176* 112* 105* 112*  BUN 145* >130* 145* 145* 110* 117* 69*  CREATININE 14.90* 16.00* 14.80* 14.40* 10.40* 11.30* 7.34*  CALCIUM  7.3*  --  7.7* 7.4* 8.4* 7.5* 8.1*  PHOS  --   --   --   --  5.8* 7.4*  --    CBC Recent Labs  Lab 09/23/24 1312 09/23/24 1335 09/25/24 2134 09/26/24 0343 09/27/24 0401 09/28/24 0124  WBC 8.6  --  12.0* 14.7* 13.6* 16.1*  NEUTROABS 6.4  --  9.3*  --   --   --   HGB 6.4*   < > 8.0* 7.3* 7.3* 7.3*  HCT 20.4*   < > 24.8* 22.8* 22.0* 22.8*  MCV 81.3  --  80.8 81.7 78.3* 82.6  PLT 143*  --  151 133* 164 170   < > = values in this interval not displayed.    Medications:     amLODipine   10 mg Oral Daily   budesonide -glycopyrrolate -formoterol   2 puff Inhalation Daily   carvedilol   12.5 mg Oral BID WC   Chlorhexidine  Gluconate Cloth  6 each Topical Daily   doxazosin   2 mg Oral Daily   heparin   5,000 Units Subcutaneous Q8H   hydrALAZINE   100 mg Oral Q8H   insulin  aspart  0-15 Units Subcutaneous TID WC   ipratropium-albuterol   3 mL Nebulization Q4H   pravastatin   40 mg Oral QHS   sodium chloride  flush  10-40 mL Intracatheter Q12H   vancomycin  variable dose per unstable renal function (pharmacist dosing)   Does not apply See admin instructions   Gordy Blanch, MD 09/29/2024, 11:12 AM

## 2024-09-29 NOTE — Plan of Care (Signed)
 Spoke with Dr. Roark on-call ENT who will evaluate the patient

## 2024-09-29 NOTE — Progress Notes (Signed)
 Bilateral lower extremity venous duplex and a bilateral upper extremity vein mapping has been completed.  Results can be found in chart review under CV Proc.  09/29/2024 11:05 AM  Vitoria Conyer Elden Appl, RVT.

## 2024-09-29 NOTE — Progress Notes (Signed)
 I was notified by nursing staff regarding [name]'s worsening respiratory status. Upon arrival, pt is anxious, sitting upright in a tripod position with labored breathing and audible inspiratory wheezes. RLL crackles. Sats 100% on 4L [place]. Double Duoneb given followed by Racemic epi. FPTS at bedside.  PCCM notified by FPTS and PCCM recommended racemic epi and Ativan  1 mg IV. May need additional anxiolytics if Ativan  is ineffective.  Pt is now less labored and no longer wheezing audibly.   0400-99.57F, HR 90, 167/86(111), RR 22 with sats 100% on 4L [place]

## 2024-09-29 NOTE — Progress Notes (Signed)
 PHARMACY - PHYSICIAN COMMUNICATION CRITICAL VALUE ALERT - BLOOD CULTURE IDENTIFICATION (BCID)  Catherine Aguilar is an 69 y.o. female who presented to [facility] on 09/25/2024 with a chief complaint of shortness of breath,   Assessment:  69 year old female admitted with progressing CKD/ESRD starting HD this admission. An HD catheter was placed 1/17. Blood cultures with 1/19 now with 1/4 MRSA.   Name of physician (or Provider) Contacted: ID team, FMTS   Current antibiotics: Vanc/Zosyn   Changes to prescribed antibiotics recommended:  D/C Zosyn   Continue Vanc for MRSA bacteremia   Results for orders placed or performed during the hospital encounter of 09/25/24  Blood Culture ID Panel (Reflexed) (Collected: 09/28/2024  7:00 PM)  Result Value Ref Range   Enterococcus faecalis NOT DETECTED NOT DETECTED   Enterococcus Faecium NOT DETECTED NOT DETECTED   Listeria monocytogenes NOT DETECTED NOT DETECTED   Staphylococcus species DETECTED (A) NOT DETECTED   Staphylococcus aureus (BCID) DETECTED (A) NOT DETECTED   Staphylococcus epidermidis NOT DETECTED NOT DETECTED   Staphylococcus lugdunensis NOT DETECTED NOT DETECTED   Streptococcus species NOT DETECTED NOT DETECTED   Streptococcus agalactiae NOT DETECTED NOT DETECTED   Streptococcus pneumoniae NOT DETECTED NOT DETECTED   Streptococcus pyogenes NOT DETECTED NOT DETECTED   A.calcoaceticus-baumannii NOT DETECTED NOT DETECTED   Bacteroides fragilis NOT DETECTED NOT DETECTED   Enterobacterales NOT DETECTED NOT DETECTED   Enterobacter cloacae complex NOT DETECTED NOT DETECTED   Escherichia coli NOT DETECTED NOT DETECTED   Klebsiella aerogenes NOT DETECTED NOT DETECTED   Klebsiella oxytoca NOT DETECTED NOT DETECTED   Klebsiella pneumoniae NOT DETECTED NOT DETECTED   Proteus species NOT DETECTED NOT DETECTED   Salmonella species NOT DETECTED NOT DETECTED   Serratia marcescens NOT DETECTED NOT DETECTED   Haemophilus influenzae NOT DETECTED NOT  DETECTED   Neisseria meningitidis NOT DETECTED NOT DETECTED   Pseudomonas aeruginosa NOT DETECTED NOT DETECTED   Stenotrophomonas maltophilia NOT DETECTED NOT DETECTED   Candida albicans NOT DETECTED NOT DETECTED   Candida auris NOT DETECTED NOT DETECTED   Candida glabrata NOT DETECTED NOT DETECTED   Candida krusei NOT DETECTED NOT DETECTED   Candida parapsilosis NOT DETECTED NOT DETECTED   Candida tropicalis NOT DETECTED NOT DETECTED   Cryptococcus neoformans/gattii NOT DETECTED NOT DETECTED   Meth resistant mecA/C and MREJ DETECTED (A) NOT DETECTED    Damien Quiet, PharmD, BCIDP Infectious Diseases Clinical Pharmacist Phone: [phone] 09/29/2024  2:34 PM

## 2024-09-29 NOTE — Progress Notes (Signed)
 Patient arrived from HD, she is drowsy but alert and following commands, V/S obtained, continue under tele sitter safety monitoring, refused to eat dinner, all needs met, call bell in reach.   09/29/24 1833  Vitals  Temp 100.3 F (37.9 C)  Temp Source Oral  BP (!) 140/58  MAP (mmHg) 78  BP Location Left Arm  BP Method Automatic  Patient Position (if appropriate) Lying  Pulse Rate 83  Pulse Rate Source Monitor  ECG Heart Rate 97  Resp 18  Level of Consciousness  Level of Consciousness Alert  MEWS COLOR  MEWS Score Color Green  Oxygen Therapy  SpO2 100 %  O2 Device Nasal Cannula  O2 Flow Rate (L/min) 2 L/min  MEWS Score  MEWS Temp 0  MEWS Systolic 0  MEWS Pulse 0  MEWS RR 0  MEWS LOC 0  MEWS Score 0

## 2024-09-29 NOTE — Progress Notes (Signed)
 SLP Cancellation Note  Patient Details Name: Catherine Aguilar MRN: [id number] DOB: 02/01/1956   Cancelled treatment:       Reason Eval/Treat Not Completed: Patient at procedure or test/unavailable. Leaving for HD when SLP arrived. Will continue efforts.    Dustin Olam Bull 09/29/2024, 2:26 PM

## 2024-09-29 NOTE — Assessment & Plan Note (Addendum)
 T2DM: hold nighttime Lantus  on admission, moderate SSI HLD: continue home Pravastatin  40 mg daily CAD: Holding ASA in the setting of anemia requiring recurrent transfusions per cardiology

## 2024-09-29 NOTE — Assessment & Plan Note (Addendum)
 2.08 on admission. DVT US  showed chronic superficial vein thrombosis involving the L small saphenous vein.  - Heparin  resumed given low suspicion for bleed - Vascular surgery following, will f/u recommendations

## 2024-09-29 NOTE — Progress Notes (Incomplete)
 Pharmacy Antibiotic Note  Catherine Aguilar is a 69 y.o. female admitted on 09/25/2024 with leukocytosis and fever. New HD catheter placed 1/17.  Pharmacy has been consulted for Vancomycin  dosing.   1st dose Vancomycin  1,750 mg 1/19 post-HD.   Plan: STOP Zosyn  Augmentin  250 mg-125 Q12H for Asp PNA  Vancomycin  750 mg x 1 with HD  Will f/u HD schedule/tolerance for further vancomycin  dosing Will f/u micro data and pt's clinical condition   Height: 5' 7 (170.2 cm) Weight: (S) 80.7 kg (177 lb 14.6 oz) (Weightr Bed) IBW/kg (Calculated) : 61.6  Temp (24hrs), Avg:98.9 F (37.2 C), Min:98.2 F (36.8 C), Max:100.1 F (37.8 C)  Recent Labs  Lab 09/25/24 2134 09/26/24 0343 09/27/24 0401 09/28/24 0124 09/28/24 1323 09/29/24 1107  WBC 12.0* 14.7* 13.6* 16.1*  --  36.8*  CREATININE 14.80* 14.40* 10.40* 11.30* 7.34* 8.79*    Estimated Creatinine Clearance: 6.6 mL/min (A) (by C-G formula based on SCr of 8.79 mg/dL (H)).    Allergies[1]  Antimicrobials this admission: 1/19 Zosyn  >>  1/19 Vanc >>   Microbiology results: Pending  Thank you for allowing pharmacy to be a part of this patients care.  Vito Ralph, PharmD, BCPS Please see amion for complete clinical pharmacist phone list 09/29/2024 3:14 PM      [1] No Known Allergies

## 2024-09-29 NOTE — Assessment & Plan Note (Addendum)
 Suspect this may be playing a component in patient's increased WOB resulting in rapid response x2. Patient with hx of Wegener's granulomatosis of which this can be a manifestation. - ENT consulted, appreciate recommendations - Hydroxyzine  10mg  TID PRN

## 2024-09-29 NOTE — Plan of Care (Signed)
 FMTS Interim Progress Note  S: Catherine Aguilar is a 69 YO Female w/ PMHx of DMII, HTN, CKD stage V, anemia of chronic disease, COPD, Wegener's granulomatosis w/ renal involvement. She has been admitted for CHF exacerbation. Pt recently started iHD yesterday and had a second iHD session today. During the iHD she become Tachyapnea. CXR show no evidence of pulmonary edema. ABG shows respiratory alkalosis. RRT was called. Per critical care note concerned for Dialysis disequilibrium syndrome (DDS) possible too-rapid correction of uremia.    O: BP (!) 159/97   Pulse 93   Temp 98.5 F (36.9 C) (Oral)   Resp 17   Ht 5' 7 (1.702 m)   Wt 82 kg   SpO2 100%   BMI 28.31 kg/m    Physical Exam Pulmonary:     Breath sounds: Examination of the right-upper field reveals wheezing. Examination of the left-upper field reveals wheezing. Examination of the right-lower field reveals decreased breath sounds. Examination of the left-lower field reveals decreased breath sounds. Decreased breath sounds and wheezing present.  Psychiatric:        Mood and Affect: Mood is anxious.        Behavior: Behavior is agitated.     A/P: Present at bedside to check on patient. Pt remained SOB w/ WOB. When I asked her how she is doing she states  not well and yelled out  I can't breath . Discussed w/ bedside RN, Rock and RT who both c/o her respiratory status. ABG was placed to ensure that her respiratory status was not worsening. Additional 25 mg Atarax  was also given to help with her anxiety.   Attempted to reach out to critical care provider oncall asking for additional recommendations. [facility] ICU APP called back and recommended repeat CXR.    Catherine Houston NOVAK, DO 09/29/2024, 2:02 AM PGY-1, [facility] pager [phone]

## 2024-09-29 NOTE — Assessment & Plan Note (Signed)
" >>  ASSESSMENT AND PLAN FOR SUSPECTED VOCAL CORD DYSFUNCTION WRITTEN ON 09/29/2024  2:38 PM BY Mikael Skoda, DO  Suspect this may be playing a component in patient's increased WOB resulting in rapid response x2. Patient with hx of Wegener's granulomatosis of which this can be a manifestation. - ENT consulted, appreciate recommendations - Hydroxyzine  10mg  TID PRN "

## 2024-09-29 NOTE — Consult Note (Signed)
 Reason for Consult:vocal cord dysfunction Referring Physician: Dr Donzetta Mont Catherine Aguilar is an 69 y.o. female.  HPI: hx of end stage renal disease and vasculitis. She fluctuates with some dyspnea. She has pneumonia and a blood culture with MRSA. It was suspected she had vocal cord dysfunction and ENT consult. Right now she is apparent very sedated.   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   CVA (cerebral vascular accident) (HCC) 07/03/2012   Diffuse pulmonary alveolar hemorrhage    Edema of right lower extremity 03/25/2017   Elevated d-dimer 09/26/2024   Essential hypertension 12/08/2016   Hemoptysis 12/08/2016   High cholesterol    HLD (hyperlipidemia) 12/08/2016   Hx of adenomatous polyp of colon 04/16/2017   Hypertension    stopped taking medications 10 years ago   Hypokalemia 07/02/2012   Intra-alveolar hemorrhage 12/08/2016   Left facial numbness 07/02/2012   Sepsis (HCC) 12/08/2016   Shortness of breath 12/02/2017   Stroke (HCC)    Substance abuse (HCC) 12/08/2016   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   Tobacco abuse 12/08/2016   Type 2 diabetes mellitus without complication, with long-term current use of insulin  (HCC) 02/11/2017   Wegener's granulomatosis with renal involvement (HCC) 12/08/2016    Past Surgical History:  Procedure Laterality Date   ABDOMINAL HYSTERECTOMY  1985   IR TUNNELED CENTRAL VENOUS CATH PLC W IMG  09/26/2024    Family History  Problem Relation Age of Onset   Heart attack Mother    Diabetes Mother    Hypertension Mother    Stroke Mother     Cancer Father    Alcohol abuse Father    Diabetes Father    Hypertension Father    Breast cancer Neg Hx    Colon cancer Neg Hx    Esophageal cancer Neg Hx    Rectal cancer Neg Hx    Stomach cancer Neg Hx    Pancreatic cancer Neg Hx    BRCA 1/2 Neg Hx     Social History:  reports that she quit smoking about 5 years ago. Her smoking use included cigarettes. She started smoking about 56 years ago. She has a 10.1 pack-year smoking history. She has never used smokeless tobacco. She reports current alcohol use. She reports current drug use. Drug: Marijuana.  Allergies: Allergies[1]   Results for orders placed or performed during the hospital encounter of 09/25/24 (from the past 48 hours)  CBC     Status: Abnormal   Collection Time: 09/28/24  1:24 AM  Result Value Ref Range   WBC 16.1 (H) 4.0 - 10.5 K/uL   RBC 2.76 (L) 3.87 - 5.11 MIL/uL   Hemoglobin 7.3 (L) 12.0 - 15.0 g/dL   HCT 77.1 (L) 63.9 - 53.9 %   MCV 82.6 80.0 - 100.0 fL   MCH 26.4 26.0 - 34.0 pg   MCHC 32.0 30.0 - 36.0 g/dL   RDW 82.9 (H) 88.4 - 84.4 %   Platelets 170 150 - 400 K/uL   nRBC 0.6 (H) 0.0 - 0.2 %    Comment: Performed at [facility]  Lab, [address] [place], [place] [address]  Renal function panel     Status: Abnormal   Collection Time: 09/28/24  1:24 AM  Result Value Ref Range   Sodium 133 (L) 135 - 145 mmol/L   Potassium 3.6 3.5 - 5.1 mmol/L   Chloride 92 (L) 98 - 111 mmol/L   CO2 22 22 - 32 mmol/L   Glucose, Bld 105 (H) 70 - 99 mg/dL    Comment: Glucose reference range applies only to samples taken after fasting for at least 8 hours.   BUN 117 (H) 8 - 23 mg/dL   Creatinine, Ser 88.69 (H) 0.44 - 1.00 mg/dL   Calcium  7.5 (L) 8.9 - 10.3 mg/dL   Phosphorus 7.4 (H) 2.5 - 4.6 mg/dL   Albumin 3.9 3.5 - 5.0 g/dL   GFR, Estimated 3 (L) >60 mL/min    Comment: (NOTE) Calculated using the CKD-EPI Creatinine Equation (2021)    Anion gap 19 (H) 5 - 15    Comment: Performed at [facility]  Lab, [address], [place], [place] [address]  Glucose, capillary     Status: Abnormal   Collection Time: 09/28/24  8:33 AM  Result Value Ref Range   Glucose-Capillary 125 (H) 70 - 99 mg/dL    Comment: Glucose reference range applies only to samples taken after fasting for at least 8 hours.  Glucose, capillary     Status: Abnormal   Collection Time: 09/28/24 12:09 PM  Result Value Ref Range   Glucose-Capillary 112 (H) 70 - 99 mg/dL    Comment: Glucose reference range applies only to samples taken after fasting for at least 8 hours.  Troponin T, High Sensitivity     Status: Abnormal   Collection Time: 09/28/24  1:23 PM  Result Value Ref Range   Troponin T High Sensitivity 316 (HH) 0 - 19 ng/L    Comment: Critical value noted. Value is consistent with previously reported and called value  (NOTE) Biotin concentrations > 1000 ng/mL falsely decrease TnT results.  Serial cardiac troponin measurements are suggested.  Refer to the Links section for chest pain algorithms and additional  guidance. Performed at [facility], [address] [place], [place] [address]   Basic metabolic panel     Status: Abnormal   Collection Time: 09/28/24  1:23 PM  Result Value Ref Range   Sodium 133 (L) 135 - 145 mmol/L   Potassium 3.5 3.5 - 5.1 mmol/L   Chloride 93 (L) 98 - 111 mmol/L   CO2 24 22 - 32 mmol/L   Glucose, Bld 112 (H) 70 - 99 mg/dL    Comment: Glucose reference range applies only to samples taken after fasting for at least 8 hours.   BUN 69 (H) 8 - 23 mg/dL   Creatinine, Ser 2.65 (H) 0.44 - 1.00 mg/dL   Calcium  8.1 (L) 8.9 - 10.3 mg/dL   GFR, Estimated 6 (L) >60 mL/min    Comment: (NOTE) Calculated using the CKD-EPI Creatinine Equation (2021)    Anion gap 16 (H) 5 - 15    Comment: Performed at [facility], [address], [place], [place] [address]  Blood gas, venous     Status: Abnormal   Collection Time: 09/28/24  1:26 PM  Result Value Ref Range   pH, Ven 7.49 (H) 7.25 -  7.43   pCO2, Ven 34 (L) 44 - 60 mmHg   pO2, Ven 40 32 - 45 mmHg   Bicarbonate  26.1 20.0 - 28.0 mmol/L   Acid-Base Excess 2.8 (H) 0.0 - 2.0 mmol/L   O2 Saturation 75.4 %   Patient temperature 36.7    Drawn by [id number]     Comment: Performed at [facility], [address] [place], [place] [address]  Respiratory (~20 pathogens) panel by PCR     Status: None   Collection Time: 09/28/24  3:40 PM   Specimen: Nasopharyngeal Swab; Respiratory  Result Value Ref Range   Adenovirus NOT DETECTED NOT DETECTED   Coronavirus 229E NOT DETECTED NOT DETECTED    Comment: (NOTE) The Coronavirus on the Respiratory Panel, DOES NOT test for the novel  Coronavirus (2019 nCoV)    Coronavirus HKU1 NOT DETECTED NOT DETECTED   Coronavirus NL63 NOT DETECTED NOT DETECTED   Coronavirus OC43 NOT DETECTED NOT DETECTED   Metapneumovirus NOT DETECTED NOT DETECTED   Rhinovirus / Enterovirus NOT DETECTED NOT DETECTED   Influenza A NOT DETECTED NOT DETECTED   Influenza B NOT DETECTED NOT DETECTED   Parainfluenza Virus 1 NOT DETECTED NOT DETECTED   Parainfluenza Virus 2 NOT DETECTED NOT DETECTED   Parainfluenza Virus 3 NOT DETECTED NOT DETECTED   Parainfluenza Virus 4 NOT DETECTED NOT DETECTED   Respiratory Syncytial Virus NOT DETECTED NOT DETECTED   Bordetella pertussis NOT DETECTED NOT DETECTED   Bordetella Parapertussis NOT DETECTED NOT DETECTED   Chlamydophila pneumoniae NOT DETECTED NOT DETECTED   Mycoplasma pneumoniae NOT DETECTED NOT DETECTED    Comment: Performed at [facility] Lab, [address], [place], [place] [address]  Glucose, capillary     Status: Abnormal   Collection Time: 09/28/24  6:18 PM  Result Value Ref Range   Glucose-Capillary 172 (H) 70 - 99 mg/dL    Comment: Glucose reference range applies only to samples taken after fasting for at least 8 hours.  Troponin T, High Sensitivity     Status: Abnormal   Collection Time: 09/28/24  7:00 PM  Result Value Ref Range   Troponin T High  Sensitivity 290 (HH) 0 - 19 ng/L    Comment: Critical value noted. Value is consistent with previously reported and called value  (NOTE) Biotin concentrations > 1000 ng/mL falsely decrease TnT results.  Serial cardiac troponin measurements are suggested.  Refer to the Links section for chest pain algorithms and additional  guidance. Performed at [facility], [address] [place], [place] [address]   Culture, blood (Routine X 2) w Reflex to ID Panel     Status: None (Preliminary result)   Collection Time: 09/28/24  7:00 PM   Specimen: BLOOD  Result Value Ref Range   Specimen Description BLOOD SITE NOT SPECIFIED    Special Requests      BOTTLES DRAWN AEROBIC AND ANAEROBIC Blood Culture adequate volume   Culture  Setup Time      GRAM POSITIVE COCCI AEROBIC BOTTLE ONLY CRITICAL RESULT CALLED TO, READ BACK BY AND VERIFIED WITH: PHARMD Damien RAMAN on 012026 @1425  by SM Performed at [facility] Lab, [address], [place], [place] [address]    Culture GRAM POSITIVE COCCI    Report Status PENDING   Blood Culture ID Panel (Reflexed)     Status: Abnormal   Collection Time: 09/28/24  7:00 PM  Result Value Ref Range   Enterococcus faecalis NOT DETECTED NOT DETECTED   Enterococcus Faecium NOT DETECTED NOT DETECTED   Listeria monocytogenes NOT DETECTED NOT DETECTED   Staphylococcus species DETECTED (A) NOT DETECTED  Comment: CRITICAL RESULT CALLED TO, READ BACK BY AND VERIFIED WITH: PHARMD Damien RAMAN on 012026 @1425  by SM    Staphylococcus aureus (BCID) DETECTED (A) NOT DETECTED    Comment: Methicillin (oxacillin)-resistant Staphylococcus aureus (MRSA). MRSA is predictably resistant to beta-lactam antibiotics (except ceftaroline). Preferred therapy is vancomycin  unless clinically contraindicated. Patient requires contact precautions if  hospitalized. CRITICAL RESULT CALLED TO, READ BACK BY AND VERIFIED WITH: PHARMD Damien RAMAN on 012026 @1425  by SM    Staphylococcus epidermidis NOT  DETECTED NOT DETECTED   Staphylococcus lugdunensis NOT DETECTED NOT DETECTED   Streptococcus species NOT DETECTED NOT DETECTED   Streptococcus agalactiae NOT DETECTED NOT DETECTED   Streptococcus pneumoniae NOT DETECTED NOT DETECTED   Streptococcus pyogenes NOT DETECTED NOT DETECTED   A.calcoaceticus-baumannii NOT DETECTED NOT DETECTED   Bacteroides fragilis NOT DETECTED NOT DETECTED   Enterobacterales NOT DETECTED NOT DETECTED   Enterobacter cloacae complex NOT DETECTED NOT DETECTED   Escherichia coli NOT DETECTED NOT DETECTED   Klebsiella aerogenes NOT DETECTED NOT DETECTED   Klebsiella oxytoca NOT DETECTED NOT DETECTED   Klebsiella pneumoniae NOT DETECTED NOT DETECTED   Proteus species NOT DETECTED NOT DETECTED   Salmonella species NOT DETECTED NOT DETECTED   Serratia marcescens NOT DETECTED NOT DETECTED   Haemophilus influenzae NOT DETECTED NOT DETECTED   Neisseria meningitidis NOT DETECTED NOT DETECTED   Pseudomonas aeruginosa NOT DETECTED NOT DETECTED   Stenotrophomonas maltophilia NOT DETECTED NOT DETECTED   Candida albicans NOT DETECTED NOT DETECTED   Candida auris NOT DETECTED NOT DETECTED   Candida glabrata NOT DETECTED NOT DETECTED   Candida krusei NOT DETECTED NOT DETECTED   Candida parapsilosis NOT DETECTED NOT DETECTED   Candida tropicalis NOT DETECTED NOT DETECTED   Cryptococcus neoformans/gattii NOT DETECTED NOT DETECTED   Meth resistant mecA/C and MREJ DETECTED (A) NOT DETECTED    Comment: CRITICAL RESULT CALLED TO, READ BACK BY AND VERIFIED WITH: PHARMD Damien RAMAN on 012026 @1425  by SM Performed at [facility], [address], [place], [place] [address]   Culture, blood (Routine X 2) w Reflex to ID Panel     Status: None (Preliminary result)   Collection Time: 09/28/24  7:02 PM   Specimen: BLOOD  Result Value Ref Range   Specimen Description BLOOD SITE NOT SPECIFIED    Special Requests      BOTTLES DRAWN AEROBIC AND ANAEROBIC Blood Culture adequate volume    Culture  Setup Time      GRAM POSITIVE COCCI IN CLUSTERS ANAEROBIC BOTTLE ONLY CRITICAL VALUE NOTED.  VALUE IS CONSISTENT WITH PREVIOUSLY REPORTED AND CALLED VALUE. Performed at [facility], [address], [place], [place] [address]    Culture GRAM POSITIVE COCCI    Report Status PENDING   Glucose, capillary     Status: Abnormal   Collection Time: 09/28/24  9:09 PM  Result Value Ref Range   Glucose-Capillary 182 (H) 70 - 99 mg/dL    Comment: Glucose reference range applies only to samples taken after fasting for at least 8 hours.  Blood gas, arterial     Status: None   Collection Time: 09/28/24 11:40 PM  Result Value Ref Range   pH, Arterial 7.45 7.35 - 7.45   pCO2 arterial 34 32 - 48 mmHg   pO2, Arterial 92 83 - 108 mmHg   Bicarbonate 23.6 20.0 - 28.0 mmol/L   Acid-Base Excess 0.1 0.0 - 2.0 mmol/L   O2 Saturation 98.3 %   Patient temperature 36.9  Collection site RIGHT RADIAL    Drawn by [id number]    Allens test (pass/fail) PASS PASS    Comment: Performed at [facility], [address] [place], [place] [address]  Urinalysis, Routine w reflex microscopic -Urine, Clean Catch     Status: Abnormal   Collection Time: 09/29/24  1:35 AM  Result Value Ref Range   Color, Urine YELLOW YELLOW   APPearance HAZY (A) CLEAR   Specific Gravity, Urine 1.010 1.005 - 1.030   pH 6.0 5.0 - 8.0   Glucose, UA NEGATIVE NEGATIVE mg/dL   Hgb urine dipstick SMALL (A) NEGATIVE   Bilirubin Urine NEGATIVE NEGATIVE   Ketones, ur NEGATIVE NEGATIVE mg/dL   Protein, ur >=699 (A) NEGATIVE mg/dL   Nitrite NEGATIVE NEGATIVE   Leukocytes,Ua LARGE (A) NEGATIVE   RBC / HPF 6-10 0 - 5 RBC/hpf   WBC, UA 11-20 0 - 5 WBC/hpf   Bacteria, UA RARE (A) NONE SEEN   Squamous Epithelial / HPF 6-10 0 - 5 /HPF    Comment: Performed at [facility], [address] [place], [place] [address]  Glucose, capillary     Status: Abnormal   Collection Time: 09/29/24  6:16 AM  Result Value Ref Range    Glucose-Capillary 125 (H) 70 - 99 mg/dL    Comment: Glucose reference range applies only to samples taken after fasting for at least 8 hours.   Comment 1 Notify RN    Comment 2 Document in Chart   MRSA Next Gen by PCR, Nasal     Status: Abnormal   Collection Time: 09/29/24  7:13 AM   Specimen: Nasal Mucosa; Nasal Swab  Result Value Ref Range   MRSA by PCR Next Gen DETECTED (A) NOT DETECTED    Comment: RESULT CALLED TO, READ BACK BY AND VERIFIED WITH: RN CHRISTELLA HALEY [id number] AT 1136 BY CM (NOTE) The GeneXpert MRSA Assay (FDA approved for NASAL specimens only), is one component of a comprehensive MRSA colonization surveillance program. It is not intended to diagnose MRSA infection nor to guide or monitor treatment for MRSA infections. Test performance is not FDA approved in patients less than 64 years old. Performed at [facility], [address], [place], [place] [address]   Glucose, capillary     Status: Abnormal   Collection Time: 09/29/24  7:55 AM  Result Value Ref Range   Glucose-Capillary 115 (H) 70 - 99 mg/dL    Comment: Glucose reference range applies only to samples taken after fasting for at least 8 hours.  Renal function panel     Status: Abnormal   Collection Time: 09/29/24 11:07 AM  Result Value Ref Range   Sodium 131 (L) 135 - 145 mmol/L   Potassium 4.0 3.5 - 5.1 mmol/L   Chloride 90 (L) 98 - 111 mmol/L   CO2 21 (L) 22 - 32 mmol/L   Glucose, Bld 105 (H) 70 - 99 mg/dL    Comment: Glucose reference range applies only to samples taken after fasting for at least 8 hours.   BUN 85 (H) 8 - 23 mg/dL   Creatinine, Ser 1.20 (H) 0.44 - 1.00 mg/dL   Calcium  8.1 (L) 8.9 - 10.3 mg/dL   Phosphorus 7.2 (H) 2.5 - 4.6 mg/dL   Albumin 3.8 3.5 - 5.0 g/dL   GFR, Estimated 4 (L) >60 mL/min    Comment: (NOTE) Calculated using the CKD-EPI Creatinine Equation (2021)    Anion gap 20 (H) 5 - 15  Comment: Performed at [facility], [address], [place], [place] [address]   CBC     Status: Abnormal   Collection Time: 09/29/24 11:07 AM  Result Value Ref Range   WBC 36.8 (H) 4.0 - 10.5 K/uL   RBC 2.89 (L) 3.87 - 5.11 MIL/uL   Hemoglobin 7.6 (L) 12.0 - 15.0 g/dL   HCT 76.6 (L) 63.9 - 53.9 %   MCV 80.6 80.0 - 100.0 fL   MCH 26.3 26.0 - 34.0 pg   MCHC 32.6 30.0 - 36.0 g/dL   RDW 82.4 (H) 88.4 - 84.4 %   Platelets 166 150 - 400 K/uL   nRBC 0.1 0.0 - 0.2 %    Comment: Performed at [facility], [address] [place], [place] [address]  Glucose, capillary     Status: Abnormal   Collection Time: 09/29/24 12:04 PM  Result Value Ref Range   Glucose-Capillary 105 (H) 70 - 99 mg/dL    Comment: Glucose reference range applies only to samples taken after fasting for at least 8 hours.    VAS US  LOWER EXTREMITY VENOUS (DVT) Result Date: 09/29/2024  Lower Venous DVT Study Patient Name:  Catherine Aguilar  Date of Exam:   09/29/2024 Medical Rec #: [id number]        Accession #:    [id number] Date of Birth: [birth year]        Patient Gender: F Patient Age:   69 years Exam Location:  [facility] Procedure:      VAS US  LOWER EXTREMITY VENOUS (DVT) Referring Phys: LAMAR PATERSON --------------------------------------------------------------------------------  Indications: Edema, and SOB. Other Indications: Prior CVA. Limitations: Limited exam due to patient's uncooperativeness. Comparison Study: Previous study of the right lower extremity on 7.18.2018. Performing Technologist: Edilia Elden Appl  Examination Guidelines: A complete evaluation includes B-mode imaging, spectral Doppler, color Doppler, and power Doppler as needed of all accessible portions of each vessel. Bilateral testing is considered an integral part of a complete examination. Limited examinations for reoccurring indications may be performed as noted. The reflux portion of the exam is performed with the patient in reverse Trendelenburg.   +---------+---------------+---------+-----------+----------+--------------+ RIGHT    CompressibilityPhasicitySpontaneityPropertiesThrombus Aging +---------+---------------+---------+-----------+----------+--------------+ CFV      Full           No       Yes                                 +---------+---------------+---------+-----------+----------+--------------+ SFJ      Full           No       Yes                                 +---------+---------------+---------+-----------+----------+--------------+ FV Prox  Full                                                        +---------+---------------+---------+-----------+----------+--------------+ FV Mid   Full                                                        +---------+---------------+---------+-----------+----------+--------------+  FV DistalFull                                                        +---------+---------------+---------+-----------+----------+--------------+ PFV      Full                                                        +---------+---------------+---------+-----------+----------+--------------+ POP      Full           No       Yes                                 +---------+---------------+---------+-----------+----------+--------------+ PTV      Full                                                        +---------+---------------+---------+-----------+----------+--------------+ PERO     Full                                                        +---------+---------------+---------+-----------+----------+--------------+   +---------+---------------+---------+-----------+----------+--------------+ LEFT     CompressibilityPhasicitySpontaneityPropertiesThrombus Aging +---------+---------------+---------+-----------+----------+--------------+ CFV      Full           No       Yes                                  +---------+---------------+---------+-----------+----------+--------------+ SFJ      Full           No       Yes                                 +---------+---------------+---------+-----------+----------+--------------+ FV Prox  Full                                                        +---------+---------------+---------+-----------+----------+--------------+ FV Mid   Full                                                        +---------+---------------+---------+-----------+----------+--------------+ FV DistalFull                                                        +---------+---------------+---------+-----------+----------+--------------+  PFV      Full                                                        +---------+---------------+---------+-----------+----------+--------------+ POP      Full           No       Yes                                 +---------+---------------+---------+-----------+----------+--------------+ PTV      Full                                                        +---------+---------------+---------+-----------+----------+--------------+ PERO     Full                                                        +---------+---------------+---------+-----------+----------+--------------+ SSV      Full           No       Yes                  Chronic        +---------+---------------+---------+-----------+----------+--------------+ Chronic calcified thrombus noted in the left small saphenous vein.    Summary: RIGHT: - There is no evidence of deep vein thrombosis in the lower extremity.  - No cystic structure found in the popliteal fossa.  LEFT: - Findings consistent with chronic superficial vein thrombosis involving the left small saphenous vein.  - No cystic structure found in the popliteal fossa.  *See table(s) above for measurements and observations. Electronically signed by Gaile New MD on 09/29/2024 at 12:45:02 PM.     Final    VAS US  UPPER EXT VEIN MAPPING (PRE-OP  AVF) Result Date: 09/29/2024 UPPER EXTREMITY VEIN MAPPING Patient Name:  Catherine Aguilar  Date of Exam:   09/29/2024 Medical Rec #: [id number]        Accession #:    [id number] Date of Birth: 01/23/1956        Patient Gender: F Patient Age:   80 years Exam Location:  [facility] Procedure:      VAS US  UPPER EXT VEIN MAPPING (PRE-OP  AVF) Referring Phys: GAILE NEW --------------------------------------------------------------------------------  Indications: Pre-access. History: ESRD, volume overload.  Limitations: Limited exam due to patient's uncooperativeness, refused to              continue with exam. Comparison Study: No prior exam. Performing Technologist: Edilia Elden Appl  Examination Guidelines: A complete evaluation includes B-mode imaging, spectral Doppler, color Doppler, and power Doppler as needed of all accessible portions of each vessel. Bilateral testing is considered an integral part of a complete examination. Limited examinations for reoccurring indications may be performed as noted. +-----------------+-------------+----------+--------------+ Right Cephalic   Diameter (cm)Depth (cm)   Findings    +-----------------+-------------+----------+--------------+ Shoulder             0.17  0.80                  +-----------------+-------------+----------+--------------+ Prox upper arm                          not visualized +-----------------+-------------+----------+--------------+ Mid upper arm                           not visualized +-----------------+-------------+----------+--------------+ Dist upper arm                          not visualized +-----------------+-------------+----------+--------------+ Antecubital fossa    0.45        0.55      Thrombus    +-----------------+-------------+----------+--------------+ Prox forearm         0.49        0.43      Thrombus     +-----------------+-------------+----------+--------------+ Mid forearm          0.51        0.36                  +-----------------+-------------+----------+--------------+ Dist forearm         0.19        0.11                  +-----------------+-------------+----------+--------------+ Wrist                0.15        0.13                  +-----------------+-------------+----------+--------------+ +-----------------+-------------+----------+---------+ Right Basilic    Diameter (cm)Depth (cm)Findings  +-----------------+-------------+----------+---------+ Mid upper arm        0.53               branching +-----------------+-------------+----------+---------+ Dist upper arm       0.47               branching +-----------------+-------------+----------+---------+ Antecubital fossa    0.35                         +-----------------+-------------+----------+---------+ Prox forearm         0.17                         +-----------------+-------------+----------+---------+ Mid forearm          0.17                         +-----------------+-------------+----------+---------+ Distal forearm       0.19                         +-----------------+-------------+----------+---------+ Elbow                0.29                         +-----------------+-------------+----------+---------+ Wrist                0.16                         +-----------------+-------------+----------+---------+ +-----------------+-------------+----------+--------------+ Left Cephalic    Diameter (cm)Depth (cm)   Findings    +-----------------+-------------+----------+--------------+ Shoulder  0.21        2.00                  +-----------------+-------------+----------+--------------+ Prox upper arm       0.12        0.73                  +-----------------+-------------+----------+--------------+ Mid upper arm                           not visualized  +-----------------+-------------+----------+--------------+ Dist upper arm                          not visualized +-----------------+-------------+----------+--------------+ Antecubital fossa    0.69        0.35                  +-----------------+-------------+----------+--------------+ Prox forearm         0.48        0.16                  +-----------------+-------------+----------+--------------+ Mid forearm          0.31        0.20                  +-----------------+-------------+----------+--------------+ Dist forearm         0.27        0.14                  +-----------------+-------------+----------+--------------+ Wrist                0.16        0.16                  +-----------------+-------------+----------+--------------+ +--------------+-------------+----------+--------+ Left Basilic  Diameter (cm)Depth (cm)Findings +--------------+-------------+----------+--------+ Mid upper arm     0.80                        +--------------+-------------+----------+--------+ Dist upper arm    0.46                        +--------------+-------------+----------+--------+ Patient refused to continue the exam, left upper extremity is limited. *See table(s) above for measurements and observations.  Diagnosing physician: Gaile New MD Electronically signed by Gaile New MD on 09/29/2024 at 12:44:30 PM.    Final    CT Angio Chest Pulmonary Embolism (PE) W or WO Contrast Result Date: 09/28/2024 EXAM: CTA CHEST 09/28/2024 06:48:36 PM TECHNIQUE: CTA of the chest was performed after the administration of intravenous contrast. Multiplanar reformatted images are provided for review. MIP images are provided for review. Automated exposure control, iterative reconstruction, and/or weight based adjustment of the mA/kV was utilized to reduce the radiation dose to as low as reasonably achievable. COMPARISON: Chest CT 07/09/2024. CLINICAL HISTORY: Pulmonary embolism (PE)  suspected, low to intermediate prob, positive D-dimer. FINDINGS: PULMONARY ARTERIES: Pulmonary arteries are adequately opacified for evaluation. No acute pulmonary embolus. Main pulmonary artery is normal in caliber. MEDIASTINUM: The heart is enlarged. Coronary atherosclerotic calcifications are noted. There is a stable small pericardial effusion. Aortic atherosclerotic calcifications are noted. There is a 6 mm hypodense isthmus nodule. No aortic dissection, intramural hematoma, aortic ulceration, or rupture. LYMPH NODES: No mediastinal, hilar or axillary lymphadenopathy. LUNGS AND PLEURA: Mild emphysema present. Airspace consolidation in the left lower lobe inferiorly and also minimally within  the lingula. Additional tree in bud opacities in the inferior left upper lobe. Secretions in the left lower lobe bronchus. No evidence of pleural effusion or pneumothorax. UPPER ABDOMEN: Cysts in the kidneys. SOFT TISSUES AND BONES: Healed right sided 7th and 8th rib fractures. No acute soft tissue abnormality. IMPRESSION: 1. No evidence of pulmonary embolism. 2. Airspace consolidation in the left lower lobe and inferior left upper lobe compatible with multifocal pneumonia. There are secretions in the left lower lobe bronchus concerning for aspiration. 3. Mild emphysema; pulmonary emphysema is an independent risk factor for lung cancer, recommend consideration for evaluation for a low-dose CT lung cancer screening program. Electronically signed by: Greig Pique MD 09/28/2024 07:03 PM EST RP Workstation: [id number]   DG Chest Port 1 View Result Date: 09/28/2024 CLINICAL DATA:  Acute respiratory distress. EXAM: PORTABLE CHEST 1 VIEW COMPARISON:  September 25, 2024 FINDINGS: Since the prior study there is been interval placement of a right-sided venous catheter with its distal tip noted at the junction of the superior vena cava and right atrium. The cardiac silhouette is enlarged and unchanged in size. There is prominence of  the central pulmonary vasculature. Mild diffusely increased interstitial lung markings are also noted. Mild to moderate severity atelectasis and/or infiltrate is suspected within the retrocardiac region of the left lung base. A small left pleural effusion is also noted. No pneumothorax is identified. Chronic right rib fractures are present. IMPRESSION: 1. Cardiomegaly with mild pulmonary vascular congestion and mild interstitial edema. 2. Mild to moderate severity left basilar atelectasis and/or infiltrate. 3. Small left pleural effusion. Electronically Signed   By: Suzen Dials M.D.   On: 09/28/2024 14:19    ROS Blood pressure (!) 144/64, pulse 89, temperature (!) 100.7 F (38.2 C), temperature source Oral, resp. rate 20, height 5' 7 (1.702 m), weight (S) 77.6 kg, SpO2 90%. Physical Exam Constitutional:      Appearance: patient slumped over in bed and not arousing. She is not having any stridor but looks like she is taking shallow breaths HENT:     Head: Normocephalic and atraumatic.     Right Ear: , ear outer canal and external ear normal.     Left Ear: ear outer canal and external ear normal.     Nose: Nose without  significant swelling or masses.     Oral cavity/oropharynx: Mucous membranes are moist. No lesions or masses    Larynx: normal voice. Mirror attempted without success    Eyes:     Extraocular Movements: Extraocular movements intact.     Conjunctiva/sclera: Conjunctivae normal.     Pupils: Pupils are equal, round, and reactive to light.  Cardiovascular:     Rate and Rhythm: Normal rate.  Pulmonary:     Effort: Pulmonary effort is labored.  Musculoskeletal:     Cervical back: Normal range of motion and neck supple. No rigidity.  Lymphadenopathy:     Cervical: No cervical adenopathy or masses.salivary glands without lesions. .     Salivary glands- no mass or swelling Neurological:     Mental Status: no arousable      Assessment/Plan: Dyspnea- she is current too  sedated to evaluate for vocal cord dysfunction. She needs to give consent plus would not be able to gauge vocal function. Will try later if she becomes more awake.   Norleen Notice 09/29/2024, 9:30 PM        [1] No Known Allergies

## 2024-09-29 NOTE — Assessment & Plan Note (Addendum)
 Remains elevated. - Cardiology consulted, appreciate recommendations - Decrease Carvedilol  back to 12.5 BID given decreased DBP after increasing, continue Hydralazine  100 mg TID - Continue home Amlodipine  10 mg daily and Doxazosin  2 mg daily

## 2024-09-30 ENCOUNTER — Telehealth: Payer: Self-pay

## 2024-09-30 ENCOUNTER — Encounter (HOSPITAL_COMMUNITY)

## 2024-09-30 DIAGNOSIS — E8779 Other fluid overload: Secondary | ICD-10-CM | POA: Diagnosis not present

## 2024-09-30 DIAGNOSIS — N186 End stage renal disease: Secondary | ICD-10-CM | POA: Diagnosis not present

## 2024-09-30 DIAGNOSIS — A4902 Methicillin resistant Staphylococcus aureus infection, unspecified site: Secondary | ICD-10-CM | POA: Diagnosis not present

## 2024-09-30 DIAGNOSIS — J3801 Paralysis of vocal cords and larynx, unilateral: Secondary | ICD-10-CM | POA: Diagnosis not present

## 2024-09-30 DIAGNOSIS — T80211A Bloodstream infection due to central venous catheter, initial encounter: Secondary | ICD-10-CM

## 2024-09-30 DIAGNOSIS — Z992 Dependence on renal dialysis: Secondary | ICD-10-CM | POA: Diagnosis not present

## 2024-09-30 DIAGNOSIS — B9562 Methicillin resistant Staphylococcus aureus infection as the cause of diseases classified elsewhere: Secondary | ICD-10-CM | POA: Diagnosis not present

## 2024-09-30 DIAGNOSIS — I82890 Acute embolism and thrombosis of other specified veins: Secondary | ICD-10-CM | POA: Insufficient documentation

## 2024-09-30 DIAGNOSIS — R7881 Bacteremia: Secondary | ICD-10-CM

## 2024-09-30 LAB — GLUCOSE, CAPILLARY
Glucose-Capillary: 91 mg/dL (ref 70–99)
Glucose-Capillary: 156 mg/dL — ABNORMAL HIGH (ref 70–99)
Glucose-Capillary: 155 mg/dL — ABNORMAL HIGH (ref 70–99)
Glucose-Capillary: 152 mg/dL — ABNORMAL HIGH (ref 70–99)

## 2024-09-30 LAB — CBC
HCT: 22.6 % — ABNORMAL LOW (ref 36.0–46.0)
Hemoglobin: 7.3 g/dL — ABNORMAL LOW (ref 12.0–15.0)
MCH: 26.3 pg (ref 26.0–34.0)
MCHC: 32.3 g/dL (ref 30.0–36.0)
MCV: 81.3 fL (ref 80.0–100.0)
Platelets: 188 K/uL (ref 150–400)
RBC: 2.78 MIL/uL — ABNORMAL LOW (ref 3.87–5.11)
RDW: 17.9 % — ABNORMAL HIGH (ref 11.5–15.5)
WBC: 32.1 K/uL — ABNORMAL HIGH (ref 4.0–10.5)
nRBC: 0.2 % (ref 0.0–0.2)

## 2024-09-30 LAB — BLOOD GAS, VENOUS
Acid-Base Excess: 2.6 mmol/L — ABNORMAL HIGH (ref 0.0–2.0)
Bicarbonate: 27.2 mmol/L (ref 20.0–28.0)
Drawn by: 72402
O2 Saturation: 85.1 %
Patient temperature: 36.9
pCO2, Ven: 41 mmHg — ABNORMAL LOW (ref 44–60)
pH, Ven: 7.43 (ref 7.25–7.43)
pO2, Ven: 49 mmHg — ABNORMAL HIGH (ref 32–45)

## 2024-09-30 LAB — BASIC METABOLIC PANEL WITH GFR
Anion gap: 18 — ABNORMAL HIGH (ref 5–15)
BUN: 56 mg/dL — ABNORMAL HIGH (ref 8–23)
CO2: 24 mmol/L (ref 22–32)
Calcium: 9.1 mg/dL (ref 8.9–10.3)
Chloride: 93 mmol/L — ABNORMAL LOW (ref 98–111)
Creatinine, Ser: 6.71 mg/dL — ABNORMAL HIGH (ref 0.44–1.00)
GFR, Estimated: 6 mL/min — ABNORMAL LOW
Glucose, Bld: 96 mg/dL (ref 70–99)
Potassium: 3.9 mmol/L (ref 3.5–5.1)
Sodium: 134 mmol/L — ABNORMAL LOW (ref 135–145)

## 2024-09-30 LAB — RENAL FUNCTION PANEL
Albumin: 3.6 g/dL (ref 3.5–5.0)
Anion gap: 18 — ABNORMAL HIGH (ref 5–15)
BUN: 57 mg/dL — ABNORMAL HIGH (ref 8–23)
CO2: 24 mmol/L (ref 22–32)
Calcium: 9.1 mg/dL (ref 8.9–10.3)
Chloride: 92 mmol/L — ABNORMAL LOW (ref 98–111)
Creatinine, Ser: 6.6 mg/dL — ABNORMAL HIGH (ref 0.44–1.00)
GFR, Estimated: 6 mL/min — ABNORMAL LOW
Glucose, Bld: 96 mg/dL (ref 70–99)
Phosphorus: 6.6 mg/dL — ABNORMAL HIGH (ref 2.5–4.6)
Potassium: 3.9 mmol/L (ref 3.5–5.1)
Sodium: 134 mmol/L — ABNORMAL LOW (ref 135–145)

## 2024-09-30 LAB — VANCOMYCIN, RANDOM: Vancomycin Rm: 21 ug/mL

## 2024-09-30 MED ORDER — ACETAMINOPHEN 10 MG/ML IV SOLN: 1000.0000 mg | Freq: Once | INTRAVENOUS | Status: DC

## 2024-09-30 MED ORDER — IPRATROPIUM-ALBUTEROL 0.5-2.5 (3) MG/3ML IN SOLN
3.0000 mL | Freq: Four times a day (QID) | RESPIRATORY_TRACT | Status: DC
  Administered 2024-09-30 – 2024-10-01 (×3): 3 mL via RESPIRATORY_TRACT
  Filled 2024-09-30 (×3): qty 3

## 2024-09-30 MED ORDER — MUPIROCIN 2 % EX OINT
1.0000 | TOPICAL_OINTMENT | Freq: Two times a day (BID) | CUTANEOUS | Status: AC
  Administered 2024-09-30 – 2024-10-04 (×10): 1 via NASAL
  Filled 2024-09-30: qty 22

## 2024-09-30 MED ORDER — ACETAMINOPHEN 10 MG/ML IV SOLN: 1000.0000 mg | INTRAVENOUS | Status: DC

## 2024-09-30 MED ORDER — ACETAMINOPHEN 10 MG/ML IV SOLN
1000.0000 mg | INTRAVENOUS | Status: DC
  Filled 2024-09-30: qty 100

## 2024-09-30 NOTE — Progress Notes (Signed)
 Patient ID: Catherine Aguilar, female   DOB: [birth year], 69 y.o.   MRN: [id number] [facility] Progress Note   Assessment/ Plan:   1.  End-stage renal disease: From progressive underlying chronic kidney disease secondary to hypertension, type 2 diabetes mellitus and distant history of ANCA vasculitis.  Started on hemodialysis on 09/26/2024 via temporary right dialysis catheter dialysis catheter and had her third dialysis treatment yesterday (demarcated by restlessness and need for sedation).  I was contacted by the primary service that there was a desire for catheter holiday with Staphylococcus bacteremia and I permitted the removal of her temporary right IJ dialysis catheter (unfortunately still in place).  Anticipate next dialysis on Friday 1/23 after line holiday. 2.  Dyspnea: Overnight with improvement of shortness of breath that appears to be multifactorial including from volume (removed with dialysis).  Seen by ENT yesterday and a follow-up visit to evaluate vocal cord dysfunction. 3.  Hypertension: Blood pressure appears to be currently under decent control with ongoing therapies and UF on HD. 4.  Anemia: Likely secondary to chronic illness, status post ESA as an outpatient on 09/23/2024.  No overt blood loss noted. 5.  Secondary hyperparathyroidism: Calcium  level marginally low when corrected for albumin.  Elevated phosphorus level, will start phosphorus binder.  Subjective:   With restlessness/agitation on dialysis yesterday prompting administration of IV lorazepam .  Unfortunately dialysis discontinued after circuit thrombus.  She reports to be comfortable this morning and denies any shortness of breath.   Objective:   BP 130/65 (BP Location: Right Arm)   Pulse 90   Temp 98.5 F (36.9 C) (Oral)   Resp 20   Ht 5' 7 (1.702 m)   Wt 76.8 kg   SpO2 100%   BMI 26.52 kg/m   Intake/Output Summary (Last 24 hours) at 09/30/2024 0814 Last data filed at 09/29/2024 1820 Gross per 24  hour  Intake --  Output 1400 ml  Net -1400 ml   Weight change: -2.51 kg  Physical Exam: Gen: Comfortably resting in bed, on oxygen via nasal cannula. CVS: Pulse regular rhythm, normal rate, S1 and S2 normal.  Temporary right IJ dialysis catheter. Resp: Fine rales right base otherwise distant breath sounds Abd: Soft, obese, nontender, bowel sounds normal Ext: Trace lower extremity edema  Imaging: VAS US  LOWER EXTREMITY VENOUS (DVT) Result Date: 09/29/2024  Lower Venous DVT Study Patient Name:  Catherine Aguilar  Date of Exam:   09/29/2024 Medical Rec #: [id number]        Accession #:    [id number] Date of Birth: Jun 16, 1956        Patient Gender: F Patient Age:   15 years Exam Location:  [facility] Procedure:      VAS US  LOWER EXTREMITY VENOUS (DVT) Referring Phys: LAMAR PATERSON --------------------------------------------------------------------------------  Indications: Edema, and SOB. Other Indications: Prior CVA. Limitations: Limited exam due to patient's uncooperativeness. Comparison Study: Previous study of the right lower extremity on 7.18.2018. Performing Technologist: Edilia Elden Appl  Examination Guidelines: A complete evaluation includes B-mode imaging, spectral Doppler, color Doppler, and power Doppler as needed of all accessible portions of each vessel. Bilateral testing is considered an integral part of a complete examination. Limited examinations for reoccurring indications may be performed as noted. The reflux portion of the exam is performed with the patient in reverse Trendelenburg.  +---------+---------------+---------+-----------+----------+--------------+ RIGHT    CompressibilityPhasicitySpontaneityPropertiesThrombus Aging +---------+---------------+---------+-----------+----------+--------------+ CFV      Full           No  Yes                                 +---------+---------------+---------+-----------+----------+--------------+ SFJ      Full            No       Yes                                 +---------+---------------+---------+-----------+----------+--------------+ FV Prox  Full                                                        +---------+---------------+---------+-----------+----------+--------------+ FV Mid   Full                                                        +---------+---------------+---------+-----------+----------+--------------+ FV DistalFull                                                        +---------+---------------+---------+-----------+----------+--------------+ PFV      Full                                                        +---------+---------------+---------+-----------+----------+--------------+ POP      Full           No       Yes                                 +---------+---------------+---------+-----------+----------+--------------+ PTV      Full                                                        +---------+---------------+---------+-----------+----------+--------------+ PERO     Full                                                        +---------+---------------+---------+-----------+----------+--------------+   +---------+---------------+---------+-----------+----------+--------------+ LEFT     CompressibilityPhasicitySpontaneityPropertiesThrombus Aging +---------+---------------+---------+-----------+----------+--------------+ CFV      Full           No       Yes                                 +---------+---------------+---------+-----------+----------+--------------+ SFJ      Full  No       Yes                                 +---------+---------------+---------+-----------+----------+--------------+ FV Prox  Full                                                        +---------+---------------+---------+-----------+----------+--------------+ FV Mid   Full                                                         +---------+---------------+---------+-----------+----------+--------------+ FV DistalFull                                                        +---------+---------------+---------+-----------+----------+--------------+ PFV      Full                                                        +---------+---------------+---------+-----------+----------+--------------+ POP      Full           No       Yes                                 +---------+---------------+---------+-----------+----------+--------------+ PTV      Full                                                        +---------+---------------+---------+-----------+----------+--------------+ PERO     Full                                                        +---------+---------------+---------+-----------+----------+--------------+ SSV      Full           No       Yes                  Chronic        +---------+---------------+---------+-----------+----------+--------------+ Chronic calcified thrombus noted in the left small saphenous vein.    Summary: RIGHT: - There is no evidence of deep vein thrombosis in the lower extremity.  - No cystic structure found in the popliteal fossa.  LEFT: - Findings consistent with chronic superficial vein thrombosis involving the left small saphenous vein.  - No cystic structure found in the popliteal fossa.  *See table(s) above for measurements and observations. Electronically signed by Gaile New MD on 09/29/2024 at 12:45:02  PM.    Final    VAS US  UPPER EXT VEIN MAPPING (PRE-OP  AVF) Result Date: 09/29/2024 UPPER EXTREMITY VEIN MAPPING Patient Name:  Catherine Aguilar  Date of Exam:   09/29/2024 Medical Rec #: [id number]        Accession #:    [id number] Date of Birth: 10-Jun-1956        Patient Gender: F Patient Age:   41 years Exam Location:  [facility] Procedure:      VAS US  UPPER EXT VEIN MAPPING (PRE-OP  AVF) Referring Phys: GAILE NEW  --------------------------------------------------------------------------------  Indications: Pre-access. History: ESRD, volume overload.  Limitations: Limited exam due to patient's uncooperativeness, refused to              continue with exam. Comparison Study: No prior exam. Performing Technologist: Edilia Elden Appl  Examination Guidelines: A complete evaluation includes B-mode imaging, spectral Doppler, color Doppler, and power Doppler as needed of all accessible portions of each vessel. Bilateral testing is considered an integral part of a complete examination. Limited examinations for reoccurring indications may be performed as noted. +-----------------+-------------+----------+--------------+ Right Cephalic   Diameter (cm)Depth (cm)   Findings    +-----------------+-------------+----------+--------------+ Shoulder             0.17        0.80                  +-----------------+-------------+----------+--------------+ Prox upper arm                          not visualized +-----------------+-------------+----------+--------------+ Mid upper arm                           not visualized +-----------------+-------------+----------+--------------+ Dist upper arm                          not visualized +-----------------+-------------+----------+--------------+ Antecubital fossa    0.45        0.55      Thrombus    +-----------------+-------------+----------+--------------+ Prox forearm         0.49        0.43      Thrombus    +-----------------+-------------+----------+--------------+ Mid forearm          0.51        0.36                  +-----------------+-------------+----------+--------------+ Dist forearm         0.19        0.11                  +-----------------+-------------+----------+--------------+ Wrist                0.15        0.13                  +-----------------+-------------+----------+--------------+  +-----------------+-------------+----------+---------+ Right Basilic    Diameter (cm)Depth (cm)Findings  +-----------------+-------------+----------+---------+ Mid upper arm        0.53               branching +-----------------+-------------+----------+---------+ Dist upper arm       0.47               branching +-----------------+-------------+----------+---------+ Antecubital fossa    0.35                         +-----------------+-------------+----------+---------+  Prox forearm         0.17                         +-----------------+-------------+----------+---------+ Mid forearm          0.17                         +-----------------+-------------+----------+---------+ Distal forearm       0.19                         +-----------------+-------------+----------+---------+ Elbow                0.29                         +-----------------+-------------+----------+---------+ Wrist                0.16                         +-----------------+-------------+----------+---------+ +-----------------+-------------+----------+--------------+ Left Cephalic    Diameter (cm)Depth (cm)   Findings    +-----------------+-------------+----------+--------------+ Shoulder             0.21        2.00                  +-----------------+-------------+----------+--------------+ Prox upper arm       0.12        0.73                  +-----------------+-------------+----------+--------------+ Mid upper arm                           not visualized +-----------------+-------------+----------+--------------+ Dist upper arm                          not visualized +-----------------+-------------+----------+--------------+ Antecubital fossa    0.69        0.35                  +-----------------+-------------+----------+--------------+ Prox forearm         0.48        0.16                  +-----------------+-------------+----------+--------------+ Mid  forearm          0.31        0.20                  +-----------------+-------------+----------+--------------+ Dist forearm         0.27        0.14                  +-----------------+-------------+----------+--------------+ Wrist                0.16        0.16                  +-----------------+-------------+----------+--------------+ +--------------+-------------+----------+--------+ Left Basilic  Diameter (cm)Depth (cm)Findings +--------------+-------------+----------+--------+ Mid upper arm     0.80                        +--------------+-------------+----------+--------+ Dist upper arm    0.46                        +--------------+-------------+----------+--------+  Patient refused to continue the exam, left upper extremity is limited. *See table(s) above for measurements and observations.  Diagnosing physician: Gaile New MD Electronically signed by Gaile New MD on 09/29/2024 at 12:44:30 PM.    Final    CT Angio Chest Pulmonary Embolism (PE) W or WO Contrast Result Date: 09/28/2024 EXAM: CTA CHEST 09/28/2024 06:48:36 PM TECHNIQUE: CTA of the chest was performed after the administration of intravenous contrast. Multiplanar reformatted images are provided for review. MIP images are provided for review. Automated exposure control, iterative reconstruction, and/or weight based adjustment of the mA/kV was utilized to reduce the radiation dose to as low as reasonably achievable. COMPARISON: Chest CT 07/09/2024. CLINICAL HISTORY: Pulmonary embolism (PE) suspected, low to intermediate prob, positive D-dimer. FINDINGS: PULMONARY ARTERIES: Pulmonary arteries are adequately opacified for evaluation. No acute pulmonary embolus. Main pulmonary artery is normal in caliber. MEDIASTINUM: The heart is enlarged. Coronary atherosclerotic calcifications are noted. There is a stable small pericardial effusion. Aortic atherosclerotic calcifications are noted. There is a 6 mm hypodense  isthmus nodule. No aortic dissection, intramural hematoma, aortic ulceration, or rupture. LYMPH NODES: No mediastinal, hilar or axillary lymphadenopathy. LUNGS AND PLEURA: Mild emphysema present. Airspace consolidation in the left lower lobe inferiorly and also minimally within the lingula. Additional tree in bud opacities in the inferior left upper lobe. Secretions in the left lower lobe bronchus. No evidence of pleural effusion or pneumothorax. UPPER ABDOMEN: Cysts in the kidneys. SOFT TISSUES AND BONES: Healed right sided 7th and 8th rib fractures. No acute soft tissue abnormality. IMPRESSION: 1. No evidence of pulmonary embolism. 2. Airspace consolidation in the left lower lobe and inferior left upper lobe compatible with multifocal pneumonia. There are secretions in the left lower lobe bronchus concerning for aspiration. 3. Mild emphysema; pulmonary emphysema is an independent risk factor for lung cancer, recommend consideration for evaluation for a low-dose CT lung cancer screening program. Electronically signed by: Greig Pique MD 09/28/2024 07:03 PM EST RP Workstation: [id number]   DG Chest Port 1 View Result Date: 09/28/2024 CLINICAL DATA:  Acute respiratory distress. EXAM: PORTABLE CHEST 1 VIEW COMPARISON:  September 25, 2024 FINDINGS: Since the prior study there is been interval placement of a right-sided venous catheter with its distal tip noted at the junction of the superior vena cava and right atrium. The cardiac silhouette is enlarged and unchanged in size. There is prominence of the central pulmonary vasculature. Mild diffusely increased interstitial lung markings are also noted. Mild to moderate severity atelectasis and/or infiltrate is suspected within the retrocardiac region of the left lung base. A small left pleural effusion is also noted. No pneumothorax is identified. Chronic right rib fractures are present. IMPRESSION: 1. Cardiomegaly with mild pulmonary vascular congestion and mild  interstitial edema. 2. Mild to moderate severity left basilar atelectasis and/or infiltrate. 3. Small left pleural effusion. Electronically Signed   By: Suzen Dials M.D.   On: 09/28/2024 14:19    Labs: BMET Recent Labs  Lab 09/26/24 0343 09/27/24 0401 09/28/24 0124 09/28/24 1323 09/29/24 1107 09/30/24 0716 09/30/24 0725  NA 136 135 133* 133* 131* 134* 134*  K 4.4 3.1* 3.6 3.5 4.0 3.9 3.9  CL 96* 95* 92* 93* 90* 93* 92*  CO2 16* 20* 22 24 21* 24 24  GLUCOSE 176* 112* 105* 112* 105* 96 96  BUN 145* 110* 117* 69* 85* 56* 57*  CREATININE 14.40* 10.40* 11.30* 7.34* 8.79* 6.71* 6.60*  CALCIUM  7.4* 8.4* 7.5* 8.1* 8.1* 9.1 9.1  PHOS  --  5.8* 7.4*  --  7.2*  --  6.6*   CBC Recent Labs  Lab 09/23/24 1312 09/23/24 1335 09/25/24 2134 09/26/24 0343 09/27/24 0401 09/28/24 0124 09/29/24 1107 09/30/24 0716  WBC 8.6  --  12.0*   < > 13.6* 16.1* 36.8* 32.1*  NEUTROABS 6.4  --  9.3*  --   --   --   --   --   HGB 6.4*   < > 8.0*   < > 7.3* 7.3* 7.6* 7.3*  HCT 20.4*   < > 24.8*   < > 22.0* 22.8* 23.3* 22.6*  MCV 81.3  --  80.8   < > 78.3* 82.6 80.6 81.3  PLT 143*  --  151   < > 164 170 166 188   < > = values in this interval not displayed.    Medications:     amLODipine   10 mg Oral Daily   amoxicillin -clavulanate  1 tablet Oral Q12H   budesonide -glycopyrrolate -formoterol   2 puff Inhalation Daily   calcium  acetate  667 mg Oral TID WC   carvedilol   12.5 mg Oral BID WC   Chlorhexidine  Gluconate Cloth  6 each Topical Daily   doxazosin   2 mg Oral Daily   heparin   5,000 Units Subcutaneous Q8H   hydrALAZINE   100 mg Oral Q8H   insulin  aspart  0-15 Units Subcutaneous TID WC   ipratropium-albuterol   3 mL Nebulization Q4H   pravastatin   40 mg Oral QHS   sodium chloride  flush  10-40 mL Intracatheter Q12H   vancomycin  variable dose per unstable renal function (pharmacist dosing)   Does not apply See admin instructions   Gordy Blanch, MD 09/30/2024, 8:14 AM

## 2024-09-30 NOTE — Evaluation (Signed)
 Clinical/Bedside Swallow Evaluation Patient Details  Name: Catherine Aguilar MRN: [id number] Date of Birth: 12/14/1955  Today's Date: 09/30/2024 Time: SLP Start Time (ACUTE ONLY): 1310 SLP Stop Time (ACUTE ONLY): 1325 SLP Time Calculation (min) (ACUTE ONLY): 15 min  Past Medical History:  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   CVA (cerebral vascular accident) (HCC) 07/03/2012   Diffuse pulmonary alveolar hemorrhage    Edema of right lower extremity 03/25/2017   Elevated d-dimer 09/26/2024   Essential hypertension 12/08/2016   Hemoptysis 12/08/2016   High cholesterol    HLD (hyperlipidemia) 12/08/2016   Hx of adenomatous polyp of colon 04/16/2017   Hypertension    stopped taking medications 10 years ago   Hypokalemia 07/02/2012   Intra-alveolar hemorrhage 12/08/2016   Left facial numbness 07/02/2012   Sepsis (HCC) 12/08/2016   Shortness of breath 12/02/2017   Stroke (HCC)    Substance abuse (HCC) 12/08/2016   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   Tobacco abuse 12/08/2016   Type 2 diabetes mellitus without complication, with long-term current use of insulin  (HCC) 02/11/2017   Wegener's granulomatosis with renal involvement (HCC) 12/08/2016   Past Surgical History:  Past Surgical History:  Procedure Laterality Date   ABDOMINAL HYSTERECTOMY  1985   IR TUNNELED CENTRAL VENOUS CATH PLC W IMG  09/26/2024   HPI:  Catherine Aguilar is a 69 y.o. F admitted from home for CHF exacerbation. Rapid response called due to increased WOB, placed on Bipap. MD note states rapid response RN  suspected stridor are more consistent with vocal cord dysfunction. ENT has been consulted. CT chest airspace consolidation in the left lower lobe and inferior left upper lobe compatible with multifocal pneumonia. There are secretions in the left lower  lobe bronchus concerning for aspiration, mild emphysema. BSE in 2018 normal oropharyngeal function, reg/thin rec'd.  PMHx of T2DM, HTN, CKD stage V, anemia of chronic disease, COPD, Wegener's granulomatosis with renal involvement who is admitted for CHF exacerbation.    Assessment / Plan / Recommendation  Clinical Impression  A bedside swallow evaluation was completed to assess for s/sx of oropharyngeal dysphagia. Oral mechanism exam WFL. POs administered included ice chips, thin liquids, puree and solids. Patient with timely mastication and complete oral clearance. No s/sx of aspiration present. Recommend continuation of regular/thin diet with use of standardized precautions including sitting upright during PO, taking small bites/sips at a slow rate and medications whole in water. SLP to continue to follow as patient with suspected aspiration PNA and decreased respiratory status. Patient may benefit from objective swallow study if s/sx of aspiration are present.   SLP Visit Diagnosis: Dysphagia, unspecified (R13.10)    Aspiration Risk  Mild aspiration risk    Diet Recommendation Regular;Thin liquid    Liquid Administration via: Cup;Straw Medication Administration: Whole meds with liquid Supervision: Patient able to self feed Compensations: Slow rate;Small sips/bites Postural Changes: Seated upright at 90 degrees    Other Recommendations Oral Care Recommendations: Oral care BID      Frequency and Duration min 1 x/week  1 week       Prognosis Prognosis  for improved oropharyngeal function: Good      Swallow Study   General HPI: Catherine Aguilar is a 69 y.o. F admitted from home for CHF exacerbation. Rapid response called due to increased  WOB, placed on Bipap. MD note states rapid response RN suspected stridor are more consistent with vocal cord dysfunction. ENT has been consulted. CT chest airspace consolidation in the left lower lobe and inferior left upper lobe compatible with multifocal pneumonia. There are secretions in the left lower  lobe bronchus concerning for aspiration, mild emphysema. BSE in 2018 normal oropharyngeal function, reg/thin rec'd.  PMHx of T2DM, HTN, CKD stage V, anemia of chronic disease, COPD, Wegener's granulomatosis with renal involvement who is admitted for CHF exacerbation. Type of Study: Bedside Swallow Evaluation Previous Swallow Assessment: 2018- reg/thin Diet Prior to this Study: Regular;Thin liquids (Level 0) Respiratory Status: Nasal cannula Behavior/Cognition: Lethargic/Drowsy;Cooperative Oral Cavity Assessment: Within Functional Limits Oral Care Completed by SLP: No Vision: Functional for self-feeding Self-Feeding Abilities: Able to feed self Patient Positioning: Upright in bed Baseline Vocal Quality: Hoarse Volitional Cough: Strong Volitional Swallow: Able to elicit    Oral/Motor/Sensory Function Overall Oral Motor/Sensory Function: Within functional limits   Ice Chips Ice chips: Within functional limits Presentation: Spoon   Thin Liquid Thin Liquid: Within functional limits Presentation: Spoon;Straw;Cup    Nectar Thick Nectar Thick Liquid: Not tested   Honey Thick Honey Thick Liquid: Not tested   Puree Puree: Within functional limits Presentation: Self Fed;Spoon   Solid     Solid: Within functional limits Presentation: Self Fed      Machaela Caterino M.A., CCC-SLP 09/30/2024,1:29 PM

## 2024-09-30 NOTE — Progress Notes (Signed)
 Patient's temporary HD catheter ( non-tunneled) removed as per the order, patient was able to tolerate the procedure, occlusive dressing applied, V/S stable, plan of care continues.

## 2024-09-30 NOTE — Progress Notes (Addendum)
 Pharmacy Antibiotic Note  Catherine Aguilar is a 69 y.o. female admitted on 09/25/2024 with pneumonia and bacteremia.  Pharmacy has been consulted for Vancomycin  dosing.  Plan: Vancomycin  1750 IV x 1 on 1/19 Vancomycin  Random level therapeutic today (1/21) at 21, will get a repeat Vancomycin  Random level on 1/23 with AM labs given patient does not have good urine output, would not expect her to be subtherapeutic at this time Continue to monitor repeat blood cultures for clearance of infection Continue to monitor renal function, HD recommendations per Nephrology, and s/sx of clinical worsening Continue Augmentin  for aspiration pneumonia treatment  Height: 5' 7 (170.2 cm) Weight: 76.8 kg (169 lb 5 oz) IBW/kg (Calculated) : 61.6  Temp (24hrs), Avg:100.2 F (37.9 C), Min:98.5 F (36.9 C), Max:101.2 F (38.4 C)  Recent Labs  Lab 09/26/24 0343 09/27/24 0401 09/28/24 0124 09/28/24 1323 09/29/24 1107 09/30/24 0716 09/30/24 0725  WBC 14.7* 13.6* 16.1*  --  36.8* 32.1*  --   CREATININE 14.40* 10.40* 11.30* 7.34* 8.79* 6.71* 6.60*  VANCORANDOM  --   --   --   --   --  21  --     Estimated Creatinine Clearance: 8.6 mL/min (A) (by C-G formula based on SCr of 6.6 mg/dL (H)).    Allergies[1]  Antimicrobials this admission: VAN 1750 mg x 1 on 1/19 Augmentin  01/20 >> 01/25 Zosyn  01/19 >> 01/20  Microbiology results: 01/21 VR: 21  01/19 MRSA Nares: positive 01/19 Bcx: GPC in clusters (most likely MRSA) 01/19: BCID: MRSA bacteremia 01/20: Sputum culture  Thank you for allowing pharmacy to be a part of this patients care.  R. Samual Satterfield, PharmD PGY-1 Acute Care Pharmacy Resident [facility] Please refer to [facility] for [facility] Pharmacy numbers 09/30/2024 5:17 PM      [1] No Known Allergies

## 2024-09-30 NOTE — Assessment & Plan Note (Addendum)
 Hgb stable at 7.3. - Transfusion threshold of <8 - Daily CBC

## 2024-09-30 NOTE — Assessment & Plan Note (Addendum)
 Cr 8.79 > 6.6, s/p dialysis x3. 1400 total output, weight down 6kg. TDC and AVF/G surgery with VVS pending resolution of MRSA bacteremia.  - Nephrology consulted, appreciate recommendations. - Okay to remove temp HD cath as above - Next dialysis likely 1/23 - Tylenol  650 mg q6hrs PRN PO or rectal for pain - Avoid nephrotoxic agents - Strict I&Os, daily weights - Daily CBC, RFP

## 2024-09-30 NOTE — Care Management Important Message (Signed)
 Important Message  Patient Details  Name: Catherine Aguilar MRN: [id number] Date of Birth: [birth year]   Important Message Given:  Yes - Medicare IM     [name] Sharps 09/30/2024, 9:40 AM

## 2024-09-30 NOTE — Plan of Care (Signed)
" °  Problem: Health Behavior/Discharge Planning: Goal: Ability to manage health-related needs will improve Outcome: Not Progressing   Problem: Activity: Goal: Activity intolerance will improve Outcome: Not Progressing   "

## 2024-09-30 NOTE — Assessment & Plan Note (Addendum)
 Improved after dialysis. Continue to monitor given patient will not be able to receive dialysis until 1/23. - Cardiology consulted, appreciate recommendations - Continue Carvedilol  12.5 BID, Hydralazine  100 mg TID - Continue home Amlodipine  10 mg daily and Doxazosin  2 mg daily

## 2024-09-30 NOTE — Assessment & Plan Note (Addendum)
 Suspect this may be contributing to increased WOB, especially given patient's hx of Wegener's granulomatosis. ENT unable to evaluate patient 1/20 as she appeared to be sedated. Will follow recommendations as they become available. - ENT consulted, appreciate recommendations - Hydroxyzine  10mg  TID PRN

## 2024-09-30 NOTE — Plan of Care (Signed)
" °  Problem: Health Behavior/Discharge Planning: Goal: Ability to manage health-related needs will improve Outcome: Not Progressing   Problem: Activity: Goal: Activity intolerance will improve Outcome: Not Progressing   Problem: Health Behavior/Discharge Planning: Goal: Ability to manage health-related needs will improve Outcome: Not Progressing   "

## 2024-09-30 NOTE — Assessment & Plan Note (Signed)
 Bcx with MRSA likely 2/2 temp HD line. Patient fevered overnight with Tmax 101.2; given Tylenol  x2 and Bcx repeated. VBG stable. Afebrile this morning, states she feels fine. WBC 36.8 > 32.1. Discontinued sputum culture as it has yet to be collected and focusing on treatment for MRSA - ID consulted, appreciate recommendations - Line holiday x 48hrs (PIVs okay to keep in place) - Continue vancomycin  per pharmacy (1/19 - ) - Continue PO Augmentin  250-125 mg q12hrs (1/20-1/24) for PNA - Discontinue Zosyn  2.25 g q8hrs (1/19-1/20) - TTE not necessary at this time - Temp HD cath to be removed today by IV access team which was cleared with nephrology - F/u Bcx from 1/21 but patient will likely require repeat Bcx 48hrs after line removal to ensure MRSA clearance

## 2024-09-30 NOTE — Progress Notes (Signed)
 "    Daily Progress Note Intern Pager: [phone]  Patient name: Catherine Aguilar Medical record number: [id number] Date of birth: [birth year] Age: 69 y.o. Gender: female  Primary Care Provider: Adele Song, MD Consultants: Nephrology, cardiology, VVS, ENT  Code Status: Full code  Pt Overview and Major Events to Date:  1/17 - admitted, temp HD catheter placed 1/18 - initiated dialysis 1/19 - dialysis; rapid response called x2 due to SOB and increased WOB 1/20 - dialysis; rapid response called overnight for inc WOB 1/21 - temp HD cath removed 2/2 MRSA bacteremia  Assessment and Plan: Catherine Aguilar is a 69 y.o. F with PMHx of T2DM, HTN, CKD stage V, anemia of chronic disease, COPD, Wegener's granulomatosis with renal involvement who was admitted for CHF exacerbation likely 2/2 volume overload from CKD5. Temp cath placed 1/17 and diuresing well but now with  MRSA bacteremia likely from HD cath. Plan to remove today for lab holiday and continue antibiotic treatment . ENT to evaluate for suspected VCD. Assessment & Plan MRSA bacteremia Multifocal pneumonia Bcx with MRSA likely 2/2 temp HD line. Patient fevered overnight with Tmax 101.2; given Tylenol  x2 and Bcx repeated. VBG stable. Afebrile this morning, states she feels fine. WBC 36.8 > 32.1. Discontinued sputum culture as it has yet to be collected and focusing on treatment for MRSA - ID consulted, appreciate recommendations - Line holiday x 48hrs (PIVs okay to keep in place) - Continue vancomycin  per pharmacy (1/19 - ) - Continue PO Augmentin  250-125 mg q12hrs (1/20-1/24) for PNA - Discontinue Zosyn  2.25 g q8hrs (1/19-1/20) - TTE not necessary at this time - Temp HD cath to be removed today by IV access team which was cleared with nephrology - F/u Bcx from 1/21 but patient will likely require repeat Bcx 48hrs after line removal to ensure MRSA clearance Suspected vocal cord dysfunction Shortness of breath Suspect this may be  contributing to increased WOB, especially given patient's hx of Wegener's granulomatosis. ENT unable to evaluate patient 1/20 as she appeared to be sedated. Will follow recommendations as they become available. - ENT consulted, appreciate recommendations - Hydroxyzine  10mg  TID PRN Acute kidney injury superimposed on stage 5 chronic kidney disease, not on chronic dialysis (HCC) Volume overload Acute on chronic congestive heart failure (HCC) Cr 8.79 > 6.6, s/p dialysis x3. 1400 total output, weight down 6kg. TDC and AVF/G surgery with VVS pending resolution of MRSA bacteremia.  - Nephrology consulted, appreciate recommendations. - Okay to remove temp HD cath as above - Next dialysis likely 1/23 - Tylenol  650 mg q6hrs PRN PO or rectal for pain - Avoid nephrotoxic agents - Strict I&Os, daily weights - Daily CBC, RFP Elevated d-dimer Chronic superficial vein thrombosis involving L small saphenous vein D-dimer elevated to 2.08 on admission. - No need for chronic anticoagulation per vascular surgery - Continue heparin  at VTE prophylaxis dose COPD (chronic obstructive pulmonary disease) (HCC) Patient wheezes at baseline. - Continue home Breztri  inhaler 2 puffs daily  - Continue DuoNebs q4hrs PRN - SCH + PRN - Benzonatate  200 mg TID PRN for cough Stage 5 chronic kidney disease not on chronic dialysis (HCC) Hgb stable at 7.3. - Transfusion threshold of <8 - Daily CBC Essential hypertension Improved after dialysis. Continue to monitor given patient will not be able to receive dialysis until 1/23. - Cardiology consulted, appreciate recommendations - Continue Carvedilol  12.5 BID, Hydralazine  100 mg TID - Continue home Amlodipine  10 mg daily and Doxazosin  2 mg daily Chronic health problem T2DM: hold nighttime  Lantus  on admission, moderate SSI HLD: continue home Pravastatin  40 mg daily CAD: Holding ASA in the setting of anemia requiring recurrent transfusions per cardiology   FEN/GI: Renal  with fluid restriction PPx: Heparin  Dispo:Pending PT recommendations  pending clinical improvement   Subjective:  Patient laying in bed with eyes closed but responding appropriately to questions. Denies any increased SOB or chest pain. Discussed MRSA bacteremia and plan to remove temp HD cath today. No other concerns.  Objective: Temp:  [99 F (37.2 C)-101.2 F (38.4 C)] 101.2 F (38.4 C) (01/21 0504) Pulse Rate:  [82-98] 88 (01/21 0504) Resp:  [15-22] 20 (01/21 0504) BP: (106-155)/(54-93) 148/78 (01/21 0504) SpO2:  [90 %-100 %] 94 % (01/21 0500) Weight:  [76.8 kg-80.7 kg] 76.8 kg (01/21 0504)  Physical Exam: General: Awake, drowsy but alert, NAD Cardiovascular: RRR, systolic murmur appreciated Pulmonary: Minimally increased WOB on 2L [place]. Extremities: No edema Neurologic: No focal deficits  Laboratory: Most recent CBC Lab Results  Component Value Date   WBC 36.8 (H) 09/29/2024   HGB 7.6 (L) 09/29/2024   HCT 23.3 (L) 09/29/2024   MCV 80.6 09/29/2024   PLT 166 09/29/2024   Most recent BMP    Latest Ref Rng & Units 09/29/2024   11:07 AM  BMP  Glucose 70 - 99 mg/dL 894   BUN 8 - 23 mg/dL 85   Creatinine 9.55 - 1.00 mg/dL 1.20   Sodium 864 - 854 mmol/L 131   Potassium 3.5 - 5.1 mmol/L 4.0   Chloride 98 - 111 mmol/L 90   CO2 22 - 32 mmol/L 21   Calcium  8.9 - 10.3 mg/dL 8.1     [name], DO 09/30/2024, 7:12 AM  PGY-1, [facility] FPTS Intern pager: [phone], text pages welcome Secure chat group [facility] Teaching Service   "

## 2024-09-30 NOTE — Plan of Care (Signed)
" °  Problem: Fluid Volume: Goal: Ability to maintain a balanced intake and output will improve Outcome: Progressing   Problem: Metabolic: Goal: Ability to maintain appropriate glucose levels will improve Outcome: Progressing   Problem: Nutritional: Goal: Maintenance of adequate nutrition will improve Outcome: Progressing Goal: Progress toward achieving an optimal weight will improve Outcome: Progressing   Problem: Skin Integrity: Goal: Risk for impaired skin integrity will decrease Outcome: Progressing   Problem: Tissue Perfusion: Goal: Adequacy of tissue perfusion will improve Outcome: Progressing   Problem: Clinical Measurements: Goal: Complications related to the disease process or treatment will be avoided or minimized Outcome: Progressing Goal: Dialysis access will remain free of complications Outcome: Progressing   "

## 2024-09-30 NOTE — Assessment & Plan Note (Addendum)
 D-dimer elevated to 2.08 on admission. - No need for chronic anticoagulation per vascular surgery - Continue heparin  at VTE prophylaxis dose

## 2024-09-30 NOTE — Progress Notes (Addendum)
 Patient ID: Catherine Aguilar, female   DOB: October 13, 1955, 69 y.o.   MRN: [id number]  She today is a bit more awake and can now discussed the fiberoptic scope.  She agreed to it after discussing risk, benefits, and options.  All questions were answered and consent was obtained. The flexible fiberoptic scope was inserted through her right nares.  The nasopharynx looks clear.  The base of tongue is clear of any lesions.  The larynx looks normal except for some slight amount of swelling mostly in the vocal cords and arytenoid region.  There are still a open airway.  The left vocal cord is thicker than the right and the left vocal cord is paralyzed in the paramedian position.  There is no lesions identified.  The patient was not stridorous at the time of the exam.  She tolerated it well.  She has a left vocal cord paralysis which undoubtedly contributes to her problem especially in the face of some mild or moderate swelling.  She seems to be improving.  She has had chest CT scan with no mass that would cause. The neck should have CT scan for evaluation of the track of the vagus nerve. Normally you do not have to do any airway intervention with a unilateral vocal cord paralysis but in her situation it is possible.  If she continues to have respiratory distress it may require intubation for a while and later an outpatient discussion about what to do about the vocal cord paralysis can be made.  If she continues to do poorly after managing her airway with intubation then a trach may be necessary if this continues to be a persistent laryngeal swelling issue.

## 2024-09-30 NOTE — Telephone Encounter (Signed)
 Dr. Avram,   Just as an FYI I am going to cancel this patient PV and colonoscopy. Looks like she is currently in the hospital and scheduled to have AV fistula placement on the 23rd. I left her a VM making her aware as well as the need to have an OV prior to her being rescheduled for her colonoscopy.

## 2024-09-30 NOTE — Assessment & Plan Note (Addendum)
 T2DM: hold nighttime Lantus  on admission, moderate SSI HLD: continue home Pravastatin  40 mg daily CAD: Holding ASA in the setting of anemia requiring recurrent transfusions per cardiology

## 2024-09-30 NOTE — Assessment & Plan Note (Addendum)
 Patient wheezes at baseline. - Continue home Breztri  inhaler 2 puffs daily  - Continue DuoNebs q4hrs PRN - SCH + PRN - Benzonatate  200 mg TID PRN for cough

## 2024-09-30 NOTE — Progress Notes (Signed)
 His mental status improved.  She has no stridor no wheeze.  She is on 2 L nasal cannula saturating above 95 persistently.  Pending further ENT evaluation.  Patient is on appropriate antibiotic therapy per primary team.  ICU will sign off.  Tamela Stakes, MD  Attending Physician, Critical Care Medicine [place] [facility] See Amion for pager If no response to pager, please call [phone] until 7pm After 7pm, Please call E-link [phone]

## 2024-09-30 NOTE — Assessment & Plan Note (Signed)
 Suspect this may be contributing to increased WOB, especially given patient's hx of Wegener's granulomatosis. ENT unable to evaluate patient 1/20 as she appeared to be sedated. Will follow recommendations as they become available. - ENT consulted, appreciate recommendations - Hydroxyzine  10mg  TID PRN

## 2024-09-30 NOTE — Assessment & Plan Note (Signed)
" >>  ASSESSMENT AND PLAN FOR SUSPECTED VOCAL CORD DYSFUNCTION WRITTEN ON 09/30/2024  3:00 PM BY Amariyon Maynes, DO  Suspect this may be contributing to increased WOB, especially given patient's hx of Wegener's granulomatosis. ENT unable to evaluate patient 1/20 as she appeared to be sedated. Will follow recommendations as they become available. - ENT consulted, appreciate recommendations - Hydroxyzine  10mg  TID PRN "

## 2024-09-30 NOTE — Assessment & Plan Note (Addendum)
 Bcx with MRSA likely 2/2 temp HD line. Patient fevered overnight with Tmax 101.2; given Tylenol  x2 and Bcx repeated. VBG stable. Afebrile this morning, states she feels fine. WBC 36.8 > 32.1. Discontinued sputum culture as it has yet to be collected and focusing on treatment for MRSA - ID consulted, appreciate recommendations - Line holiday x 48hrs (PIVs okay to keep in place) - Continue vancomycin  per pharmacy (1/19 - ) - Continue PO Augmentin  250-125 mg q12hrs (1/20-1/24) for PNA - Discontinue Zosyn  2.25 g q8hrs (1/19-1/20) - TTE not necessary at this time - Temp HD cath to be removed today by IV access team which was cleared with nephrology - F/u Bcx from 1/21 but patient will likely require repeat Bcx 48hrs after line removal to ensure MRSA clearance

## 2024-09-30 NOTE — Assessment & Plan Note (Signed)
 D-dimer elevated to 2.08 on admission. - No need for chronic anticoagulation per vascular surgery - Continue heparin  at VTE prophylaxis dose

## 2024-10-01 DIAGNOSIS — J690 Pneumonitis due to inhalation of food and vomit: Secondary | ICD-10-CM

## 2024-10-01 DIAGNOSIS — T80211D Bloodstream infection due to central venous catheter, subsequent encounter: Secondary | ICD-10-CM

## 2024-10-01 DIAGNOSIS — J383 Other diseases of vocal cords: Secondary | ICD-10-CM

## 2024-10-01 DIAGNOSIS — Z794 Long term (current) use of insulin: Secondary | ICD-10-CM

## 2024-10-01 DIAGNOSIS — B9562 Methicillin resistant Staphylococcus aureus infection as the cause of diseases classified elsewhere: Secondary | ICD-10-CM

## 2024-10-01 DIAGNOSIS — T827XXA Infection and inflammatory reaction due to other cardiac and vascular devices, implants and grafts, initial encounter: Secondary | ICD-10-CM | POA: Diagnosis not present

## 2024-10-01 DIAGNOSIS — E119 Type 2 diabetes mellitus without complications: Secondary | ICD-10-CM | POA: Diagnosis not present

## 2024-10-01 DIAGNOSIS — N185 Chronic kidney disease, stage 5: Secondary | ICD-10-CM | POA: Diagnosis not present

## 2024-10-01 DIAGNOSIS — R7881 Bacteremia: Secondary | ICD-10-CM | POA: Diagnosis not present

## 2024-10-01 DIAGNOSIS — Z992 Dependence on renal dialysis: Secondary | ICD-10-CM | POA: Diagnosis not present

## 2024-10-01 DIAGNOSIS — N186 End stage renal disease: Secondary | ICD-10-CM

## 2024-10-01 DIAGNOSIS — N179 Acute kidney failure, unspecified: Secondary | ICD-10-CM | POA: Diagnosis not present

## 2024-10-01 LAB — RENAL FUNCTION PANEL
Albumin: 3.3 g/dL — ABNORMAL LOW (ref 3.5–5.0)
Anion gap: 18 — ABNORMAL HIGH (ref 5–15)
BUN: 71 mg/dL — ABNORMAL HIGH (ref 8–23)
CO2: 23 mmol/L (ref 22–32)
Calcium: 9 mg/dL (ref 8.9–10.3)
Chloride: 88 mmol/L — ABNORMAL LOW (ref 98–111)
Creatinine, Ser: 7.92 mg/dL — ABNORMAL HIGH (ref 0.44–1.00)
GFR, Estimated: 5 mL/min — ABNORMAL LOW
Glucose, Bld: 102 mg/dL — ABNORMAL HIGH (ref 70–99)
Phosphorus: 7.4 mg/dL — ABNORMAL HIGH (ref 2.5–4.6)
Potassium: 3.8 mmol/L (ref 3.5–5.1)
Sodium: 130 mmol/L — ABNORMAL LOW (ref 135–145)

## 2024-10-01 LAB — GLUCOSE, CAPILLARY
Glucose-Capillary: 145 mg/dL — ABNORMAL HIGH (ref 70–99)
Glucose-Capillary: 158 mg/dL — ABNORMAL HIGH (ref 70–99)
Glucose-Capillary: 231 mg/dL — ABNORMAL HIGH (ref 70–99)
Glucose-Capillary: 137 mg/dL — ABNORMAL HIGH (ref 70–99)

## 2024-10-01 LAB — CULTURE, BLOOD (ROUTINE X 2)
Special Requests: ADEQUATE
Special Requests: ADEQUATE

## 2024-10-01 LAB — CBC
HCT: 21.7 % — ABNORMAL LOW (ref 36.0–46.0)
Hemoglobin: 7 g/dL — ABNORMAL LOW (ref 12.0–15.0)
MCH: 26.4 pg (ref 26.0–34.0)
MCHC: 32.3 g/dL (ref 30.0–36.0)
MCV: 81.9 fL (ref 80.0–100.0)
Platelets: 184 K/uL (ref 150–400)
RBC: 2.65 MIL/uL — ABNORMAL LOW (ref 3.87–5.11)
RDW: 17.6 % — ABNORMAL HIGH (ref 11.5–15.5)
WBC: 30 K/uL — ABNORMAL HIGH (ref 4.0–10.5)
nRBC: 0.1 % (ref 0.0–0.2)

## 2024-10-01 MED ORDER — IPRATROPIUM-ALBUTEROL 0.5-2.5 (3) MG/3ML IN SOLN
3.0000 mL | Freq: Two times a day (BID) | RESPIRATORY_TRACT | Status: DC
  Administered 2024-10-01 – 2024-10-05 (×3): 3 mL via RESPIRATORY_TRACT
  Filled 2024-10-01 (×7): qty 3

## 2024-10-01 NOTE — Assessment & Plan Note (Addendum)
 L vocal cord paralysis seen on fiberoptic scope which is likely contributing to SOB. - ENT consulted, appreciate recommendations - Recommend CT neck with contrast for evaluation of vagus nerve tract - If condition worsens, may require intubation or even tracheostomy w/ outpt management.  - Appropriate to wait for CT neck with contrast until patient is able to resume dialysis; this was discussed with both nephrology and ENT. - Hydroxyzine  10mg  TID PRN

## 2024-10-01 NOTE — Progress Notes (Addendum)
" °  Progress Note    10/01/2024 8:04 AM * Surgery Date in Future *  Subjective:  drowsy this morning but responds appropriately    Vitals:   10/01/24 0313 10/01/24 0410  BP: 132/60   Pulse: 78 87  Resp: 20 (!) 22  Temp: 98.5 F (36.9 C)   SpO2: 97% 95%   Physical Exam: Cardiac:  regular Lungs:  non labored, 5L [place] Extremities:  left arm well perfused and warm  Neurologic: drowsy  CBC    Component Value Date/Time   WBC 30.0 (H) 10/01/2024 0321   RBC 2.65 (L) 10/01/2024 0321   HGB 7.0 (L) 10/01/2024 0321   HGB 8.2 (L) 12/30/2023 1123   HCT 21.7 (L) 10/01/2024 0321   HCT 25.7 (L) 12/30/2023 1123   PLT 184 10/01/2024 0321   PLT 235 12/30/2023 1123   MCV 81.9 10/01/2024 0321   MCV 81 12/30/2023 1123   MCH 26.4 10/01/2024 0321   MCHC 32.3 10/01/2024 0321   RDW 17.6 (H) 10/01/2024 0321   RDW 16.4 (H) 12/30/2023 1123   LYMPHSABS 1.5 09/25/2024 2134   MONOABS 0.9 09/25/2024 2134   EOSABS 0.2 09/25/2024 2134   BASOSABS 0.1 09/25/2024 2134    BMET    Component Value Date/Time   NA 130 (L) 10/01/2024 0321   NA 138 01/07/2024 1507   K 3.8 10/01/2024 0321   CL 88 (L) 10/01/2024 0321   CO2 23 10/01/2024 0321   GLUCOSE 102 (H) 10/01/2024 0321   BUN 71 (H) 10/01/2024 0321   BUN 89 (HH) 01/07/2024 1507   CREATININE 7.92 (H) 10/01/2024 0321   CALCIUM  9.0 10/01/2024 0321   CALCIUM  8.7 12/11/2023 0623   GFRNONAA 5 (L) 10/01/2024 0321   GFRAA 17 (L) 09/02/2017 2149    INR    Component Value Date/Time   INR 1.0 09/23/2024 1312     Intake/Output Summary (Last 24 hours) at 10/01/2024 0804 Last data filed at 09/30/2024 1100 Gross per 24 hour  Intake 240 ml  Output --  Net 240 ml     Assessment/Plan:  69 y.o. female now with ESRD in need of TDC and permanent access  Her temp catheter was removed yesterday for line holiday with her MRSA bacteremia  Her WBC 30K. Afebrile this morning On Augmentin  and Vanc Hgb 7.0 this morning. Transfuse per primary team VSS If  she remains stable overnight plan is for insertion of TDC and left AV fistula vs AF graft tomorrow in OR with Dr Serene Keep NPO after midnight Consent ordered  Teretha Damme, PA-C [facility] [phone] 10/01/2024 8:04 AM   I agree with the above.  The patient is on the schedule tomorrow for [facility] and access however her white count remains significantly elevated.  I would be reluctant to proceed with access if there are still infectious concerns.  We will monitor this in the morning and plan accordingly.  Wells Jakai Onofre "

## 2024-10-01 NOTE — Plan of Care (Signed)
   Problem: Education: Goal: Ability to describe self-care measures that may prevent or decrease complications (Diabetes Survival Skills Education) will improve Outcome: Progressing   Problem: Coping: Goal: Ability to adjust to condition or change in health will improve Outcome: Progressing   Problem: Fluid Volume: Goal: Ability to maintain a balanced intake and output will improve Outcome: Progressing

## 2024-10-01 NOTE — Assessment & Plan Note (Addendum)
 Cr 6.6 > 7.92, s/p dialysis x3. No documented output, weight 76.8 > 78.5. TDC and AVF/G surgery with VVS pending resolution of MRSA bacteremia. Line holiday until 1/23. - Nephrology consulted, appreciate recommendations. - Plan for new dialysis catheter to be placed tomorrow, will likely be temporary given ongoing leukocytosis - Next dialysis likely 1/23 - Tylenol  650 mg q6hrs PRN PO or rectal for pain - Avoid nephrotoxic agents - Strict I&Os, daily weights - Daily CBC, RFP

## 2024-10-01 NOTE — Progress Notes (Signed)
 Pt's case discussed with nephrologist and he feels pt is appropriate to submit referral for out-pt HD at d/c. Referral submitted to [facility] admissions today. Will await determination and assist as needed.   Randine Mungo Dialysis Navigator [phone]

## 2024-10-01 NOTE — Assessment & Plan Note (Addendum)
 T2DM: hold nighttime Lantus  on admission, moderate SSI HLD: continue home Pravastatin  40 mg daily CAD: Holding ASA in the setting of anemia requiring recurrent transfusions per cardiology Chronic SVT of L small saphenous vein - no chronic anticoagulatin per vascular

## 2024-10-01 NOTE — Progress Notes (Signed)
 "    Daily Progress Note Intern Pager: [phone]  Patient name: Catherine Aguilar Medical record number: [id number] Date of birth: [birth year] Age: 69 y.o. Gender: female  Primary Care Provider: Adele Song, MD Consultants: Nephrology, cardiology, VVS, ENT  Code Status: Full code  Pt Overview and Major Events to Date:  1/17 - admitted, temp HD catheter placed 1/18 - initiated dialysis 1/19 - dialysis; rapid response called x2 due to SOB and increased WOB 1/20 - dialysis; rapid response called overnight for inc WOB 1/21 - temp HD cath removed 2/2 MRSA bacteremia  Assessment and Plan: Catherine Aguilar is a 69 y.o. F with PMHx of T2DM, HTN, CKD stage V, anemia of chronic disease, COPD, Wegener's granulomatosis with renal involvement who was admitted for CHF exacerbation likely 2/2 volume overload from CKD5. Temp cath placed 1/17 and diuresing well but now with  MRSA bacteremia likely from HD cath. Plan to remove today for lab holiday and continue antibiotic treatment . ENT to evaluate for suspected VCD.  Assessment & Plan MRSA bacteremia Multifocal pneumonia Bcx with MRSA likely 2/2 temp HD line. Afebrile, leukocytosis improving. - ID consulted, appreciate recommendations - Continue vancomycin  per pharmacy (1/19 - ) - Continue PO Augmentin  250-125 mg q12hrs (1/20-1/24) for PNA - Line holiday x48hrs (1/21-1/23) - Bcx from 1/21 NG < 24hrs - Will need repeat Bcx 48hrs after line removal (1/23) to ensure MRSA clearance Suspected vocal cord dysfunction Shortness of breath L vocal cord paralysis seen on fiberoptic scope which is likely contributing to SOB. - ENT consulted, appreciate recommendations - Recommend CT neck with contrast for evaluation of vagus nerve tract - If condition worsens, may require intubation or even tracheostomy w/ outpt management.  - Appropriate to wait for CT neck with contrast until patient is able to resume dialysis; this was discussed with both nephrology and ENT.  - Hydroxyzine  10mg  TID PRN Acute kidney injury superimposed on stage 5 chronic kidney disease, not on chronic dialysis (HCC) Volume overload Acute on chronic congestive heart failure (HCC) ESRD on hemodialysis (HCC) Cr 6.6 > 7.92, s/p dialysis x3. No documented output, weight 76.8 > 78.5. TDC and AVF/G surgery with VVS pending resolution of MRSA bacteremia. Line holiday until 1/23. - Nephrology consulted, appreciate recommendations. - Plan for new dialysis catheter to be placed tomorrow, will likely be temporary given ongoing leukocytosis - Next dialysis likely 1/23 - Tylenol  650 mg q6hrs PRN PO or rectal for pain - Avoid nephrotoxic agents - Strict I&Os, daily weights - Daily CBC, RFP COPD (chronic obstructive pulmonary disease) (HCC) Patient wheezes at baseline. - Continue home Breztri  inhaler 2 puffs daily  - Continue DuoNebs q4hrs PRN - SCH + PRN - Benzonatate  200 mg TID PRN for cough Stage 5 chronic kidney disease not on chronic dialysis (HCC) Hgb stable at 7.0. - Transfusion threshold of <8 - Daily CBC Essential hypertension SBP < 130. Will continue to monitor given patient will not be able to receive dialysis until 1/23. - Cardiology consulted, appreciate recommendations - Continue Carvedilol  12.5 BID, Hydralazine  100 mg TID - Continue home Amlodipine  10 mg daily and Doxazosin  2 mg daily Chronic health problem T2DM: hold nighttime Lantus  on admission, moderate SSI HLD: continue home Pravastatin  40 mg daily CAD: Holding ASA in the setting of anemia requiring recurrent transfusions per cardiology Chronic SVT of L small saphenous vein - no chronic anticoagulatin per vascular  FEN/GI: Renal with fluid restriction, thin PPx: Heparin  Dispo:Pending PT recommendations  pending clinical improvement   Subjective:  Patient  reports she is doing well this morning.  Discussed ENT findings and ongoing recommendations.  No other questions at this time.  Objective: Temp:  [98.5 F  (36.9 C)-100.1 F (37.8 C)] 98.5 F (36.9 C) (01/22 0313) Pulse Rate:  [78-87] 87 (01/22 0410) Resp:  [15-22] 22 (01/22 0410) BP: (111-132)/(46-73) 132/60 (01/22 0313) SpO2:  [90 %-100 %] 95 % (01/22 0410) Weight:  [78.5 kg] 78.5 kg (01/22 0410)  Physical Exam: General: Alert, NAD Cardiovascular: RRR Pulmonary: Normal WOB. Mild wheezing present in upper quadrants Extremities: No edema Neurologic: No focal deficits  Laboratory: Most recent CBC Lab Results  Component Value Date   WBC 30.0 (H) 10/01/2024   HGB 7.0 (L) 10/01/2024   HCT 21.7 (L) 10/01/2024   MCV 81.9 10/01/2024   PLT 184 10/01/2024   Most recent BMP    Latest Ref Rng & Units 10/01/2024    3:21 AM  BMP  Glucose 70 - 99 mg/dL 897   BUN 8 - 23 mg/dL 71   Creatinine 9.55 - 1.00 mg/dL 2.07   Sodium 864 - 854 mmol/L 130   Potassium 3.5 - 5.1 mmol/L 3.8   Chloride 98 - 111 mmol/L 88   CO2 22 - 32 mmol/L 23   Calcium  8.9 - 10.3 mg/dL 9.0     Catherine Gathers, DO 10/01/2024, 8:00 AM  PGY-1 , [facility] FPTS Intern pager: [phone], text pages welcome Secure chat group [facility] Teaching Service   "

## 2024-10-01 NOTE — Progress Notes (Addendum)
 Patient called out requesting blue vomit bag. Patient has had at least two episodes of coughing up blood in a size of a 50 cent piece or a little larger.   Patient does not recall when this started. This is a change for this patient on my shift. Notified on call MD.  Was referred by MD to take a picture and measurement and upload to the media on the chart so that the day team can manage it and/or assess it.

## 2024-10-01 NOTE — Progress Notes (Signed)
 Speech Language Pathology Treatment: Dysphagia  Patient Details Name: Catherine Aguilar MRN: [id number] DOB: 1956-02-05 Today's Date: 10/01/2024 Time: 9060-9041 SLP Time Calculation (min) (ACUTE ONLY): 19 min  Assessment / Plan / Recommendation Clinical Impression  Pt has functional appearing oropharyngeal swallow across challenging that includes a three ounce water challenge. She does have dysphonia in the setting of vocal fold paralysis though and cannot tell me when this began. She also subjectively describes having intermittent dysphagia, stating that she has trouble daily but not able to elaborate about what these symptoms may entail. It could be reasonable to pursue MBS given vocal fold changes noted on ENT eval, concern for PNA on imaging, and pt report of symptoms.  PLAN: Recommend continuing with regular diet and thin liquids for now. Pt planned for other procedures tomorrow for which she will need to be NPO. Per discussion with DO, will plan to f/u early next week if still here for completion of MBS.    HPI HPI: Catherine Aguilar is a 69 y.o. F admitted from home for CHF exacerbation. Rapid response called due to increased WOB, placed on Bipap. MD note states rapid response RN suspected stridor are more consistent with vocal cord dysfunction. S/p laryngoscopy with ENT 1/21 that was notable for L vocal fold paralysis in the paramedian position as well as slight edema in the vocal folds/arytenoids and increased thickness of the L vocal fold.  CT chest showed airspace consolidation in the LLL and inferior LUL compatible with multifocal PNA as well as secretions in the LLL bronchus concerning for aspiration, mild emphysema. BSE in 2018 normal oropharyngeal function, reg/thin rec'd.  PMHx of T2DM, HTN, CKD stage V, anemia of chronic disease, COPD, Wegener's granulomatosis with renal involvement who is admitted for CHF exacerbation.      SLP Plan  MBS        Swallow Evaluation Recommendations    Recommendations: PO diet PO Diet Recommendation: Regular;Thin liquids (Level 0) Liquid Administration via: Cup;Straw Medication Administration: Whole meds with liquid Supervision: Patient able to self-feed;Intermittent supervision/cueing for swallowing strategies Postural changes: Position pt fully upright for meals Oral care recommendations: Oral care BID (2x/day)     Recommendations                           Dysphagia, unspecified (R13.10)     MBS     Leita SAILOR., M.A. CCC-SLP Acute Rehabilitation Services Office: [phone]  Secure chat preferred   10/01/2024, 11:13 AM

## 2024-10-01 NOTE — Plan of Care (Signed)
  Problem: Education: Goal: Knowledge of disease and its progression will improve Outcome: Not Progressing

## 2024-10-01 NOTE — Assessment & Plan Note (Signed)
 Hgb stable at 7.0. - Transfusion threshold of <8 - Daily CBC

## 2024-10-01 NOTE — Assessment & Plan Note (Addendum)
 Bcx with MRSA likely 2/2 temp HD line. Afebrile, leukocytosis improving. - ID consulted, appreciate recommendations - Continue vancomycin  per pharmacy (1/19 - ) - Continue PO Augmentin  250-125 mg q12hrs (1/20-1/24) for PNA - Line holiday x48hrs (1/21-1/23) - Bcx from 1/21 NG < 24hrs - Will need repeat Bcx 48hrs after line removal (1/23) to ensure MRSA clearance

## 2024-10-01 NOTE — Assessment & Plan Note (Addendum)
 SBP < 130. Will continue to monitor given patient will not be able to receive dialysis until 1/23. - Cardiology consulted, appreciate recommendations - Continue Carvedilol  12.5 BID, Hydralazine  100 mg TID - Continue home Amlodipine  10 mg daily and Doxazosin  2 mg daily

## 2024-10-01 NOTE — Assessment & Plan Note (Deleted)
 D-dimer elevated to 2.08 on admission. - No need for chronic anticoagulation per vascular surgery - Continue heparin  at VTE prophylaxis dose

## 2024-10-01 NOTE — Assessment & Plan Note (Signed)
 Cr 6.6 > 7.92, s/p dialysis x3. No documented output, weight 76.8 > 78.5. TDC and AVF/G surgery with VVS pending resolution of MRSA bacteremia. Line holiday until 1/23. - Nephrology consulted, appreciate recommendations. - Plan for new dialysis catheter to be placed tomorrow, will likely be temporary given ongoing leukocytosis - Next dialysis likely 1/23 - Tylenol  650 mg q6hrs PRN PO or rectal for pain - Avoid nephrotoxic agents - Strict I&Os, daily weights - Daily CBC, RFP

## 2024-10-01 NOTE — Progress Notes (Signed)
 "        [facility]  Date of Admission:  09/25/2024      Total days of antibiotics 4  Vancomycin  (renally dosed for ESRD)   Augmentin  1/20  Zosyn  x 2 doses     ASSESSMENT: Catherine Aguilar is a 69 y.o. female  Nosocomial MRSA Bacteremia 2/2 HD line -  HD Line Insertion 11/18, Unfortunately, developed high fever > 101 F within a short time of HD catheter placement and leukocytosis. Blood cultures drawn on 1/19 with early growth of MRSA.  When we first saw her she was confused. Today she is much improved. No findings concerning for metastatic infection spread. Suspect this is all HD line related. TTE 24h prior to fever and no vegetations or significant valvular abnormalcies noted. No other work up indicated at this time. Would treat 2 weeks vancomycin  with HD - Follow BCx drawn yesterday PM after line removed on 1/21 - Continue vancomycin   - TTE reviewed from 1/18 - no findings that would suggest she had endocarditis on hospital entry    ARF -  ?Asp PNA -  Volume over load favored with medication non-compliance, blood transfusion recently and progressive CKD stage 5 with new HD start.  Concern for radiographic evidence of aspiration on CT  Vocal cord paralysis consideration - noted and reviewed ENT's note  - Augmentin  through 1/24    ESRD -  Wegner's Granulomatosis -  Started HD after some hesitancy  Scheduled surgery for 1/23 for AVF creation - with still a leukocytosis in the 30K range and only 12 hrs of blood culture data we would favor holding off on this until next week if possible.     PLAN: - Follow BCx drawn yesterday PM after line removed on 1/21 - Continue vancomycin   - Augmentin  through 1/24  - Scheduled surgery for 1/23 for AVF creation - with still a leukocytosis in the 30K range and only 12 hrs of blood culture data we would favor holding off on this until next week if possible.     Principal Problem:   MRSA bacteremia Active  Problems:   Essential hypertension   Acute kidney injury superimposed on stage 5 chronic kidney disease, not on chronic dialysis (HCC)   Chronic kidney disease (CKD), stage IV (severe) (HCC)   DM2 (diabetes mellitus, type 2) (HCC)   COPD (chronic obstructive pulmonary disease) (HCC)   Stage 5 chronic kidney disease not on chronic dialysis (HCC)   Cardiorenal syndrome with renal failure   Volume overload   Chronic health problem   Elevated d-dimer   Elevated troponin   Demand ischemia (HCC)   Acute on chronic congestive heart failure (HCC)   Multifocal pneumonia   Suspected vocal cord dysfunction   Shortness of breath   Chronic superficial vein thrombosis involving L small saphenous vein   Bloodstream infection due to central venous catheter   ESRD on hemodialysis (HCC)    amLODipine   10 mg Oral Daily   amoxicillin -clavulanate  1 tablet Oral Q12H   budesonide -glycopyrrolate -formoterol   2 puff Inhalation Daily   calcium  acetate  667 mg Oral TID WC   carvedilol   12.5 mg Oral BID WC   Chlorhexidine  Gluconate Cloth  6 each Topical Daily   doxazosin   2 mg Oral Daily   heparin   5,000 Units Subcutaneous Q8H   hydrALAZINE   100 mg Oral Q8H   insulin  aspart  0-15 Units Subcutaneous TID WC   ipratropium-albuterol   3 mL Nebulization BID  mupirocin  ointment  1 Application Nasal BID   pravastatin   40 mg Oral QHS   vancomycin  variable dose per unstable renal function (pharmacist dosing)   Does not apply See admin instructions    SUBJECTIVE: Feeling better. Less confused. No new complaints today and denies any MSK pain. Catheter has been removed.   Review of Systems: Review of Systems  Constitutional:  Negative for chills and fever.  HENT:  Negative for tinnitus.   Eyes:  Negative for blurred vision and photophobia.  Respiratory:  Negative for cough and sputum production.   Cardiovascular:  Negative for chest pain.  Gastrointestinal:  Negative for diarrhea, nausea and vomiting.   Genitourinary:  Negative for dysuria.  Skin:  Negative for rash.  Neurological:  Negative for headaches.    Allergies[1]  OBJECTIVE: Vitals:   10/01/24 0410 10/01/24 0828 10/01/24 0841 10/01/24 1158  BP:   130/61 118/85  Pulse: 87 80 81 80  Resp: (!) 22 18 17 20   Temp:   99.2 F (37.3 C) 99.4 F (37.4 C)  TempSrc:   Oral Oral  SpO2: 95% 100% 96% 99%  Weight: 78.5 kg     Height:       Body mass index is 27.11 kg/m.  Physical Exam Constitutional:      Appearance: Normal appearance. She is not ill-appearing.  HENT:     Mouth/Throat:     Mouth: Mucous membranes are moist.     Pharynx: Oropharynx is clear.  Eyes:     General: No scleral icterus. Cardiovascular:     Rate and Rhythm: Normal rate and regular rhythm.     Heart sounds: No murmur heard. Pulmonary:     Effort: Pulmonary effort is normal. Tachypnea present.     Breath sounds: Decreased breath sounds present.     Comments: 2 LPM [place] Abdominal:     Palpations: Abdomen is soft.  Skin:    General: Skin is warm and dry.  Neurological:     Mental Status: She is oriented to person, place, and time.  Psychiatric:        Mood and Affect: Mood normal.        Thought Content: Thought content normal.     Lab Results Lab Results  Component Value Date   WBC 30.0 (H) 10/01/2024   HGB 7.0 (L) 10/01/2024   HCT 21.7 (L) 10/01/2024   MCV 81.9 10/01/2024   PLT 184 10/01/2024    Lab Results  Component Value Date   CREATININE 7.92 (H) 10/01/2024   BUN 71 (H) 10/01/2024   NA 130 (L) 10/01/2024   K 3.8 10/01/2024   CL 88 (L) 10/01/2024   CO2 23 10/01/2024    Lab Results  Component Value Date   ALT 50 (H) 09/25/2024   AST 27 09/25/2024   ALKPHOS 110 09/25/2024   BILITOT 0.4 09/25/2024     Microbiology: Recent Results (from the past 240 hours)  Resp panel by RT-PCR (RSV, Flu A&B, Covid) Anterior Nasal Swab     Status: None   Collection Time: 09/25/24  9:34 PM   Specimen: Anterior Nasal Swab  Result Value  Ref Range Status   SARS Coronavirus 2 by RT PCR NEGATIVE NEGATIVE Final   Influenza A by PCR NEGATIVE NEGATIVE Final   Influenza B by PCR NEGATIVE NEGATIVE Final    Comment: (NOTE) The Xpert Xpress SARS-CoV-2/FLU/RSV plus assay is intended as an aid in the diagnosis of influenza from Nasopharyngeal swab specimens and should not be used  as a sole basis for treatment. Nasal washings and aspirates are unacceptable for Xpert Xpress SARS-CoV-2/FLU/RSV testing.  Fact Sheet for Patients: bloggercourse.com  Fact Sheet for Healthcare Providers: seriousbroker.it  This test is not yet approved or cleared by the [place]  FDA and has been authorized for detection and/or diagnosis of SARS-CoV-2 by FDA under an Emergency Use Authorization (EUA). This EUA will remain in effect (meaning this test can be used) for the duration of the COVID-19 declaration under Section 564(b)(1) of the Act, 21 U.S.C. section 360bbb-3(b)(1), unless the authorization is terminated or revoked.     Resp Syncytial Virus by PCR NEGATIVE NEGATIVE Final    Comment: (NOTE) Fact Sheet for Patients: bloggercourse.com  Fact Sheet for Healthcare Providers: seriousbroker.it  This test is not yet approved or cleared by the [place]  FDA and has been authorized for detection and/or diagnosis of SARS-CoV-2 by FDA under an Emergency Use Authorization (EUA). This EUA will remain in effect (meaning this test can be used) for the duration of the COVID-19 declaration under Section 564(b)(1) of the Act, 21 U.S.C. section 360bbb-3(b)(1), unless the authorization is terminated or revoked.  Performed at [facility], [address], [place], [place] [address]   Respiratory (~20 pathogens) panel by PCR     Status: None   Collection Time: 09/28/24  3:40 PM   Specimen: Nasopharyngeal Swab; Respiratory  Result Value Ref  Range Status   Adenovirus NOT DETECTED NOT DETECTED Final   Coronavirus 229E NOT DETECTED NOT DETECTED Final    Comment: (NOTE) The Coronavirus on the Respiratory Panel, DOES NOT test for the novel  Coronavirus (2019 nCoV)    Coronavirus HKU1 NOT DETECTED NOT DETECTED Final   Coronavirus NL63 NOT DETECTED NOT DETECTED Final   Coronavirus OC43 NOT DETECTED NOT DETECTED Final   Metapneumovirus NOT DETECTED NOT DETECTED Final   Rhinovirus / Enterovirus NOT DETECTED NOT DETECTED Final   Influenza A NOT DETECTED NOT DETECTED Final   Influenza B NOT DETECTED NOT DETECTED Final   Parainfluenza Virus 1 NOT DETECTED NOT DETECTED Final   Parainfluenza Virus 2 NOT DETECTED NOT DETECTED Final   Parainfluenza Virus 3 NOT DETECTED NOT DETECTED Final   Parainfluenza Virus 4 NOT DETECTED NOT DETECTED Final   Respiratory Syncytial Virus NOT DETECTED NOT DETECTED Final   Bordetella pertussis NOT DETECTED NOT DETECTED Final   Bordetella Parapertussis NOT DETECTED NOT DETECTED Final   Chlamydophila pneumoniae NOT DETECTED NOT DETECTED Final   Mycoplasma pneumoniae NOT DETECTED NOT DETECTED Final    Comment: Performed at [facility], [address], [place], [place] [address]  Culture, blood (Routine X 2) w Reflex to ID Panel     Status: Abnormal   Collection Time: 09/28/24  7:00 PM   Specimen: BLOOD  Result Value Ref Range Status   Specimen Description BLOOD SITE NOT SPECIFIED  Final   Special Requests   Final    BOTTLES DRAWN AEROBIC AND ANAEROBIC Blood Culture adequate volume   Culture  Setup Time   Final    GRAM POSITIVE COCCI AEROBIC BOTTLE ONLY CRITICAL RESULT CALLED TO, READ BACK BY AND VERIFIED WITH: MAYA Damien RAMAN on 012026 @1425  by SM Performed at [facility], [address], [place], [place] [address]    Culture METHICILLIN RESISTANT STAPHYLOCOCCUS AUREUS (A)  Final   Report Status 10/01/2024 FINAL  Final   Organism ID, Bacteria METHICILLIN RESISTANT STAPHYLOCOCCUS AUREUS   Final      Susceptibility  Methicillin resistant staphylococcus aureus - MIC*    CIPROFLOXACIN >=8 RESISTANT Resistant     ERYTHROMYCIN >=8 RESISTANT Resistant     GENTAMICIN <=0.5 SENSITIVE Sensitive     OXACILLIN >=4 RESISTANT Resistant     TETRACYCLINE <=1 SENSITIVE Sensitive     VANCOMYCIN  <=0.5 SENSITIVE Sensitive     TRIMETH /SULFA  >=320 RESISTANT Resistant     CLINDAMYCIN >=8 RESISTANT Resistant     RIFAMPIN <=0.5 SENSITIVE Sensitive     Inducible Clindamycin NEGATIVE Sensitive     LINEZOLID 2 SENSITIVE Sensitive     * METHICILLIN RESISTANT STAPHYLOCOCCUS AUREUS  Blood Culture ID Panel (Reflexed)     Status: Abnormal   Collection Time: 09/28/24  7:00 PM  Result Value Ref Range Status   Enterococcus faecalis NOT DETECTED NOT DETECTED Final   Enterococcus Faecium NOT DETECTED NOT DETECTED Final   Listeria monocytogenes NOT DETECTED NOT DETECTED Final   Staphylococcus species DETECTED (A) NOT DETECTED Final    Comment: CRITICAL RESULT CALLED TO, READ BACK BY AND VERIFIED WITH: PHARMD Damien RAMAN on 012026 @1425  by SM    Staphylococcus aureus (BCID) DETECTED (A) NOT DETECTED Final    Comment: Methicillin (oxacillin)-resistant Staphylococcus aureus (MRSA). MRSA is predictably resistant to beta-lactam antibiotics (except ceftaroline). Preferred therapy is vancomycin  unless clinically contraindicated. Patient requires contact precautions if  hospitalized. CRITICAL RESULT CALLED TO, READ BACK BY AND VERIFIED WITH: PHARMD Damien RAMAN on 012026 @1425  by SM    Staphylococcus epidermidis NOT DETECTED NOT DETECTED Final   Staphylococcus lugdunensis NOT DETECTED NOT DETECTED Final   Streptococcus species NOT DETECTED NOT DETECTED Final   Streptococcus agalactiae NOT DETECTED NOT DETECTED Final   Streptococcus pneumoniae NOT DETECTED NOT DETECTED Final   Streptococcus pyogenes NOT DETECTED NOT DETECTED Final   A.calcoaceticus-baumannii NOT DETECTED NOT DETECTED Final   Bacteroides fragilis NOT  DETECTED NOT DETECTED Final   Enterobacterales NOT DETECTED NOT DETECTED Final   Enterobacter cloacae complex NOT DETECTED NOT DETECTED Final   Escherichia coli NOT DETECTED NOT DETECTED Final   Klebsiella aerogenes NOT DETECTED NOT DETECTED Final   Klebsiella oxytoca NOT DETECTED NOT DETECTED Final   Klebsiella pneumoniae NOT DETECTED NOT DETECTED Final   Proteus species NOT DETECTED NOT DETECTED Final   Salmonella species NOT DETECTED NOT DETECTED Final   Serratia marcescens NOT DETECTED NOT DETECTED Final   Haemophilus influenzae NOT DETECTED NOT DETECTED Final   Neisseria meningitidis NOT DETECTED NOT DETECTED Final   Pseudomonas aeruginosa NOT DETECTED NOT DETECTED Final   Stenotrophomonas maltophilia NOT DETECTED NOT DETECTED Final   Candida albicans NOT DETECTED NOT DETECTED Final   Candida auris NOT DETECTED NOT DETECTED Final   Candida glabrata NOT DETECTED NOT DETECTED Final   Candida krusei NOT DETECTED NOT DETECTED Final   Candida parapsilosis NOT DETECTED NOT DETECTED Final   Candida tropicalis NOT DETECTED NOT DETECTED Final   Cryptococcus neoformans/gattii NOT DETECTED NOT DETECTED Final   Meth resistant mecA/C and MREJ DETECTED (A) NOT DETECTED Final    Comment: CRITICAL RESULT CALLED TO, READ BACK BY AND VERIFIED WITH: MAYA Damien RAMAN on 012026 @1425  by SM Performed at [facility], [address] [place], [place] [address]   MIC (1 Drug)-     Status: None (Preliminary result)   Collection Time: 09/28/24  7:00 PM  Result Value Ref Range Status   Min Inhibitory Conc (1 Drug) PENDING  Incomplete   Source LAB 10644 MRSA BLOOD DAPTOMYCIN  Final    Comment: Performed at  [facility], [address], [place], [place] [address]  Culture, blood (Routine X 2) w Reflex to ID Panel     Status: Abnormal   Collection Time: 09/28/24  7:02 PM   Specimen: BLOOD  Result Value Ref Range Status   Specimen Description BLOOD SITE NOT SPECIFIED  Final   Special Requests    Final    BOTTLES DRAWN AEROBIC AND ANAEROBIC Blood Culture adequate volume   Culture  Setup Time   Final    GRAM POSITIVE COCCI IN CLUSTERS ANAEROBIC BOTTLE ONLY CRITICAL VALUE NOTED.  VALUE IS CONSISTENT WITH PREVIOUSLY REPORTED AND CALLED VALUE.    Culture (A)  Final    STAPHYLOCOCCUS AUREUS SUSCEPTIBILITIES PERFORMED ON PREVIOUS CULTURE WITHIN THE LAST 5 DAYS. Performed at [facility], [address] [place], [place] [address]    Report Status 10/01/2024 FINAL  Final  MRSA Next Gen by PCR, Nasal     Status: Abnormal   Collection Time: 09/29/24  7:13 AM   Specimen: Nasal Mucosa; Nasal Swab  Result Value Ref Range Status   MRSA by PCR Next Gen DETECTED (A) NOT DETECTED Final    Comment: RESULT CALLED TO, READ BACK BY AND VERIFIED WITH: RN CHRISTELLA HALEY [id number] AT 1136 BY CM (NOTE) The GeneXpert MRSA Assay (FDA approved for NASAL specimens only), is one component of a comprehensive MRSA colonization surveillance program. It is not intended to diagnose MRSA infection nor to guide or monitor treatment for MRSA infections. Test performance is not FDA approved in patients less than 66 years old. Performed at [facility], [address], [place], [place] [address]   Culture, blood (Routine X 2) w Reflex to ID Panel     Status: None (Preliminary result)   Collection Time: 09/30/24  7:16 AM   Specimen: BLOOD RIGHT ARM  Result Value Ref Range Status   Specimen Description BLOOD RIGHT ARM  Final   Special Requests   Final    BOTTLES DRAWN AEROBIC AND ANAEROBIC Blood Culture adequate volume   Culture   Final    NO GROWTH < 24 HOURS Performed at [facility], [address], [place], [place] [address]    Report Status PENDING  Incomplete  Culture, blood (Routine X 2) w Reflex to ID Panel     Status: None (Preliminary result)   Collection Time: 09/30/24  7:25 AM   Specimen: BLOOD RIGHT ARM  Result Value Ref Range Status   Specimen Description BLOOD RIGHT ARM  Final    Special Requests   Final    BOTTLES DRAWN AEROBIC AND ANAEROBIC Blood Culture adequate volume   Culture   Final    NO GROWTH < 24 HOURS Performed at [facility], [address], [place], [place] [address]    Report Status PENDING  Incomplete  Culture, blood (Routine X 2) w Reflex to ID Panel     Status: None (Preliminary result)   Collection Time: 09/30/24 12:12 PM   Specimen: BLOOD RIGHT HAND  Result Value Ref Range Status   Specimen Description BLOOD RIGHT HAND  Final   Special Requests   Final    BOTTLES DRAWN AEROBIC AND ANAEROBIC Blood Culture adequate volume   Culture   Final    NO GROWTH < 24 HOURS Performed at [facility], [address], [place], [place] [address]    Report Status PENDING  Incomplete  Culture, blood (Routine X 2) w Reflex to  ID Panel     Status: None (Preliminary result)   Collection Time: 09/30/24 12:14 PM   Specimen: BLOOD LEFT HAND  Result Value Ref Range Status   Specimen Description BLOOD LEFT HAND  Final   Special Requests   Final    BOTTLES DRAWN AEROBIC AND ANAEROBIC Blood Culture adequate volume   Culture   Final    NO GROWTH < 24 HOURS Performed at [facility], [address] [place], [place] [address]    Report Status PENDING  Incomplete    Corean Fireman, MSN, NP-C [facility]  [email or web address] Pager: [phone] Office: [phone] [facility] Main Line: [phone] *Secure Chat Communication Welcome  Total Encounter Time: 12 min     [1] No Known Allergies  "

## 2024-10-01 NOTE — Assessment & Plan Note (Signed)
" >>  ASSESSMENT AND PLAN FOR SUSPECTED VOCAL CORD DYSFUNCTION WRITTEN ON 10/01/2024  2:55 PM BY Sadey Yandell, DO  L vocal cord paralysis seen on fiberoptic scope which is likely contributing to SOB. - ENT consulted, appreciate recommendations - Recommend CT neck with contrast for evaluation of vagus nerve tract - If condition worsens, may require intubation or even tracheostomy w/ outpt management.  - Appropriate to wait for CT neck with contrast until patient is able to resume dialysis; this was discussed with both nephrology and ENT. - Hydroxyzine  10mg  TID PRN "

## 2024-10-01 NOTE — Progress Notes (Signed)
" °   10/01/24 1557  TOC Brief Assessment  Insurance and Status Reviewed  Patient has primary care physician Yes  Home environment has been reviewed home  Prior level of function: self  Prior/Current Home Services No current home services  Social Drivers of Health Review SDOH reviewed no interventions necessary  Readmission risk has been reviewed Yes  Transition of care needs transition of care needs identified, TOC will continue to follow    Pt on iHD- new- per renal navigator- referral for out-pt HD at d/c has been submitted. Pt currently confused. Inpatient Care Management (ICM) has reviewed patient and will continue to monitor patient advancement through interdisciplinary progression rounds. If new patient transition needs arise, please place a ICM (CM/CSW) consult. "

## 2024-10-01 NOTE — Progress Notes (Signed)
 Patient ID: Catherine Aguilar, female   DOB: 04/28/1956, 69 y.o.   MRN: [id number] [facility] Progress Note   Assessment/ Plan:   1.  End-stage renal disease: From progressive underlying chronic kidney disease secondary to hypertension, type 2 diabetes mellitus and distant history of ANCA vasculitis.  Started on hemodialysis on 09/26/2024 via temporary right dialysis catheter dialysis catheter and had her third dialysis treatment on 1/20.  Temporary dialysis catheter taken out yesterday to facilitate line holiday in the setting of staphylococcal bacteremia with repeat cultures from yesterday that are negative to date.  Anticipate placement of a new dialysis catheter tomorrow or Saturday (based on the presence of her leukocytosis, may need to again be a temporary dialysis catheter unless there is confidence that her negative cultures are reassuring). 2.  Dyspnea: Overnight with improvement of shortness of breath that appears to be multifactorial including from volume (removed with dialysis).  Seen by ENT yesterday with fiberoptic laryngoscopy showing left vocal cord paralysis in paramedian position (intubation versus tracheostomy recommended for future problems). 3.  Hypertension: Blood pressure appears to be currently under decent control with ongoing therapies and UF on HD. 4.  Anemia: Likely secondary to chronic illness, status post ESA as an outpatient on 09/23/2024.  No overt blood loss noted. 5.  Secondary hyperparathyroidism: Calcium  level marginally low when corrected for albumin.  Elevated phosphorus level, will start phosphorus binder.  Subjective:   Reports improvement of her breathing overnight..  Episodes of anxiety noted.  Seen yesterday by ENT surgery with fiberoptic laryngoscopy showing left vocal cord paralysis in paramedian position.   Objective:   BP 132/60 (BP Location: Right Arm)   Pulse 87   Temp 98.5 F (36.9 C) (Oral)   Resp (!) 22   Ht 5' 7 (1.702 m)   Wt 78.5  kg   SpO2 95%   BMI 27.11 kg/m   Intake/Output Summary (Last 24 hours) at 10/01/2024 9191 Last data filed at 09/30/2024 1100 Gross per 24 hour  Intake 240 ml  Output --  Net 240 ml   Weight change: -2.2 kg  Physical Exam: Gen: Comfortably resting in bed, on oxygen via nasal cannula. CVS: Pulse regular rhythm, normal rate, S1 and S2 normal.   Resp: Fine rales right base otherwise distant breath sounds Abd: Soft, obese, nontender, bowel sounds normal Ext: Trace lower extremity edema  Imaging: VAS US  LOWER EXTREMITY VENOUS (DVT) Result Date: 09/29/2024  Lower Venous DVT Study Patient Name:  Catherine Aguilar  Date of Exam:   09/29/2024 Medical Rec #: [id number]        Accession #:    [id number] Date of Birth: 1955/11/26        Patient Gender: F Patient Age:   69 years Exam Location:  [facility] Procedure:      VAS US  LOWER EXTREMITY VENOUS (DVT) Referring Phys: LAMAR PATERSON --------------------------------------------------------------------------------  Indications: Edema, and SOB. Other Indications: Prior CVA. Limitations: Limited exam due to patient's uncooperativeness. Comparison Study: Previous study of the right lower extremity on 7.18.2018. Performing Technologist: Edilia Elden Appl  Examination Guidelines: A complete evaluation includes B-mode imaging, spectral Doppler, color Doppler, and power Doppler as needed of all accessible portions of each vessel. Bilateral testing is considered an integral part of a complete examination. Limited examinations for reoccurring indications may be performed as noted. The reflux portion of the exam is performed with the patient in reverse Trendelenburg.  +---------+---------------+---------+-----------+----------+--------------+ RIGHT    CompressibilityPhasicitySpontaneityPropertiesThrombus Aging +---------+---------------+---------+-----------+----------+--------------+ CFV  Full           No       Yes                                  +---------+---------------+---------+-----------+----------+--------------+ SFJ      Full           No       Yes                                 +---------+---------------+---------+-----------+----------+--------------+ FV Prox  Full                                                        +---------+---------------+---------+-----------+----------+--------------+ FV Mid   Full                                                        +---------+---------------+---------+-----------+----------+--------------+ FV DistalFull                                                        +---------+---------------+---------+-----------+----------+--------------+ PFV      Full                                                        +---------+---------------+---------+-----------+----------+--------------+ POP      Full           No       Yes                                 +---------+---------------+---------+-----------+----------+--------------+ PTV      Full                                                        +---------+---------------+---------+-----------+----------+--------------+ PERO     Full                                                        +---------+---------------+---------+-----------+----------+--------------+   +---------+---------------+---------+-----------+----------+--------------+ LEFT     CompressibilityPhasicitySpontaneityPropertiesThrombus Aging +---------+---------------+---------+-----------+----------+--------------+ CFV      Full           No       Yes                                 +---------+---------------+---------+-----------+----------+--------------+  SFJ      Full           No       Yes                                 +---------+---------------+---------+-----------+----------+--------------+ FV Prox  Full                                                         +---------+---------------+---------+-----------+----------+--------------+ FV Mid   Full                                                        +---------+---------------+---------+-----------+----------+--------------+ FV DistalFull                                                        +---------+---------------+---------+-----------+----------+--------------+ PFV      Full                                                        +---------+---------------+---------+-----------+----------+--------------+ POP      Full           No       Yes                                 +---------+---------------+---------+-----------+----------+--------------+ PTV      Full                                                        +---------+---------------+---------+-----------+----------+--------------+ PERO     Full                                                        +---------+---------------+---------+-----------+----------+--------------+ SSV      Full           No       Yes                  Chronic        +---------+---------------+---------+-----------+----------+--------------+ Chronic calcified thrombus noted in the left small saphenous vein.    Summary: RIGHT: - There is no evidence of deep vein thrombosis in the lower extremity.  - No cystic structure found in the popliteal fossa.  LEFT: - Findings consistent with chronic superficial vein thrombosis involving the left small saphenous vein.  - No cystic structure found in the popliteal fossa.  *  See table(s) above for measurements and observations. Electronically signed by Gaile New MD on 09/29/2024 at 12:45:02 PM.    Final    VAS US  UPPER EXT VEIN MAPPING (PRE-OP  AVF) Result Date: 09/29/2024 UPPER EXTREMITY VEIN MAPPING Patient Name:  EDLA PARA  Date of Exam:   09/29/2024 Medical Rec #: [id number]        Accession #:    [id number] Date of Birth: 1956-05-31        Patient Gender: F Patient Age:   44 years Exam  Location:  [facility] Procedure:      VAS US  UPPER EXT VEIN MAPPING (PRE-OP  AVF) Referring Phys: GAILE NEW --------------------------------------------------------------------------------  Indications: Pre-access. History: ESRD, volume overload.  Limitations: Limited exam due to patient's uncooperativeness, refused to              continue with exam. Comparison Study: No prior exam. Performing Technologist: Edilia Elden Appl  Examination Guidelines: A complete evaluation includes B-mode imaging, spectral Doppler, color Doppler, and power Doppler as needed of all accessible portions of each vessel. Bilateral testing is considered an integral part of a complete examination. Limited examinations for reoccurring indications may be performed as noted. +-----------------+-------------+----------+--------------+ Right Cephalic   Diameter (cm)Depth (cm)   Findings    +-----------------+-------------+----------+--------------+ Shoulder             0.17        0.80                  +-----------------+-------------+----------+--------------+ Prox upper arm                          not visualized +-----------------+-------------+----------+--------------+ Mid upper arm                           not visualized +-----------------+-------------+----------+--------------+ Dist upper arm                          not visualized +-----------------+-------------+----------+--------------+ Antecubital fossa    0.45        0.55      Thrombus    +-----------------+-------------+----------+--------------+ Prox forearm         0.49        0.43      Thrombus    +-----------------+-------------+----------+--------------+ Mid forearm          0.51        0.36                  +-----------------+-------------+----------+--------------+ Dist forearm         0.19        0.11                  +-----------------+-------------+----------+--------------+ Wrist                0.15         0.13                  +-----------------+-------------+----------+--------------+ +-----------------+-------------+----------+---------+ Right Basilic    Diameter (cm)Depth (cm)Findings  +-----------------+-------------+----------+---------+ Mid upper arm        0.53               branching +-----------------+-------------+----------+---------+ Dist upper arm       0.47               branching +-----------------+-------------+----------+---------+ Antecubital fossa    0.35                         +-----------------+-------------+----------+---------+  Prox forearm         0.17                         +-----------------+-------------+----------+---------+ Mid forearm          0.17                         +-----------------+-------------+----------+---------+ Distal forearm       0.19                         +-----------------+-------------+----------+---------+ Elbow                0.29                         +-----------------+-------------+----------+---------+ Wrist                0.16                         +-----------------+-------------+----------+---------+ +-----------------+-------------+----------+--------------+ Left Cephalic    Diameter (cm)Depth (cm)   Findings    +-----------------+-------------+----------+--------------+ Shoulder             0.21        2.00                  +-----------------+-------------+----------+--------------+ Prox upper arm       0.12        0.73                  +-----------------+-------------+----------+--------------+ Mid upper arm                           not visualized +-----------------+-------------+----------+--------------+ Dist upper arm                          not visualized +-----------------+-------------+----------+--------------+ Antecubital fossa    0.69        0.35                  +-----------------+-------------+----------+--------------+ Prox forearm         0.48        0.16                   +-----------------+-------------+----------+--------------+ Mid forearm          0.31        0.20                  +-----------------+-------------+----------+--------------+ Dist forearm         0.27        0.14                  +-----------------+-------------+----------+--------------+ Wrist                0.16        0.16                  +-----------------+-------------+----------+--------------+ +--------------+-------------+----------+--------+ Left Basilic  Diameter (cm)Depth (cm)Findings +--------------+-------------+----------+--------+ Mid upper arm     0.80                        +--------------+-------------+----------+--------+ Dist upper arm    0.46                        +--------------+-------------+----------+--------+  Patient refused to continue the exam, left upper extremity is limited. *See table(s) above for measurements and observations.  Diagnosing physician: Gaile New MD Electronically signed by Gaile New MD on 09/29/2024 at 12:44:30 PM.    Final     Labs: BMET Recent Labs  Lab 09/27/24 0401 09/28/24 0124 09/28/24 1323 09/29/24 1107 09/30/24 0716 09/30/24 0725 10/01/24 0321  NA 135 133* 133* 131* 134* 134* 130*  K 3.1* 3.6 3.5 4.0 3.9 3.9 3.8  CL 95* 92* 93* 90* 93* 92* 88*  CO2 20* 22 24 21* 24 24 23   GLUCOSE 112* 105* 112* 105* 96 96 102*  BUN 110* 117* 69* 85* 56* 57* 71*  CREATININE 10.40* 11.30* 7.34* 8.79* 6.71* 6.60* 7.92*  CALCIUM  8.4* 7.5* 8.1* 8.1* 9.1 9.1 9.0  PHOS 5.8* 7.4*  --  7.2*  --  6.6* 7.4*   CBC Recent Labs  Lab 09/25/24 2134 09/26/24 0343 09/28/24 0124 09/29/24 1107 09/30/24 0716 10/01/24 0321  WBC 12.0*   < > 16.1* 36.8* 32.1* 30.0*  NEUTROABS 9.3*  --   --   --   --   --   HGB 8.0*   < > 7.3* 7.6* 7.3* 7.0*  HCT 24.8*   < > 22.8* 23.3* 22.6* 21.7*  MCV 80.8   < > 82.6 80.6 81.3 81.9  PLT 151   < > 170 166 188 184   < > = values in this interval not displayed.     Medications:     amLODipine   10 mg Oral Daily   amoxicillin -clavulanate  1 tablet Oral Q12H   budesonide -glycopyrrolate -formoterol   2 puff Inhalation Daily   calcium  acetate  667 mg Oral TID WC   carvedilol   12.5 mg Oral BID WC   Chlorhexidine  Gluconate Cloth  6 each Topical Daily   doxazosin   2 mg Oral Daily   heparin   5,000 Units Subcutaneous Q8H   hydrALAZINE   100 mg Oral Q8H   insulin  aspart  0-15 Units Subcutaneous TID WC   ipratropium-albuterol   3 mL Nebulization Q6H   mupirocin  ointment  1 Application Nasal BID   pravastatin   40 mg Oral QHS   vancomycin  variable dose per unstable renal function (pharmacist dosing)   Does not apply See admin instructions   Gordy Blanch, MD 10/01/2024, 8:08 AM

## 2024-10-01 NOTE — Assessment & Plan Note (Signed)
 Patient wheezes at baseline. - Continue home Breztri  inhaler 2 puffs daily  - Continue DuoNebs q4hrs PRN - SCH + PRN - Benzonatate  200 mg TID PRN for cough

## 2024-10-02 ENCOUNTER — Encounter (HOSPITAL_COMMUNITY): Admission: EM | Disposition: A | Payer: Self-pay | Source: Home / Self Care | Attending: Family Medicine

## 2024-10-02 DIAGNOSIS — B9562 Methicillin resistant Staphylococcus aureus infection as the cause of diseases classified elsewhere: Secondary | ICD-10-CM | POA: Diagnosis not present

## 2024-10-02 DIAGNOSIS — D72829 Elevated white blood cell count, unspecified: Secondary | ICD-10-CM | POA: Diagnosis not present

## 2024-10-02 DIAGNOSIS — D649 Anemia, unspecified: Secondary | ICD-10-CM | POA: Diagnosis not present

## 2024-10-02 DIAGNOSIS — R7881 Bacteremia: Secondary | ICD-10-CM | POA: Diagnosis not present

## 2024-10-02 LAB — CBC WITH DIFFERENTIAL/PLATELET
Abs Immature Granulocytes: 0.25 K/uL — ABNORMAL HIGH (ref 0.00–0.07)
Basophils Absolute: 0.1 K/uL (ref 0.0–0.1)
Basophils Relative: 0 %
Eosinophils Absolute: 0.2 K/uL (ref 0.0–0.5)
Eosinophils Relative: 1 %
HCT: 23 % — ABNORMAL LOW (ref 36.0–46.0)
Hemoglobin: 7.7 g/dL — ABNORMAL LOW (ref 12.0–15.0)
Immature Granulocytes: 1 %
Lymphocytes Relative: 5 %
Lymphs Abs: 1 K/uL (ref 0.7–4.0)
MCH: 26.8 pg (ref 26.0–34.0)
MCHC: 33.5 g/dL (ref 30.0–36.0)
MCV: 80.1 fL (ref 80.0–100.0)
Monocytes Absolute: 1.9 K/uL — ABNORMAL HIGH (ref 0.1–1.0)
Monocytes Relative: 10 %
Neutro Abs: 16.3 K/uL — ABNORMAL HIGH (ref 1.7–7.7)
Neutrophils Relative %: 83 %
Platelets: 205 K/uL (ref 150–400)
RBC: 2.87 MIL/uL — ABNORMAL LOW (ref 3.87–5.11)
RDW: 16.5 % — ABNORMAL HIGH (ref 11.5–15.5)
WBC: 19.7 K/uL — ABNORMAL HIGH (ref 4.0–10.5)
nRBC: 0.2 % (ref 0.0–0.2)

## 2024-10-02 LAB — CBC
HCT: 20.7 % — ABNORMAL LOW (ref 36.0–46.0)
Hemoglobin: 6.6 g/dL — CL (ref 12.0–15.0)
MCH: 26 pg (ref 26.0–34.0)
MCHC: 31.9 g/dL (ref 30.0–36.0)
MCV: 81.5 fL (ref 80.0–100.0)
Platelets: 187 K/uL (ref 150–400)
RBC: 2.54 MIL/uL — ABNORMAL LOW (ref 3.87–5.11)
RDW: 17.3 % — ABNORMAL HIGH (ref 11.5–15.5)
WBC: 23.2 K/uL — ABNORMAL HIGH (ref 4.0–10.5)
nRBC: 0.1 % (ref 0.0–0.2)

## 2024-10-02 LAB — VANCOMYCIN, RANDOM: Vancomycin Rm: 17 ug/mL

## 2024-10-02 LAB — RENAL FUNCTION PANEL
Albumin: 3.2 g/dL — ABNORMAL LOW (ref 3.5–5.0)
Anion gap: 19 — ABNORMAL HIGH (ref 5–15)
BUN: 82 mg/dL — ABNORMAL HIGH (ref 8–23)
CO2: 22 mmol/L (ref 22–32)
Calcium: 8.7 mg/dL — ABNORMAL LOW (ref 8.9–10.3)
Chloride: 88 mmol/L — ABNORMAL LOW (ref 98–111)
Creatinine, Ser: 9.04 mg/dL — ABNORMAL HIGH (ref 0.44–1.00)
GFR, Estimated: 4 mL/min — ABNORMAL LOW
Glucose, Bld: 96 mg/dL (ref 70–99)
Phosphorus: 7.2 mg/dL — ABNORMAL HIGH (ref 2.5–4.6)
Potassium: 3.7 mmol/L (ref 3.5–5.1)
Sodium: 129 mmol/L — ABNORMAL LOW (ref 135–145)

## 2024-10-02 LAB — GLUCOSE, CAPILLARY
Glucose-Capillary: 103 mg/dL — ABNORMAL HIGH (ref 70–99)
Glucose-Capillary: 113 mg/dL — ABNORMAL HIGH (ref 70–99)
Glucose-Capillary: 121 mg/dL — ABNORMAL HIGH (ref 70–99)
Glucose-Capillary: 102 mg/dL — ABNORMAL HIGH (ref 70–99)

## 2024-10-02 LAB — PREPARE RBC (CROSSMATCH)

## 2024-10-02 MED ORDER — VANCOMYCIN HCL 750 MG/150ML IV SOLN
750.0000 mg | Freq: Once | INTRAVENOUS | Status: AC
  Administered 2024-10-02: 750 mg via INTRAVENOUS
  Filled 2024-10-02: qty 150

## 2024-10-02 MED ORDER — SODIUM CHLORIDE 0.9% IV SOLUTION: Freq: Once | INTRAVENOUS | Status: AC

## 2024-10-02 NOTE — Assessment & Plan Note (Addendum)
 Cr 7.92 > 9.04, s/p dialysis x3. No documented output, weight 78.5 > 79.8. TDC and AVF/G surgery with VVS pending resolution of MRSA bacteremia. Line holiday until 1/23. - Nephrology consulted, appreciate recommendations. - Plan for TDC/AVG likely next week once leukocytosis improves - No indication for dialysis at this time, nephro to monitor to determine need for dialysis over the weekend, will place temp cath if necessary - Tylenol  650 mg q6hrs PRN PO or rectal for pain - Avoid nephrotoxic agents - Strict I&Os, daily weights - Daily CBC, RFP

## 2024-10-02 NOTE — Assessment & Plan Note (Addendum)
 Wheezing present at baseline. - Continue home Breztri  inhaler 2 puffs daily  - Continue DuoNebs q4hrs PRN - SCH + PRN - Benzonatate  200 mg TID PRN for cough

## 2024-10-02 NOTE — Assessment & Plan Note (Addendum)
 Stable, continue monitoring given patient will likely not receive dialysis until next week - Cardiology consulted, appreciate recommendations - Continue Carvedilol  12.5 BID, Hydralazine  100 mg TID - Continue home Amlodipine  10 mg daily and Doxazosin  2 mg daily

## 2024-10-02 NOTE — Progress Notes (Signed)
 Pt has been accepted at [facility] on TTS 11:30 am chair time. Pt will need to arrive at 10:45 am for first appt to complete paperwork prior to treatment. Pt's start date will depend on pt's d/c date. Met with pt at bedside. Introduced self and explained role. Discuss above HD arrangements. Pt agreeable and schedule letter provided. Pt requested that navigator text pt a reminder of appt info closer to pt's d/c. Navigator agreeable to do this. HD info placed on AVS as well. Pt states she uses transportation services through her insurance for transportation to MD appts. Pt states she should be able to use that service for transportation to HD appts at d/c. Will assist as needed.   Randine Mungo  Dialysis Navigator  [phone]

## 2024-10-02 NOTE — Progress Notes (Signed)
" ° ° ° ° °  Chart, labs reviewed  Patients blood cultures post catheter removal have been NGTD 4 sites  Dr. Dennise is available for questions and will followup the culture data.  Dr. Dea will take over the ID service on Monday  At present most critical event has been removal of her HD catheter  Despite it being fairly rapid in onset, I do think her bacteremia needs to be further worked up with TEE when safe to proceed with one.  She did NOT have any exam findings or complaints when we last saw her to suggest any metastatic sites of infection.  Catherine Aguilar 10/02/2024, 9:36 PM  "

## 2024-10-02 NOTE — Assessment & Plan Note (Deleted)
 Bcx with MRSA likely 2/2 temp HD line. Afebrile, leukocytosis improving. - ID consulted, appreciate recommendations - Continue vancomycin  per pharmacy (1/19 - ) - Continue PO Augmentin  250-125 mg q12hrs (1/20-1/24) for PNA - Recommend TEE given persistent leukocytosis, likely 1/26 - Line holiday x48hrs (1/21-1/23) - Bcx from 1/21 NG @ 2 days - Repeat Bcx today to ensure MRSA clearance

## 2024-10-02 NOTE — Progress Notes (Signed)
 "    Daily Progress Note Intern Pager: [phone]  Patient name: Catherine Aguilar Medical record number: [id number] Date of birth: [birth year] Age: 69 y.o. Gender: female  Primary Care Provider: Adele Song, MD Consultants: Nephrology, cardiology, VVS, ENT full code Code Status: Full code  Pt Overview and Major Events to Date:  1/17 - admitted, temp HD catheter placed 1/18 - initiated dialysis 1/19 - dialysis; rapid response called x2 due to SOB and increased WOB 1/20 - dialysis; rapid response called overnight for inc WOB 1/21 - temp HD cath removed 2/2 MRSA bacteremia  Assessment and Plan: Catherine Aguilar is a 69 y.o. F with PMHx of T2DM, HTN, CKD stage V, anemia of chronic disease, COPD, Wegener's granulomatosis with renal involvement who was admitted for CHF exacerbation likely 2/2 volume overload from CKD5. Temp cath placed 1/17 and diuresing well but now with MRSA bacteremia likely from HD cath. Line holiday completed today and repeating Bcx today. Holding off on permanent TDC/AVG placement until next week given persistent leukocytosis. Nephrology will dialyze as necessary. ENT following for suspected VCD.  Assessment & Plan MRSA bacteremia Aspiration pneumonia of both lungs (HCC) Bcx with MRSA likely 2/2 temp HD line. Afebrile, leukocytosis improving. - ID consulted, appreciate recommendations - Continue vancomycin  per pharmacy (1/19 - ) - Continue PO Augmentin  250-125 mg q12hrs (1/20-1/24) for PNA - Recommend TEE given persistent leukocytosis, likely 1/26 - Line holiday x48hrs (1/21-1/23) - Bcx from 1/21 NG @ 2 days - Repeat Bcx today to ensure MRSA clearance Suspected vocal cord dysfunction Shortness of breath L vocal cord paralysis seen on fiberoptic scope which is likely contributing to SOB. - ENT consulted, appreciate recommendations - CT neck with contrast once patient is able to resume dialysis - Considier intubation or tracheostomy if condition worsens - Hydroxyzine   10mg  TID PRN Acute kidney injury superimposed on stage 5 chronic kidney disease, not on chronic dialysis (HCC) Volume overload Acute on chronic congestive heart failure (HCC) ESRD on hemodialysis (HCC) Cr 7.92 > 9.04, s/p dialysis x3. No documented output, weight 78.5 > 79.8. TDC and AVF/G surgery with VVS pending resolution of MRSA bacteremia. Line holiday until 1/23. - Nephrology consulted, appreciate recommendations. - Plan for TDC/AVG likely next week once leukocytosis improves - No indication for dialysis at this time, nephro to monitor to determine need for dialysis over the weekend, will place temp cath if necessary - Tylenol  650 mg q6hrs PRN PO or rectal for pain - Avoid nephrotoxic agents - Strict I&Os, daily weights - Daily CBC, RFP COPD (chronic obstructive pulmonary disease) (HCC) Wheezing present at baseline. - Continue home Breztri  inhaler 2 puffs daily  - Continue DuoNebs q4hrs PRN - SCH + PRN - Benzonatate  200 mg TID PRN for cough Stage 5 chronic kidney disease not on chronic dialysis (HCC) Hgb 6.6. Denies bleeding. Likely from CKD. - 1u PRBCs - Post--transfusion CBC with diff (prefer this over H/H given drop in Hgb) - Nephrology to redose ESA - Transfusion threshold of <8 - Daily CBC Essential hypertension Stable, continue monitoring given patient will likely not receive dialysis until next week - Cardiology consulted, appreciate recommendations - Continue Carvedilol  12.5 BID, Hydralazine  100 mg TID - Continue home Amlodipine  10 mg daily and Doxazosin  2 mg daily Chronic health problem T2DM: hold nighttime Lantus  on admission, moderate SSI HLD: continue home Pravastatin  40 mg daily CAD: Holding ASA in the setting of anemia requiring recurrent transfusions per cardiology Chronic SVT of L small saphenous vein - no chronic anticoagulatin per vascular  FEN/GI: Renal space with fluid restriction, thin PPx: Heparin  Dispo:Pending PT recommendations  pending clinical  improvement   Subjective:  Patient sitting up in bed this morning.  Says that she has not had her blood transfusion yet.  Otherwise no complaints.  Objective: Temp:  [97.9 F (36.6 C)-99.4 F (37.4 C)] 98.6 F (37 C) (01/23 0503) Pulse Rate:  [80-84] 83 (01/23 0503) Resp:  [17-22] 22 (01/23 0503) BP: (116-133)/(54-85) 133/59 (01/23 0503) SpO2:  [86 %-100 %] 86 % (01/23 0503) Weight:  [79.8 kg] 79.8 kg (01/23 0503)  Physical Exam: General: NAD  Cardiovascular: RRR, systolic murmur Pulmonary: Normal WOB. Mild wheezing in all quadrants Extremities: Warm, no edema  Laboratory: Most recent CBC Lab Results  Component Value Date   WBC 23.2 (H) 10/02/2024   HGB 6.6 (LL) 10/02/2024   HCT 20.7 (L) 10/02/2024   MCV 81.5 10/02/2024   PLT 187 10/02/2024   Most recent BMP    Latest Ref Rng & Units 10/02/2024    4:22 AM  BMP  Glucose 70 - 99 mg/dL 96   BUN 8 - 23 mg/dL 82   Creatinine 9.55 - 1.00 mg/dL 0.95   Sodium 864 - 854 mmol/L 129   Potassium 3.5 - 5.1 mmol/L 3.7   Chloride 98 - 111 mmol/L 88   CO2 22 - 32 mmol/L 22   Calcium  8.9 - 10.3 mg/dL 8.7     [name], DO 10/02/2024, 8:15 AM  PGY-1, [facility] FPTS Intern pager: [phone], text pages welcome Secure chat group [facility] Teaching Service   "

## 2024-10-02 NOTE — Assessment & Plan Note (Signed)
 T2DM: hold nighttime Lantus  on admission, moderate SSI HLD: continue home Pravastatin  40 mg daily CAD: Holding ASA in the setting of anemia requiring recurrent transfusions per cardiology Chronic SVT of L small saphenous vein - no chronic anticoagulatin per vascular

## 2024-10-02 NOTE — Assessment & Plan Note (Signed)
" >>  ASSESSMENT AND PLAN FOR SUSPECTED VOCAL CORD DYSFUNCTION WRITTEN ON 10/02/2024 12:14 PM BY Piya Mesch, DO  L vocal cord paralysis seen on fiberoptic scope which is likely contributing to SOB. - ENT consulted, appreciate recommendations - CT neck with contrast once patient is able to resume dialysis - Considier intubation or tracheostomy if condition worsens - Hydroxyzine  10mg  TID PRN "

## 2024-10-02 NOTE — Assessment & Plan Note (Addendum)
 L vocal cord paralysis seen on fiberoptic scope which is likely contributing to SOB. - ENT consulted, appreciate recommendations - CT neck with contrast once patient is able to resume dialysis - Considier intubation or tracheostomy if condition worsens - Hydroxyzine  10mg  TID PRN

## 2024-10-02 NOTE — Assessment & Plan Note (Signed)
 Bcx with MRSA likely 2/2 temp HD line. Afebrile, leukocytosis improving. - ID consulted, appreciate recommendations - Continue vancomycin  per pharmacy (1/19 - ) - Continue PO Augmentin  250-125 mg q12hrs (1/20-1/24) for PNA - Recommend TEE given persistent leukocytosis, likely 1/26 - Line holiday x48hrs (1/21-1/23) - Bcx from 1/21 NG @ 2 days - Repeat Bcx today to ensure MRSA clearance

## 2024-10-02 NOTE — Assessment & Plan Note (Addendum)
 Bcx with MRSA likely 2/2 temp HD line. Afebrile, leukocytosis improving. - ID consulted, appreciate recommendations - Continue vancomycin  per pharmacy (1/19 - ) - Continue PO Augmentin  250-125 mg q12hrs (1/20-1/24) for PNA - Recommend TEE given persistent leukocytosis, likely 1/26 - Line holiday x48hrs (1/21-1/23) - Bcx from 1/21 NG @ 2 days - Repeat Bcx today to ensure MRSA clearance

## 2024-10-02 NOTE — Progress Notes (Addendum)
" °  Progress Note    10/02/2024 7:23 AM * Day of Surgery *   Was scheduled for elective placement of TDC and left AV fistula vs Graft. Due to her Leukocytosis of 23 K and Hgb of 6.6 this morning we are going to postpone her surgery. This can be arranged next week once her acute infection is resolved. From our standpoint she is okay to eat.   CBC    Component Value Date/Time   WBC 23.2 (H) 10/02/2024 0422   RBC 2.54 (L) 10/02/2024 0422   HGB 6.6 (LL) 10/02/2024 0422   HGB 8.2 (L) 12/30/2023 1123   HCT 20.7 (L) 10/02/2024 0422   HCT 25.7 (L) 12/30/2023 1123   PLT 187 10/02/2024 0422   PLT 235 12/30/2023 1123   MCV 81.5 10/02/2024 0422   MCV 81 12/30/2023 1123   MCH 26.0 10/02/2024 0422   MCHC 31.9 10/02/2024 0422   RDW 17.3 (H) 10/02/2024 0422   RDW 16.4 (H) 12/30/2023 1123   LYMPHSABS 1.5 09/25/2024 2134   MONOABS 0.9 09/25/2024 2134   EOSABS 0.2 09/25/2024 2134   BASOSABS 0.1 09/25/2024 2134    BMET    Component Value Date/Time   NA 129 (L) 10/02/2024 0422   NA 138 01/07/2024 1507   K 3.7 10/02/2024 0422   CL 88 (L) 10/02/2024 0422   CO2 22 10/02/2024 0422   GLUCOSE 96 10/02/2024 0422   BUN 82 (H) 10/02/2024 0422   BUN 89 (HH) 01/07/2024 1507   CREATININE 9.04 (H) 10/02/2024 0422   CALCIUM  8.7 (L) 10/02/2024 0422   CALCIUM  8.7 12/11/2023 0623   GFRNONAA 4 (L) 10/02/2024 0422   GFRAA 17 (L) 09/02/2017 2149    INR    Component Value Date/Time   INR 1.0 09/23/2024 1312    No intake or output data in the 24 hours ending 10/02/24 0723    Teretha Damme, PA-C [facility] [phone] 10/02/2024 7:23 AM  I agree with the above.  I have seen and evaluated the patient.  The patient we canceled today due to her anemia and persistent white count.  We will be available next week if she improves for access placement  Wells Athziri Freundlich "

## 2024-10-02 NOTE — Progress Notes (Signed)
 Patient ID: Catherine Aguilar, female   DOB: 1956-02-27, 69 y.o.   MRN: [id number] [facility] Progress Note   Assessment/ Plan:   1.  End-stage renal disease: From progressive underlying chronic kidney disease secondary to hypertension, type 2 diabetes mellitus and distant history of ANCA vasculitis.  Started on hemodialysis on 09/26/2024 via temporary right dialysis catheter dialysis catheter and had her third dialysis treatment on 1/20.  Her temporary dialysis catheter was removed on 1/21 and she remains on a catheter holiday.  Plans for [facility] placement/permanent access today were appropriately canceled because of persistent leukocytosis.  She does not have any indications for dialysis at this time and we will monitor her through the weekend to determine need for dialysis; which will need to be done through a temporary dialysis catheter (otherwise may be able to undertake this on Monday after she has access placed). 2.  Dyspnea: Appears to be multifactorial including from volume (removed with dialysis).  Seen by ENT on 1/21 with fiberoptic laryngoscopy showing left vocal cord paralysis in paramedian position (intubation versus tracheostomy recommended for future problems). 3.  Hypertension: Blood pressure appears to be currently under decent control with ongoing therapies and UF on HD. 4.  Anemia: Likely secondary to chronic illness including chronic kidney disease, status post ESA as an outpatient on 09/23/2024 which I will redose again today.  No overt blood loss noted. 5.  Secondary hyperparathyroidism: Calcium  level marginally low when corrected for albumin.  Started on phosphorus binder.  Subjective:   Denies any acute events overnight, surgery for TDC/permanent access canceled because of persistent leukocytosis and anemia.   Objective:   BP (!) 128/48   Pulse 83   Temp 98.6 F (37 C) (Oral)   Resp (!) 22   Ht 5' 7 (1.702 m)   Wt 79.8 kg   SpO2 (!) 86%   BMI 27.55 kg/m  No  intake or output data in the 24 hours ending 10/02/24 0933  Weight change: 1.3 kg  Physical Exam: Gen: Appears comfortable sitting on the edge of her bed CVS: Pulse regular rhythm, normal rate, S1 and S2 normal.   Resp: Fine rales right base otherwise distant breath sounds Abd: Soft, obese, nontender, bowel sounds normal Ext: Trace lower extremity edema  Imaging: No results found.   Labs: BMET Recent Labs  Lab 09/27/24 0401 09/28/24 0124 09/28/24 1323 09/29/24 1107 09/30/24 0716 09/30/24 0725 10/01/24 0321 10/02/24 0422  NA 135 133* 133* 131* 134* 134* 130* 129*  K 3.1* 3.6 3.5 4.0 3.9 3.9 3.8 3.7  CL 95* 92* 93* 90* 93* 92* 88* 88*  CO2 20* 22 24 21* 24 24 23 22   GLUCOSE 112* 105* 112* 105* 96 96 102* 96  BUN 110* 117* 69* 85* 56* 57* 71* 82*  CREATININE 10.40* 11.30* 7.34* 8.79* 6.71* 6.60* 7.92* 9.04*  CALCIUM  8.4* 7.5* 8.1* 8.1* 9.1 9.1 9.0 8.7*  PHOS 5.8* 7.4*  --  7.2*  --  6.6* 7.4* 7.2*   CBC Recent Labs  Lab 09/25/24 2134 09/26/24 0343 09/29/24 1107 09/30/24 0716 10/01/24 0321 10/02/24 0422  WBC 12.0*   < > 36.8* 32.1* 30.0* 23.2*  NEUTROABS 9.3*  --   --   --   --   --   HGB 8.0*   < > 7.6* 7.3* 7.0* 6.6*  HCT 24.8*   < > 23.3* 22.6* 21.7* 20.7*  MCV 80.8   < > 80.6 81.3 81.9 81.5  PLT 151   < > 166  188 184 187   < > = values in this interval not displayed.    Medications:     sodium chloride    Intravenous Once   amLODipine   10 mg Oral Daily   amoxicillin -clavulanate  1 tablet Oral Q12H   budesonide -glycopyrrolate -formoterol   2 puff Inhalation Daily   calcium  acetate  667 mg Oral TID WC   carvedilol   12.5 mg Oral BID WC   Chlorhexidine  Gluconate Cloth  6 each Topical Daily   doxazosin   2 mg Oral Daily   heparin   5,000 Units Subcutaneous Q8H   hydrALAZINE   100 mg Oral Q8H   insulin  aspart  0-15 Units Subcutaneous TID WC   ipratropium-albuterol   3 mL Nebulization BID   mupirocin  ointment  1 Application Nasal BID   pravastatin   40 mg Oral  QHS   vancomycin  variable dose per unstable renal function (pharmacist dosing)   Does not apply See admin instructions   Gordy Blanch, MD 10/02/2024, 9:33 AM

## 2024-10-02 NOTE — Progress Notes (Signed)
" ° ° °  PROCEDURAL EXPEDITER PROGRESS NOTE  Patient Name: Catherine Aguilar  DOB:[birth year] Date of Admission: 09/25/2024  Date of Assessment:10/02/24   -------------------------------------------------------------------------------------------------------------------   Brief clinical summary: Pt to OR today (possibly depending on WBCs) for creation left arm AV fistula versua placement AV graft; insertion of tunneled dialysis catheter  Orders in place:  Yes   Labs, test, and orders reviewed: Y  Requires surgical clearance:  No  Barriers noted: N/A  -------------------------------------------------------------------------------------------------------------------  [facility] [employer], [place], [place] Please contact us  directly via secure chat (search for [facility]) or by calling us  at [phone] [facility]).  "

## 2024-10-02 NOTE — Assessment & Plan Note (Addendum)
 Hgb 6.6. Denies bleeding. Likely from CKD. - 1u PRBCs - Post--transfusion CBC with diff (prefer this over H/H given drop in Hgb) - Nephrology to redose ESA - Transfusion threshold of <8 - Daily CBC

## 2024-10-02 NOTE — Progress Notes (Signed)
 Pharmacy Antibiotic Note  Catherine Aguilar is a 69 y.o. female admitted on 09/25/2024 with pneumonia and bacteremia.  Pharmacy has been consulted for Vancomycin  dosing.  Plan: Vancomycin  Random level 17 today (1/23), which is therapeutic, will give a dose of 750 mg x 1 and recheck random level Monday with AM labs given patient did not have temporary cath placed for dialysis due to persistent leukocytosis Continue to monitor repeat blood cultures for clearance of infection Continue to monitor renal function, HD recommendations per Nephrology, and s/sx of clinical worsening Continue Augmentin  for aspiration pneumonia treatment which will end on 1/25  Height: 5' 7 (170.2 cm) Weight: 79.8 kg (175 lb 14.8 oz) IBW/kg (Calculated) : 61.6  Temp (24hrs), Avg:99.2 F (37.3 C), Min:97.9 F (36.6 C), Max:100.1 F (37.8 C)  Recent Labs  Lab 09/28/24 0124 09/28/24 1323 09/29/24 1107 09/30/24 0716 09/30/24 0725 10/01/24 0321 10/02/24 0422  WBC 16.1*  --  36.8* 32.1*  --  30.0* 23.2*  CREATININE 11.30*   < > 8.79* 6.71* 6.60* 7.92* 9.04*  VANCORANDOM  --   --   --  21  --   --  17   < > = values in this interval not displayed.    Estimated Creatinine Clearance: 6.4 mL/min (A) (by C-G formula based on SCr of 9.04 mg/dL (H)).    Allergies[1]  Antimicrobials this admission: VAN 1750 mg x 1 on 1/19 Augmentin  01/20 >> 01/25 Zosyn  01/19 >> 01/20  Microbiology results: 01/21 VR: 21  01/19 MRSA Nares: positive 01/19 Bcx: GPC in clusters (most likely MRSA) 01/19: BCID: MRSA bacteremia 01/20: Sputum culture  Thank you for allowing pharmacy to be a part of this patients care.  R. Samual Satterfield, PharmD PGY-1 Acute Care Pharmacy Resident [facility] Please refer to [facility] for [facility] Pharmacy numbers 10/02/2024 3:10 PM       [1] No Known Allergies

## 2024-10-03 DIAGNOSIS — R7881 Bacteremia: Secondary | ICD-10-CM | POA: Diagnosis not present

## 2024-10-03 DIAGNOSIS — B9562 Methicillin resistant Staphylococcus aureus infection as the cause of diseases classified elsewhere: Secondary | ICD-10-CM | POA: Diagnosis not present

## 2024-10-03 LAB — CBC
HCT: 22.5 % — ABNORMAL LOW (ref 36.0–46.0)
Hemoglobin: 7.4 g/dL — ABNORMAL LOW (ref 12.0–15.0)
MCH: 26.5 pg (ref 26.0–34.0)
MCHC: 32.9 g/dL (ref 30.0–36.0)
MCV: 80.6 fL (ref 80.0–100.0)
Platelets: 198 10*3/uL (ref 150–400)
RBC: 2.79 MIL/uL — ABNORMAL LOW (ref 3.87–5.11)
RDW: 16.6 % — ABNORMAL HIGH (ref 11.5–15.5)
WBC: 18.2 10*3/uL — ABNORMAL HIGH (ref 4.0–10.5)
nRBC: 0.1 % (ref 0.0–0.2)

## 2024-10-03 LAB — RENAL FUNCTION PANEL
Albumin: 3.1 g/dL — ABNORMAL LOW (ref 3.5–5.0)
Anion gap: 25 — ABNORMAL HIGH (ref 5–15)
BUN: 91 mg/dL — ABNORMAL HIGH (ref 8–23)
CO2: 17 mmol/L — ABNORMAL LOW (ref 22–32)
Calcium: 8.5 mg/dL — ABNORMAL LOW (ref 8.9–10.3)
Chloride: 90 mmol/L — ABNORMAL LOW (ref 98–111)
Creatinine, Ser: 10.2 mg/dL — ABNORMAL HIGH (ref 0.44–1.00)
GFR, Estimated: 4 mL/min — ABNORMAL LOW
Glucose, Bld: 95 mg/dL (ref 70–99)
Phosphorus: 7.2 mg/dL — ABNORMAL HIGH (ref 2.5–4.6)
Potassium: 3.6 mmol/L (ref 3.5–5.1)
Sodium: 131 mmol/L — ABNORMAL LOW (ref 135–145)

## 2024-10-03 LAB — GLUCOSE, CAPILLARY
Glucose-Capillary: 105 mg/dL — ABNORMAL HIGH (ref 70–99)
Glucose-Capillary: 144 mg/dL — ABNORMAL HIGH (ref 70–99)
Glucose-Capillary: 122 mg/dL — ABNORMAL HIGH (ref 70–99)
Glucose-Capillary: 99 mg/dL (ref 70–99)

## 2024-10-03 MED ORDER — OXYCODONE HCL 5 MG PO TABS
5.0000 mg | ORAL_TABLET | Freq: Once | ORAL | Status: AC
  Administered 2024-10-03: 5 mg via ORAL
  Filled 2024-10-03: qty 1

## 2024-10-03 NOTE — Assessment & Plan Note (Signed)
 Hgb 6.6 > 7.7 s/p 1u PRBCs. CBC with diff showed elevated neutrophils consistent with bacteremia. - Nephrology to redose ESA - Transfusion threshold of <8 - Daily CBC

## 2024-10-03 NOTE — Assessment & Plan Note (Signed)
" >>  ASSESSMENT AND PLAN FOR SUSPECTED VOCAL CORD DYSFUNCTION WRITTEN ON 10/03/2024  7:30 AM BY Harsimran Westman, DO  L vocal cord paralysis seen on fiberoptic scope which is likely contributing to SOB. - ENT consulted, appreciate recommendations - CT neck with contrast once patient is able to resume dialysis - Considier intubation or tracheostomy if condition worsens - Hydroxyzine  10mg  TID PRN "

## 2024-10-03 NOTE — Assessment & Plan Note (Addendum)
 Stable, normal WOB on exam. - ENT consulted, appreciate recommendations - CT neck with contrast once patient is able to resume dialysis - Hydroxyzine  10mg  TID PRN

## 2024-10-03 NOTE — Assessment & Plan Note (Addendum)
 Afebrile, leukocytosis improving (23.2 > 18.2). Bacteremia 2/2 temp HD line which was removed 1/21.  - ID consulted, appreciate recommendations - Continue vancomycin  per pharmacy (1/19 - ) - Continue PO Augmentin  250-125 mg q12hrs (1/20-1/24) for PNA - Recommend TEE given persistent leukocytosis, likely 1/26 - Bcx from 1/21 NG @ 3 days - Bcx post-catheter removal pending

## 2024-10-03 NOTE — Assessment & Plan Note (Deleted)
 Cr 9.04 > 10.2, s/p dialysis x3. No documented output, weight stable at 79.8. TDC and AVF/G surgery with VVS pending resolution of MRSA bacteremia. Line holiday until 1/23. - Nephrology consulted, appreciate recommendations. - Plan for TDC/AVG likely next week once leukocytosis improves - No indication for dialysis at this time, nephro to monitor to determine need for dialysis over the weekend, will place temp cath if necessary - Tylenol  650 mg q6hrs PRN PO or rectal for pain - Avoid nephrotoxic agents - Strict I&Os, daily weights - Daily CBC, RFP

## 2024-10-03 NOTE — Assessment & Plan Note (Addendum)
 Stable, continue monitoring given patient will likely not receive dialysis until next week - Cardiology consulted, appreciate recommendations - Continue Carvedilol  12.5 BID, Hydralazine  100 mg TID - Continue home Amlodipine  10 mg daily and Doxazosin  2 mg daily

## 2024-10-03 NOTE — Assessment & Plan Note (Addendum)
 Wheezing present at baseline though clear lungs on exam this morning. - Continue home Breztri  inhaler 2 puffs daily  - Continue DuoNebs q4hrs PRN - SCH + PRN - Benzonatate  200 mg TID PRN for cough

## 2024-10-03 NOTE — Progress Notes (Signed)
 Patient ID: Catherine Aguilar, female   DOB: [birth year], 69 y.o.   MRN: [id number] [facility] Progress Note   Assessment/ Plan:   1.  End-stage renal disease: From progressive underlying chronic kidney disease secondary to hypertension, type 2 diabetes mellitus and distant history of ANCA vasculitis.  Started on hemodialysis on 09/26/2024 via temporary right dialysis catheter dialysis catheter and had her third dialysis treatment on 1/20.  Her temporary dialysis catheter was removed on 1/21 and she remains on a catheter holiday.  TDC/permanent access surgery canceled yesterday because of persistent leukocytosis and fortunately, she does not have any acute indications for dialysis at this time.  Anticipate ability to undertake dialysis on Monday or Tuesday of next week after access placement. 2.  Dyspnea: Appears to be multifactorial including from volume (removed with dialysis).  Seen by ENT on 1/21 with fiberoptic laryngoscopy showing left vocal cord paralysis in paramedian position (intubation versus tracheostomy recommended for future problems). 3.  Hypertension: Blood pressure appears to be currently under decent control with ongoing therapies and UF on HD. 4.  Anemia: Likely secondary to chronic illness including chronic kidney disease, status post ESA as an outpatient on 09/23/2024 which I will redose again today.  No overt blood loss noted. 5.  Secondary hyperparathyroidism: Calcium  level marginally low when corrected for albumin.  Started on phosphorus binder.  Subjective:   Without acute events or concerns overnight.   Objective:   BP (!) 133/56 (BP Location: Left Arm)   Pulse 86   Temp 99.3 F (37.4 C) (Oral)   Resp 20   Ht 5' 7 (1.702 m)   Wt 79.8 kg   SpO2 93%   BMI 27.55 kg/m   Intake/Output Summary (Last 24 hours) at 10/03/2024 0915 Last data filed at 10/02/2024 2000 Gross per 24 hour  Intake 660.82 ml  Output --  Net 660.82 ml    Weight change: 0  kg  Physical Exam: Gen: Resting comfortably in bed on oxygen via nasal cannula CVS: Pulse regular rhythm, normal rate, S1 and S2 normal.   Resp: Poor inspiratory effort with decreased breath sounds over bases, no rales/rhonchi Abd: Soft, obese, nontender, bowel sounds normal Ext: Trace lower extremity edema  Imaging: No results found.   Labs: BMET Recent Labs  Lab 09/27/24 0401 09/28/24 0124 09/28/24 1323 09/29/24 1107 09/30/24 0716 09/30/24 0725 10/01/24 0321 10/02/24 0422 10/03/24 0315  NA 135 133* 133* 131* 134* 134* 130* 129* 131*  K 3.1* 3.6 3.5 4.0 3.9 3.9 3.8 3.7 3.6  CL 95* 92* 93* 90* 93* 92* 88* 88* 90*  CO2 20* 22 24 21* 24 24 23 22  17*  GLUCOSE 112* 105* 112* 105* 96 96 102* 96 95  BUN 110* 117* 69* 85* 56* 57* 71* 82* 91*  CREATININE 10.40* 11.30* 7.34* 8.79* 6.71* 6.60* 7.92* 9.04* 10.20*  CALCIUM  8.4* 7.5* 8.1* 8.1* 9.1 9.1 9.0 8.7* 8.5*  PHOS 5.8* 7.4*  --  7.2*  --  6.6* 7.4* 7.2* 7.2*   CBC Recent Labs  Lab 10/01/24 0321 10/02/24 0422 10/02/24 2159 10/03/24 0315  WBC 30.0* 23.2* 19.7* 18.2*  NEUTROABS  --   --  16.3*  --   HGB 7.0* 6.6* 7.7* 7.4*  HCT 21.7* 20.7* 23.0* 22.5*  MCV 81.9 81.5 80.1 80.6  PLT 184 187 205 198    Medications:     amLODipine   10 mg Oral Daily   amoxicillin -clavulanate  1 tablet Oral Q12H   budesonide -glycopyrrolate -formoterol   2 puff Inhalation Daily  calcium  acetate  667 mg Oral TID WC   carvedilol   12.5 mg Oral BID WC   Chlorhexidine  Gluconate Cloth  6 each Topical Daily   doxazosin   2 mg Oral Daily   heparin   5,000 Units Subcutaneous Q8H   hydrALAZINE   100 mg Oral Q8H   insulin  aspart  0-15 Units Subcutaneous TID WC   ipratropium-albuterol   3 mL Nebulization BID   mupirocin  ointment  1 Application Nasal BID   pravastatin   40 mg Oral QHS   vancomycin  variable dose per unstable renal function (pharmacist dosing)   Does not apply See admin instructions   Gordy Blanch, MD 10/03/2024, 9:15 AM

## 2024-10-03 NOTE — Assessment & Plan Note (Deleted)
 Hgb 6.6 > 7.7 s/p 1u PRBCs. CBC with diff showed elevated neutrophils consistent with bacteremia. - Nephrology to redose ESA - Transfusion threshold of <8 - Daily CBC

## 2024-10-03 NOTE — Assessment & Plan Note (Addendum)
 Cr 9.04 > 10.2. No documented output, weight stable at 79.8. TDC and AVF/G surgery with VVS pending improved leukocytosis. - Nephrology consulted, appreciate recommendations. - No indication for dialysis at this time, nephro to monitor through the weekend. - Tylenol  650 mg q6hrs PRN PO or rectal for pain - Avoid nephrotoxic agents - Strict I&Os, daily weights - Daily CBC, RFP

## 2024-10-03 NOTE — Progress Notes (Signed)
 "    Daily Progress Note Intern Pager: [phone]  Patient name: Catherine Aguilar Medical record number: [id number] Date of birth: 1956-04-24 Age: 69 y.o. Gender: female  Primary Care Provider: Adele Song, MD Consultants: Nephrology, Cardiology, VVS, ENT, ID Code Status: Full code  Pt Overview and Major Events to Date:  1/17 - admitted, temp HD catheter placed 1/18 - initiated dialysis 1/19 - dialysis; rapid response called x2 due to SOB and increased WOB 1/20 - dialysis; rapid response called overnight for inc WOB 1/21 - temp HD cath removed 2/2 MRSA bacteremia  Assessment and Plan: Catherine Aguilar is a 69 y.o. F with PMHx of T2DM, HTN, CKD stage V, anemia of chronic disease, COPD, Wegener's granulomatosis with renal involvement who was admitted for CHF exacerbation likely 2/2 volume overload from CKD5. Temp cath placed 1/17 and diuresing well but now with MRSA bacteremia likely from HD cath which has since been removed. Holding off on permanent TDC/AVG placement pending improving leukocytosis, likely next week. Nephrology will dialyze as necessary. Assessment & Plan MRSA bacteremia Aspiration pneumonia of both lungs (HCC) Afebrile, leukocytosis improving (23.2 > 18.2). Bacteremia 2/2 temp HD line which was removed 1/21.  - ID consulted, appreciate recommendations - Continue vancomycin  per pharmacy (1/19 - ) - Continue PO Augmentin  250-125 mg q12hrs (1/20-1/24) for PNA - Recommend TEE given persistent leukocytosis, likely 1/26 - Bcx from 1/21 NG @ 3 days - Bcx post-catheter removal pending Anemia in chronic kidney disease (CKD) Hgb 6.6 > 7.7 s/p 1u PRBCs. CBC with diff showed elevated neutrophils consistent with bacteremia. - Nephrology to redose ESA - Transfusion threshold of <8 - Daily CBC Acute on chronic congestive heart failure (HCC) ESRD on hemodialysis (HCC) Cr 9.04 > 10.2. No documented output, weight stable at 79.8. TDC and AVF/G surgery with VVS pending improved  leukocytosis. - Nephrology consulted, appreciate recommendations. - No indication for dialysis at this time, nephro to monitor through the weekend. - Tylenol  650 mg q6hrs PRN PO or rectal for pain - Avoid nephrotoxic agents - Strict I&Os, daily weights - Daily CBC, RFP Left vocal cord paralysis Shortness of breath Stable, normal WOB on exam. - ENT consulted, appreciate recommendations - CT neck with contrast once patient is able to resume dialysis - Hydroxyzine  10mg  TID PRN COPD (chronic obstructive pulmonary disease) (HCC) Wheezing present at baseline though clear lungs on exam this morning. - Continue home Breztri  inhaler 2 puffs daily  - Continue DuoNebs q4hrs PRN - SCH + PRN - Benzonatate  200 mg TID PRN for cough Essential hypertension Stable, continue monitoring given patient will likely not receive dialysis until next week - Cardiology consulted, appreciate recommendations - Continue Carvedilol  12.5 BID, Hydralazine  100 mg TID - Continue home Amlodipine  10 mg daily and Doxazosin  2 mg daily Chronic health problem T2DM: hold nighttime Lantus  on admission, moderate SSI HLD: continue home Pravastatin  40 mg daily CAD: Holding ASA in the setting of anemia requiring recurrent transfusions per cardiology Chronic SVT of L small saphenous vein - no chronic anticoagulatin per vascular  FEN/GI: Renal with fluid restriction, thin PPx: Heparin  Dispo:Pending PT recommendations  pending clinical improvement   Subjective:  Patient seen sleeping in bed this morning.  Objective: Temp:  [98.2 F (36.8 C)-100.2 F (37.9 C)] 98.2 F (36.8 C) (01/24 0446) Pulse Rate:  [85-95] 95 (01/23 2021) Resp:  [20-28] 20 (01/24 0446) BP: (128-148)/(48-79) 133/65 (01/24 0446) SpO2:  [91 %-95 %] 91 % (01/24 0446) Weight:  [79.8 kg] 79.8 kg (01/24 0446)  Physical  Exam: General: asleep, NAD Cardiovascular: RRR Pulmonary: Normal WOB, CTAB, no wheezing Extremities: Warm, no  edema   Laboratory: Most recent CBC Lab Results  Component Value Date   WBC 18.2 (H) 10/03/2024   HGB 7.4 (L) 10/03/2024   HCT 22.5 (L) 10/03/2024   MCV 80.6 10/03/2024   PLT 198 10/03/2024   Most recent BMP    Latest Ref Rng & Units 10/03/2024    3:15 AM  BMP  Glucose 70 - 99 mg/dL 95   BUN 8 - 23 mg/dL 91   Creatinine 9.55 - 1.00 mg/dL 89.79   Sodium 864 - 854 mmol/L 131   Potassium 3.5 - 5.1 mmol/L 3.6   Chloride 98 - 111 mmol/L 90   CO2 22 - 32 mmol/L 17   Calcium  8.9 - 10.3 mg/dL 8.5     Catherine Gathers, DO 10/03/2024, 7:22 AM  PGY-1, [facility] FPTS Intern pager: [phone], text pages welcome Secure chat group [facility] Teaching Service   "

## 2024-10-03 NOTE — Assessment & Plan Note (Deleted)
 L vocal cord paralysis seen on fiberoptic scope which is likely contributing to SOB. - ENT consulted, appreciate recommendations - CT neck with contrast once patient is able to resume dialysis - Considier intubation or tracheostomy if condition worsens - Hydroxyzine  10mg  TID PRN

## 2024-10-03 NOTE — Assessment & Plan Note (Addendum)
 T2DM: hold nighttime Lantus  on admission, moderate SSI HLD: continue home Pravastatin  40 mg daily CAD: Holding ASA in the setting of anemia requiring recurrent transfusions per cardiology Chronic SVT of L small saphenous vein - no chronic anticoagulatin per vascular

## 2024-10-04 DIAGNOSIS — N186 End stage renal disease: Secondary | ICD-10-CM

## 2024-10-04 DIAGNOSIS — B9562 Methicillin resistant Staphylococcus aureus infection as the cause of diseases classified elsewhere: Secondary | ICD-10-CM | POA: Diagnosis not present

## 2024-10-04 DIAGNOSIS — N2581 Secondary hyperparathyroidism of renal origin: Secondary | ICD-10-CM | POA: Insufficient documentation

## 2024-10-04 DIAGNOSIS — R7881 Bacteremia: Secondary | ICD-10-CM | POA: Diagnosis not present

## 2024-10-04 LAB — GLUCOSE, CAPILLARY
Glucose-Capillary: 92 mg/dL (ref 70–99)
Glucose-Capillary: 95 mg/dL (ref 70–99)
Glucose-Capillary: 82 mg/dL (ref 70–99)
Glucose-Capillary: 91 mg/dL (ref 70–99)

## 2024-10-04 LAB — CBC
HCT: 22.1 % — ABNORMAL LOW (ref 36.0–46.0)
Hemoglobin: 7.3 g/dL — ABNORMAL LOW (ref 12.0–15.0)
MCH: 26.4 pg (ref 26.0–34.0)
MCHC: 33 g/dL (ref 30.0–36.0)
MCV: 79.8 fL — ABNORMAL LOW (ref 80.0–100.0)
Platelets: 228 10*3/uL (ref 150–400)
RBC: 2.77 MIL/uL — ABNORMAL LOW (ref 3.87–5.11)
RDW: 16.4 % — ABNORMAL HIGH (ref 11.5–15.5)
WBC: 12.9 10*3/uL — ABNORMAL HIGH (ref 4.0–10.5)
nRBC: 0 % (ref 0.0–0.2)

## 2024-10-04 LAB — RENAL FUNCTION PANEL
Albumin: 3.1 g/dL — ABNORMAL LOW (ref 3.5–5.0)
Anion gap: 23 — ABNORMAL HIGH (ref 5–15)
BUN: 96 mg/dL — ABNORMAL HIGH (ref 8–23)
CO2: 19 mmol/L — ABNORMAL LOW (ref 22–32)
Calcium: 8.7 mg/dL — ABNORMAL LOW (ref 8.9–10.3)
Chloride: 89 mmol/L — ABNORMAL LOW (ref 98–111)
Creatinine, Ser: 10.9 mg/dL — ABNORMAL HIGH (ref 0.44–1.00)
GFR, Estimated: 3 mL/min — ABNORMAL LOW
Glucose, Bld: 89 mg/dL (ref 70–99)
Phosphorus: 7.7 mg/dL — ABNORMAL HIGH (ref 2.5–4.6)
Potassium: 3.8 mmol/L (ref 3.5–5.1)
Sodium: 130 mmol/L — ABNORMAL LOW (ref 135–145)

## 2024-10-04 MED ORDER — CEFAZOLIN SODIUM-DEXTROSE 2-4 GM/100ML-% IV SOLN
2.0000 g | INTRAVENOUS | Status: AC
  Filled 2024-10-04: qty 100

## 2024-10-04 MED ORDER — SODIUM CHLORIDE 0.9% IV SOLUTION: Freq: Once | INTRAVENOUS | Status: DC

## 2024-10-04 NOTE — Assessment & Plan Note (Signed)
 Cr, and hgb stable. - Nephrology consulted, appreciate recommendations. - Plan to reinitiate dialysis after tunneled catheter placement by VVS planned for 01/26 - Begin phosphate binder calcium  acetate 667mg  TID with meals - Redose ESA today, 01/25 - Transfusion threshold <8  - Continue Tylenol  650 mg q6hrs PRN PO or rectal for pain - Avoid nephrotoxic agents - Strict I&Os, daily weights - Daily RFP

## 2024-10-04 NOTE — Progress Notes (Signed)
 Patient ID: Catherine Aguilar, female   DOB: 21-Nov-1955, 69 y.o.   MRN: [id number] [facility] Progress Note   Assessment/ Plan:   1.  End-stage renal disease: From progressive underlying chronic kidney disease secondary to hypertension, type 2 diabetes mellitus and distant history of ANCA vasculitis.  Started on hemodialysis on 09/26/2024 via temporary right dialysis catheter dialysis catheter and had her third dialysis treatment on 1/20.  Her temporary dialysis catheter was removed on 1/21 and she remains on a catheter holiday.  TDC/permanent access surgery canceled last week because of significant leukocytosis; discussed with vascular surgery team, she will be tentatively placed on surgery schedule for tomorrow pending confirmation of continued improvement of the leukocytosis.  Will order for hemodialysis again tomorrow. 2.  Dyspnea: Appears to be multifactorial including from volume (removed with dialysis).  Seen by ENT on 1/21 with fiberoptic laryngoscopy showing left vocal cord paralysis in paramedian position (intubation versus tracheostomy recommended for future problems). 3.  Hypertension: Blood pressure appears to be currently under decent control with ongoing therapies and UF on HD. 4.  Anemia: Likely secondary to chronic illness including chronic kidney disease, status post ESA as an outpatient on 09/23/2024 which I will redose again today.  No overt blood loss noted. 5.  Secondary hyperparathyroidism: Calcium  level marginally low when corrected for albumin.  Started on phosphorus binder.  Subjective:   No concerns overnight, denies any chest pain or shortness of breath.   Objective:   BP (!) 136/52 (BP Location: Right Arm)   Pulse 86   Temp 99.3 F (37.4 C) (Oral)   Resp 17   Ht 5' 7 (1.702 m)   Wt 79.5 kg   SpO2 (!) 87%   BMI 27.45 kg/m   Intake/Output Summary (Last 24 hours) at 10/04/2024 9340 Last data filed at 10/03/2024 2030 Gross per 24 hour  Intake 240 ml   Output --  Net 240 ml    Weight change: -0.3 kg  Physical Exam: Gen: Sleeping comfortably in bed, awakens to voice CVS: Pulse regular rhythm, normal rate, S1 and S2 normal.   Resp: Poor inspiratory effort with decreased breath sounds over bases, no rales/rhonchi Abd: Soft, obese, nontender, bowel sounds normal Ext: Trace lower extremity edema  Imaging: No results found.   Labs: BMET Recent Labs  Lab 09/28/24 0124 09/28/24 1323 09/29/24 1107 09/30/24 9283 09/30/24 0725 10/01/24 0321 10/02/24 0422 10/03/24 0315 10/04/24 0315  NA 133*   < > 131* 134* 134* 130* 129* 131* 130*  K 3.6   < > 4.0 3.9 3.9 3.8 3.7 3.6 3.8  CL 92*   < > 90* 93* 92* 88* 88* 90* 89*  CO2 22   < > 21* 24 24 23 22  17* 19*  GLUCOSE 105*   < > 105* 96 96 102* 96 95 89  BUN 117*   < > 85* 56* 57* 71* 82* 91* 96*  CREATININE 11.30*   < > 8.79* 6.71* 6.60* 7.92* 9.04* 10.20* 10.90*  CALCIUM  7.5*   < > 8.1* 9.1 9.1 9.0 8.7* 8.5* 8.7*  PHOS 7.4*  --  7.2*  --  6.6* 7.4* 7.2* 7.2* 7.7*   < > = values in this interval not displayed.   CBC Recent Labs  Lab 10/02/24 0422 10/02/24 2159 10/03/24 0315 10/04/24 0315  WBC 23.2* 19.7* 18.2* 12.9*  NEUTROABS  --  16.3*  --   --   HGB 6.6* 7.7* 7.4* 7.3*  HCT 20.7* 23.0* 22.5* 22.1*  MCV  81.5 80.1 80.6 79.8*  PLT 187 205 198 228    Medications:     amLODipine   10 mg Oral Daily   budesonide -glycopyrrolate -formoterol   2 puff Inhalation Daily   calcium  acetate  667 mg Oral TID WC   carvedilol   12.5 mg Oral BID WC   Chlorhexidine  Gluconate Cloth  6 each Topical Daily   doxazosin   2 mg Oral Daily   heparin   5,000 Units Subcutaneous Q8H   hydrALAZINE   100 mg Oral Q8H   insulin  aspart  0-15 Units Subcutaneous TID WC   ipratropium-albuterol   3 mL Nebulization BID   mupirocin  ointment  1 Application Nasal BID   pravastatin   40 mg Oral QHS   vancomycin  variable dose per unstable renal function (pharmacist dosing)   Does not apply See admin instructions    Gordy Blanch, MD 10/04/2024, 6:59 AM

## 2024-10-04 NOTE — Assessment & Plan Note (Addendum)
 MRSA bacteremia 2/2 temp HD line which was removed 01/21.  Leukocytosis continues to improve at 12.9 today. - ID consulted, appreciate recommendations - Continue vancomycin  per pharmacy (1/19 - ) - Completed PO Augmentin  250-125 mg q12hrs (1/20-1/24) for PNA - Recommend TEE given persistent leukocytosis, planned for 01/26 - Blood cultures from 01/21 NG x3 days, blood cultures from 01/23 NG <12 hours (post catheter removal) - VVS consulted, appreciate recommendations  - Move left arm IV today and restrict left arm  - With improved leukocytosis and discussion with nephrology Dr. Tobie, plan for tunneled dialysis catheter placement surgery tomorrow 01/26 after morning labs.  N.p.o. at midnight. - Daily CBC

## 2024-10-04 NOTE — Assessment & Plan Note (Addendum)
 Weight stable. No complaints of CP or SOB. - ENT consulted, appreciate recommendations - CT neck with contrast once patient is able to resume dialysis - Continue hydroxyzine  10mg  TID PRN - Tylenol  650 mg q6hrs PRN PO or rectal for pain - Strict I&Os, daily weights

## 2024-10-04 NOTE — Assessment & Plan Note (Addendum)
 Normal WOB on RA. - Continue home Breztri  inhaler 2 puffs daily  - Continue DuoNebs BID scheduled and q2h PRN - Benzonatate  200 mg TID PRN for cough

## 2024-10-04 NOTE — Assessment & Plan Note (Addendum)
 Cr, and hgb stable. - Nephrology consulted, appreciate recommendations. - Plan to reinitiate dialysis after tunneled catheter placement by VVS planned for 01/26 - Begin phosphate binder calcium  acetate 667mg  TID with meals - Redose ESA today, 01/25 - Transfusion threshold <8  - Continue Tylenol  650 mg q6hrs PRN PO or rectal for pain - Avoid nephrotoxic agents - Strict I&Os, daily weights - Daily RFP

## 2024-10-04 NOTE — Progress Notes (Signed)
 Existing IV access from her left forearm removed as per the order by Vascular, attempted IV insertion once, unsuccessful, notified to MD by IV team RN that patient will have a new IV tomorrow before the procedure.

## 2024-10-04 NOTE — Assessment & Plan Note (Addendum)
 Remains stable with SBP 120-130s. - Cardiology consulted, appreciate recommendations - Continue home carvedilol  12.5 BID, hydralazine  100 mg TID, home amlodipine  10 mg daily and doxazosin  2 mg daily

## 2024-10-04 NOTE — Care Management Important Message (Signed)
 Important Message  Patient Details  Name: Barb Shear MRN: [id number] Date of Birth: 1956-05-26   Important Message Given:  Yes - Medicare IM  Printed to unit and bedside RN to give to pt   Charlann Rayfield Hurst, RN 10/04/2024, 10:13 AM

## 2024-10-04 NOTE — Assessment & Plan Note (Addendum)
 MRSA bacteremia 2/2 temp HD line which was removed 01/21.  Leukocytosis continues to improve at 12.9 today. - ID consulted, appreciate recommendations - Continue vancomycin  per pharmacy (1/19 - ) - Completed PO Augmentin  250-125 mg q12hrs (1/20-1/24) for PNA - Recommend TEE given persistent leukocytosis, planned for 01/26 - Blood cultures from 01/21 NG x3 days, blood cultures from 01/23 NG <12 hours (post catheter removal) - VVS consulted, appreciate recommendations  - Move left arm IV today and restrict left arm  - With improved leukocytosis and discussion with nephrology Catherine Aguilar, plan for tunneled dialysis catheter placement surgery tomorrow 01/26 after morning labs.  N.p.o. at midnight. - Daily CBC

## 2024-10-04 NOTE — Assessment & Plan Note (Addendum)
 T2DM: fasting BG remain WNL and stable. Continue to hold nighttime Lantus ,  continue moderate SSI HLD: continue home Pravastatin  40 mg daily CAD: Continue to hold ASA in the setting of anemia requiring recurrent transfusions per cardiology Chronic SVT of L small saphenous vein - no chronic anticoagulation per vascular

## 2024-10-04 NOTE — Progress Notes (Signed)
 "    Daily Progress Note Intern Pager: [phone]  Patient name: Catherine Aguilar Medical record number: [id number] Date of birth: 06/10/1956 Age: 69 y.o. Gender: female  Primary Care Provider: Adele Song, MD Consultants: Nephrology, cardiology, VVS, ENT, ID Code Status: Full code  Pt Overview and Major Events to Date:  01/17: Admitted for CHF exacerbation secondary to progression of ESRD ISO medication nonadherence, temporary HD catheter placed 01/18: Hemodialysis initiated 01/19: Rapid response called x 2 due to shortness of breath and increased work of breathing 01/20: Rapid response called overnight for increased work of breathing 01/21: Fiberoptic laryngoscopy by ENT showing left vocal cord paralysis; temp hemodialysis catheter removed secondary to MRSA bacteremia 01/22: Chronic SVT of L small saphenous vein identified on LE DVT US  01/26: Plan for tunneled HD catheter procedure per VVS; plan for TEE per ID  Assessment and Plan:  Catherine Aguilar is a 69 year old female with past medical history of type 2 diabetes, HTN, CKD stage V, anemia of chronic disease, COPD, Wegener's granulomatosis with renal involvement admitted for CHF exacerbation secondary to progression of her ESRD ISO medication nonadherence.  Hospital course has been complicated by MRSA bacteremia likely from temporary HD catheter (removed as of 01/21) and episodes of severe shortness of breath and difficulty breathing with new diagnosis of unilateral vocal cord paralysis. Assessment & Plan MRSA bacteremia Aspiration pneumonia of both lungs (HCC) MRSA bacteremia 2/2 temp HD line which was removed 01/21.  Leukocytosis continues to improve at 12.9 today. - ID consulted, appreciate recommendations - Continue vancomycin  per pharmacy (1/19 - ) - Completed PO Augmentin  250-125 mg q12hrs (1/20-1/24) for PNA - Recommend TEE given persistent leukocytosis, planned for 01/26 - Blood cultures from 01/21 NG x3 days, blood cultures  from 01/23 NG <12 hours (post catheter removal) - VVS consulted, appreciate recommendations  - Move left arm IV today and restrict left arm  - With improved leukocytosis and discussion with nephrology Dr. Tobie, plan for tunneled dialysis catheter placement surgery tomorrow 01/26 after morning labs.  N.p.o. at midnight. - Daily CBC ESRD on hemodialysis (HCC) Anemia in chronic kidney disease (CKD) Secondary hyperparathyroidism Cr, and hgb stable. - Nephrology consulted, appreciate recommendations. - Plan to reinitiate dialysis after tunneled catheter placement by VVS planned for 01/26 - Begin phosphate binder calcium  acetate 667mg  TID with meals - Redose ESA today, 01/25 - Transfusion threshold <8  - Continue Tylenol  650 mg q6hrs PRN PO or rectal for pain - Avoid nephrotoxic agents - Strict I&Os, daily weights - Daily RFP Left vocal cord paralysis Shortness of breath Acute on chronic congestive heart failure (HCC) Weight stable. No complaints of CP or SOB. - ENT consulted, appreciate recommendations - CT neck with contrast once patient is able to resume dialysis - Continue hydroxyzine  10mg  TID PRN - Tylenol  650 mg q6hrs PRN PO or rectal for pain - Strict I&Os, daily weights COPD (chronic obstructive pulmonary disease) (HCC) Normal WOB on RA. - Continue home Breztri  inhaler 2 puffs daily  - Continue DuoNebs BID scheduled and q2h PRN - Benzonatate  200 mg TID PRN for cough Essential hypertension Remains stable with SBP 120-130s. - Cardiology consulted, appreciate recommendations - Continue home carvedilol  12.5 BID, hydralazine  100 mg TID, home amlodipine  10 mg daily and doxazosin  2 mg daily Chronic health problem T2DM: fasting BG remain WNL and stable. Continue to hold nighttime Lantus ,  continue moderate SSI HLD: continue home Pravastatin  40 mg daily CAD: Continue to hold ASA in the setting of anemia requiring recurrent transfusions per  cardiology Chronic SVT of L small saphenous  vein - no chronic anticoagulation per vascular  FEN/GI: Renal diet with fluid restriction of 1200 mL PPx: Heparin  Dispo:Home pending clinical improvement . Barriers include hemodialysis, MRSA bacteremia.   Subjective:  Patient was seen and examined at bedside.  She is resting comfortably with her eyes closed in hospital bed.  She answers all questions appropriately while resting.  She has no complaints today.  Objective: Temp:  [98.6 F (37 C)-99.3 F (37.4 C)] 99 F (37.2 C) (01/25 0740) Pulse Rate:  [86-90] 90 (01/25 0740) Resp:  [17-20] 18 (01/25 0740) BP: (119-151)/(52-68) 119/64 (01/25 0740) SpO2:  [87 %-97 %] 94 % (01/25 0740) Weight:  [79.5 kg] 79.5 kg (01/25 0640) Physical Exam: General: Lying supine resting comfortably in hospital bed with eyes closed, in no acute distress Cardiovascular: RRR, no M/R/G Respiratory: Normal work of breathing on room air, CTAB, no W/R/R, good respiratory effort, good air movement throughout all lung fields Abdomen: Soft, nontender, nondistended, bowel sounds present Extremities: No peripheral edema, moves all extremities equally, warm, dry and well-perfused  Laboratory: Most recent CBC Lab Results  Component Value Date   WBC 12.9 (H) 10/04/2024   HGB 7.3 (L) 10/04/2024   HCT 22.1 (L) 10/04/2024   MCV 79.8 (L) 10/04/2024   PLT 228 10/04/2024   Most recent BMP    Latest Ref Rng & Units 10/04/2024    3:15 AM  BMP  Glucose 70 - 99 mg/dL 89   BUN 8 - 23 mg/dL 96   Creatinine 9.55 - 1.00 mg/dL 89.09   Sodium 864 - 854 mmol/L 130   Potassium 3.5 - 5.1 mmol/L 3.8   Chloride 98 - 111 mmol/L 89   CO2 22 - 32 mmol/L 19   Calcium  8.9 - 10.3 mg/dL 8.7     Catherine Credit, DO 10/04/2024, 8:16 AM  PGY-1, [facility] FPTS Intern pager: [phone], text pages welcome Secure chat group [facility] Teaching Service   "

## 2024-10-04 NOTE — Progress Notes (Addendum)
" °  Progress Note    10/04/2024 6:53 AM Hospital Day 8  Subjective:  looks much better this morning than last week.  Says she feels better.    Tm 99.3  Vitals:   10/03/24 2306 10/04/24 0344  BP: (!) 123/58 (!) 136/52  Pulse:    Resp: 18 17  Temp: 98.9 F (37.2 C) 99.3 F (37.4 C)  SpO2: 97% (!) 87%    Physical Exam: General:  no distress Lungs:  non labored Extremities:  IV left arm  CBC    Component Value Date/Time   WBC 12.9 (H) 10/04/2024 0315   RBC 2.77 (L) 10/04/2024 0315   HGB 7.3 (L) 10/04/2024 0315   HGB 8.2 (L) 12/30/2023 1123   HCT 22.1 (L) 10/04/2024 0315   HCT 25.7 (L) 12/30/2023 1123   PLT 228 10/04/2024 0315   PLT 235 12/30/2023 1123   MCV 79.8 (L) 10/04/2024 0315   MCV 81 12/30/2023 1123   MCH 26.4 10/04/2024 0315   MCHC 33.0 10/04/2024 0315   RDW 16.4 (H) 10/04/2024 0315   RDW 16.4 (H) 12/30/2023 1123   LYMPHSABS 1.0 10/02/2024 2159   MONOABS 1.9 (H) 10/02/2024 2159   EOSABS 0.2 10/02/2024 2159   BASOSABS 0.1 10/02/2024 2159    BMET    Component Value Date/Time   NA 130 (L) 10/04/2024 0315   NA 138 01/07/2024 1507   K 3.8 10/04/2024 0315   CL 89 (L) 10/04/2024 0315   CO2 19 (L) 10/04/2024 0315   GLUCOSE 89 10/04/2024 0315   BUN 96 (H) 10/04/2024 0315   BUN 89 (HH) 01/07/2024 1507   CREATININE 10.90 (H) 10/04/2024 0315   CALCIUM  8.7 (L) 10/04/2024 0315   CALCIUM  8.7 12/11/2023 0623   GFRNONAA 3 (L) 10/04/2024 0315   GFRAA 17 (L) 09/02/2017 2149    INR    Component Value Date/Time   INR 1.0 09/23/2024 1312     Intake/Output Summary (Last 24 hours) at 10/04/2024 0653 Last data filed at 10/03/2024 2030 Gross per 24 hour  Intake 240 ml  Output --  Net 240 ml     Assessment/Plan:  69 y.o. female now with ESRD in need of TDC and left arm access  Hospital Day 8  -pt feeling much better.  There is IV in left arm.  Will have this moved and restrict left arm.   -leukocytosis improving and now 12.9 (down from 34k last week).  She  still had mild 99.3 fever this am.  Discussed with Dr. Tobie and he feels she will be ready for surgery tomorrow.   Will check labs and pt tomorrow to ensure she is ready. Will go ahead and make npo after MN.   -of note, she was evaluated by ENT on 1/21 with fiberoptic laryngoscopy showing left vocal cord paralysis in the paramedian position (intubation vs trach recommended for future problems)   Lucie Apt, PA-C [facility] [phone] 10/04/2024 6:53 AM   I agree with above.  Please remove the IV from the left arm as this is the surgical side.  Based on her vein mapping she will need a left arm AV graft.  Will also plan for tunneled dialysis catheter placement at the same time.  Tentatively planning for tomorrow, will be posted and consented.  Please make n.p.o. midnight  Norman GORMAN Serve MD [facility] Phone Number: [phone] 10/04/2024 9:56 AM   "

## 2024-10-04 NOTE — Progress Notes (Signed)
 There was a consult for placing a PIV access for tomorrow procedure. Talked patient's nurse regarding this matter and recommended placing a PIV access tomorrow for preventing possible new PIV access tomorrow due to something happen through the night. Secure chatted to Dr. Camie Dixons this concerns. Dr. Dixons is okay to put in a PIV access tomorrow. HS [employer]

## 2024-10-05 ENCOUNTER — Inpatient Hospital Stay (HOSPITAL_COMMUNITY): Admitting: Anesthesiology

## 2024-10-05 ENCOUNTER — Inpatient Hospital Stay (HOSPITAL_COMMUNITY)

## 2024-10-05 ENCOUNTER — Encounter (HOSPITAL_COMMUNITY): Admission: EM | Disposition: A | Payer: Self-pay | Source: Home / Self Care | Attending: Family Medicine

## 2024-10-05 ENCOUNTER — Encounter

## 2024-10-05 DIAGNOSIS — I132 Hypertensive heart and chronic kidney disease with heart failure and with stage 5 chronic kidney disease, or end stage renal disease: Secondary | ICD-10-CM | POA: Diagnosis not present

## 2024-10-05 DIAGNOSIS — E1122 Type 2 diabetes mellitus with diabetic chronic kidney disease: Secondary | ICD-10-CM | POA: Diagnosis not present

## 2024-10-05 DIAGNOSIS — N186 End stage renal disease: Secondary | ICD-10-CM | POA: Diagnosis not present

## 2024-10-05 DIAGNOSIS — B9562 Methicillin resistant Staphylococcus aureus infection as the cause of diseases classified elsewhere: Secondary | ICD-10-CM | POA: Diagnosis not present

## 2024-10-05 DIAGNOSIS — D631 Anemia in chronic kidney disease: Secondary | ICD-10-CM

## 2024-10-05 DIAGNOSIS — I2510 Atherosclerotic heart disease of native coronary artery without angina pectoris: Secondary | ICD-10-CM | POA: Insufficient documentation

## 2024-10-05 DIAGNOSIS — I509 Heart failure, unspecified: Secondary | ICD-10-CM

## 2024-10-05 DIAGNOSIS — T827XXA Infection and inflammatory reaction due to other cardiac and vascular devices, implants and grafts, initial encounter: Secondary | ICD-10-CM | POA: Diagnosis not present

## 2024-10-05 DIAGNOSIS — R7881 Bacteremia: Secondary | ICD-10-CM | POA: Diagnosis not present

## 2024-10-05 DIAGNOSIS — Z992 Dependence on renal dialysis: Secondary | ICD-10-CM | POA: Diagnosis not present

## 2024-10-05 LAB — RENAL FUNCTION PANEL
Albumin: 3.2 g/dL — ABNORMAL LOW (ref 3.5–5.0)
Anion gap: 26 — ABNORMAL HIGH (ref 5–15)
BUN: 106 mg/dL — ABNORMAL HIGH (ref 8–23)
CO2: 17 mmol/L — ABNORMAL LOW (ref 22–32)
Calcium: 9.1 mg/dL (ref 8.9–10.3)
Chloride: 90 mmol/L — ABNORMAL LOW (ref 98–111)
Creatinine, Ser: 12 mg/dL — ABNORMAL HIGH (ref 0.44–1.00)
GFR, Estimated: 3 mL/min — ABNORMAL LOW
Glucose, Bld: 70 mg/dL (ref 70–99)
Phosphorus: 9.2 mg/dL — ABNORMAL HIGH (ref 2.5–4.6)
Potassium: 4.4 mmol/L (ref 3.5–5.1)
Sodium: 133 mmol/L — ABNORMAL LOW (ref 135–145)

## 2024-10-05 LAB — CULTURE, BLOOD (ROUTINE X 2)
Culture: NO GROWTH
Culture: NO GROWTH
Culture: NO GROWTH
Culture: NO GROWTH
Special Requests: ADEQUATE
Special Requests: ADEQUATE
Special Requests: ADEQUATE
Special Requests: ADEQUATE

## 2024-10-05 LAB — CBC
HCT: 23.7 % — ABNORMAL LOW (ref 36.0–46.0)
Hemoglobin: 7.8 g/dL — ABNORMAL LOW (ref 12.0–15.0)
MCH: 26 pg (ref 26.0–34.0)
MCHC: 32.9 g/dL (ref 30.0–36.0)
MCV: 79 fL — ABNORMAL LOW (ref 80.0–100.0)
Platelets: 272 10*3/uL (ref 150–400)
RBC: 3 MIL/uL — ABNORMAL LOW (ref 3.87–5.11)
RDW: 16.5 % — ABNORMAL HIGH (ref 11.5–15.5)
WBC: 11 10*3/uL — ABNORMAL HIGH (ref 4.0–10.5)
nRBC: 0 % (ref 0.0–0.2)

## 2024-10-05 LAB — VANCOMYCIN, RANDOM: Vancomycin Rm: 20 ug/mL

## 2024-10-05 LAB — GLUCOSE, CAPILLARY
Glucose-Capillary: 68 mg/dL — ABNORMAL LOW (ref 70–99)
Glucose-Capillary: 111 mg/dL — ABNORMAL HIGH (ref 70–99)
Glucose-Capillary: 85 mg/dL (ref 70–99)
Glucose-Capillary: 114 mg/dL — ABNORMAL HIGH (ref 70–99)
Glucose-Capillary: 206 mg/dL — ABNORMAL HIGH (ref 70–99)
Glucose-Capillary: 294 mg/dL — ABNORMAL HIGH (ref 70–99)

## 2024-10-05 MED ORDER — FENTANYL CITRATE (PF) 100 MCG/2ML IJ SOLN
INTRAMUSCULAR | Status: AC
  Filled 2024-10-05: qty 2

## 2024-10-05 MED ORDER — HEPARIN SODIUM (PORCINE) 1000 UNIT/ML IJ SOLN
INTRAMUSCULAR | Status: DC | PRN
  Administered 2024-10-05: 1000 [IU]

## 2024-10-05 MED ORDER — PROPOFOL 10 MG/ML IV BOLUS
INTRAVENOUS | Status: DC | PRN
  Administered 2024-10-05: 100 mg via INTRAVENOUS

## 2024-10-05 MED ORDER — OXYCODONE HCL 5 MG PO TABS: 5.0000 mg | ORAL_TABLET | Freq: Once | ORAL | Status: AC | PRN

## 2024-10-05 MED ORDER — FENTANYL CITRATE (PF) 100 MCG/2ML IJ SOLN
25.0000 ug | INTRAMUSCULAR | Status: DC | PRN
  Administered 2024-10-05 (×3): 25 ug via INTRAVENOUS

## 2024-10-05 MED ORDER — ONDANSETRON HCL 4 MG/2ML IJ SOLN: 4.0000 mg | Freq: Four times a day (QID) | INTRAMUSCULAR | Status: DC | PRN

## 2024-10-05 MED ORDER — MIDAZOLAM HCL (PF) 2 MG/2ML IJ SOLN
INTRAMUSCULAR | Status: DC | PRN
  Administered 2024-10-05: 2 mg via INTRAVENOUS

## 2024-10-05 MED ORDER — HEPARIN SODIUM (PORCINE) 1000 UNIT/ML IJ SOLN
INTRAMUSCULAR | Status: DC | PRN
  Administered 2024-10-05: 3000 [IU] via INTRAVENOUS

## 2024-10-05 MED ORDER — ONDANSETRON HCL 4 MG/2ML IJ SOLN
INTRAMUSCULAR | Status: DC | PRN
  Administered 2024-10-05: 4 mg via INTRAVENOUS

## 2024-10-05 MED ORDER — DARBEPOETIN ALFA 60 MCG/0.3ML IJ SOSY
60.0000 ug | PREFILLED_SYRINGE | INTRAMUSCULAR | Status: DC
  Administered 2024-10-05: 60 ug via SUBCUTANEOUS
  Filled 2024-10-05 (×2): qty 0.3

## 2024-10-05 MED ORDER — PHENYLEPHRINE 80 MCG/ML (10ML) SYRINGE FOR IV PUSH (FOR BLOOD PRESSURE SUPPORT)
PREFILLED_SYRINGE | INTRAVENOUS | Status: DC | PRN
  Administered 2024-10-05 (×2): 160 ug via INTRAVENOUS

## 2024-10-05 MED ORDER — PHENYLEPHRINE HCL-NACL 20-0.9 MG/250ML-% IV SOLN
INTRAVENOUS | Status: DC | PRN
  Administered 2024-10-05: 30 ug/min via INTRAVENOUS

## 2024-10-05 MED ORDER — 0.9 % SODIUM CHLORIDE (POUR BTL) OPTIME
TOPICAL | Status: DC | PRN
  Administered 2024-10-05: 1000 mL

## 2024-10-05 MED ORDER — MIDAZOLAM HCL 2 MG/2ML IJ SOLN
INTRAMUSCULAR | Status: AC
  Filled 2024-10-05: qty 2

## 2024-10-05 MED ORDER — SODIUM CHLORIDE 0.9 % IV SOLN: INTRAVENOUS | Status: DC

## 2024-10-05 MED ORDER — HYDROCODONE-ACETAMINOPHEN 5-325 MG PO TABS
1.0000 | ORAL_TABLET | ORAL | Status: DC | PRN
  Administered 2024-10-05 – 2024-10-06 (×4): 2 via ORAL
  Administered 2024-10-07 – 2024-10-08 (×2): 1 via ORAL
  Filled 2024-10-05 (×2): qty 2
  Filled 2024-10-05 (×2): qty 1
  Filled 2024-10-05 (×3): qty 2

## 2024-10-05 MED ORDER — LORAZEPAM 2 MG/ML IJ SOLN
0.5000 mg | Freq: Once | INTRAMUSCULAR | Status: AC
  Administered 2024-10-05: 0.5 mg via INTRAVENOUS
  Filled 2024-10-05: qty 1

## 2024-10-05 MED ORDER — LIDOCAINE HCL (PF) 1 % IJ SOLN
INTRAMUSCULAR | Status: AC
  Filled 2024-10-05: qty 30

## 2024-10-05 MED ORDER — FENTANYL CITRATE (PF) 250 MCG/5ML IJ SOLN
INTRAMUSCULAR | Status: DC | PRN
  Administered 2024-10-05 (×3): 50 ug via INTRAVENOUS

## 2024-10-05 MED ORDER — CEFAZOLIN SODIUM-DEXTROSE 2-4 GM/100ML-% IV SOLN
2.0000 g | INTRAVENOUS | Status: AC
  Administered 2024-10-05: 2 g via INTRAVENOUS

## 2024-10-05 MED ORDER — OXYCODONE HCL 5 MG/5ML PO SOLN
5.0000 mg | Freq: Once | ORAL | Status: AC | PRN
  Administered 2024-10-05: 5 mg via ORAL

## 2024-10-05 MED ORDER — DEXTROSE 50 % IV SOLN
INTRAVENOUS | Status: AC
  Filled 2024-10-05: qty 50

## 2024-10-05 MED ORDER — OXYCODONE HCL 5 MG/5ML PO SOLN
ORAL | Status: AC
  Filled 2024-10-05: qty 5

## 2024-10-05 MED ORDER — DEXAMETHASONE SOD PHOSPHATE PF 10 MG/ML IJ SOLN
INTRAMUSCULAR | Status: DC | PRN
  Administered 2024-10-05: 5 mg via INTRAVENOUS

## 2024-10-05 MED ORDER — SUCCINYLCHOLINE CHLORIDE 200 MG/10ML IV SOSY
PREFILLED_SYRINGE | INTRAVENOUS | Status: DC | PRN
  Administered 2024-10-05: 100 mg via INTRAVENOUS

## 2024-10-05 MED ORDER — HEPARIN 6000 UNIT IRRIGATION SOLUTION
Status: AC
  Filled 2024-10-05: qty 500

## 2024-10-05 MED ORDER — HEPARIN SODIUM (PORCINE) 1000 UNIT/ML IJ SOLN
INTRAMUSCULAR | Status: AC
  Filled 2024-10-05: qty 10

## 2024-10-05 MED ORDER — HEPARIN 6000 UNIT IRRIGATION SOLUTION
Status: DC | PRN
  Administered 2024-10-05: 1

## 2024-10-05 MED ORDER — DEXTROSE 50 % IV SOLN
12.5000 g | INTRAVENOUS | Status: AC
  Administered 2024-10-05: 12.5 g via INTRAVENOUS

## 2024-10-05 MED ORDER — CHLORHEXIDINE GLUCONATE 0.12 % MT SOLN
OROMUCOSAL | Status: AC
  Filled 2024-10-05: qty 15

## 2024-10-05 MED ORDER — LIDOCAINE 2% (20 MG/ML) 5 ML SYRINGE
INTRAMUSCULAR | Status: DC | PRN
  Administered 2024-10-05: 60 mg via INTRAVENOUS

## 2024-10-05 NOTE — Anesthesia Preprocedure Evaluation (Signed)
"                                    Anesthesia Evaluation  Patient identified by MRN, date of birth, ID band Patient awake    Reviewed: Allergy & Precautions, H&P , NPO status , Patient's Chart, lab work & pertinent test results  Airway Mallampati: II   Neck ROM: full    Dental   Pulmonary shortness of breath, asthma , COPD, former smoker   breath sounds clear to auscultation       Cardiovascular hypertension, +CHF   Rhythm:regular Rate:Normal     Neuro/Psych CVA    GI/Hepatic   Endo/Other  diabetes, Type 2    Renal/GU ESRF and DialysisRenal disease     Musculoskeletal   Abdominal   Peds  Hematology  (+) Blood dyscrasia, anemia Hemoglobin 7.8   Anesthesia Other Findings   Reproductive/Obstetrics                              Anesthesia Physical Anesthesia Plan  ASA: 4  Anesthesia Plan: General   Post-op Pain Management:    Induction: Intravenous  PONV Risk Score and Plan: 3 and Ondansetron , Dexamethasone  and Treatment may vary due to age or medical condition  Airway Management Planned: LMA  Additional Equipment:   Intra-op Plan:   Post-operative Plan: Extubation in OR  Informed Consent: I have reviewed the patients History and Physical, chart, labs and discussed the procedure including the risks, benefits and alternatives for the proposed anesthesia with the patient or authorized representative who has indicated his/her understanding and acceptance.     Dental advisory given  Plan Discussed with: CRNA, Anesthesiologist and Surgeon  Anesthesia Plan Comments:         Anesthesia Quick Evaluation  "

## 2024-10-05 NOTE — Assessment & Plan Note (Signed)
 HgB 7.8 this morning, Last dose ESA on 01/25 - Nephrology consulted, appreciate recommendations. - Plan to reinitiate dialysis after tunneled catheter placement by VVS planned for 01/26 - Continue phosphate binder calcium  acetate 667mg  TID with meals - Transfusion threshold <8    - Will plan to transfuse 1 u pRBC during iHD - Vascular surgery Consulted and follow  - OR today for Tunneled catheter placement for iHD - Continue Tylenol  650 mg q6hrs PRN PO or rectal for pain - Avoid nephrotoxic agents - Strict I&Os, daily weights - Daily RFP

## 2024-10-05 NOTE — Op Note (Signed)
 "   OPERATIVE NOTE  DATE: October 05, 2024  PROCEDURE: 1.  Ultrasound-guided access right internal jugular vein 2.  Right internal jugular vein tunneled dialysis catheter (19 cm palindrome) 3.  Left radiocephalic AV fistula  PRE-OPERATIVE DIAGNOSIS: ESRD  POST-OPERATIVE DIAGNOSIS: same  SURGEON: Lonni Gaskins, MD  ASSISTANT(S): Adina Sender, PA  ANESTHESIA: general  ESTIMATED BLOOD LOSS: Minimal  FINDING(S): 1.  Cephalic vein: 4 mm, acceptable 2.  Radial artery: 2.5 mm, atherosclerotic disease evident 3.  Venous outflow: palpable thrill  4.  Radial flow: palpable radial pulse  SPECIMEN(S):  none  INDICATIONS:   Catherine Aguilar is a 69 y.o. female who presents with ESRD and the need for permanent hemodialysis access.  The patient is scheduled for tunneled dialysis catheter placement and left arm AV fistula versus graft.  The patient is aware the risks include but are not limited to: bleeding, infection, steal syndrome, nerve damage, ischemic monomelic neuropathy, failure to mature, and need for additional procedures.  The patient is aware of the risks of the procedure and elects to proceed forward.  An assistant was needed given the complexity the case and also for sewing the arterial anastomosis of the fistula.  DESCRIPTION: After full informed written consent was obtained from the patient, the patient was brought back to the operating room and placed supine upon the operating table.  Prior to induction, the patient received IV antibiotics.   After obtaining adequate anesthesia, the patient was then prepped and draped in the standard fashion for tunneled dialysis catheter in the neck as well as left arm AV fistula versus graft.  An appropriate timeout was performed.  I initially evaluated the right internal jugular vein with ultrasound, it was patent, an image was saved.  The right internal jugular vein was accessed under ultrasound guidance with micro access needle placed a  microwire and a micro sheath under fluoroscopic guidance.  I then advanced a J-wire into the right atrium.  I measured a 19 cm palindrome catheter on the right chest wall.  I then made a incision on the chest wall for the exit site of the catheter and I tunneled the catheter from the chest wall exit site to the IJ stick site.  I then dilated over the wire and then placed a large dilator peel-away sheath into the right atrium over the wire.  The inner wire and dilator were removed.  Then advanced the catheter and peeled the sheath away with the tip of the catheter placed into the right atrium.  I positioned this under fluoroscopy.  It flushed and I made sure it was not kinked under fluoroscopy.  This was loaded with heparin  according to manufactures recommendations.  I closed the neck incision with a 4-0 Monocryl and the catheter exit site was closed with a 2-0 nylon.  Dermabond was placed around the catheter exit site and the neck incision.  I then evaluated the veins in the left upper arm as well as the forearm.  The patient had no upper arm cephalic vein but had a good cephalic vein in the forearm that appeared to drain into the basilic vein.  I elected for left radiocephalic AV fistula.  I made a longitudinal incision at the level of the wrist and dissected through the subcutaneous tissue and fascia to gain exposure of the radial artery.  This was noted to be 2.5 mm in diameter externally.  This was dissected out proximally and distally and controlled with vessel loops .  I then  dissected out the cephalic vein.  This was noted to be 4 mm in diameter externally.  The distal segment of the vein was ligated with a  2-0 silk, and the vein was transected.  The proximal segment was interrogated with serial dilators.  The vein accepted up to a 5.0 mm dilator without any difficulty.  I then instilled the heparinized saline into the vein and clamped it.  At this point, I reset my exposure of the radial artery.  The  patient was given 3,000 units IV heparin .  I then placed the artery under tension proximally and distally.  I made an arteriotomy with a #11 blade, and then I extended the arteriotomy with a Potts scissor.  I injected heparinized saline proximal and distal to this arteriotomy.  The vein was then sewn to the artery in an end-to-side configuration with a running stitch of 6-0 Prolene with the help of my assistant.  Prior to completing this anastomosis, I allowed the vein and artery to backbleed.  There was no evidence of clot from any vessels.  I completed the anastomosis in the usual fashion and then released all vessel loops and clamps.    There was a palpable thrill in the venous outflow, and there was a palpable radial pulse.  At this point, I irrigated out the surgical wound.  There was no further active bleeding.  The subcutaneous tissue was reapproximated with a running stitch of 3-0 Vicryl.  The skin was then reapproximated with a running subcuticular stitch of 4-0 Monocryl.  The skin was then cleaned, dried, and reinforced with Dermabond.  The patient tolerated this procedure well.   COMPLICATIONS: None  CONDITION: Stable  Lonni Gaskins, MD [facility] Office: [phone]  Lonni JINNY Gaskins  10/05/2024, 11:55 AM  "

## 2024-10-05 NOTE — Anesthesia Postprocedure Evaluation (Signed)
"   Anesthesia Post Note  Patient: Airika Vanderpool  Procedure(s) Performed: INSERTION OF LEFT ARM RADIOCEPHALIC  ARTERIOVENOUS FISTULA (Left: Arm Upper) INSERTION OF RIGHT INTERNAL JUGULAR DIALYSIS CATHETER (Right: Neck)     Patient location during evaluation: PACU Anesthesia Type: General Level of consciousness: awake and alert Pain management: pain level controlled Vital Signs Assessment: post-procedure vital signs reviewed and stable Respiratory status: spontaneous breathing, nonlabored ventilation, respiratory function stable and patient connected to nasal cannula oxygen Cardiovascular status: blood pressure returned to baseline and stable Postop Assessment: no apparent nausea or vomiting Anesthetic complications: no   There were no known notable events for this encounter.  Last Vitals:  Vitals:   10/05/24 1255 10/05/24 1313  BP: (!) 130/58 128/65  Pulse: 74   Resp: 18 15  Temp: 36.9 C 37.2 C  SpO2: 91% 93%    Last Pain:  Vitals:   10/05/24 1313  TempSrc: Oral  PainSc:                  Purnell Daigle S      "

## 2024-10-05 NOTE — Assessment & Plan Note (Signed)
 On RA, denies SOB - Continue home Breztri  inhaler 2 puffs daily  - Continue DuoNebs BID scheduled and q2h PRN - Benzonatate  200 mg TID PRN for cough

## 2024-10-05 NOTE — H&P (View-Only) (Signed)
 "                                                            [facility] Infectious Diseases Follow Up Note  Patient Identification: Patient Name: Catherine Aguilar MRN: [id number] Admit Date: 09/25/2024  9:21 PM Age: 69 y.o.Today's Date: 10/05/2024  Reason for Visit: Follow-up on MRSA bacteremia  Principal Problem:   MRSA bacteremia Active Problems:   Essential hypertension   Chronic kidney disease (CKD), stage IV (severe) (HCC)   DM2 (diabetes mellitus, type 2) (HCC)   COPD (chronic obstructive pulmonary disease) (HCC)   Anemia in chronic kidney disease (CKD)   Stage 5 chronic kidney disease not on chronic dialysis (HCC)   Cardiorenal syndrome with renal failure   Volume overload   Chronic health problem   Elevated d-dimer   Elevated troponin   Demand ischemia (HCC)   Acute on chronic congestive heart failure (HCC)   Multifocal pneumonia   Shortness of breath   Chronic superficial vein thrombosis involving L small saphenous vein   Bloodstream infection due to central venous catheter   ESRD on hemodialysis (HCC)   Aspiration pneumonia of both lungs (HCC)   Left vocal cord paralysis   Secondary hyperparathyroidism   CAD (coronary artery disease)   Antibiotics:  Vancomycin  with HD 1/19 Augmentin  1/20-  Lines/Hardwares: Right IJ tunneled HDC  Interval Events: Remains afebrile Labs remarkable for WBC at 11, hemoglobin 7.8  Assessment 69 year old female with prior history as below including type II DM, CKD, COPD, WG with renal involvement, CKD initially admitted for CHF exacerbation secondary to progression of CKD to ESRD in the setting of medication nonadherence.  Hospital course complicated with MRSA bacteremia secondary to temporary HD catheter as well as bilateral vocal cord paralysis.  # MRSA bacteremia/CLABSI - Blood cx 1/21 all sets and 1/23 NGTD - 1/18 TTE no vegetations/endocarditis  - HDC removed on 1/21 - On HDC placed today  # Left vocal cord paralysis - Evaluated  by ENT , CT neck with contrast once patient is able to resume dialysis  Recommendations - Continue vancomycin  with HD - s/p 5 days of Augmentin  - Follow-up TEE - Monitor CBC and BMP - Monitor for metastatic sites of infection - Contact precautions  Following   Rest of the management as per the primary team. Thank you for the consult. Please page with pertinent questions or concerns.  ______________________________________________________________________ Subjective patient seen and examined at the bedside.  She is hungry and wants to eat.  Seen after HD catheter placement.  No other concerns.    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   CVA (cerebral vascular accident) (HCC) 07/03/2012   Diffuse pulmonary alveolar hemorrhage    Edema of right lower extremity 03/25/2017   Elevated d-dimer 09/26/2024   Essential hypertension 12/08/2016   Hemoptysis 12/08/2016   High cholesterol    HLD (hyperlipidemia) 12/08/2016  Hx of adenomatous polyp of colon 04/16/2017   Hypertension    stopped taking medications 10 years ago   Hypokalemia 07/02/2012   Intra-alveolar hemorrhage 12/08/2016   Left facial numbness 07/02/2012   Sepsis (HCC) 12/08/2016   Shortness of breath 12/02/2017   Stroke (HCC)    Substance abuse (HCC) 12/08/2016   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   Tobacco abuse 12/08/2016   Type 2 diabetes mellitus without complication, with long-term current use of insulin  (HCC) 02/11/2017   Wegener's granulomatosis with renal involvement (HCC) 12/08/2016   Past Surgical  History:  Procedure Laterality Date   ABDOMINAL HYSTERECTOMY  1985   IR TUNNELED CENTRAL VENOUS CATH PLC W IMG  09/26/2024   Vitals BP 128/65 (BP Location: Right Arm)   Pulse 74   Temp 98.9 F (37.2 C) (Oral)   Resp 15   Ht 5' 7 (1.702 m)   Wt 80.4 kg   SpO2 93%   BMI 27.76 kg/m     Physical Exam Constitutional: Elderly female lying in the bed, nontoxic-appearing    Comments: HEENT WNL  Cardiovascular:     Rate and Rhythm: Normal rate    Heart sounds: S1S2  Pulmonary:     Effort: Pulmonary effort is normal.     Comments: Normal breath sounds  Abdominal:     Palpations: Abdomen is soft.     Tenderness: Nondistended and nontender  Musculoskeletal:        General: No swelling or tenderness in peripheral joints  Skin:    Comments: Right chest HDC with bandage intact  Neurological:     General: Awake, alert and oriented  Psychiatric:        Mood and Affect: Mood normal.   Pertinent Microbiology Results for orders placed or performed during the hospital encounter of 09/25/24  Resp panel by RT-PCR (RSV, Flu A&B, Covid) Anterior Nasal Swab     Status: None   Collection Time: 09/25/24  9:34 PM   Specimen: Anterior Nasal Swab  Result Value Ref Range Status   SARS Coronavirus 2 by RT PCR NEGATIVE NEGATIVE Final   Influenza A by PCR NEGATIVE NEGATIVE Final   Influenza B by PCR NEGATIVE NEGATIVE Final    Comment: (NOTE) The Xpert Xpress SARS-CoV-2/FLU/RSV plus assay is intended as an aid in the diagnosis of influenza from Nasopharyngeal swab specimens and should not be used as a sole basis for treatment. Nasal washings and aspirates are unacceptable for Xpert Xpress SARS-CoV-2/FLU/RSV testing.  Fact Sheet for Patients: bloggercourse.com  Fact Sheet for Healthcare Providers: seriousbroker.it  This test is not yet approved or cleared by the [place]  FDA and has been authorized for detection and/or diagnosis of  SARS-CoV-2 by FDA under an Emergency Use Authorization (EUA). This EUA will remain in effect (meaning this test can be used) for the duration of the COVID-19 declaration under Section 564(b)(1) of the Act, 21 U.S.C. section 360bbb-3(b)(1), unless the authorization is terminated or revoked.     Resp Syncytial Virus by PCR NEGATIVE NEGATIVE Final    Comment: (NOTE) Fact Sheet for Patients: bloggercourse.com  Fact Sheet for Healthcare Providers: seriousbroker.it  This test is not yet approved or cleared by the [place]  FDA and has been authorized for detection and/or diagnosis of SARS-CoV-2 by FDA under an Emergency Use Authorization (EUA). This EUA will remain in effect (meaning this test can be used) for the duration of the COVID-19 declaration under Section 564(b)(1) of the Act, 21 U.S.C. section 360bbb-3(b)(1), unless the authorization  is terminated or revoked.  Performed at [facility], [address] [place], [place] [address]   Respiratory (~20 pathogens) panel by PCR     Status: None   Collection Time: 09/28/24  3:40 PM   Specimen: Nasopharyngeal Swab; Respiratory  Result Value Ref Range Status   Adenovirus NOT DETECTED NOT DETECTED Final   Coronavirus 229E NOT DETECTED NOT DETECTED Final    Comment: (NOTE) The Coronavirus on the Respiratory Panel, DOES NOT test for the novel  Coronavirus (2019 nCoV)    Coronavirus HKU1 NOT DETECTED NOT DETECTED Final   Coronavirus NL63 NOT DETECTED NOT DETECTED Final   Coronavirus OC43 NOT DETECTED NOT DETECTED Final   Metapneumovirus NOT DETECTED NOT DETECTED Final   Rhinovirus / Enterovirus NOT DETECTED NOT DETECTED Final   Influenza A NOT DETECTED NOT DETECTED Final   Influenza B NOT DETECTED NOT DETECTED Final   Parainfluenza Virus 1 NOT DETECTED NOT DETECTED Final   Parainfluenza Virus 2 NOT DETECTED NOT DETECTED Final   Parainfluenza Virus 3 NOT DETECTED NOT  DETECTED Final   Parainfluenza Virus 4 NOT DETECTED NOT DETECTED Final   Respiratory Syncytial Virus NOT DETECTED NOT DETECTED Final   Bordetella pertussis NOT DETECTED NOT DETECTED Final   Bordetella Parapertussis NOT DETECTED NOT DETECTED Final   Chlamydophila pneumoniae NOT DETECTED NOT DETECTED Final   Mycoplasma pneumoniae NOT DETECTED NOT DETECTED Final    Comment: Performed at [facility], [address] [place], [place] [address]  Culture, blood (Routine X 2) w Reflex to ID Panel     Status: Abnormal   Collection Time: 09/28/24  7:00 PM   Specimen: BLOOD  Result Value Ref Range Status   Specimen Description BLOOD SITE NOT SPECIFIED  Final   Special Requests   Final    BOTTLES DRAWN AEROBIC AND ANAEROBIC Blood Culture adequate volume   Culture  Setup Time   Final    GRAM POSITIVE COCCI AEROBIC BOTTLE ONLY CRITICAL RESULT CALLED TO, READ BACK BY AND VERIFIED WITH: PHARMD Damien RAMAN on 012026 @1425  by SM Performed at [facility], [address] [place], [place] [address]    Culture METHICILLIN RESISTANT STAPHYLOCOCCUS AUREUS (A)  Final   Report Status 10/01/2024 FINAL  Final   Organism ID, Bacteria METHICILLIN RESISTANT STAPHYLOCOCCUS AUREUS  Final      Susceptibility   Methicillin resistant staphylococcus aureus - MIC*    CIPROFLOXACIN >=8 RESISTANT Resistant     ERYTHROMYCIN >=8 RESISTANT Resistant     GENTAMICIN <=0.5 SENSITIVE Sensitive     OXACILLIN >=4 RESISTANT Resistant     TETRACYCLINE <=1 SENSITIVE Sensitive     VANCOMYCIN  <=0.5 SENSITIVE Sensitive     TRIMETH /SULFA  >=320 RESISTANT Resistant     CLINDAMYCIN >=8 RESISTANT Resistant     RIFAMPIN <=0.5 SENSITIVE Sensitive     Inducible Clindamycin NEGATIVE Sensitive     LINEZOLID 2 SENSITIVE Sensitive     * METHICILLIN RESISTANT STAPHYLOCOCCUS AUREUS  Blood Culture ID Panel (Reflexed)     Status: Abnormal   Collection Time: 09/28/24  7:00 PM  Result Value Ref Range Status   Enterococcus faecalis NOT  DETECTED NOT DETECTED Final   Enterococcus Faecium NOT DETECTED NOT DETECTED Final   Listeria monocytogenes NOT DETECTED NOT DETECTED Final   Staphylococcus species DETECTED (A) NOT DETECTED Final    Comment: CRITICAL RESULT CALLED TO, READ BACK BY AND VERIFIED WITH: PHARMD Damien RAMAN on 012026 @1425  by SM    Staphylococcus  aureus (BCID) DETECTED (A) NOT DETECTED Final    Comment: Methicillin (oxacillin)-resistant Staphylococcus aureus (MRSA). MRSA is predictably resistant to beta-lactam antibiotics (except ceftaroline). Preferred therapy is vancomycin  unless clinically contraindicated. Patient requires contact precautions if  hospitalized. CRITICAL RESULT CALLED TO, READ BACK BY AND VERIFIED WITH: PHARMD Damien RAMAN on 012026 @1425  by SM    Staphylococcus epidermidis NOT DETECTED NOT DETECTED Final   Staphylococcus lugdunensis NOT DETECTED NOT DETECTED Final   Streptococcus species NOT DETECTED NOT DETECTED Final   Streptococcus agalactiae NOT DETECTED NOT DETECTED Final   Streptococcus pneumoniae NOT DETECTED NOT DETECTED Final   Streptococcus pyogenes NOT DETECTED NOT DETECTED Final   A.calcoaceticus-baumannii NOT DETECTED NOT DETECTED Final   Bacteroides fragilis NOT DETECTED NOT DETECTED Final   Enterobacterales NOT DETECTED NOT DETECTED Final   Enterobacter cloacae complex NOT DETECTED NOT DETECTED Final   Escherichia coli NOT DETECTED NOT DETECTED Final   Klebsiella aerogenes NOT DETECTED NOT DETECTED Final   Klebsiella oxytoca NOT DETECTED NOT DETECTED Final   Klebsiella pneumoniae NOT DETECTED NOT DETECTED Final   Proteus species NOT DETECTED NOT DETECTED Final   Salmonella species NOT DETECTED NOT DETECTED Final   Serratia marcescens NOT DETECTED NOT DETECTED Final   Haemophilus influenzae NOT DETECTED NOT DETECTED Final   Neisseria meningitidis NOT DETECTED NOT DETECTED Final   Pseudomonas aeruginosa NOT DETECTED NOT DETECTED Final   Stenotrophomonas maltophilia NOT DETECTED NOT  DETECTED Final   Candida albicans NOT DETECTED NOT DETECTED Final   Candida auris NOT DETECTED NOT DETECTED Final   Candida glabrata NOT DETECTED NOT DETECTED Final   Candida krusei NOT DETECTED NOT DETECTED Final   Candida parapsilosis NOT DETECTED NOT DETECTED Final   Candida tropicalis NOT DETECTED NOT DETECTED Final   Cryptococcus neoformans/gattii NOT DETECTED NOT DETECTED Final   Meth resistant mecA/C and MREJ DETECTED (A) NOT DETECTED Final    Comment: CRITICAL RESULT CALLED TO, READ BACK BY AND VERIFIED WITH: PHARMD Damien RAMAN on 012026 @1425  by SM Performed at [facility], [address] [place], [place] [address]   MIC (1 Drug)-     Status: Abnormal   Collection Time: 09/28/24  7:00 PM  Result Value Ref Range Status   Min Inhibitory Conc (1 Drug) Preliminary report (A)  Final    Comment: (NOTE) Performed At: [facility] [address] [place], [place] [id number] Jennette Shorter MD Ey:[id number]    Source LAB [phone] MRSA BLOOD DAPTOMYCIN  Final    Comment: Performed at [facility], [address] [place], [place] [address]  MIC Result     Status: Abnormal   Collection Time: 09/28/24  7:00 PM  Result Value Ref Range Status   Result 1 (MIC) Comment (A)  Final    Comment: (NOTE) Methicillin - resistant Staphylococcus aureus Identification performed by account, not confirmed by this laboratory. DAPTOMYCIN Performed At: [facility] [address] [place], [place] [id number] Jennette Shorter MD Ey:[id number]   Culture, blood (Routine X 2) w Reflex to ID Panel     Status: Abnormal   Collection Time: 09/28/24  7:02 PM   Specimen: BLOOD  Result Value Ref Range Status   Specimen Description BLOOD SITE NOT SPECIFIED  Final   Special Requests   Final    BOTTLES DRAWN AEROBIC AND ANAEROBIC Blood Culture adequate volume   Culture  Setup Time   Final    GRAM POSITIVE COCCI IN CLUSTERS ANAEROBIC BOTTLE ONLY CRITICAL VALUE NOTED.  VALUE IS  CONSISTENT  WITH PREVIOUSLY REPORTED AND CALLED VALUE.    Culture (A)  Final    STAPHYLOCOCCUS AUREUS SUSCEPTIBILITIES PERFORMED ON PREVIOUS CULTURE WITHIN THE LAST 5 DAYS. Performed at [facility], [address] [place], [place] [address]    Report Status 10/01/2024 FINAL  Final  MRSA Next Gen by PCR, Nasal     Status: Abnormal   Collection Time: 09/29/24  7:13 AM   Specimen: Nasal Mucosa; Nasal Swab  Result Value Ref Range Status   MRSA by PCR Next Gen DETECTED (A) NOT DETECTED Final    Comment: RESULT CALLED TO, READ BACK BY AND VERIFIED WITH: RN CHRISTELLA HALEY [id number] AT 1136 BY CM (NOTE) The GeneXpert MRSA Assay (FDA approved for NASAL specimens only), is one component of a comprehensive MRSA colonization surveillance program. It is not intended to diagnose MRSA infection nor to guide or monitor treatment for MRSA infections. Test performance is not FDA approved in patients less than 76 years old. Performed at [facility], [address], [place], [place] [address]   Culture, blood (Routine X 2) w Reflex to ID Panel     Status: None (Preliminary result)   Collection Time: 09/30/24  7:16 AM   Specimen: BLOOD RIGHT ARM  Result Value Ref Range Status   Specimen Description BLOOD RIGHT ARM  Final   Special Requests   Final    BOTTLES DRAWN AEROBIC AND ANAEROBIC Blood Culture adequate volume   Culture   Final    NO GROWTH 4 DAYS Performed at [facility], [address], [place], [place] [address]    Report Status PENDING  Incomplete  Culture, blood (Routine X 2) w Reflex to ID Panel     Status: None (Preliminary result)   Collection Time: 09/30/24  7:25 AM   Specimen: BLOOD RIGHT ARM  Result Value Ref Range Status   Specimen Description BLOOD RIGHT ARM  Final   Special Requests   Final    BOTTLES DRAWN AEROBIC AND ANAEROBIC Blood Culture adequate volume   Culture   Final    NO GROWTH 4 DAYS Performed at [facility], [address] [place], [place] [address]     Report Status PENDING  Incomplete  Culture, blood (Routine X 2) w Reflex to ID Panel     Status: None (Preliminary result)   Collection Time: 09/30/24 12:12 PM   Specimen: BLOOD RIGHT HAND  Result Value Ref Range Status   Specimen Description BLOOD RIGHT HAND  Final   Special Requests   Final    BOTTLES DRAWN AEROBIC AND ANAEROBIC Blood Culture adequate volume   Culture   Final    NO GROWTH 4 DAYS Performed at [facility], [address] [place], [place] [address]    Report Status PENDING  Incomplete  Culture, blood (Routine X 2) w Reflex to ID Panel     Status: None (Preliminary result)   Collection Time: 09/30/24 12:14 PM   Specimen: BLOOD LEFT HAND  Result Value Ref Range Status   Specimen Description BLOOD LEFT HAND  Final   Special Requests   Final    BOTTLES DRAWN AEROBIC AND ANAEROBIC Blood Culture adequate volume   Culture   Final    NO GROWTH 4 DAYS Performed at [facility], [address], [place], [place] [address]    Report Status PENDING  Incomplete  Culture, blood (Routine X 2) w Reflex to ID Panel  Status: None (Preliminary result)   Collection Time: 10/02/24  9:48 PM   Specimen: BLOOD  Result Value Ref Range Status   Specimen Description BLOOD LEFT ANTECUBITAL  Final   Special Requests   Final    BOTTLES DRAWN AEROBIC AND ANAEROBIC Blood Culture adequate volume   Culture   Final    NO GROWTH 2 DAYS Performed at [facility], [address], [place], [place] [address]    Report Status PENDING  Incomplete  Culture, blood (Routine X 2) w Reflex to ID Panel     Status: None (Preliminary result)   Collection Time: 10/02/24  9:59 PM   Specimen: BLOOD LEFT HAND  Result Value Ref Range Status   Specimen Description BLOOD LEFT HAND  Final   Special Requests   Final    BOTTLES DRAWN AEROBIC AND ANAEROBIC Blood Culture adequate volume   Culture   Final    NO GROWTH 2 DAYS Performed at [facility], [address] [place],  [place] [address]    Report Status PENDING  Incomplete   Pertinent Lab.    Latest Ref Rng & Units 10/05/2024    1:22 AM 10/04/2024    3:15 AM 10/03/2024    3:15 AM  CBC  WBC 4.0 - 10.5 K/uL 11.0  12.9  18.2   Hemoglobin 12.0 - 15.0 g/dL 7.8  7.3  7.4   Hematocrit 36.0 - 46.0 % 23.7  22.1  22.5   Platelets 150 - 400 K/uL 272  228  198       Latest Ref Rng & Units 10/05/2024    5:16 AM 10/04/2024    3:15 AM 10/03/2024    3:15 AM  CMP  Glucose 70 - 99 mg/dL 70  89  95   BUN 8 - 23 mg/dL 893  96  91   Creatinine 0.44 - 1.00 mg/dL 87.99  89.09  89.79   Sodium 135 - 145 mmol/L 133  130  131   Potassium 3.5 - 5.1 mmol/L 4.4  3.8  3.6   Chloride 98 - 111 mmol/L 90  89  90   CO2 22 - 32 mmol/L 17  19  17    Calcium  8.9 - 10.3 mg/dL 9.1  8.7  8.5      Pertinent Imaging today Plain films and CT images have been personally visualized and interpreted; radiology reports have been reviewed. Decision making incorporated into the Impression /   VAS US  LOWER EXTREMITY VENOUS (DVT) Result Date: 09/29/2024  Lower Venous DVT Study Patient Name:  Catherine Aguilar  Date of Exam:   09/29/2024 Medical Rec #: [id number]        Accession #:    [id number] Date of Birth: 1956/03/18        Patient Gender: F Patient Age:   64 years Exam Location:  [facility] Procedure:      VAS US  LOWER EXTREMITY VENOUS (DVT) Referring Phys: LAMAR PATERSON --------------------------------------------------------------------------------  Indications: Edema, and SOB. Other Indications: Prior CVA. Limitations: Limited exam due to patient's uncooperativeness. Comparison Study: Previous study of the right lower extremity on 7.18.2018. Performing Technologist: Edilia Elden Appl  Examination Guidelines: A complete evaluation includes B-mode imaging, spectral Doppler, color Doppler, and power Doppler as needed of all accessible portions of each vessel. Bilateral testing is considered an integral part of a complete examination. Limited  examinations for reoccurring indications may be performed as noted. The reflux portion of the exam is performed with the patient  in reverse Trendelenburg.  +---------+---------------+---------+-----------+----------+--------------+ RIGHT    CompressibilityPhasicitySpontaneityPropertiesThrombus Aging +---------+---------------+---------+-----------+----------+--------------+ CFV      Full           No       Yes                                 +---------+---------------+---------+-----------+----------+--------------+ SFJ      Full           No       Yes                                 +---------+---------------+---------+-----------+----------+--------------+ FV Prox  Full                                                        +---------+---------------+---------+-----------+----------+--------------+ FV Mid   Full                                                        +---------+---------------+---------+-----------+----------+--------------+ FV DistalFull                                                        +---------+---------------+---------+-----------+----------+--------------+ PFV      Full                                                        +---------+---------------+---------+-----------+----------+--------------+ POP      Full           No       Yes                                 +---------+---------------+---------+-----------+----------+--------------+ PTV      Full                                                        +---------+---------------+---------+-----------+----------+--------------+ PERO     Full                                                        +---------+---------------+---------+-----------+----------+--------------+   +---------+---------------+---------+-----------+----------+--------------+ LEFT     CompressibilityPhasicitySpontaneityPropertiesThrombus Aging  +---------+---------------+---------+-----------+----------+--------------+ CFV      Full           No       Yes                                 +---------+---------------+---------+-----------+----------+--------------+  SFJ      Full           No       Yes                                 +---------+---------------+---------+-----------+----------+--------------+ FV Prox  Full                                                        +---------+---------------+---------+-----------+----------+--------------+ FV Mid   Full                                                        +---------+---------------+---------+-----------+----------+--------------+ FV DistalFull                                                        +---------+---------------+---------+-----------+----------+--------------+ PFV      Full                                                        +---------+---------------+---------+-----------+----------+--------------+ POP      Full           No       Yes                                 +---------+---------------+---------+-----------+----------+--------------+ PTV      Full                                                        +---------+---------------+---------+-----------+----------+--------------+ PERO     Full                                                        +---------+---------------+---------+-----------+----------+--------------+ SSV      Full           No       Yes                  Chronic        +---------+---------------+---------+-----------+----------+--------------+ Chronic calcified thrombus noted in the left small saphenous vein.    Summary: RIGHT: - There is no evidence of deep vein thrombosis in the lower extremity.  - No cystic structure found in the popliteal fossa.  LEFT: - Findings consistent with chronic superficial vein thrombosis involving the left small saphenous vein.  - No cystic structure found in  the popliteal fossa.  *  See table(s) above for measurements and observations. Electronically signed by Gaile New MD on 09/29/2024 at 12:45:02 PM.    Final    VAS US  UPPER EXT VEIN MAPPING (PRE-OP  AVF) Result Date: 09/29/2024 UPPER EXTREMITY VEIN MAPPING Patient Name:  Catherine Aguilar  Date of Exam:   09/29/2024 Medical Rec #: [id number]        Accession #:    [id number] Date of Birth: 1956-01-08        Patient Gender: F Patient Age:   78 years Exam Location:  [facility] Procedure:      VAS US  UPPER EXT VEIN MAPPING (PRE-OP  AVF) Referring Phys: GAILE NEW --------------------------------------------------------------------------------  Indications: Pre-access. History: ESRD, volume overload.  Limitations: Limited exam due to patient's uncooperativeness, refused to              continue with exam. Comparison Study: No prior exam. Performing Technologist: Edilia Elden Appl  Examination Guidelines: A complete evaluation includes B-mode imaging, spectral Doppler, color Doppler, and power Doppler as needed of all accessible portions of each vessel. Bilateral testing is considered an integral part of a complete examination. Limited examinations for reoccurring indications may be performed as noted. +-----------------+-------------+----------+--------------+ Right Cephalic   Diameter (cm)Depth (cm)   Findings    +-----------------+-------------+----------+--------------+ Shoulder             0.17        0.80                  +-----------------+-------------+----------+--------------+ Prox upper arm                          not visualized +-----------------+-------------+----------+--------------+ Mid upper arm                           not visualized +-----------------+-------------+----------+--------------+ Dist upper arm                          not visualized +-----------------+-------------+----------+--------------+ Antecubital fossa    0.45        0.55      Thrombus     +-----------------+-------------+----------+--------------+ Prox forearm         0.49        0.43      Thrombus    +-----------------+-------------+----------+--------------+ Mid forearm          0.51        0.36                  +-----------------+-------------+----------+--------------+ Dist forearm         0.19        0.11                  +-----------------+-------------+----------+--------------+ Wrist                0.15        0.13                  +-----------------+-------------+----------+--------------+ +-----------------+-------------+----------+---------+ Right Basilic    Diameter (cm)Depth (cm)Findings  +-----------------+-------------+----------+---------+ Mid upper arm        0.53               branching +-----------------+-------------+----------+---------+ Dist upper arm       0.47               branching +-----------------+-------------+----------+---------+ Antecubital fossa    0.35                         +-----------------+-------------+----------+---------+  Prox forearm         0.17                         +-----------------+-------------+----------+---------+ Mid forearm          0.17                         +-----------------+-------------+----------+---------+ Distal forearm       0.19                         +-----------------+-------------+----------+---------+ Elbow                0.29                         +-----------------+-------------+----------+---------+ Wrist                0.16                         +-----------------+-------------+----------+---------+ +-----------------+-------------+----------+--------------+ Left Cephalic    Diameter (cm)Depth (cm)   Findings    +-----------------+-------------+----------+--------------+ Shoulder             0.21        2.00                  +-----------------+-------------+----------+--------------+ Prox upper arm       0.12        0.73                   +-----------------+-------------+----------+--------------+ Mid upper arm                           not visualized +-----------------+-------------+----------+--------------+ Dist upper arm                          not visualized +-----------------+-------------+----------+--------------+ Antecubital fossa    0.69        0.35                  +-----------------+-------------+----------+--------------+ Prox forearm         0.48        0.16                  +-----------------+-------------+----------+--------------+ Mid forearm          0.31        0.20                  +-----------------+-------------+----------+--------------+ Dist forearm         0.27        0.14                  +-----------------+-------------+----------+--------------+ Wrist                0.16        0.16                  +-----------------+-------------+----------+--------------+ +--------------+-------------+----------+--------+ Left Basilic  Diameter (cm)Depth (cm)Findings +--------------+-------------+----------+--------+ Mid upper arm     0.80                        +--------------+-------------+----------+--------+ Dist upper arm    0.46                        +--------------+-------------+----------+--------+  Patient refused to continue the exam, left upper extremity is limited. *See table(s) above for measurements and observations.  Diagnosing physician: Gaile New MD Electronically signed by Gaile New MD on 09/29/2024 at 12:44:30 PM.    Final    CT Angio Chest Pulmonary Embolism (PE) W or WO Contrast Result Date: 09/28/2024 EXAM: CTA CHEST 09/28/2024 06:48:36 PM TECHNIQUE: CTA of the chest was performed after the administration of intravenous contrast. Multiplanar reformatted images are provided for review. MIP images are provided for review. Automated exposure control, iterative reconstruction, and/or weight based adjustment of the mA/kV was utilized to reduce the radiation  dose to as low as reasonably achievable. COMPARISON: Chest CT 07/09/2024. CLINICAL HISTORY: Pulmonary embolism (PE) suspected, low to intermediate prob, positive D-dimer. FINDINGS: PULMONARY ARTERIES: Pulmonary arteries are adequately opacified for evaluation. No acute pulmonary embolus. Main pulmonary artery is normal in caliber. MEDIASTINUM: The heart is enlarged. Coronary atherosclerotic calcifications are noted. There is a stable small pericardial effusion. Aortic atherosclerotic calcifications are noted. There is a 6 mm hypodense isthmus nodule. No aortic dissection, intramural hematoma, aortic ulceration, or rupture. LYMPH NODES: No mediastinal, hilar or axillary lymphadenopathy. LUNGS AND PLEURA: Mild emphysema present. Airspace consolidation in the left lower lobe inferiorly and also minimally within the lingula. Additional tree in bud opacities in the inferior left upper lobe. Secretions in the left lower lobe bronchus. No evidence of pleural effusion or pneumothorax. UPPER ABDOMEN: Cysts in the kidneys. SOFT TISSUES AND BONES: Healed right sided 7th and 8th rib fractures. No acute soft tissue abnormality. IMPRESSION: 1. No evidence of pulmonary embolism. 2. Airspace consolidation in the left lower lobe and inferior left upper lobe compatible with multifocal pneumonia. There are secretions in the left lower lobe bronchus concerning for aspiration. 3. Mild emphysema; pulmonary emphysema is an independent risk factor for lung cancer, recommend consideration for evaluation for a low-dose CT lung cancer screening program. Electronically signed by: Greig Pique MD 09/28/2024 07:03 PM EST RP Workstation: [id number]   DG Chest Port 1 View Result Date: 09/28/2024 CLINICAL DATA:  Acute respiratory distress. EXAM: PORTABLE CHEST 1 VIEW COMPARISON:  September 25, 2024 FINDINGS: Since the prior study there is been interval placement of a right-sided venous catheter with its distal tip noted at the junction of the  superior vena cava and right atrium. The cardiac silhouette is enlarged and unchanged in size. There is prominence of the central pulmonary vasculature. Mild diffusely increased interstitial lung markings are also noted. Mild to moderate severity atelectasis and/or infiltrate is suspected within the retrocardiac region of the left lung base. A small left pleural effusion is also noted. No pneumothorax is identified. Chronic right rib fractures are present. IMPRESSION: 1. Cardiomegaly with mild pulmonary vascular congestion and mild interstitial edema. 2. Mild to moderate severity left basilar atelectasis and/or infiltrate. 3. Small left pleural effusion. Electronically Signed   By: Suzen Dials M.D.   On: 09/28/2024 14:19   IR NON-TUNNELED CENTRAL VENOUS CATH [facility] W IMG Result Date: 09/27/2024 INDICATION: 69 year old female with history of renal failure requiring central venous access for initiation of hemodialysis. EXAM: NON-TUNNELED CENTRAL VENOUS HEMODIALYSIS CATHETER PLACEMENT WITH ULTRASOUND AND FLUOROSCOPIC GUIDANCE COMPARISON:  None Available. MEDICATIONS: None FLUOROSCOPY TIME:  Five mGy reference air kerma COMPLICATIONS: None immediate. PROCEDURE: Informed written consent was obtained from the patient after a discussion of the risks, benefits, and alternatives to treatment. Questions regarding the procedure were encouraged and answered. The right neck and chest were prepped with chlorhexidine  in a sterile fashion, and  a sterile drape was applied covering the operative field. Maximum barrier sterile technique with sterile gowns and gloves were used for the procedure. A timeout was performed prior to the initiation of the procedure. After the overlying soft tissues were anesthetized, a small venotomy incision was created and a micropuncture kit was utilized to access the internal jugular vein. Real-time ultrasound guidance was utilized for vascular access including the acquisition of a permanent  ultrasound image documenting patency of the accessed vessel. The microwire was utilized to measure appropriate catheter length. A guidewire was advanced to the level of the IVC. Under fluoroscopic guidance, the venotomy was serially dilated, ultimately allowing placement of a 16 cm temporary Trialysis catheter with tip ultimately terminating within the superior aspect of the right atrium. Final catheter positioning was confirmed and documented with a spot radiographic image. The catheter aspirates and flushes normally. The catheter was flushed with appropriate volume heparin  dwells. The catheter exit site was secured with a 0 silk retention suture. A dressing was placed. The patient tolerated the procedure well without immediate post procedural complication. IMPRESSION: Successful placement of a right internal jugular approach 16 cm temporary dialysis catheter with tip terminating with in the superior aspect of the right atrium. The catheter is ready for immediate use. PLAN: This catheter may be converted to a tunneled dialysis catheter at a later date as indicated. Ester Sides, MD [facility] Radiology Electronically Signed   By: Ester Sides M.D.   On: 09/27/2024 14:01   ECHOCARDIOGRAM COMPLETE Result Date: 09/27/2024    ECHOCARDIOGRAM REPORT   Patient Name:   Catherine Aguilar Date of Exam: 09/27/2024 Medical Rec #:  [id number]       Height:       67.0 in Accession #:    [id number]      Weight:       180.8 lb Date of Birth:  1955/10/04       BSA:          1.937 m Patient Age:    68 years        BP:           146/66 mmHg Patient Gender: F               HR:           65 bpm. Exam Location:  Inpatient Procedure: 2D Echo, Cardiac Doppler, Color Doppler and Strain Analysis (Both            Spectral and Color Flow Doppler were utilized during procedure). Indications:    CHF- Acute Systolic I50.21  History:        Patient has prior history of Echocardiogram examinations,  most                 recent 12/11/2023. CHF, COPD and Stroke; Risk Factors:Diabetes,                 CKD, Dyslipidemia and Hypertension.  Sonographer:    Koleen Popper RDCS Referring Phys: TWYLA NEARING  Sonographer Comments: Image acquisition challenging due to respiratory motion. IMPRESSIONS  1. Left ventricular ejection fraction, by estimation, is 70 to 75%. The left ventricle has hyperdynamic function. The left ventricle has no regional wall motion abnormalities. There is severe asymmetric left ventricular hypertrophy of the septal segment. Turbulent flow/flow accerelation in the LVOT likjely from a combination of severe LVH and hyperdynamic LV function. Left ventricular diastolic parameters are consistent with Grade I diastolic dysfunction (impaired relaxation). Elevated  left ventricular end-diastolic pressure. The average left ventricular global longitudinal strain is -17.6 %. The global longitudinal strain is normal.  2. Right ventricular systolic function is normal. The right ventricular size is moderately enlarged. There is mildly elevated pulmonary artery systolic pressure. The estimated right ventricular systolic pressure is 36.9 mmHg.  3. Left atrial size was mildly dilated.  4. Right atrial size was mild to moderately dilated.  5. The mitral valve is grossly normal. No evidence of mitral valve regurgitation. No evidence of mitral stenosis.  6. The aortic valve was not well visualized. Aortic valve regurgitation is not visualized. No aortic stenosis is present.  7. The inferior vena cava is dilated in size with >50% respiratory variability, suggesting right atrial pressure of 8 mmHg.  8. Increased flow velocities may be secondary to anemia, thyrotoxicosis, hyperdynamic or high flow state.  9. Small to moderate pericardial effusion is located posterior and lateral to the left ventricle. FINDINGS  Left Ventricle: Left ventricular ejection fraction, by estimation, is 70 to 75%. The left ventricle has  hyperdynamic function. The left ventricle has no regional wall motion abnormalities. The average left ventricular global longitudinal strain is -17.6  %. Strain was performed and the global longitudinal strain is normal. The left ventricular internal cavity size was normal in size. There is severe asymmetric left ventricular hypertrophy of the septal segment. Left ventricular diastolic parameters are consistent with Grade I diastolic dysfunction (impaired relaxation). Elevated left ventricular end-diastolic pressure. Right Ventricle: The right ventricular size is moderately enlarged. No increase in right ventricular wall thickness. Right ventricular systolic function is normal. There is mildly elevated pulmonary artery systolic pressure. The tricuspid regurgitant velocity is 2.69 m/s, and with an assumed right atrial pressure of 8 mmHg, the estimated right ventricular systolic pressure is 36.9 mmHg. Left Atrium: Left atrial size was mildly dilated. Right Atrium: Right atrial size was mild to moderately dilated. Pericardium: Small to moderate. The pericardial effusion is posterior and lateral to the left ventricle. Mitral Valve: The mitral valve is grossly normal. Mild to moderate mitral annular calcification. No evidence of mitral valve regurgitation. No evidence of mitral valve stenosis. Tricuspid Valve: The tricuspid valve is grossly normal. Tricuspid valve regurgitation is trivial. No evidence of tricuspid stenosis. Aortic Valve: The aortic valve was not well visualized. Aortic valve regurgitation is not visualized. No aortic stenosis is present. Pulmonic Valve: The pulmonic valve was not well visualized. Pulmonic valve regurgitation is not visualized. No evidence of pulmonic stenosis. Aorta: The aortic root is normal in size and structure. Venous: The inferior vena cava is dilated in size with greater than 50% respiratory variability, suggesting right atrial pressure of 8 mmHg. IAS/Shunts: No atrial level shunt  detected by color flow Doppler. Additional Comments: 3D was performed not requiring image post processing on an independent workstation and was indeterminate.  LEFT VENTRICLE PLAX 2D LVIDd:         5.10 cm      Diastology LVIDs:         2.90 cm      LV e' medial:    5.77 cm/s LV PW:         1.40 cm      LV E/e' medial:  19.9 LV IVS:        1.70 cm      LV e' lateral:   6.53 cm/s LVOT diam:     1.80 cm      LV E/e' lateral: 17.6 LV SV:  92 LV SV Index:   48           2D Longitudinal Strain LVOT Area:     2.54 cm     2D Strain GLS Avg:     -17.6 %  LV Volumes (MOD) LV vol d, MOD A4C: 223.0 ml LV vol s, MOD A4C: 75.9 ml LV SV MOD A4C:     223.0 ml RIGHT VENTRICLE             IVC RV Basal diam:  4.55 cm     IVC diam: 2.90 cm RV Mid diam:    3.70 cm RV S prime:     18.00 cm/s TAPSE (M-mode): 2.6 cm LEFT ATRIUM             Index        RIGHT ATRIUM           Index LA diam:        5.60 cm 2.89 cm/m   RA Area:     19.90 cm LA Vol (A2C):   75.8 ml 39.13 ml/m  RA Volume:   60.80 ml  31.38 ml/m LA Vol (A4C):   72.6 ml 37.48 ml/m LA Biplane Vol: 76.0 ml 39.23 ml/m  AORTIC VALVE LVOT Vmax:   167.00 cm/s LVOT Vmean:  113.000 cm/s LVOT VTI:    0.362 m  AORTA Ao Root diam: 2.80 cm Ao Asc diam:  3.20 cm MITRAL VALVE                TRICUSPID VALVE MV Area (PHT): 3.99 cm     TR Peak grad:   28.9 mmHg MV Decel Time: 190 msec     TR Vmax:        269.00 cm/s MR Peak grad: 102.0 mmHg MR Vmax:      505.00 cm/s   SHUNTS MV E velocity: 115.00 cm/s  Systemic VTI:  0.36 m MV A velocity: 121.00 cm/s  Systemic Diam: 1.80 cm MV E/A ratio:  0.95 Vishnu Priya Mallipeddi Electronically signed by Diannah Late Mallipeddi Signature Date/Time: 09/27/2024/11:33:24 AM    Final    DG Chest Portable 1 View Result Date: 09/25/2024 EXAM: 1 VIEW(S) XRAY OF THE CHEST 09/25/2024 09:47:00 PM COMPARISON: 08/17/2024 CLINICAL HISTORY: SOB FINDINGS: LUNGS AND PLEURA: Mild pulmonary edema. No pleural effusion. No pneumothorax. HEART AND MEDIASTINUM:  Cardiomegaly. Aortic atherosclerosis. BONES AND SOFT TISSUES: Remote right rib fractures. IMPRESSION: 1. Mild pulmonary edema. 2. Cardiomegaly and aortic atherosclerosis. 3. Remote right rib fractures. Electronically signed by: Greig Pique MD 09/25/2024 09:57 PM EST RP Workstation: [id number]   I personally spent a total of 50 minutes in the care of the patient today including preparing to see the patient, getting/reviewing separately obtained history, performing a medically appropriate exam/evaluation, counseling and educating, placing orders, documenting clinical information in the EHR, independently interpreting results, communicating results, and coordinating care.   Electronically signed by:   Annalee Orem, MD Infectious Disease Physician [facility] for Infectious Disease Pager: [phone]  "

## 2024-10-05 NOTE — Anesthesia Procedure Notes (Signed)
 Procedure Name: Intubation Date/Time: 10/05/2024 10:24 AM  Performed by: Chaney Ozell CROME, CRNAPre-anesthesia Checklist: Patient identified, Emergency Drugs available, Suction available and Patient being monitored Patient Re-evaluated:Patient Re-evaluated prior to induction Oxygen Delivery Method: Circle System Utilized Preoxygenation: Pre-oxygenation with 100% oxygen Induction Type: IV induction Ventilation: Mask ventilation without difficulty Laryngoscope Size: Mac and 3 Grade View: Grade II Tube type: Oral Tube size: 7.5 mm Number of attempts: 1 Airway Equipment and Method: Stylet and Oral airway Placement Confirmation: ETT inserted through vocal cords under direct vision, positive ETCO2 and breath sounds checked- equal and bilateral Secured at: 22 cm Tube secured with: Tape Dental Injury: Teeth and Oropharynx as per pre-operative assessment

## 2024-10-05 NOTE — Assessment & Plan Note (Signed)
 AM Weight 80.4kg ( 83 kg on admission) - Continue cardiac monitoring  - Cardiology consulted, Appreciated recommendation  - Plan for TEE after iHD placement and Dysphagia evaluation; barium swallow/esophogram prior to TEE   - Hold home ASA 81 mg d/t Anemia required blood transfusion   - Continue home Coreg  12.5 mg BID, Hydralazine  100 mg TID, Amlodipine  10 mg Daily, and Doxazosin  2 mg Daily

## 2024-10-05 NOTE — Plan of Care (Signed)
" ° ° °  Post-op check s/p Tunneled Cath placement w/ Vascular. Pt alert and is able to tolerated PO intake w/o difficulty. OR dsg on new tunneled cath CDI. Likely iHD today per Nephrology. She will be NPO after MN for Barium swallow test with speech tmrw then TEE   Houston Samuels, DO  PGY 1 Family Medicine Resident  "

## 2024-10-05 NOTE — Progress Notes (Signed)
" °{  Important!!  Must Read All  Red and Blue text will automatically delete when note signed   Document Absolute and/or Relative Contraindications    Absolute - Perforated viscus, Esophageal stricture, Esophageal tumor, Esophageal perforation, laceration, Esophageal diverticulum, Active upper GI bleed Relative - History of radiation to neck and mediastinum, History of GI surgery, Recent upper GI bleed, Barretts esophagus, History of dysphagia, Restriction of neck mobility (severe cervical arthritis, atlantoaxial joint disease), Symptomatic hiatal hernia, Esophageal varices, Active esophagitis, Active peptic ulcer disease    Document other considerations   If the pt has a coagulopathy with INR over 4 or Plt count less than 50K, is currently on O2, has a hx of OSA, reduced EF or severe valve stenosis or regurgitation  If pt also needs DCCV   Document if the patient has been appropriately anticoagulated if they are to have a DCCV:1} [facility] has been requested to perform a transesophageal echocardiogram on Catherine Aguilar for bacteremia.    Has noted dysphagia this admission,  Seen by ENT s/p laryngoscopy which showed left vocal cord paralysis seen by Speech pathology on 1/22. Patient reported intermittent dysphagia. Decision was to pursue barium swallow as she does have vocal fold changes and evidence of aspiration PNA  {Does the patient have any absolute or relative contraindications to a TEE?[phone]}  The patient has: No other conditions that may impact this procedure.  {Select ONLY if also doing a cardioversion (Optional):[phone]}  {Agrees, Declines or Contraindication?[phone]}  Signed, Leontine LOISE Salen, PA-C  10/05/2024 6:32 AM   "

## 2024-10-05 NOTE — Progress Notes (Signed)
 [facility] [place] [facility]  Subjective  - frustrated   Objective (!) 151/66 76 98.1 F (36.7 C) (Oral) 18 93%  Intake/Output Summary (Last 24 hours) at 10/05/2024 0958 Last data filed at 10/05/2024 0130 Gross per 24 hour  Intake --  Output 400 ml  Net -400 ml    Left radial and brachial pulse palpable  Laboratory Lab Results: Recent Labs    10/04/24 0315 10/05/24 0122  WBC 12.9* 11.0*  HGB 7.3* 7.8*  HCT 22.1* 23.7*  PLT 228 272   BMET Recent Labs    10/04/24 0315 10/05/24 0516  NA 130* 133*  K 3.8 4.4  CL 89* 90*  CO2 19* 17*  GLUCOSE 89 70  BUN 96* 106*  CREATININE 10.90* 12.00*  CALCIUM  8.7* 9.1    COAG Lab Results  Component Value Date   INR 1.0 09/23/2024   INR 1.1 08/17/2024   INR 0.9 05/17/2023   No results found for: PTT  Assessment/Planning:  69 year old female with end-stage renal disease that presents for permanent dialysis access.  Discussed plan for tunneled dialysis catheter placement and left arm AV fistula versus graft.  She is quite frustrated but amendable to proceed.  All questions answered.  Lonni JINNY Gaskins 10/05/2024 9:58 AM --

## 2024-10-05 NOTE — Progress Notes (Addendum)
 "                                                            [facility] Infectious Diseases Follow Up Note  Patient Identification: Patient Name: Catherine Aguilar MRN: [id number] Admit Date: 09/25/2024  9:21 PM Age: 69 y.o.Today's Date: 10/05/2024  Reason for Visit: Follow-up on MRSA bacteremia  Principal Problem:   MRSA bacteremia Active Problems:   Essential hypertension   Chronic kidney disease (CKD), stage IV (severe) (HCC)   DM2 (diabetes mellitus, type 2) (HCC)   COPD (chronic obstructive pulmonary disease) (HCC)   Anemia in chronic kidney disease (CKD)   Stage 5 chronic kidney disease not on chronic dialysis (HCC)   Cardiorenal syndrome with renal failure   Volume overload   Chronic health problem   Elevated d-dimer   Elevated troponin   Demand ischemia (HCC)   Acute on chronic congestive heart failure (HCC)   Multifocal pneumonia   Shortness of breath   Chronic superficial vein thrombosis involving L small saphenous vein   Bloodstream infection due to central venous catheter   ESRD on hemodialysis (HCC)   Aspiration pneumonia of both lungs (HCC)   Left vocal cord paralysis   Secondary hyperparathyroidism   CAD (coronary artery disease)   Antibiotics:  Vancomycin  with HD 1/19 Augmentin  1/20-  Lines/Hardwares: Right IJ tunneled HDC  Interval Events: Remains afebrile Labs remarkable for WBC at 11, hemoglobin 7.8  Assessment 69 year old female with prior history as below including type II DM, CKD, COPD, WG with renal involvement, CKD initially admitted for CHF exacerbation secondary to progression of CKD to ESRD in the setting of medication nonadherence.  Hospital course complicated with MRSA bacteremia secondary to temporary HD catheter as well as bilateral vocal cord paralysis.  # MRSA bacteremia/CLABSI - Blood cx 1/21 all sets and 1/23 NGTD - 1/18 TTE no vegetations/endocarditis  - HDC removed on 1/21 - On HDC placed today  # Left vocal cord paralysis - Evaluated  by ENT , CT neck with contrast once patient is able to resume dialysis  Recommendations - Continue vancomycin  with HD - s/p 5 days of Augmentin  - Follow-up TEE - Monitor CBC and BMP - Monitor for metastatic sites of infection - Contact precautions  Following   Rest of the management as per the primary team. Thank you for the consult. Please page with pertinent questions or concerns.  ______________________________________________________________________ Subjective patient seen and examined at the bedside.  She is hungry and wants to eat.  Seen after HD catheter placement.  No other concerns.    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   CVA (cerebral vascular accident) (HCC) 07/03/2012   Diffuse pulmonary alveolar hemorrhage    Edema of right lower extremity 03/25/2017   Elevated d-dimer 09/26/2024   Essential hypertension 12/08/2016   Hemoptysis 12/08/2016   High cholesterol    HLD (hyperlipidemia) 12/08/2016  Hx of adenomatous polyp of colon 04/16/2017   Hypertension    stopped taking medications 10 years ago   Hypokalemia 07/02/2012   Intra-alveolar hemorrhage 12/08/2016   Left facial numbness 07/02/2012   Sepsis (HCC) 12/08/2016   Shortness of breath 12/02/2017   Stroke (HCC)    Substance abuse (HCC) 12/08/2016   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   Tobacco abuse 12/08/2016   Type 2 diabetes mellitus without complication, with long-term current use of insulin  (HCC) 02/11/2017   Wegener's granulomatosis with renal involvement (HCC) 12/08/2016   Past Surgical  History:  Procedure Laterality Date   ABDOMINAL HYSTERECTOMY  1985   IR TUNNELED CENTRAL VENOUS CATH PLC W IMG  09/26/2024   Vitals BP 128/65 (BP Location: Right Arm)   Pulse 74   Temp 98.9 F (37.2 C) (Oral)   Resp 15   Ht 5' 7 (1.702 m)   Wt 80.4 kg   SpO2 93%   BMI 27.76 kg/m     Physical Exam Constitutional: Elderly female lying in the bed, nontoxic-appearing    Comments: HEENT WNL  Cardiovascular:     Rate and Rhythm: Normal rate    Heart sounds: S1S2  Pulmonary:     Effort: Pulmonary effort is normal.     Comments: Normal breath sounds  Abdominal:     Palpations: Abdomen is soft.     Tenderness: Nondistended and nontender  Musculoskeletal:        General: No swelling or tenderness in peripheral joints  Skin:    Comments: Right chest HDC with bandage intact  Neurological:     General: Awake, alert and oriented  Psychiatric:        Mood and Affect: Mood normal.   Pertinent Microbiology Results for orders placed or performed during the hospital encounter of 09/25/24  Resp panel by RT-PCR (RSV, Flu A&B, Covid) Anterior Nasal Swab     Status: None   Collection Time: 09/25/24  9:34 PM   Specimen: Anterior Nasal Swab  Result Value Ref Range Status   SARS Coronavirus 2 by RT PCR NEGATIVE NEGATIVE Final   Influenza A by PCR NEGATIVE NEGATIVE Final   Influenza B by PCR NEGATIVE NEGATIVE Final    Comment: (NOTE) The Xpert Xpress SARS-CoV-2/FLU/RSV plus assay is intended as an aid in the diagnosis of influenza from Nasopharyngeal swab specimens and should not be used as a sole basis for treatment. Nasal washings and aspirates are unacceptable for Xpert Xpress SARS-CoV-2/FLU/RSV testing.  Fact Sheet for Patients: bloggercourse.com  Fact Sheet for Healthcare Providers: seriousbroker.it  This test is not yet approved or cleared by the [place]  FDA and has been authorized for detection and/or diagnosis of  SARS-CoV-2 by FDA under an Emergency Use Authorization (EUA). This EUA will remain in effect (meaning this test can be used) for the duration of the COVID-19 declaration under Section 564(b)(1) of the Act, 21 U.S.C. section 360bbb-3(b)(1), unless the authorization is terminated or revoked.     Resp Syncytial Virus by PCR NEGATIVE NEGATIVE Final    Comment: (NOTE) Fact Sheet for Patients: bloggercourse.com  Fact Sheet for Healthcare Providers: seriousbroker.it  This test is not yet approved or cleared by the [place]  FDA and has been authorized for detection and/or diagnosis of SARS-CoV-2 by FDA under an Emergency Use Authorization (EUA). This EUA will remain in effect (meaning this test can be used) for the duration of the COVID-19 declaration under Section 564(b)(1) of the Act, 21 U.S.C. section 360bbb-3(b)(1), unless the authorization  is terminated or revoked.  Performed at [facility], [address] [place], [place] [address]   Respiratory (~20 pathogens) panel by PCR     Status: None   Collection Time: 09/28/24  3:40 PM   Specimen: Nasopharyngeal Swab; Respiratory  Result Value Ref Range Status   Adenovirus NOT DETECTED NOT DETECTED Final   Coronavirus 229E NOT DETECTED NOT DETECTED Final    Comment: (NOTE) The Coronavirus on the Respiratory Panel, DOES NOT test for the novel  Coronavirus (2019 nCoV)    Coronavirus HKU1 NOT DETECTED NOT DETECTED Final   Coronavirus NL63 NOT DETECTED NOT DETECTED Final   Coronavirus OC43 NOT DETECTED NOT DETECTED Final   Metapneumovirus NOT DETECTED NOT DETECTED Final   Rhinovirus / Enterovirus NOT DETECTED NOT DETECTED Final   Influenza A NOT DETECTED NOT DETECTED Final   Influenza B NOT DETECTED NOT DETECTED Final   Parainfluenza Virus 1 NOT DETECTED NOT DETECTED Final   Parainfluenza Virus 2 NOT DETECTED NOT DETECTED Final   Parainfluenza Virus 3 NOT DETECTED NOT  DETECTED Final   Parainfluenza Virus 4 NOT DETECTED NOT DETECTED Final   Respiratory Syncytial Virus NOT DETECTED NOT DETECTED Final   Bordetella pertussis NOT DETECTED NOT DETECTED Final   Bordetella Parapertussis NOT DETECTED NOT DETECTED Final   Chlamydophila pneumoniae NOT DETECTED NOT DETECTED Final   Mycoplasma pneumoniae NOT DETECTED NOT DETECTED Final    Comment: Performed at [facility], [address] [place], [place] [address]  Culture, blood (Routine X 2) w Reflex to ID Panel     Status: Abnormal   Collection Time: 09/28/24  7:00 PM   Specimen: BLOOD  Result Value Ref Range Status   Specimen Description BLOOD SITE NOT SPECIFIED  Final   Special Requests   Final    BOTTLES DRAWN AEROBIC AND ANAEROBIC Blood Culture adequate volume   Culture  Setup Time   Final    GRAM POSITIVE COCCI AEROBIC BOTTLE ONLY CRITICAL RESULT CALLED TO, READ BACK BY AND VERIFIED WITH: PHARMD Damien RAMAN on 012026 @1425  by SM Performed at [facility], [address] [place], [place] [address]    Culture METHICILLIN RESISTANT STAPHYLOCOCCUS AUREUS (A)  Final   Report Status 10/01/2024 FINAL  Final   Organism ID, Bacteria METHICILLIN RESISTANT STAPHYLOCOCCUS AUREUS  Final      Susceptibility   Methicillin resistant staphylococcus aureus - MIC*    CIPROFLOXACIN >=8 RESISTANT Resistant     ERYTHROMYCIN >=8 RESISTANT Resistant     GENTAMICIN <=0.5 SENSITIVE Sensitive     OXACILLIN >=4 RESISTANT Resistant     TETRACYCLINE <=1 SENSITIVE Sensitive     VANCOMYCIN  <=0.5 SENSITIVE Sensitive     TRIMETH /SULFA  >=320 RESISTANT Resistant     CLINDAMYCIN >=8 RESISTANT Resistant     RIFAMPIN <=0.5 SENSITIVE Sensitive     Inducible Clindamycin NEGATIVE Sensitive     LINEZOLID 2 SENSITIVE Sensitive     * METHICILLIN RESISTANT STAPHYLOCOCCUS AUREUS  Blood Culture ID Panel (Reflexed)     Status: Abnormal   Collection Time: 09/28/24  7:00 PM  Result Value Ref Range Status   Enterococcus faecalis NOT  DETECTED NOT DETECTED Final   Enterococcus Faecium NOT DETECTED NOT DETECTED Final   Listeria monocytogenes NOT DETECTED NOT DETECTED Final   Staphylococcus species DETECTED (A) NOT DETECTED Final    Comment: CRITICAL RESULT CALLED TO, READ BACK BY AND VERIFIED WITH: PHARMD Damien RAMAN on 012026 @1425  by SM    Staphylococcus  aureus (BCID) DETECTED (A) NOT DETECTED Final    Comment: Methicillin (oxacillin)-resistant Staphylococcus aureus (MRSA). MRSA is predictably resistant to beta-lactam antibiotics (except ceftaroline). Preferred therapy is vancomycin  unless clinically contraindicated. Patient requires contact precautions if  hospitalized. CRITICAL RESULT CALLED TO, READ BACK BY AND VERIFIED WITH: PHARMD Damien RAMAN on 012026 @1425  by SM    Staphylococcus epidermidis NOT DETECTED NOT DETECTED Final   Staphylococcus lugdunensis NOT DETECTED NOT DETECTED Final   Streptococcus species NOT DETECTED NOT DETECTED Final   Streptococcus agalactiae NOT DETECTED NOT DETECTED Final   Streptococcus pneumoniae NOT DETECTED NOT DETECTED Final   Streptococcus pyogenes NOT DETECTED NOT DETECTED Final   A.calcoaceticus-baumannii NOT DETECTED NOT DETECTED Final   Bacteroides fragilis NOT DETECTED NOT DETECTED Final   Enterobacterales NOT DETECTED NOT DETECTED Final   Enterobacter cloacae complex NOT DETECTED NOT DETECTED Final   Escherichia coli NOT DETECTED NOT DETECTED Final   Klebsiella aerogenes NOT DETECTED NOT DETECTED Final   Klebsiella oxytoca NOT DETECTED NOT DETECTED Final   Klebsiella pneumoniae NOT DETECTED NOT DETECTED Final   Proteus species NOT DETECTED NOT DETECTED Final   Salmonella species NOT DETECTED NOT DETECTED Final   Serratia marcescens NOT DETECTED NOT DETECTED Final   Haemophilus influenzae NOT DETECTED NOT DETECTED Final   Neisseria meningitidis NOT DETECTED NOT DETECTED Final   Pseudomonas aeruginosa NOT DETECTED NOT DETECTED Final   Stenotrophomonas maltophilia NOT DETECTED NOT  DETECTED Final   Candida albicans NOT DETECTED NOT DETECTED Final   Candida auris NOT DETECTED NOT DETECTED Final   Candida glabrata NOT DETECTED NOT DETECTED Final   Candida krusei NOT DETECTED NOT DETECTED Final   Candida parapsilosis NOT DETECTED NOT DETECTED Final   Candida tropicalis NOT DETECTED NOT DETECTED Final   Cryptococcus neoformans/gattii NOT DETECTED NOT DETECTED Final   Meth resistant mecA/C and MREJ DETECTED (A) NOT DETECTED Final    Comment: CRITICAL RESULT CALLED TO, READ BACK BY AND VERIFIED WITH: PHARMD Damien RAMAN on 012026 @1425  by SM Performed at [facility], [address] [place], [place] [address]   MIC (1 Drug)-     Status: Abnormal   Collection Time: 09/28/24  7:00 PM  Result Value Ref Range Status   Min Inhibitory Conc (1 Drug) Preliminary report (A)  Final    Comment: (NOTE) Performed At: [facility] [address] [place], [place] [id number] Jennette Shorter MD Ey:[id number]    Source LAB [phone] MRSA BLOOD DAPTOMYCIN  Final    Comment: Performed at [facility], [address] [place], [place] [address]  MIC Result     Status: Abnormal   Collection Time: 09/28/24  7:00 PM  Result Value Ref Range Status   Result 1 (MIC) Comment (A)  Final    Comment: (NOTE) Methicillin - resistant Staphylococcus aureus Identification performed by account, not confirmed by this laboratory. DAPTOMYCIN Performed At: [facility] [address] [place], [place] [id number] Jennette Shorter MD Ey:[id number]   Culture, blood (Routine X 2) w Reflex to ID Panel     Status: Abnormal   Collection Time: 09/28/24  7:02 PM   Specimen: BLOOD  Result Value Ref Range Status   Specimen Description BLOOD SITE NOT SPECIFIED  Final   Special Requests   Final    BOTTLES DRAWN AEROBIC AND ANAEROBIC Blood Culture adequate volume   Culture  Setup Time   Final    GRAM POSITIVE COCCI IN CLUSTERS ANAEROBIC BOTTLE ONLY CRITICAL VALUE NOTED.  VALUE IS  CONSISTENT  WITH PREVIOUSLY REPORTED AND CALLED VALUE.    Culture (A)  Final    STAPHYLOCOCCUS AUREUS SUSCEPTIBILITIES PERFORMED ON PREVIOUS CULTURE WITHIN THE LAST 5 DAYS. Performed at [facility], [address] [place], [place] [address]    Report Status 10/01/2024 FINAL  Final  MRSA Next Gen by PCR, Nasal     Status: Abnormal   Collection Time: 09/29/24  7:13 AM   Specimen: Nasal Mucosa; Nasal Swab  Result Value Ref Range Status   MRSA by PCR Next Gen DETECTED (A) NOT DETECTED Final    Comment: RESULT CALLED TO, READ BACK BY AND VERIFIED WITH: RN CHRISTELLA HALEY [id number] AT 1136 BY CM (NOTE) The GeneXpert MRSA Assay (FDA approved for NASAL specimens only), is one component of a comprehensive MRSA colonization surveillance program. It is not intended to diagnose MRSA infection nor to guide or monitor treatment for MRSA infections. Test performance is not FDA approved in patients less than 76 years old. Performed at [facility], [address], [place], [place] [address]   Culture, blood (Routine X 2) w Reflex to ID Panel     Status: None (Preliminary result)   Collection Time: 09/30/24  7:16 AM   Specimen: BLOOD RIGHT ARM  Result Value Ref Range Status   Specimen Description BLOOD RIGHT ARM  Final   Special Requests   Final    BOTTLES DRAWN AEROBIC AND ANAEROBIC Blood Culture adequate volume   Culture   Final    NO GROWTH 4 DAYS Performed at [facility], [address], [place], [place] [address]    Report Status PENDING  Incomplete  Culture, blood (Routine X 2) w Reflex to ID Panel     Status: None (Preliminary result)   Collection Time: 09/30/24  7:25 AM   Specimen: BLOOD RIGHT ARM  Result Value Ref Range Status   Specimen Description BLOOD RIGHT ARM  Final   Special Requests   Final    BOTTLES DRAWN AEROBIC AND ANAEROBIC Blood Culture adequate volume   Culture   Final    NO GROWTH 4 DAYS Performed at [facility], [address] [place], [place] [address]     Report Status PENDING  Incomplete  Culture, blood (Routine X 2) w Reflex to ID Panel     Status: None (Preliminary result)   Collection Time: 09/30/24 12:12 PM   Specimen: BLOOD RIGHT HAND  Result Value Ref Range Status   Specimen Description BLOOD RIGHT HAND  Final   Special Requests   Final    BOTTLES DRAWN AEROBIC AND ANAEROBIC Blood Culture adequate volume   Culture   Final    NO GROWTH 4 DAYS Performed at [facility], [address] [place], [place] [address]    Report Status PENDING  Incomplete  Culture, blood (Routine X 2) w Reflex to ID Panel     Status: None (Preliminary result)   Collection Time: 09/30/24 12:14 PM   Specimen: BLOOD LEFT HAND  Result Value Ref Range Status   Specimen Description BLOOD LEFT HAND  Final   Special Requests   Final    BOTTLES DRAWN AEROBIC AND ANAEROBIC Blood Culture adequate volume   Culture   Final    NO GROWTH 4 DAYS Performed at [facility], [address], [place], [place] [address]    Report Status PENDING  Incomplete  Culture, blood (Routine X 2) w Reflex to ID Panel  Status: None (Preliminary result)   Collection Time: 10/02/24  9:48 PM   Specimen: BLOOD  Result Value Ref Range Status   Specimen Description BLOOD LEFT ANTECUBITAL  Final   Special Requests   Final    BOTTLES DRAWN AEROBIC AND ANAEROBIC Blood Culture adequate volume   Culture   Final    NO GROWTH 2 DAYS Performed at [facility], [address], [place], [place] [address]    Report Status PENDING  Incomplete  Culture, blood (Routine X 2) w Reflex to ID Panel     Status: None (Preliminary result)   Collection Time: 10/02/24  9:59 PM   Specimen: BLOOD LEFT HAND  Result Value Ref Range Status   Specimen Description BLOOD LEFT HAND  Final   Special Requests   Final    BOTTLES DRAWN AEROBIC AND ANAEROBIC Blood Culture adequate volume   Culture   Final    NO GROWTH 2 DAYS Performed at [facility], [address] [place],  [place] [address]    Report Status PENDING  Incomplete   Pertinent Lab.    Latest Ref Rng & Units 10/05/2024    1:22 AM 10/04/2024    3:15 AM 10/03/2024    3:15 AM  CBC  WBC 4.0 - 10.5 K/uL 11.0  12.9  18.2   Hemoglobin 12.0 - 15.0 g/dL 7.8  7.3  7.4   Hematocrit 36.0 - 46.0 % 23.7  22.1  22.5   Platelets 150 - 400 K/uL 272  228  198       Latest Ref Rng & Units 10/05/2024    5:16 AM 10/04/2024    3:15 AM 10/03/2024    3:15 AM  CMP  Glucose 70 - 99 mg/dL 70  89  95   BUN 8 - 23 mg/dL 893  96  91   Creatinine 0.44 - 1.00 mg/dL 87.99  89.09  89.79   Sodium 135 - 145 mmol/L 133  130  131   Potassium 3.5 - 5.1 mmol/L 4.4  3.8  3.6   Chloride 98 - 111 mmol/L 90  89  90   CO2 22 - 32 mmol/L 17  19  17    Calcium  8.9 - 10.3 mg/dL 9.1  8.7  8.5      Pertinent Imaging today Plain films and CT images have been personally visualized and interpreted; radiology reports have been reviewed. Decision making incorporated into the Impression /   VAS US  LOWER EXTREMITY VENOUS (DVT) Result Date: 09/29/2024  Lower Venous DVT Study Patient Name:  TAKYLA KUCHERA  Date of Exam:   09/29/2024 Medical Rec #: [id number]        Accession #:    [id number] Date of Birth: 1956/03/18        Patient Gender: F Patient Age:   64 years Exam Location:  [facility] Procedure:      VAS US  LOWER EXTREMITY VENOUS (DVT) Referring Phys: LAMAR PATERSON --------------------------------------------------------------------------------  Indications: Edema, and SOB. Other Indications: Prior CVA. Limitations: Limited exam due to patient's uncooperativeness. Comparison Study: Previous study of the right lower extremity on 7.18.2018. Performing Technologist: Edilia Elden Appl  Examination Guidelines: A complete evaluation includes B-mode imaging, spectral Doppler, color Doppler, and power Doppler as needed of all accessible portions of each vessel. Bilateral testing is considered an integral part of a complete examination. Limited  examinations for reoccurring indications may be performed as noted. The reflux portion of the exam is performed with the patient  in reverse Trendelenburg.  +---------+---------------+---------+-----------+----------+--------------+ RIGHT    CompressibilityPhasicitySpontaneityPropertiesThrombus Aging +---------+---------------+---------+-----------+----------+--------------+ CFV      Full           No       Yes                                 +---------+---------------+---------+-----------+----------+--------------+ SFJ      Full           No       Yes                                 +---------+---------------+---------+-----------+----------+--------------+ FV Prox  Full                                                        +---------+---------------+---------+-----------+----------+--------------+ FV Mid   Full                                                        +---------+---------------+---------+-----------+----------+--------------+ FV DistalFull                                                        +---------+---------------+---------+-----------+----------+--------------+ PFV      Full                                                        +---------+---------------+---------+-----------+----------+--------------+ POP      Full           No       Yes                                 +---------+---------------+---------+-----------+----------+--------------+ PTV      Full                                                        +---------+---------------+---------+-----------+----------+--------------+ PERO     Full                                                        +---------+---------------+---------+-----------+----------+--------------+   +---------+---------------+---------+-----------+----------+--------------+ LEFT     CompressibilityPhasicitySpontaneityPropertiesThrombus Aging  +---------+---------------+---------+-----------+----------+--------------+ CFV      Full           No       Yes                                 +---------+---------------+---------+-----------+----------+--------------+  SFJ      Full           No       Yes                                 +---------+---------------+---------+-----------+----------+--------------+ FV Prox  Full                                                        +---------+---------------+---------+-----------+----------+--------------+ FV Mid   Full                                                        +---------+---------------+---------+-----------+----------+--------------+ FV DistalFull                                                        +---------+---------------+---------+-----------+----------+--------------+ PFV      Full                                                        +---------+---------------+---------+-----------+----------+--------------+ POP      Full           No       Yes                                 +---------+---------------+---------+-----------+----------+--------------+ PTV      Full                                                        +---------+---------------+---------+-----------+----------+--------------+ PERO     Full                                                        +---------+---------------+---------+-----------+----------+--------------+ SSV      Full           No       Yes                  Chronic        +---------+---------------+---------+-----------+----------+--------------+ Chronic calcified thrombus noted in the left small saphenous vein.    Summary: RIGHT: - There is no evidence of deep vein thrombosis in the lower extremity.  - No cystic structure found in the popliteal fossa.  LEFT: - Findings consistent with chronic superficial vein thrombosis involving the left small saphenous vein.  - No cystic structure found in  the popliteal fossa.  *  See table(s) above for measurements and observations. Electronically signed by Gaile New MD on 09/29/2024 at 12:45:02 PM.    Final    VAS US  UPPER EXT VEIN MAPPING (PRE-OP  AVF) Result Date: 09/29/2024 UPPER EXTREMITY VEIN MAPPING Patient Name:  LAKRISTA SCADUTO  Date of Exam:   09/29/2024 Medical Rec #: [id number]        Accession #:    [id number] Date of Birth: 1956-01-08        Patient Gender: F Patient Age:   78 years Exam Location:  [facility] Procedure:      VAS US  UPPER EXT VEIN MAPPING (PRE-OP  AVF) Referring Phys: GAILE NEW --------------------------------------------------------------------------------  Indications: Pre-access. History: ESRD, volume overload.  Limitations: Limited exam due to patient's uncooperativeness, refused to              continue with exam. Comparison Study: No prior exam. Performing Technologist: Edilia Elden Appl  Examination Guidelines: A complete evaluation includes B-mode imaging, spectral Doppler, color Doppler, and power Doppler as needed of all accessible portions of each vessel. Bilateral testing is considered an integral part of a complete examination. Limited examinations for reoccurring indications may be performed as noted. +-----------------+-------------+----------+--------------+ Right Cephalic   Diameter (cm)Depth (cm)   Findings    +-----------------+-------------+----------+--------------+ Shoulder             0.17        0.80                  +-----------------+-------------+----------+--------------+ Prox upper arm                          not visualized +-----------------+-------------+----------+--------------+ Mid upper arm                           not visualized +-----------------+-------------+----------+--------------+ Dist upper arm                          not visualized +-----------------+-------------+----------+--------------+ Antecubital fossa    0.45        0.55      Thrombus     +-----------------+-------------+----------+--------------+ Prox forearm         0.49        0.43      Thrombus    +-----------------+-------------+----------+--------------+ Mid forearm          0.51        0.36                  +-----------------+-------------+----------+--------------+ Dist forearm         0.19        0.11                  +-----------------+-------------+----------+--------------+ Wrist                0.15        0.13                  +-----------------+-------------+----------+--------------+ +-----------------+-------------+----------+---------+ Right Basilic    Diameter (cm)Depth (cm)Findings  +-----------------+-------------+----------+---------+ Mid upper arm        0.53               branching +-----------------+-------------+----------+---------+ Dist upper arm       0.47               branching +-----------------+-------------+----------+---------+ Antecubital fossa    0.35                         +-----------------+-------------+----------+---------+  Prox forearm         0.17                         +-----------------+-------------+----------+---------+ Mid forearm          0.17                         +-----------------+-------------+----------+---------+ Distal forearm       0.19                         +-----------------+-------------+----------+---------+ Elbow                0.29                         +-----------------+-------------+----------+---------+ Wrist                0.16                         +-----------------+-------------+----------+---------+ +-----------------+-------------+----------+--------------+ Left Cephalic    Diameter (cm)Depth (cm)   Findings    +-----------------+-------------+----------+--------------+ Shoulder             0.21        2.00                  +-----------------+-------------+----------+--------------+ Prox upper arm       0.12        0.73                   +-----------------+-------------+----------+--------------+ Mid upper arm                           not visualized +-----------------+-------------+----------+--------------+ Dist upper arm                          not visualized +-----------------+-------------+----------+--------------+ Antecubital fossa    0.69        0.35                  +-----------------+-------------+----------+--------------+ Prox forearm         0.48        0.16                  +-----------------+-------------+----------+--------------+ Mid forearm          0.31        0.20                  +-----------------+-------------+----------+--------------+ Dist forearm         0.27        0.14                  +-----------------+-------------+----------+--------------+ Wrist                0.16        0.16                  +-----------------+-------------+----------+--------------+ +--------------+-------------+----------+--------+ Left Basilic  Diameter (cm)Depth (cm)Findings +--------------+-------------+----------+--------+ Mid upper arm     0.80                        +--------------+-------------+----------+--------+ Dist upper arm    0.46                        +--------------+-------------+----------+--------+  Patient refused to continue the exam, left upper extremity is limited. *See table(s) above for measurements and observations.  Diagnosing physician: Gaile New MD Electronically signed by Gaile New MD on 09/29/2024 at 12:44:30 PM.    Final    CT Angio Chest Pulmonary Embolism (PE) W or WO Contrast Result Date: 09/28/2024 EXAM: CTA CHEST 09/28/2024 06:48:36 PM TECHNIQUE: CTA of the chest was performed after the administration of intravenous contrast. Multiplanar reformatted images are provided for review. MIP images are provided for review. Automated exposure control, iterative reconstruction, and/or weight based adjustment of the mA/kV was utilized to reduce the radiation  dose to as low as reasonably achievable. COMPARISON: Chest CT 07/09/2024. CLINICAL HISTORY: Pulmonary embolism (PE) suspected, low to intermediate prob, positive D-dimer. FINDINGS: PULMONARY ARTERIES: Pulmonary arteries are adequately opacified for evaluation. No acute pulmonary embolus. Main pulmonary artery is normal in caliber. MEDIASTINUM: The heart is enlarged. Coronary atherosclerotic calcifications are noted. There is a stable small pericardial effusion. Aortic atherosclerotic calcifications are noted. There is a 6 mm hypodense isthmus nodule. No aortic dissection, intramural hematoma, aortic ulceration, or rupture. LYMPH NODES: No mediastinal, hilar or axillary lymphadenopathy. LUNGS AND PLEURA: Mild emphysema present. Airspace consolidation in the left lower lobe inferiorly and also minimally within the lingula. Additional tree in bud opacities in the inferior left upper lobe. Secretions in the left lower lobe bronchus. No evidence of pleural effusion or pneumothorax. UPPER ABDOMEN: Cysts in the kidneys. SOFT TISSUES AND BONES: Healed right sided 7th and 8th rib fractures. No acute soft tissue abnormality. IMPRESSION: 1. No evidence of pulmonary embolism. 2. Airspace consolidation in the left lower lobe and inferior left upper lobe compatible with multifocal pneumonia. There are secretions in the left lower lobe bronchus concerning for aspiration. 3. Mild emphysema; pulmonary emphysema is an independent risk factor for lung cancer, recommend consideration for evaluation for a low-dose CT lung cancer screening program. Electronically signed by: Greig Pique MD 09/28/2024 07:03 PM EST RP Workstation: [id number]   DG Chest Port 1 View Result Date: 09/28/2024 CLINICAL DATA:  Acute respiratory distress. EXAM: PORTABLE CHEST 1 VIEW COMPARISON:  September 25, 2024 FINDINGS: Since the prior study there is been interval placement of a right-sided venous catheter with its distal tip noted at the junction of the  superior vena cava and right atrium. The cardiac silhouette is enlarged and unchanged in size. There is prominence of the central pulmonary vasculature. Mild diffusely increased interstitial lung markings are also noted. Mild to moderate severity atelectasis and/or infiltrate is suspected within the retrocardiac region of the left lung base. A small left pleural effusion is also noted. No pneumothorax is identified. Chronic right rib fractures are present. IMPRESSION: 1. Cardiomegaly with mild pulmonary vascular congestion and mild interstitial edema. 2. Mild to moderate severity left basilar atelectasis and/or infiltrate. 3. Small left pleural effusion. Electronically Signed   By: Suzen Dials M.D.   On: 09/28/2024 14:19   IR NON-TUNNELED CENTRAL VENOUS CATH [facility] W IMG Result Date: 09/27/2024 INDICATION: 69 year old female with history of renal failure requiring central venous access for initiation of hemodialysis. EXAM: NON-TUNNELED CENTRAL VENOUS HEMODIALYSIS CATHETER PLACEMENT WITH ULTRASOUND AND FLUOROSCOPIC GUIDANCE COMPARISON:  None Available. MEDICATIONS: None FLUOROSCOPY TIME:  Five mGy reference air kerma COMPLICATIONS: None immediate. PROCEDURE: Informed written consent was obtained from the patient after a discussion of the risks, benefits, and alternatives to treatment. Questions regarding the procedure were encouraged and answered. The right neck and chest were prepped with chlorhexidine  in a sterile fashion, and  a sterile drape was applied covering the operative field. Maximum barrier sterile technique with sterile gowns and gloves were used for the procedure. A timeout was performed prior to the initiation of the procedure. After the overlying soft tissues were anesthetized, a small venotomy incision was created and a micropuncture kit was utilized to access the internal jugular vein. Real-time ultrasound guidance was utilized for vascular access including the acquisition of a permanent  ultrasound image documenting patency of the accessed vessel. The microwire was utilized to measure appropriate catheter length. A guidewire was advanced to the level of the IVC. Under fluoroscopic guidance, the venotomy was serially dilated, ultimately allowing placement of a 16 cm temporary Trialysis catheter with tip ultimately terminating within the superior aspect of the right atrium. Final catheter positioning was confirmed and documented with a spot radiographic image. The catheter aspirates and flushes normally. The catheter was flushed with appropriate volume heparin  dwells. The catheter exit site was secured with a 0 silk retention suture. A dressing was placed. The patient tolerated the procedure well without immediate post procedural complication. IMPRESSION: Successful placement of a right internal jugular approach 16 cm temporary dialysis catheter with tip terminating with in the superior aspect of the right atrium. The catheter is ready for immediate use. PLAN: This catheter may be converted to a tunneled dialysis catheter at a later date as indicated. Ester Sides, MD [facility] Radiology Electronically Signed   By: Ester Sides M.D.   On: 09/27/2024 14:01   ECHOCARDIOGRAM COMPLETE Result Date: 09/27/2024    ECHOCARDIOGRAM REPORT   Patient Name:   Janda Cargo Date of Exam: 09/27/2024 Medical Rec #:  [id number]       Height:       67.0 in Accession #:    [id number]      Weight:       180.8 lb Date of Birth:  1955/10/04       BSA:          1.937 m Patient Age:    68 years        BP:           146/66 mmHg Patient Gender: F               HR:           65 bpm. Exam Location:  Inpatient Procedure: 2D Echo, Cardiac Doppler, Color Doppler and Strain Analysis (Both            Spectral and Color Flow Doppler were utilized during procedure). Indications:    CHF- Acute Systolic I50.21  History:        Patient has prior history of Echocardiogram examinations,  most                 recent 12/11/2023. CHF, COPD and Stroke; Risk Factors:Diabetes,                 CKD, Dyslipidemia and Hypertension.  Sonographer:    Koleen Popper RDCS Referring Phys: TWYLA NEARING  Sonographer Comments: Image acquisition challenging due to respiratory motion. IMPRESSIONS  1. Left ventricular ejection fraction, by estimation, is 70 to 75%. The left ventricle has hyperdynamic function. The left ventricle has no regional wall motion abnormalities. There is severe asymmetric left ventricular hypertrophy of the septal segment. Turbulent flow/flow accerelation in the LVOT likjely from a combination of severe LVH and hyperdynamic LV function. Left ventricular diastolic parameters are consistent with Grade I diastolic dysfunction (impaired relaxation). Elevated  left ventricular end-diastolic pressure. The average left ventricular global longitudinal strain is -17.6 %. The global longitudinal strain is normal.  2. Right ventricular systolic function is normal. The right ventricular size is moderately enlarged. There is mildly elevated pulmonary artery systolic pressure. The estimated right ventricular systolic pressure is 36.9 mmHg.  3. Left atrial size was mildly dilated.  4. Right atrial size was mild to moderately dilated.  5. The mitral valve is grossly normal. No evidence of mitral valve regurgitation. No evidence of mitral stenosis.  6. The aortic valve was not well visualized. Aortic valve regurgitation is not visualized. No aortic stenosis is present.  7. The inferior vena cava is dilated in size with >50% respiratory variability, suggesting right atrial pressure of 8 mmHg.  8. Increased flow velocities may be secondary to anemia, thyrotoxicosis, hyperdynamic or high flow state.  9. Small to moderate pericardial effusion is located posterior and lateral to the left ventricle. FINDINGS  Left Ventricle: Left ventricular ejection fraction, by estimation, is 70 to 75%. The left ventricle has  hyperdynamic function. The left ventricle has no regional wall motion abnormalities. The average left ventricular global longitudinal strain is -17.6  %. Strain was performed and the global longitudinal strain is normal. The left ventricular internal cavity size was normal in size. There is severe asymmetric left ventricular hypertrophy of the septal segment. Left ventricular diastolic parameters are consistent with Grade I diastolic dysfunction (impaired relaxation). Elevated left ventricular end-diastolic pressure. Right Ventricle: The right ventricular size is moderately enlarged. No increase in right ventricular wall thickness. Right ventricular systolic function is normal. There is mildly elevated pulmonary artery systolic pressure. The tricuspid regurgitant velocity is 2.69 m/s, and with an assumed right atrial pressure of 8 mmHg, the estimated right ventricular systolic pressure is 36.9 mmHg. Left Atrium: Left atrial size was mildly dilated. Right Atrium: Right atrial size was mild to moderately dilated. Pericardium: Small to moderate. The pericardial effusion is posterior and lateral to the left ventricle. Mitral Valve: The mitral valve is grossly normal. Mild to moderate mitral annular calcification. No evidence of mitral valve regurgitation. No evidence of mitral valve stenosis. Tricuspid Valve: The tricuspid valve is grossly normal. Tricuspid valve regurgitation is trivial. No evidence of tricuspid stenosis. Aortic Valve: The aortic valve was not well visualized. Aortic valve regurgitation is not visualized. No aortic stenosis is present. Pulmonic Valve: The pulmonic valve was not well visualized. Pulmonic valve regurgitation is not visualized. No evidence of pulmonic stenosis. Aorta: The aortic root is normal in size and structure. Venous: The inferior vena cava is dilated in size with greater than 50% respiratory variability, suggesting right atrial pressure of 8 mmHg. IAS/Shunts: No atrial level shunt  detected by color flow Doppler. Additional Comments: 3D was performed not requiring image post processing on an independent workstation and was indeterminate.  LEFT VENTRICLE PLAX 2D LVIDd:         5.10 cm      Diastology LVIDs:         2.90 cm      LV e' medial:    5.77 cm/s LV PW:         1.40 cm      LV E/e' medial:  19.9 LV IVS:        1.70 cm      LV e' lateral:   6.53 cm/s LVOT diam:     1.80 cm      LV E/e' lateral: 17.6 LV SV:  92 LV SV Index:   48           2D Longitudinal Strain LVOT Area:     2.54 cm     2D Strain GLS Avg:     -17.6 %  LV Volumes (MOD) LV vol d, MOD A4C: 223.0 ml LV vol s, MOD A4C: 75.9 ml LV SV MOD A4C:     223.0 ml RIGHT VENTRICLE             IVC RV Basal diam:  4.55 cm     IVC diam: 2.90 cm RV Mid diam:    3.70 cm RV S prime:     18.00 cm/s TAPSE (M-mode): 2.6 cm LEFT ATRIUM             Index        RIGHT ATRIUM           Index LA diam:        5.60 cm 2.89 cm/m   RA Area:     19.90 cm LA Vol (A2C):   75.8 ml 39.13 ml/m  RA Volume:   60.80 ml  31.38 ml/m LA Vol (A4C):   72.6 ml 37.48 ml/m LA Biplane Vol: 76.0 ml 39.23 ml/m  AORTIC VALVE LVOT Vmax:   167.00 cm/s LVOT Vmean:  113.000 cm/s LVOT VTI:    0.362 m  AORTA Ao Root diam: 2.80 cm Ao Asc diam:  3.20 cm MITRAL VALVE                TRICUSPID VALVE MV Area (PHT): 3.99 cm     TR Peak grad:   28.9 mmHg MV Decel Time: 190 msec     TR Vmax:        269.00 cm/s MR Peak grad: 102.0 mmHg MR Vmax:      505.00 cm/s   SHUNTS MV E velocity: 115.00 cm/s  Systemic VTI:  0.36 m MV A velocity: 121.00 cm/s  Systemic Diam: 1.80 cm MV E/A ratio:  0.95 Vishnu Priya Mallipeddi Electronically signed by Diannah Late Mallipeddi Signature Date/Time: 09/27/2024/11:33:24 AM    Final    DG Chest Portable 1 View Result Date: 09/25/2024 EXAM: 1 VIEW(S) XRAY OF THE CHEST 09/25/2024 09:47:00 PM COMPARISON: 08/17/2024 CLINICAL HISTORY: SOB FINDINGS: LUNGS AND PLEURA: Mild pulmonary edema. No pleural effusion. No pneumothorax. HEART AND MEDIASTINUM:  Cardiomegaly. Aortic atherosclerosis. BONES AND SOFT TISSUES: Remote right rib fractures. IMPRESSION: 1. Mild pulmonary edema. 2. Cardiomegaly and aortic atherosclerosis. 3. Remote right rib fractures. Electronically signed by: Greig Pique MD 09/25/2024 09:57 PM EST RP Workstation: [id number]   I personally spent a total of 50 minutes in the care of the patient today including preparing to see the patient, getting/reviewing separately obtained history, performing a medically appropriate exam/evaluation, counseling and educating, placing orders, documenting clinical information in the EHR, independently interpreting results, communicating results, and coordinating care.   Electronically signed by:   Annalee Orem, MD Infectious Disease Physician [facility] for Infectious Disease Pager: [phone]  "

## 2024-10-05 NOTE — Anesthesia Procedure Notes (Signed)
 Procedure Name: LMA Insertion Date/Time: 10/05/2024 10:16 AM  Performed by: Marialuiza Car L, CRNAPre-anesthesia Checklist: Patient identified, Emergency Drugs available, Suction available and Patient being monitored Patient Re-evaluated:Patient Re-evaluated prior to induction Oxygen Delivery Method: Circle System Utilized Preoxygenation: Pre-oxygenation with 100% oxygen Induction Type: IV induction Ventilation: Mask ventilation without difficulty LMA: LMA inserted LMA Size: 4.0 Number of attempts: 1 Placement Confirmation: positive ETCO2 Tube secured with: Tape Dental Injury: Teeth and Oropharynx as per pre-operative assessment

## 2024-10-05 NOTE — Transfer of Care (Signed)
 Immediate Anesthesia Transfer of Care Note  Patient: Catherine Aguilar  Procedure(s) Performed: INSERTION OF LEFT ARM RADIOCEPHALIC  ARTERIOVENOUS FISTULA (Left: Arm Upper) INSERTION OF RIGHT INTERNAL JUGULAR DIALYSIS CATHETER (Right: Neck)  Patient Location: PACU  Anesthesia Type:General  Level of Consciousness: awake, sedated, and patient cooperative  Airway & Oxygen Therapy: Patient Spontanous Breathing and Patient connected to face mask oxygen  Post-op Assessment: Report given to RN, Post -op Vital signs reviewed and stable, and Patient moving all extremities  Post vital signs: Reviewed and stable  Last Vitals:  Vitals Value Taken Time  BP 126/59 10/05/24 11:53  Temp    Pulse 75 10/05/24 11:56  Resp 18 10/05/24 11:56  SpO2 92 % 10/05/24 11:56  Vitals shown include unfiled device data.  Last Pain:  Vitals:   10/05/24 0754  TempSrc:   PainSc: 0-No pain      Patients Stated Pain Goal: 4 (09/28/24 0325)  Complications: There were no known notable events for this encounter.

## 2024-10-05 NOTE — Assessment & Plan Note (Signed)
 DMII: BCG ACHS, Continue SSI  HLD: continue home Pravastatin  40 mg daily Chronic SVT of L small saphenous vein - Not required AC per vascular

## 2024-10-05 NOTE — Assessment & Plan Note (Signed)
 Blood Cx neg to date. AM WBC 11 from 12.9 yesterday - Continue contact isolation (+ MRSA)  - ID consulted, appreciate recommendations - Continue vancomycin  (1/19 - ) - Completed PO Augmentin  250-125 mg q12hrs (1/20-1/24) for PNA - Recommend TEE given persistent leukocytosis, planned for 01/26 after dysphagia screening by SLP per cardiology - Daily CBC

## 2024-10-05 NOTE — Progress Notes (Signed)
 SLP Cancellation Note  Patient Details Name: Catherine Aguilar MRN: [id number] DOB: 08/03/1956   Cancelled treatment:       Reason Eval/Treat Not Completed: Medical issues which prohibited therapy. SLP following to complete MBS. Note that procedure from last week was postponed and pt is NPO for reattempt possibly today. SLP will continue to follow as pt is able.    Leita SAILOR., M.A. CCC-SLP Acute Rehabilitation Services Office: [phone]  Secure chat preferred  10/05/2024, 8:04 AM

## 2024-10-05 NOTE — Assessment & Plan Note (Signed)
-   ENT consulted, appreciate recommendations - Recommending CT neck with contrast once patient is able to resume dialysis - Continue hydroxyzine  10mg  TID PRN for Anxiety 2/2 respiratory status - Pain : Tylenol  650 mg q6hrs PRN , Oxycodone  5 mg Daily PRN

## 2024-10-05 NOTE — Progress Notes (Signed)
 "    Daily Progress Note Intern Pager: [phone]  Patient name: Catherine Aguilar Medical record number: [id number] Date of birth: December 20, 1955 Age: 69 y.o. Gender: female  Primary Care Provider: Adele Song, MD Consultants: Nephrology, Cardiology, Vasc surg, ENT, ID Code Status: Full Code  Pt Overview and Major Events to Date:  01/17 : Admitted, Vasc cath placed  01/18 : Starting iHD.  01/19 - 1/20 : RRT x 3 d/t WOB c/f Vocal cord paralysis 1/21 : Fiberoptic Laryngoscopy confirmed Left Vocal cord paralysis. Vasc Cath removed d/t + MRSA on Bcx 1/26 : Tunneled cath w/ Vas Surg   Catherine Aguilar is a 69 YO female w/ PMH of DMII, HTN, ERSD, Anemia of Chronic disease, COPD, and Wegener's granulomatosis with renal involvement. She has been admitted for CHF exacerbation. Her hospitalization has been c/b worsening kidney fx required iHD, MRSA bacteremia, and left vocal cord paralysis.  Assessment & Plan MRSA bacteremia Aspiration pneumonia of both lungs (HCC) Blood Cx neg to date. AM WBC 11 from 12.9 yesterday - Continue contact isolation (+ MRSA)  - ID consulted, appreciate recommendations - Continue vancomycin  (1/19 - ) - Completed PO Augmentin  250-125 mg q12hrs (1/20-1/24) for PNA - Recommend TEE given persistent leukocytosis, planned for 01/26 after dysphagia screening by SLP per cardiology - Daily CBC ESRD on hemodialysis (HCC) Anemia in chronic kidney disease (CKD) Secondary hyperparathyroidism HgB 7.8 this morning, Last dose ESA on 01/25 - Nephrology consulted, appreciate recommendations. - Plan to reinitiate dialysis after tunneled catheter placement by VVS planned for 01/26 - Continue phosphate binder calcium  acetate 667mg  TID with meals - Transfusion threshold <8    - Will plan to transfuse 1 u pRBC during iHD - Vascular surgery Consulted and follow  - OR today for Tunneled catheter placement for iHD - Continue Tylenol  650 mg q6hrs PRN PO or rectal for pain - Avoid nephrotoxic  agents - Strict I&Os, daily weights - Daily RFP COPD (chronic obstructive pulmonary disease) (HCC) On RA, denies SOB - Continue home Breztri  inhaler 2 puffs daily  - Continue DuoNebs BID scheduled and q2h PRN - Benzonatate  200 mg TID PRN for cough Left vocal cord paralysis - ENT consulted, appreciate recommendations - Recommending CT neck with contrast once patient is able to resume dialysis - Continue hydroxyzine  10mg  TID PRN for Anxiety 2/2 respiratory status - Pain : Tylenol  650 mg q6hrs PRN , Oxycodone  5 mg Daily PRN  Acute on chronic congestive heart failure (HCC) Essential hypertension CAD (coronary artery disease) AM Weight 80.4kg ( 83 kg on admission) - Continue cardiac monitoring  - Cardiology consulted, Appreciated recommendation  - Plan for TEE after iHD placement and Dysphagia evaluation; barium swallow/esophogram prior to TEE   - Hold home ASA 81 mg d/t Anemia required blood transfusion   - Continue home Coreg  12.5 mg BID, Hydralazine  100 mg TID, Amlodipine  10 mg Daily, and Doxazosin  2 mg Daily Chronic health problem DMII: BCG ACHS, Continue SSI HLD: continue home Pravastatin  40 mg daily Chronic SVT of L small saphenous vein - Not required AC per vascular  FEN/GI: NPO - pre-op  Antiemesis : Zofran  4 mg IV Q6H PRN PPx:SQH Dispo:Pending clinical improvement   Subjective:  Gave PRN Atarax  and Ativan  0.5 mg IV x1 OVN d/t agitation. Rest comfortable in bed. Denies SOB, inc WOB, or any discomfort. No Chest pain. C/o dried mouth d/t NPO status - I provided her some mouth swipe w/ significantly improve.   Objective: Temp:  [98.1 F (36.7 C)-98.3 F (36.8 C)]  98.1 F (36.7 C) (01/26 0451) Pulse Rate:  [74-80] 76 (01/26 0451) Resp:  [16-19] 18 (01/26 0451) BP: (128-152)/(49-71) 151/66 (01/26 0841) SpO2:  [89 %-95 %] 93 % (01/26 0754) Weight:  [80.4 kg] 80.4 kg (01/26 0500)  Physical Exam Constitutional:      Appearance: She is well-developed.  Cardiovascular:      Rate and Rhythm: Normal rate.  Pulmonary:     Effort: Pulmonary effort is normal.     Breath sounds: Examination of the right-lower field reveals decreased breath sounds. Examination of the left-lower field reveals decreased breath sounds. Decreased breath sounds present.  Abdominal:     Palpations: Abdomen is soft.  Neurological:     Mental Status: She is alert and oriented to person, place, and time.  Psychiatric:        Mood and Affect: Mood normal.        Behavior: Behavior normal.      Laboratory: Most recent CBC Lab Results  Component Value Date   WBC 11.0 (H) 10/05/2024   HGB 7.8 (L) 10/05/2024   HCT 23.7 (L) 10/05/2024   MCV 79.0 (L) 10/05/2024   PLT 272 10/05/2024   Most recent BMP    Latest Ref Rng & Units 10/05/2024    5:16 AM  BMP  Glucose 70 - 99 mg/dL 70   BUN 8 - 23 mg/dL 893   Creatinine 9.55 - 1.00 mg/dL 87.99   Sodium 864 - 854 mmol/L 133   Potassium 3.5 - 5.1 mmol/L 4.4   Chloride 98 - 111 mmol/L 90   CO2 22 - 32 mmol/L 17   Calcium  8.9 - 10.3 mg/dL 9.1     Suzen Elder B, DO 10/05/2024, 11:18 AM  PGY-1, [facility] FPTS Intern pager: [phone], text pages welcome Secure chat group [facility] Teaching Service   "

## 2024-10-05 NOTE — Progress Notes (Signed)
 [facility] NEPHROLOGY PROGRESS NOTE  Assessment/ Plan: Pt is a 69 y.o. yo female   # End-stage renal disease: From progressive underlying chronic kidney disease secondary to hypertension, type 2 diabetes mellitus and distant history of ANCA vasculitis.  Started on hemodialysis on 09/26/2024 via temporary right dialysis catheter dialysis catheter and had her third dialysis treatment on 1/20.  Her temporary dialysis catheter was removed on 1/21 and she remains on a catheter holiday.  - -Status post right IJ [facility] and a left radiocephalic AV fistula creation by Dr. Gretta on 1/26. -Plan for dialysis today per MWF schedule.  # Hypertension: Blood pressure and volume status acceptable, UF with HD.  Monitor BP.  # Anemia: Continue ESA, monitor hemoglobin.  No overt blood loss noted.  # Secondary hyperparathyroidism/hyperphosphatemia: Calcium  level acceptable, continue binders for phosphorus.  Renal diet.   # MRSA bacteremia secondary to temporary HD line which was removed, currently on vancomycin  per ID.  Plan for TEE.  Subjective: Seen and examined.  Came back from vascular procedure.  No nausea, vomiting, chest pain or shortness of breath. Objective Vital signs in last 24 hours: Vitals:   10/05/24 1230 10/05/24 1245 10/05/24 1255 10/05/24 1313  BP: (!) 112/57 (!) 120/55 (!) 130/58 128/65  Pulse: 74 72 74   Resp: 19 18 18 15   Temp:   98.5 F (36.9 C) 98.9 F (37.2 C)  TempSrc:    Oral  SpO2: 92% 93% 91% 93%  Weight:      Height:       Weight change: 0.9 kg  Intake/Output Summary (Last 24 hours) at 10/05/2024 1342 Last data filed at 10/05/2024 1131 Gross per 24 hour  Intake 500 ml  Output 400 ml  Net 100 ml       Labs: RENAL PANEL Recent Labs  Lab 10/01/24 0321 10/02/24 0422 10/03/24 0315 10/04/24 0315 10/05/24 0516  NA 130* 129* 131* 130* 133*  K 3.8 3.7 3.6 3.8 4.4  CL 88* 88* 90* 89* 90*  CO2 23 22 17* 19* 17*  GLUCOSE 102* 96 95 89 70  BUN 71* 82*  91* 96* 106*  CREATININE 7.92* 9.04* 10.20* 10.90* 12.00*  CALCIUM  9.0 8.7* 8.5* 8.7* 9.1  PHOS 7.4* 7.2* 7.2* 7.7* 9.2*  ALBUMIN 3.3* 3.2* 3.1* 3.1* 3.2*    Liver Function Tests: Recent Labs  Lab 10/03/24 0315 10/04/24 0315 10/05/24 0516  ALBUMIN 3.1* 3.1* 3.2*   No results for input(s): LIPASE, AMYLASE in the last 168 hours. No results for input(s): AMMONIA in the last 168 hours. CBC: Recent Labs    01/15/24 1319 01/15/24 1320 05/27/24 1434 06/10/24 1159 06/24/24 1150 06/24/24 1154 07/22/24 1229 07/22/24 1235 09/23/24 1243 09/23/24 1312 09/26/24 0343 09/27/24 0401 10/02/24 0422 10/02/24 2159 10/03/24 0315 10/04/24 0315 10/05/24 0122  HGB  --    < >  --    < >  --    < >  --    < >  --    < > 7.3*   < > 6.6* 7.7* 7.4* 7.3* 7.8*  MCV  --    < >  --   --   --    < >  --    < >  --    < > 81.7   < > 81.5 80.1 80.6 79.8* 79.0*  VITAMINB12 505  --   --   --   --   --   --   --   --   --   --   --   --   --   --   --   --  FOLATE 5.9*  --   --   --   --   --   --   --   --   --   --   --   --   --   --   --   --   FERRITIN  --    < > 273  --  253  --  339*  --  737*  --  694*  --   --   --   --   --   --   TIBC  --    < > 259  --  281  --  277  --  231*  --  216*  --   --   --   --   --   --   IRON   --    < > 103  --  57  --  61  --  98  --  56  --   --   --   --   --   --   RETICCTPCT 1.9  --   --   --   --   --   --   --   --   --  2.4  --   --   --   --   --   --    < > = values in this interval not displayed.    Cardiac Enzymes: No results for input(s): CKTOTAL, CKMB, CKMBINDEX, TROPONINI in the last 168 hours. CBG: Recent Labs  Lab 10/04/24 2118 10/05/24 0818 10/05/24 0853 10/05/24 1154 10/05/24 1307  GLUCAP 91 68* 111* 85 114*    Iron  Studies: No results for input(s): IRON , TIBC, TRANSFERRIN, FERRITIN in the last 72 hours. Studies/Results: No results found.  Medications: Infusions:   ceFAZolin  (ANCEF ) IV      Scheduled  Medications:  sodium chloride    Intravenous Once   amLODipine   10 mg Oral Daily   budesonide -glycopyrrolate -formoterol   2 puff Inhalation Daily   calcium  acetate  667 mg Oral TID WC   carvedilol   12.5 mg Oral BID WC   chlorhexidine        Chlorhexidine  Gluconate Cloth  6 each Topical Daily   doxazosin   2 mg Oral Daily   heparin   5,000 Units Subcutaneous Q8H   hydrALAZINE   100 mg Oral Q8H   insulin  aspart  0-15 Units Subcutaneous TID WC   ipratropium-albuterol   3 mL Nebulization BID   pravastatin   40 mg Oral QHS   vancomycin  variable dose per unstable renal function (pharmacist dosing)   Does not apply See admin instructions    have reviewed scheduled and prn medications.  Physical Exam: General:NAD, comfortable Heart:RRR, s1s2 nl Lungs:clear b/l, no crackle Abdomen:soft, Non-tender, non-distended Extremities:No edema Dialysis Access: TDC site clean, AV fistula wound clean.  Riddick Nuon Prasad Jnya Brossard 10/05/2024,1:42 PM  LOS: 9 days

## 2024-10-05 NOTE — Plan of Care (Signed)
   Problem: Education: Goal: Ability to describe self-care measures that may prevent or decrease complications (Diabetes Survival Skills Education) will improve Outcome: Progressing   Problem: Coping: Goal: Ability to adjust to condition or change in health will improve Outcome: Progressing   Problem: Fluid Volume: Goal: Ability to maintain a balanced intake and output will improve Outcome: Progressing

## 2024-10-05 NOTE — Discharge Instructions (Signed)

## 2024-10-06 ENCOUNTER — Inpatient Hospital Stay (HOSPITAL_COMMUNITY)

## 2024-10-06 ENCOUNTER — Encounter (HOSPITAL_COMMUNITY): Payer: Self-pay | Admitting: Vascular Surgery

## 2024-10-06 DIAGNOSIS — R7881 Bacteremia: Secondary | ICD-10-CM | POA: Diagnosis not present

## 2024-10-06 DIAGNOSIS — Z48812 Encounter for surgical aftercare following surgery on the circulatory system: Secondary | ICD-10-CM

## 2024-10-06 DIAGNOSIS — B9562 Methicillin resistant Staphylococcus aureus infection as the cause of diseases classified elsewhere: Secondary | ICD-10-CM | POA: Diagnosis not present

## 2024-10-06 LAB — RENAL FUNCTION PANEL
Albumin: 3.3 g/dL — ABNORMAL LOW (ref 3.5–5.0)
Anion gap: 24 — ABNORMAL HIGH (ref 5–15)
BUN: 111 mg/dL — ABNORMAL HIGH (ref 8–23)
CO2: 18 mmol/L — ABNORMAL LOW (ref 22–32)
Calcium: 9.2 mg/dL (ref 8.9–10.3)
Chloride: 90 mmol/L — ABNORMAL LOW (ref 98–111)
Creatinine, Ser: 12.6 mg/dL — ABNORMAL HIGH (ref 0.44–1.00)
GFR, Estimated: 3 mL/min — ABNORMAL LOW
Glucose, Bld: 135 mg/dL — ABNORMAL HIGH (ref 70–99)
Phosphorus: 8.7 mg/dL — ABNORMAL HIGH (ref 2.5–4.6)
Potassium: 4.8 mmol/L (ref 3.5–5.1)
Sodium: 132 mmol/L — ABNORMAL LOW (ref 135–145)

## 2024-10-06 LAB — TYPE AND SCREEN
ABO/RH(D): A POS
Antibody Screen: NEGATIVE
Unit division: 0
Unit division: 0
Unit division: 0

## 2024-10-06 LAB — BPAM RBC
Blood Product Expiration Date: 202602072359
Blood Product Expiration Date: 202602122359
Blood Product Expiration Date: 202602132359
ISSUE DATE / TIME: 202601172242
ISSUE DATE / TIME: 202601231316
Unit Type and Rh: 6200
Unit Type and Rh: 6200
Unit Type and Rh: 6200

## 2024-10-06 LAB — CBC
HCT: 22 % — ABNORMAL LOW (ref 36.0–46.0)
Hemoglobin: 7.1 g/dL — ABNORMAL LOW (ref 12.0–15.0)
MCH: 26.2 pg (ref 26.0–34.0)
MCHC: 32.3 g/dL (ref 30.0–36.0)
MCV: 81.2 fL (ref 80.0–100.0)
Platelets: 353 10*3/uL (ref 150–400)
RBC: 2.71 MIL/uL — ABNORMAL LOW (ref 3.87–5.11)
RDW: 16.5 % — ABNORMAL HIGH (ref 11.5–15.5)
WBC: 15.8 10*3/uL — ABNORMAL HIGH (ref 4.0–10.5)
nRBC: 0 % (ref 0.0–0.2)

## 2024-10-06 LAB — MINIMUM INHIBITORY CONC. (1 DRUG)

## 2024-10-06 LAB — PREPARE RBC (CROSSMATCH)

## 2024-10-06 LAB — MIC RESULT

## 2024-10-06 LAB — GLUCOSE, CAPILLARY
Glucose-Capillary: 140 mg/dL — ABNORMAL HIGH (ref 70–99)
Glucose-Capillary: 154 mg/dL — ABNORMAL HIGH (ref 70–99)
Glucose-Capillary: 132 mg/dL — ABNORMAL HIGH (ref 70–99)
Glucose-Capillary: 105 mg/dL — ABNORMAL HIGH (ref 70–99)

## 2024-10-06 MED ORDER — SODIUM CHLORIDE 0.9% IV SOLUTION: Freq: Once | INTRAVENOUS | Status: AC

## 2024-10-06 MED ORDER — IOHEXOL 350 MG/ML SOLN
75.0000 mL | Freq: Once | INTRAVENOUS | Status: AC | PRN
  Administered 2024-10-06: 75 mL via INTRAVENOUS

## 2024-10-06 MED ORDER — HEPARIN SODIUM (PORCINE) 1000 UNIT/ML IJ SOLN
INTRAMUSCULAR | Status: AC
  Filled 2024-10-06: qty 4

## 2024-10-06 MED ORDER — HEPARIN SODIUM (PORCINE) 1000 UNIT/ML DIALYSIS
1000.0000 [IU] | INTRAMUSCULAR | Status: DC | PRN
  Administered 2024-10-06: 1000 [IU]

## 2024-10-06 MED ORDER — CALCIUM ACETATE (PHOS BINDER) 667 MG PO CAPS
1334.0000 mg | ORAL_CAPSULE | Freq: Three times a day (TID) | ORAL | Status: DC
  Administered 2024-10-07 – 2024-10-08 (×3): 1334 mg via ORAL
  Filled 2024-10-06 (×3): qty 2

## 2024-10-06 MED ORDER — VANCOMYCIN HCL 750 MG/150ML IV SOLN
750.0000 mg | Freq: Once | INTRAVENOUS | Status: AC
  Administered 2024-10-06: 750 mg via INTRAVENOUS
  Filled 2024-10-06: qty 150

## 2024-10-06 MED ORDER — DIPHENHYDRAMINE-ZINC ACETATE 2-0.1 % EX CREA
1.0000 | TOPICAL_CREAM | Freq: Two times a day (BID) | CUTANEOUS | Status: DC | PRN
  Filled 2024-10-06: qty 28

## 2024-10-06 MED ORDER — ALTEPLASE 2 MG IJ SOLR: 2.0000 mg | Freq: Once | INTRAMUSCULAR | Status: DC | PRN

## 2024-10-06 MED ORDER — VANCOMYCIN HCL 750 MG/150ML IV SOLN
750.0000 mg | INTRAVENOUS | Status: DC
  Administered 2024-10-08: 750 mg via INTRAVENOUS
  Filled 2024-10-06: qty 150

## 2024-10-06 MED ORDER — VANCOMYCIN HCL 750 MG/150ML IV SOLN: 750.0000 mg | INTRAVENOUS | Status: DC

## 2024-10-06 NOTE — Progress Notes (Signed)
 Documentation per rads request for clearance of CT neck w/ contrast. Chart has been reviewed: Given that this patient is now listed as end stage renal disease and given the necessity of this scan, it is acceptable to proceed with contrasted scan since we are not looking for renal recovery at this junction. She has also completed HD earlier today with net UF of 1.5L and looking at her flow sheets, she is currently on room air. Would recommend utilizing lowest amount of contrast if feasible. Discussed with primary service.  Ephriam Stank, MD [facility]

## 2024-10-06 NOTE — Progress Notes (Signed)
 "    Daily Progress Note Intern Pager: [phone]  Patient name: Catherine Aguilar Medical record number: [id number] Date of birth: Jan 09, 1956 Age: 69 y.o. Gender: female  Primary Care Provider: Adele Song, MD Consultants: Nephrology, cardiology, VVS, ENT, ID Code Status: Full  Pt Overview and Major Events to Date:  01/17: Admitted for CHF exacerbation secondary to progression of ESRD ISO medication nonadherence, temporary HD catheter placed 01/18: Hemodialysis initiated 01/19: Rapid response called x 2 due to shortness of breath and increased work of breathing 01/20: Rapid response called overnight for increased work of breathing 01/21: Fiberoptic laryngoscopy by ENT showing left vocal cord paralysis; temp hemodialysis catheter removed secondary to MRSA bacteremia 01/22: Chronic SVT of L small saphenous vein identified on LE DVT US  01/26: Tunneled HD catheter procedure per VVS performed  01/27: Plan for 13:00 for MBS and 13:30 for esophagram post HD 01/28: Plan for TEE per ID  Assessment and Plan:  Catherine Aguilar is a 69yo F with past medical history of type 2 diabetes, HTN, CKD stage V, anemia of chronic disease, COPD, with pneumatosis with renal involvement admitted for CHF exacerbation secondary to progression of her ESRD ISO medication nonadherence.  Hospital course has been complicated by MRSA bacteremia likely from temporary HD catheter (removed as of 01/21) and episodes of severe shortness of breath and difficulty breathing with new diagnosis of unilateral vocal cord paralysis.  Blood cultures since removal of HD catheter no growth x 4 days.  Patient is clinically improving with plans for MBS, esophagram, TEE and restarting dialysis this week. Assessment & Plan MRSA bacteremia Aspiration pneumonia of both lungs (HCC) Blood Cx neg to date. AM WBC 11 from 12.9 yesterday - Continue contact isolation (+ MRSA)  - ID consulted, appreciate recommendations - Continue vancomycin  (1/19 -  ) - Completed PO Augmentin  250-125 mg q12hrs (1/20-1/24) for PNA - Recommend TEE given persistent leukocytosis, planned for 01/28  - Daily CBC ESRD on hemodialysis (HCC) Anemia in chronic kidney disease (CKD) Secondary hyperparathyroidism HgB 7.1 this morning, last dose ESA on 01/25. - Nephrology consulted, appreciate recommendations. - First dialysis since [facility] placement today, 01/27 - Continue phosphate binder calcium  acetate 667mg  TID with meals - Transfusion threshold <8    - Unfortunately unable to transfuse during HD today. Will follow hgb and transfuse tomorrow if needed. - VVS consulted, appreciate recommendations (s/o 01/27)  - Follow up in 6 weeks to check maturation of AVF - Continue Tylenol  650 mg q6hrs PRN PO or rectal for pain - Avoid nephrotoxic agents - Strict I&Os, daily weights - Daily RFP COPD (chronic obstructive pulmonary disease) (HCC) On RA, denies SOB. Breathing overall much improved since admission.  - Continue home Breztri  inhaler 2 puffs daily  - Continue DuoNebs BID scheduled and q2h PRN - Benzonatate  200 mg TID PRN for cough Acute on chronic congestive heart failure (HCC) Essential hypertension CAD (coronary artery disease) AM Weight 80.4kg ( 83 kg on admission) - Continue cardiac monitoring  - Cardiology consulted, Appreciated recommendation  - Plan for TEE after iHD placement and Dysphagia evaluation; barium swallow/esophogram prior to TEE 01/27  - Continue home Coreg  12.5 mg BID, Hydralazine  100 mg TID, Amlodipine  10 mg Daily, and Doxazosin  2 mg Daily Left vocal cord paralysis - ENT consulted, appreciate recommendations - CT neck with contrast once patient is able to resume dialysis, possibly today 01/27 Chronic health problem DMII: BCG ACHS, Continue SSI HLD: continue home Pravastatin  40 mg daily Chronic SVT of L small saphenous vein -  Not required [facility] per vascular  FEN/GI: Renal diet with fluid restriction PPx: Heparin  Dispo:Home  pending clinical improvement . Barriers include MBS, esophagram, TEE, blood transfusion, .   Subjective:  Patient was seen and examined at bedside. She seems the most comfortable I have seen her this admission. Has no complaints today. Breathing comfortably without stridor.   Objective: Temp:  [97.7 F (36.5 C)-99.8 F (37.7 C)] 98.3 F (36.8 C) (01/27 0737) Pulse Rate:  [72-88] 88 (01/27 0737) Resp:  [15-21] 18 (01/27 0737) BP: (111-151)/(55-66) 133/56 (01/27 0737) SpO2:  [86 %-99 %] 97 % (01/27 0737) Physical Exam: General: at HD, lying comfortably in hospital bed in no acute distress Cardiovascular: RRR, no m/r/g Respiratory: CTAB, normal WOB on RA, no stridor/w/r/r Abdomen: soft, nontender, nondistended, hypoactive bowel sounds Extremities: no peripheral edema, moves all extremities equally  Laboratory: Most recent CBC Lab Results  Component Value Date   WBC 15.8 (H) 10/06/2024   HGB 7.1 (L) 10/06/2024   HCT 22.0 (L) 10/06/2024   MCV 81.2 10/06/2024   PLT 353 10/06/2024   Most recent BMP    Latest Ref Rng & Units 10/06/2024    3:30 AM  BMP  Glucose 70 - 99 mg/dL 864   BUN 8 - 23 mg/dL 888   Creatinine 9.55 - 1.00 mg/dL 87.39   Sodium 864 - 854 mmol/L 132   Potassium 3.5 - 5.1 mmol/L 4.8   Chloride 98 - 111 mmol/L 90   CO2 22 - 32 mmol/L 18   Calcium  8.9 - 10.3 mg/dL 9.2    Catherine Credit, DO 10/06/2024, 8:03 AM  PGY-1, [facility] FPTS Intern pager: [phone], text pages welcome Secure chat group [facility] Teaching Service   "

## 2024-10-06 NOTE — Assessment & Plan Note (Addendum)
 Blood Cx neg to date. AM WBC 11 from 12.9 yesterday - Continue contact isolation (+ MRSA)  - ID consulted, appreciate recommendations - Continue vancomycin  (1/19 - ) - Completed PO Augmentin  250-125 mg q12hrs (1/20-1/24) for PNA - Recommend TEE given persistent leukocytosis, planned for 01/28  - Daily CBC

## 2024-10-06 NOTE — Assessment & Plan Note (Addendum)
-   ENT consulted, appreciate recommendations - CT neck with contrast once patient is able to resume dialysis, possibly today 01/27

## 2024-10-06 NOTE — Progress Notes (Signed)
" °  Postoperative hemodialysis access     Date of Surgery:  10/05/2024 Surgeon: Gretta  Subjective:  says she doesn't have any pain  PHYSICAL EXAMINATION:  Vitals:   10/06/24 0104 10/06/24 0450  BP: (!) 111/58 133/61  Pulse: 88   Resp: 16 17  Temp: 98.3 F (36.8 C) 98.2 F (36.8 C)  SpO2: 95% 95%    Incision is clean and dry Sensation in digits is intact;  There is a faint thrill  There is bruit. The fistula is palpable  The radial pulse is palpable   ASSESSMENT/PLAN:  Catherine Aguilar is a 69 y.o. year old female who is s/p left RC AVF and TDC placement on 10/05/2024 by Dr. Gretta.  - fistula has a faint thrill and excellent bruit and is patent -pt does not have evidence of steal sx -f/u with VVS in 6 weeks to check maturation of AVF -discussed with pt that access may need further procedures and possibly new access in the future.   -will sign off-call as needed.   Lucie Apt, PA-C [facility] [phone]  "

## 2024-10-06 NOTE — Progress Notes (Signed)
 Pharmacy Antibiotic Note  Catherine Aguilar is a 69 y.o. female admitted on 09/25/2024 with pneumonia and bacteremia.  Pharmacy has been consulted for Vancomycin  dosing.  Assessment:  Vancomycin  random level was therapeutic at 20 mcg/mL (10/05/24)  Patient did not go to HD on 10/05/24, therefore, no dose given  Patient receiving HD today (10/06/24), therefore, plan to give vancomycin  dose x1 post-HD Appears nephrology will continue with TTS HD schedule  Plan: Vancomycin  IV 750 mg x1 dose with HD today  Vancomycin  IV 750 mg post-HD maintenance doses (current tentative HD schedule is TTS per nephrology)  Continue to monitor renal function, HD recommendations per Nephrology, clinical progression, and infectious workup  Height: 5' 7 (170.2 cm) Weight: (S) 80.2 kg (176 lb 12.9 oz) (Bed Scale) IBW/kg (Calculated) : 61.6  Temp (24hrs), Avg:98.4 F (36.9 C), Min:97.7 F (36.5 C), Max:99.8 F (37.7 C)  Recent Labs  Lab 10/02/24 0422 10/02/24 2159 10/03/24 0315 10/04/24 0315 10/05/24 0122 10/05/24 0516 10/05/24 1342 10/06/24 0330  WBC 23.2* 19.7* 18.2* 12.9* 11.0*  --   --  15.8*  CREATININE 9.04*  --  10.20* 10.90*  --  12.00*  --  12.60*  VANCORANDOM 17  --   --   --   --   --  20  --     Estimated Creatinine Clearance: 4.6 mL/min (A) (by C-G formula based on SCr of 12.6 mg/dL (H)).    Allergies[1]  Antimicrobials this admission: VAN 1750 mg x 1 on 1/19 VAN 750 mg x1 on 1/23 Augmentin  01/20 >> 01/25 Zosyn  01/19 >> 01/20  Microbiology results: 1/16 RP(-) 1/19 GPC 2/4, BCID = MRSA 1/20 MRSA PCR+ 1/21 bcx x4 = ngtd 1/23 bcx = ngtd  Thank you for allowing pharmacy to be a part of this patients care.  Feliciano Close, PharmD PGY2 Infectious Diseases Pharmacy Resident          [1] No Known Allergies

## 2024-10-06 NOTE — Assessment & Plan Note (Addendum)
 HgB 7.1 this morning, last dose ESA on 01/25. - Nephrology consulted, appreciate recommendations. - First dialysis since [facility] placement today, 01/27 - Continue phosphate binder calcium  acetate 667mg  TID with meals - Transfusion threshold <8    - Unfortunately unable to transfuse during HD today. Will follow hgb and transfuse tomorrow if needed. - VVS consulted, appreciate recommendations (s/o 01/27)  - Follow up in 6 weeks to check maturation of AVF - Continue Tylenol  650 mg q6hrs PRN PO or rectal for pain - Avoid nephrotoxic agents - Strict I&Os, daily weights - Daily RFP

## 2024-10-06 NOTE — Progress Notes (Incomplete)
 "                                                            [facility] Infectious Diseases Follow Up Note  Patient Identification: Patient Name: Catherine Aguilar MRN: [id number] Admit Date: 09/25/2024  9:21 PM Age: 69 y.o.Today's Date: 10/06/2024  Reason for Visit: Follow-up on MRSA bacteremia  Principal Problem:   MRSA bacteremia Active Problems:   Essential hypertension   Chronic kidney disease (CKD), stage IV (severe) (HCC)   DM2 (diabetes mellitus, type 2) (HCC)   COPD (chronic obstructive pulmonary disease) (HCC)   Anemia in chronic kidney disease (CKD)   Stage 5 chronic kidney disease not on chronic dialysis (HCC)   Cardiorenal syndrome with renal failure   Volume overload   Chronic health problem   Elevated d-dimer   Elevated troponin   Demand ischemia (HCC)   Acute on chronic congestive heart failure (HCC)   Multifocal pneumonia   Shortness of breath   Chronic superficial vein thrombosis involving L small saphenous vein   Bloodstream infection due to central venous catheter   ESRD on hemodialysis (HCC)   Aspiration pneumonia of both lungs (HCC)   Left vocal cord paralysis   Secondary hyperparathyroidism   CAD (coronary artery disease)   Antibiotics:  Vancomycin  with HD 1/19- Augmentin  1/20-1/24  Lines/Hardwares: Right IJ tunneled HDC, left cephalic AV fistula  Interval Events: Remains afebrile Labs remarkable for WBC down to 12.5, hemoglobin 7.2  Assessment 69 year old female with prior history as below including type II DM, CKD, COPD, WG with renal involvement, CKD initially admitted for CHF exacerbation secondary to progression of CKD to ESRD in the setting of medication nonadherence.  Hospital course complicated with MRSA bacteremia secondary to temporary HD catheter as well as bilateral vocal cord paralysis.  # MRSA bacteremia/CLABSI - Blood cx 1/21 all sets and 1/23 NGTD - 1/18 TTE no vegetations/endocarditis  - HDC removed on 1/21 - On HDC placed 1/26 - TEE  no vegetation or concern for endocarditis  # Esophageal stricture  - GI consulted - s/p EGD 1/28 tortuous esophagus.  Dilated to 16 mm savory with mild metric mucosal disruption just about EG junction 1 cm hiatal hernia, gastritis, biopsied, granular gastric mucosa at prepylorus, biopsied  # Probable Aspiration pna - s/p 5 days of PO  augmentin   # Leukocytosis - 2/2 above  # Left vocal cord paralysis - Evaluated by ENT  - CT neck medialization of the left true vocal cord toward midline which could reflect paresis/palsy.  Patchy and dense nodular opacities within the visualized left upper lobe concerning for pneumonia, associated small left pleural effusion - per ENT  Recommendations - Continue vancomycin  with HD, plan for 4 weeks course through 2/18 * addendum. - Monitor CBC and BMP at least weekly  - FU path from EGD - Contact precautions  - Fu appt with [facility] on 2/18 at 9 AM ID will sign off.  Rest of the management as per the primary team. Thank you for the consult. Please page with pertinent questions or concerns.  ______________________________________________________________________ Subjective patient seen and examined at the bedside.  States hungry.   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   CVA (cerebral vascular accident) (HCC) 07/03/2012   Diffuse pulmonary alveolar hemorrhage    Edema of right lower extremity 03/25/2017   Elevated d-dimer 09/26/2024   Essential hypertension 12/08/2016   Hemoptysis 12/08/2016   High cholesterol    HLD (hyperlipidemia) 12/08/2016   Hx  of adenomatous polyp of colon 04/16/2017   Hypertension    stopped taking medications 10 years ago   Hypokalemia 07/02/2012   Intra-alveolar hemorrhage 12/08/2016   Left facial numbness 07/02/2012   Sepsis (HCC) 12/08/2016   Shortness of breath 12/02/2017   Stroke (HCC)    Substance abuse (HCC) 12/08/2016   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   Tobacco abuse 12/08/2016   Type 2 diabetes mellitus without complication, with long-term current use of insulin  (HCC) 02/11/2017   Wegener's granulomatosis with renal involvement (HCC) 12/08/2016   Past Surgical History:  Procedure Laterality Date   ABDOMINAL HYSTERECTOMY  1985   INSERTION OF ARTERIOVENOUS (AV) ARTEGRAFT ARM Left 10/05/2024   Procedure: INSERTION OF LEFT ARM RADIOCEPHALIC  ARTERIOVENOUS FISTULA;  Surgeon: Gretta Lonni PARAS, MD;  Location: [facility] OR;  Service: Vascular;  Laterality: Left;   INSERTION OF DIALYSIS CATHETER Right 10/05/2024   Procedure: INSERTION OF RIGHT INTERNAL JUGULAR DIALYSIS CATHETER;  Surgeon: Gretta Lonni PARAS, MD;  Location: [facility] OR;  Service: Vascular;  Laterality: Right;  TUNNELED   IR TUNNELED CENTRAL VENOUS CATH PLC W IMG  09/26/2024   Vitals BP (!) 133/56 (BP Location: Right Arm)   Pulse 88   Temp 98.3 F (36.8 C) (Oral)   Resp 18   Ht 5' 7 (1.702 m)   Wt 80.4 kg   SpO2 97%   BMI 27.76 kg/m     Physical Exam Constitutional: Elderly female lying in the bed, nontoxic-appearing    Comments: HEENT WNL  Cardiovascular:     Rate and Rhythm: Normal rate    Heart sounds:  Pulmonary:     Effort: Pulmonary effort is normal.     Comments:  Abdominal:     Palpations:    Tenderness: Nondistended  Musculoskeletal:        General: No swelling or tenderness in peripheral joints  Skin:    Comments: Right chest HDC with bandage intact  Neurological:     General: Awake, alert and oriented  Psychiatric:        Mood and Affect: Mood normal.   Pertinent Microbiology Results for  orders placed or performed during the hospital encounter of 09/25/24  Resp panel by RT-PCR (RSV, Flu A&B, Covid) Anterior Nasal Swab     Status: None   Collection Time: 09/25/24  9:34 PM   Specimen: Anterior Nasal Swab  Result Value Ref Range Status   SARS Coronavirus 2 by RT PCR NEGATIVE NEGATIVE Final   Influenza A by PCR NEGATIVE NEGATIVE Final   Influenza B by PCR NEGATIVE NEGATIVE Final    Comment: (NOTE) The Xpert Xpress SARS-CoV-2/FLU/RSV plus assay is intended as an aid in the diagnosis of influenza from Nasopharyngeal swab specimens and should not be used as a sole basis for treatment. Nasal washings and aspirates are unacceptable for Xpert Xpress SARS-CoV-2/FLU/RSV testing.  Fact Sheet for Patients: bloggercourse.com  Fact Sheet for Healthcare  Providers: seriousbroker.it  This test is not yet approved or cleared by the [place]  FDA and has been authorized for detection and/or diagnosis of SARS-CoV-2 by FDA under an Emergency Use Authorization (EUA). This EUA will remain in effect (meaning this test can be used) for the duration of the COVID-19 declaration under Section 564(b)(1) of the Act, 21 U.S.C. section 360bbb-3(b)(1), unless the authorization is terminated or revoked.     Resp Syncytial Virus by PCR NEGATIVE NEGATIVE Final    Comment: (NOTE) Fact Sheet for Patients: bloggercourse.com  Fact Sheet for Healthcare Providers: seriousbroker.it  This test is not yet approved or cleared by the [place]  FDA and has been authorized for detection and/or diagnosis of SARS-CoV-2 by FDA under an Emergency Use Authorization (EUA). This EUA will remain in effect (meaning this test can be used) for the duration of the COVID-19 declaration under Section 564(b)(1) of the Act, 21 U.S.C. section 360bbb-3(b)(1), unless the authorization is terminated  or revoked.  Performed at [facility], [address], [place], [place] [address]   Respiratory (~20 pathogens) panel by PCR     Status: None   Collection Time: 09/28/24  3:40 PM   Specimen: Nasopharyngeal Swab; Respiratory  Result Value Ref Range Status   Adenovirus NOT DETECTED NOT DETECTED Final   Coronavirus 229E NOT DETECTED NOT DETECTED Final    Comment: (NOTE) The Coronavirus on the Respiratory Panel, DOES NOT test for the novel  Coronavirus (2019 nCoV)    Coronavirus HKU1 NOT DETECTED NOT DETECTED Final   Coronavirus NL63 NOT DETECTED NOT DETECTED Final   Coronavirus OC43 NOT DETECTED NOT DETECTED Final   Metapneumovirus NOT DETECTED NOT DETECTED Final   Rhinovirus / Enterovirus NOT DETECTED NOT DETECTED Final   Influenza A NOT DETECTED NOT DETECTED Final   Influenza B NOT DETECTED NOT DETECTED Final   Parainfluenza Virus 1 NOT DETECTED NOT DETECTED Final   Parainfluenza Virus 2 NOT DETECTED NOT DETECTED Final   Parainfluenza Virus 3 NOT DETECTED NOT DETECTED Final   Parainfluenza Virus 4 NOT DETECTED NOT DETECTED Final   Respiratory Syncytial Virus NOT DETECTED NOT DETECTED Final   Bordetella pertussis NOT DETECTED NOT DETECTED Final   Bordetella Parapertussis NOT DETECTED NOT DETECTED Final   Chlamydophila pneumoniae NOT DETECTED NOT DETECTED Final   Mycoplasma pneumoniae NOT DETECTED NOT DETECTED Final    Comment: Performed at [facility], [address] [place], [place] [address]  Culture, blood (Routine X 2) w Reflex to ID Panel     Status: Abnormal   Collection Time: 09/28/24  7:00 PM   Specimen: BLOOD  Result Value Ref Range Status   Specimen Description BLOOD SITE NOT SPECIFIED  Final   Special Requests   Final    BOTTLES DRAWN AEROBIC AND ANAEROBIC Blood Culture adequate volume   Culture  Setup Time   Final    GRAM POSITIVE COCCI AEROBIC BOTTLE ONLY CRITICAL RESULT CALLED TO, READ BACK BY AND VERIFIED WITH: PHARMD Damien RAMAN on 012026 @1425  by  SM Performed at [facility], [address], [place], [place] [address]    Culture METHICILLIN RESISTANT STAPHYLOCOCCUS AUREUS (A)  Final   Report Status 10/01/2024 FINAL  Final   Organism ID, Bacteria METHICILLIN RESISTANT STAPHYLOCOCCUS AUREUS  Final      Susceptibility   Methicillin resistant staphylococcus aureus - MIC*    CIPROFLOXACIN >=8 RESISTANT Resistant     ERYTHROMYCIN >=8 RESISTANT Resistant     GENTAMICIN <=0.5  SENSITIVE Sensitive     OXACILLIN >=4 RESISTANT Resistant     TETRACYCLINE <=1 SENSITIVE Sensitive     VANCOMYCIN  <=0.5 SENSITIVE Sensitive     TRIMETH /SULFA  >=320 RESISTANT Resistant     CLINDAMYCIN >=8 RESISTANT Resistant     RIFAMPIN <=0.5 SENSITIVE Sensitive     Inducible Clindamycin NEGATIVE Sensitive     LINEZOLID 2 SENSITIVE Sensitive     * METHICILLIN RESISTANT STAPHYLOCOCCUS AUREUS  Blood Culture ID Panel (Reflexed)     Status: Abnormal   Collection Time: 09/28/24  7:00 PM  Result Value Ref Range Status   Enterococcus faecalis NOT DETECTED NOT DETECTED Final   Enterococcus Faecium NOT DETECTED NOT DETECTED Final   Listeria monocytogenes NOT DETECTED NOT DETECTED Final   Staphylococcus species DETECTED (A) NOT DETECTED Final    Comment: CRITICAL RESULT CALLED TO, READ BACK BY AND VERIFIED WITH: PHARMD Damien RAMAN on 012026 @1425  by SM    Staphylococcus aureus (BCID) DETECTED (A) NOT DETECTED Final    Comment: Methicillin (oxacillin)-resistant Staphylococcus aureus (MRSA). MRSA is predictably resistant to beta-lactam antibiotics (except ceftaroline). Preferred therapy is vancomycin  unless clinically contraindicated. Patient requires contact precautions if  hospitalized. CRITICAL RESULT CALLED TO, READ BACK BY AND VERIFIED WITH: PHARMD Damien RAMAN on 012026 @1425  by SM    Staphylococcus epidermidis NOT DETECTED NOT DETECTED Final   Staphylococcus lugdunensis NOT DETECTED NOT DETECTED Final   Streptococcus species NOT DETECTED NOT DETECTED Final    Streptococcus agalactiae NOT DETECTED NOT DETECTED Final   Streptococcus pneumoniae NOT DETECTED NOT DETECTED Final   Streptococcus pyogenes NOT DETECTED NOT DETECTED Final   A.calcoaceticus-baumannii NOT DETECTED NOT DETECTED Final   Bacteroides fragilis NOT DETECTED NOT DETECTED Final   Enterobacterales NOT DETECTED NOT DETECTED Final   Enterobacter cloacae complex NOT DETECTED NOT DETECTED Final   Escherichia coli NOT DETECTED NOT DETECTED Final   Klebsiella aerogenes NOT DETECTED NOT DETECTED Final   Klebsiella oxytoca NOT DETECTED NOT DETECTED Final   Klebsiella pneumoniae NOT DETECTED NOT DETECTED Final   Proteus species NOT DETECTED NOT DETECTED Final   Salmonella species NOT DETECTED NOT DETECTED Final   Serratia marcescens NOT DETECTED NOT DETECTED Final   Haemophilus influenzae NOT DETECTED NOT DETECTED Final   Neisseria meningitidis NOT DETECTED NOT DETECTED Final   Pseudomonas aeruginosa NOT DETECTED NOT DETECTED Final   Stenotrophomonas maltophilia NOT DETECTED NOT DETECTED Final   Candida albicans NOT DETECTED NOT DETECTED Final   Candida auris NOT DETECTED NOT DETECTED Final   Candida glabrata NOT DETECTED NOT DETECTED Final   Candida krusei NOT DETECTED NOT DETECTED Final   Candida parapsilosis NOT DETECTED NOT DETECTED Final   Candida tropicalis NOT DETECTED NOT DETECTED Final   Cryptococcus neoformans/gattii NOT DETECTED NOT DETECTED Final   Meth resistant mecA/C and MREJ DETECTED (A) NOT DETECTED Final    Comment: CRITICAL RESULT CALLED TO, READ BACK BY AND VERIFIED WITH: PHARMD Damien RAMAN on 012026 @1425  by SM Performed at [facility], [address], [place], [place] [address]   MIC (1 Drug)-     Status: Abnormal   Collection Time: 09/28/24  7:00 PM  Result Value Ref Range Status   Min Inhibitory Conc (1 Drug) Preliminary report (A)  Final    Comment: (NOTE) Performed At: [facility] [address] [place], [place] [id number] Jennette Shorter MD  Ey:[phone]    Source LAB [id number] MRSA BLOOD DAPTOMYCIN  Final    Comment: Performed at [facility], [address] [place], [place] [address]  MIC Result     Status: Abnormal   Collection Time: 09/28/24  7:00 PM  Result Value Ref Range Status   Result 1 (MIC) Comment (A)  Final    Comment: (NOTE) Methicillin - resistant Staphylococcus aureus Identification performed by account, not confirmed by this laboratory. DAPTOMYCIN Performed At: [facility] [address] [place], [place] [id number] Jennette Shorter MD Ey:[phone]   Culture, blood (Routine X 2) w Reflex to ID Panel     Status: Abnormal   Collection Time: 09/28/24  7:02 PM   Specimen: BLOOD  Result Value Ref Range Status   Specimen Description BLOOD SITE NOT SPECIFIED  Final   Special Requests   Final    BOTTLES DRAWN AEROBIC AND ANAEROBIC Blood Culture adequate volume   Culture  Setup Time   Final    GRAM POSITIVE COCCI IN CLUSTERS ANAEROBIC BOTTLE ONLY CRITICAL VALUE NOTED.  VALUE IS CONSISTENT WITH PREVIOUSLY REPORTED AND CALLED VALUE.    Culture (A)  Final    STAPHYLOCOCCUS AUREUS SUSCEPTIBILITIES PERFORMED ON PREVIOUS CULTURE WITHIN THE LAST 5 DAYS. Performed at [facility], [address], [place], [place] [address]    Report Status 10/01/2024 FINAL  Final  MRSA Next Gen by PCR, Nasal     Status: Abnormal   Collection Time: 09/29/24  7:13 AM   Specimen: Nasal Mucosa; Nasal Swab  Result Value Ref Range Status   MRSA by PCR Next Gen DETECTED (A) NOT DETECTED Final    Comment: RESULT CALLED TO, READ BACK BY AND VERIFIED WITH: RN CHRISTELLA HALEY [id number] AT 1136 BY CM (NOTE) The GeneXpert MRSA Assay (FDA approved for NASAL specimens only), is one component of a comprehensive MRSA colonization surveillance program. It is not intended to diagnose MRSA infection nor to guide or monitor treatment for MRSA infections. Test performance is not FDA approved in patients less than 33 years old. Performed  at [facility], [address] [place], [place] [address]   Culture, blood (Routine X 2) w Reflex to ID Panel     Status: None   Collection Time: 09/30/24  7:16 AM   Specimen: BLOOD RIGHT ARM  Result Value Ref Range Status   Specimen Description BLOOD RIGHT ARM  Final   Special Requests   Final    BOTTLES DRAWN AEROBIC AND ANAEROBIC Blood Culture adequate volume   Culture   Final    NO GROWTH 5 DAYS Performed at [facility], [address], [place], [place] [address]    Report Status 10/05/2024 FINAL  Final  Culture, blood (Routine X 2) w Reflex to ID Panel     Status: None   Collection Time: 09/30/24  7:25 AM   Specimen: BLOOD RIGHT ARM  Result Value Ref Range Status   Specimen Description BLOOD RIGHT ARM  Final   Special Requests   Final    BOTTLES DRAWN AEROBIC AND ANAEROBIC Blood Culture adequate volume   Culture   Final    NO GROWTH 5 DAYS Performed at [facility], [address] [place], [place] [address]    Report Status 10/05/2024 FINAL  Final  Culture, blood (Routine X 2) w Reflex to ID Panel     Status: None   Collection Time: 09/30/24 12:12 PM   Specimen: BLOOD RIGHT HAND  Result Value Ref Range Status   Specimen Description BLOOD RIGHT HAND  Final   Special Requests   Final  BOTTLES DRAWN AEROBIC AND ANAEROBIC Blood Culture adequate volume   Culture   Final    NO GROWTH 5 DAYS Performed at [facility], [address] [place], [place] [address]    Report Status 10/05/2024 FINAL  Final  Culture, blood (Routine X 2) w Reflex to ID Panel     Status: None   Collection Time: 09/30/24 12:14 PM   Specimen: BLOOD LEFT HAND  Result Value Ref Range Status   Specimen Description BLOOD LEFT HAND  Final   Special Requests   Final    BOTTLES DRAWN AEROBIC AND ANAEROBIC Blood Culture adequate volume   Culture   Final    NO GROWTH 5 DAYS Performed at [facility], [address] [place], [place] [address]    Report Status 10/05/2024  FINAL  Final  Culture, blood (Routine X 2) w Reflex to ID Panel     Status: None (Preliminary result)   Collection Time: 10/02/24  9:48 PM   Specimen: BLOOD  Result Value Ref Range Status   Specimen Description BLOOD LEFT ANTECUBITAL  Final   Special Requests   Final    BOTTLES DRAWN AEROBIC AND ANAEROBIC Blood Culture adequate volume   Culture   Final    NO GROWTH 4 DAYS Performed at [facility], [address], [place], [place] [address]    Report Status PENDING  Incomplete  Culture, blood (Routine X 2) w Reflex to ID Panel     Status: None (Preliminary result)   Collection Time: 10/02/24  9:59 PM   Specimen: BLOOD LEFT HAND  Result Value Ref Range Status   Specimen Description BLOOD LEFT HAND  Final   Special Requests   Final    BOTTLES DRAWN AEROBIC AND ANAEROBIC Blood Culture adequate volume   Culture   Final    NO GROWTH 4 DAYS Performed at [facility], [address] [place], [place] [address]    Report Status PENDING  Incomplete   Pertinent Lab.    Latest Ref Rng & Units 10/06/2024    3:30 AM 10/05/2024    1:22 AM 10/04/2024    3:15 AM  CBC  WBC 4.0 - 10.5 K/uL 15.8  11.0  12.9   Hemoglobin 12.0 - 15.0 g/dL 7.1  7.8  7.3   Hematocrit 36.0 - 46.0 % 22.0  23.7  22.1   Platelets 150 - 400 K/uL 353  272  228       Latest Ref Rng & Units 10/06/2024    3:30 AM 10/05/2024    5:16 AM 10/04/2024    3:15 AM  CMP  Glucose 70 - 99 mg/dL 864  70  89   BUN 8 - 23 mg/dL 888  893  96   Creatinine 0.44 - 1.00 mg/dL 87.39  87.99  89.09   Sodium 135 - 145 mmol/L 132  133  130   Potassium 3.5 - 5.1 mmol/L 4.8  4.4  3.8   Chloride 98 - 111 mmol/L 90  90  89   CO2 22 - 32 mmol/L 18  17  19    Calcium  8.9 - 10.3 mg/dL 9.2  9.1  8.7      Pertinent Imaging today Plain films and CT images have been personally visualized and interpreted; radiology reports have been reviewed. Decision making incorporated into the Impression /   DG CHEST PORT 1 VIEW Result Date:  10/05/2024 CLINICAL DATA:  Status post dialysis catheter placement EXAM:  PORTABLE CHEST 1 VIEW COMPARISON:  Chest radiograph dated 09/28/2024 FINDINGS: Lines/tubes: Right internal jugular venous catheter tip projects over the right atrium. Lungs: Mildly low lung volumes. Bilateral interstitial opacities, left-greater-than-right. Increased patchy and confluent left lung opacities. Pleura: Moderate to large left pleural effusion.  No pneumothorax. Heart/mediastinum: Similar enlarged cardiomediastinal silhouette. Aortic atherosclerosis. Bones: No acute osseous abnormality. IMPRESSION: 1. Right internal jugular venous catheter tip projects over the right atrium. No pneumothorax. 2. Moderate to large left pleural effusion. 3. Bilateral interstitial opacities, left-greater-than-right, likely pulmonary edema. 4. Confluent and patchy left lung opacities may represent superimposed atelectasis or pneumonia. Electronically Signed   By: Limin  Xu M.D.   On: 10/05/2024 14:00   DG C-Arm 1-60 Min Result Date: 10/05/2024 CLINICAL DATA:  69 year old female undergoing placement of tunneled hemodialysis catheter. EXAM: DG C-ARM 1-60 MIN CONTRAST:  Not applicable. FLUOROSCOPY: 6.31 mGy reference air kerma COMPARISON:  09/26/2024 FINDINGS: Single fluoroscopic image demonstrates placement of a tunneled hemodialysis catheter with the catheter tip in the right atrium. IMPRESSION: Single fluoroscopic image demonstrates placement of tunneled hemodialysis catheter. Please refer to the operative report for further details. Electronically Signed   By: Ester Sides M.D.   On: 10/05/2024 13:44   VAS US  LOWER EXTREMITY VENOUS (DVT) Result Date: 09/29/2024  Lower Venous DVT Study Patient Name:  GRACYN ALLOR  Date of Exam:   09/29/2024 Medical Rec #: [id number]        Accession #:    [id number] Date of Birth: 02-21-1956        Patient Gender: F Patient Age:   26 years Exam Location:  [facility] Procedure:      VAS US  LOWER EXTREMITY  VENOUS (DVT) Referring Phys: LAMAR PATERSON --------------------------------------------------------------------------------  Indications: Edema, and SOB. Other Indications: Prior CVA. Limitations: Limited exam due to patient's uncooperativeness. Comparison Study: Previous study of the right lower extremity on 7.18.2018. Performing Technologist: Edilia Elden Appl  Examination Guidelines: A complete evaluation includes B-mode imaging, spectral Doppler, color Doppler, and power Doppler as needed of all accessible portions of each vessel. Bilateral testing is considered an integral part of a complete examination. Limited examinations for reoccurring indications may be performed as noted. The reflux portion of the exam is performed with the patient in reverse Trendelenburg.  +---------+---------------+---------+-----------+----------+--------------+ RIGHT    CompressibilityPhasicitySpontaneityPropertiesThrombus Aging +---------+---------------+---------+-----------+----------+--------------+ CFV      Full           No       Yes                                 +---------+---------------+---------+-----------+----------+--------------+ SFJ      Full           No       Yes                                 +---------+---------------+---------+-----------+----------+--------------+ FV Prox  Full                                                        +---------+---------------+---------+-----------+----------+--------------+ FV Mid   Full                                                        +---------+---------------+---------+-----------+----------+--------------+  FV DistalFull                                                        +---------+---------------+---------+-----------+----------+--------------+ PFV      Full                                                        +---------+---------------+---------+-----------+----------+--------------+ POP      Full            No       Yes                                 +---------+---------------+---------+-----------+----------+--------------+ PTV      Full                                                        +---------+---------------+---------+-----------+----------+--------------+ PERO     Full                                                        +---------+---------------+---------+-----------+----------+--------------+   +---------+---------------+---------+-----------+----------+--------------+ LEFT     CompressibilityPhasicitySpontaneityPropertiesThrombus Aging +---------+---------------+---------+-----------+----------+--------------+ CFV      Full           No       Yes                                 +---------+---------------+---------+-----------+----------+--------------+ SFJ      Full           No       Yes                                 +---------+---------------+---------+-----------+----------+--------------+ FV Prox  Full                                                        +---------+---------------+---------+-----------+----------+--------------+ FV Mid   Full                                                        +---------+---------------+---------+-----------+----------+--------------+ FV DistalFull                                                        +---------+---------------+---------+-----------+----------+--------------+  PFV      Full                                                        +---------+---------------+---------+-----------+----------+--------------+ POP      Full           No       Yes                                 +---------+---------------+---------+-----------+----------+--------------+ PTV      Full                                                        +---------+---------------+---------+-----------+----------+--------------+ PERO     Full                                                         +---------+---------------+---------+-----------+----------+--------------+ SSV      Full           No       Yes                  Chronic        +---------+---------------+---------+-----------+----------+--------------+ Chronic calcified thrombus noted in the left small saphenous vein.    Summary: RIGHT: - There is no evidence of deep vein thrombosis in the lower extremity.  - No cystic structure found in the popliteal fossa.  LEFT: - Findings consistent with chronic superficial vein thrombosis involving the left small saphenous vein.  - No cystic structure found in the popliteal fossa.  *See table(s) above for measurements and observations. Electronically signed by Gaile New MD on 09/29/2024 at 12:45:02 PM.    Final    VAS US  UPPER EXT VEIN MAPPING (PRE-OP  AVF) Result Date: 09/29/2024 UPPER EXTREMITY VEIN MAPPING Patient Name:  JEILANI GRUPE  Date of Exam:   09/29/2024 Medical Rec #: [id number]        Accession #:    [id number] Date of Birth: [birth year]        Patient Gender: F Patient Age:   56 years Exam Location:  [facility] Procedure:      VAS US  UPPER EXT VEIN MAPPING (PRE-OP  AVF) Referring Phys: GAILE NEW --------------------------------------------------------------------------------  Indications: Pre-access. History: ESRD, volume overload.  Limitations: Limited exam due to patient's uncooperativeness, refused to              continue with exam. Comparison Study: No prior exam. Performing Technologist: Edilia Elden Appl  Examination Guidelines: A complete evaluation includes B-mode imaging, spectral Doppler, color Doppler, and power Doppler as needed of all accessible portions of each vessel. Bilateral testing is considered an integral part of a complete examination. Limited examinations for reoccurring indications may be performed as noted. +-----------------+-------------+----------+--------------+ Right Cephalic   Diameter (cm)Depth (cm)   Findings     +-----------------+-------------+----------+--------------+ Shoulder             0.17  0.80                  +-----------------+-------------+----------+--------------+ Prox upper arm                          not visualized +-----------------+-------------+----------+--------------+ Mid upper arm                           not visualized +-----------------+-------------+----------+--------------+ Dist upper arm                          not visualized +-----------------+-------------+----------+--------------+ Antecubital fossa    0.45        0.55      Thrombus    +-----------------+-------------+----------+--------------+ Prox forearm         0.49        0.43      Thrombus    +-----------------+-------------+----------+--------------+ Mid forearm          0.51        0.36                  +-----------------+-------------+----------+--------------+ Dist forearm         0.19        0.11                  +-----------------+-------------+----------+--------------+ Wrist                0.15        0.13                  +-----------------+-------------+----------+--------------+ +-----------------+-------------+----------+---------+ Right Basilic    Diameter (cm)Depth (cm)Findings  +-----------------+-------------+----------+---------+ Mid upper arm        0.53               branching +-----------------+-------------+----------+---------+ Dist upper arm       0.47               branching +-----------------+-------------+----------+---------+ Antecubital fossa    0.35                         +-----------------+-------------+----------+---------+ Prox forearm         0.17                         +-----------------+-------------+----------+---------+ Mid forearm          0.17                         +-----------------+-------------+----------+---------+ Distal forearm       0.19                          +-----------------+-------------+----------+---------+ Elbow                0.29                         +-----------------+-------------+----------+---------+ Wrist                0.16                         +-----------------+-------------+----------+---------+ +-----------------+-------------+----------+--------------+ Left Cephalic    Diameter (cm)Depth (cm)   Findings    +-----------------+-------------+----------+--------------+ Shoulder  0.21        2.00                  +-----------------+-------------+----------+--------------+ Prox upper arm       0.12        0.73                  +-----------------+-------------+----------+--------------+ Mid upper arm                           not visualized +-----------------+-------------+----------+--------------+ Dist upper arm                          not visualized +-----------------+-------------+----------+--------------+ Antecubital fossa    0.69        0.35                  +-----------------+-------------+----------+--------------+ Prox forearm         0.48        0.16                  +-----------------+-------------+----------+--------------+ Mid forearm          0.31        0.20                  +-----------------+-------------+----------+--------------+ Dist forearm         0.27        0.14                  +-----------------+-------------+----------+--------------+ Wrist                0.16        0.16                  +-----------------+-------------+----------+--------------+ +--------------+-------------+----------+--------+ Left Basilic  Diameter (cm)Depth (cm)Findings +--------------+-------------+----------+--------+ Mid upper arm     0.80                        +--------------+-------------+----------+--------+ Dist upper arm    0.46                        +--------------+-------------+----------+--------+ Patient refused to continue the exam, left upper  extremity is limited. *See table(s) above for measurements and observations.  Diagnosing physician: Gaile New MD Electronically signed by Gaile New MD on 09/29/2024 at 12:44:30 PM.    Final    CT Angio Chest Pulmonary Embolism (PE) W or WO Contrast Result Date: 09/28/2024 EXAM: CTA CHEST 09/28/2024 06:48:36 PM TECHNIQUE: CTA of the chest was performed after the administration of intravenous contrast. Multiplanar reformatted images are provided for review. MIP images are provided for review. Automated exposure control, iterative reconstruction, and/or weight based adjustment of the mA/kV was utilized to reduce the radiation dose to as low as reasonably achievable. COMPARISON: Chest CT 07/09/2024. CLINICAL HISTORY: Pulmonary embolism (PE) suspected, low to intermediate prob, positive D-dimer. FINDINGS: PULMONARY ARTERIES: Pulmonary arteries are adequately opacified for evaluation. No acute pulmonary embolus. Main pulmonary artery is normal in caliber. MEDIASTINUM: The heart is enlarged. Coronary atherosclerotic calcifications are noted. There is a stable small pericardial effusion. Aortic atherosclerotic calcifications are noted. There is a 6 mm hypodense isthmus nodule. No aortic dissection, intramural hematoma, aortic ulceration, or rupture. LYMPH NODES: No mediastinal, hilar or axillary lymphadenopathy. LUNGS AND PLEURA: Mild emphysema present. Airspace consolidation in the left lower lobe inferiorly and also minimally within the  lingula. Additional tree in bud opacities in the inferior left upper lobe. Secretions in the left lower lobe bronchus. No evidence of pleural effusion or pneumothorax. UPPER ABDOMEN: Cysts in the kidneys. SOFT TISSUES AND BONES: Healed right sided 7th and 8th rib fractures. No acute soft tissue abnormality. IMPRESSION: 1. No evidence of pulmonary embolism. 2. Airspace consolidation in the left lower lobe and inferior left upper lobe compatible with multifocal pneumonia. There are  secretions in the left lower lobe bronchus concerning for aspiration. 3. Mild emphysema; pulmonary emphysema is an independent risk factor for lung cancer, recommend consideration for evaluation for a low-dose CT lung cancer screening program. Electronically signed by: Greig Pique MD 09/28/2024 07:03 PM EST RP Workstation: [id number]   DG Chest Port 1 View Result Date: 09/28/2024 CLINICAL DATA:  Acute respiratory distress. EXAM: PORTABLE CHEST 1 VIEW COMPARISON:  September 25, 2024 FINDINGS: Since the prior study there is been interval placement of a right-sided venous catheter with its distal tip noted at the junction of the superior vena cava and right atrium. The cardiac silhouette is enlarged and unchanged in size. There is prominence of the central pulmonary vasculature. Mild diffusely increased interstitial lung markings are also noted. Mild to moderate severity atelectasis and/or infiltrate is suspected within the retrocardiac region of the left lung base. A small left pleural effusion is also noted. No pneumothorax is identified. Chronic right rib fractures are present. IMPRESSION: 1. Cardiomegaly with mild pulmonary vascular congestion and mild interstitial edema. 2. Mild to moderate severity left basilar atelectasis and/or infiltrate. 3. Small left pleural effusion. Electronically Signed   By: Suzen Dials M.D.   On: 09/28/2024 14:19   IR NON-TUNNELED CENTRAL VENOUS CATH [facility] W IMG Result Date: 09/27/2024 INDICATION: 69 year old female with history of renal failure requiring central venous access for initiation of hemodialysis. EXAM: NON-TUNNELED CENTRAL VENOUS HEMODIALYSIS CATHETER PLACEMENT WITH ULTRASOUND AND FLUOROSCOPIC GUIDANCE COMPARISON:  None Available. MEDICATIONS: None FLUOROSCOPY TIME:  Five mGy reference air kerma COMPLICATIONS: None immediate. PROCEDURE: Informed written consent was obtained from the patient after a discussion of the risks, benefits, and alternatives to treatment.  Questions regarding the procedure were encouraged and answered. The right neck and chest were prepped with chlorhexidine  in a sterile fashion, and a sterile drape was applied covering the operative field. Maximum barrier sterile technique with sterile gowns and gloves were used for the procedure. A timeout was performed prior to the initiation of the procedure. After the overlying soft tissues were anesthetized, a small venotomy incision was created and a micropuncture kit was utilized to access the internal jugular vein. Real-time ultrasound guidance was utilized for vascular access including the acquisition of a permanent ultrasound image documenting patency of the accessed vessel. The microwire was utilized to measure appropriate catheter length. A guidewire was advanced to the level of the IVC. Under fluoroscopic guidance, the venotomy was serially dilated, ultimately allowing placement of a 16 cm temporary Trialysis catheter with tip ultimately terminating within the superior aspect of the right atrium. Final catheter positioning was confirmed and documented with a spot radiographic image. The catheter aspirates and flushes normally. The catheter was flushed with appropriate volume heparin  dwells. The catheter exit site was secured with a 0 silk retention suture. A dressing was placed. The patient tolerated the procedure well without immediate post procedural complication. IMPRESSION: Successful placement of a right internal jugular approach 16 cm temporary dialysis catheter with tip terminating with in the superior aspect of the right atrium. The catheter is ready for  immediate use. PLAN: This catheter may be converted to a tunneled dialysis catheter at a later date as indicated. Ester Sides, MD [facility] Electronically Signed   By: Ester Sides M.D.   On: 09/27/2024 14:01   ECHOCARDIOGRAM COMPLETE Result Date: 09/27/2024    ECHOCARDIOGRAM  REPORT   Patient Name:   Daniesha Driver Date of Exam: 09/27/2024 Medical Rec #:  [id number]       Height:       67.0 in Accession #:    [id number]      Weight:       180.8 lb Date of Birth:  1955/11/03       BSA:          1.937 m Patient Age:    68 years        BP:           146/66 mmHg Patient Gender: F               HR:           65 bpm. Exam Location:  Inpatient Procedure: 2D Echo, Cardiac Doppler, Color Doppler and Strain Analysis (Both            Spectral and Color Flow Doppler were utilized during procedure). Indications:    CHF- Acute Systolic I50.21  History:        Patient has prior history of Echocardiogram examinations, most                 recent 12/11/2023. CHF, COPD and Stroke; Risk Factors:Diabetes,                 CKD, Dyslipidemia and Hypertension.  Sonographer:    Koleen Popper RDCS Referring Phys: TWYLA NEARING  Sonographer Comments: Image acquisition challenging due to respiratory motion. IMPRESSIONS  1. Left ventricular ejection fraction, by estimation, is 70 to 75%. The left ventricle has hyperdynamic function. The left ventricle has no regional wall motion abnormalities. There is severe asymmetric left ventricular hypertrophy of the septal segment. Turbulent flow/flow accerelation in the LVOT likjely from a combination of severe LVH and hyperdynamic LV function. Left ventricular diastolic parameters are consistent with Grade I diastolic dysfunction (impaired relaxation). Elevated left ventricular end-diastolic pressure. The average left ventricular global longitudinal strain is -17.6 %. The global longitudinal strain is normal.  2. Right ventricular systolic function is normal. The right ventricular size is moderately enlarged. There is mildly elevated pulmonary artery systolic pressure. The estimated right ventricular systolic pressure is 36.9 mmHg.  3. Left atrial size was mildly dilated.  4. Right atrial size was mild to moderately dilated.  5. The mitral valve is grossly normal. No evidence of  mitral valve regurgitation. No evidence of mitral stenosis.  6. The aortic valve was not well visualized. Aortic valve regurgitation is not visualized. No aortic stenosis is present.  7. The inferior vena cava is dilated in size with >50% respiratory variability, suggesting right atrial pressure of 8 mmHg.  8. Increased flow velocities may be secondary to anemia, thyrotoxicosis, hyperdynamic or high flow state.  9. Small to moderate pericardial effusion is located posterior and lateral to the left ventricle. FINDINGS  Left Ventricle: Left ventricular ejection fraction, by estimation, is 70 to 75%. The left ventricle has hyperdynamic function. The left ventricle has no regional wall motion abnormalities. The average left ventricular global longitudinal strain is -17.6  %. Strain was performed and the global longitudinal strain is normal. The left ventricular  internal cavity size was normal in size. There is severe asymmetric left ventricular hypertrophy of the septal segment. Left ventricular diastolic parameters are consistent with Grade I diastolic dysfunction (impaired relaxation). Elevated left ventricular end-diastolic pressure. Right Ventricle: The right ventricular size is moderately enlarged. No increase in right ventricular wall thickness. Right ventricular systolic function is normal. There is mildly elevated pulmonary artery systolic pressure. The tricuspid regurgitant velocity is 2.69 m/s, and with an assumed right atrial pressure of 8 mmHg, the estimated right ventricular systolic pressure is 36.9 mmHg. Left Atrium: Left atrial size was mildly dilated. Right Atrium: Right atrial size was mild to moderately dilated. Pericardium: Small to moderate. The pericardial effusion is posterior and lateral to the left ventricle. Mitral Valve: The mitral valve is grossly normal. Mild to moderate mitral annular calcification. No evidence of mitral valve regurgitation. No evidence of mitral valve stenosis. Tricuspid  Valve: The tricuspid valve is grossly normal. Tricuspid valve regurgitation is trivial. No evidence of tricuspid stenosis. Aortic Valve: The aortic valve was not well visualized. Aortic valve regurgitation is not visualized. No aortic stenosis is present. Pulmonic Valve: The pulmonic valve was not well visualized. Pulmonic valve regurgitation is not visualized. No evidence of pulmonic stenosis. Aorta: The aortic root is normal in size and structure. Venous: The inferior vena cava is dilated in size with greater than 50% respiratory variability, suggesting right atrial pressure of 8 mmHg. IAS/Shunts: No atrial level shunt detected by color flow Doppler. Additional Comments: 3D was performed not requiring image post processing on an independent workstation and was indeterminate.  LEFT VENTRICLE PLAX 2D LVIDd:         5.10 cm      Diastology LVIDs:         2.90 cm      LV e' medial:    5.77 cm/s LV PW:         1.40 cm      LV E/e' medial:  19.9 LV IVS:        1.70 cm      LV e' lateral:   6.53 cm/s LVOT diam:     1.80 cm      LV E/e' lateral: 17.6 LV SV:         92 LV SV Index:   48           2D Longitudinal Strain LVOT Area:     2.54 cm     2D Strain GLS Avg:     -17.6 %  LV Volumes (MOD) LV vol d, MOD A4C: 223.0 ml LV vol s, MOD A4C: 75.9 ml LV SV MOD A4C:     223.0 ml RIGHT VENTRICLE             IVC RV Basal diam:  4.55 cm     IVC diam: 2.90 cm RV Mid diam:    3.70 cm RV S prime:     18.00 cm/s TAPSE (M-mode): 2.6 cm LEFT ATRIUM             Index        RIGHT ATRIUM           Index LA diam:        5.60 cm 2.89 cm/m   RA Area:     19.90 cm LA Vol (A2C):   75.8 ml 39.13 ml/m  RA Volume:   60.80 ml  31.38 ml/m LA Vol (A4C):   72.6 ml 37.48 ml/m LA Biplane Vol: 76.0 ml 39.23 ml/m  AORTIC VALVE LVOT Vmax:   167.00 cm/s LVOT Vmean:  113.000 cm/s LVOT VTI:    0.362 m  AORTA Ao Root diam: 2.80 cm Ao Asc diam:  3.20 cm MITRAL VALVE                TRICUSPID VALVE MV Area (PHT): 3.99 cm     TR Peak grad:   28.9 mmHg MV  Decel Time: 190 msec     TR Vmax:        269.00 cm/s MR Peak grad: 102.0 mmHg MR Vmax:      505.00 cm/s   SHUNTS MV E velocity: 115.00 cm/s  Systemic VTI:  0.36 m MV A velocity: 121.00 cm/s  Systemic Diam: 1.80 cm MV E/A ratio:  0.95 Vishnu Priya Mallipeddi Electronically signed by Diannah Late Mallipeddi Signature Date/Time: 09/27/2024/11:33:24 AM    Final    DG Chest Portable 1 View Result Date: 09/25/2024 EXAM: 1 VIEW(S) XRAY OF THE CHEST 09/25/2024 09:47:00 PM COMPARISON: 08/17/2024 CLINICAL HISTORY: SOB FINDINGS: LUNGS AND PLEURA: Mild pulmonary edema. No pleural effusion. No pneumothorax. HEART AND MEDIASTINUM: Cardiomegaly. Aortic atherosclerosis. BONES AND SOFT TISSUES: Remote right rib fractures. IMPRESSION: 1. Mild pulmonary edema. 2. Cardiomegaly and aortic atherosclerosis. 3. Remote right rib fractures. Electronically signed by: Greig Pique MD 09/25/2024 09:57 PM EST RP Workstation: [id number]   I personally spent a total of 50 minutes in the care of the patient today including preparing to see the patient, getting/reviewing separately obtained history, performing a medically appropriate exam/evaluation, counseling and educating, placing orders, documenting clinical information in the EHR, independently interpreting results, communicating results, and coordinating care.   Electronically signed by:   Annalee Orem, MD Infectious Disease Physician [facility] Pager: [phone]  "

## 2024-10-06 NOTE — Evaluation (Signed)
 Modified Barium Swallow Study  Patient Details  Name: Catherine Aguilar MRN: [id number] Date of Birth: 27-May-1956  Today's Date: 10/06/2024  Modified Barium Swallow completed.  Full report located under Chart Review in the Imaging Section.  History of Present Illness Catherine Aguilar is a 69 y.o. F admitted from home for CHF exacerbation. Rapid response called due to increased WOB, placed on Bipap. MD note states rapid response RN suspected stridor are more consistent with vocal cord dysfunction. S/p laryngoscopy with ENT 1/21 that was notable for L vocal fold paralysis in the paramedian position as well as slight edema in the vocal folds/arytenoids and increased thickness of the L vocal fold.  CT chest showed airspace consolidation in the LLL and inferior LUL compatible with multifocal PNA as well as secretions in the LLL bronchus concerning for aspiration, mild emphysema. BSE in 2018 normal oropharyngeal function, reg/thin rec'd.  PMHx of T2DM, HTN, CKD stage V, anemia of chronic disease, COPD, Wegener's granulomatosis with renal involvement who is admitted for CHF exacerbation.   Clinical Impression Pt has a functional oropharyngeal swallow with no residue or aspiration observed. She has trace, transient penetration of thin liquids that clears upon completion of the swallow (PAS 2, considered to be normal). Pt still endorses the sensation of solids sometimes getting stuck, which is suspected to be more of an esophageal etiology. No further acute SLP f/u indicated. Would resume regular solids and thin liquids at MD discretion pending esophagram results.  Factors that may increase risk of adverse event in presence of aspiration Noe & Lianne 2021): Respiratory or GI disease  Swallow Evaluation Recommendations Recommendations: PO diet PO Diet Recommendation: Regular;Thin liquids (Level 0) Liquid Administration via: Cup;Straw Medication Administration: Whole meds with liquid Supervision:  Patient able to self-feed Swallowing strategies  : Slow rate;Small bites/sips;Follow solids with liquids Postural changes: Position pt fully upright for meals;Stay upright 30-60 min after meals Oral care recommendations: Oral care BID (2x/day) Recommended consults: Other(comment) (could consider GI as needed pending results of esophagram)      Leita SAILOR., M.A. CCC-SLP Acute Rehabilitation Services Office: [phone]  Secure chat preferred  10/06/2024,2:03 PM

## 2024-10-06 NOTE — Assessment & Plan Note (Signed)
 DMII: BCG ACHS, Continue SSI HLD: continue home Pravastatin  40 mg daily Chronic SVT of L small saphenous vein - Not required AC per vascular

## 2024-10-06 NOTE — Progress Notes (Signed)
 [facility] NEPHROLOGY PROGRESS NOTE  Assessment/ Plan: Pt is a 69 y.o. yo female   # End-stage renal disease: From progressive underlying chronic kidney disease secondary to hypertension, type 2 diabetes mellitus and distant history of ANCA vasculitis.  Started on hemodialysis on 09/26/2024 via temporary right dialysis catheter dialysis catheter and had her third dialysis treatment on 1/20.  Her temporary dialysis catheter was removed on 1/21 and she remains on a catheter holiday.  - -Status post right IJ [facility] and a left radiocephalic AV fistula creation by Dr. Gretta on 1/26. -Pt has been accepted at [facility] on TTS 11:30 am chair time. Pt will need to arrive at 10:45 am for first appt to complete paperwork prior to treatment.  -HD today, tolerating well.  Continue TTS schedule.  # Hypertension: Blood pressure and volume status acceptable, UF with HD.  Monitor BP.  # Anemia: Continue ESA, monitor hemoglobin.  No overt blood loss noted.  # Secondary hyperparathyroidism/hyperphosphatemia: Calcium  level acceptable, continue binders for phosphorus.  Renal diet.   # MRSA bacteremia secondary to temporary HD line which was removed, currently on vancomycin  per ID.  Plan for TEE.  Subjective: Seen and examined.  Tolerating dialysis well.  Denies nausea, vomiting, chest pain or shortness of breath. Objective Vital signs in last 24 hours: Vitals:   10/06/24 0846 10/06/24 0856 10/06/24 0935 10/06/24 1009  BP: 128/68 (!) 148/67 127/68 (!) 145/72  Pulse: 85 82 82 89  Resp: 18 20 16  (!) 22  Temp: 97.9 F (36.6 C)     TempSrc:      SpO2: 93% 94% 97% 92%  Weight: (S) 80.2 kg     Height:       Weight change:   Intake/Output Summary (Last 24 hours) at 10/06/2024 1019 Last data filed at 10/05/2024 2000 Gross per 24 hour  Intake 860 ml  Output --  Net 860 ml       Labs: RENAL PANEL Recent Labs  Lab 10/02/24 0422 10/03/24 0315 10/04/24 0315 10/05/24 0516 10/06/24 0330   NA 129* 131* 130* 133* 132*  K 3.7 3.6 3.8 4.4 4.8  CL 88* 90* 89* 90* 90*  CO2 22 17* 19* 17* 18*  GLUCOSE 96 95 89 70 135*  BUN 82* 91* 96* 106* 111*  CREATININE 9.04* 10.20* 10.90* 12.00* 12.60*  CALCIUM  8.7* 8.5* 8.7* 9.1 9.2  PHOS 7.2* 7.2* 7.7* 9.2* 8.7*  ALBUMIN 3.2* 3.1* 3.1* 3.2* 3.3*    Liver Function Tests: Recent Labs  Lab 10/04/24 0315 10/05/24 0516 10/06/24 0330  ALBUMIN 3.1* 3.2* 3.3*   No results for input(s): LIPASE, AMYLASE in the last 168 hours. No results for input(s): AMMONIA in the last 168 hours. CBC: Recent Labs    01/15/24 1319 01/15/24 1320 05/27/24 1434 06/10/24 1159 06/24/24 1150 06/24/24 1154 07/22/24 1229 07/22/24 1235 09/23/24 1243 09/23/24 1312 09/26/24 0343 09/27/24 0401 10/02/24 2159 10/03/24 0315 10/04/24 0315 10/05/24 0122 10/06/24 0330  HGB  --    < >  --    < >  --    < >  --    < >  --    < > 7.3*   < > 7.7* 7.4* 7.3* 7.8* 7.1*  MCV  --    < >  --   --   --    < >  --    < >  --    < > 81.7   < > 80.1 80.6 79.8* 79.0* 81.2  VITAMINB12 505  --   --   --   --   --   --   --   --   --   --   --   --   --   --   --   --   FOLATE 5.9*  --   --   --   --   --   --   --   --   --   --   --   --   --   --   --   --   FERRITIN  --    < > 273  --  253  --  339*  --  737*  --  694*  --   --   --   --   --   --   TIBC  --    < > 259  --  281  --  277  --  231*  --  216*  --   --   --   --   --   --   IRON   --    < > 103  --  57  --  61  --  98  --  56  --   --   --   --   --   --   RETICCTPCT 1.9  --   --   --   --   --   --   --   --   --  2.4  --   --   --   --   --   --    < > = values in this interval not displayed.    Cardiac Enzymes: No results for input(s): CKTOTAL, CKMB, CKMBINDEX, TROPONINI in the last 168 hours. CBG: Recent Labs  Lab 10/05/24 1307 10/05/24 1604 10/05/24 2127 10/06/24 0649 10/06/24 0738  GLUCAP 114* 206* 294* 140* 154*    Iron  Studies: No results for input(s): IRON , TIBC,  TRANSFERRIN, FERRITIN in the last 72 hours. Studies/Results: DG CHEST PORT 1 VIEW Result Date: 10/05/2024 CLINICAL DATA:  Status post dialysis catheter placement EXAM: PORTABLE CHEST 1 VIEW COMPARISON:  Chest radiograph dated 09/28/2024 FINDINGS: Lines/tubes: Right internal jugular venous catheter tip projects over the right atrium. Lungs: Mildly low lung volumes. Bilateral interstitial opacities, left-greater-than-right. Increased patchy and confluent left lung opacities. Pleura: Moderate to large left pleural effusion.  No pneumothorax. Heart/mediastinum: Similar enlarged cardiomediastinal silhouette. Aortic atherosclerosis. Bones: No acute osseous abnormality. IMPRESSION: 1. Right internal jugular venous catheter tip projects over the right atrium. No pneumothorax. 2. Moderate to large left pleural effusion. 3. Bilateral interstitial opacities, left-greater-than-right, likely pulmonary edema. 4. Confluent and patchy left lung opacities may represent superimposed atelectasis or pneumonia. Electronically Signed   By: Limin  Xu M.D.   On: 10/05/2024 14:00   DG C-Arm 1-60 Min Result Date: 10/05/2024 CLINICAL DATA:  69 year old female undergoing placement of tunneled hemodialysis catheter. EXAM: DG C-ARM 1-60 MIN CONTRAST:  Not applicable. FLUOROSCOPY: 6.31 mGy reference air kerma COMPARISON:  09/26/2024 FINDINGS: Single fluoroscopic image demonstrates placement of a tunneled hemodialysis catheter with the catheter tip in the right atrium. IMPRESSION: Single fluoroscopic image demonstrates placement of tunneled hemodialysis catheter. Please refer to the operative report for further details. Electronically Signed   By: Ester Sides M.D.   On: 10/05/2024 13:44    Medications: Infusions:  vancomycin       Scheduled Medications:  sodium chloride    Intravenous Once  sodium chloride    Intravenous Once   amLODipine   10 mg Oral Daily   budesonide -glycopyrrolate -formoterol   2 puff Inhalation Daily    calcium  acetate  667 mg Oral TID WC   carvedilol   12.5 mg Oral BID WC   Chlorhexidine  Gluconate Cloth  6 each Topical Daily   darbepoetin (ARANESP ) injection - DIALYSIS  60 mcg Subcutaneous Q Mon-1800   doxazosin   2 mg Oral Daily   heparin   5,000 Units Subcutaneous Q8H   hydrALAZINE   100 mg Oral Q8H   insulin  aspart  0-15 Units Subcutaneous TID WC   ipratropium-albuterol   3 mL Nebulization BID   pravastatin   40 mg Oral QHS   vancomycin  variable dose per unstable renal function (pharmacist dosing)   Does not apply See admin instructions    have reviewed scheduled and prn medications.  Physical Exam: General:NAD, comfortable Heart:RRR, s1s2 nl Lungs:clear b/l, no crackle Abdomen:soft, Non-tender, non-distended Extremities:No edema Dialysis Access: TDC site clean, AV fistula wound clean.  Erlean Mealor Prasad Kamori Barbier 10/06/2024,10:19 AM  LOS: 10 days

## 2024-10-06 NOTE — Assessment & Plan Note (Addendum)
 AM Weight 80.4kg ( 83 kg on admission) - Continue cardiac monitoring  - Cardiology consulted, Appreciated recommendation  - Plan for TEE after iHD placement and Dysphagia evaluation; barium swallow/esophogram prior to TEE 01/27  - Continue home Coreg  12.5 mg BID, Hydralazine  100 mg TID, Amlodipine  10 mg Daily, and Doxazosin  2 mg Daily

## 2024-10-06 NOTE — Progress Notes (Signed)
" °   10/06/24 1217  Vitals  Temp 97.9 F (36.6 C)  Pulse Rate 87  Resp (!) 22  BP (!) 149/68  SpO2 96 %  O2 Device Room Air  Weight (S)  78.4 kg (Bed Scale)  Type of Weight Post-Dialysis  Oxygen Therapy  Patient Activity (if Appropriate) In bed  Pulse Oximetry Type Continuous  Post Treatment  Dialyzer Clearance Clear  Hemodialysis Intake (mL) 0 mL  Liters Processed 54  Fluid Removed (mL) 1500 mL  Post-Hemodialysis Comments Tx. completed and patient tolerated HD tx without difficulties. UF goal was obtained, and Admin medication per. order. Report 4E via message   Received patient in bed to unit.  Alert and oriented.  Informed consent signed and in chart.   TX duration:3  Patient tolerated well.  Transported back to the room  Alert, without acute distress.  Hand-off given to patient's nurse.   Access used: Yes Access issues: No  Total UF removed: 1500 Medication(s) given: See MAR Post HD VS: See Above Grid Post HD weight: 78.4 kg   [name] "

## 2024-10-06 NOTE — Assessment & Plan Note (Addendum)
 On RA, denies SOB. Breathing overall much improved since admission.  - Continue home Breztri  inhaler 2 puffs daily  - Continue DuoNebs BID scheduled and q2h PRN - Benzonatate  200 mg TID PRN for cough

## 2024-10-07 ENCOUNTER — Inpatient Hospital Stay (HOSPITAL_COMMUNITY)

## 2024-10-07 ENCOUNTER — Inpatient Hospital Stay (HOSPITAL_COMMUNITY): Admitting: Anesthesiology

## 2024-10-07 ENCOUNTER — Encounter (HOSPITAL_COMMUNITY): Admission: EM | Disposition: A | Payer: Self-pay | Source: Home / Self Care | Attending: Family Medicine

## 2024-10-07 ENCOUNTER — Encounter (HOSPITAL_COMMUNITY): Payer: Self-pay | Admitting: Family Medicine

## 2024-10-07 ENCOUNTER — Inpatient Hospital Stay (HOSPITAL_COMMUNITY)
Admission: RE | Admit: 2024-10-07 | Discharge: 2024-10-07 | Disposition: A | Discharge status: Home / Self Care | Source: Ambulatory Visit | Attending: Internal Medicine | Admitting: Internal Medicine

## 2024-10-07 DIAGNOSIS — R1310 Dysphagia, unspecified: Secondary | ICD-10-CM

## 2024-10-07 DIAGNOSIS — R7881 Bacteremia: Secondary | ICD-10-CM | POA: Diagnosis not present

## 2024-10-07 DIAGNOSIS — B9562 Methicillin resistant Staphylococcus aureus infection as the cause of diseases classified elsewhere: Secondary | ICD-10-CM | POA: Diagnosis not present

## 2024-10-07 DIAGNOSIS — I2510 Atherosclerotic heart disease of native coronary artery without angina pectoris: Secondary | ICD-10-CM

## 2024-10-07 DIAGNOSIS — K2950 Unspecified chronic gastritis without bleeding: Secondary | ICD-10-CM

## 2024-10-07 DIAGNOSIS — K222 Esophageal obstruction: Secondary | ICD-10-CM | POA: Diagnosis not present

## 2024-10-07 DIAGNOSIS — K2289 Other specified disease of esophagus: Secondary | ICD-10-CM

## 2024-10-07 DIAGNOSIS — D72829 Elevated white blood cell count, unspecified: Secondary | ICD-10-CM | POA: Diagnosis not present

## 2024-10-07 DIAGNOSIS — I509 Heart failure, unspecified: Secondary | ICD-10-CM

## 2024-10-07 DIAGNOSIS — J3801 Paralysis of vocal cords and larynx, unilateral: Secondary | ICD-10-CM | POA: Diagnosis not present

## 2024-10-07 DIAGNOSIS — I330 Acute and subacute infective endocarditis: Secondary | ICD-10-CM | POA: Diagnosis not present

## 2024-10-07 DIAGNOSIS — K449 Diaphragmatic hernia without obstruction or gangrene: Secondary | ICD-10-CM

## 2024-10-07 DIAGNOSIS — Q399 Congenital malformation of esophagus, unspecified: Secondary | ICD-10-CM

## 2024-10-07 DIAGNOSIS — K31A0 Gastric intestinal metaplasia, unspecified: Secondary | ICD-10-CM | POA: Diagnosis not present

## 2024-10-07 DIAGNOSIS — R933 Abnormal findings on diagnostic imaging of other parts of digestive tract: Secondary | ICD-10-CM | POA: Diagnosis not present

## 2024-10-07 DIAGNOSIS — K219 Gastro-esophageal reflux disease without esophagitis: Secondary | ICD-10-CM

## 2024-10-07 DIAGNOSIS — T80211D Bloodstream infection due to central venous catheter, subsequent encounter: Secondary | ICD-10-CM | POA: Diagnosis not present

## 2024-10-07 DIAGNOSIS — I110 Hypertensive heart disease with heart failure: Secondary | ICD-10-CM | POA: Diagnosis not present

## 2024-10-07 DIAGNOSIS — R1319 Other dysphagia: Secondary | ICD-10-CM

## 2024-10-07 DIAGNOSIS — K3189 Other diseases of stomach and duodenum: Secondary | ICD-10-CM

## 2024-10-07 LAB — CBC
HCT: 22.2 % — ABNORMAL LOW (ref 36.0–46.0)
Hemoglobin: 7.2 g/dL — ABNORMAL LOW (ref 12.0–15.0)
MCH: 26.5 pg (ref 26.0–34.0)
MCHC: 32.4 g/dL (ref 30.0–36.0)
MCV: 81.6 fL (ref 80.0–100.0)
Platelets: 349 10*3/uL (ref 150–400)
RBC: 2.72 MIL/uL — ABNORMAL LOW (ref 3.87–5.11)
RDW: 16.8 % — ABNORMAL HIGH (ref 11.5–15.5)
WBC: 12.5 10*3/uL — ABNORMAL HIGH (ref 4.0–10.5)
nRBC: 0 % (ref 0.0–0.2)

## 2024-10-07 LAB — RENAL FUNCTION PANEL
Albumin: 3.2 g/dL — ABNORMAL LOW (ref 3.5–5.0)
Anion gap: 14 (ref 5–15)
BUN: 55 mg/dL — ABNORMAL HIGH (ref 8–23)
CO2: 26 mmol/L (ref 22–32)
Calcium: 8.9 mg/dL (ref 8.9–10.3)
Chloride: 92 mmol/L — ABNORMAL LOW (ref 98–111)
Creatinine, Ser: 7.28 mg/dL — ABNORMAL HIGH (ref 0.44–1.00)
GFR, Estimated: 6 mL/min — ABNORMAL LOW
Glucose, Bld: 102 mg/dL — ABNORMAL HIGH (ref 70–99)
Phosphorus: 5.4 mg/dL — ABNORMAL HIGH (ref 2.5–4.6)
Potassium: 3.7 mmol/L (ref 3.5–5.1)
Sodium: 132 mmol/L — ABNORMAL LOW (ref 135–145)

## 2024-10-07 LAB — GLUCOSE, CAPILLARY
Glucose-Capillary: 104 mg/dL — ABNORMAL HIGH (ref 70–99)
Glucose-Capillary: 93 mg/dL (ref 70–99)
Glucose-Capillary: 94 mg/dL (ref 70–99)
Glucose-Capillary: 128 mg/dL — ABNORMAL HIGH (ref 70–99)

## 2024-10-07 LAB — CULTURE, BLOOD (ROUTINE X 2)
Culture: NO GROWTH
Culture: NO GROWTH
Special Requests: ADEQUATE
Special Requests: ADEQUATE

## 2024-10-07 LAB — ECHO TEE

## 2024-10-07 LAB — HEMOGLOBIN AND HEMATOCRIT, BLOOD
HCT: 26.5 % — ABNORMAL LOW (ref 36.0–46.0)
Hemoglobin: 8.7 g/dL — ABNORMAL LOW (ref 12.0–15.0)

## 2024-10-07 MED ORDER — HEPARIN SODIUM (PORCINE) 5000 UNIT/ML IJ SOLN
5000.0000 [IU] | Freq: Three times a day (TID) | INTRAMUSCULAR | Status: DC
  Administered 2024-10-08: 5000 [IU] via SUBCUTANEOUS
  Filled 2024-10-07 (×2): qty 1

## 2024-10-07 MED ORDER — PHENYLEPHRINE 80 MCG/ML (10ML) SYRINGE FOR IV PUSH (FOR BLOOD PRESSURE SUPPORT)
PREFILLED_SYRINGE | INTRAVENOUS | Status: DC | PRN
  Administered 2024-10-07: 120 ug via INTRAVENOUS

## 2024-10-07 MED ORDER — PANTOPRAZOLE SODIUM 40 MG PO TBEC
40.0000 mg | DELAYED_RELEASE_TABLET | Freq: Two times a day (BID) | ORAL | Status: DC
  Administered 2024-10-07 – 2024-10-08 (×2): 40 mg via ORAL
  Filled 2024-10-07 (×2): qty 1

## 2024-10-07 MED ORDER — SODIUM CHLORIDE 0.9% FLUSH: 3.0000 mL | INTRAVENOUS | Status: DC | PRN

## 2024-10-07 MED ORDER — SODIUM CHLORIDE 0.9% FLUSH: 3.0000 mL | Freq: Two times a day (BID) | INTRAVENOUS | Status: DC

## 2024-10-07 MED ORDER — PROPOFOL 10 MG/ML IV BOLUS
INTRAVENOUS | Status: DC | PRN
  Administered 2024-10-07 (×3): 30 mg via INTRAVENOUS

## 2024-10-07 MED ORDER — PROPOFOL 500 MG/50ML IV EMUL
INTRAVENOUS | Status: DC | PRN
  Administered 2024-10-07: 200 ug/kg/min via INTRAVENOUS

## 2024-10-07 MED ORDER — VANCOMYCIN IV (FOR PTA / DISCHARGE USE ONLY): 750.0000 mg | INTRAVENOUS | Status: AC

## 2024-10-07 MED ORDER — CHLORHEXIDINE GLUCONATE CLOTH 2 % EX PADS: 6.0000 | MEDICATED_PAD | Freq: Every day | CUTANEOUS | Status: DC

## 2024-10-07 MED ORDER — SODIUM CHLORIDE 0.9 % IV SOLN: 250.0000 mL | INTRAVENOUS | Status: DC | PRN

## 2024-10-07 MED ORDER — LIDOCAINE 2% (20 MG/ML) 5 ML SYRINGE
INTRAMUSCULAR | Status: DC | PRN
  Administered 2024-10-07: 60 mg via INTRAVENOUS

## 2024-10-07 NOTE — Assessment & Plan Note (Signed)
 Blood Cx neg to date. Stable WBC 12.5 from 15 yesterday  - Continue contact isolation (+ MRSA)  - ID consulted, appreciate recommendations - Continue vancomycin  (1/19 - ) - Completed PO Augmentin  250-125 mg q12hrs (1/20-1/24) for PNA - Recommend TEE given persistent leukocytosis  - Plan on TEE today - Daily CBC

## 2024-10-07 NOTE — Progress Notes (Signed)
 [facility] NEPHROLOGY PROGRESS NOTE  Assessment/ Plan: Pt is a 69 y.o. yo female   # End-stage renal disease: From progressive underlying chronic kidney disease secondary to hypertension, type 2 diabetes mellitus and distant history of ANCA vasculitis.  Started on hemodialysis on 09/26/2024 via temporary right dialysis catheter dialysis catheter and had her third dialysis treatment on 1/20.  Her temporary dialysis catheter was removed on 1/21 and she remains on a catheter holiday.  - -Status post right IJ [facility] and a left radiocephalic AV fistula creation by Dr. Gretta on 1/26. -Pt has been accepted at [facility] on TTS 11:30 am chair time. Pt will need to arrive at 10:45 am for first appt to complete paperwork prior to treatment.  - Continue TTS schedule. HD tomorrow.  # Hypertension: Blood pressure and volume status acceptable, UF with HD.  Monitor BP.  # Anemia: Continue ESA, monitor hemoglobin.  No overt blood loss noted.  # Secondary hyperparathyroidism/hyperphosphatemia: Calcium  level acceptable, continue binders for phosphorus.  Renal diet.   # MRSA bacteremia secondary to temporary HD line which was removed, currently on vancomycin  per ID.  Plan for TEE.  Subjective: Seen and examined.  She had dialysis yesterday with 1.5 L UF, tolerated well.  Getting GI consult for possible endoscopy.  Also plan for TEE.  No nausea, vomiting, chest pain or shortness of breath.  Objective Vital signs in last 24 hours: Vitals:   10/07/24 0802 10/07/24 0823 10/07/24 0856 10/07/24 1053  BP: (!) 131/56  118/61   Pulse: 89 85 82 71  Resp:   15   Temp: 98.4 F (36.9 C) 98.2 F (36.8 C) 98.4 F (36.9 C) 98.6 F (37 C)  TempSrc: Oral Oral Oral Oral  SpO2: 95% 90%  95%  Weight:      Height:       Weight change:   Intake/Output Summary (Last 24 hours) at 10/07/2024 1120 Last data filed at 10/07/2024 1053 Gross per 24 hour  Intake 345 ml  Output 1500 ml  Net -1155 ml        Labs: RENAL PANEL Recent Labs  Lab 10/03/24 0315 10/04/24 0315 10/05/24 0516 10/06/24 0330 10/07/24 0404  NA 131* 130* 133* 132* 132*  K 3.6 3.8 4.4 4.8 3.7  CL 90* 89* 90* 90* 92*  CO2 17* 19* 17* 18* 26  GLUCOSE 95 89 70 135* 102*  BUN 91* 96* 106* 111* 55*  CREATININE 10.20* 10.90* 12.00* 12.60* 7.28*  CALCIUM  8.5* 8.7* 9.1 9.2 8.9  PHOS 7.2* 7.7* 9.2* 8.7* 5.4*  ALBUMIN 3.1* 3.1* 3.2* 3.3* 3.2*    Liver Function Tests: Recent Labs  Lab 10/05/24 0516 10/06/24 0330 10/07/24 0404  ALBUMIN 3.2* 3.3* 3.2*   No results for input(s): LIPASE, AMYLASE in the last 168 hours. No results for input(s): AMMONIA in the last 168 hours. CBC: Recent Labs    01/15/24 1319 01/15/24 1320 05/27/24 1434 06/10/24 1159 06/24/24 1150 06/24/24 1154 07/22/24 1229 07/22/24 1235 09/23/24 1243 09/23/24 1312 09/26/24 0343 09/27/24 0401 10/03/24 0315 10/04/24 0315 10/05/24 0122 10/06/24 0330 10/07/24 0404  HGB  --    < >  --    < >  --    < >  --    < >  --    < > 7.3*   < > 7.4* 7.3* 7.8* 7.1* 7.2*  MCV  --    < >  --   --   --    < >  --    < >  --    < >  81.7   < > 80.6 79.8* 79.0* 81.2 81.6  VITAMINB12 505  --   --   --   --   --   --   --   --   --   --   --   --   --   --   --   --   FOLATE 5.9*  --   --   --   --   --   --   --   --   --   --   --   --   --   --   --   --   FERRITIN  --    < > 273  --  253  --  339*  --  737*  --  694*  --   --   --   --   --   --   TIBC  --    < > 259  --  281  --  277  --  231*  --  216*  --   --   --   --   --   --   IRON   --    < > 103  --  57  --  61  --  98  --  56  --   --   --   --   --   --   RETICCTPCT 1.9  --   --   --   --   --   --   --   --   --  2.4  --   --   --   --   --   --    < > = values in this interval not displayed.    Cardiac Enzymes: No results for input(s): CKTOTAL, CKMB, CKMBINDEX, TROPONINI in the last 168 hours. CBG: Recent Labs  Lab 10/06/24 0738 10/06/24 1454 10/06/24 2033  10/07/24 0603 10/07/24 1100  GLUCAP 154* 132* 105* 104* 93    Iron  Studies: No results for input(s): IRON , TIBC, TRANSFERRIN, FERRITIN in the last 72 hours. Studies/Results: CT SOFT TISSUE NECK W CONTRAST Result Date: 10/07/2024 CLINICAL DATA:  Initial evaluation for unilateral vocal cord paralysis, laterality not specified. EXAM: CT NECK WITH CONTRAST TECHNIQUE: Multidetector CT imaging of the neck was performed using the standard protocol following the bolus administration of intravenous contrast. RADIATION DOSE REDUCTION: This exam was performed according to the departmental dose-optimization program which includes automated exposure control, adjustment of the mA and/or kV according to patient size and/or use of iterative reconstruction technique. CONTRAST:  75mL OMNIPAQUE  IOHEXOL  350 MG/ML SOLN COMPARISON:  None Available. FINDINGS: Pharynx and larynx: Oral cavity within normal limits. Oropharynx and nasopharynx unremarkable without abnormality. No retropharyngeal collection or swelling. Negative epiglottis. Hypopharynx and supraglottic larynx within normal limits. Left true vocal cord appears medialized towards the midline, which could reflect paresis/palsy (series 2, image 77). Mild narrowing of the glottic airway which measures 4-5 mm in transverse diameter at its most narrow point. Subglottic airway clear. No structure abnormality seen along the course of either vagus nerve to explain vocal cord paralysis. Salivary glands: Salivary glands including the parotid and submandibular glands are within normal limits. Thyroid : 5 mm left thyroid  nodule noted, of doubtful significance given size and patient age, no follow-up imaging recommended (ref: J Am Coll Radiol. 2015 Feb;12(2): 143-50). Lymph nodes: No enlarged or pathologic lymph nodes within the neck. Vascular: Normal intravascular enhancement seen within the neck.  Moderate atheromatous change about the aortic arch, carotid bulbs, and carotid  siphons. Right IJ approach central venous catheter in place. Limited intracranial: No acute finding. Visualized orbits: Prior ocular lens replacement on the left. No other acute finding. Mastoids and visualized paranasal sinuses: Small volume pneumatized secretions noted within the left ethmoidal air cells. Paranasal sinuses are otherwise largely clear. Mastoid air cells and middle ear cavities are well pneumatized and free of fluid. Skeleton: No worrisome osseous lesions. Moderate spondylosis present at C5-6. Degenerative osteoarthritic changes noted about the right TMJ. Remote right-sided rib fracture noted. Upper chest: Patchy and dense nodular opacities within the visualized left upper lobe, again concerning for pneumonia. Associated small left pleural effusion. Cardiomegaly with coronary artery calcifications noted. Other: None. IMPRESSION: 1. Medialization of the left true vocal cord towards the midline, which could reflect paresis/palsy. No structural abnormality seen along the course of the vagus nerve to explain vocal cord paralysis. 2. Patchy and dense nodular opacities within the visualized left upper lobe, concerning for pneumonia. Associated small left pleural effusion. Radiographic follow-up to resolution following convalescence recommended to ensure these changes resolve. Aortic Atherosclerosis (ICD10-I70.0). Electronically Signed   By: Morene Hoard M.D.   On: 10/07/2024 01:12   DG ESOPHAGUS W SINGLE CM (SOL OR THIN BA) Result Date: 10/06/2024 CLINICAL DATA:  69 year old female. Endorsing dysphagia. Request is for single esophagram for further evaluation EXAM: ESOPHAGUS/BARIUM SWALLOW/TABLET STUDY TECHNIQUE: Single contrast examination was performed using thin liquid barium. This exam was performed by Delon Beagle NP and was supervised and interpreted by Dr. Juliene Balder. FLUOROSCOPY: Radiation Exposure Index (as provided by the fluoroscopic device): 18.6 mGy Kerma COMPARISON:  None  Available. FINDINGS: Swallowing: Appears normal. No vestibular penetration or aspiration seen. Pharynx: Unremarkable. Esophagus: Persistent narrowing at the GE junction. Dilatation of the distal esophageal vestibule. Esophageal motility: Within normal limits. Hiatal Hernia: Limited evaluation because the patient could not be placed prone or obtain a RAO projection. Gastroesophageal reflux: None visualized. Ingested 13mm barium tablet: Temporary stasis proximal to GE junction. Eventually passing with the aid of thin barium Other: None. IMPRESSION: 1. Narrowing at the gastroesophageal junction and the barium tablet was transiently stuck in this area. Findings could be associated with a stricture. 2. Limited evaluation for a hiatal hernia. Electronically Signed   By: Juliene Balder M.D.   On: 10/06/2024 16:13   DG Swallowing Func-Speech Pathology Result Date: 10/06/2024 Table formatting from the original result was not included. Modified Barium Swallow Study Patient Details Name: Tylie Golonka MRN: [id number] Date of Birth: 10-06-1955 Today's Date: 10/06/2024 HPI/PMH: HPI: Maudine Kluesner is a 69 y.o. F admitted from home for CHF exacerbation. Rapid response called due to increased WOB, placed on Bipap. MD note states rapid response RN suspected stridor are more consistent with vocal cord dysfunction. S/p laryngoscopy with ENT 1/21 that was notable for L vocal fold paralysis in the paramedian position as well as slight edema in the vocal folds/arytenoids and increased thickness of the L vocal fold.  CT chest showed airspace consolidation in the LLL and inferior LUL compatible with multifocal PNA as well as secretions in the LLL bronchus concerning for aspiration, mild emphysema. BSE in 2018 normal oropharyngeal function, reg/thin rec'd.  PMHx of T2DM, HTN, CKD stage V, anemia of chronic disease, COPD, Wegener's granulomatosis with renal involvement who is admitted for CHF exacerbation. Clinical Impression: Clinical  Impression: Pt has a functional oropharyngeal swallow with no residue or aspiration observed. She has trace, transient penetration of thin liquids that  clears upon completion of the swallow (PAS 2, considered to be normal). Pt still endorses the sensation of solids sometimes getting stuck, which is suspected to be more of an esophageal etiology. No further acute SLP f/u indicated. Would resume regular solids and thin liquids at MD discretion pending esophagram results. Factors that may increase risk of adverse event in presence of aspiration Noe & Lianne 2021): Factors that may increase risk of adverse event in presence of aspiration Noe & Lianne 2021): Respiratory or GI disease Recommendations/Plan: Swallowing Evaluation Recommendations Swallowing Evaluation Recommendations Recommendations: PO diet PO Diet Recommendation: Regular; Thin liquids (Level 0) Liquid Administration via: Cup; Straw Medication Administration: Whole meds with liquid Supervision: Patient able to self-feed Swallowing strategies  : Slow rate; Small bites/sips; Follow solids with liquids Postural changes: Position pt fully upright for meals; Stay upright 30-60 min after meals Oral care recommendations: Oral care BID (2x/day) Recommended consults: Other(comment) (could consider GI as needed pending results of esophagram) Treatment Plan Treatment Plan Treatment recommendations: No treatment recommended at this time Follow-up recommendations: No SLP follow up Functional status assessment: Patient has not had a recent decline in their functional status. Recommendations Recommendations for follow up therapy are one component of a multi-disciplinary discharge planning process, led by the attending physician.  Recommendations may be updated based on patient status, additional functional criteria and insurance authorization. Assessment: Orofacial Exam: Orofacial Exam Oral Cavity - Dentition: Adequate natural dentition; Missing dentition  Anatomy: Anatomy: WFL Boluses Administered: Boluses Administered Boluses Administered: Thin liquids (Level 0); Mildly thick liquids (Level 2, nectar thick); Moderately thick liquids (Level 3, honey thick); Puree  Oral Impairment Domain: Oral Impairment Domain Lip Closure: No labial escape Tongue control during bolus hold: Cohesive bolus between tongue to palatal seal Bolus transport/lingual motion: Slow tongue motion Oral residue: Complete oral clearance Location of oral residue : N/A Initiation of pharyngeal swallow : Pyriform sinuses  Pharyngeal Impairment Domain: Pharyngeal Impairment Domain Soft palate elevation: No bolus between soft palate (SP)/pharyngeal wall (PW) Laryngeal elevation: Complete superior movement of thyroid  cartilage with complete approximation of arytenoids to epiglottic petiole Anterior hyoid excursion: Complete anterior movement Epiglottic movement: Complete inversion Laryngeal vestibule closure: Complete, no air/contrast in laryngeal vestibule Pharyngeal stripping wave : Present - complete Pharyngeal contraction (A/P view only): N/A Pharyngoesophageal segment opening: Complete distension and complete duration, no obstruction of flow Tongue base retraction: No contrast between tongue base and posterior pharyngeal wall (PPW) Pharyngeal residue: Complete pharyngeal clearance Location of pharyngeal residue: N/A  Esophageal Impairment Domain: Esophageal Impairment Domain Esophageal clearance upright position: -- (deferred - pt having esophagram immediately following MBS) Pill: Pill Consistency administered: -- (deferred to esophagram) Penetration/Aspiration Scale Score: Penetration/Aspiration Scale Score 1.  Material does not enter airway: Puree 2.  Material enters airway, remains ABOVE vocal cords then ejected out: Thin liquids (Level 0); Mildly thick liquids (Level 2, nectar thick); Moderately thick liquids (Level 3, honey thick) Compensatory Strategies: Compensatory Strategies Compensatory  strategies: Yes Straw: Effective Effective Straw: Thin liquid (Level 0)   General Information: Caregiver present: No  Diet Prior to this Study: Regular; Thin liquids (Level 0) (temporarily made NPO for procedure but cleared by MD to complete MBS today)   Temperature : Normal   Respiratory Status: WFL   Supplemental O2: None (Room air)   History of Recent Intubation: Yes  Behavior/Cognition: Alert; Cooperative; Pleasant mood Self-Feeding Abilities: Able to self-feed Baseline vocal quality/speech: Dysphonic No data recorded Volitional Swallow: Able to elicit Exam Limitations: No limitations Goal Planning: No data recorded  No data recorded No data recorded Patient/Family Stated Goal: none stated Consulted and agree with results and recommendations: Patient Pain: Pain Assessment Pain Assessment: Faces Faces Pain Scale: 0 End of Session: Start Time:SLP Start Time (ACUTE ONLY): 1315 Stop Time: SLP Stop Time (ACUTE ONLY): 1331 Time Calculation:SLP Time Calculation (min) (ACUTE ONLY): 16 min Charges: SLP Evaluations $ SLP Speech Visit: 1 Visit SLP Evaluations $MBS Swallow: 1 Procedure SLP visit diagnosis: SLP Visit Diagnosis: Dysphagia, unspecified (R13.10) Past Medical History: Past Medical History: Diagnosis Date  Acute renal failure (ARF) 12/08/2016  Acute respiratory failure with hypoxia (HCC) 12/08/2016  AKI (acute kidney injury) 12/08/2016  Allergy   Anemia   Asthma with COPD (chronic obstructive pulmonary disease) (HCC) 11/25/2017  CAP (community acquired pneumonia) 12/08/2016  Chronic kidney disease   CKD (chronic kidney disease), stage IV (HCC) 02/11/2017  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  CVA (cerebral vascular accident) (HCC) 07/03/2012  Diffuse pulmonary alveolar hemorrhage   Edema of right lower extremity 03/25/2017  Elevated d-dimer 09/26/2024  Essential hypertension 12/08/2016  Hemoptysis 12/08/2016  High cholesterol   HLD (hyperlipidemia) 12/08/2016  Hx of adenomatous polyp of colon 04/16/2017  Hypertension    stopped taking medications 10 years ago  Hypokalemia 07/02/2012  Intra-alveolar hemorrhage 12/08/2016  Left facial numbness 07/02/2012  Sepsis (HCC) 12/08/2016  Shortness of breath 12/02/2017  Stroke (HCC)   Substance abuse (HCC) 12/08/2016  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  Tobacco abuse 12/08/2016  Type 2 diabetes mellitus without complication, with long-term current use of insulin  (HCC) 02/11/2017  Wegener's granulomatosis with renal involvement (HCC) 12/08/2016 Past Surgical History: Past Surgical History: Procedure Laterality Date  ABDOMINAL HYSTERECTOMY  1985  INSERTION OF ARTERIOVENOUS (AV) ARTEGRAFT ARM Left 10/05/2024  Procedure: INSERTION OF LEFT ARM RADIOCEPHALIC  ARTERIOVENOUS FISTULA;  Surgeon: Gretta Lonni PARAS, MD;  Location: [facility] OR;  Service: Vascular;  Laterality: Left;  INSERTION OF DIALYSIS CATHETER Right 10/05/2024  Procedure: INSERTION OF RIGHT INTERNAL JUGULAR DIALYSIS CATHETER;  Surgeon: Gretta Lonni PARAS, MD;  Location[facility] OR;  Service: Vascular;  Laterality: Right;  TUNNELED  IR TUNNELED CENTRAL VENOUS CATH [facility] W IMG  09/26/2024 Leita SAILOR., M.A. CCC-SLP Acute Rehabilitation Services Office: [phone] Secure chat preferred 10/06/2024, 4:06 PM  DG CHEST PORT 1 VIEW Result Date: 10/05/2024 CLINICAL DATA:  Status post dialysis catheter placement EXAM: PORTABLE CHEST 1 VIEW COMPARISON:  Chest radiograph dated 09/28/2024 FINDINGS: Lines/tubes: Right internal jugular venous catheter tip projects over the right atrium. Lungs: Mildly low lung volumes. Bilateral interstitial opacities, left-greater-than-right. Increased patchy and confluent left lung opacities. Pleura: Moderate to large left pleural effusion.  No pneumothorax. Heart/mediastinum: Similar enlarged cardiomediastinal silhouette. Aortic atherosclerosis. Bones: No acute osseous abnormality. IMPRESSION: 1. Right internal jugular venous catheter tip projects over the right atrium. No pneumothorax. 2. Moderate to large left  pleural effusion. 3. Bilateral interstitial opacities, left-greater-than-right, likely pulmonary edema. 4. Confluent and patchy left lung opacities may represent superimposed atelectasis or pneumonia. Electronically Signed   By: Limin  Xu M.D.   On: 10/05/2024 14:00    Medications: Infusions:  [START ON 10/08/2024] vancomycin       Scheduled Medications:  sodium chloride    Intravenous Once   amLODipine   10 mg Oral Daily   budesonide -glycopyrrolate -formoterol   2 puff Inhalation Daily   calcium  acetate  1,334 mg Oral TID WC   carvedilol   12.5 mg Oral BID WC   Chlorhexidine  Gluconate Cloth  6 each Topical Daily   darbepoetin (ARANESP ) injection - DIALYSIS  60 mcg Subcutaneous Q Mon-1800   doxazosin   2 mg Oral Daily   heparin   5,000 Units Subcutaneous Q8H   hydrALAZINE   100 mg Oral Q8H   insulin  aspart  0-15 Units Subcutaneous TID WC   pravastatin   40 mg Oral QHS    have reviewed scheduled and prn medications.  Physical Exam: General:NAD, comfortable Heart:RRR, s1s2 nl Lungs:clear b/l, no crackle Abdomen:soft, Non-tender, non-distended Extremities:No edema Dialysis Access: TDC site clean, AV fistula wound clean.  Ronit Marczak Prasad Felice Hope 10/07/2024,11:20 AM  LOS: 11 days

## 2024-10-07 NOTE — Assessment & Plan Note (Signed)
 Stable AM Weight 80.2kg ( 83 kg on admission) - Continue cardiac monitoring  - Cardiology consulted, Appreciated recommendation  - Plan for TEE today    - Continue home Coreg  12.5 mg BID, Hydralazine  100 mg TID, Amlodipine  10 mg Daily, and Doxazosin  2 mg Daily

## 2024-10-07 NOTE — Transfer of Care (Signed)
 Immediate Anesthesia Transfer of Care Note  Patient: Catherine Aguilar  Procedure(s) Performed: EGD (ESOPHAGOGASTRODUODENOSCOPY) EGD, WITH DILATION USING SAVARY-GILLIARD DILATOR OVER GUIDEWIRE BIOPSY, SKIN, SUBCUTANEOUS TISSUE, OR MUCOUS MEMBRANE ECHOCARDIOGRAM, TRANSESOPHAGEAL  Patient Location: PACU and Endoscopy Unit  Anesthesia Type:MAC  Level of Consciousness: drowsy and responds to stimulation  Airway & Oxygen Therapy: Patient Spontanous Breathing  Post-op Assessment: Report given to RN  Post vital signs: Reviewed  Last Vitals:  Vitals Value Taken Time  BP 102/44 10/07/24 15:37  Temp    Pulse 79 10/07/24 15:39  Resp 14 10/07/24 15:39  SpO2 96 % 10/07/24 15:39  Vitals shown include unfiled device data.  Last Pain:  Vitals:   10/07/24 1536  TempSrc: Temporal  PainSc: Asleep      Patients Stated Pain Goal: 6 (10/05/24 1245)  Complications: No notable events documented.

## 2024-10-07 NOTE — Assessment & Plan Note (Signed)
 DMII: BCG ACHS, Continue SSI  HLD: continue home Pravastatin  40 mg daily Chronic SVT of L small saphenous vein - Not required AC per vascular

## 2024-10-07 NOTE — Op Note (Signed)
 [facility] Patient Name: Catherine Aguilar Procedure Date : 10/07/2024 MRN: [id number] Attending MD: Aloha Finner , MD, [id number] Date of Birth: October 22, 1955 CSN: [id number] Age: 69 Admit Type: Inpatient Procedure:                Upper GI endoscopy Indications:              Dysphagia, Abnormal UGI series Providers:                Aloha Finner, MD, Gregoria Pierce, RN, Felice Sar, Technician Referring MD:              Medicines:                Monitored Anesthesia Care Complications:            No immediate complications. Estimated Blood Loss:     Estimated blood loss was minimal. Procedure:                Pre-Anesthesia Assessment:                           - Prior to the procedure, a History and Physical                            was performed, and patient medications and                            allergies were reviewed. The patient's tolerance of                            previous anesthesia was also reviewed. The risks                            and benefits of the procedure and the sedation                            options and risks were discussed with the patient.                            All questions were answered, and informed consent                            was obtained. Prior Anticoagulants: The patient has                            taken heparin , last dose was day of procedure. ASA                            Grade Assessment: III - A patient with severe                            systemic disease. After reviewing the risks and  benefits, the patient was deemed in satisfactory                            condition to undergo the procedure.                           After obtaining informed consent, the endoscope was                            passed under direct vision. Throughout the                            procedure, the patient's blood pressure, pulse, and                             oxygen saturations were monitored continuously. The                            [id number]-H190 ([id number]) Olympus endoscope was introduced                            through the mouth, and advanced to the second part                            of duodenum. The upper GI endoscopy was                            accomplished without difficulty. The patient                            tolerated the procedure. Scope In: Scope Out: Findings:      The examined esophagus was significantly tortuous. Biopsies were taken       with a cold forceps for histology to rule out EoE/LoE. After the rest of       the EGD was completed, a guidewire was placed and the scope was       withdrawn. Dilation was performed with a Savary dilator with mild       resistance at 16 mm. The dilation site was examined following endoscope       reinsertion and showed just above the EGJxn a mild-moderate mucosal       disruption, moderate improvement in luminal narrowing and no perforation.      The Z-line was irregular and was found 40 cm from the incisors.      A 1 cm hiatal hernia was present.      Patchy moderate inflammation characterized by erosions and erythema was       found in the entire examined stomach. Biopsies were taken with a cold       forceps for histology and Helicobacter pylori testing.      Localized granular mucosa was found in the prepyloric region of the       stomach (encompassing the inferior portion of the pylorus ~50%).       Biopsies were taken with a cold forceps for histology to rule out       dysplasia.      No gross lesions were noted in  the duodenal bulb, in the first portion       of the duodenum and in the second portion of the duodenum. Impression:               - Tortuous esophagus. Biopsied. Dilated to 16 mm                            Savary with mild-moderate mucosal disruption just                            above EGJxn.                           - Z-line irregular, 40 cm from the  incisors.                           - 1 cm hiatal hernia.                           - Gastritis. Biopsied.                           - Granular gastric mucosa at prepylorus. Biopsied.                           - No gross lesions in the duodenal bulb, in the                            first portion of the duodenum and in the second                            portion of the duodenum. Recommendation:           - The patient will be observed post-procedure,                            until all discharge criteria are met.                           - Return patient to hospital ward for ongoing care.                           - ADAT starting with soft foods.                           - PPI 40 mg BID for next 2-months then once daily.                           - Depending on how patient does overall, repeat                            dilation could be helpful to try and get up to 18                            mm pending her symptoms of dysphagia.                           -  Observe patient's clinical course.                           - Await pathology results.                           - Inpatient GI will signoff at this time but will                            be available if issues arise (followup with Dr.                            Avram can be arranged in necessary in future).                           - VTE PPx can be restart in 24 hours, if                            anticoagulation needed sooner then consider IV                            heparin  drip without bolus @1000PM .                           - The findings and recommendations were discussed                            with the patient.                           - The findings and recommendations were discussed                            with the referring physician. Procedure Code(s):        --- Professional ---                           [phone], Esophagogastroduodenoscopy, flexible,                            transoral; with  insertion of guide wire followed by                            passage of dilator(s) through esophagus over guide                            wire                           43239, 59, Esophagogastroduodenoscopy, flexible,                            transoral; with biopsy, single or multiple Diagnosis Code(s):        --- Professional ---  Q39.9, Congenital malformation of esophagus,                            unspecified                           K22.89, Other specified disease of esophagus                           K44.9, Diaphragmatic hernia without obstruction or                            gangrene                           K29.70, Gastritis, unspecified, without bleeding                           K31.89, Other diseases of stomach and duodenum                           R13.10, Dysphagia, unspecified                           R93.3, Abnormal findings on diagnostic imaging of                            other parts of digestive tract CPT copyright 2022 [employer]. All rights reserved. The codes documented in this report are preliminary and upon coder review may  be revised to meet current compliance requirements. Aloha Finner, MD 10/07/2024 3:50:20 PM Number of Addenda: 0

## 2024-10-07 NOTE — CV Procedure (Signed)
 INDICATIONS: bacteremia  PROCEDURE:   Informed consent was obtained prior to the procedure. The risks, benefits and alternatives for the procedure were discussed and the patient comprehended these risks.  Risks include, but are not limited to, cough, sore throat, vomiting, nausea, somnolence, esophageal and stomach trauma or perforation, bleeding, low blood pressure, aspiration, pneumonia, infection, trauma to the teeth and death.    Procedural time out performed.  During this procedure the patient was administered propofol  per anesthesia.  The patient's heart rate, blood pressure, and oxygen saturation were monitored continuously during the procedure. The period of conscious sedation was 20 minutes, of which I was present face-to-face 100% of this time.  The transesophageal probe was inserted in the esophagus and stomach without difficulty and multiple views were obtained.  The patient was kept under observation until the patient left the procedure room.  The patient left the procedure room in stable condition.   Agitated microbubble saline contrast was administered.  COMPLICATIONS:    There were no immediate complications.  FINDINGS:   FORMAL ECHOCARDIOGRAM REPORT PENDING No vegetations on the HD line There is mitral annular calcifications, no significant MR and no definite mitral valve vegetations, formal report to follow. No other valvular vegetations noted.   RECOMMENDATIONS:     Proceed to EGD  Time Spent Directly with the Patient:  30 minutes   Raziah Funnell A Elliett Guarisco 10/07/2024, 3:19 PM

## 2024-10-07 NOTE — Progress Notes (Signed)
" °  Echocardiogram Echocardiogram Transesophageal has been performed.  Devora Ellouise SAUNDERS 10/07/2024, 3:31 PM "

## 2024-10-07 NOTE — Progress Notes (Signed)
" ° °  Informational Antimicrobial Plans with Dialysis   Indication: MRSA bacteremia  Regimen: Vancomycin  IV 750 mg post-HD on dialysis days (T-Th-Sat HD schedule)  End date: 10/28/2024   This is not an official OPAT note and is intended to be only informational that the patient will receive antimicrobial therapy after dialysis sessions.    Thank you for allowing pharmacy to be a part of this patient's care.    Feliciano Close, PharmD PGY2 Infectious Diseases Pharmacy Resident   "

## 2024-10-07 NOTE — Assessment & Plan Note (Signed)
 HgB 7.2 this morning, last dose ESA on 01/25. Well tolerated iHD yesterday UF removed 1.5L  - Nephrology consulted, appreciate recommendations. - Continue phosphate binder calcium  acetate 667mg  TID with meals - Will get 1 u pRBC this morning - Transfusion threshold <8   - VVS consulted, appreciate recommendations (s/o 01/27)  - Follow up in 6 weeks to check maturation of AVF - Continue Tylenol  650 mg q6hrs PRN PO or rectal for pain - Avoid nephrotoxic agents - Strict I&Os, daily weights - Daily RFP

## 2024-10-07 NOTE — Plan of Care (Signed)
 " Problem: Education: Goal: Ability to describe self-care measures that may prevent or decrease complications (Diabetes Survival Skills Education) will improve Outcome: Progressing   Problem: Coping: Goal: Ability to adjust to condition or change in health will improve Outcome: Progressing   Problem: Fluid Volume: Goal: Ability to maintain a balanced intake and output will improve Outcome: Progressing   Problem: Health Behavior/Discharge Planning: Goal: Ability to identify and utilize available resources and services will improve Outcome: Progressing Goal: Ability to manage health-related needs will improve Outcome: Progressing   Problem: Metabolic: Goal: Ability to maintain appropriate glucose levels will improve Outcome: Progressing   Problem: Nutritional: Goal: Maintenance of adequate nutrition will improve Outcome: Progressing Goal: Progress toward achieving an optimal weight will improve Outcome: Progressing   Problem: Skin Integrity: Goal: Risk for impaired skin integrity will decrease Outcome: Progressing   Problem: Tissue Perfusion: Goal: Adequacy of tissue perfusion will improve Outcome: Progressing   Problem: Education: Goal: Knowledge of disease and its progression will improve Outcome: Progressing   Problem: Health Behavior/Discharge Planning: Goal: Ability to manage health-related needs will improve Outcome: Progressing   Problem: Clinical Measurements: Goal: Complications related to the disease process or treatment will be avoided or minimized Outcome: Progressing Goal: Dialysis access will remain free of complications Outcome: Progressing   Problem: Activity: Goal: Activity intolerance will improve Outcome: Progressing   Problem: Fluid Volume: Goal: Fluid volume balance will be maintained or improved Outcome: Progressing   Problem: Nutritional: Goal: Ability to make appropriate dietary choices will improve Outcome: Progressing   Problem:  Respiratory: Goal: Respiratory symptoms related to disease process will be avoided Outcome: Progressing   Problem: Self-Concept: Goal: Body image disturbance will be avoided or minimized Outcome: Progressing   Problem: Urinary Elimination: Goal: Progression of disease will be identified and treated Outcome: Progressing   Problem: Education: Goal: Knowledge of General Education information will improve Description: Including pain rating scale, medication(s)/side effects and non-pharmacologic comfort measures Outcome: Progressing   Problem: Health Behavior/Discharge Planning: Goal: Ability to manage health-related needs will improve Outcome: Progressing   Problem: Clinical Measurements: Goal: Ability to maintain clinical measurements within normal limits will improve Outcome: Progressing Goal: Will remain free from infection Outcome: Progressing Goal: Diagnostic test results will improve Outcome: Progressing Goal: Respiratory complications will improve Outcome: Progressing Goal: Cardiovascular complication will be avoided Outcome: Progressing   Problem: Activity: Goal: Risk for activity intolerance will decrease Outcome: Progressing   Problem: Nutrition: Goal: Adequate nutrition will be maintained Outcome: Progressing   Problem: Coping: Goal: Level of anxiety will decrease Outcome: Progressing   Problem: Elimination: Goal: Will not experience complications related to bowel motility Outcome: Progressing Goal: Will not experience complications related to urinary retention Outcome: Progressing   Problem: Pain Managment: Goal: General experience of comfort will improve and/or be controlled Outcome: Progressing   Problem: Safety: Goal: Ability to remain free from injury will improve Outcome: Progressing   Problem: Skin Integrity: Goal: Risk for impaired skin integrity will decrease Outcome: Progressing   Problem: Education: Goal: Knowledge of the prescribed  therapeutic regimen will improve Outcome: Progressing   Problem: Bowel/Gastric: Goal: Gastrointestinal status for postoperative course will improve Outcome: Progressing   Problem: Cardiac: Goal: Ability to maintain an adequate cardiac output Outcome: Progressing Goal: Will show no evidence of cardiac arrhythmias Outcome: Progressing   Problem: Nutritional: Goal: Will attain and maintain optimal nutritional status Outcome: Progressing   Problem: Neurological: Goal: Will regain or maintain usual level of consciousness Outcome: Progressing   Problem: Clinical Measurements: Goal: Ability to  maintain clinical measurements within normal limits Outcome: Progressing Goal: Postoperative complications will be avoided or minimized Outcome: Progressing   "

## 2024-10-07 NOTE — Assessment & Plan Note (Signed)
 CT neck w/ contrast on 01/27 shows medialization of left true vocal cord toward midline ( paralysis vs palsy) w/ intact Vagus n. - ENT consulted, appreciate recommendations

## 2024-10-07 NOTE — Anesthesia Preprocedure Evaluation (Addendum)
 "                                  Anesthesia Evaluation  Patient identified by MRN, date of birth, ID band Patient awake    Reviewed: Allergy & Precautions, NPO status , Patient's Chart, lab work & pertinent test results, reviewed documented beta blocker date and time   Airway Mallampati: II  TM Distance: >3 FB Neck ROM: Full    Dental  (+) Teeth Intact, Dental Advisory Given   Pulmonary asthma , pneumonia, unresolved, COPD, former smoker   Pulmonary exam normal breath sounds clear to auscultation       Cardiovascular hypertension, Pt. on home beta blockers and Pt. on medications + CAD and +CHF  Normal cardiovascular exam Rhythm:Regular Rate:Normal  TTE 2026 1. Left ventricular ejection fraction, by estimation, is 70 to 75%. The  left ventricle has hyperdynamic function. The left ventricle has no  regional wall motion abnormalities. There is severe asymmetric left  ventricular hypertrophy of the septal  segment. Turbulent flow/flow accerelation in the LVOT likjely from a  combination of severe LVH and hyperdynamic LV function. Left ventricular  diastolic parameters are consistent with Grade I diastolic dysfunction  (impaired relaxation). Elevated left  ventricular end-diastolic pressure. The average left ventricular global  longitudinal strain is -17.6 %. The global longitudinal strain is normal.   2. Right ventricular systolic function is normal. The right ventricular  size is moderately enlarged. There is mildly elevated pulmonary artery  systolic pressure. The estimated right ventricular systolic pressure is  36.9 mmHg.   3. Left atrial size was mildly dilated.   4. Right atrial size was mild to moderately dilated.   5. The mitral valve is grossly normal. No evidence of mitral valve  regurgitation. No evidence of mitral stenosis.   6. The aortic valve was not well visualized. Aortic valve regurgitation  is not visualized. No aortic stenosis is present.   7.  The inferior vena cava is dilated in size with >50% respiratory  variability, suggesting right atrial pressure of 8 mmHg.   8. Increased flow velocities may be secondary to anemia, thyrotoxicosis,  hyperdynamic or high flow state.   9. Small to moderate pericardial effusion is located posterior and  lateral to the left ventricle.     Neuro/Psych CVA  negative psych ROS   GI/Hepatic Neg liver ROS,GERD  ,,  Endo/Other  diabetes, Type 2, Insulin  Dependent    Renal/GU ESRF and DialysisRenal disease (last dialysis yesterday)Wegener's granulomatosis  Lab Results      Component                Value               Date                      NA                       132 (L)             10/07/2024                CL                       92 (L)              10/07/2024  K                        3.7                 10/07/2024                CO2                      26                  10/07/2024                BUN                      55 (H)              10/07/2024                CREATININE               7.28 (H)            10/07/2024                GFRNONAA                 6 (L)               10/07/2024                CALCIUM                   8.9                 10/07/2024                PHOS                     5.4 (H)             10/07/2024                ALBUMIN                  3.2 (L)             10/07/2024                GLUCOSE                  102 (H)             10/07/2024             negative genitourinary   Musculoskeletal negative musculoskeletal ROS (+)    Abdominal   Peds  Hematology  (+) Blood dyscrasia, anemia Lab Results      Component                Value               Date                      WBC                      12.5 (H)            10/07/2024                HGB  8.7 (L)             10/07/2024                HCT                      26.5 (L)            10/07/2024                MCV                      81.6                 10/07/2024                PLT                      349                 10/07/2024              Anesthesia Other Findings 69 y.o. female who was admitted 12 days ago with complaints of dyspnea felt secondary to acute on chronic congestive heart failure with volume overload.  Also noted to have acute kidney injury on chronic kidney disease stage V.  She also has diagnosis of Wegener's granulomatosis, COPD, diabetes mellitus, hypertension. She required temporary hemodialysis catheter on 09/26/2024, unfortunately developed MRSA bacteremia related to the line which was removed on 09/30/2024. She has also been treated for a multifocal pneumonia. On 10/05/2024 she underwent placement of a right jugular tunneled catheter and creation of a left AV fistula. Now presents for TEE to rule out endocarditis and EGD for intermittent solid food dysphagia, abnormal barium swallow   Reproductive/Obstetrics                              Anesthesia Physical Anesthesia Plan  ASA: 4  Anesthesia Plan: MAC   Post-op Pain Management:    Induction: Intravenous  PONV Risk Score and Plan: Propofol  infusion and Treatment may vary due to age or medical condition  Airway Management Planned: Natural Airway  Additional Equipment:   Intra-op Plan:   Post-operative Plan:   Informed Consent: I have reviewed the patients History and Physical, chart, labs and discussed the procedure including the risks, benefits and alternatives for the proposed anesthesia with the patient or authorized representative who has indicated his/her understanding and acceptance.     Dental advisory given  Plan Discussed with: CRNA  Anesthesia Plan Comments:          Anesthesia Quick Evaluation  "

## 2024-10-07 NOTE — Consult Note (Cosign Needed)
 "   Consultation  Referring Provider: Medicine service / [place] Primary Care Physician:  Adele Song, MD Primary Gastroenterologist:  Dr.Gessner- 2018  Reason for Consultation: Intermittent solid food dysphagia, abnormal barium swallow  HPI: Catherine Aguilar is a 69 y.o. female who was admitted 12 days ago with complaints of dyspnea felt secondary to acute on chronic congestive heart failure with volume overload.  Also noted to have acute kidney injury on chronic kidney disease stage V.  She also has diagnosis of Wegener's granulomatosis, COPD, diabetes mellitus, hypertension. She required meant of a temporary hemodialysis catheter on 09/26/2024, unfortunately developed MRSA bacteremia related to the line which was removed on 09/30/2024.  Covered with vancomycin . 2D echo did not show any evidence of endocarditis. She has also been treated for a multifocal pneumonia. On 10/05/2024 she underwent placement of a right jugular tunneled catheter and creation of a left AV fistula.  She had some issues earlier in her admission with stridor and has diagnosis of left vocal cord dysfunction.  She has been improving over the past couple of days, is not requiring any oxygen, has been undergoing dialysis.  Delirium noted earlier in admission seems to be resolving.  Speech path eval yesterday with modified swallowing study showed a functional oropharyngeal swallow with no residue or aspiration observed, there was trace transient penetration of thin liquids that clears upon completion of the swallow.  Patient had mentioned some issues with solid food dysphagia and therefore underwent barium swallow yesterday.  Barium swallow shows narrowing at the GE junction and transient hang up of the barium tablet in that area, this could be associated with a stricture, esophageal motility within normal limits.  Patient has not had prior EGD, she has had previous colonoscopy in 2018/Dr. Avram with 4 sessile polyps  removed, 1 of these was a tubular adenoma, 1 was hyperplastic, recommended for recall in 2023.   Patient says she has noticed at home over the past few months that she has had intermittent solid food dysphagia which seems to be occurring on a more regular basis.  She has sensation of food stopping in her lower chest.  She says sometimes she gets up and pounds on her chest other times if she waits the food will traverse.  She has had a couple of occasions usually after pills with regurgitation.  No complaints of heartburn or indigestion.  No current complaints of abdominal pain.  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   CVA (cerebral vascular accident) (HCC) 07/03/2012   Diffuse pulmonary alveolar hemorrhage    Edema of right lower extremity 03/25/2017   Elevated d-dimer 09/26/2024   Essential hypertension 12/08/2016   Hemoptysis 12/08/2016   High cholesterol    HLD (hyperlipidemia) 12/08/2016   Hx of adenomatous polyp of colon 04/16/2017   Hypertension    stopped taking medications 10 years ago   Hypokalemia 07/02/2012   Intra-alveolar hemorrhage 12/08/2016  Left facial numbness 07/02/2012   Sepsis (HCC) 12/08/2016   Shortness of breath 12/02/2017   Stroke (HCC)    Substance abuse (HCC) 12/08/2016   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   Tobacco abuse 12/08/2016   Type 2 diabetes mellitus without  complication, with long-term current use of insulin  (HCC) 02/11/2017   Wegener's granulomatosis with renal involvement (HCC) 12/08/2016    Past Surgical History:  Procedure Laterality Date   ABDOMINAL HYSTERECTOMY  1985   INSERTION OF ARTERIOVENOUS (AV) ARTEGRAFT ARM Left 10/05/2024   Procedure: INSERTION OF LEFT ARM RADIOCEPHALIC  ARTERIOVENOUS FISTULA;  Surgeon: Gretta Lonni PARAS, MD;  Location: [facility] OR;  Service: Vascular;  Laterality: Left;   INSERTION OF DIALYSIS CATHETER Right 10/05/2024   Procedure: INSERTION OF RIGHT INTERNAL JUGULAR DIALYSIS CATHETER;  Surgeon: Gretta Lonni PARAS, MD;  Location: [facility] OR;  Service: Vascular;  Laterality: Right;  TUNNELED   IR TUNNELED CENTRAL VENOUS CATH PLC W IMG  09/26/2024    Prior to Admission medications  Medication Sig Start Date End Date Taking? Authorizing Provider  albuterol  (PROVENTIL ) (2.5 MG/3ML) 0.083% nebulizer solution Take 3 mLs (2.5 mg total) by nebulization every 6 (six) hours as needed for wheezing or shortness of breath. 11/06/20  Yes Lenon Lacks C, DO  albuterol  (VENTOLIN  HFA) 108 (90 Base) MCG/ACT inhaler INHALE 2 PUFFS BY MOUTH EVERY 4-6 HOURS Patient taking differently: Inhale 2 puffs into the lungs See admin instructions. INHALE 2 PUFFS INTO THE LUNGS EVERY 4-6 HOURS 06/29/24  Yes Hindel, Leah, MD  amLODipine  (NORVASC ) 10 MG tablet Take 10 mg by mouth at bedtime. 12/06/23  Yes [provider]  aspirin  81 MG EC tablet TAKE 1 TABLET BY MOUTH EVERY DAY Patient taking differently: Take 81 mg by mouth daily. 08/09/20  Yes Lenon Lacks C, DO  azelastine  (ASTELIN ) 0.1 % nasal spray Place 2 sprays into both nostrils 2 (two) times daily. Use in each nostril as directed Patient taking differently: Place 2 sprays into both nostrils 2 (two) times daily as needed for rhinitis or allergies. 06/26/24  Yes [name], MD  benzonatate  (TESSALON ) 200 MG capsule TAKE 1 CAPSULE (200 MG TOTAL) BY MOUTH 3 (THREE) TIMES DAILY AS  NEEDED FOR COUGH. 09/25/24  Yes Hope Almarie ORN, NP  doxazosin  (CARDURA ) 2 MG tablet Take 2 mg by mouth in the morning and at bedtime. 10/31/23  Yes [provider]  insulin  glargine (LANTUS  SOLOSTAR) 100 UNIT/ML Solostar Pen Inject 5 Units into the skin at bedtime. 03/11/24  Yes Hindel, Leah, MD  labetalol  (NORMODYNE ) 300 MG tablet Take 300 mg by mouth in the morning and at bedtime. 12/06/23  Yes [provider]  pravastatin  (PRAVACHOL ) 40 MG tablet Take 40 mg by mouth at bedtime. 05/09/24  Yes [provider]  sodium bicarbonate  650 MG tablet Take 2 tablets (1,300 mg total) by mouth 2 (two) times daily. 11/06/20  Yes Lenon Lacks BROCKS, DO  torsemide (DEMADEX) 100 MG tablet Take 100 mg by mouth in the morning. 04/16/24  Yes [provider]  B-D UF III MINI PEN NEEDLES 31G X 5 MM MISC USE AS DIRECTED 12/16/23   Christia Budds, MD  budesonide -glycopyrrolate -formoterol  (BREZTRI  AEROSPHERE) 160-9-4.8 MCG/ACT AERO inhaler Inhale 2 puffs into the lungs in the morning and at bedtime. Patient not taking: Reported on 08/17/2024 08/14/24   Hope Almarie ORN, NP  fluticasone  (FLONASE ) 50 MCG/ACT nasal spray Place 2 sprays into both nostrils daily. Patient not taking: Reported on 08/17/2024 09/14/19   Hope Almarie ORN,  NP  glucose blood (ACCU-CHEK GUIDE) test strip Check blood glucose prior to lantus  administration 04/18/23   Jagadish, Mayuri, MD  guaiFENesin  (ROBITUSSIN) 100 MG/5ML liquid Take 5 mLs by mouth every 4 (four) hours as needed for cough or to loosen phlegm. Patient not taking: No sig reported 12/12/23   Tharon Lung, MD  Lancets Misc. (ACCU-CHEK SOFTCLIX LANCET DEV) KIT Check BG prior to administering insulin  04/18/23   Christia Budds, MD  Spacer/Aero-Holding Raguel DEVI Please use with all inhaled medications 07/15/24   Adele Song, MD    Current Facility-Administered Medications  Medication Dose Route Frequency Provider Last Rate Last Admin   0.9 %  sodium chloride   infusion (Manually program via Guardrails IV Fluids)   Intravenous Once Lupie Credit, DO       acetaminophen  (TYLENOL ) tablet 650 mg  650 mg Oral Q6H PRN Lupie Credit, DO   650 mg at 10/02/24 2136   Or   acetaminophen  (TYLENOL ) suppository 650 mg  650 mg Rectal Q6H PRN Lupie Credit, DO       amLODipine  (NORVASC ) tablet 10 mg  10 mg Oral Daily Majeed, Sara, DO   10 mg at 10/07/24 9186   benzonatate  (TESSALON ) capsule 200 mg  200 mg Oral TID PRN Adele Song, MD   200 mg at 10/06/24 1639   budesonide -glycopyrrolate -formoterol  (BREZTRI ) 160-9-4.8 MCG/ACT inhaler 2 puff  2 puff Inhalation Daily Lupie Credit, DO   2 puff at 10/07/24 0815   calcium  acetate (PHOSLO ) capsule 1,334 mg  1,334 mg Oral TID WC Bhandari, Dron Prasad, MD       carvedilol  (COREG ) tablet 12.5 mg  12.5 mg Oral BID WC Majeed, Sara, DO   12.5 mg at 10/07/24 9185   Chlorhexidine  Gluconate Cloth 2 % PADS 6 each  6 each Topical Daily Donzetta Rollene BRAVO, MD   6 each at 10/07/24 9183   Darbepoetin Alfa  (ARANESP ) injection 60 mcg  60 mcg Subcutaneous Q Mon-1800 Bhandari, Dron Prasad, MD   60 mcg at 10/05/24 2109   diphenhydrAMINE -zinc  acetate (BENADRYL ) 2-0.1 % cream 1 Application  1 Application Topical BID PRN Bhagat, Virali, DO       doxazosin  (CARDURA ) tablet 2 mg  2 mg Oral Daily Majeed, Sara, DO   2 mg at 10/06/24 1640   heparin  injection 5,000 Units  5,000 Units Subcutaneous Q8H Esterwood, Amy S, PA-C   5,000 Units at 10/07/24 0536   hydrALAZINE  (APRESOLINE ) tablet 100 mg  100 mg Oral Q8H Barbaraann Darryle Ned, MD   100 mg at 10/07/24 0535   HYDROcodone -acetaminophen  (NORCO/VICODIN) 5-325 MG per tablet 1-2 tablet  1-2 tablet Oral Q4H PRN Bethanie Cough, PA-C   1 tablet at 10/07/24 9185   hydrOXYzine  (ATARAX ) tablet 25 mg  25 mg Oral TID PRN Suknaim, Kulkaew B, DO   25 mg at 10/06/24 9490   insulin  aspart (novoLOG ) injection 0-15 Units  0-15 Units Subcutaneous TID WC Lupie Credit, DO   2 Units at 10/06/24 0654   ipratropium-albuterol   (DUONEB) 0.5-2.5 (3) MG/3ML nebulizer solution 3 mL  3 mL Nebulization Q2H PRN Bhagat, Virali, DO   3 mL at 09/29/24 0402   Oral care mouth rinse  15 mL Mouth Rinse PRN Donzetta Rollene BRAVO, MD       pravastatin  (PRAVACHOL ) tablet 40 mg  40 mg Oral QHS Majeed, Sara, DO   40 mg at 10/06/24 2130   [START ON 10/08/2024] vancomycin  (VANCOREADY) IVPB 750 mg/150 mL  750 mg Intravenous Q T,Th,Sa-HD Migdalia Feliciano RAMAN, RPH  Allergies as of 09/25/2024   (No Known Allergies)    Family History  Problem Relation Age of Onset   Heart attack Mother    Diabetes Mother    Hypertension Mother    Stroke Mother    Cancer Father    Alcohol abuse Father    Diabetes Father    Hypertension Father    Breast cancer Neg Hx    Colon cancer Neg Hx    Esophageal cancer Neg Hx    Rectal cancer Neg Hx    Stomach cancer Neg Hx    Pancreatic cancer Neg Hx    BRCA 1/2 Neg Hx     Social History   Socioeconomic History   Marital status: Married    Spouse name: Not on file   Number of children: Not on file   Years of education: Not on file   Highest education level: Not on file  Occupational History   Not on file  Tobacco Use   Smoking status: Former    Current packs/day: 0.00    Average packs/day: 0.2 packs/day for 50.5 years (10.1 ttl pk-yrs)    Types: Cigarettes    Start date: 09/10/1968    Quit date: 03/25/2019    Years since quitting: 5.5   Smokeless tobacco: Never  Vaping Use   Vaping status: Never Used  Substance and Sexual Activity   Alcohol use: Yes    Comment: on the weekends - moderately per pt   Drug use: Yes    Types: Marijuana    Comment: Friday, Saturday, Sunday   Sexual activity: Not Currently  Other Topics Concern   Not on file  Social History Narrative   Not on file   Social Drivers of Health   Tobacco Use: Medium Risk (09/29/2024)   Patient History    Smoking Tobacco Use: Former    Smokeless Tobacco Use: Never    Passive Exposure: Not on Actuary Strain:  Low Risk (06/11/2024)   Overall Financial Resource Strain (CARDIA)    Difficulty of Paying Living Expenses: Not hard at all  Food Insecurity: No Food Insecurity (09/26/2024)   Epic    Worried About Radiation Protection Practitioner of Food in the Last Year: Never true    Ran Out of Food in the Last Year: Never true  Transportation Needs: No Transportation Needs (09/26/2024)   Epic    Lack of Transportation (Medical): No    Lack of Transportation (Non-Medical): No  Physical Activity: Inactive (06/11/2024)   Exercise Vital Sign    Days of Exercise per Week: 0 days    Minutes of Exercise per Session: 0 min  Stress: No Stress Concern Present (06/11/2024)   [employer] of Occupational Health - Occupational Stress Questionnaire    Feeling of Stress: Not at all  Social Connections: Moderately Integrated (09/26/2024)   Social Connection and Isolation Panel    Frequency of Communication with Friends and Family: Three times a week    Frequency of Social Gatherings with Friends and Family: Three times a week    Attends Religious Services: More than 4 times per year    Active Member of Clubs or Organizations: No    Attends Banker Meetings: Never    Marital Status: Married  Catering Manager Violence: Not At Risk (09/26/2024)   Epic    Fear of Current or Ex-Partner: No    Emotionally Abused: No    Physically Abused: No    Sexually Abused: No  Depression (PHQ2-9): Low  Risk (07/15/2024)   Depression (PHQ2-9)    PHQ-2 Score: 1  Alcohol Screen: Low Risk (05/21/2023)   Alcohol Screen    Last Alcohol Screening Score (AUDIT): 2  Housing: Low Risk (09/26/2024)   Epic    Unable to Pay for Housing in the Last Year: No    Number of Times Moved in the Last Year: 1    Homeless in the Last Year: No  Utilities: Not At Risk (09/26/2024)   Epic    Threatened with loss of utilities: No  Health Literacy: Adequate Health Literacy (06/11/2024)   B1300 Health Literacy    Frequency of need for help with medical  instructions: Never    Review of Systems: Pertinent positive and negative review of systems were noted in the above HPI section.  All other review of systems was otherwise negative.   Physical Exam: Vital signs in last 24 hours: Temp:  [97.9 F (36.6 C)-98.7 F (37.1 C)] 98.4 F (36.9 C) (01/28 0856) Pulse Rate:  [76-91] 82 (01/28 0856) Resp:  [15-22] 15 (01/28 0856) BP: (118-149)/(51-72) 118/61 (01/28 0856) SpO2:  [90 %-99 %] 90 % (01/28 0823) Weight:  [78.4 kg-80.2 kg] 80.2 kg (01/28 0313) Last BM Date : 10/06/24 General:   Alert,  Well-developed, well-nourished, older African-American female ,pleasant and cooperative in NAD Head:  Normocephalic and atraumatic. Eyes:  Sclera clear, no icterus.  Bilateral exophthalmos Ears:  Normal auditory acuity. Nose:  No deformity, discharge,  or lesions. Mouth:  No deformity or lesions.   Neck:  Supple; no masses or thyromegaly.  Right neck dialysis catheter Lungs:  Clear throughout to auscultation.   No wheezes, crackles, or rhonchi.  Heart:  Regular rate and rhythm; no murmurs, clicks, rubs,  or gallops. Abdomen:  Soft,nontender, BS active,nonpalp mass or hsm.   Rectal: Not done Msk:  Symmetrical without gross deformities. . Pulses:  Normal pulses noted. Extremities:  Without clubbing or edema. Neurologic:  Alert and  oriented x4;  grossly normal neurologically. Skin:  Intact without significant lesions or rashes.. Psych:  Alert and cooperative. Normal mood and affect.  Intake/Output from previous day: 01/27 0701 - 01/28 0700 In: -  Out: 1500  Intake/Output this shift: Total I/O In: 30 [P.O.:30] Out: 0   Lab Results: Recent Labs    10/05/24 0122 10/06/24 0330 10/07/24 0404  WBC 11.0* 15.8* 12.5*  HGB 7.8* 7.1* 7.2*  HCT 23.7* 22.0* 22.2*  PLT 272 353 349   BMET Recent Labs    10/05/24 0516 10/06/24 0330 10/07/24 0404  NA 133* 132* 132*  K 4.4 4.8 3.7  CL 90* 90* 92*  CO2 17* 18* 26  GLUCOSE 70 135* 102*  BUN  106* 111* 55*  CREATININE 12.00* 12.60* 7.28*  CALCIUM  9.1 9.2 8.9   LFT Recent Labs    10/07/24 0404  ALBUMIN 3.2*   PT/INR No results for input(s): LABPROT, INR in the last 72 hours. Hepatitis Panel No results for input(s): HEPBSAG, HCVAB, HEPAIGM, HEPBIGM in the last 72 hours.   IMPRESSION:  #45 69 year old African-American female admitted 12 days ago with dyspnea felt secondary to acute on chronic congestive heart failure with volume overload.  She has been diuresed.  Parameters improved, not requiring oxygen  #2 multifocal pneumonia-resolving #3 acute kidney injury on chronic kidney disease stage V-required initiation of dialysis this admission. Right jugular tunneled catheter placed 10/05/2024 and creation of left AV fistula  #4 MRSA bacteremia secondary to initial placement of temporary dialysis catheter 09/26/2024/line removed 2D  echo negative for endocarditis, TEE to be done today  #5 diabetes mellitus #6 history of hypertension #7 Wegener's granulomatosis #8 COPD  #9 anemia chronic belt secondary to chronic kidney disease-being transfused today, on Aranesp   #10 intermittent solid food dysphagia, barium swallow yesterday concerning for distal esophageal stricture, esophageal motility normal  Plan; patient has been kept n.p.o. today for TEE Hold subcu heparin  Hopefully we will be able to coordinate EGD with esophageal dilation to be done this afternoon with the same sedation around the time of TEE.  Procedure will be done by Dr. Wilhelmenia .Procedure was discussed in detail with the patient including indications risks and benefits and she is agreeable to proceed. Further GI recommendations post EGD    Amy Esterwood PA-C 10/07/2024, 10:32 AM    Attending Physician's Attestation   I have taken an interval history, reviewed the chart and examined the patient.   This is a patient that the GI service is asked to evaluate in the setting of solid food  dysphagia and abnormal barium swallow.  Patient admitted to the hospital and has MRSA bacteremia for which she is actually undergoing a TEE later today.  She has had intermittent issues of solid food dysphagia but more recently states that nearly every day she is experiencing some slowness with the passage of her food and rarely is regurgitating.  Based on SLP evaluation she was noted to have some penetration of thin liquids which led to a barium swallow subsequently showing signs of slow passage of the barium tablet.  Colonoscopy years ago but no prior endoscopy.  On exam she is comfortable and not completely clear why she is undergoing a TEE but otherwise understands the reason why GI has become involved.  Endoscopic evaluation with biopsies and dilation is recommended.  The risks and benefits of endoscopic evaluation/treatment were discussed with the patient and/or family; these include but are not limited to the risk of perforation, infection, bleeding, missed lesions, lack of diagnosis, severe illness requiring hospitalization, as well as anesthesia and sedation related illnesses.  The patient's history has been reviewed, patient examined, no change in status, and deemed stable for procedure.  The patient and/or family was provided an opportunity to ask questions and all were answered.  The patient and/or family is agreeable to proceed.  We will try to coordinate this with TEE later today and make this a single anesthesia session.  All patient questions were answered to the best of my ability, and the patient agrees to the aforementioned plan of action with follow-up as indicated.   I agree with the Advanced Practitioner's note, impression, and recommendations with updates and my documentation as noted above.  The majority of the medical decision making/process, formulation of the impression/plan of action for the patient were performed by me with substantive portion of this encounter (>50% time spent  including complete performance of at least one of the key components of MDM, History, and/or Exam).   Aloha Wilhelmenia, MD [facility] Advanced [facility]oscopy Office # [phone]    "

## 2024-10-07 NOTE — Assessment & Plan Note (Signed)
 GE stricture found on Esophagram  - GI consulted, appreciated recommendations

## 2024-10-07 NOTE — Progress Notes (Signed)
 "    Daily Progress Note Intern Pager: [phone]  Patient name: Catherine Aguilar Medical record number: [id number] Date of birth: 1956-03-09 Age: 69 y.o. Gender: female  Primary Care Provider: Adele Song, MD Consultants: Nephrology, Cardiology, Vasc Surg, ENT, ID, GI Code Status: Full Code  Pt Overview and Major Events to Date:  01/17 : Admitted, Vasc cath placed 01/18 : Starting iHD 01/19 - 01/20 : RRT x3 d/t worsening WOB - c/o Vocal cord paralysis/airway obstruction 01/21 : Fiberoptic Laryngoscopy confirmed Left Vocal cord paralysis. Vasc Cath removed d/t + MRSA on Bcx  01/26 : Tunneled cath placement by Vasc Surg  [name] is a 69 YO Female w/ PMH of DMII, HTN, ERSD, Anemia of Chronic Disease, COPD, and Wegener's granulomatosis with renal involvement. She initially admitted for CHF exacerbation. Her hospitalization has been complicated by worsening kidney fx required iHD, MRSA bacteremia from [facility] access required access replacement, and left vocal cord paralysis which work up found to have GE stricture.  Assessment & Plan MRSA bacteremia Aspiration pneumonia of both lungs (HCC) Blood Cx neg to date. Stable WBC 12.5 from 15 yesterday  - Continue contact isolation (+ MRSA)  - ID consulted, appreciate recommendations - Continue vancomycin  (1/19 - ) - Completed PO Augmentin  250-125 mg q12hrs (1/20-1/24) for PNA - Recommend TEE given persistent leukocytosis  - Plan on TEE today - Daily CBC ESRD on hemodialysis (HCC) Anemia in chronic kidney disease (CKD) Secondary hyperparathyroidism HgB 7.2 this morning, last dose ESA on 01/25. Well tolerated iHD yesterday UF removed 1.5L  - Nephrology consulted, appreciate recommendations. - Continue phosphate binder calcium  acetate 667mg  TID with meals - Will get 1 u pRBC this morning - Transfusion threshold <8   - VVS consulted, appreciate recommendations (s/o 01/27)  - Follow up in 6 weeks to check maturation of AVF - Continue Tylenol   650 mg q6hrs PRN PO or rectal for pain - Avoid nephrotoxic agents - Strict I&Os, daily weights - Daily RFP COPD (chronic obstructive pulmonary disease) (HCC) On RA, denies SOB. Breathing overall much improved since admission.  - Continue home Breztri  inhaler 2 puffs daily  - Continue DuoNebs BID scheduled and q2h PRN - Benzonatate  200 mg TID PRN for cough Acute on chronic congestive heart failure (HCC) Essential hypertension CAD (coronary artery disease) Stable AM Weight 80.2kg ( 83 kg on admission) - Continue cardiac monitoring  - Cardiology consulted, Appreciated recommendation  - Plan for TEE today    - Continue home Coreg  12.5 mg BID, Hydralazine  100 mg TID, Amlodipine  10 mg Daily, and Doxazosin  2 mg Daily Left vocal cord paralysis CT neck w/ contrast on 01/27 shows medialization of left true vocal cord toward midline ( paralysis vs palsy) w/ intact Vagus n. - ENT consulted, appreciate recommendations Gastroesophageal reflux disease with stricture GE stricture found on Esophagram  - GI consulted, appreciated recommendations Chronic health problem DMII: BCG ACHS, Continue SSI HLD: continue home Pravastatin  40 mg daily Chronic SVT of L small saphenous vein - Not required AC per vascular  FEN/GI: NPO pre-procedure PPx: SQH Dispo:Pending clinical improvement  Subjective:  NAEON. Doing well this morning. On RA denies SOB, WOB, or pain  Objective: Temp:  [97.9 F (36.6 C)-98.7 F (37.1 C)] 98.4 F (36.9 C) (01/28 0856) Pulse Rate:  [76-91] 82 (01/28 0856) Resp:  [15-22] 15 (01/28 0856) BP: (118-149)/(51-72) 118/61 (01/28 0856) SpO2:  [90 %-99 %] 90 % (01/28 0823) Weight:  [78.4 kg-80.2 kg] 80.2 kg (01/28 0313)  Physical Exam Constitutional:  Appearance: She is well-developed.  Pulmonary:     Effort: Pulmonary effort is normal.     Breath sounds: Examination of the right-lower field reveals decreased breath sounds. Examination of the left-lower field reveals  decreased breath sounds. Decreased breath sounds present.  Abdominal:     Palpations: Abdomen is soft.  Neurological:     General: No focal deficit present.     Mental Status: She is alert and oriented to person, place, and time.  Psychiatric:        Mood and Affect: Mood normal.        Behavior: Behavior normal.      Laboratory: Most recent CBC Lab Results  Component Value Date   WBC 12.5 (H) 10/07/2024   HGB 7.2 (L) 10/07/2024   HCT 22.2 (L) 10/07/2024   MCV 81.6 10/07/2024   PLT 349 10/07/2024   Most recent BMP    Latest Ref Rng & Units 10/07/2024    4:04 AM  BMP  Glucose 70 - 99 mg/dL 897   BUN 8 - 23 mg/dL 55   Creatinine 9.55 - 1.00 mg/dL 2.71   Sodium 864 - 854 mmol/L 132   Potassium 3.5 - 5.1 mmol/L 3.7   Chloride 98 - 111 mmol/L 92   CO2 22 - 32 mmol/L 26   Calcium  8.9 - 10.3 mg/dL 8.9     Imaging/Diagnostic Tests: EXAM: CT NECK WITH CONTRAST   IMPRESSION: 1. Medialization of the left true vocal cord towards the midline, which could reflect paresis/palsy. No structural abnormality seen along the course of the vagus nerve to explain vocal cord paralysis. 2. Patchy and dense nodular opacities within the visualized left upper lobe, concerning for pneumonia. Associated small left pleural effusion. Radiographic follow-up to resolution following convalescence recommended to ensure these changes resolve.   Aortic Atherosclerosis (ICD10-I70.0).     Electronically Signed   By: Morene Hoard M.D.   On: 10/07/2024 01:12  EXAM: ESOPHAGUS/BARIUM SWALLOW/TABLET STUDY IMPRESSION: 1. Narrowing at the gastroesophageal junction and the barium tablet was transiently stuck in this area. Findings could be associated with a stricture. 2. Limited evaluation for a hiatal hernia.   Suzen Houston NOVAK, DO 10/07/2024  PGY-1, [facility] FPTS Intern pager: [phone], text pages welcome Secure chat group [facility] Teaching  Service   "

## 2024-10-07 NOTE — Interval H&P Note (Signed)
 History and Physical Interval Note:  10/07/2024 2:37 PM  Catherine Aguilar  has presented today for surgery, with the diagnosis of MRSA bacteremia.  The various methods of treatment have been discussed with the patient and family. After consideration of risks, benefits and other options for treatment, the patient has consented to  Procedures with comments: ECHOCARDIOGRAM, TRANSESOPHAGEAL (N/A) as a surgical intervention.  The patient's history has been reviewed, patient examined, no change in status, stable for surgery.  I have reviewed the patient's chart and labs.  Questions were answered to the patient's satisfaction.     [name] A Tomara Youngberg

## 2024-10-07 NOTE — H&P (Signed)
 "  GASTROENTEROLOGY PROCEDURE H&P NOTE   Primary Care Physician: Adele Song, MD  HPI: Catherine Aguilar is a 69 y.o. female who presents for EGD for dysphagia and abnormal barium swallow to go at same time as TEE planned.  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   CVA (cerebral vascular accident) (HCC) 07/03/2012   Diffuse pulmonary alveolar hemorrhage    Edema of right lower extremity 03/25/2017   Elevated d-dimer 09/26/2024   Essential hypertension 12/08/2016   Hemoptysis 12/08/2016   High cholesterol    HLD (hyperlipidemia) 12/08/2016   Hx of adenomatous polyp of colon 04/16/2017   Hypertension    stopped taking medications 10 years ago   Hypokalemia 07/02/2012   Intra-alveolar hemorrhage 12/08/2016   Left facial numbness 07/02/2012   Sepsis (HCC) 12/08/2016   Shortness of breath 12/02/2017   Stroke (HCC)    Substance abuse (HCC) 12/08/2016   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   Tobacco abuse 12/08/2016   Type 2 diabetes mellitus without complication, with long-term current use of insulin  (HCC) 02/11/2017   Wegener's granulomatosis with renal involvement (HCC) 12/08/2016   Past Surgical History:  Procedure Laterality Date   ABDOMINAL HYSTERECTOMY  1985   INSERTION OF ARTERIOVENOUS (AV) ARTEGRAFT ARM Left 10/05/2024   Procedure: INSERTION OF LEFT ARM RADIOCEPHALIC  ARTERIOVENOUS FISTULA;  Surgeon: Gretta Lonni PARAS, MD;  Location: [facility] OR;  Service: Vascular;  Laterality: Left;   INSERTION OF DIALYSIS CATHETER Right 10/05/2024   Procedure: INSERTION OF RIGHT  INTERNAL JUGULAR DIALYSIS CATHETER;  Surgeon: Gretta Lonni PARAS, MD;  Location: [facility] OR;  Service: Vascular;  Laterality: Right;  TUNNELED   IR TUNNELED CENTRAL VENOUS CATH PLC W IMG  09/26/2024   Current Facility-Administered Medications  Medication Dose Route Frequency Provider Last Rate Last Admin   [MAR Hold] 0.9 %  sodium chloride  infusion (Manually program via Guardrails IV Fluids)   Intravenous Once Majeed, Sara, DO       0.9 %  sodium chloride  infusion  250 mL Intravenous PRN Garrick Leontine SAILOR, PA-C       [MAR Hold] acetaminophen  (TYLENOL ) tablet 650 mg  650 mg Oral Q6H PRN Lupie Credit, DO   650 mg at 10/02/24 2136   Or   [MAR Hold] acetaminophen  (TYLENOL ) suppository 650 mg  650 mg Rectal Q6H PRN Lupie Credit, DO       [MAR Hold] amLODipine  (NORVASC ) tablet 10 mg  10 mg Oral Daily Majeed, Sara, DO   10 mg at 10/07/24 0813   [MAR Hold] benzonatate  (TESSALON ) capsule 200 mg  200 mg Oral TID PRN Hindel, Song, MD   200 mg at 10/06/24 1639   [MAR Hold] budesonide -glycopyrrolate -formoterol  (BREZTRI ) 160-9-4.8 MCG/ACT inhaler 2 puff  2 puff Inhalation Daily Majeed, Sara, DO   2 puff at 10/07/24 0815   [facility] Hold] calcium  acetate (PHOSLO ) capsule 1,334 mg  1,334 mg Oral TID WC Bhandari, Dron Prasad, MD       [facility] Hold] carvedilol  (COREG ) tablet 12.5 mg  12.5 mg Oral BID WC Majeed, Sara, DO   12.5 mg at 10/07/24 9185   [  MAR Hold] Chlorhexidine  Gluconate Cloth 2 % PADS 6 each  6 each Topical Daily Donzetta Rollene BRAVO, MD   6 each at 10/07/24 0816   [MAR Hold] Darbepoetin Alfa  (ARANESP ) injection 60 mcg  60 mcg Subcutaneous Q Mon-1800 Dolan Mateo Larger, MD   60 mcg at 10/05/24 2109   [facility] Hold] diphenhydrAMINE -zinc  acetate (BENADRYL ) 2-0.1 % cream 1 Application  1 Application Topical BID PRN Bhagat, Virali, DO       [MAR Hold] doxazosin  (CARDURA ) tablet 2 mg  2 mg Oral Daily Majeed, Sara, DO   2 mg at 10/06/24 1640   [MAR Hold] heparin  injection 5,000 Units  5,000 Units Subcutaneous Q8H Esterwood,  Amy S, PA-C   5,000 Units at 10/07/24 0536   [MAR Hold] hydrALAZINE  (APRESOLINE ) tablet 100 mg  100 mg Oral Q8H Barbaraann Darryle Ned, MD   100 mg at 10/07/24 0535   [MAR Hold] HYDROcodone -acetaminophen  (NORCO/VICODIN) 5-325 MG per tablet 1-2 tablet  1-2 tablet Oral Q4H PRN Bethanie Cough, PA-C   1 tablet at 10/07/24 0814   [MAR Hold] hydrOXYzine  (ATARAX ) tablet 25 mg  25 mg Oral TID PRN Suzen Elder B, DO   25 mg at 10/06/24 0509   [MAR Hold] insulin  aspart (novoLOG ) injection 0-15 Units  0-15 Units Subcutaneous TID WC Lupie Credit, DO   2 Units at 10/06/24 0654   [MAR Hold] ipratropium-albuterol  (DUONEB) 0.5-2.5 (3) MG/3ML nebulizer solution 3 mL  3 mL Nebulization Q2H PRN Bhagat, Virali, DO   3 mL at 09/29/24 0402   [facility] Hold] Oral care mouth rinse  15 mL Mouth Rinse PRN Donzetta Rollene BRAVO, MD       [facility] Hold] pravastatin  (PRAVACHOL ) tablet 40 mg  40 mg Oral QHS Lupie Credit, DO   40 mg at 10/06/24 2130   sodium chloride  flush (NS) 0.9 % injection 3 mL  3 mL Intravenous Q12H Garrick Leontine SAILOR, PA-C       sodium chloride  flush (NS) 0.9 % injection 3 mL  3 mL Intravenous PRN Garrick Leontine SAILOR, PA-C       [MAR Hold] vancomycin  (VANCOREADY) IVPB 750 mg/150 mL  750 mg Intravenous Q T,Th,Sa-HD Migdalia Feliciano RAMAN, RPH       Current Medications[1] Allergies[2] Family History  Problem Relation Age of Onset   Heart attack Mother    Diabetes Mother    Hypertension Mother    Stroke Mother    Cancer Father    Alcohol abuse Father    Diabetes Father    Hypertension Father    Breast cancer Neg Hx    Colon cancer Neg Hx    Esophageal cancer Neg Hx    Rectal cancer Neg Hx    Stomach cancer Neg Hx    Pancreatic cancer Neg Hx    BRCA 1/2 Neg Hx    Social History   Socioeconomic History   Marital status: Married    Spouse name: Not on file   Number of children: Not on file   Years of education: Not on file   Highest education level: Not on file  Occupational History   Not on file  Tobacco  Use   Smoking status: Former    Current packs/day: 0.00    Average packs/day: 0.2 packs/day for 50.5 years (10.1 ttl pk-yrs)    Types: Cigarettes    Start date: 09/10/1968    Quit date: 03/25/2019    Years since quitting: 5.5   Smokeless tobacco: Never  Vaping Use   Vaping status:  Never Used  Substance and Sexual Activity   Alcohol use: Yes    Comment: on the weekends - moderately per pt   Drug use: Yes    Types: Marijuana    Comment: Friday, Saturday, Sunday   Sexual activity: Not Currently  Other Topics Concern   Not on file  Social History Narrative   Not on file   Social Drivers of Health   Tobacco Use: Medium Risk (10/07/2024)   Patient History    Smoking Tobacco Use: Former    Smokeless Tobacco Use: Never    Passive Exposure: Not on file  Financial Resource Strain: Low Risk (06/11/2024)   Overall Financial Resource Strain (CARDIA)    Difficulty of Paying Living Expenses: Not hard at all  Food Insecurity: No Food Insecurity (09/26/2024)   Epic    Worried About Radiation Protection Practitioner of Food in the Last Year: Never true    Ran Out of Food in the Last Year: Never true  Transportation Needs: No Transportation Needs (09/26/2024)   Epic    Lack of Transportation (Medical): No    Lack of Transportation (Non-Medical): No  Physical Activity: Inactive (06/11/2024)   Exercise Vital Sign    Days of Exercise per Week: 0 days    Minutes of Exercise per Session: 0 min  Stress: No Stress Concern Present (06/11/2024)   [employer] of Occupational Health - Occupational Stress Questionnaire    Feeling of Stress: Not at all  Social Connections: Moderately Integrated (09/26/2024)   Social Connection and Isolation Panel    Frequency of Communication with Friends and Family: Three times a week    Frequency of Social Gatherings with Friends and Family: Three times a week    Attends Religious Services: More than 4 times per year    Active Member of Clubs or Organizations: No    Attends Tax Inspector Meetings: Never    Marital Status: Married  Catering Manager Violence: Not At Risk (09/26/2024)   Epic    Fear of Current or Ex-Partner: No    Emotionally Abused: No    Physically Abused: No    Sexually Abused: No  Depression (PHQ2-9): Low Risk (07/15/2024)   Depression (PHQ2-9)    PHQ-2 Score: 1  Alcohol Screen: Low Risk (05/21/2023)   Alcohol Screen    Last Alcohol Screening Score (AUDIT): 2  Housing: Low Risk (09/26/2024)   Epic    Unable to Pay for Housing in the Last Year: No    Number of Times Moved in the Last Year: 1    Homeless in the Last Year: No  Utilities: Not At Risk (09/26/2024)   Epic    Threatened with loss of utilities: No  Health Literacy: Adequate Health Literacy (06/11/2024)   B1300 Health Literacy    Frequency of need for help with medical instructions: Never    Physical Exam: Today's Vitals   10/07/24 0823 10/07/24 0856 10/07/24 1053 10/07/24 1228  BP:  118/61  122/67  Pulse: 85 82 71 79  Resp:  15  19  Temp: 98.2 F (36.8 C) 98.4 F (36.9 C) 98.6 F (37 C) 97.7 F (36.5 C)  TempSrc: Oral Oral Oral Temporal  SpO2: 90%  95% 92%  Weight:      Height:      PainSc:    0-No pain   Body mass index is 27.69 kg/m. GEN: NAD EYE: Sclerae anicteric ENT: MMM CV: Non-tachycardic GI: Soft, NT/ND NEURO:  Alert & Oriented x 3  Lab  Results: Recent Labs    10/05/24 0122 10/06/24 0330 10/07/24 0404 10/07/24 1120  WBC 11.0* 15.8* 12.5*  --   HGB 7.8* 7.1* 7.2* 8.7*  HCT 23.7* 22.0* 22.2* 26.5*  PLT 272 353 349  --    BMET Recent Labs    10/05/24 0516 10/06/24 0330 10/07/24 0404  NA 133* 132* 132*  K 4.4 4.8 3.7  CL 90* 90* 92*  CO2 17* 18* 26  GLUCOSE 70 135* 102*  BUN 106* 111* 55*  CREATININE 12.00* 12.60* 7.28*  CALCIUM  9.1 9.2 8.9   LFT Recent Labs    10/07/24 0404  ALBUMIN 3.2*   PT/INR No results for input(s): LABPROT, INR in the last 72 hours.   Impression / Plan: This is a 69 y.o.female who presents for  EGD for dysphagia and abnormal barium swallow to go at same time as TEE planned.  The risks and benefits of endoscopic evaluation/treatment were discussed with the patient and/or family; these include but are not limited to the risk of perforation, infection, bleeding, missed lesions, lack of diagnosis, severe illness requiring hospitalization, as well as anesthesia and sedation related illnesses.  The patient's history has been reviewed, patient examined, no change in status, and deemed stable for procedure.  The patient and/or family was provided an opportunity to ask questions and all were answered.  The patient and/or family is agreeable to proceed.    Aloha Finner, MD [facility] Office # [phone]     [1]  Current Facility-Administered Medications:    [MAR Hold] 0.9 %  sodium chloride  infusion (Manually program via Guardrails IV Fluids), , Intravenous, Once, Majeed, Sara, DO   0.9 %  sodium chloride  infusion, 250 mL, Intravenous, PRN, Garrick Leontine SAILOR, PA-C   [MAR Hold] acetaminophen  (TYLENOL ) tablet 650 mg, 650 mg, Oral, Q6H PRN, 650 mg at 10/02/24 2136 **OR** [MAR Hold] acetaminophen  (TYLENOL ) suppository 650 mg, 650 mg, Rectal, Q6H PRN, Majeed, Sara, DO   [MAR Hold] amLODipine  (NORVASC ) tablet 10 mg, 10 mg, Oral, Daily, Majeed, Sara, DO, 10 mg at 10/07/24 0813   [MAR Hold] benzonatate  (TESSALON ) capsule 200 mg, 200 mg, Oral, TID PRN, Hindel, Leah, MD, 200 mg at 10/06/24 1639   [MAR Hold] budesonide -glycopyrrolate -formoterol  (BREZTRI ) 160-9-4.8 MCG/ACT inhaler 2 puff, 2 puff, Inhalation, Daily, Majeed, Sara, DO, 2 puff at 10/07/24 0815   [MAR Hold] calcium  acetate (PHOSLO ) capsule 1,334 mg, 1,334 mg, Oral, TID WC, Bhandari, Dron Prasad, MD   [MAR Hold] carvedilol  (COREG ) tablet 12.5 mg, 12.5 mg, Oral, BID WC, Majeed, Sara, DO, 12.5 mg at 10/07/24 0814   [MAR Hold] Chlorhexidine  Gluconate Cloth 2 % PADS 6 each, 6 each, Topical, Daily, Donzetta Rollene BRAVO, MD, 6 each at 10/07/24 0816   [MAR Hold] Darbepoetin Alfa  (ARANESP ) injection 60 mcg, 60 mcg, Subcutaneous, Q Mon-1800, Dolan Mateo Larger, MD, 60 mcg at 10/05/24 2109   [facility] Hold] diphenhydrAMINE -zinc  acetate (BENADRYL ) 2-0.1 % cream 1 Application, 1 Application, Topical, BID PRN, Bhagat, Virali, DO   [MAR Hold] doxazosin  (CARDURA ) tablet 2 mg, 2 mg, Oral, Daily, Majeed, Sara, DO, 2 mg at 10/06/24 1640   [MAR Hold] heparin  injection 5,000 Units, 5,000 Units, Subcutaneous, Q8H, Esterwood, Amy S, PA-C, 5,000 Units at 10/07/24 0536   [MAR Hold] hydrALAZINE  (APRESOLINE ) tablet 100 mg, 100 mg, Oral, Q8H, O'Neal, Darryle Ned, MD, 100 mg at 10/07/24 0535   [MAR Hold] HYDROcodone -acetaminophen  (NORCO/VICODIN) 5-325 MG per tablet 1-2 tablet, 1-2 tablet, Oral, Q4H PRN, Eveland, Matthew, PA-C, 1 tablet at  10/07/24 0814   [MAR Hold] hydrOXYzine  (ATARAX ) tablet 25 mg, 25 mg, Oral, TID PRN, Suknaim, Kulkaew B, DO, 25 mg at 10/06/24 0509   [MAR Hold] insulin  aspart (novoLOG ) injection 0-15 Units, 0-15 Units, Subcutaneous, TID WC, Majeed, Sara, DO, 2 Units at 10/06/24 0654   [MAR Hold] ipratropium-albuterol  (DUONEB) 0.5-2.5 (3) MG/3ML nebulizer solution 3 mL, 3 mL, Nebulization, Q2H PRN, Bhagat, Virali, DO, 3 mL at 09/29/24 0402   [facility] Hold] Oral care mouth rinse, 15 mL, Mouth Rinse, PRN, Pray, Rollene BRAVO, MD   [facility] Hold] pravastatin  (PRAVACHOL ) tablet 40 mg, 40 mg, Oral, QHS, Majeed, Sara, DO, 40 mg at 10/06/24 2130   sodium chloride  flush (NS) 0.9 % injection 3 mL, 3 mL, Intravenous, Q12H, Lockwood, Logan N, PA-C   sodium chloride  flush (NS) 0.9 % injection 3 mL, 3 mL, Intravenous, PRN, Garrick Leontine SAILOR, PA-C   [MAR Hold] vancomycin  (VANCOREADY) IVPB 750 mg/150 mL, 750 mg, Intravenous, Q T,Th,Sa-HD, Migdalia Feliciano RAMAN, RPH [2] No Known Allergies  "

## 2024-10-07 NOTE — Assessment & Plan Note (Signed)
 On RA, denies SOB. Breathing overall much improved since admission.  - Continue home Breztri  inhaler 2 puffs daily  - Continue DuoNebs BID scheduled and q2h PRN - Benzonatate  200 mg TID PRN for cough

## 2024-10-07 NOTE — Anesthesia Postprocedure Evaluation (Signed)
"   Anesthesia Post Note  Patient: Catherine Aguilar  Procedure(s) Performed: EGD (ESOPHAGOGASTRODUODENOSCOPY) EGD, WITH DILATION USING SAVARY-GILLIARD DILATOR OVER GUIDEWIRE BIOPSY, SKIN, SUBCUTANEOUS TISSUE, OR MUCOUS MEMBRANE ECHOCARDIOGRAM, TRANSESOPHAGEAL     Patient location during evaluation: PACU Anesthesia Type: MAC Level of consciousness: awake and alert Pain management: pain level controlled Vital Signs Assessment: post-procedure vital signs reviewed and stable Respiratory status: spontaneous breathing, nonlabored ventilation, respiratory function stable and patient connected to nasal cannula oxygen Cardiovascular status: stable and blood pressure returned to baseline Postop Assessment: no apparent nausea or vomiting Anesthetic complications: no   No notable events documented.  Last Vitals:  Vitals:   10/07/24 1600 10/07/24 1618  BP: (!) 121/51 (!) 143/68  Pulse: 71 80  Resp: 14 17  Temp:  36.5 C  SpO2: 91% 95%    Last Pain:  Vitals:   10/07/24 1618  TempSrc: Oral  PainSc: 0-No pain   Pain Goal: Patients Stated Pain Goal: 6 (10/05/24 1245)                 Catherine Aguilar      "

## 2024-10-08 ENCOUNTER — Encounter (HOSPITAL_COMMUNITY): Payer: Self-pay | Admitting: Gastroenterology

## 2024-10-08 ENCOUNTER — Other Ambulatory Visit (HOSPITAL_COMMUNITY): Payer: Self-pay

## 2024-10-08 DIAGNOSIS — B9562 Methicillin resistant Staphylococcus aureus infection as the cause of diseases classified elsewhere: Secondary | ICD-10-CM | POA: Diagnosis not present

## 2024-10-08 DIAGNOSIS — R7881 Bacteremia: Secondary | ICD-10-CM | POA: Diagnosis not present

## 2024-10-08 LAB — RENAL FUNCTION PANEL
Albumin: 3.2 g/dL — ABNORMAL LOW (ref 3.5–5.0)
Anion gap: 16 — ABNORMAL HIGH (ref 5–15)
BUN: 66 mg/dL — ABNORMAL HIGH (ref 8–23)
CO2: 24 mmol/L (ref 22–32)
Calcium: 9.2 mg/dL (ref 8.9–10.3)
Chloride: 93 mmol/L — ABNORMAL LOW (ref 98–111)
Creatinine, Ser: 8.86 mg/dL — ABNORMAL HIGH (ref 0.44–1.00)
GFR, Estimated: 4 mL/min — ABNORMAL LOW
Glucose, Bld: 90 mg/dL (ref 70–99)
Phosphorus: 7.3 mg/dL — ABNORMAL HIGH (ref 2.5–4.6)
Potassium: 4.3 mmol/L (ref 3.5–5.1)
Sodium: 133 mmol/L — ABNORMAL LOW (ref 135–145)

## 2024-10-08 LAB — BPAM RBC
Blood Product Expiration Date: 202602012359
ISSUE DATE / TIME: 202601280833
Unit Type and Rh: 202602012359
Unit Type and Rh: 6200

## 2024-10-08 LAB — CBC
HCT: 26.4 % — ABNORMAL LOW (ref 36.0–46.0)
Hemoglobin: 8.7 g/dL — ABNORMAL LOW (ref 12.0–15.0)
MCH: 27.1 pg (ref 26.0–34.0)
MCHC: 33 g/dL (ref 30.0–36.0)
MCV: 82.2 fL (ref 80.0–100.0)
Platelets: 374 10*3/uL (ref 150–400)
RBC: 3.21 MIL/uL — ABNORMAL LOW (ref 3.87–5.11)
RDW: 16.4 % — ABNORMAL HIGH (ref 11.5–15.5)
WBC: 17.5 10*3/uL — ABNORMAL HIGH (ref 4.0–10.5)
nRBC: 0 % (ref 0.0–0.2)

## 2024-10-08 LAB — GLUCOSE, CAPILLARY
Glucose-Capillary: 88 mg/dL (ref 70–99)
Glucose-Capillary: 83 mg/dL (ref 70–99)
Glucose-Capillary: 186 mg/dL — ABNORMAL HIGH (ref 70–99)

## 2024-10-08 LAB — TYPE AND SCREEN
ABO/RH(D): A POS
Antibody Screen: NEGATIVE
Unit division: 0

## 2024-10-08 MED ORDER — DOXAZOSIN MESYLATE 2 MG PO TABS
2.0000 mg | ORAL_TABLET | Freq: Every day | ORAL | 0 refills | Status: AC
  Filled 2024-10-08: qty 30, 30d supply, fill #0

## 2024-10-08 MED ORDER — CARVEDILOL 12.5 MG PO TABS
12.5000 mg | ORAL_TABLET | Freq: Two times a day (BID) | ORAL | 0 refills | Status: AC
  Filled 2024-10-08: qty 30, 15d supply, fill #0

## 2024-10-08 MED ORDER — HEPARIN SODIUM (PORCINE) 1000 UNIT/ML DIALYSIS
1000.0000 [IU] | INTRAMUSCULAR | Status: DC | PRN
  Filled 2024-10-08: qty 1

## 2024-10-08 MED ORDER — ALTEPLASE 2 MG IJ SOLR
2.0000 mg | Freq: Once | INTRAMUSCULAR | Status: DC | PRN
  Filled 2024-10-08: qty 2

## 2024-10-08 MED ORDER — PANTOPRAZOLE SODIUM 40 MG PO TBEC
40.0000 mg | DELAYED_RELEASE_TABLET | Freq: Two times a day (BID) | ORAL | 1 refills | Status: AC
  Filled 2024-10-08: qty 60, 30d supply, fill #0

## 2024-10-08 MED ORDER — HEPARIN SODIUM (PORCINE) 1000 UNIT/ML IJ SOLN
INTRAMUSCULAR | Status: AC
  Filled 2024-10-08: qty 4

## 2024-10-08 MED ORDER — VANCOMYCIN HCL 750 MG/150ML IV SOLN
INTRAVENOUS | Status: AC
  Filled 2024-10-08: qty 150

## 2024-10-08 MED ORDER — INSULIN ASPART 100 UNIT/ML IJ SOLN: 0.0000 [IU] | Freq: Every day | INTRAMUSCULAR | Status: DC

## 2024-10-08 MED ORDER — HYDRALAZINE HCL 100 MG PO TABS
100.0000 mg | ORAL_TABLET | Freq: Three times a day (TID) | ORAL | 0 refills | Status: AC
  Filled 2024-10-08: qty 30, 10d supply, fill #0

## 2024-10-08 MED ORDER — INSULIN ASPART 100 UNIT/ML IJ SOLN
0.0000 [IU] | Freq: Three times a day (TID) | INTRAMUSCULAR | Status: DC
  Administered 2024-10-08: 2 [IU] via SUBCUTANEOUS
  Filled 2024-10-08: qty 2

## 2024-10-08 MED ORDER — CALCIUM ACETATE (PHOS BINDER) 667 MG PO CAPS
1334.0000 mg | ORAL_CAPSULE | Freq: Three times a day (TID) | ORAL | 0 refills | Status: AC
  Filled 2024-10-08: qty 180, 30d supply, fill #0

## 2024-10-08 NOTE — Progress Notes (Signed)
 PT Cancellation Note  Patient Details Name: Catherine Aguilar MRN: [id number] DOB: 1956/01/25   Cancelled Treatment:    Reason Eval/Treat Not Completed: (P) Patient at procedure or test/unavailable. Pt at HD. Will plan to follow-up later as time permits.   Catherine Aguilar, PT, DPT Acute Rehabilitation Services  Office: [phone]    Catherine Aguilar 10/08/2024, 8:58 AM

## 2024-10-08 NOTE — Assessment & Plan Note (Signed)
 Blood Cx on  1/23 w/ no growth. Elevated WBC from 12.5 yesterday to 17.5 this MA - Continue contact isolation (+ MRSA)  - ID consulted, appreciate recommendations - Continue vancomycin  for 4 weeks (1/19 - 2/18 ) - Completed PO Augmentin  250-125 mg q12hrs (1/20-1/24) for PNA - Daily CBC

## 2024-10-08 NOTE — Assessment & Plan Note (Signed)
 CT neck w/ contrast on 01/27 shows medialization of left true vocal cord toward midline ( paralysis vs palsy) w/ intact Vagus n. - ENT consulted, appreciate recommendations  - No intervention at this time - Continue to monitoring symptoms if continues with airway issues then she will need a trach.  - May need medialization if continue w/ hoarseness - ENT will f/u outpatient

## 2024-10-08 NOTE — Progress Notes (Signed)
 Patient is alert and oriented, ready for d/c, discharge instructions provided to the patient, she is aware regarding TOC medications and dialysis schedule.  TOC medication received from pharmacy and provided to the patient, PIV removed, CCMD notified, she has taken all the belongings with her, had no any concerns during d/c.

## 2024-10-08 NOTE — Assessment & Plan Note (Signed)
 Received blood transfusion yesterday. HgB 8.7 this morning, last dose ESA on 01/25. - Nephrology consulted, appreciate recommendations.  - T/Th/Sat Dialysis - Continue phosphate binder calcium  acetate 667mg  TID with meals - Transfusion threshold <8   - VVS consulted, appreciate recommendations (s/o 01/27)  - Follow up in 6 weeks to check maturation of AVF - Continue Tylenol  650 mg q6hrs PRN PO or rectal for pain - Avoid nephrotoxic agents - Strict I&Os, daily weights - Daily RFP and CBC - PT/OT consulted

## 2024-10-08 NOTE — Assessment & Plan Note (Signed)
 Stable AM Weight 78.2 ( 83 kg on admission) TEE on 1/28 LVEF 70-75% - hyperdynamic fx  w/o vegetation  - Continue cardiac monitoring  - Cardiology consulted, Appreciated recommendation  - Continue home Coreg  12.5 mg BID, Hydralazine  100 mg TID, Amlodipine  10 mg Daily, and Doxazosin  2 mg Daily

## 2024-10-08 NOTE — Assessment & Plan Note (Signed)
 GE stricture found on Esophagram, SLP w/ barium swallow recommended Regular diet w/ thin - GI consulted, appreciated recommendations  - S/p EGD w/ 16 mm dilation  - Continue Protonix  40 mg daily  - Soft diet w/ ADAT with goal of regular diet

## 2024-10-08 NOTE — Progress Notes (Addendum)
 Contacted [facility] to inquire if it would be possible for pt to start on Saturday if pt stable for d/c by then. Will await clinic response. Contacted by pharmacy today to be made aware that pt will need iv abx with HD at d/c. Renal PA made aware of this info and will provide orders at d/c. Will assist as needed.   Randine Mungo Dialysis Navigator [phone]  Addendum at 11:14 am: Clinic reports that due to impending bad weather on Saturday, clinic is only running a 1st shift and pt would need to start on Tuesday. This info was provided to nephrologist.

## 2024-10-08 NOTE — Evaluation (Signed)
 Physical Therapy Evaluation Patient Details Name: Catherine Aguilar MRN: [id number] DOB: 1956-09-08 Today's Date: 10/08/2024  History of Present Illness  Pt is a 69 y.o. yo female who was initially admitted 1/14 for CHF exacerbation, however her hospitalization has been c/b worsening kidney fx required iHD, iHD line infection result in MRSA bacteremia, and left vocal cord paralysis work up shows GE stricture required EGD and Dilation w/ GI. PMHx: of DMII, HTN, ERSD, Anemia of Chronic Disease, COPD, and  Wegener's granulomatosis with renal involvement.   Clinical Impression  Pt presents with condition above and deficits mentioned below, see PT Problem List. PTA, she was independent without DME, living with her spouse in a 1-level house with a level entry. Currently, the pt appears frustrated, often either avoiding answering questions or giving brief one-word answers. She was a bit impulsive to walk quickly to complete the session. She ambulates with an antalgic gait pattern due to pain throughout the entire legs bil, R>L. Pt would not elaborate further other than her leg pain is chronic but worse today than usual. Her pain is impacting her standing mobility balance and safety, resulting in her currently benefiting from a RW. She declined any offer of OP vs HH PT to address her leg pain at d/c. Will continue to follow acutely to address her deficits and maximize her return to baseline prior to d/c home.      If plan is discharge home, recommend the following: Assistance with cooking/housework;Assist for transportation   Can travel by private vehicle        Equipment Recommendations None recommended by PT  Recommendations for Other Services       Functional Status Assessment Patient has had a recent decline in their functional status and demonstrates the ability to make significant improvements in function in a reasonable and predictable amount of time.     Precautions / Restrictions  Precautions Precautions: Fall Precaution/Restrictions Comments: impulsive Restrictions Weight Bearing Restrictions Per Provider Order: No      Mobility  Bed Mobility Overal bed mobility: Modified Independent             General bed mobility comments: HOB elevated, pt exiting and entering L EOB without assistance    Transfers Overall transfer level: Modified independent Equipment used: Rolling walker (2 wheels)               General transfer comment: Pt stood from EOB to RW without LOB or assistance    Ambulation/Gait Ambulation/Gait assistance: Contact guard assist, Supervision Gait Distance (Feet): 100 Feet Assistive device: Rolling walker (2 wheels) Gait Pattern/deviations: Step-through pattern, Decreased stance time - right, Decreased weight shift to right, Antalgic, Trunk flexed Gait velocity: WFL     General Gait Details: Pt ambulates with a flexed posture and an antalgic gait pattern, displaying reduced R weight shift and stance time due to more pain in R than L leg. No LOB, pt impulsive to speed up to finish gait bout, CGA fading to supervision for safety  Stairs            Wheelchair Mobility     Tilt Bed    Modified Rankin (Stroke Patients Only)       Balance Overall balance assessment: Mild deficits observed, not formally tested  Pertinent Vitals/Pain Pain Assessment Pain Assessment: Faces Faces Pain Scale: Hurts little more Pain Location: Bil legs, R>L (entire leg, not one specific area per pt) Pain Descriptors / Indicators: Grimacing, Guarding, Sore Pain Intervention(s): Monitored during session, Limited activity within patient's tolerance, Repositioned    Home Living Family/patient expects to be discharged to:: Private residence Living Arrangements: Spouse/significant other Available Help at Discharge: Family;Available 24 hours/day Type of Home: House Home Access:  Level entry       Home Layout: One level Home Equipment: Agricultural Consultant (2 wheels);Cane - single point;BSC/3in1      Prior Function Prior Level of Function : Independent/Modified Independent             Mobility Comments: indep, no AD ADLs Comments: indep, does not drive     Extremity/Trunk Assessment   Upper Extremity Assessment Upper Extremity Assessment: Defer to OT evaluation    Lower Extremity Assessment Lower Extremity Assessment: RLE deficits/detail;LLE deficits/detail RLE Deficits / Details: MMT scores of 4+ quads, 4 anterior tibialis; denied numbness/tingling bil; WFL ROM throughout LLE Deficits / Details: MMT scores of 4+ quads, 4 anterior tibialis; denied numbness/tingling bil; WFL ROM throughout    Cervical / Trunk Assessment Cervical / Trunk Assessment: Normal  Communication   Communication Communication: No apparent difficulties    Cognition Arousal: Alert Behavior During Therapy: Flat affect, Impulsive   PT - Cognitive impairments: Difficult to assess Difficult to assess due to:  (pt limiting communication due to evident frustration)                     PT - Cognition Comments: Pt evidently frustrated and annoyed, only giving short answers and limiting communication with therapist. Attempted to facilitate discussion and provide active listening to identify the issue and try to provide solutions, but pt either avoided giving a direct answer or just stated I just want to go home. Thus, difficult to assess cognition. She can be impulsive to move quickly with poor safety awareness, but suspect this may be due to her mood and she may be at her baseline Following commands: Impaired Following commands impaired: Follows one step commands with increased time, Follows multi-step commands inconsistently     Cueing Cueing Techniques: Verbal cues     General Comments General comments (skin integrity, edema, etc.): Encouraged pt to use her RW at d/c to  improve her balance and standing tolerance considering her leg pain. She verbalized understanding. Educated pt on benefits of PT to manage leg pain but she declined post acute PT    Exercises     Assessment/Plan    PT Assessment Patient needs continued PT services  PT Problem List Decreased strength;Decreased activity tolerance;Decreased balance;Decreased mobility;Decreased safety awareness;Pain       PT Treatment Interventions DME instruction;Gait training;Functional mobility training;Therapeutic activities;Therapeutic exercise;Balance training;Neuromuscular re-education;Patient/family education    PT Goals (Current goals can be found in the Care Plan section)  Acute Rehab PT Goals Patient Stated Goal: to go home PT Goal Formulation: With patient Time For Goal Achievement: 10/22/24 Potential to Achieve Goals: Good    Frequency Min 1X/week     Co-evaluation               AM-PAC PT 6 Clicks Mobility  Outcome Measure Help needed turning from your back to your side while in a flat bed without using bedrails?: None Help needed moving from lying on your back to sitting on the side of a flat bed without using bedrails?: None Help needed  moving to and from a bed to a chair (including a wheelchair)?: None Help needed standing up from a chair using your arms (e.g., wheelchair or bedside chair)?: None Help needed to walk in hospital room?: A Little Help needed climbing 3-5 steps with a railing? : A Little 6 Click Score: 22    End of Session   Activity Tolerance: Patient limited by pain;Patient limited by fatigue Patient left: in bed;with call bell/phone within reach;with bed alarm set Nurse Communication: Mobility status PT Visit Diagnosis: Unsteadiness on feet (R26.81);Other abnormalities of gait and mobility (R26.89);Muscle weakness (generalized) (M62.81);Difficulty in walking, not elsewhere classified (R26.2);Pain Pain - Right/Left:  (bil) Pain - part of body: Leg     Time: 8595-8584 PT Time Calculation (min) (ACUTE ONLY): 11 min   Charges:   PT Evaluation $PT Eval Moderate Complexity: 1 Mod   PT General Charges $$ ACUTE PT VISIT: 1 Visit         Theo Ferretti, PT, DPT Acute Rehabilitation Services  Office: [phone]   Theo CHRISTELLA Ferretti 10/08/2024, 4:57 PM

## 2024-10-08 NOTE — Progress Notes (Signed)
 OT Cancellation Note  Patient Details Name: Catherine Aguilar MRN: [id number] DOB: [birth year]   Cancelled Treatment:    Reason Eval/Treat Not Completed: Patient at procedure or test/ unavailable (HD)  Catherine Aguilar 10/08/2024, 8:44 AM

## 2024-10-08 NOTE — Plan of Care (Signed)
" °  Problem: Fluid Volume: Goal: Ability to maintain a balanced intake and output will improve Outcome: Progressing   Problem: Metabolic: Goal: Ability to maintain appropriate glucose levels will improve Outcome: Progressing   Problem: Nutritional: Goal: Maintenance of adequate nutrition will improve Outcome: Progressing Goal: Progress toward achieving an optimal weight will improve Outcome: Progressing   Problem: Activity: Goal: Activity intolerance will improve Outcome: Progressing   "

## 2024-10-08 NOTE — Assessment & Plan Note (Addendum)
 DMII: BCG ACHS, Continue SSI  HLD: continue home Pravastatin  40 mg daily Chronic SVT of L small saphenous vein - Not required AC per vascular

## 2024-10-08 NOTE — Progress Notes (Incomplete)
 Initial Nutrition Assessment  DOCUMENTATION CODES:      INTERVENTION:  ***   NUTRITION DIAGNOSIS:     related to   as evidenced by  .  ***  GOAL:      ***  MONITOR:      REASON FOR ASSESSMENT:        ASSESSMENT:     PMH: COPD, CKD, Wegener's granulomatosis, anemia, T2DM,  Admitted with: Presented with: SOB, volume overload  1/16- admitted 1/17- amenable to start HD RIJ placed 1/18- began HD, volume status is improving 1/19- repeat HD 1/20- repeat HD  1/21- temp cath removed. MRSA bacteremia  1/26- TDC RIJ, Left arm AV fistula  1.27- HD tolerating well  Vocal cord dysfunction    Admit weight: 83 kg Current weight: 77 kg  EDW: Last Hd Tx:  UF:   I&O:     Average Meal Intake: 1/26-1/29: 28% intake x 5 recorded meals  Nutritionally Relevant Medications: Scheduled Meds:  calcium  acetate  1,334 mg Oral TID WC   darbepoetin (ARANESP ) injection - DIALYSIS  60 mcg Subcutaneous Q Mon-1800   insulin  aspart  0-5 Units Subcutaneous QHS   insulin  aspart  0-9 Units Subcutaneous TID WC   pantoprazole   40 mg Oral BID   pravastatin   40 mg Oral QHS   Continuous Infusions:  vancomycin  Stopped (10/08/24 1341)   PRN Meds: diphenhydrAMINE -zinc  acetate   Labs Reviewed: Na: 133 BUN: 66 Creatinine: 8.86 Phosphorus: 7.3 GFR: 4  CBG ranges from 93-128 mg/dL over the last 24 hours HgbA1c 5.7    NUTRITION - FOCUSED PHYSICAL EXAM:  {RD Focused Exam List:21252}  Diet Order:   Diet Order             DIET SOFT Room service appropriate? Yes; Fluid consistency: Thin  Diet effective now                   EDUCATION NEEDS:      Skin:     Last BM:     Height:   Ht Readings from Last 1 Encounters:  09/25/24 5' 7 (1.702 m)    Weight:   Wt Readings from Last 1 Encounters:  10/08/24 77 kg    Ideal Body Weight:     BMI:  Body mass index is 26.59 kg/m.  Estimated Nutritional Needs:   Kcal:     Protein:     Fluid:       ***

## 2024-10-08 NOTE — Progress Notes (Signed)
 [facility] NEPHROLOGY PROGRESS NOTE  Assessment/ Plan: Pt is a 69 y.o. yo female   # End-stage renal disease: From progressive underlying chronic kidney disease secondary to hypertension, type 2 diabetes mellitus and distant history of ANCA vasculitis.  Started on hemodialysis on 09/26/2024 via temporary right dialysis catheter dialysis catheter and had her third dialysis treatment on 1/20.  Her temporary dialysis catheter was removed on 1/21 and she remains on a catheter holiday.  - -Status post right IJ [facility] and a left radiocephalic AV fistula creation by Dr. Gretta on 1/26. -Pt has been accepted at [facility] on TTS 11:30 am chair time. Pt will need to arrive at 10:45 am for first appt to complete paperwork prior to treatment.  - Dialysis today and then ok to discharge from renal perspective.  Patient may not be able to start OP HD on Saturday because of bad weather, ok to wait till Tuesday.  Discussed with renal navigator.  Recommend fluid restriction.  # Hypertension: Blood pressure and volume status acceptable, UF with HD.  Monitor BP.  # Anemia: Continue ESA, monitor hemoglobin.  No overt blood loss noted.  # Secondary hyperparathyroidism/hyperphosphatemia: Calcium  level acceptable, continue binders for phosphorus.  Renal diet.   # MRSA bacteremia secondary to temporary HD line which was removed, currently on vancomycin  per ID.  TEE with no vegetation.  # Vocal cord paralysis: Patient had a fiberoptic laryngoscopy by ENT on 1/21, underwent EGD with dilatation on 1/28 by GI.  Subjective: Seen and examined.  Denies nausea, vomiting, chest pain, shortness of breath.  Dialysis today.  Objective Vital signs in last 24 hours: Vitals:   10/08/24 1013 10/08/24 1028 10/08/24 1058 10/08/24 1113  BP: 136/61 (!) 133/56 128/66 103/82  Pulse: 93 86 82 97  Resp: 19 13 15 19   Temp:      TempSrc:      SpO2: 93% 92% 100% 94%  Weight:      Height:       Weight change: -2  kg  Intake/Output Summary (Last 24 hours) at 10/08/2024 1126 Last data filed at 10/07/2024 2015 Gross per 24 hour  Intake 744.22 ml  Output 0 ml  Net 744.22 ml       Labs: RENAL PANEL Recent Labs  Lab 10/04/24 0315 10/05/24 0516 10/06/24 0330 10/07/24 0404 10/08/24 0649  NA 130* 133* 132* 132* 133*  K 3.8 4.4 4.8 3.7 4.3  CL 89* 90* 90* 92* 93*  CO2 19* 17* 18* 26 24  GLUCOSE 89 70 135* 102* 90  BUN 96* 106* 111* 55* 66*  CREATININE 10.90* 12.00* 12.60* 7.28* 8.86*  CALCIUM  8.7* 9.1 9.2 8.9 9.2  PHOS 7.7* 9.2* 8.7* 5.4* 7.3*  ALBUMIN 3.1* 3.2* 3.3* 3.2* 3.2*    Liver Function Tests: Recent Labs  Lab 10/06/24 0330 10/07/24 0404 10/08/24 0649  ALBUMIN 3.3* 3.2* 3.2*   No results for input(s): LIPASE, AMYLASE in the last 168 hours. No results for input(s): AMMONIA in the last 168 hours. CBC: Recent Labs    01/15/24 1319 01/15/24 1320 05/27/24 1434 06/10/24 1159 06/24/24 1150 06/24/24 1154 07/22/24 1229 07/22/24 1235 09/23/24 1243 09/23/24 1312 09/26/24 0343 09/27/24 0401 10/05/24 0122 10/06/24 0330 10/07/24 0404 10/07/24 1120 10/08/24 0649  HGB  --    < >  --    < >  --    < >  --    < >  --    < > 7.3*   < > 7.8*  7.1* 7.2* 8.7* 8.7*  MCV  --    < >  --   --   --    < >  --    < >  --    < > 81.7   < > 79.0* 81.2 81.6  --  82.2  VITAMINB12 505  --   --   --   --   --   --   --   --   --   --   --   --   --   --   --   --   FOLATE 5.9*  --   --   --   --   --   --   --   --   --   --   --   --   --   --   --   --   FERRITIN  --    < > 273  --  253  --  339*  --  737*  --  694*  --   --   --   --   --   --   TIBC  --    < > 259  --  281  --  277  --  231*  --  216*  --   --   --   --   --   --   IRON   --    < > 103  --  57  --  61  --  98  --  56  --   --   --   --   --   --   RETICCTPCT 1.9  --   --   --   --   --   --   --   --   --  2.4  --   --   --   --   --   --    < > = values in this interval not displayed.    Cardiac Enzymes: No  results for input(s): CKTOTAL, CKMB, CKMBINDEX, TROPONINI in the last 168 hours. CBG: Recent Labs  Lab 10/07/24 0603 10/07/24 1100 10/07/24 1701 10/07/24 2100 10/08/24 0600  GLUCAP 104* 93 94 128* 88    Iron  Studies: No results for input(s): IRON , TIBC, TRANSFERRIN, FERRITIN in the last 72 hours. Studies/Results: ECHO TEE Result Date: 10/07/2024    TRANSESOPHOGEAL ECHO REPORT   Patient Name:   Catherine Aguilar Date of Exam: 10/07/2024 Medical Rec #:  [id number]       Height:       67.0 in Accession #:    [id number]      Weight:       176.8 lb Date of Birth:  Apr 02, 1956       BSA:          1.919 m Patient Age:    68 years        BP:           131/56 mmHg Patient Gender: F               HR:           89 bpm. Exam Location:  Inpatient Procedure: 3D Echo, Transesophageal Echo, Cardiac Doppler and Color Doppler            (Both Spectral and Color Flow Doppler were utilized during            procedure). Indications:  Subacute bacterial endocarditis;  History:         Patient has prior history of Echocardiogram examinations, most                  recent 09/27/2024. CAD, COPD, Signs/Symptoms:Bacteremia, Dyspnea                  and Shortness of Breath; Risk Factors:Hypertension, Diabetes                  and Dyslipidemia. ESRD.  Sonographer:     Ellouise Mose RDCS Referring Phys:  [id number] LEONTINE Aguilar LOCKWOOD Diagnosing Phys: Soyla Merck MD PROCEDURE: After discussion of the risks and benefits of a TEE, an informed consent was obtained from the patient. The transesophogeal probe was passed without difficulty through the esophogus of the patient. Imaged were obtained with the patient in a left lateral decubitus position. Sedation performed by different physician. The patient was monitored while under deep sedation. Anesthestetic sedation was provided intravenously by Anesthesiology: 610mg  of Propofol . Image quality was good. Mini TEE probe used for patient safety. The patient's vital signs;  including heart rate, blood pressure, and oxygen saturation; remained stable throughout the procedure. The patient developed no complications during the procedure. Transgastric views not obtained: patient safety - known stricture pending balloon dilation after TEE.  IMPRESSIONS  1. The mitral annulus is calcified, and there is a rounded calcified mass on the atrial aspect of the mitral valve. It is not independently mobile, it is echogenic, and there is only trivial MR. This finding most likely represents mobile bulky MAC. The mitral valve is degenerative with small strands and no definite vegetation. The mitral valve is degenerative. Trivial mitral valve regurgitation. No evidence of mitral stenosis. Severe mitral annular calcification.  2. Left ventricular ejection fraction, by estimation, is 70 to 75%. The left ventricle has hyperdynamic function.  3. Right ventricular systolic function is normal. The right ventricular size is normal.  4. Left atrial size was moderately dilated. No left atrial/left atrial appendage thrombus was detected. The LAA emptying velocity was 76 cm/s.  5. A venous catheter is visualized in the RA with no evidence of vegetation.  6. The aortic valve is tricuspid. Aortic valve regurgitation is not visualized. Aortic valve sclerosis is present, with no evidence of aortic valve stenosis.  7. There is Moderate (Grade III) plaque involving the aortic arch and descending aorta.  8. 3D performed of the mitral valve and demonstrates abnormal MV with mobile MAC. Conclusion(s)/Recommendation(s): No evidence of vegetation/infective endocarditis on this transesophageael echocardiogram. FINDINGS  Left Ventricle: Left ventricular ejection fraction, by estimation, is 70 to 75%. The left ventricle has hyperdynamic function. The left ventricular internal cavity size was normal in size. Right Ventricle: The right ventricular size is normal. No increase in right ventricular wall thickness. Right ventricular  systolic function is normal. Left Atrium: Left atrial size was moderately dilated. No left atrial/left atrial appendage thrombus was detected. The LAA emptying velocity was 76 cm/s. Right Atrium: Right atrial size was normal in size. A venous catheter is visualized in the RA with no evidence of vegetation. Pericardium: Trivial pericardial effusion is present. Mitral Valve: The mitral annulus is calcified, and there is a rounded calcified mass on the atrial aspect of the mitral valve. It is not independently mobile, it is echogenic, and there is only trivial MR. This finding most likely represents mobile bulky  MAC. The mitral valve is degenerative with small strands and no definite vegetation. The mitral  valve is degenerative in appearance. There is severe calcification of the anterior mitral valve leaflet(s). Severe mitral annular calcification. Trivial mitral valve regurgitation. No evidence of mitral valve stenosis. Tricuspid Valve: The tricuspid valve is normal in structure. Tricuspid valve regurgitation is trivial. No evidence of tricuspid stenosis. Aortic Valve: The aortic valve is tricuspid. Aortic valve regurgitation is not visualized. Aortic valve sclerosis is present, with no evidence of aortic valve stenosis. Pulmonic Valve: The pulmonic valve was normal in structure. Pulmonic valve regurgitation is trivial. No evidence of pulmonic stenosis. Aorta: The aortic root and ascending aorta are structurally normal, with no evidence of dilitation. There is moderate (Grade III) plaque involving the aortic arch and descending aorta. Venous: The left upper pulmonary vein is normal. IAS/Shunts: No atrial level shunt detected by color flow Doppler. Additional Comments: 3D was performed not requiring image post processing on an independent workstation and was abnormal. A venous catheter is visualized in the right atrium and RA with no evidence of vegetation. Spectral Doppler performed.  AORTA Ao Root diam: 3.10 cm Ao  Asc diam:  3.27 cm Soyla Merck MD Electronically signed by Soyla Merck MD Signature Date/Time: 10/07/2024/4:56:48 PM    Final    CT SOFT TISSUE NECK W CONTRAST Result Date: 10/07/2024 CLINICAL DATA:  Initial evaluation for unilateral vocal cord paralysis, laterality not specified. EXAM: CT NECK WITH CONTRAST TECHNIQUE: Multidetector CT imaging of the neck was performed using the standard protocol following the bolus administration of intravenous contrast. RADIATION DOSE REDUCTION: This exam was performed according to the departmental dose-optimization program which includes automated exposure control, adjustment of the mA and/or kV according to patient size and/or use of iterative reconstruction technique. CONTRAST:  75mL OMNIPAQUE  IOHEXOL  350 MG/ML SOLN COMPARISON:  None Available. FINDINGS: Pharynx and larynx: Oral cavity within normal limits. Oropharynx and nasopharynx unremarkable without abnormality. No retropharyngeal collection or swelling. Negative epiglottis. Hypopharynx and supraglottic larynx within normal limits. Left true vocal cord appears medialized towards the midline, which could reflect paresis/palsy (series 2, image 77). Mild narrowing of the glottic airway which measures 4-5 mm in transverse diameter at its most narrow point. Subglottic airway clear. No structure abnormality seen along the course of either vagus nerve to explain vocal cord paralysis. Salivary glands: Salivary glands including the parotid and submandibular glands are within normal limits. Thyroid : 5 mm left thyroid  nodule noted, of doubtful significance given size and patient age, no follow-up imaging recommended (ref: J Am Coll Radiol. 2015 Feb;12(2): 143-50). Lymph nodes: No enlarged or pathologic lymph nodes within the neck. Vascular: Normal intravascular enhancement seen within the neck. Moderate atheromatous change about the aortic arch, carotid bulbs, and carotid siphons. Right IJ approach central venous catheter  in place. Limited intracranial: No acute finding. Visualized orbits: Prior ocular lens replacement on the left. No other acute finding. Mastoids and visualized paranasal sinuses: Small volume pneumatized secretions noted within the left ethmoidal air cells. Paranasal sinuses are otherwise largely clear. Mastoid air cells and middle ear cavities are well pneumatized and free of fluid. Skeleton: No worrisome osseous lesions. Moderate spondylosis present at C5-6. Degenerative osteoarthritic changes noted about the right TMJ. Remote right-sided rib fracture noted. Upper chest: Patchy and dense nodular opacities within the visualized left upper lobe, again concerning for pneumonia. Associated small left pleural effusion. Cardiomegaly with coronary artery calcifications noted. Other: None. IMPRESSION: 1. Medialization of the left true vocal cord towards the midline, which could reflect paresis/palsy. No structural abnormality seen along the course of the vagus nerve to  explain vocal cord paralysis. 2. Patchy and dense nodular opacities within the visualized left upper lobe, concerning for pneumonia. Associated small left pleural effusion. Radiographic follow-up to resolution following convalescence recommended to ensure these changes resolve. Aortic Atherosclerosis (ICD10-I70.0). Electronically Signed   By: Morene Hoard M.D.   On: 10/07/2024 01:12   DG ESOPHAGUS W SINGLE CM (SOL OR THIN BA) Result Date: 10/06/2024 CLINICAL DATA:  69 year old female. Endorsing dysphagia. Request is for single esophagram for further evaluation EXAM: ESOPHAGUS/BARIUM SWALLOW/TABLET STUDY TECHNIQUE: Single contrast examination was performed using thin liquid barium. This exam was performed by Delon Beagle NP and was supervised and interpreted by Dr. Juliene Balder. FLUOROSCOPY: Radiation Exposure Index (as provided by the fluoroscopic device): 18.6 mGy Kerma COMPARISON:  None Available. FINDINGS: Swallowing: Appears normal. No  vestibular penetration or aspiration seen. Pharynx: Unremarkable. Esophagus: Persistent narrowing at the GE junction. Dilatation of the distal esophageal vestibule. Esophageal motility: Within normal limits. Hiatal Hernia: Limited evaluation because the patient could not be placed prone or obtain a RAO projection. Gastroesophageal reflux: None visualized. Ingested 13mm barium tablet: Temporary stasis proximal to GE junction. Eventually passing with the aid of thin barium Other: None. IMPRESSION: 1. Narrowing at the gastroesophageal junction and the barium tablet was transiently stuck in this area. Findings could be associated with a stricture. 2. Limited evaluation for a hiatal hernia. Electronically Signed   By: Juliene Balder M.D.   On: 10/06/2024 16:13   DG Swallowing Func-Speech Pathology Result Date: 10/06/2024 Table formatting from the original result was not included. Modified Barium Swallow Study Patient Details Name: Catherine Aguilar MRN: [id number] Date of Birth: December 03, 1955 Today's Date: 10/06/2024 HPI/PMH: HPI: Deneise Getty is a 69 y.o. F admitted from home for CHF exacerbation. Rapid response called due to increased WOB, placed on Bipap. MD note states rapid response RN suspected stridor are more consistent with vocal cord dysfunction. S/p laryngoscopy with ENT 1/21 that was notable for L vocal fold paralysis in the paramedian position as well as slight edema in the vocal folds/arytenoids and increased thickness of the L vocal fold.  CT chest showed airspace consolidation in the LLL and inferior LUL compatible with multifocal PNA as well as secretions in the LLL bronchus concerning for aspiration, mild emphysema. BSE in 2018 normal oropharyngeal function, reg/thin rec'd.  PMHx of T2DM, HTN, CKD stage V, anemia of chronic disease, COPD, Wegener's granulomatosis with renal involvement who is admitted for CHF exacerbation. Clinical Impression: Clinical Impression: Pt has a functional oropharyngeal swallow  with no residue or aspiration observed. She has trace, transient penetration of thin liquids that clears upon completion of the swallow (PAS 2, considered to be normal). Pt still endorses the sensation of solids sometimes getting stuck, which is suspected to be more of an esophageal etiology. No further acute SLP f/u indicated. Would resume regular solids and thin liquids at MD discretion pending esophagram results. Factors that may increase risk of adverse event in presence of aspiration Noe & Lianne 2021): Factors that may increase risk of adverse event in presence of aspiration Noe & Lianne 2021): Respiratory or GI disease Recommendations/Plan: Swallowing Evaluation Recommendations Swallowing Evaluation Recommendations Recommendations: PO diet PO Diet Recommendation: Regular; Thin liquids (Level 0) Liquid Administration via: Cup; Straw Medication Administration: Whole meds with liquid Supervision: Patient able to self-feed Swallowing strategies  : Slow rate; Small bites/sips; Follow solids with liquids Postural changes: Position pt fully upright for meals; Stay upright 30-60 min after meals Oral care recommendations: Oral care BID (2x/day) Recommended consults:  Other(comment) (could consider GI as needed pending results of esophagram) Treatment Plan Treatment Plan Treatment recommendations: No treatment recommended at this time Follow-up recommendations: No SLP follow up Functional status assessment: Patient has not had a recent decline in their functional status. Recommendations Recommendations for follow up therapy are one component of a multi-disciplinary discharge planning process, led by the attending physician.  Recommendations may be updated based on patient status, additional functional criteria and insurance authorization. Assessment: Orofacial Exam: Orofacial Exam Oral Cavity - Dentition: Adequate natural dentition; Missing dentition Anatomy: Anatomy: WFL Boluses Administered: Boluses  Administered Boluses Administered: Thin liquids (Level 0); Mildly thick liquids (Level 2, nectar thick); Moderately thick liquids (Level 3, honey thick); Puree  Oral Impairment Domain: Oral Impairment Domain Lip Closure: No labial escape Tongue control during bolus hold: Cohesive bolus between tongue to palatal seal Bolus transport/lingual motion: Slow tongue motion Oral residue: Complete oral clearance Location of oral residue : N/A Initiation of pharyngeal swallow : Pyriform sinuses  Pharyngeal Impairment Domain: Pharyngeal Impairment Domain Soft palate elevation: No bolus between soft palate (SP)/pharyngeal wall (PW) Laryngeal elevation: Complete superior movement of thyroid  cartilage with complete approximation of arytenoids to epiglottic petiole Anterior hyoid excursion: Complete anterior movement Epiglottic movement: Complete inversion Laryngeal vestibule closure: Complete, no air/contrast in laryngeal vestibule Pharyngeal stripping wave : Present - complete Pharyngeal contraction (A/P view only): N/A Pharyngoesophageal segment opening: Complete distension and complete duration, no obstruction of flow Tongue base retraction: No contrast between tongue base and posterior pharyngeal wall (PPW) Pharyngeal residue: Complete pharyngeal clearance Location of pharyngeal residue: N/A  Esophageal Impairment Domain: Esophageal Impairment Domain Esophageal clearance upright position: -- (deferred - pt having esophagram immediately following MBS) Pill: Pill Consistency administered: -- (deferred to esophagram) Penetration/Aspiration Scale Score: Penetration/Aspiration Scale Score 1.  Material does not enter airway: Puree 2.  Material enters airway, remains ABOVE vocal cords then ejected out: Thin liquids (Level 0); Mildly thick liquids (Level 2, nectar thick); Moderately thick liquids (Level 3, honey thick) Compensatory Strategies: Compensatory Strategies Compensatory strategies: Yes Straw: Effective Effective Straw: Thin  liquid (Level 0)   General Information: Caregiver present: No  Diet Prior to this Study: Regular; Thin liquids (Level 0) (temporarily made NPO for procedure but cleared by MD to complete MBS today)   Temperature : Normal   Respiratory Status: WFL   Supplemental O2: None (Room air)   History of Recent Intubation: Yes  Behavior/Cognition: Alert; Cooperative; Pleasant mood Self-Feeding Abilities: Able to self-feed Baseline vocal quality/speech: Dysphonic No data recorded Volitional Swallow: Able to elicit Exam Limitations: No limitations Goal Planning: No data recorded No data recorded No data recorded Patient/Family Stated Goal: none stated Consulted and agree with results and recommendations: Patient Pain: Pain Assessment Pain Assessment: Faces Faces Pain Scale: 0 End of Session: Start Time:SLP Start Time (ACUTE ONLY): 1315 Stop Time: SLP Stop Time (ACUTE ONLY): 1331 Time Calculation:SLP Time Calculation (min) (ACUTE ONLY): 16 min Charges: SLP Evaluations $ SLP Speech Visit: 1 Visit SLP Evaluations $MBS Swallow: 1 Procedure SLP visit diagnosis: SLP Visit Diagnosis: Dysphagia, unspecified (R13.10) Past Medical History: Past Medical History: Diagnosis Date  Acute renal failure (ARF) 12/08/2016  Acute respiratory failure with hypoxia (HCC) 12/08/2016  AKI (acute kidney injury) 12/08/2016  Allergy   Anemia   Asthma with COPD (chronic obstructive pulmonary disease) (HCC) 11/25/2017  CAP (community acquired pneumonia) 12/08/2016  Chronic kidney disease   CKD (chronic kidney disease), stage IV (HCC) 02/11/2017  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  CVA (cerebral vascular accident) (HCC) 07/03/2012  Diffuse pulmonary alveolar hemorrhage   Edema of right lower extremity 03/25/2017  Elevated d-dimer 09/26/2024  Essential hypertension 12/08/2016  Hemoptysis 12/08/2016  High cholesterol   HLD (hyperlipidemia) 12/08/2016  Hx of adenomatous polyp of colon 04/16/2017  Hypertension   stopped taking medications 10 years ago  Hypokalemia  07/02/2012  Intra-alveolar hemorrhage 12/08/2016  Left facial numbness 07/02/2012  Sepsis (HCC) 12/08/2016  Shortness of breath 12/02/2017  Stroke (HCC)   Substance abuse (HCC) 12/08/2016  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  Tobacco abuse 12/08/2016  Type 2 diabetes mellitus without complication, with long-term current use of insulin  (HCC) 02/11/2017  Wegener's granulomatosis with renal involvement (HCC) 12/08/2016 Past Surgical History: Past Surgical History: Procedure Laterality Date  ABDOMINAL HYSTERECTOMY  1985  INSERTION OF ARTERIOVENOUS (AV) ARTEGRAFT ARM Left 10/05/2024  Procedure: INSERTION OF LEFT ARM RADIOCEPHALIC  ARTERIOVENOUS FISTULA;  Surgeon: Gretta Lonni PARAS, MD;  Location: [facility] OR;  Service: Vascular;  Laterality: Left;  INSERTION OF DIALYSIS CATHETER Right 10/05/2024  Procedure: INSERTION OF RIGHT INTERNAL JUGULAR DIALYSIS CATHETER;  Surgeon: Gretta Lonni PARAS, MD;  Location: [facility] OR;  Service: Vascular;  Laterality: Right;  TUNNELED  IR TUNNELED CENTRAL VENOUS CATH [facility] W IMG  09/26/2024 Catherine Aguilar., M.A. CCC-SLP Acute Rehabilitation Services Office: [phone] Secure chat preferred 10/06/2024, 4:06 PM   Medications: Infusions:  vancomycin       Scheduled Medications:  sodium chloride    Intravenous Once   amLODipine   10 mg Oral Daily   budesonide -glycopyrrolate -formoterol   2 puff Inhalation Daily   calcium  acetate  1,334 mg Oral TID WC   carvedilol   12.5 mg Oral BID WC   Chlorhexidine  Gluconate Cloth  6 each Topical Daily   darbepoetin (ARANESP ) injection - DIALYSIS  60 mcg Subcutaneous Q Mon-1800   heparin   5,000 Units Subcutaneous Q8H   hydrALAZINE   100 mg Oral Q8H   insulin  aspart  0-5 Units Subcutaneous QHS   insulin  aspart  0-9 Units Subcutaneous TID WC   pantoprazole   40 mg Oral BID   pravastatin   40 mg Oral QHS    have reviewed scheduled and prn medications.  Physical Exam: General:NAD, comfortable Heart:RRR, s1s2 nl Lungs:clear b/l, no  crackle Abdomen:soft, Non-tender, non-distended Extremities:No edema Dialysis Access: TDC site clean, AV fistula wound clean.  [place] Gamero Prasad Chancellor Vanderloop 10/08/2024,11:26 AM  LOS: 12 days

## 2024-10-08 NOTE — Progress Notes (Signed)
 Patient arrived in the unit from HD, vital signs stable, all needs met, call bell in reach.    10/08/24 1338  Vitals  Temp 98 F (36.7 C)  Temp Source Oral  BP 122/66  MAP (mmHg) 83  BP Location Right Arm  BP Method Automatic  Patient Position (if appropriate) Lying  Pulse Rate 100  Pulse Rate Source Monitor  ECG Heart Rate 100  Resp 19  Level of Consciousness  Level of Consciousness Alert  Oxygen Therapy  SpO2 92 %  O2 Device Room Air

## 2024-10-08 NOTE — Discharge Summary (Addendum)
 "  Family Medicine Teaching [facility] Discharge Summary  Patient name: Catherine Aguilar Medical record number: [id number] Date of birth: 04-06-1956 Age: 69 y.o. Gender: female Date of Admission: 09/25/2024  Date of Discharge: 10/08/2024 Admitting Physician: Rollene FORBES Keeling, MD  Primary Care Provider: Adele Song, MD Consultants: Nephrology, cardiology, vascular surgery, ENT, gastroenterology, infectious disease  Indication for Hospitalization: CHF exacerbation  Brief Hospital Course:  Catherine Aguilar is a 69 y.o.female with a history of HTN, T2DM, HLD, CVA, Stage 5 CKD (baseline creatinine 10-12 not on HD), anemia of chronic kidney disease (baseline hemoglobin 9-11), Wegener's granulomatosis, COPD (not on home oxygen)  who was admitted to the [facility] at [facility] for progressive SOB. Her hospital course is detailed below:  Volume overload Acute kidney injury superimposed on stage 5 chronic kidney disease, not on chronic dialysis  Patient presented to the ED with reports of months of SOB, but felt like it got worse over past few days prior to arrival.  She was previously seen at [facility] ED on 09/23/24 for blood transfusion for a Hgb of 6.4.  At this admission she reported that she had not taken her Torsemide for at least the last day because of urinary leakage/incontinence.  She also endorsed LE edema, L > R, x2 weeks.   CXR showed mild pulmonary edema, cardiomegaly, and aortic atherosclerosis, as well as remote right rib fractures.  Patient was diuresed with IV Lasix  and given steroids to help symptomatically.  Labs significant for Cr 14.8 and proBNP 4245.  Nephrology was consulted and patient was agreeable to proceeding with HD, IR placed temporary HD catheter. She had her first dialysis session 1/17 with good response. HD cath removed 1/22 due to it being the likely source of patient's bacteremia. TDC and AVF/G placed once bacteremia had resolved. New tunneled  catheter has placed by Vasc Surg.  MRSA bacteremia Multifocal pneumonia Rapid response called 1/19 x2 for respiratory distress. Initiated empiric antibiotics with Vancomycin  (1/19 - 2/28) and Zosyn  (1/19-1/20) given CXR with possible PNA, leukocytosis and fever. MRSA swab positive and Bcx later grew MRSA. ID consulted who continued Vancomycin , discontinued Zosyn , and added PO Augmentin  (1/20-1/24) for PNA coverage. Most likely from temp HD catheter which was removed 1/21 to allow patient to clear infection. Patient remained afebrile with improved leukocytosis at the time of discharge. Repeat Bcx showed clearance of MRSA. She was placed on Vancomycin  after the iHD access removal on 09/30/24 - 10/13/24 for the total of 4 weeks course. She will get IV Vanc during her HD session after discharge from the hospital.   Left vocal cord paralysis Rapid response called 1/19 while in dialysis for respiratory distress. Patient unable to tolerate BiPAP and maintained oxygen saturation with 2L [place]. CXR infiltrate concerning for pneumonia but otherwise no acute findings. Patient initiated on empiric abx with Zosyn  2.25 g q8hrs (1/19-1/20) and vancomycin  given CXR findings, leukocytosis and fever. CTA chest later did not show PE but did confirm PNA. Patient continued with increased WOB and unable to tolerate BiPAP though able to maintain oxygen saturation on 2L [place]. Rapid response called again overnight due to worsening respiratory status. ABG reassuring, showed improvement after racemic epi x1 and ativan  x2. Resting comfortably this morning with 2L [place]. Given presentation of sx without acute findings on workup and improvement with anxiolytic, suspect vocal cord dysfunction may be playing a role. ENT was consulted and found L vocal cord paralysis. Possible this is a component of increased WOB, especially given  patient's hx of Wegener's granulomatosis. CT neck with contrast showed medialization of left true vocal cord toward midline  (paralysis vs palsy) w/ intact Vagus n. Per ENT no additional interventions needed at this time unless airway compromise which will likely require intubation then trach. ENT will f/u patient outpatient.   Anemia of chronic kidney failure, stage 5 Patient was seen at [facility] ED for a blood transfusion at a Hgb of 6.4 on 1/14.  Hgb was 8 on arrival this admission.  Anemia workup was consistent with anemia of CKD. Hgb dropped to 6.6, improved to 7.7 after 1u PRBCs. Last blood transfusion was on 10/07/24 Nephrology redosed ESA. Patient was followed with daily CBCs. Dialysis was initiated as above.  Elevated troponin likely 2/2 demand ischemia Initial troponin elevated at 401, down trended to 351 > 331.  Cardiology consulted and recommended repeat echocardiogram which revealed EF 70-75%, hyperdynamic function. Consider slower volume pulls with HD given hyperdynamic function per cardiology. Suspect that troponin elevated in the setting of demand ischemia.   Superficial venous thrombosis of L small saphenous vein Elevated d-dimer D-dimer elevated at 2.08.  Patient was initially placed on a heparin  gtt, however Hgb did drop and this was held.  DVT US  performed and revealed chronic SVT of L small saphenous vein. Heparin  gtt was added back but no indication for chronic anticoagulation per VVS.  COPD (chronic obstructive pulmonary disease) Patient presented with wheezing which she stated on admission was at baseline; also reported that home albuterol  and Breztri  inhalers are ineffective.  She did receive DuoNeb in the ED with improvement and as needed throughout hospital course.  Hypertension Given elevated pressures on admission, cardiology recommended stopping home labetalol  in addition to adding hydralazine  and carvedilol . Home amlodipine  and doxazosin  were continued. Pressures remained stable thereafter.  Other chronic conditions were medically managed with home medications and formulary alternatives as  necessary (T2DM, HLD, CAD)  PCP Follow-up Recommendations: Follow up on DME incontinence supplies. Encourage and reinforce importance of medication adherence. Follow up with VVS in 6 weeks to check maturation of AVF. Consider follow up with cardiology for ischemic testing given elevated troponins 2/2 demand ischemia while admitted. Emphysema noted on CTA, consider low-dose CT for lung cancer screening Will need to reschedule her colonoscopy (missed due to hospitalization). Will need outpatient ENT follow up outpatient for vocal cord paralysis (Dr. Roark, [facility]). May consider trach if persistent respiratory issues. May benefit from medialization procedure if persistent hoarseness.  GI recommends PPI 40 mg BID x2 months, then decrease to once daily.  Discharge Diagnoses/Problem List:  Principal Problem:   MRSA bacteremia Active Problems:   Essential hypertension   Chronic kidney disease (CKD), stage IV (severe) (HCC)   DM2 (diabetes mellitus, type 2) (HCC)   COPD (chronic obstructive pulmonary disease) (HCC)   Anemia in chronic kidney disease (CKD)   Stage 5 chronic kidney disease not on chronic dialysis (HCC)   Cardiorenal syndrome with renal failure   Volume overload   Chronic health problem   Elevated d-dimer   Elevated troponin   Demand ischemia (HCC)   Acute on chronic congestive heart failure (HCC)   Multifocal pneumonia   Shortness of breath   Chronic superficial vein thrombosis involving L small saphenous vein   Bloodstream infection due to central venous catheter   ESRD on hemodialysis (HCC)   Aspiration pneumonia of both lungs (HCC)   Left vocal cord paralysis   Secondary hyperparathyroidism   CAD (coronary artery disease)  Gastroesophageal reflux disease with stricture   Esophageal dysphagia   Abnormal barium swallow  Disposition: Home  Discharge Condition: Stable  Discharge Exam:  Per Dr. Ma documented exam: Cardiovascular:      Rate and Rhythm: Normal rate.  Pulmonary:     Effort: Pulmonary effort is normal.     Breath sounds: Examination of the right-lower field reveals decreased breath sounds. Examination of the left-lower field reveals decreased breath sounds. Decreased breath sounds present.  Abdominal:     Palpations: Abdomen is soft.  Neurological:     Mental Status: She is alert and oriented to person, place, and time.  Psychiatric:        Mood and Affect: Mood normal.        Behavior: Behavior normal.   Significant Procedures:  01/17: temporary HD catheter placed by VVS 01/18: HD initiated 01/21: Fiberoptic laryngoscopy by ENT showing left vocal cord paralysis; temp hemodialysis catheter removed secondary to MRSA bacteremia 01/22: Chronic SVT of L small saphenous vein identified on LE DVT US  01/26: Tunneled HD catheter procedure placed per VVS 01/27: MBS and esophagram 01/28: TEE which showed no vegetation, total 2 units PRBCs transfused (01/23 and 01/28)  Significant Labs and Imaging:  Recent Labs  Lab 10/07/24 0404 10/07/24 1120 10/08/24 0649  WBC 12.5*  --  17.5*  HGB 7.2* 8.7* 8.7*  HCT 22.2* 26.5* 26.4*  PLT 349  --  374   Recent Labs  Lab 10/07/24 0404 10/08/24 0649  NA 132* 133*  K 3.7 4.3  CL 92* 93*  CO2 26 24  GLUCOSE 102* 90  BUN 55* 66*  CREATININE 7.28* 8.86*  CALCIUM  8.9 9.2  PHOS 5.4* 7.3*  ALBUMIN 3.2* 3.2*   01/27: CT soft tissue neck with contrast IMPRESSION: 1. Medialization of the left true vocal cord towards the midline, which could reflect paresis/palsy. No structural abnormality seen along the course of the vagus nerve to explain vocal cord paralysis. 2. Patchy and dense nodular opacities within the visualized left upper lobe, concerning for pneumonia. Associated small left pleural effusion. Radiographic follow-up to resolution following convalescence recommended to ensure these changes resolve. Aortic Atherosclerosis (ICD10-I70.0).  01/27:  Esophagram IMPRESSION: 1. Narrowing at the gastroesophageal junction and the barium tablet was transiently stuck in this area. Findings could be associated with a stricture. 2. Limited evaluation for a hiatal hernia.  01/27: MBS Clinical Impression: Pt has a functional oropharyngeal swallow with no residue or aspiration observed. She has trace, transient penetration of thin liquids that clears upon completion of the swallow (PAS 2, considered to be normal). Pt still endorses the sensation of solids sometimes getting stuck, which is suspected to be more of an esophageal etiology. No further acute SLP f/u indicated. Would resume regular solids and thin liquids at MD discretion pending esophagram results.  01/26: Chest x-ray 1 view IMPRESSION: 1. Right internal jugular venous catheter tip projects over the right atrium. No pneumothorax. 2. Moderate to large left pleural effusion. 3. Bilateral interstitial opacities, left-greater-than-right, likely pulmonary edema. 4. Confluent and patchy left lung opacities may represent superimposed atelectasis or pneumonia.  01/26: Fluoroscopic imaging to confirm placement of tunneled hemodialysis catheter IMPRESSION: Single fluoroscopic image demonstrates placement of tunneled hemodialysis catheter. Please refer to the operative report for further details.  01/19: CT angio chest pulmonary embolism with or without contrast IMPRESSION: 1. No evidence of pulmonary embolism. 2. Airspace consolidation in the left lower lobe and inferior left upper lobe compatible with multifocal pneumonia. There are secretions in  the left lower lobe bronchus concerning for aspiration. 3. Mild emphysema; pulmonary emphysema is an independent risk factor for lung cancer, recommend consideration for evaluation for a low-dose CT lung cancer screening program.  01/19: Chest x-ray 1 view IMPRESSION: 1. Cardiomegaly with mild pulmonary vascular congestion and  mild interstitial edema. 2. Mild to moderate severity left basilar atelectasis and/or infiltrate. 3. Small left pleural effusion.  01/17: Nontunneled central venous hemodialysis catheter placement with ultrasound and fluoroscopic guidance IMPRESSION: Successful placement of a right internal jugular approach 16 cm temporary dialysis catheter with tip terminating with in the superior aspect of the right atrium. The catheter is ready for immediate use.  01/16: Chest x-ray 1 view IMPRESSION: 1. Mild pulmonary edema. 2. Cardiomegaly and aortic atherosclerosis. 3. Remote right rib fractures.  Discharge Medications:  Allergies as of 10/08/2024   No Known Allergies      Medication List     PAUSE taking these medications    aspirin  EC 81 MG tablet Wait to take this until your doctor or other care provider tells you to start again. TAKE 1 TABLET BY MOUTH EVERY DAY   labetalol  300 MG tablet Wait to take this until your doctor or other care provider tells you to start again. Commonly known as: NORMODYNE  Take 300 mg by mouth in the morning and at bedtime.       TAKE these medications    Accu-Chek Guide test strip Generic drug: glucose blood Check blood glucose prior to lantus  administration   Accu-Chek Softclix Lancet Dev Kit Check BG prior to administering insulin    albuterol  (2.5 MG/3ML) 0.083% nebulizer solution Commonly known as: PROVENTIL  Take 3 mLs (2.5 mg total) by nebulization every 6 (six) hours as needed for wheezing or shortness of breath. What changed: Another medication with the same name was changed. Make sure you understand how and when to take each.   albuterol  108 (90 Base) MCG/ACT inhaler Commonly known as: VENTOLIN  HFA INHALE 2 PUFFS BY MOUTH EVERY 4-6 HOURS What changed:  how much to take how to take this when to take this additional instructions   amLODipine  10 MG tablet Commonly known as: NORVASC  Take 10 mg by mouth at bedtime.   azelastine   0.1 % nasal spray Commonly known as: ASTELIN  Place 2 sprays into both nostrils 2 (two) times daily. Use in each nostril as directed What changed:  when to take this reasons to take this additional instructions   B-D UF III MINI PEN NEEDLES 31G X 5 MM Misc Generic drug: Insulin  Pen Needle USE AS DIRECTED   benzonatate  200 MG capsule Commonly known as: TESSALON  TAKE 1 CAPSULE (200 MG TOTAL) BY MOUTH 3 (THREE) TIMES DAILY AS NEEDED FOR COUGH.   Breztri  Aerosphere 160-9-4.8 MCG/ACT Aero inhaler Generic drug: budesonide -glycopyrrolate -formoterol  Inhale 2 puffs into the lungs in the morning and at bedtime.   calcium  acetate 667 MG capsule Commonly known as: PHOSLO  Take 2 capsules (1,334 mg total) by mouth 3 (three) times daily with meals.   carvedilol  12.5 MG tablet Commonly known as: COREG  Take 1 tablet (12.5 mg total) by mouth 2 (two) times daily with a meal.   doxazosin  2 MG tablet Commonly known as: CARDURA  Take 1 tablet (2 mg total) by mouth daily. What changed: when to take this   fluticasone  50 MCG/ACT nasal spray Commonly known as: FLONASE  Place 2 sprays into both nostrils daily.   guaiFENesin  100 MG/5ML liquid Commonly known as: ROBITUSSIN Take 5 mLs by mouth every 4 (four) hours  as needed for cough or to loosen phlegm.   hydrALAZINE  100 MG tablet Commonly known as: APRESOLINE  Take 1 tablet (100 mg total) by mouth every 8 (eight) hours.   Lantus  SoloStar 100 UNIT/ML Solostar Pen Generic drug: insulin  glargine Inject 5 Units into the skin at bedtime.   pantoprazole  40 MG tablet Commonly known as: PROTONIX  Take 1 tablet (40 mg total) by mouth 2 (two) times daily. Take 2 times daily for 2 months, then decrease to once daily.   pravastatin  40 MG tablet Commonly known as: PRAVACHOL  Take 40 mg by mouth at bedtime.   sodium bicarbonate  650 MG tablet Take 2 tablets (1,300 mg total) by mouth 2 (two) times daily.   Spacer/Aero-Holding [employer] Please use  with all inhaled medications   torsemide 100 MG tablet Commonly known as: DEMADEX Take 100 mg by mouth in the morning.   vancomycin  IVPB Inject 750 mg into the vein Every Tuesday,Thursday,and Saturday with dialysis for 20 days. Indication:  MRSA bacteremia  Last Day of Therapy:  10/28/2024 Labs - Sunday/Monday:  CBC/D, BMP, and vancomycin  trough. Labs - Thursday:  BMP and vancomycin  trough Labs - Once weekly: ESR and CRP               Home Infusion Instuctions  (From admission, onward)           Start     Ordered   10/07/24 0000  Home infusion instructions       Question:  Instructions  Answer:  Flushing of vascular access device: 0.9% NaCl pre/post medication administration and prn patency; Heparin  100 u/ml, 5ml for implanted ports and Heparin  10u/ml, 5ml for all other central venous catheters.   10/07/24 1632            Discharge Instructions: Please refer to Patient Instructions section of EMR for full details.  Patient was counseled important signs and symptoms that should prompt return to medical care, changes in medications, dietary instructions, activity restrictions, and follow up appointments.   Follow-Up Appointments: Future Appointments  Date Time Provider Department Center  10/12/2024  2:10 PM Suzen Houston KATHEE ROSALEA [facility]  10/27/2024  9:00 AM Elaine Miriam BRAVO, NP [facility]  10/28/2024  9:00 AM Dea Shiner, MD [facility]  11/18/2024  1:00 PM [facility] 1 [facility]  11/18/2024  2:00 PM [facility] PA [facility]     Follow-up Information     Center, [place] Go on 10/13/2024.   Why: Schedule is Tuesday, Thursday, Saturday wtih 11:30 am start time.  For first appointment, please arrive at 10:45 am to complete paperwork prior to treatment. Contact information: [address] [place] [address] [phone]         [facility] Dept. of [facility] Follow up in 6 week(s).    Specialty: Vascular Surgery Contact information: [address] [place]  [address] [phone]               Lupie Credit, DO 10/08/2024, 5:08 PM PGY-2, [facility]  Upper Level Addendum: I have seen and evaluated this patient along with Dr. Lupie and reviewed the above note, making necessary revisions as appropriate. I agree with the medical decision making and physical exam as noted above.  Lauraine Norse, DO [facility], PGY-2 10/08/24 6:57 PM   "

## 2024-10-08 NOTE — Assessment & Plan Note (Signed)
-   Continue home Breztri  inhaler 2 puffs daily  - Continue DuoNebs BID scheduled and q2h PRN - Continue Benzonatate  200 mg TID PRN for cough

## 2024-10-08 NOTE — Progress Notes (Addendum)
 "    Daily Progress Note Intern Pager: [phone]  Patient name: Catherine Aguilar Medical record number: [id number] Date of birth: [birth year] Age: 69 y.o. Gender: female  Primary Care Provider: Adele Song, MD Consultants: Nephrology, Cardiology, Vasc Surg, ENT, ID, GI Code Status: Full Code.  Pt Overview and Major Events to Date:  1/17 : Admitted, Vas cath placed 1/8 : Starting iHD 1/19-1/20 : RRT x3 d/t inc WOB. Found to have Vocal cord paralysis/airway obstruction 1/21 : Fiberoptic Laryngoscopy by ENT confirmed Left Vocal cord paralysis. + MRSA on Bcx -removed Vasc cath 1/26 : Tunneled Dialysis cath placement w/ Left radiocephalic AV fistula by Vasc Surg 1/28 : EGD w/ dilation , TEE w/ no vegetation    Catherine Aguilar is a 69 YO Female w/ PMH of DMII, HTN, ERSD, Anemia of Chronic Disease, COPD, and  Wegener's granulomatosis with renal involvement.  She initially admitted for CHF exacerbation, however her hospitalization has been c/b worsening kidney fx required iHD, iHD line infection result in MRSA bacteremia, and left vocal cord paralysis work up shows GE stricture required EGD and Dilation w/ GI. Assessment & Plan MRSA bacteremia Aspiration pneumonia of both lungs (HCC) Blood Cx on  1/23 w/ no growth. Elevated WBC from 12.5 yesterday to 17.5 this MA - Continue contact isolation (+ MRSA)  - ID consulted, appreciate recommendations - Continue vancomycin  for 4 weeks (1/19 - 2/18 ) - Completed PO Augmentin  250-125 mg q12hrs (1/20-1/24) for PNA - Daily CBC ESRD on hemodialysis (HCC) Anemia in chronic kidney disease (CKD) Secondary hyperparathyroidism Received blood transfusion yesterday. HgB 8.7 this morning, last dose ESA on 01/25. - Nephrology consulted, appreciate recommendations.  - T/Th/Sat Dialysis - Continue phosphate binder calcium  acetate 667mg  TID with meals - Transfusion threshold <8   - VVS consulted, appreciate recommendations (s/o 01/27)  - Follow up in 6 weeks to  check maturation of AVF - Continue Tylenol  650 mg q6hrs PRN PO or rectal for pain - Avoid nephrotoxic agents - Strict I&Os, daily weights - Daily RFP and CBC - PT/OT consulted  COPD (chronic obstructive pulmonary disease) (HCC) - Continue home Breztri  inhaler 2 puffs daily  - Continue DuoNebs BID scheduled and q2h PRN - Continue Benzonatate  200 mg TID PRN for cough Acute on chronic congestive heart failure (HCC) Essential hypertension CAD (coronary artery disease) Stable AM Weight 78.2 ( 83 kg on admission) TEE on 1/28 LVEF 70-75% - hyperdynamic fx  w/o vegetation  - Continue cardiac monitoring  - Cardiology consulted, Appreciated recommendation  - Continue home Coreg  12.5 mg BID, Hydralazine  100 mg TID, Amlodipine  10 mg Daily, and Doxazosin  2 mg Daily Left vocal cord paralysis CT neck w/ contrast on 01/27 shows medialization of left true vocal cord toward midline ( paralysis vs palsy) w/ intact Vagus n. - ENT consulted, appreciate recommendations  - No intervention at this time - Continue to monitoring symptoms if continues with airway issues then she will need a trach.  - May need medialization if continue w/ hoarseness - ENT will f/u outpatient  Gastroesophageal reflux disease with stricture Esophageal dysphagia Abnormal barium swallow GE stricture found on Esophagram, SLP w/ barium swallow recommended Regular diet w/ thin - GI consulted, appreciated recommendations  - S/p EGD w/ 16 mm dilation  - Continue Protonix  40 mg daily  - Soft diet w/ ADAT with goal of regular diet Chronic health problem DMII: BCG ACHS, Continue SSI  HLD: continue home Pravastatin  40 mg daily Chronic SVT of L small saphenous vein -  Not required AC per vascular  FEN/GI: Soft diet ADAT PPx: SQH Dispo: Pending clinical improvement and PT/OT rec  Subjective:  Rest comfortable in bed this morning. Denies pain or WOB  Objective: Temp:  [97.7 F (36.5 C)-98.9 F (37.2 C)] 98.9 F (37.2 C) (01/29  0807) Pulse Rate:  [71-85] 85 (01/29 0807) Resp:  [14-22] 19 (01/29 0807) BP: (102-147)/(44-69) 119/59 (01/29 0807) SpO2:  [90 %-98 %] 90 % (01/29 0807) Weight:  [78.2 kg] 78.2 kg (01/29 0601)  Physical Exam Cardiovascular:     Rate and Rhythm: Normal rate.  Pulmonary:     Effort: Pulmonary effort is normal.     Breath sounds: Examination of the right-lower field reveals decreased breath sounds. Examination of the left-lower field reveals decreased breath sounds. Decreased breath sounds present.  Abdominal:     Palpations: Abdomen is soft.  Neurological:     Mental Status: She is alert and oriented to person, place, and time.  Psychiatric:        Mood and Affect: Mood normal.        Behavior: Behavior normal.   Left AV graft w/ thrilled CDI  Laboratory: Most recent CBC Lab Results  Component Value Date   WBC 17.5 (H) 10/08/2024   HGB 8.7 (L) 10/08/2024   HCT 26.4 (L) 10/08/2024   MCV 82.2 10/08/2024   PLT 374 10/08/2024   Most recent BMP    Latest Ref Rng & Units 10/08/2024    6:49 AM  BMP  Glucose 70 - 99 mg/dL 90   BUN 8 - 23 mg/dL 66   Creatinine 9.55 - 1.00 mg/dL 1.13   Sodium 864 - 854 mmol/L 133   Potassium 3.5 - 5.1 mmol/L 4.3   Chloride 98 - 111 mmol/L 93   CO2 22 - 32 mmol/L 24   Calcium  8.9 - 10.3 mg/dL 9.2      Catherine Aguilar B, DO 10/08/2024, 8:13 AM  PGY-1, [facility] FPTS Intern pager: [phone], text pages welcome Secure chat group [facility] Teaching Service   "

## 2024-10-08 NOTE — Evaluation (Addendum)
 Occupational Therapy Evaluation Patient Details Name: Catherine Aguilar MRN: [id number] DOB: Apr 10, 1956 Today's Date: 10/08/2024   History of Present Illness   Pt is a 69 y.o. yo female who was initially admitted 1/14 for CHF exacerbation, however her hospitalization has been c/b worsening kidney fx required iHD, iHD line infection result in MRSA bacteremia, and left vocal cord paralysis work up shows GE stricture required EGD and Dilation w/ GI. PMHx: of DMII, HTN, ERSD, Anemia of Chronic Disease, COPD, and  Wegener's granulomatosis with renal involvement.     Clinical Impressions Catherine Aguilar was evaluated s/p the above admission list. She is indep at baseline. Upon evaluation the pt was limited by BLE pain, new unsteady gait and limited activity tolerance. Overall she needed superivsion- CGA for all aspects of ADLs and mobility, no DME needed. Pt will benefit from continued acute OT services and no follow up OT.      If plan is discharge home, recommend the following:   A little help with walking and/or transfers;A little help with bathing/dressing/bathroom;Assistance with cooking/housework;Assist for transportation     Functional Status Assessment   Patient has had a recent decline in their functional status and demonstrates the ability to make significant improvements in function in a reasonable and predictable amount of time.     Equipment Recommendations   None recommended by OT      Precautions/Restrictions   Precautions Precautions: Fall Restrictions Weight Bearing Restrictions Per Provider Order: No     Mobility Bed Mobility Overal bed mobility: Independent                  Transfers Overall transfer level: Modified independent                 General transfer comment: from bed and toilet      Balance Overall balance assessment: Mild deficits observed, not formally tested         ADL either performed or assessed with clinical judgement    ADL Overall ADL's : Needs assistance/impaired Eating/Feeding: Independent   Grooming: Supervision/safety;Standing   Upper Body Bathing: Set up;Sitting   Lower Body Bathing: Supervison/ safety;Sit to/from stand   Upper Body Dressing : Set up;Sitting   Lower Body Dressing: Contact guard assist;Sit to/from stand   Toilet Transfer: Contact guard assist;Ambulation Toilet Transfer Details (indicate cue type and reason): limping gait, pt declined AD Toileting- Clothing Manipulation and Hygiene: Modified independent;Sitting/lateral lean       Functional mobility during ADLs: Contact guard assist General ADL Comments: limited by unsteady gait, pt declined AD. reports BLE pain and limp are new     Vision Baseline Vision/History: 0 No visual deficits Vision Assessment?: No apparent visual deficits     Perception Perception: Within Functional Limits       Praxis Praxis: WFL       Pertinent Vitals/Pain Pain Assessment Pain Assessment: Faces Faces Pain Scale: Hurts little more Pain Location: BLE Pain Descriptors / Indicators: Grimacing, Guarding Pain Intervention(s): Monitored during session, Limited activity within patient's tolerance     Extremity/Trunk Assessment Upper Extremity Assessment Upper Extremity Assessment: Generalized weakness;Difficult to assess due to impaired cognition   Lower Extremity Assessment Lower Extremity Assessment: Defer to PT evaluation   Cervical / Trunk Assessment Cervical / Trunk Assessment: Normal   Communication Communication Communication: No apparent difficulties   Cognition Arousal: Alert Behavior During Therapy: Flat affect Cognition: No family/caregiver present to determine baseline             OT -  Cognition Comments: pt seemingly frustrated, only gave 1 word yes/no answers. difficult to assess cog.                 Following commands: Impaired Following commands impaired: Follows one step commands with increased  time, Follows multi-step commands inconsistently     Cueing  General Comments   Cueing Techniques: Verbal cues  VSS    Home Living Family/patient expects to be discharged to:: Private residence Living Arrangements: Spouse/significant other Available Help at Discharge: Family;Available 24 hours/day Type of Home: House Home Access: Level entry     Home Layout: One level     Bathroom Shower/Tub: Chief Strategy Officer: Standard     Home Equipment: Agricultural Consultant (2 wheels);Cane - single point;BSC/3in1          Prior Functioning/Environment Prior Level of Function : Independent/Modified Independent             Mobility Comments: indep, no AD ADLs Comments: indep, does not drive    OT Problem List: Decreased strength;Decreased range of motion;Decreased activity tolerance;Impaired balance (sitting and/or standing);Decreased cognition;Decreased safety awareness;Decreased knowledge of use of DME or AE;Decreased knowledge of precautions   OT Treatment/Interventions: Self-care/ADL training;Therapeutic exercise;DME and/or AE instruction;Therapeutic activities;Balance training;Patient/family education      OT Goals(Current goals can be found in the care plan section)   Acute Rehab OT Goals Patient Stated Goal: home OT Goal Formulation: With patient Time For Goal Achievement: 10/22/24 Potential to Achieve Goals: Good ADL Goals Additional ADL Goal #1: pt will complete all ADLs independently   OT Frequency:  Min 2X/week       AM-PAC OT 6 Clicks Daily Activity     Outcome Measure Help from another person eating meals?: None Help from another person taking care of personal grooming?: A Little Help from another person toileting, which includes using toliet, bedpan, or urinal?: A Little Help from another person bathing (including washing, rinsing, drying)?: A Little Help from another person to put on and taking off regular upper body clothing?: A  Little Help from another person to put on and taking off regular lower body clothing?: A Little 6 Click Score: 19   End of Session Nurse Communication: Mobility status  Activity Tolerance: Patient tolerated treatment well Patient left: in bed;with call bell/phone within reach  OT Visit Diagnosis: Unsteadiness on feet (R26.81);Other abnormalities of gait and mobility (R26.89);Muscle weakness (generalized) (M62.81);Pain                Time: 1349-1405 OT Time Calculation (min): 16 min Charges:  OT General Charges $OT Visit: 1 Visit OT Evaluation $OT Eval Moderate Complexity: 1 Mod  Catherine Aguilar, OTR/L Acute Rehabilitation Services Office [phone] Secure Chat Communication Preferred   Catherine Aguilar 10/08/2024, 2:59 PM

## 2024-10-09 LAB — SURGICAL PATHOLOGY

## 2024-10-09 NOTE — Discharge Planning (Signed)
 [facility]  Initial Hemodialysis Orders  Dialysis center: [facility]  Patient's name: Catherine Aguilar DOB: 09/12/1955 AKI or ESRD: ESRD  Discharge diagnosis: ESRD 2. HTN  Allergies: Allergies[1]  Date of First Dialysis: 09/26/24 Cause of renal disease: HTN, DM, ANCA vasculitis  Dialysis Prescription: Dialysis Frequency: TIW Tx duration: 4 hours BFR: 400 DFR: 800 EDW: 77kg  Dialyzer: 180NRe UF profile/Sodium modeling?: no Dialysis Bath: 2 K 2 Ca  Dialysis access: Access type: [facility] Date placed: 10/05/24 Surgeon: Dr. Gretta Maturing AVF created 10/05/24 Needle gauge: n/a  In Center Medications: Heparin  Dose: none  Type: n/a VDRA: none-start PRN per protocol Venofer : none-start PRN per protocol Mircera: 100mcg IV q 2 weeks Next dose due: 10/12/24 Sensipar: none-start PRN per protocol  Discharge labs: Hgb: 8.7 K+: 4.3  Ca: 9.2  Phos: 7.3 Alb: 3.2  Please draw routine labs. Additional labs needed: none Additional notes/follow-up: none  Lucie Collet, PA-C 10/09/2024, 12:32 PM  [facility] Pager: [phone]        [1] No Known Allergies

## 2024-10-10 ENCOUNTER — Ambulatory Visit: Payer: Self-pay | Admitting: Gastroenterology

## 2024-10-12 ENCOUNTER — Encounter: Admitting: Internal Medicine

## 2024-10-12 ENCOUNTER — Ambulatory Visit: Payer: Self-pay

## 2024-10-21 ENCOUNTER — Encounter (HOSPITAL_COMMUNITY)

## 2024-10-27 ENCOUNTER — Ambulatory Visit: Admitting: Emergency Medicine

## 2024-10-28 ENCOUNTER — Ambulatory Visit: Admitting: Infectious Diseases

## 2024-11-18 ENCOUNTER — Ambulatory Visit (HOSPITAL_COMMUNITY)

## 2024-11-18 ENCOUNTER — Encounter
